# Patient Record
Sex: Female | Born: 1955 | Race: White | Hispanic: No | Marital: Married | Smoking: Never smoker
Health system: Southern US, Community
[De-identification: ages and names within clinical notes are randomized; demographics above are authoritative.]

## PROBLEM LIST (undated history)

## (undated) DIAGNOSIS — M3500 Sicca syndrome, unspecified: Secondary | ICD-10-CM

## (undated) DIAGNOSIS — E039 Hypothyroidism, unspecified: Secondary | ICD-10-CM

## (undated) DIAGNOSIS — K2970 Gastritis, unspecified, without bleeding: Secondary | ICD-10-CM

## (undated) DIAGNOSIS — E785 Hyperlipidemia, unspecified: Secondary | ICD-10-CM

## (undated) DIAGNOSIS — K859 Acute pancreatitis without necrosis or infection, unspecified: Secondary | ICD-10-CM

## (undated) DIAGNOSIS — F419 Anxiety disorder, unspecified: Secondary | ICD-10-CM

## (undated) DIAGNOSIS — K900 Celiac disease: Secondary | ICD-10-CM

## (undated) DIAGNOSIS — T4145XA Adverse effect of unspecified anesthetic, initial encounter: Secondary | ICD-10-CM

## (undated) DIAGNOSIS — F329 Major depressive disorder, single episode, unspecified: Secondary | ICD-10-CM

## (undated) DIAGNOSIS — G43909 Migraine, unspecified, not intractable, without status migrainosus: Secondary | ICD-10-CM

## (undated) DIAGNOSIS — T8859XA Other complications of anesthesia, initial encounter: Secondary | ICD-10-CM

## (undated) DIAGNOSIS — M797 Fibromyalgia: Secondary | ICD-10-CM

## (undated) DIAGNOSIS — K224 Dyskinesia of esophagus: Secondary | ICD-10-CM

## (undated) DIAGNOSIS — J45909 Unspecified asthma, uncomplicated: Secondary | ICD-10-CM

## (undated) DIAGNOSIS — C73 Malignant neoplasm of thyroid gland: Secondary | ICD-10-CM

## (undated) DIAGNOSIS — J449 Chronic obstructive pulmonary disease, unspecified: Secondary | ICD-10-CM

## (undated) DIAGNOSIS — K589 Irritable bowel syndrome without diarrhea: Secondary | ICD-10-CM

## (undated) DIAGNOSIS — M1990 Unspecified osteoarthritis, unspecified site: Secondary | ICD-10-CM

## (undated) DIAGNOSIS — Z9189 Other specified personal risk factors, not elsewhere classified: Secondary | ICD-10-CM

## (undated) DIAGNOSIS — K219 Gastro-esophageal reflux disease without esophagitis: Secondary | ICD-10-CM

## (undated) DIAGNOSIS — Z1371 Encounter for nonprocreative screening for genetic disease carrier status: Secondary | ICD-10-CM

## (undated) DIAGNOSIS — Z9109 Other allergy status, other than to drugs and biological substances: Secondary | ICD-10-CM

## (undated) DIAGNOSIS — Z803 Family history of malignant neoplasm of breast: Secondary | ICD-10-CM

## (undated) DIAGNOSIS — F32A Depression, unspecified: Secondary | ICD-10-CM

## (undated) HISTORY — PX: BREAST CYST ASPIRATION: SHX578

## (undated) HISTORY — DX: Migraine, unspecified, not intractable, without status migrainosus: G43.909

## (undated) HISTORY — PX: COLONOSCOPY WITH ESOPHAGOGASTRODUODENOSCOPY (EGD): SHX5779

## (undated) HISTORY — PX: NASAL SINUS SURGERY: SHX719

## (undated) HISTORY — DX: Encounter for nonprocreative screening for genetic disease carrier status: Z13.71

## (undated) HISTORY — DX: Other allergy status, other than to drugs and biological substances: Z91.09

## (undated) HISTORY — DX: Unspecified asthma, uncomplicated: J45.909

## (undated) HISTORY — PX: ESOPHAGOGASTRODUODENOSCOPY: SHX1529

## (undated) HISTORY — PX: BLADDER REPAIR: SHX76

## (undated) HISTORY — PX: EYE SURGERY: SHX253

## (undated) HISTORY — DX: Family history of malignant neoplasm of breast: Z80.3

## (undated) HISTORY — PX: THYROID LOBECTOMY: SHX420

## (undated) HISTORY — DX: Chronic obstructive pulmonary disease, unspecified: J44.9

## (undated) HISTORY — PX: TOTAL THYROIDECTOMY: SHX2547

## (undated) HISTORY — DX: Other specified personal risk factors, not elsewhere classified: Z91.89

## (undated) SURGERY — BRONCHOSCOPY, FLEXIBLE
Anesthesia: Moderate Sedation | Laterality: Bilateral

---

## 1987-10-28 HISTORY — PX: CHOLECYSTECTOMY: SHX55

## 2002-10-27 HISTORY — PX: INCONTINENCE SURGERY: SHX676

## 2004-10-27 HISTORY — PX: ABDOMINAL HYSTERECTOMY: SHX81

## 2004-11-25 ENCOUNTER — Ambulatory Visit: Payer: Self-pay | Admitting: Pain Medicine

## 2004-12-03 ENCOUNTER — Ambulatory Visit: Payer: Self-pay | Admitting: Pain Medicine

## 2004-12-17 ENCOUNTER — Ambulatory Visit: Payer: Self-pay | Admitting: Physician Assistant

## 2004-12-30 ENCOUNTER — Emergency Department: Payer: Self-pay | Admitting: Emergency Medicine

## 2005-02-12 ENCOUNTER — Emergency Department (HOSPITAL_COMMUNITY): Admission: EM | Admit: 2005-02-12 | Discharge: 2005-02-12 | Payer: Self-pay | Admitting: Emergency Medicine

## 2005-02-18 ENCOUNTER — Inpatient Hospital Stay: Payer: Self-pay | Admitting: Endocrinology

## 2005-05-13 ENCOUNTER — Emergency Department: Payer: Self-pay | Admitting: Emergency Medicine

## 2005-07-24 ENCOUNTER — Ambulatory Visit: Payer: Self-pay | Admitting: Obstetrics & Gynecology

## 2005-08-04 ENCOUNTER — Ambulatory Visit: Payer: Self-pay | Admitting: Obstetrics & Gynecology

## 2005-09-26 ENCOUNTER — Ambulatory Visit: Payer: Self-pay | Admitting: Pain Medicine

## 2005-10-07 ENCOUNTER — Ambulatory Visit: Payer: Self-pay | Admitting: Pain Medicine

## 2005-10-30 ENCOUNTER — Ambulatory Visit: Payer: Self-pay | Admitting: Physician Assistant

## 2005-12-12 ENCOUNTER — Emergency Department: Payer: Self-pay | Admitting: Emergency Medicine

## 2006-03-04 ENCOUNTER — Ambulatory Visit: Payer: Self-pay | Admitting: General Surgery

## 2006-06-22 ENCOUNTER — Ambulatory Visit: Payer: Self-pay | Admitting: Pain Medicine

## 2006-06-25 ENCOUNTER — Ambulatory Visit: Payer: Self-pay | Admitting: Pain Medicine

## 2006-07-08 ENCOUNTER — Ambulatory Visit: Payer: Self-pay | Admitting: Pain Medicine

## 2006-07-11 ENCOUNTER — Emergency Department (HOSPITAL_COMMUNITY): Admission: EM | Admit: 2006-07-11 | Discharge: 2006-07-11 | Payer: Self-pay | Admitting: Emergency Medicine

## 2006-08-05 ENCOUNTER — Ambulatory Visit: Payer: Self-pay | Admitting: Pain Medicine

## 2006-08-28 ENCOUNTER — Ambulatory Visit: Payer: Self-pay | Admitting: General Surgery

## 2006-11-27 ENCOUNTER — Ambulatory Visit: Payer: Self-pay | Admitting: Gastroenterology

## 2007-02-24 ENCOUNTER — Ambulatory Visit: Payer: Self-pay | Admitting: Pain Medicine

## 2007-02-25 ENCOUNTER — Ambulatory Visit: Payer: Self-pay | Admitting: Pain Medicine

## 2007-03-02 ENCOUNTER — Ambulatory Visit: Payer: Self-pay | Admitting: Pain Medicine

## 2007-07-02 ENCOUNTER — Emergency Department (HOSPITAL_COMMUNITY): Admission: EM | Admit: 2007-07-02 | Discharge: 2007-07-02 | Payer: Self-pay | Admitting: Emergency Medicine

## 2007-08-20 ENCOUNTER — Ambulatory Visit: Payer: Self-pay | Admitting: Physician Assistant

## 2007-09-07 ENCOUNTER — Ambulatory Visit: Payer: Self-pay | Admitting: Pain Medicine

## 2007-09-21 ENCOUNTER — Ambulatory Visit: Payer: Self-pay | Admitting: Physician Assistant

## 2007-10-14 ENCOUNTER — Ambulatory Visit: Payer: Self-pay | Admitting: Obstetrics & Gynecology

## 2007-10-18 ENCOUNTER — Ambulatory Visit: Payer: Self-pay | Admitting: Physician Assistant

## 2007-12-01 ENCOUNTER — Ambulatory Visit: Payer: Self-pay | Admitting: Gastroenterology

## 2007-12-07 ENCOUNTER — Ambulatory Visit: Payer: Self-pay | Admitting: Gastroenterology

## 2008-01-22 ENCOUNTER — Emergency Department (HOSPITAL_COMMUNITY): Admission: EM | Admit: 2008-01-22 | Discharge: 2008-01-23 | Payer: Self-pay | Admitting: Emergency Medicine

## 2008-05-26 ENCOUNTER — Ambulatory Visit: Payer: Self-pay | Admitting: Endocrinology

## 2008-07-26 ENCOUNTER — Ambulatory Visit: Payer: Self-pay | Admitting: Pain Medicine

## 2008-08-01 ENCOUNTER — Ambulatory Visit: Payer: Self-pay | Admitting: Pain Medicine

## 2008-09-05 ENCOUNTER — Ambulatory Visit: Payer: Self-pay | Admitting: Physician Assistant

## 2008-09-14 ENCOUNTER — Ambulatory Visit: Payer: Self-pay | Admitting: Pain Medicine

## 2008-12-12 ENCOUNTER — Emergency Department (HOSPITAL_COMMUNITY): Admission: EM | Admit: 2008-12-12 | Discharge: 2008-12-12 | Payer: Self-pay | Admitting: Emergency Medicine

## 2009-03-20 ENCOUNTER — Ambulatory Visit: Payer: Self-pay | Admitting: Obstetrics & Gynecology

## 2009-04-24 ENCOUNTER — Encounter (HOSPITAL_COMMUNITY): Admission: RE | Admit: 2009-04-24 | Discharge: 2009-05-24 | Payer: Self-pay

## 2009-07-02 ENCOUNTER — Emergency Department (HOSPITAL_COMMUNITY): Admission: EM | Admit: 2009-07-02 | Discharge: 2009-07-03 | Payer: Self-pay | Admitting: Emergency Medicine

## 2009-07-25 ENCOUNTER — Ambulatory Visit: Payer: Self-pay | Admitting: Physician Assistant

## 2009-08-14 ENCOUNTER — Ambulatory Visit: Payer: Self-pay | Admitting: Pain Medicine

## 2009-09-04 ENCOUNTER — Ambulatory Visit: Payer: Self-pay | Admitting: Physician Assistant

## 2010-04-02 ENCOUNTER — Ambulatory Visit: Payer: Self-pay | Admitting: Obstetrics & Gynecology

## 2011-01-24 ENCOUNTER — Emergency Department (HOSPITAL_COMMUNITY)
Admission: EM | Admit: 2011-01-24 | Discharge: 2011-01-24 | Disposition: A | Discharge status: Home / Self Care | Payer: Medicare Other | Attending: Emergency Medicine | Admitting: Emergency Medicine

## 2011-01-24 DIAGNOSIS — M129 Arthropathy, unspecified: Secondary | ICD-10-CM | POA: Insufficient documentation

## 2011-01-24 DIAGNOSIS — IMO0001 Reserved for inherently not codable concepts without codable children: Secondary | ICD-10-CM | POA: Insufficient documentation

## 2011-01-24 DIAGNOSIS — M25559 Pain in unspecified hip: Secondary | ICD-10-CM | POA: Insufficient documentation

## 2011-02-11 LAB — BASIC METABOLIC PANEL
BUN: 16 mg/dL (ref 6–23)
CO2: 23 mEq/L (ref 19–32)
Chloride: 108 mEq/L (ref 96–112)
GFR calc Af Amer: >60 mL/min (ref >60)
GFR calc non Af Amer: >60 mL/min (ref >60)
Potassium: 4 mEq/L (ref 3.5–5.1)

## 2011-02-11 LAB — DIFFERENTIAL
Monocytes Relative: 6 % (ref 3–12)
Neutro Abs: 6.2 10*3/uL (ref 1.7–7.7)
Neutrophils Relative %: 63 % (ref 43–77)

## 2011-02-11 LAB — POCT CARDIAC MARKERS
CKMB, poc: <1 ng/mL — ABNORMAL LOW (ref 1.0–8.0)
Myoglobin, poc: 68.4 ng/mL (ref 12–200)
Troponin i, poc: <0.05 ng/mL (ref 0.00–0.09)

## 2011-02-11 LAB — CBC
MCHC: 34.2 g/dL (ref 30.0–36.0)
Platelets: 301 10*3/uL (ref 150–400)
RDW: 13.4 % (ref 11.5–15.5)

## 2011-02-19 ENCOUNTER — Ambulatory Visit: Payer: Self-pay | Admitting: Pain Medicine

## 2011-04-04 ENCOUNTER — Ambulatory Visit: Payer: Self-pay | Admitting: Obstetrics & Gynecology

## 2011-07-14 ENCOUNTER — Ambulatory Visit: Payer: Self-pay | Admitting: Pain Medicine

## 2011-07-14 ENCOUNTER — Other Ambulatory Visit: Payer: Self-pay | Admitting: Pain Medicine

## 2011-07-17 ENCOUNTER — Ambulatory Visit: Payer: Self-pay | Admitting: Pain Medicine

## 2011-07-21 LAB — CBC
HCT: 37.6
Hemoglobin: 13.1
MCHC: 34.7
MCV: 88.1
Platelets: 293
RBC: 4.27
RDW: 13.5
WBC: 6.7

## 2011-07-21 LAB — DIFFERENTIAL
Basophils Absolute: 0
Basophils Relative: 0
Eosinophils Absolute: 0.3
Eosinophils Relative: 4
Lymphocytes Relative: 37
Lymphs Abs: 2.5
Monocytes Absolute: 0.5
Monocytes Relative: 8
Neutro Abs: 3.4
Neutrophils Relative %: 51

## 2011-07-21 LAB — BASIC METABOLIC PANEL
BUN: 12
Creatinine, Ser: 0.99
GFR calc Af Amer: >60
GFR calc non Af Amer: 59 — ABNORMAL LOW
Potassium: 3.9

## 2011-07-21 LAB — AMYLASE: Amylase: 17 — ABNORMAL LOW

## 2011-07-21 LAB — LIPASE, BLOOD: Lipase: 23

## 2011-08-04 ENCOUNTER — Ambulatory Visit: Payer: Self-pay | Admitting: Pain Medicine

## 2011-08-21 ENCOUNTER — Ambulatory Visit: Payer: Self-pay | Admitting: Gastroenterology

## 2011-09-09 ENCOUNTER — Ambulatory Visit: Payer: Self-pay | Admitting: Gastroenterology

## 2011-09-11 LAB — PATHOLOGY REPORT

## 2012-02-10 ENCOUNTER — Emergency Department (HOSPITAL_COMMUNITY)
Admission: EM | Admit: 2012-02-10 | Discharge: 2012-02-11 | Disposition: A | Discharge status: Home / Self Care | Payer: Medicare Other | Attending: Emergency Medicine | Admitting: Emergency Medicine

## 2012-02-10 ENCOUNTER — Emergency Department (HOSPITAL_COMMUNITY): Payer: Medicare Other

## 2012-02-10 ENCOUNTER — Encounter (HOSPITAL_COMMUNITY): Payer: Self-pay | Admitting: Emergency Medicine

## 2012-02-10 DIAGNOSIS — R109 Unspecified abdominal pain: Secondary | ICD-10-CM | POA: Insufficient documentation

## 2012-02-10 DIAGNOSIS — R51 Headache: Secondary | ICD-10-CM | POA: Insufficient documentation

## 2012-02-10 DIAGNOSIS — K5900 Constipation, unspecified: Secondary | ICD-10-CM | POA: Insufficient documentation

## 2012-02-10 DIAGNOSIS — Z9089 Acquired absence of other organs: Secondary | ICD-10-CM | POA: Insufficient documentation

## 2012-02-10 DIAGNOSIS — R142 Eructation: Secondary | ICD-10-CM | POA: Insufficient documentation

## 2012-02-10 DIAGNOSIS — R141 Gas pain: Secondary | ICD-10-CM | POA: Insufficient documentation

## 2012-02-10 DIAGNOSIS — R10819 Abdominal tenderness, unspecified site: Secondary | ICD-10-CM | POA: Insufficient documentation

## 2012-02-10 HISTORY — DX: Unspecified osteoarthritis, unspecified site: M19.90

## 2012-02-10 HISTORY — DX: Fibromyalgia: M79.7

## 2012-02-10 HISTORY — DX: Celiac disease: K90.0

## 2012-02-10 HISTORY — DX: Hyperlipidemia, unspecified: E78.5

## 2012-02-10 MED ORDER — SODIUM CHLORIDE 0.9 % IV BOLUS (SEPSIS)
1000.0000 mL | Freq: Once | INTRAVENOUS | Status: AC
  Administered 2012-02-10: 1000 mL via INTRAVENOUS

## 2012-02-10 MED ORDER — HYDROMORPHONE HCL PF 1 MG/ML IJ SOLN
1.0000 mg | INTRAMUSCULAR | Status: DC | PRN
  Administered 2012-02-11 (×2): 1 mg via INTRAVENOUS
  Filled 2012-02-10 (×2): qty 1

## 2012-02-10 MED ORDER — ONDANSETRON HCL 4 MG/2ML IJ SOLN
4.0000 mg | Freq: Once | INTRAMUSCULAR | Status: AC
  Administered 2012-02-11: 4 mg via INTRAVENOUS
  Filled 2012-02-10: qty 2

## 2012-02-10 NOTE — ED Notes (Signed)
Pt with constipation, nausea and vomiting.  Pt with complaint of headache as well as abdominal pain.  Pt does have distention

## 2012-02-10 NOTE — ED Provider Notes (Signed)
History     CSN: [id number]  Arrival date & time 02/10/12  2145   First MD Initiated Contact with Patient 02/10/12 2309      Chief Complaint  Patient presents with  . Constipation  . Nausea  . Headache  . Abdominal Pain    (Consider location/radiation/quality/duration/timing/severity/associated sxs/prior treatment) HPI Comments: 56 year old female with a history of celiac sprue as well as Sjogren's syndrome, fibromyalgia, migraine and hyperlipidemia. She presents with a complaint of abdominal pain which started in the last 2 weeks, was associated with constipation however after taking laxatives and enemas she had relief and had voluminous watery diarrhea 2 days ago. Since that time her symptoms have recurred in her abdomen has become very swollen and tender it is associated with nausea but no vomiting, no bowel movements and no flatus. The symptoms are persistent, gradually getting worse and not associated with fevers chills coughing shortness of breath rashes or swelling. She denies any rectal bleeding.  Patient is a 56 y.o. female presenting with constipation, headaches, and abdominal pain. The history is provided by the patient and the spouse.  Constipation  Associated symptoms include abdominal pain and headaches.  Headache   Abdominal Pain The primary symptoms of the illness include abdominal pain.  Additional symptoms associated with the illness include constipation.    Past Medical History  Diagnosis Date  . Celiac sprue   . Thyroid disease   . Cancer     thyroid  . Fibromyalgia   . Arthritis   . Hyperlipemia   . Migraine     Past Surgical History  Procedure Date  . Cholecystectomy   . Abdominal hysterectomy   . Thyroid lobectomy   . Bladder repair     No family history on file.  History  Substance Use Topics  . Smoking status: Never Smoker   . Smokeless tobacco: Not on file  . Alcohol Use: No    OB History    Grav Para Term Preterm Abortions TAB SAB  Ect Mult Living                  Review of Systems  Gastrointestinal: Positive for abdominal pain and constipation.  Neurological: Positive for headaches.  All other systems reviewed and are negative.    Allergies  Aspirin; Vioxx; Celebrex; and Reglan  Home Medications   Current Outpatient Rx  Name Route Sig Dispense Refill  . ALBUTEROL SULFATE HFA 108 (90 BASE) MCG/ACT IN AERS Inhalation Inhale 2 puffs into the lungs every 6 (six) hours as needed.    Rulon Sera 18-103 MCG/ACT IN AERO Inhalation Inhale 2 puffs into the lungs every 6 (six) hours as needed.    . ALPRAZOLAM 0.5 MG PO TABS Oral Take 0.5 mg by mouth 4 (four) times daily as needed. FOR ANXIETY    . AMITRIPTYLINE HCL 25 MG PO TABS Oral Take 25 mg by mouth at bedtime.    Marland Kitchen LACRI-LUBE OP Ophthalmic Apply 1 drop to eye at bedtime.    . ATORVASTATIN CALCIUM 20 MG PO TABS Oral Take 20 mg by mouth every evening.    Marland Kitchen BENZOCAINE 20 % MT GEL Mouth/Throat Use as directed 1 application in the mouth or throat 2 (two) times daily as needed.    Marland Kitchen CALCIUM CARBONATE ANTACID 500 MG PO CHEW Oral Chew 1 tablet by mouth daily as needed.    Marland Kitchen VITAMIN D 1000 UNITS PO TABS Oral Take 2,000 Units by mouth every morning.    Marland Kitchen ESTROGENS,  CONJUGATED 0.625 MG/GM VA CREA Vaginal Place 2.75 g vaginally 3 (three) times a week.    Marland Kitchen ELETRIPTAN HYDROBROMIDE 40 MG PO TABS Oral One tablet by mouth at onset of headache. May repeat in 2 hours if headache persists or recurs. may repeat in 2 hours if necessary    . OMEGA-3 FATTY ACIDS 1000 MG PO CAPS Oral Take 3 g by mouth every morning.    Marland Kitchen GABAPENTIN 600 MG PO TABS Oral Take 600 mg by mouth daily as needed.    Marland Kitchen HYOSCYAMINE SULFATE 0.125 MG PO TBDP Sublingual Place 0.125 mg under the tongue every 4 (four) hours as needed.    Marland Kitchen LEVOTHYROXINE SODIUM 112 MCG PO TABS Oral Take 112 mcg by mouth every morning.    Marland Kitchen MAGNESIUM HYDROXIDE 400 MG/5ML PO SUSP Oral Take by mouth daily as needed.    Marland Kitchen  METAXALONE 800 MG PO TABS Oral Take 800 mg by mouth at bedtime.    . ADULT MULTIVITAMIN W/MINERALS CH Oral Take 1 tablet by mouth every morning.    Marland Kitchen PANTOPRAZOLE SODIUM 40 MG PO TBEC Oral Take 40 mg by mouth every morning.    Marland Kitchen POLYETHYL GLYCOL-PROPYL GLYCOL 0.4-0.3 % OP SOLN Ophthalmic Apply 1 drop to eye daily.    Marland Kitchen POLYETHYLENE GLYCOL 3350 PO POWD Oral Take 17 g by mouth daily.    Marland Kitchen PHILLIPS COLON HEALTH PO Oral Take 1 tablet by mouth every morning.    Marland Kitchen SIMETHICONE 80 MG PO CHEW Oral Chew 80 mg by mouth every 6 (six) hours as needed. FOR RELIEF    . TOPIRAMATE 100 MG PO TABS Oral Take 100 mg by mouth every morning.    . VENLAFAXINE HCL ER 150 MG PO CP24 Oral Take 300 mg by mouth at bedtime.    Marland Kitchen BISACODYL 5 MG PO TBEC Oral Take 1 tablet (5 mg total) by mouth 2 (two) times daily. 14 tablet 0  . MAGNESIUM CITRATE PO SOLN Oral Take 296 mLs by mouth once. OTC 300 mL 0  . PREDNISONE 10 MG PO TABS Oral Take 10 mg by mouth as directed. 6,4,2 AS DIRECTED PER TITRATION DOSE      BP 120/80  Pulse 65  Temp(Src) 97.8 F (36.6 C) (Oral)  Resp 20  Ht 5' 5"  (1.651 m)  Wt 178 lb (80.74 kg)  BMI 29.62 kg/m2  SpO2 97%  Physical Exam  Nursing note and vitals reviewed. Constitutional: She appears well-developed and well-nourished.       Uncomfortable appearing  HENT:  Head: Normocephalic and atraumatic.  Mouth/Throat: No oropharyngeal exudate.       Mucous membranes dehydrated  Eyes: Conjunctivae and EOM are normal. Pupils are equal, round, and reactive to light. Right eye exhibits no discharge. Left eye exhibits no discharge. No scleral icterus.  Neck: Normal range of motion. Neck supple. No JVD present. No thyromegaly present.  Cardiovascular: Normal rate, regular rhythm, normal heart sounds and intact distal pulses.  Exam reveals no gallop and no friction rub.   No murmur heard. Pulmonary/Chest: Effort normal and breath sounds normal. No respiratory distress. She has no wheezes. She has no  rales.  Abdominal: Soft. She exhibits distension. She exhibits no mass. There is tenderness ( diffuse mild tenderness without guarding or peritoneal signs. No focal tenderness to the right lower quadrant, no rebound). There is no rebound and no guarding.       Decreased bowel sounds  Musculoskeletal: Normal range of motion. She exhibits no edema and  no tenderness.  Lymphadenopathy:    She has no cervical adenopathy.  Neurological: She is alert. Coordination normal.  Skin: Skin is warm and dry. No rash noted. No erythema.  Psychiatric: She has a normal mood and affect. Her behavior is normal.    ED Course  Procedures (including critical care time)  Labs Reviewed  COMPREHENSIVE METABOLIC PANEL - Abnormal; Notable for the following:    Total Bilirubin 0.2 (*)    GFR calc non Af Amer 74 (*)    GFR calc Af Amer 86 (*)    All other components within normal limits  URINALYSIS, ROUTINE W REFLEX MICROSCOPIC - Abnormal; Notable for the following:    Color, Urine STRAW (*)    Specific Gravity, Urine <1.005 (*)    All other components within normal limits  CBC  DIFFERENTIAL  LIPASE, BLOOD   Ct Abdomen Pelvis W Contrast  02/11/2012  *RADIOLOGY REPORT*  Clinical Data: Abdominal pain and distention.  Rule out obstruction.  Constipation, nausea and headache.  CT ABDOMEN AND PELVIS WITH CONTRAST  Technique:  Multidetector CT imaging of the abdomen and pelvis was performed following the standard protocol during bolus administration of intravenous contrast.  Contrast: 127m OMNIPAQUE IOHEXOL 300 MG/ML  SOLN  Comparison: CT abdomen pelvis 02/12/2005  Findings: The lung bases are clear.  Patient is status post cholecystectomy.  There is chronic intra and extrahepatic biliary ductal dilatation, unchanged compared to a CT of 2006.  The liver enhances normally.  No focal liver lesion.  The spleen, adrenal glands, and kidneys are within normal limits. There is some fatty replacement of the pancreas.  No  pancreatic mass or inflammatory changes seen.  Slight haziness in the central small bowel mesentery with a few mesenteric lymph nodes is less prominent compared to the study of 2006.  There is a prominent amount of stool in the colon.  Colon and small bowel loops are normal in caliber.  No bowel wall thickening is seen.  The abdominal aorta is normal in caliber contains scattered atherosclerotic calcification.  No pathologic lymphadenopathy.  No ascites.  Urinary bladder is decompressed and unremarkable. Hysterectomy.  No adnexal mass.  Two surgical clips in the anterior pelvis noted.  No acute bony abnormality.  IMPRESSION:  1.  Prominent amount of stool in the colon.  Question if the patient could have a history of constipation. 2.  Negative for bowel obstruction. 3.  Slight haziness in the central small bowel mesentery with a few small mesenteric lymph nodes noted.  This finding is less prominent compared to 2006, and likely chronic.  This can be seen in the setting of mesenteric lymphadenitis.  Other causes such as lymphoma are felt to be much less likely given the chronicity since 2006, and interval decrease over time. 4.  Stable intra and extrahepatic biliary ductal dilatation in this patient status cholecystectomy.  Original Report Authenticated By: SCurlene Dolphin M.D.     1. Abdominal pain   2. Constipation       MDM  The patient does have a history of cholecystectomy, hysterectomy and is scheduled for a CT scan in the morning at the request of her doctor to rule out other signs of abdominal problems. Due to her increased pain and nausea this evening we'll pursue the workup at this time instead, intravenous medications, fluids, CT scan.  CT scan reviewed by myself and in agreement with radiology I agree that there is constipation and had a stool burden but no other significant acute  findings.  I have personally reviewed the lab work and it shows that there is no dehydration with a specific  gravity of 1.005, normal blood counts, normal compressive metabolic panel.  These findings were discussed with the patient has expressed her understanding and will go home to use medications for constipation and followup with her doctor.  Discharge Prescriptions include:  #1 magnesium citrate #2 Dulcolax      Johnna Acosta, MD 02/11/12 [phone]

## 2012-02-10 NOTE — ED Notes (Signed)
Into room to see patient. States she has had abdominal swelling x 2 weeks but got worse today. Abdomen very distended. Hypoactive bowel sounds. Has been nauseated but no vomiting. Diffuse tenderness. To bathroom to obtain urine specimen clean catch. Family with patient.

## 2012-02-11 LAB — COMPREHENSIVE METABOLIC PANEL
ALT: 30 U/L (ref 0–35)
AST: 19 U/L (ref 0–37)
Albumin: 3.8 g/dL (ref 3.5–5.2)
Alkaline Phosphatase: 102 U/L (ref 39–117)
GFR calc Af Amer: 86 mL/min — ABNORMAL LOW (ref >90)
Glucose, Bld: 89 mg/dL (ref 70–99)
Potassium: 3.7 mEq/L (ref 3.5–5.1)
Sodium: 137 mEq/L (ref 135–145)
Total Protein: 7.6 g/dL (ref 6.0–8.3)

## 2012-02-11 LAB — URINALYSIS, ROUTINE W REFLEX MICROSCOPIC
Bilirubin Urine: NEGATIVE
Glucose, UA: NEGATIVE mg/dL
Hgb urine dipstick: NEGATIVE
Ketones, ur: NEGATIVE mg/dL
Specific Gravity, Urine: <1.005 — ABNORMAL LOW (ref 1.005–1.030)
pH: 6 (ref 5.0–8.0)

## 2012-02-11 LAB — CBC
MCH: 29.5 pg (ref 26.0–34.0)
Platelets: 309 10*3/uL (ref 150–400)
RBC: 4.64 MIL/uL (ref 3.87–5.11)
RDW: 14.2 % (ref 11.5–15.5)
WBC: 10.1 10*3/uL (ref 4.0–10.5)

## 2012-02-11 LAB — DIFFERENTIAL
Basophils Absolute: 0 10*3/uL (ref 0.0–0.1)
Eosinophils Absolute: 0.3 10*3/uL (ref 0.0–0.7)
Lymphs Abs: 3.7 10*3/uL (ref 0.7–4.0)
Neutrophils Relative %: 54 % (ref 43–77)

## 2012-02-11 MED ORDER — BISACODYL 5 MG PO TBEC: 5.0000 mg | DELAYED_RELEASE_TABLET | Freq: Two times a day (BID) | ORAL | Status: AC

## 2012-02-11 MED ORDER — ONDANSETRON HCL 4 MG/2ML IJ SOLN
4.0000 mg | Freq: Once | INTRAMUSCULAR | Status: AC
  Administered 2012-02-11: 4 mg via INTRAVENOUS
  Filled 2012-02-11: qty 2

## 2012-02-11 MED ORDER — IOHEXOL 300 MG/ML  SOLN
100.0000 mL | Freq: Once | INTRAMUSCULAR | Status: AC | PRN
  Administered 2012-02-11: 100 mL via INTRAVENOUS

## 2012-02-11 MED ORDER — MAGNESIUM CITRATE PO SOLN: 296.0000 mL | Freq: Once | ORAL | Status: AC

## 2012-02-11 NOTE — ED Notes (Signed)
Resting sitting up in bed. No distress. Equal chest rise and fall, regular, equal. Pain 3\10. No nausea. Will continue to monitor.

## 2012-02-11 NOTE — ED Notes (Signed)
Patient ambulatory with steady gait to bathroom. Consulted with MD regarding pain medication. States can given another dose dilaudid 70m. Nausea coming back as well.

## 2012-02-11 NOTE — ED Notes (Signed)
IV fluids not running properly via gravity or pump. Very positional. Brenda Normal, RN at bedside to attempt second line.

## 2012-02-11 NOTE — ED Notes (Signed)
Patient back to room from radiology/ pain 2/10. Denies nausea. Denies any needs at this time. Notified awaiting test results. Verbalized understanding. Family with patient. Call bell at bedside. Bed in low position and locked with side rails up.

## 2012-02-11 NOTE — ED Notes (Signed)
Remains resting sitting up in bed. Pain 3\10 at this time. States nausea is ok. Denies any needs. Call bell within reach. Family with patient. Will continue to monitor.

## 2012-02-11 NOTE — ED Notes (Signed)
Pain 3\10. Nausea is better. Resting comfortably in bed. No distress. Equal chest rise and fall, regular, unlabored. Normal saline infusing in left ac at 500 cc\hr. Infusing well with no signs of infiltration. Call bell within reach. Bed in low position and locked with side rails up. Family with patient.

## 2012-02-11 NOTE — ED Notes (Signed)
Resting in bed sitting up. Pain 2\10. Denies nausea. Denies any needs at this time. Bed in low position and locked with side rails up. No distress. Family with patient. Will continue to monitor.

## 2012-02-11 NOTE — ED Notes (Signed)
Patient states pain is 4\10 and would like something else for pain. Denies any other needs. Call bell and family at bedside.

## 2012-02-11 NOTE — ED Notes (Signed)
Patient to radiology via stretcher

## 2012-02-11 NOTE — Discharge Instructions (Signed)
You have been diagnosed with undifferentiated abdominal pain.  Abdominal pain can be caused by many things. Your caregiver evaluates the seriousness of your pain by an examination and possibly blood or urine tests and imaging (CT scan, x-rays, ultrasound). Many cases can be observed and treated at home after initial evaluation in the emergency department. Even though you are being discharged home, abdominal pain can be unpredictable. Therefore, you need a repeat exam if your pain does not resolve, returns, or worsens. Most patient's with abdominal pain do not need to be admitted to the hospital or have surgery, but serious problems like appendicitis and gallbladder attacks can start out as nonspecific pain. Many abdominal conditions cannot be diagnosed in 1 visit, so followup evaluations are very important.  Seek immediate medical attention if:  *The pain does not go away or becomes severe. *Temperature above 101 develops *Repeated vomiting occurs(multiple episodes) *The pain becomes localized to portions of the abdomen. The right side could possibly be appendicitis. In an adult, the left lower portion of the abdomen could be colitis or diverticulitis. *Blood is being passed in stools or vomit *Return also if you develop chest pain, difficulty breathing, dizziness or fainting, or become confused poorly responsive or inconsolable (young children).     If you do not have a physician, you should reference the below phone numbers and call in the morning to establish follow up care.  RESOURCE GUIDE  Dental Problems  Patients with Medicaid: [employer] [facility] [employer] Phone:  [phone]                                                  Phone:  [phone]  If unable to pay or uninsured, contact:  [facility] or [facility] to become qualified for the adult dental  clinic.  Chronic Pain Problems Contact [facility]  [phone] Patients need to be referred by their primary care doctor.  Insufficient Money for Medicine Contact [employer]:  call "211" or [place] [phone].  No Primary Care Doctor Call [facility]  [phone] Other agencies that provide inexpensive medical care    [place]  [phone]    [facility]  [place]  [phone]    [facility]  [phone]    [facility]  [place]  [place]  [phone] [place]   [phone] (emergency services [phone])  Substance Abuse [facility]  [phone] [facility] [phone] [facility] [place] [phone] [facility] [phone] Residential & Outpatient Substance Abuse Program  [phone]  Abuse/Neglect [place] [phone] [place] [phone] (After Hours)  Emergency [place] [phone]  [facility] [place] [phone] [facility]  Services [phone]  MRSA Hotline #:   912-547-3050    [facility] Dept. [address]      [place] Phone:  [phone]                                   Phone:  [phone]                 Phone:  [place] Phone:  [place] [phone] (After Hours)

## 2012-02-11 NOTE — ED Notes (Signed)
MD at bedside to speak with patient about plan of care and test results.

## 2013-01-21 ENCOUNTER — Emergency Department (HOSPITAL_COMMUNITY): Payer: Medicare Other

## 2013-01-21 ENCOUNTER — Encounter (HOSPITAL_COMMUNITY): Payer: Self-pay | Admitting: *Deleted

## 2013-01-21 ENCOUNTER — Emergency Department (HOSPITAL_COMMUNITY)
Admission: EM | Admit: 2013-01-21 | Discharge: 2013-01-22 | Disposition: A | Discharge status: Home / Self Care | Payer: Medicare Other | Attending: Emergency Medicine | Admitting: Emergency Medicine

## 2013-01-21 DIAGNOSIS — G43909 Migraine, unspecified, not intractable, without status migrainosus: Secondary | ICD-10-CM | POA: Insufficient documentation

## 2013-01-21 DIAGNOSIS — IMO0002 Reserved for concepts with insufficient information to code with codable children: Secondary | ICD-10-CM | POA: Insufficient documentation

## 2013-01-21 DIAGNOSIS — E079 Disorder of thyroid, unspecified: Secondary | ICD-10-CM | POA: Insufficient documentation

## 2013-01-21 DIAGNOSIS — Z8739 Personal history of other diseases of the musculoskeletal system and connective tissue: Secondary | ICD-10-CM | POA: Insufficient documentation

## 2013-01-21 DIAGNOSIS — Z79899 Other long term (current) drug therapy: Secondary | ICD-10-CM | POA: Insufficient documentation

## 2013-01-21 DIAGNOSIS — R1013 Epigastric pain: Secondary | ICD-10-CM | POA: Insufficient documentation

## 2013-01-21 DIAGNOSIS — R42 Dizziness and giddiness: Secondary | ICD-10-CM | POA: Insufficient documentation

## 2013-01-21 DIAGNOSIS — E785 Hyperlipidemia, unspecified: Secondary | ICD-10-CM | POA: Insufficient documentation

## 2013-01-21 DIAGNOSIS — Z90710 Acquired absence of both cervix and uterus: Secondary | ICD-10-CM | POA: Insufficient documentation

## 2013-01-21 DIAGNOSIS — Z87442 Personal history of urinary calculi: Secondary | ICD-10-CM | POA: Insufficient documentation

## 2013-01-21 DIAGNOSIS — Z85850 Personal history of malignant neoplasm of thyroid: Secondary | ICD-10-CM | POA: Insufficient documentation

## 2013-01-21 DIAGNOSIS — Z8719 Personal history of other diseases of the digestive system: Secondary | ICD-10-CM | POA: Insufficient documentation

## 2013-01-21 DIAGNOSIS — R109 Unspecified abdominal pain: Secondary | ICD-10-CM

## 2013-01-21 HISTORY — DX: Acute pancreatitis without necrosis or infection, unspecified: K85.90

## 2013-01-21 LAB — COMPREHENSIVE METABOLIC PANEL
AST: 21 U/L (ref 0–37)
Albumin: 4 g/dL (ref 3.5–5.2)
Alkaline Phosphatase: 125 U/L — ABNORMAL HIGH (ref 39–117)
BUN: 18 mg/dL (ref 6–23)
CO2: 24 mEq/L (ref 19–32)
Chloride: 103 mEq/L (ref 96–112)
Creatinine, Ser: 0.98 mg/dL (ref 0.50–1.10)
GFR calc non Af Amer: 63 mL/min — ABNORMAL LOW (ref >90)
Potassium: 4.2 mEq/L (ref 3.5–5.1)
Total Bilirubin: 0.3 mg/dL (ref 0.3–1.2)

## 2013-01-21 LAB — URINALYSIS, ROUTINE W REFLEX MICROSCOPIC
Glucose, UA: NEGATIVE mg/dL
Hgb urine dipstick: NEGATIVE
Ketones, ur: NEGATIVE mg/dL
Protein, ur: NEGATIVE mg/dL
Urobilinogen, UA: 0.2 mg/dL (ref 0.0–1.0)

## 2013-01-21 LAB — CBC WITH DIFFERENTIAL/PLATELET
Basophils Absolute: 0 10*3/uL (ref 0.0–0.1)
Basophils Relative: 0 % (ref 0–1)
HCT: 40.7 % (ref 36.0–46.0)
Hemoglobin: 13.5 g/dL (ref 12.0–15.0)
Lymphocytes Relative: 31 % (ref 12–46)
Monocytes Absolute: 0.5 10*3/uL (ref 0.1–1.0)
Monocytes Relative: 6 % (ref 3–12)
Neutro Abs: 4.9 10*3/uL (ref 1.7–7.7)
Neutrophils Relative %: 58 % (ref 43–77)
RBC: 4.52 MIL/uL (ref 3.87–5.11)
WBC: 8.5 10*3/uL (ref 4.0–10.5)

## 2013-01-21 LAB — LIPASE, BLOOD: Lipase: 15 U/L (ref 11–59)

## 2013-01-21 LAB — AMYLASE: Amylase: 14 U/L (ref 0–105)

## 2013-01-21 MED ORDER — SODIUM CHLORIDE 0.9 % IV SOLN
1000.0000 mL | Freq: Once | INTRAVENOUS | Status: AC
  Administered 2013-01-21: 1000 mL via INTRAVENOUS

## 2013-01-21 MED ORDER — SODIUM CHLORIDE 0.9 % IV SOLN: 1000.0000 mL | INTRAVENOUS | Status: DC

## 2013-01-21 MED ORDER — ONDANSETRON HCL 4 MG/2ML IJ SOLN
4.0000 mg | Freq: Once | INTRAMUSCULAR | Status: AC
  Administered 2013-01-21: 4 mg via INTRAVENOUS
  Filled 2013-01-21: qty 2

## 2013-01-21 MED ORDER — HYDROMORPHONE HCL PF 1 MG/ML IJ SOLN
1.0000 mg | Freq: Once | INTRAMUSCULAR | Status: AC
  Administered 2013-01-21: 1 mg via INTRAVENOUS
  Filled 2013-01-21: qty 1

## 2013-01-21 MED ORDER — HYDROCODONE-ACETAMINOPHEN 5-325 MG PO TABS: 1.0000 | ORAL_TABLET | ORAL | Status: DC | PRN

## 2013-01-21 MED ORDER — HYDROMORPHONE HCL PF 1 MG/ML IJ SOLN
1.0000 mg | Freq: Once | INTRAMUSCULAR | Status: DC
  Filled 2013-01-21 (×2): qty 1

## 2013-01-21 NOTE — ED Notes (Signed)
Pt states abdominal pain x3 days, states she has has pancreatitis in the past. States she has "stomach attacks" from time to time when she eats certain foods as she has celiacs disease. States 2 days ago she drank a bottle of mag citrate to clean herself out.

## 2013-01-21 NOTE — ED Notes (Signed)
abd pain, took citrate of magnesia on Wednesday,  Took some of husbands  Pain med today .  Hx of pancreatitis  Nausea, no vomiting,

## 2013-01-21 NOTE — ED Provider Notes (Signed)
History    This chart was scribed for Susan Apo, MD by Shona Needles, ED Scribe. The patient was seen in room APA09/APA09. Patient's care was started at 2013.  CSN: [id number]  Arrival date & time 01/21/13  2013   None     Chief Complaint  Patient presents with  . Abdominal Pain   Patient is a 57 y.o. female presenting with abdominal pain. The history is provided by the patient. No language interpreter was used.  Abdominal Pain   Susan [name] is a 57 y.o. female with h/o Migraine, pancreatitis, and cancer, who presents to the Emergency Department complaining of 1 week ofgradual onset, intermittent severe diffuse abdominal pain with after eating. Pain is described as "solid excruciating ache," similar to that of pancreatitis, and mildly radiating posteriorly. Pt reports multiple episodes throughout the week that occurred 2 hours after eating. The first episode subsided after taking Magnesium sulfate. The last episode began 6 hours ago, and included associated light headedness. No fever, earache, sore throat, cough, chest pain, difficulty urinating, rash, or syncope. Surgical hx includes hysterectomy, thyroid lobectomy, and cholecystectomy. Current medications include Minocycline which is her "4th cycle of antibiotics."  PCP listed as Dr. Ronnald Collum   Past Medical History  Diagnosis Date  . Celiac sprue   . Thyroid disease   . Fibromyalgia   . Arthritis   . Hyperlipemia   . Migraine   . Cancer     thyroid  . Pancreatitis     Past Surgical History  Procedure Laterality Date  . Cholecystectomy    . Abdominal hysterectomy    . Thyroid lobectomy    . Bladder repair      History reviewed. No pertinent family history.  History  Substance Use Topics  . Smoking status: Never Smoker   . Smokeless tobacco: Not on file  . Alcohol Use: No    OB History   Grav Para Term Preterm Abortions TAB SAB Ect Mult Living                  Review of Systems  Gastrointestinal:  Positive for abdominal pain.  Neurological: Positive for light-headedness.  All other systems reviewed and are negative.    Allergies  Aspirin; Vioxx; Celebrex; and Metoclopramide hcl  Home Medications   Current Outpatient Rx  Name  Route  Sig  Dispense  Refill  . ALPRAZolam (XANAX) 0.5 MG tablet   Oral   Take 0.5 mg by mouth 4 (four) times daily as needed. FOR ANXIETY         . amitriptyline (ELAVIL) 25 MG tablet   Oral   Take 25 mg by mouth at bedtime.         Marland Kitchen atorvastatin (LIPITOR) 20 MG tablet   Oral   Take 20 mg by mouth every evening.         . benzocaine (ORAJEL MOUTH-AID) 20 % GEL   Mouth/Throat   Use as directed 1 application in the mouth or throat at bedtime.          . cholecalciferol (VITAMIN D) 1000 UNITS tablet   Oral   Take 2,000 Units by mouth every morning.         . conjugated estrogens (PREMARIN) vaginal cream   Vaginal   Place 2.75 g vaginally 3 (three) times a week.         . eletriptan (RELPAX) 40 MG tablet   Oral   One tablet by mouth at onset of headache.  May repeat in 2 hours if headache persists or recurs. may repeat in 2 hours if necessary         . fish oil-omega-3 fatty acids 1000 MG capsule   Oral   Take 1 g by mouth at bedtime.          . fluticasone (FLONASE) 50 MCG/ACT nasal spray   Nasal   Place 2 sprays into the nose daily.         Marland Kitchen gabapentin (NEURONTIN) 600 MG tablet   Oral   Take 600 mg by mouth at bedtime.          . hyoscyamine (NULEV) 0.125 MG TBDP   Sublingual   Place 0.125 mg under the tongue every 4 (four) hours as needed.         Marland Kitchen levothyroxine (SYNTHROID, LEVOTHROID) 112 MCG tablet   Oral   Take 112 mcg by mouth every morning.          . Multiple Vitamin (MULITIVITAMIN WITH MINERALS) TABS   Oral   Take 1 tablet by mouth every morning.          . pantoprazole (PROTONIX) 40 MG tablet   Oral   Take 40 mg by mouth every morning.          Vladimir Faster Glycol-Propyl Glycol  (SYSTANE) 0.4-0.3 % SOLN   Ophthalmic   Apply 1 drop to eye 2 (two) times daily.          . Probiotic Product (PHILLIPS COLON HEALTH PO)   Oral   Take 1 tablet by mouth every morning.          . simethicone (GAS-X) 80 MG chewable tablet   Oral   Chew 80 mg by mouth every 6 (six) hours as needed. FOR RELIEF         . topiramate (TOPAMAX) 100 MG tablet   Oral   Take 100 mg by mouth every morning.          . venlafaxine XR (EFFEXOR-XR) 150 MG 24 hr capsule   Oral   Take 300 mg by mouth at bedtime.           BP 101/76  Pulse 85  Temp(Src) 98.2 F (36.8 C) (Oral)  Resp 20  Ht 5' 5"  (1.651 m)  Wt 180 lb (81.647 kg)  BMI 29.95 kg/m2  SpO2 99%  Physical Exam  Nursing note and vitals reviewed. Constitutional: She is oriented to person, place, and time. She appears well-developed and well-nourished. No distress.  HENT:  Head: Normocephalic and atraumatic.  Eyes: Conjunctivae and EOM are normal.  Neck: Neck supple. No tracheal deviation present.  Cardiovascular: Normal rate.   Pulmonary/Chest: Effort normal and breath sounds normal. No respiratory distress.  Abdominal: Soft. Bowel sounds are normal. She exhibits distension. She exhibits no mass. There is no guarding.  Epigastric with radiation to RLQ.  Musculoskeletal: Normal range of motion.  Lymphadenopathy:    She has no cervical adenopathy.  Neurological: She is alert and oriented to person, place, and time. No sensory deficit.  Skin: Skin is dry.  Psychiatric: She has a normal mood and affect. Her behavior is normal.    ED Course  Procedures DIAGNOSTIC STUDIES: Oxygen Saturation is 99% on room air, normal by my interpretation.    COORDINATION OF CARE: 21:43- Evaluated Pt. Pt is awake, alert, and without distress. 21:49- Patient understand and agree with initial ED impression and plan with expectations set for ED visit. 21:57- Ordered DG Abd  Acute W/Chest 1 time imaging.  11:10 PM Results for orders  placed during the hospital encounter of 01/21/13  AMYLASE      Result Value Range   Amylase 14  0 - 105 U/L  LIPASE, BLOOD      Result Value Range   Lipase 15  11 - 59 U/L  CBC WITH DIFFERENTIAL      Result Value Range   WBC 8.5  4.0 - 10.5 K/uL   RBC 4.52  3.87 - 5.11 MIL/uL   Hemoglobin 13.5  12.0 - 15.0 g/dL   HCT 40.7  36.0 - 46.0 %   MCV 90.0  78.0 - 100.0 fL   MCH 29.9  26.0 - 34.0 pg   MCHC 33.2  30.0 - 36.0 g/dL   RDW 14.6  11.5 - 15.5 %   Platelets 310  150 - 400 K/uL   Neutrophils Relative 58  43 - 77 %   Neutro Abs 4.9  1.7 - 7.7 K/uL   Lymphocytes Relative 31  12 - 46 %   Lymphs Abs 2.6  0.7 - 4.0 K/uL   Monocytes Relative 6  3 - 12 %   Monocytes Absolute 0.5  0.1 - 1.0 K/uL   Eosinophils Relative 5  0 - 5 %   Eosinophils Absolute 0.4  0.0 - 0.7 K/uL   Basophils Relative 0  0 - 1 %   Basophils Absolute 0.0  0.0 - 0.1 K/uL  COMPREHENSIVE METABOLIC PANEL      Result Value Range   Sodium 138  135 - 145 mEq/L   Potassium 4.2  3.5 - 5.1 mEq/L   Chloride 103  96 - 112 mEq/L   CO2 24  19 - 32 mEq/L   Glucose, Bld 90  70 - 99 mg/dL   BUN 18  6 - 23 mg/dL   Creatinine, Ser 0.98  0.50 - 1.10 mg/dL   Calcium 9.7  8.4 - 10.5 mg/dL   Total Protein 8.0  6.0 - 8.3 g/dL   Albumin 4.0  3.5 - 5.2 g/dL   AST 21  0 - 37 U/L   ALT 22  0 - 35 U/L   Alkaline Phosphatase 125 (*) 39 - 117 U/L   Total Bilirubin 0.3  0.3 - 1.2 mg/dL   GFR calc non Af Amer 63 (*) >90 mL/min   GFR calc Af Amer 73 (*) >90 mL/min  URINALYSIS, ROUTINE W REFLEX MICROSCOPIC      Result Value Range   Color, Urine YELLOW  YELLOW   APPearance CLEAR  CLEAR   Specific Gravity, Urine 1.010  1.005 - 1.030   pH 6.0  5.0 - 8.0   Glucose, UA NEGATIVE  NEGATIVE mg/dL   Hgb urine dipstick NEGATIVE  NEGATIVE   Bilirubin Urine NEGATIVE  NEGATIVE   Ketones, ur NEGATIVE  NEGATIVE mg/dL   Protein, ur NEGATIVE  NEGATIVE mg/dL   Urobilinogen, UA 0.2  0.0 - 1.0 mg/dL   Nitrite NEGATIVE  NEGATIVE   Leukocytes, UA  NEGATIVE  NEGATIVE   Dg Abd Acute W/chest  01/21/2013  *RADIOLOGY REPORT*  Clinical Data: Abdominal pain and distention, nausea and vomiting for 2 days  ACUTE ABDOMEN SERIES (ABDOMEN 2 VIEW & CHEST 1 VIEW)  Comparison: 01/22/2008 abdominal radiographs, 12/12/2008 chest radiograph  Findings: Normal heart size, mediastinal contours, and pulmonary vascularity. Lungs clear. No pleural effusion or pneumothorax. Surgical clips right upper quadrant consistent with history of cholecystectomy. Nonobstructive bowel gas pattern. No bowel dilatation,  bowel wall thickening, or free intraperitoneal air. Bones unremarkable. No urinary tract calcification.  IMPRESSION: No acute abnormalities.   Original Report Authenticated By: Lavonia Dana, M.D.     Lab tests and x-rays were all normal.  Will give Rx for hydrocodone-acetaminophen q4h prn pain, and encourage followup with her gastroenterologist in [place], [place].        1. Abdominal pain        Mylinda Latina III, MD 01/21/13 2325

## 2013-01-21 NOTE — ED Notes (Signed)
Dr Monia Pouch in room assessing pt at this time

## 2013-01-22 MED ORDER — HYDROMORPHONE HCL PF 1 MG/ML IJ SOLN
1.0000 mg | Freq: Once | INTRAMUSCULAR | Status: AC
  Administered 2013-01-22: 1 mg via INTRAMUSCULAR

## 2013-05-27 ENCOUNTER — Ambulatory Visit: Payer: Self-pay | Admitting: Gastroenterology

## 2013-06-09 ENCOUNTER — Other Ambulatory Visit: Payer: Self-pay | Admitting: Neurology

## 2013-06-09 DIAGNOSIS — R270 Ataxia, unspecified: Secondary | ICD-10-CM

## 2013-06-20 ENCOUNTER — Ambulatory Visit: Payer: Self-pay | Admitting: Gastroenterology

## 2013-06-23 ENCOUNTER — Ambulatory Visit (HOSPITAL_COMMUNITY): Payer: BC Managed Care – PPO

## 2013-06-28 ENCOUNTER — Ambulatory Visit (HOSPITAL_COMMUNITY)
Admission: RE | Admit: 2013-06-28 | Discharge: 2013-06-28 | Disposition: A | Discharge status: Home / Self Care | Payer: Medicare Other | Source: Ambulatory Visit | Attending: Neurology | Admitting: Neurology

## 2013-06-28 ENCOUNTER — Encounter (HOSPITAL_COMMUNITY): Payer: Self-pay

## 2013-06-28 DIAGNOSIS — R110 Nausea: Secondary | ICD-10-CM | POA: Insufficient documentation

## 2013-06-28 DIAGNOSIS — R42 Dizziness and giddiness: Secondary | ICD-10-CM | POA: Insufficient documentation

## 2013-06-28 DIAGNOSIS — R270 Ataxia, unspecified: Secondary | ICD-10-CM

## 2013-06-28 DIAGNOSIS — M852 Hyperostosis of skull: Secondary | ICD-10-CM | POA: Insufficient documentation

## 2013-06-28 DIAGNOSIS — G43909 Migraine, unspecified, not intractable, without status migrainosus: Secondary | ICD-10-CM | POA: Insufficient documentation

## 2013-10-06 ENCOUNTER — Emergency Department (HOSPITAL_COMMUNITY): Payer: Medicare Other

## 2013-10-06 ENCOUNTER — Observation Stay (HOSPITAL_COMMUNITY)
Admission: EM | Admit: 2013-10-06 | Discharge: 2013-10-08 | Disposition: A | Discharge status: Home / Self Care | Payer: Medicare Other | Attending: Internal Medicine | Admitting: Internal Medicine

## 2013-10-06 ENCOUNTER — Encounter (HOSPITAL_COMMUNITY): Payer: Self-pay | Admitting: Emergency Medicine

## 2013-10-06 DIAGNOSIS — F419 Anxiety disorder, unspecified: Secondary | ICD-10-CM | POA: Insufficient documentation

## 2013-10-06 DIAGNOSIS — R1310 Dysphagia, unspecified: Secondary | ICD-10-CM

## 2013-10-06 DIAGNOSIS — R072 Precordial pain: Secondary | ICD-10-CM

## 2013-10-06 DIAGNOSIS — R079 Chest pain, unspecified: Secondary | ICD-10-CM

## 2013-10-06 DIAGNOSIS — E039 Hypothyroidism, unspecified: Secondary | ICD-10-CM

## 2013-10-06 DIAGNOSIS — F418 Other specified anxiety disorders: Secondary | ICD-10-CM

## 2013-10-06 DIAGNOSIS — K224 Dyskinesia of esophagus: Secondary | ICD-10-CM

## 2013-10-06 DIAGNOSIS — G43709 Chronic migraine without aura, not intractable, without status migrainosus: Secondary | ICD-10-CM | POA: Diagnosis present

## 2013-10-06 DIAGNOSIS — M797 Fibromyalgia: Secondary | ICD-10-CM

## 2013-10-06 DIAGNOSIS — IMO0002 Reserved for concepts with insufficient information to code with codable children: Secondary | ICD-10-CM

## 2013-10-06 DIAGNOSIS — R001 Bradycardia, unspecified: Secondary | ICD-10-CM

## 2013-10-06 DIAGNOSIS — J45909 Unspecified asthma, uncomplicated: Secondary | ICD-10-CM | POA: Diagnosis present

## 2013-10-06 DIAGNOSIS — K219 Gastro-esophageal reflux disease without esophagitis: Secondary | ICD-10-CM | POA: Diagnosis present

## 2013-10-06 DIAGNOSIS — K2970 Gastritis, unspecified, without bleeding: Secondary | ICD-10-CM

## 2013-10-06 DIAGNOSIS — F411 Generalized anxiety disorder: Secondary | ICD-10-CM

## 2013-10-06 HISTORY — DX: Dyskinesia of esophagus: K22.4

## 2013-10-06 HISTORY — DX: Gastro-esophageal reflux disease without esophagitis: K21.9

## 2013-10-06 HISTORY — DX: Hypothyroidism, unspecified: E03.9

## 2013-10-06 HISTORY — DX: Malignant neoplasm of thyroid gland: C73

## 2013-10-06 HISTORY — DX: Irritable bowel syndrome, unspecified: K58.9

## 2013-10-06 HISTORY — DX: Sjogren syndrome, unspecified: M35.00

## 2013-10-06 HISTORY — DX: Hyperlipidemia, unspecified: E78.5

## 2013-10-06 HISTORY — DX: Unspecified asthma, uncomplicated: J45.909

## 2013-10-06 HISTORY — DX: Gastritis, unspecified, without bleeding: K29.70

## 2013-10-06 HISTORY — DX: Anxiety disorder, unspecified: F41.9

## 2013-10-06 LAB — BASIC METABOLIC PANEL
BUN: 19 mg/dL (ref 6–23)
Calcium: 9.6 mg/dL (ref 8.4–10.5)
Chloride: 99 mEq/L (ref 96–112)
Creatinine, Ser: 0.85 mg/dL (ref 0.50–1.10)
GFR calc Af Amer: 86 mL/min — ABNORMAL LOW (ref >90)

## 2013-10-06 LAB — TROPONIN I: Troponin I: <0.3 ng/mL (ref <0.30)

## 2013-10-06 LAB — POCT I-STAT, CHEM 8
Calcium, Ion: 1.22 mmol/L (ref 1.12–1.23)
Chloride: 104 mEq/L (ref 96–112)
Glucose, Bld: 102 mg/dL — ABNORMAL HIGH (ref 70–99)
HCT: 49 % — ABNORMAL HIGH (ref 36.0–46.0)
TCO2: 23 mmol/L (ref 0–100)

## 2013-10-06 LAB — CBC
HCT: 44.3 % (ref 36.0–46.0)
MCH: 30.3 pg (ref 26.0–34.0)
MCHC: 32.7 g/dL (ref 30.0–36.0)
MCV: 92.5 fL (ref 78.0–100.0)
Platelets: 354 10*3/uL (ref 150–400)
RDW: 14.3 % (ref 11.5–15.5)
WBC: 14.7 10*3/uL — ABNORMAL HIGH (ref 4.0–10.5)

## 2013-10-06 MED ORDER — GABAPENTIN 600 MG PO TABS
600.0000 mg | ORAL_TABLET | Freq: Every day | ORAL | Status: DC
  Administered 2013-10-06: 600 mg via ORAL
  Filled 2013-10-06: qty 1

## 2013-10-06 MED ORDER — MORPHINE SULFATE 2 MG/ML IJ SOLN
2.0000 mg | INTRAMUSCULAR | Status: DC | PRN
  Administered 2013-10-06 – 2013-10-08 (×5): 2 mg via INTRAVENOUS
  Filled 2013-10-06 (×5): qty 1

## 2013-10-06 MED ORDER — NITROGLYCERIN 0.4 MG SL SUBL
0.4000 mg | SUBLINGUAL_TABLET | Freq: Once | SUBLINGUAL | Status: AC
  Administered 2013-10-06: 0.4 mg via SUBLINGUAL

## 2013-10-06 MED ORDER — NIFEDIPINE 10 MG PO CAPS
10.0000 mg | ORAL_CAPSULE | Freq: Three times a day (TID) | ORAL | Status: DC
  Administered 2013-10-06: 10 mg via SUBLINGUAL
  Filled 2013-10-06: qty 1

## 2013-10-06 MED ORDER — NITROGLYCERIN 0.4 MG SL SUBL
0.4000 mg | SUBLINGUAL_TABLET | Freq: Once | SUBLINGUAL | Status: AC
  Administered 2013-10-06: 0.4 mg via SUBLINGUAL
  Filled 2013-10-06: qty 25

## 2013-10-06 MED ORDER — VENLAFAXINE HCL ER 75 MG PO CP24
300.0000 mg | ORAL_CAPSULE | Freq: Every day | ORAL | Status: DC
  Administered 2013-10-06 – 2013-10-07 (×2): 300 mg via ORAL
  Filled 2013-10-06 (×2): qty 4

## 2013-10-06 MED ORDER — LEVOTHYROXINE SODIUM 112 MCG PO TABS
112.0000 ug | ORAL_TABLET | Freq: Every day | ORAL | Status: DC
Start: 2013-10-07 — End: 2013-10-08
  Administered 2013-10-07 – 2013-10-08 (×2): 112 ug via ORAL
  Filled 2013-10-06 (×4): qty 1

## 2013-10-06 MED ORDER — ASPIRIN 325 MG PO TABS
325.0000 mg | ORAL_TABLET | Freq: Once | ORAL | Status: AC
  Administered 2013-10-06: 325 mg via ORAL
  Filled 2013-10-06: qty 1

## 2013-10-06 MED ORDER — ALUM & MAG HYDROXIDE-SIMETH 200-200-20 MG/5ML PO SUSP
30.0000 mL | Freq: Four times a day (QID) | ORAL | Status: DC | PRN
  Administered 2013-10-07: 30 mL via ORAL
  Filled 2013-10-06: qty 30

## 2013-10-06 MED ORDER — OXYCODONE HCL 5 MG PO TABS
5.0000 mg | ORAL_TABLET | ORAL | Status: DC | PRN
  Administered 2013-10-06 – 2013-10-07 (×3): 5 mg via ORAL
  Filled 2013-10-06 (×3): qty 1

## 2013-10-06 MED ORDER — GLUCAGON HCL (RDNA) 1 MG IJ SOLR
1.0000 mg | Freq: Once | INTRAMUSCULAR | Status: AC
  Administered 2013-10-06: 1 mg via INTRAVENOUS
  Filled 2013-10-06: qty 1

## 2013-10-06 MED ORDER — GABAPENTIN 300 MG PO CAPS
ORAL_CAPSULE | ORAL | Status: AC
  Filled 2013-10-06: qty 2

## 2013-10-06 MED ORDER — LORAZEPAM 2 MG/ML IJ SOLN
1.0000 mg | Freq: Once | INTRAMUSCULAR | Status: AC
  Administered 2013-10-06: 1 mg via INTRAVENOUS
  Filled 2013-10-06: qty 1

## 2013-10-06 MED ORDER — ACETAMINOPHEN 650 MG RE SUPP: 650.0000 mg | Freq: Four times a day (QID) | RECTAL | Status: DC | PRN

## 2013-10-06 MED ORDER — PANTOPRAZOLE SODIUM 40 MG IV SOLR
40.0000 mg | Freq: Two times a day (BID) | INTRAVENOUS | Status: DC
  Administered 2013-10-06 – 2013-10-07 (×2): 40 mg via INTRAVENOUS
  Filled 2013-10-06 (×2): qty 40

## 2013-10-06 MED ORDER — ONDANSETRON HCL 4 MG PO TABS: 4.0000 mg | ORAL_TABLET | Freq: Four times a day (QID) | ORAL | Status: DC | PRN

## 2013-10-06 MED ORDER — FLUTICASONE PROPIONATE 50 MCG/ACT NA SUSP
2.0000 | Freq: Every day | NASAL | Status: DC
  Administered 2013-10-07 – 2013-10-08 (×2): 2 via NASAL
  Filled 2013-10-06: qty 16

## 2013-10-06 MED ORDER — HEPARIN SODIUM (PORCINE) 5000 UNIT/ML IJ SOLN
5000.0000 [IU] | Freq: Three times a day (TID) | INTRAMUSCULAR | Status: DC
  Administered 2013-10-06 – 2013-10-07 (×2): 5000 [IU] via SUBCUTANEOUS
  Filled 2013-10-06 (×2): qty 1

## 2013-10-06 MED ORDER — GI COCKTAIL ~~LOC~~
30.0000 mL | Freq: Once | ORAL | Status: AC
  Administered 2013-10-06: 30 mL via ORAL
  Filled 2013-10-06: qty 30

## 2013-10-06 MED ORDER — POTASSIUM CHLORIDE IN NACL 20-0.9 MEQ/L-% IV SOLN
INTRAVENOUS | Status: DC
  Administered 2013-10-07 (×3): via INTRAVENOUS

## 2013-10-06 MED ORDER — MORPHINE SULFATE 4 MG/ML IJ SOLN
4.0000 mg | Freq: Once | INTRAMUSCULAR | Status: AC
  Administered 2013-10-06: 4 mg via INTRAVENOUS
  Filled 2013-10-06: qty 1

## 2013-10-06 MED ORDER — POLYVINYL ALCOHOL 1.4 % OP SOLN: 1.0000 [drp] | OPHTHALMIC | Status: DC | PRN

## 2013-10-06 MED ORDER — ALPRAZOLAM 0.5 MG PO TABS
0.5000 mg | ORAL_TABLET | Freq: Four times a day (QID) | ORAL | Status: DC | PRN
  Administered 2013-10-06 – 2013-10-07 (×2): 0.5 mg via ORAL
  Filled 2013-10-06 (×2): qty 1

## 2013-10-06 MED ORDER — ALBUTEROL SULFATE HFA 108 (90 BASE) MCG/ACT IN AERS
2.0000 | INHALATION_SPRAY | Freq: Two times a day (BID) | RESPIRATORY_TRACT | Status: DC
  Administered 2013-10-06 – 2013-10-08 (×4): 2 via RESPIRATORY_TRACT
  Filled 2013-10-06: qty 6.7

## 2013-10-06 MED ORDER — POLYETHYL GLYCOL-PROPYL GLYCOL 0.4-0.3 % OP SOLN: 1.0000 [drp] | Freq: Two times a day (BID) | OPHTHALMIC | Status: DC

## 2013-10-06 MED ORDER — ONDANSETRON HCL 4 MG/2ML IJ SOLN: 4.0000 mg | Freq: Four times a day (QID) | INTRAMUSCULAR | Status: DC | PRN

## 2013-10-06 MED ORDER — ZOLPIDEM TARTRATE 5 MG PO TABS
5.0000 mg | ORAL_TABLET | Freq: Once | ORAL | Status: AC
  Administered 2013-10-06: 5 mg via ORAL
  Filled 2013-10-06: qty 1

## 2013-10-06 MED ORDER — GUAIFENESIN-DM 100-10 MG/5ML PO SYRP: 5.0000 mL | ORAL_SOLUTION | ORAL | Status: DC | PRN

## 2013-10-06 MED ORDER — BENZOCAINE 20 % MT GEL
1.0000 "application " | Freq: Two times a day (BID) | OROMUCOSAL | Status: DC | PRN
  Filled 2013-10-06: qty 1

## 2013-10-06 MED ORDER — IOHEXOL 350 MG/ML SOLN
100.0000 mL | Freq: Once | INTRAVENOUS | Status: AC | PRN
  Administered 2013-10-06: 100 mL via INTRAVENOUS

## 2013-10-06 MED ORDER — AMITRIPTYLINE HCL 25 MG PO TABS
25.0000 mg | ORAL_TABLET | Freq: Every day | ORAL | Status: DC
  Administered 2013-10-06 – 2013-10-07 (×2): 25 mg via ORAL
  Filled 2013-10-06 (×2): qty 1

## 2013-10-06 MED ORDER — ACETAMINOPHEN 325 MG PO TABS: 650.0000 mg | ORAL_TABLET | Freq: Four times a day (QID) | ORAL | Status: DC | PRN

## 2013-10-06 NOTE — ED Notes (Signed)
Pt c/o generalized cp radiating to back x 30 min.

## 2013-10-06 NOTE — H&P (Signed)
Susan [name] MRN: [id number] DOB/AGE: 58/05/1956 58 y.o. Primary Care Physician:MORAYATI,SHAMIL J, MD Admit date: 10/06/2013 Chief Complaint: Chest pain CODE STATUS: Full code Family communication: Discussed with her husband. Disposition: Plan to discharge to home in the next 24-48 hours.  HPI: The patient is a 57 year old woman with a history significant for nutcracker esophagus, gastroesophageal reflux disease, spastic colon, migraine headaches, and fibromyalgia, who presents to the emergency department today with an episode of acute substernal/precordial chest pain that radiates to her mid upper back. Accordingly, the patient's primary care physician had referred her to Dr. Merlene Laughter today for further evaluation and management of chronic migraine headaches. While she was sitting in Dr. Freddie Apley office today, she had sudden onset of bilateral shoulder pain, which radiated to her mid back between her shoulder blades, which then radiated to her upper chest and substernal area. She became a little clammy. She had shortness of breath briefly. She became slightly nauseated. She denies vomiting or abdominal pain. She describes the pain as pressure and a burning sensation. Changing her position did not help. She also felt a sensation as if she had something stuck in her throat. She has had no recent history of painful swallowing or difficulty swallowing. She denies abdominal pain. She denies any history of melena or bright red blood per rectum. She has chronic constipation for which she uses home enemas on her regular basis. Her last bowel movement was 2 days ago. She also has a history of GERD and was recently prescribed Dexilant for refractory heartburn. She also has a history of chronic anxiety, but she denies panic symptomatology. She takes ibuprofen on occasion for pain. She also takes as needed Treximet which is a combination of sumatriptan and and naproxen. Of note, has almost completed a recent  prednisone taper for asthmatic bronchitis.  In the emergency department, she is hemodynamically stable and afebrile. Her lab data are significant for a WBC of 14.7, hemoglobin of 16.7, normal lipase and normal troponin I. CT angiogram of her chest and abdomen revealed no acute findings, specifically no evidence of aneurysm or dissection. Her EKG reveals normal sinus rhythm with a heart rate of 80 beats per minute and nonspecific T-wave abnormalities. She is being admitted for further evaluation and management.   Past Medical History  Diagnosis Date  . Celiac sprue   . Hypothyroidism (acquired)   . Fibromyalgia   . Arthritis   . Hyperlipemia   . Migraine   . Thyroid cancer   . Pancreatitis   . GERD (gastroesophageal reflux disease)   . Sjogren's disease   . Chronic anxiety   . Asthmatic bronchitis   . 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 Spastic colon   . Hyperlipidemia     Intolerant to statins    Past Surgical History  Procedure Laterality Date  . Cholecystectomy    . Abdominal hysterectomy    . Thyroid lobectomy    . Bladder repair    . Nasal sinus surgery      Prior to Admission medications   Medication Sig Start Date End Date Taking? Authorizing Provider  acetaminophen (TYLENOL) 500 MG tablet Take 500 mg by mouth every 6 (six) hours as needed for fever.   Yes Historical Provider, MD  ALPRAZolam Duanne Moron) 0.5 MG tablet Take 0.5 mg by mouth 4 (four) times daily as needed. FOR ANXIETY   Yes Historical Provider, MD  amitriptyline (ELAVIL) 25 MG tablet Take 25 mg by mouth at bedtime.   Yes Historical Provider, MD  benzocaine (ORAJEL MOUTH-AID) 20 % GEL Use as directed 1 application in the mouth or throat at bedtime.    Yes Historical Provider, MD  bisacodyl (BISACODYL) 5 MG EC tablet Take 10 mg by mouth daily as needed for moderate constipation.   Yes Historical Provider, MD  carisoprodol (SOMA) 350 MG tablet Take 1 tablet by mouth daily. 07/11/13  Yes Historical Provider, MD   cephALEXin (KEFLEX) 500 MG capsule Take 1 capsule by mouth 2 (two) times daily. 09/26/13  Yes Historical Provider, MD  DEXILANT 60 MG capsule Take 1 capsule by mouth daily. 09/27/13  Yes Historical Provider, MD  eletriptan (RELPAX) 40 MG tablet One tablet by mouth at onset of headache. May repeat in 2 hours if headache persists or recurs. may repeat in 2 hours if necessary   Yes Historical Provider, MD  gabapentin (NEURONTIN) 600 MG tablet Take 600 mg by mouth at bedtime.    Yes Historical Provider, MD  hyoscyamine (NULEV) 0.125 MG TBDP Place 0.125 mg under the tongue every 4 (four) hours as needed.   Yes Historical Provider, MD  ibuprofen (ADVIL,MOTRIN) 200 MG tablet Take 400 mg by mouth every 6 (six) hours as needed for headache or moderate pain.   Yes Historical Provider, MD  levothyroxine (SYNTHROID, LEVOTHROID) 112 MCG tablet Take 112 mcg by mouth every morning.    Yes Historical Provider, MD  magnesium citrate SOLN Take 1 Bottle by mouth once.   Yes Historical Provider, MD  Multiple Vitamin (MULITIVITAMIN WITH MINERALS) TABS Take 1 tablet by mouth every morning.    Yes Historical Provider, MD  OVER THE COUNTER MEDICATION Place 1 application into both eyes at bedtime.   Yes Historical Provider, MD  Pediatric Multiple Vitamins (CHILDRENS MULTI-VITAMINS PO) Take 2 tablets by mouth daily.   Yes Historical Provider, MD  Probiotic Product ([place]) Take 1 tablet by mouth every morning.    Yes Historical Provider, MD  simethicone (GAS-X) 80 MG chewable tablet Chew 80 mg by mouth every 6 (six) hours as needed. FOR RELIEF   Yes Historical Provider, MD  SUMAtriptan-naproxen (TREXIMET) 85-500 MG per tablet Take 1 tablet by mouth daily as needed for migraine.   Yes Historical Provider, MD  venlafaxine XR (EFFEXOR-XR) 150 MG 24 hr capsule Take 300 mg by mouth at bedtime.   Yes Historical Provider, MD  fluticasone (FLONASE) 50 MCG/ACT nasal spray Place 2 sprays into the nose daily.     Historical Provider, MD  MINIVELLE 0.1 MG/24HR patch 1 patch 2 (two) times a week.  09/27/13   Historical Provider, MD  Polyethyl Glycol-Propyl Glycol (SYSTANE) 0.4-0.3 % SOLN Apply 1 drop to eye 2 (two) times daily.     Historical Provider, MD  predniSONE (STERAPRED UNI-PAK) 10 MG tablet  09/27/13   Historical Provider, MD    Allergies:  Allergies  Allergen Reactions  . Aspirin   . Vioxx [Rofecoxib] Diarrhea  . Celebrex [Celecoxib] Hives  . Metoclopramide Hcl Anxiety    BODY TREMORS    Family History  Problem Relation Age of Onset  . Pancreatitis Neg Hx   . Arthritis/Rheumatoid Mother     died in age 64s  . Colon cancer Neg Hx   . Breast cancer Maternal Grandmother   . Breast cancer Mother   . Breast cancer Maternal Aunt     X 2  . Lung cancer Maternal Aunt    Family history continued. Her father had COPD. Her mother died of breast cancer. She also had  rheumatoid arthritis.  Social History: She is married. She has no children. She lives in [place]. She receives disability benefits. She denies tobacco, alcohol, and illicit drug use.          ROS: She has chronic anxiety, chronic fibromyalgia pain at the trigger points, chronic migraine headaches, occasional palpitations,; as above in history present illness, otherwise review of systems is negative.  PHYSICAL EXAM: Blood pressure 112/83, pulse 85, temperature 97.8 F (36.6 C), resp. rate 18, height 5' 4"  (1.626 m), weight 83.462 kg (184 lb), SpO2 94.00%.  General: 57 year old Caucasian woman who is sitting up in bed, in no acute distress. She is obviously less anxious than what was reported earlier. HEENT: Head is normocephalic, nontraumatic. Pupils are equal, round, and reactive to light. Extraocular movements are intact. Conjunctivae are clear. Sclerae are white. Nasal mucosa is dry. No active rhinorrhea. Oropharynx reveals mildly dry mucous membranes. No posterior exudates or edema. Neck: Well-healed  horizontal scar. No thyromegaly or adenopathy or JVD or bruit. Lungs/respiratory: Clear to auscultation bilaterally. Breathing is nonlabored. Heart: S1, S2, with no murmurs rubs or gallops. Abdomen: Positive bowel sounds, soft, nontender, nondistended. GU/rectal: Deferred. Musculoskeletal/extremities: Mild tenderness of the thoracic muscles without spasm, warmth, or erythema. No acute hot red joints. Pedal pulses palpable bilaterally. No pedal edema and no pretibial edema. Psychiatric: She was apparently anxious earlier, but she does not appear to be anxious now. She is alert and oriented x3. Her affect is flat but pleasant. Her speech is clear. No tremulousness. Neurological: Cranial nerves II through XII are intact. Strength is 5 over 5 throughout. Sensation is intact.  Basic Metabolic Panel:  Recent Labs  10/06/13 1317 10/06/13 1328  NA 137 141  K 4.0 3.7  CL 99 104  CO2 24  --   GLUCOSE 107* 102*  BUN 19 19  CREATININE 0.85 0.90  CALCIUM 9.6  --    Liver Function Tests: No results found for this basename: AST, ALT, ALKPHOS, BILITOT, PROT, ALBUMIN,  in the last 72 hours  Recent Labs  10/06/13 1317  LIPASE 29   No results found for this basename: AMMONIA,  in the last 72 hours CBC:  Recent Labs  10/06/13 1317 10/06/13 1328  WBC 14.7*  --   HGB 14.5 16.7*  HCT 44.3 49.0*  MCV 92.5  --   PLT 354  --    Cardiac Enzymes:  Recent Labs  10/06/13 1701  TROPONINI <0.30   BNP:  Recent Labs  10/06/13 1317  PROBNP 40.9   D-Dimer: No results found for this basename: DDIMER,  in the last 72 hours CBG: No results found for this basename: GLUCAP,  in the last 72 hours Hemoglobin A1C: No results found for this basename: HGBA1C,  in the last 72 hours Fasting Lipid Panel: No results found for this basename: CHOL, HDL, LDLCALC, TRIG, CHOLHDL, LDLDIRECT,  in the last 72 hours Thyroid Function Tests: No results found for this basename: TSH, T4TOTAL, FREET4, T3FREE,  THYROIDAB,  in the last 72 hours Anemia Panel: No results found for this basename: VITAMINB12, FOLATE, FERRITIN, TIBC, IRON, RETICCTPCT,  in the last 72 hours Coagulation: No results found for this basename: LABPROT, INR,  in the last 72 hours Urine Drug Screen: Drugs of Abuse  No results found for this basename: labopia,  cocainscrnur,  labbenz,  amphetmu,  thcu,  labbarb    Alcohol Level: No results found for this basename: ETH,  in the last 72 hours Urinalysis: No  results found for this basename: COLORURINE, APPERANCEUR, LABSPEC, [place], GLUCOSEU, HGBUR, BILIRUBINUR, KETONESUR, PROTEINUR, UROBILINOGEN, NITRITE, LEUKOCYTESUR,  in the last 72 hours Misc. Labs:   EKG: Normal sinus rhythm with a heart rate of 80 beats per minute and nonspecific T-wave changes.  No results found for this or any previous visit (from the past 240 hour(s)).   Results for orders placed during the hospital encounter of 10/06/13 (from the past 48 hour(s))  CBC     Status: Abnormal   Collection Time    10/06/13  1:17 PM      Result Value Range   WBC 14.7 (*) 4.0 - 10.5 K/uL   RBC 4.79  3.87 - 5.11 MIL/uL   Hemoglobin 14.5  12.0 - 15.0 g/dL   HCT 44.3  36.0 - 46.0 %   MCV 92.5  78.0 - 100.0 fL   MCH 30.3  26.0 - 34.0 pg   MCHC 32.7  30.0 - 36.0 g/dL   RDW 14.3  11.5 - 15.5 %   Platelets 354  150 - 400 K/uL  BASIC METABOLIC PANEL     Status: Abnormal   Collection Time    10/06/13  1:17 PM      Result Value Range   Sodium 137  135 - 145 mEq/L   Potassium 4.0  3.5 - 5.1 mEq/L   Chloride 99  96 - 112 mEq/L   CO2 24  19 - 32 mEq/L   Glucose, Bld 107 (*) 70 - 99 mg/dL   BUN 19  6 - 23 mg/dL   Creatinine, Ser 0.85  0.50 - 1.10 mg/dL   Calcium 9.6  8.4 - 10.5 mg/dL   GFR calc non Af Amer 74 (*) >90 mL/min   GFR calc Af Amer 86 (*) >90 mL/min   Comment: (NOTE)     The eGFR has been calculated using the CKD EPI equation.     This calculation has not been validated in all clinical situations.      eGFR's persistently <90 mL/min signify possible Chronic Kidney     Disease.  PRO B NATRIURETIC PEPTIDE     Status: None   Collection Time    10/06/13  1:17 PM      Result Value Range   Pro B Natriuretic peptide (BNP) 40.9  0 - 125 pg/mL  LIPASE, BLOOD     Status: None   Collection Time    10/06/13  1:17 PM      Result Value Range   Lipase 29  11 - 59 U/L  POCT I-STAT TROPONIN I     Status: None   Collection Time    10/06/13  1:26 PM      Result Value Range   Troponin i, poc 0.00  0.00 - 0.08 ng/mL   Comment 3            Comment: Due to the release kinetics of cTnI,     a negative result within the first hours     of the onset of symptoms does not rule out     myocardial infarction with certainty.     If myocardial infarction is still suspected,     repeat the test at appropriate intervals.  POCT I-STAT, CHEM 8     Status: Abnormal   Collection Time    10/06/13  1:28 PM      Result Value Range   Sodium 141  135 - 145 mEq/L   Potassium 3.7  3.5 -  5.1 mEq/L   Chloride 104  96 - 112 mEq/L   BUN 19  6 - 23 mg/dL   Creatinine, Ser 0.90  0.50 - 1.10 mg/dL   Glucose, Bld 102 (*) 70 - 99 mg/dL   Calcium, Ion 1.22  1.12 - 1.23 mmol/L   TCO2 23  0 - 100 mmol/L   Hemoglobin 16.7 (*) 12.0 - 15.0 g/dL   HCT 49.0 (*) 36.0 - 46.0 %  TROPONIN I     Status: None   Collection Time    10/06/13  5:01 PM      Result Value Range   Troponin I <0.30  <0.30 ng/mL   Comment:            Due to the release kinetics of cTnI,     a negative result within the first hours     of the onset of symptoms does not rule out     myocardial infarction with certainty.     If myocardial infarction is still suspected,     repeat the test at appropriate intervals.    Ct Angio Chest Aorta W/cm &/or Wo/cm  10/06/2013   CLINICAL DATA:  Chest and back pain.  EXAM: CT ANGIOGRAPHY CHEST, ABDOMEN AND PELVIS  TECHNIQUE: Multidetector CT imaging through the chest, abdomen and pelvis was performed using the standard  protocol during bolus administration of intravenous contrast. Multiplanar reconstructed images including MIPs were obtained and reviewed to evaluate the vascular anatomy.  CONTRAST:  155m OMNIPAQUE IOHEXOL 350 MG/ML SOLN  COMPARISON:  CT scan of February 11, 2012.  FINDINGS: CTA CHEST FINDINGS  No pleural effusion or pneumothorax is noted. No abnormality is seen in the pulmonary parenchyma. No mediastinal mass or adenopathy is noted. The thoracic aorta appears normal without evidence of aneurysm or dissection. The proximal great vessels appear normal. The pulmonary artery and its branches appear normal. No definite osseous abnormality is noted in the chest.  Review of the MIP images confirms the above findings.  CTA ABDOMEN AND PELVIS FINDINGS  The abdominal aorta and its mesenteric and renal branches appear widely patent. Mild atherosclerotic calcifications of abdominal aorta are noted without aneurysm or dissection. The visualized portions of the iliac arteries appear normal. Status post cholecystectomy. The liver, spleen and pancreas appear normal. Adrenal glands and kidneys appear normal. No evidence of bowel obstruction is noted. No abnormal fluid collection is noted. The appendix appears normal. Urinary bladder appears normal. No osseous abnormality is noted.  Review of the MIP images confirms the above findings.  IMPRESSION: No evidence of aneurysm or dissection seen involving the thoracic or abdominal aorta.   Electronically Signed   By: JSabino DickM.D.   On: 10/06/2013 15:16   Ct Angio Abd/pel W/ And/or W/o  10/06/2013   CLINICAL DATA:  Chest and back pain.  EXAM: CT ANGIOGRAPHY CHEST, ABDOMEN AND PELVIS  TECHNIQUE: Multidetector CT imaging through the chest, abdomen and pelvis was performed using the standard protocol during bolus administration of intravenous contrast. Multiplanar reconstructed images including MIPs were obtained and reviewed to evaluate the vascular anatomy.  CONTRAST:  1037m OMNIPAQUE IOHEXOL 350 MG/ML SOLN  COMPARISON:  CT scan of February 11, 2012.  FINDINGS: CTA CHEST FINDINGS  No pleural effusion or pneumothorax is noted. No abnormality is seen in the pulmonary parenchyma. No mediastinal mass or adenopathy is noted. The thoracic aorta appears normal without evidence of aneurysm or dissection. The proximal great vessels appear normal. The pulmonary artery and its branches  appear normal. No definite osseous abnormality is noted in the chest.  Review of the MIP images confirms the above findings.  CTA ABDOMEN AND PELVIS FINDINGS  The abdominal aorta and its mesenteric and renal branches appear widely patent. Mild atherosclerotic calcifications of abdominal aorta are noted without aneurysm or dissection. The visualized portions of the iliac arteries appear normal. Status post cholecystectomy. The liver, spleen and pancreas appear normal. Adrenal glands and kidneys appear normal. No evidence of bowel obstruction is noted. No abnormal fluid collection is noted. The appendix appears normal. Urinary bladder appears normal. No osseous abnormality is noted.  Review of the MIP images confirms the above findings.  IMPRESSION: No evidence of aneurysm or dissection seen involving the thoracic or abdominal aorta.   Electronically Signed   By: Sabino Dick M.D.   On: 10/06/2013 15:16    Impression:  Principal Problem:   Substernal precordial chest pain Active Problems:   GERD (gastroesophageal reflux disease)   Nutcracker esophagus   Asthmatic bronchitis   Depression with anxiety   Fibromyalgia   Hypothyroidism (acquired)   Chronic migraine   1. Substernal precordial chest pain. Her pain appears to be atypical for angina. Her troponin I is negative x2. Her EKG is not impressive. CT angiogram of the chest and abdomen were unremarkable. Given her extensive GI history, it may be that her symptoms are associated with gastroesophageal reflux disease, esophagitis, or esophageal spasm. Of  note, she takes ibuprofen occasionally and and antimigraine medication , reximet that has naproxen in it. She was also completing a prednisone taper. Certainly these medications could aggravate GI symptomatology.  2. History of GERD and nutcracker esophagus. She was recently prescribed Dexilant. As above, she takes a couple of incidents occasionally and was completing a course of prednisone.  3. Depression with anxiety. The patient takes Effexor, amitriptyline, and Xanax on a regular basis. She denies panic symptomatology, but it certainly remains a possibility.  4. Asthmatic bronchitis. There are no wheezes on exam. Her chest CT is relatively clear. She had been on Keflex and a prednisone taper, but I believe they are no longer needed.  5. Chronic migraines and fibromyalgia. Will hold Treximet and treat her pain with as needed opiate analgesics. Continue gabapentin and amitriptyline. We'll consult Dr. Merlene Laughter for further evaluation and management.  6. Leukocytosis. This is likely secondary to recent prednisone therapy. She is afebrile.   Plan: (The patient received multiple medications in the ED, with some relief, but it is unclear which medication actually helped.)  1. GI has been consulted and Dr. Gala Romney recommends an upper endoscopy tomorrow. 2. We'll start a clear liquid diet and gentle IV fluid hydration. 3. We'll start twice a day dosing of IV Protonix. 4. We'll hold all steroidal and nonsteroidal medications. 5. Consult Dr. Merlene Laughter for management recommendations for her chronic migraine headaches. 6. For further evaluation, we'll order cardiac enzymes, hepatic function panel, and 2-D echocardiogram.   Total time: One hour and 10 minutes.   Catheleen Langhorne 10/06/2013, 6:27 PM

## 2013-10-06 NOTE — ED Provider Notes (Signed)
CSN: [id number]     Arrival date & time 10/06/13  1234 History  This chart was scribed for Susan Shipper, MD,  by Stacy Gardner, ED Scribe. The patient was seen in room APA09/APA09 and the patient's care was started at 1:10 PM.   First MD Initiated Contact with Patient 10/06/13 1258     Chief Complaint  Patient presents with  . Chest Pain   (Consider location/radiation/quality/duration/timing/severity/associated sxs/prior Treatment) Patient is a 57 y.o. female presenting with chest pain. The history is provided by the patient.  Chest Pain Pain location:  Substernal area Pain quality: burning and pressure   Pain radiates to:  Mid back and R arm Pain radiates to the back: no   Pain severity:  Moderate Onset quality:  Gradual Duration:  30 minutes Timing:  Constant Progression:  Worsening Relieved by:  Nothing Worsened by:  Nothing tried Associated symptoms: back pain, nausea and shortness of breath   Associated symptoms: no abdominal pain, no numbness and not vomiting   Risk factors: no smoking   HPI HPI Comments: Susan [name] is a 57 y.o. female who presents to the Emergency Department complaining of progressively worsening moderate, constant chest pain that occurred while sitting at her neurologist's office 30 mins PTA. She explains it feels as through she has a "knot" in her throat. Pt explains the chest pain extends to her back as though she has "swallowed a piece of hard candy". She tried walking to alleviate the pain without relief. Pt is experiencing pain that radiates between her shoulder blades and back. It is described as "someone standing on my back and burning it" and as pressure. Pt denies numbness and tingling in her extremities. She is experiencing pain in her right arm. Pt has SOB and nausea. She denies trouble swallowing.   Past Medical History  Diagnosis Date  . Celiac sprue   . Thyroid disease   . Fibromyalgia   . Arthritis   . Hyperlipemia   .  Migraine   . Cancer     thyroid  . Pancreatitis   . GERD (gastroesophageal reflux disease)    Past Surgical History  Procedure Laterality Date  . Cholecystectomy    . Abdominal hysterectomy    . Thyroid lobectomy    . Bladder repair     No family history on file. History  Substance Use Topics  . Smoking status: Never Smoker   . Smokeless tobacco: Not on file  . Alcohol Use: No   OB History   Grav Para Term Preterm Abortions TAB SAB Ect Mult Living                 Review of Systems  Respiratory: Positive for shortness of breath.   Cardiovascular: Positive for chest pain.  Gastrointestinal: Positive for nausea. Negative for vomiting and abdominal pain.  Musculoskeletal: Positive for arthralgias, back pain and myalgias.  Neurological: Negative for numbness.  All other systems reviewed and are negative.    Allergies  Aspirin; Vioxx; Celebrex; and Metoclopramide hcl  Home Medications   Current Outpatient Rx  Name  Route  Sig  Dispense  Refill  . ALPRAZolam (XANAX) 0.5 MG tablet   Oral   Take 0.5 mg by mouth 4 (four) times daily as needed. FOR ANXIETY         . amitriptyline (ELAVIL) 25 MG tablet   Oral   Take 25 mg by mouth at bedtime.         Marland Kitchen atorvastatin (LIPITOR)  20 MG tablet   Oral   Take 20 mg by mouth every evening.         . benzocaine (ORAJEL MOUTH-AID) 20 % GEL   Mouth/Throat   Use as directed 1 application in the mouth or throat at bedtime.          . cholecalciferol (VITAMIN D) 1000 UNITS tablet   Oral   Take 2,000 Units by mouth every morning.         . conjugated estrogens (PREMARIN) vaginal cream   Vaginal   Place 2.75 g vaginally 3 (three) times a week.         . eletriptan (RELPAX) 40 MG tablet   Oral   One tablet by mouth at onset of headache. May repeat in 2 hours if headache persists or recurs. may repeat in 2 hours if necessary         . fish oil-omega-3 fatty acids 1000 MG capsule   Oral   Take 1 g by mouth at  bedtime.          . fluticasone (FLONASE) 50 MCG/ACT nasal spray   Nasal   Place 2 sprays into the nose daily.         Marland Kitchen gabapentin (NEURONTIN) 600 MG tablet   Oral   Take 600 mg by mouth at bedtime.          Marland Kitchen HYDROcodone-acetaminophen (NORCO/VICODIN) 5-325 MG per tablet   Oral   Take 1 tablet by mouth every 4 (four) hours as needed for pain.   20 tablet   0   . hyoscyamine (NULEV) 0.125 MG TBDP   Sublingual   Place 0.125 mg under the tongue every 4 (four) hours as needed.         Marland Kitchen levothyroxine (SYNTHROID, LEVOTHROID) 112 MCG tablet   Oral   Take 112 mcg by mouth every morning.          . Multiple Vitamin (MULITIVITAMIN WITH MINERALS) TABS   Oral   Take 1 tablet by mouth every morning.          . pantoprazole (PROTONIX) 40 MG tablet   Oral   Take 40 mg by mouth every morning.          Vladimir Faster Glycol-Propyl Glycol (SYSTANE) 0.4-0.3 % SOLN   Ophthalmic   Apply 1 drop to eye 2 (two) times daily.          . Probiotic Product (PHILLIPS COLON HEALTH PO)   Oral   Take 1 tablet by mouth every morning.          . simethicone (GAS-X) 80 MG chewable tablet   Oral   Chew 80 mg by mouth every 6 (six) hours as needed. FOR RELIEF         . topiramate (TOPAMAX) 100 MG tablet   Oral   Take 100 mg by mouth every morning.          . venlafaxine XR (EFFEXOR-XR) 150 MG 24 hr capsule   Oral   Take 300 mg by mouth at bedtime.          BP 126/88  Pulse 81  Temp(Src) 97.8 F (36.6 C)  Resp 20  Ht 5' 4"  (1.626 m)  Wt 184 lb (83.462 kg)  BMI 31.57 kg/m2  SpO2 95% Physical Exam  Nursing note and vitals reviewed. Constitutional: She is oriented to person, place, and time. She appears well-developed and well-nourished. No distress.  HENT:  Head: Normocephalic and atraumatic.  Eyes: EOM are normal.  Neck: Neck supple. No tracheal deviation present.  Cardiovascular: Normal rate.   Pulmonary/Chest: Effort normal. No respiratory distress.   Abdominal: Soft. She exhibits no distension. There is no tenderness. There is no rebound and no guarding.  Musculoskeletal: Normal range of motion.  Neurological: She is alert and oriented to person, place, and time.  Skin: Skin is warm and dry.  Psychiatric: She has a normal mood and affect. Her behavior is normal.    ED Course  Procedures (including critical care time) DIAGNOSTIC STUDIES: Oxygen Saturation is 98% on room air, normal by my interpretation.    COORDINATION OF CARE: 1:17 PM Discussed course of care with pt . Pt understands and agrees.  Labs Review Labs Reviewed - No data to display Imaging Review No results found.  EKG Interpretation    Date/Time:  Thursday October 06 2013 12:35:09 EST Ventricular Rate:  80 PR Interval:  156 QRS Duration: 88 QT Interval:  370 QTC Calculation: 426 R Axis:   97 Text Interpretation:  Normal sinus rhythm Rightward axis Nonspecific T wave abnormality Abnormal ECG When compared with ECG of 12-Dec-2008 17:22, Nonspecific T wave abnormality no longer evident in Inferior leads Confirmed by Mingo Amber  MD, [place] ([id number]) on 10/06/2013 1:03:36 PM            MDM   1. Esophageal spasm   2. Chest pain   3. Chronic anxiety   4. Chronic migraine   5. GERD (gastroesophageal reflux disease)   6. Substernal precordial chest pain    57 year old female presents with chest pain. Began about 30 minutes ago. Acute onset was pressure-like pain in her chest radiating straight to her back. Also radiates to her right arm. She also states she feels like has a lump in her lower throat. She denies any prior history of coronary artery disease, hypertension. She was waiting in a doctor's office when this happened, no exertional onset. Here vitals are stable. She has blood pressures of 125/82 in right & 108/68 in left arm.  Her initial EKG is normal. I am concerned about possible aortic dissection. I will order a CT of her chest and abdomen and pelvis  dissection protocol to look for possible dissection. CT normal. Patient given nitroglycerin with mild relief. Patient given GI cocktail, however upon swallowing it, pain worsened and she stated squeezing in her throat worsened.  She was breathing well without stridor. I believe her problem is related to esophageal spasm. Given ativan, NTG. Also given procardia and glucagon after speaking with Dr. Gala Romney of GI.  After meds pain is resolving. Medicine admitting for this atypical chest pain.  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    Susan Shipper, MD 10/06/13 2101

## 2013-10-06 NOTE — ED Notes (Signed)
Pt to ct scan.

## 2013-10-06 NOTE — ED Notes (Signed)
Pt states she was at neurologist for follow up appointment for migraines and began having sharp generalized cp radiating into right arm. Denies nausea, states she is having sob but related to pain. Pt maintained of monitors.

## 2013-10-06 NOTE — Consult Note (Signed)
Referring Provider: Rexene Alberts, MD Primary Care Physician:  Lenard Simmer, MD Primary Gastroenterologist:  Dr. Bary Leriche, [place], [place]   Reason for Consultation:  Atypical chest pain  HPI: Susan [name] is a 56 y.o. female presented to the emergency department with acute onset substernal chest pain which radiates into her back. She was sitting in Dr. Freddie Apley office for f/u of migraines when the symptoms started. Describes as if she swallowed a large rock. States she got very hot but no diaphoresis. No vomiting or shortness of breath. Denies abdominal pain. Ate breakfast without difficulty. She has a history of nutcracker's esophagus. Generally swallows without difficulties. Occasionally difficulty swallowing liquids. Denies episode of food or by mouth becoming lodged. She has been swallowing her saliva without difficulty. Chronically constipated. Followed by Dr. Marcelyn Ditty at [facility]. Patient recalls having abnormal sits marker study. Has underwent biofeedback therapy for other issues as well. Reports recent colonoscopy by Dr. Gustavo Lah unremarkable except for diverticulosis and a polyp per patient. Last upper endoscopy over 3 years ago. Recently switched from protonix to [place] for refractory heartburn. She has had an improvement in her heartburn and uses less TUMS. She has had a lot of issues with constipation recently requiring a couple tap water enemas. Does not recall when her last bowel movement was. Denies blood in the stool or melena.   Recently with upper respiratory issues, given K. flex and prednisone 9 days ago. States she has gained 10 pounds with prednisone.    CT angio of chest/abd/pelvis as noted below.   Denies significant ibuprofen use. Rarely takes hyoscyamine.  Prior to Admission medications   Medication Sig Start Date End Date Taking? Authorizing Provider  acetaminophen (TYLENOL) 500 MG tablet Take 500 mg by mouth every 6 (six) hours as needed for  fever.   Yes Historical Provider, MD  ALPRAZolam Duanne Moron) 0.5 MG tablet Take 0.5 mg by mouth 4 (four) times daily as needed. FOR ANXIETY   Yes Historical Provider, MD  amitriptyline (ELAVIL) 25 MG tablet Take 25 mg by mouth at bedtime.   Yes Historical Provider, MD  benzocaine (ORAJEL MOUTH-AID) 20 % GEL Use as directed 1 application in the mouth or throat at bedtime.    Yes Historical Provider, MD  bisacodyl (BISACODYL) 5 MG EC tablet Take 10 mg by mouth daily as needed for moderate constipation.   Yes Historical Provider, MD  carisoprodol (SOMA) 350 MG tablet Take 1 tablet by mouth daily. 07/11/13  Yes Historical Provider, MD  cephALEXin (KEFLEX) 500 MG capsule Take 1 capsule by mouth 2 (two) times daily. 09/26/13  Yes Historical Provider, MD  DEXILANT 60 MG capsule Take 1 capsule by mouth daily. 09/27/13  Yes Historical Provider, MD  eletriptan (RELPAX) 40 MG tablet One tablet by mouth at onset of headache. May repeat in 2 hours if headache persists or recurs. may repeat in 2 hours if necessary   Yes Historical Provider, MD  gabapentin (NEURONTIN) 600 MG tablet Take 600 mg by mouth at bedtime.    Yes Historical Provider, MD  hyoscyamine (NULEV) 0.125 MG TBDP Place 0.125 mg under the tongue every 4 (four) hours as needed.   Yes Historical Provider, MD  ibuprofen (ADVIL,MOTRIN) 200 MG tablet Take 400 mg by mouth every 6 (six) hours as needed for headache or moderate pain.   Yes Historical Provider, MD  levothyroxine (SYNTHROID, LEVOTHROID) 112 MCG tablet Take 112 mcg by mouth every morning.    Yes Historical Provider, MD  magnesium citrate SOLN  Take 1 Bottle by mouth once.   Yes Historical Provider, MD  Multiple Vitamin (MULITIVITAMIN WITH MINERALS) TABS Take 1 tablet by mouth every morning.    Yes Historical Provider, MD  OVER THE COUNTER MEDICATION Place 1 application into both eyes at bedtime.   Yes Historical Provider, MD  Pediatric Multiple Vitamins (CHILDRENS MULTI-VITAMINS PO) Take 2 tablets by  mouth daily.   Yes Historical Provider, MD  Probiotic Product ([place]) Take 1 tablet by mouth every morning.    Yes Historical Provider, MD  simethicone (GAS-X) 80 MG chewable tablet Chew 80 mg by mouth every 6 (six) hours as needed. FOR RELIEF   Yes Historical Provider, MD  SUMAtriptan-naproxen (TREXIMET) 85-500 MG per tablet Take 1 tablet by mouth daily as needed for migraine.   Yes Historical Provider, MD  venlafaxine XR (EFFEXOR-XR) 150 MG 24 hr capsule Take 300 mg by mouth at bedtime.   Yes Historical Provider, MD  fluticasone (FLONASE) 50 MCG/ACT nasal spray Place 2 sprays into the nose daily.    Historical Provider, MD  MINIVELLE 0.1 MG/24HR patch 1 patch 2 (two) times a week.  09/27/13   Historical Provider, MD  Polyethyl Glycol-Propyl Glycol (SYSTANE) 0.4-0.3 % SOLN Apply 1 drop to eye 2 (two) times daily.     Historical Provider, MD  predniSONE (STERAPRED UNI-PAK) 10 MG tablet  09/27/13   Historical Provider, MD    Current Facility-Administered Medications  Medication Dose Route Frequency Provider Last Rate Last Dose  . NIFEdipine (PROCARDIA) capsule 10 mg  10 mg Sublingual Q8H Osvaldo Shipper, MD   10 mg at 10/06/13 1628   Current Outpatient Prescriptions  Medication Sig Dispense Refill  . acetaminophen (TYLENOL) 500 MG tablet Take 500 mg by mouth every 6 (six) hours as needed for fever.      . ALPRAZolam (XANAX) 0.5 MG tablet Take 0.5 mg by mouth 4 (four) times daily as needed. FOR ANXIETY      . amitriptyline (ELAVIL) 25 MG tablet Take 25 mg by mouth at bedtime.      . benzocaine (ORAJEL MOUTH-AID) 20 % GEL Use as directed 1 application in the mouth or throat at bedtime.       . bisacodyl (BISACODYL) 5 MG EC tablet Take 10 mg by mouth daily as needed for moderate constipation.      . carisoprodol (SOMA) 350 MG tablet Take 1 tablet by mouth daily.      . cephALEXin (KEFLEX) 500 MG capsule Take 1 capsule by mouth 2 (two) times daily.      Marland Kitchen DEXILANT 60 MG  capsule Take 1 capsule by mouth daily.      Marland Kitchen eletriptan (RELPAX) 40 MG tablet One tablet by mouth at onset of headache. May repeat in 2 hours if headache persists or recurs. may repeat in 2 hours if necessary      . gabapentin (NEURONTIN) 600 MG tablet Take 600 mg by mouth at bedtime.       . hyoscyamine (NULEV) 0.125 MG TBDP Place 0.125 mg under the tongue every 4 (four) hours as needed.      Marland Kitchen ibuprofen (ADVIL,MOTRIN) 200 MG tablet Take 400 mg by mouth every 6 (six) hours as needed for headache or moderate pain.      Marland Kitchen levothyroxine (SYNTHROID, LEVOTHROID) 112 MCG tablet Take 112 mcg by mouth every morning.       . magnesium citrate SOLN Take 1 Bottle by mouth once.      Marland Kitchen  Multiple Vitamin (MULITIVITAMIN WITH MINERALS) TABS Take 1 tablet by mouth every morning.       Marland Kitchen OVER THE COUNTER MEDICATION Place 1 application into both eyes at bedtime.      . Pediatric Multiple Vitamins (CHILDRENS MULTI-VITAMINS PO) Take 2 tablets by mouth daily.      . Probiotic Product (PHILLIPS COLON HEALTH PO) Take 1 tablet by mouth every morning.       . simethicone (GAS-X) 80 MG chewable tablet Chew 80 mg by mouth every 6 (six) hours as needed. FOR RELIEF      . SUMAtriptan-naproxen (TREXIMET) 85-500 MG per tablet Take 1 tablet by mouth daily as needed for migraine.      . venlafaxine XR (EFFEXOR-XR) 150 MG 24 hr capsule Take 300 mg by mouth at bedtime.      . fluticasone (FLONASE) 50 MCG/ACT nasal spray Place 2 sprays into the nose daily.      Marland Kitchen MINIVELLE 0.1 MG/24HR patch 1 patch 2 (two) times a week.       Vladimir Faster Glycol-Propyl Glycol (SYSTANE) 0.4-0.3 % SOLN Apply 1 drop to eye 2 (two) times daily.       . predniSONE (STERAPRED UNI-PAK) 10 MG tablet         Allergies as of 10/06/2013 - Review Complete 10/06/2013  Allergen Reaction Noted  . Aspirin  02/10/2012  . Vioxx [rofecoxib] Diarrhea 02/10/2012  . Celebrex [celecoxib] Hives 02/10/2012  . Metoclopramide hcl Anxiety 02/10/2012    Past Medical  History  Diagnosis Date  . Celiac sprue   . Hypothyroidism (acquired)   . Fibromyalgia   . Arthritis   . Hyperlipemia   . Migraine   . Thyroid cancer   . Pancreatitis   . GERD (gastroesophageal reflux disease)   . Sjogren's disease   . Chronic anxiety     Past Surgical History  Procedure Laterality Date  . Cholecystectomy    . Abdominal hysterectomy    . Thyroid lobectomy    . Bladder repair    . Nasal sinus surgery      Family History  Problem Relation Age of Onset  . Pancreatitis Neg Hx   . Arthritis/Rheumatoid Mother     died in age 67s  . Colon cancer Neg Hx   . Breast cancer Maternal Grandmother   . Breast cancer Mother   . Breast cancer Maternal Aunt     X 2  . Lung cancer Maternal Aunt     History   Social History  . Marital Status: Married    Spouse Name: N/A    Number of Children: 0  . Years of Education: N/A   Occupational History  . Not on file.   Social History Main Topics  . Smoking status: Never Smoker   . Smokeless tobacco: Not on file  . Alcohol Use: No  . Drug Use: No  . Sexual Activity: Yes    Birth Control/ Protection: Surgical   Other Topics Concern  . Not on file   Social History Narrative  . No narrative on file     ROS:  General: Negative for anorexia, weight loss, fever, chills, fatigue, weakness. Eyes: Negative for vision changes.  ENT: Negative for hoarseness, difficulty swallowing , nasal congestion. CV: Negative for angina, palpitations, dyspnea on exertion, peripheral edema. See history of present illness Respiratory: Negative for dyspnea at rest, dyspnea on exertion, cough, sputum, wheezing. See history of present illness GI: See history of present illness. GU:  Negative for  dysuria, hematuria, urinary incontinence, urinary frequency, nocturnal urination.  MS: Negative for joint pain, low back pain.  Derm: Negative for rash or itching.  Neuro: Negative for weakness, abnormal sensation, seizure, headaches, memory  loss, confusion. Positive migraines Psych: Negative for depression, suicidal ideation, hallucinations. Positive anxiety Endo: Negative for unusual weight change.  Heme: Negative for bruising or bleeding. Allergy: Negative for rash or hives.       Physical Examination: Vital signs in last 24 hours: Temp:  [97.8 F (36.6 C)] 97.8 F (36.6 C) (12/11 1244) Pulse Rate:  [81-85] 85 (12/11 1545) Resp:  [18-20] 18 (12/11 1545) BP: (104-126)/(68-88) 112/83 mmHg (12/11 1628) SpO2:  [94 %-98 %] 94 % (12/11 1545) Weight:  [184 lb (83.462 kg)] 184 lb (83.462 kg) (12/11 1242)    General: Well-nourished, well-developed in no acute distress. Accompanied by husband. Head: Normocephalic, atraumatic.   Eyes: Conjunctiva pink, no icterus. Mouth: Oropharyngeal mucosa moist and pink , no lesions erythema or exudate. Neck: Supple without thyromegaly, masses, or lymphadenopathy.  Lungs: Clear to auscultation bilaterally.  Heart: Regular rate and rhythm, no murmurs rubs or gallops.  Abdomen: Bowel sounds are normal, nontender, nondistended, no hepatosplenomegaly or masses, no abdominal bruits or    hernia , no rebound or guarding.   Rectal: Not performed  Extremities: No lower extremity edema, clubbing, deformity.  Neuro: Alert and oriented x 4 , grossly normal neurologically.  Skin: Warm and dry, no rash or jaundice.   Psych: Alert and cooperative, normal mood and affect.        Intake/Output from previous day:   Intake/Output this shift:    Lab Results: CBC  Recent Labs  10/06/13 1317 10/06/13 1328  WBC 14.7*  --   HGB 14.5 16.7*  HCT 44.3 49.0*  MCV 92.5  --   PLT 354  --    BMET  Recent Labs  10/06/13 1317 10/06/13 1328  NA 137 141  K 4.0 3.7  CL 99 104  CO2 24  --   GLUCOSE 107* 102*  BUN 19 19  CREATININE 0.85 0.90  CALCIUM 9.6  --     Imaging Studies: Ct Angio Chest Aorta W/cm &/or Wo/cm  10/06/2013   CLINICAL DATA:  Chest and back pain.  EXAM: CT ANGIOGRAPHY  CHEST, ABDOMEN AND PELVIS  TECHNIQUE: Multidetector CT imaging through the chest, abdomen and pelvis was performed using the standard protocol during bolus administration of intravenous contrast. Multiplanar reconstructed images including MIPs were obtained and reviewed to evaluate the vascular anatomy.  CONTRAST:  138m OMNIPAQUE IOHEXOL 350 MG/ML SOLN  COMPARISON:  CT scan of February 11, 2012.  FINDINGS: CTA CHEST FINDINGS  No pleural effusion or pneumothorax is noted. No abnormality is seen in the pulmonary parenchyma. No mediastinal mass or adenopathy is noted. The thoracic aorta appears normal without evidence of aneurysm or dissection. The proximal great vessels appear normal. The pulmonary artery and its branches appear normal. No definite osseous abnormality is noted in the chest.  Review of the MIP images confirms the above findings.  CTA ABDOMEN AND PELVIS FINDINGS  The abdominal aorta and its mesenteric and renal branches appear widely patent. Mild atherosclerotic calcifications of abdominal aorta are noted without aneurysm or dissection. The visualized portions of the iliac arteries appear normal. Status post cholecystectomy. The liver, spleen and pancreas appear normal. Adrenal glands and kidneys appear normal. No evidence of bowel obstruction is noted. No abnormal fluid collection is noted. The appendix appears normal. Urinary bladder appears normal. No  osseous abnormality is noted.  Review of the MIP images confirms the above findings.  IMPRESSION: No evidence of aneurysm or dissection seen involving the thoracic or abdominal aorta.   Electronically Signed   By: Sabino Dick M.D.   On: 10/06/2013 15:16   Ct Angio Abd/pel W/ And/or W/o  10/06/2013   CLINICAL DATA:  Chest and back pain.  EXAM: CT ANGIOGRAPHY CHEST, ABDOMEN AND PELVIS  TECHNIQUE: Multidetector CT imaging through the chest, abdomen and pelvis was performed using the standard protocol during bolus administration of intravenous contrast.  Multiplanar reconstructed images including MIPs were obtained and reviewed to evaluate the vascular anatomy.  CONTRAST:  119m OMNIPAQUE IOHEXOL 350 MG/ML SOLN  COMPARISON:  CT scan of February 11, 2012.  FINDINGS: CTA CHEST FINDINGS  No pleural effusion or pneumothorax is noted. No abnormality is seen in the pulmonary parenchyma. No mediastinal mass or adenopathy is noted. The thoracic aorta appears normal without evidence of aneurysm or dissection. The proximal great vessels appear normal. The pulmonary artery and its branches appear normal. No definite osseous abnormality is noted in the chest.  Review of the MIP images confirms the above findings.  CTA ABDOMEN AND PELVIS FINDINGS  The abdominal aorta and its mesenteric and renal branches appear widely patent. Mild atherosclerotic calcifications of abdominal aorta are noted without aneurysm or dissection. The visualized portions of the iliac arteries appear normal. Status post cholecystectomy. The liver, spleen and pancreas appear normal. Adrenal glands and kidneys appear normal. No evidence of bowel obstruction is noted. No abnormal fluid collection is noted. The appendix appears normal. Urinary bladder appears normal. No osseous abnormality is noted.  Review of the MIP images confirms the above findings.  IMPRESSION: No evidence of aneurysm or dissection seen involving the thoracic or abdominal aorta.   Electronically Signed   By: JSabino DickM.D.   On: 10/06/2013 15:16  [4 week]   Impression: 57year old lady with acute onset substernal chest pain with radiation into the back. No preceding dysphagia. Typically swallows. She has had some refractory GERD recently, PPIs switched as outlined above. CTA has ruled out aneurysm or dissection at this point. No clinical evidence of esophageal obstruction. Remains symptomatic after GI cocktail, glucagon, morphine, Procardia, nitroglycerin x3. ?anxiety overlay? Given extensive GI history and recent prednisone use,  refractory GERD, would offer her evaluation of her upper GI tract.   Plan: 1. Agree with overnight admission. Symptomatic therapy. 2. Clear liquid diet, n.p.o. after clear liquid breakfast. . 3. IV PPI therapy. 4. Tentatively plan for upper endoscopy tomorrow.  I would like to thank Dr. WGeorgena Spurlingfor allowing [place] take part in the care of this very nice patient.   LOS: 0 days   LNeil Crouch 10/06/2013, 5:10 PM  Attending note: Pt seen and examined in the ED; discussed with Dr. FCaryn Section agree with impression and plan

## 2013-10-06 NOTE — ED Notes (Signed)
GI at bedside

## 2013-10-06 NOTE — ED Notes (Addendum)
Pt c/o of sensation of inability to swallow and breath s/p gi cocktail. Pt's respirations non labored. MD called to bedside. No stridor noted.

## 2013-10-07 ENCOUNTER — Encounter (HOSPITAL_COMMUNITY): Payer: Self-pay | Admitting: *Deleted

## 2013-10-07 ENCOUNTER — Encounter (HOSPITAL_COMMUNITY)
Admission: EM | Disposition: A | Discharge status: Home / Self Care | Payer: Self-pay | Source: Home / Self Care | Attending: Internal Medicine

## 2013-10-07 DIAGNOSIS — R001 Bradycardia, unspecified: Secondary | ICD-10-CM | POA: Diagnosis not present

## 2013-10-07 DIAGNOSIS — I498 Other specified cardiac arrhythmias: Secondary | ICD-10-CM

## 2013-10-07 DIAGNOSIS — J45909 Unspecified asthma, uncomplicated: Secondary | ICD-10-CM

## 2013-10-07 DIAGNOSIS — K2960 Other gastritis without bleeding: Secondary | ICD-10-CM

## 2013-10-07 DIAGNOSIS — R079 Chest pain, unspecified: Secondary | ICD-10-CM

## 2013-10-07 DIAGNOSIS — I519 Heart disease, unspecified: Secondary | ICD-10-CM

## 2013-10-07 HISTORY — PX: ESOPHAGOGASTRODUODENOSCOPY: SHX5428

## 2013-10-07 LAB — CBC
HCT: 39.3 % (ref 36.0–46.0)
Hemoglobin: 12.6 g/dL (ref 12.0–15.0)
MCH: 30.1 pg (ref 26.0–34.0)
MCHC: 32.1 g/dL (ref 30.0–36.0)
Platelets: 304 10*3/uL (ref 150–400)
RDW: 14.3 % (ref 11.5–15.5)
WBC: 12.4 10*3/uL — ABNORMAL HIGH (ref 4.0–10.5)

## 2013-10-07 LAB — BASIC METABOLIC PANEL
BUN: 15 mg/dL (ref 6–23)
Calcium: 8.7 mg/dL (ref 8.4–10.5)
Creatinine, Ser: 0.91 mg/dL (ref 0.50–1.10)
GFR calc Af Amer: 79 mL/min — ABNORMAL LOW (ref >90)
GFR calc non Af Amer: 68 mL/min — ABNORMAL LOW (ref >90)
Glucose, Bld: 85 mg/dL (ref 70–99)

## 2013-10-07 LAB — HEPATIC FUNCTION PANEL
ALT: 25 U/L (ref 0–35)
Bilirubin, Direct: <0.1 mg/dL (ref 0.0–0.3)
Total Protein: 6.9 g/dL (ref 6.0–8.3)

## 2013-10-07 LAB — TSH: TSH: 0.962 u[IU]/mL (ref 0.350–4.500)

## 2013-10-07 LAB — TROPONIN I: Troponin I: <0.3 ng/mL (ref <0.30)

## 2013-10-07 SURGERY — EGD (ESOPHAGOGASTRODUODENOSCOPY)
Anesthesia: Moderate Sedation

## 2013-10-07 MED ORDER — SODIUM CHLORIDE 0.9 % IJ SOLN
INTRAMUSCULAR | Status: AC
  Filled 2013-10-07: qty 10

## 2013-10-07 MED ORDER — SODIUM CHLORIDE 0.9 % IV SOLN
INTRAVENOUS | Status: DC
  Administered 2013-10-07: 14:00:00 via INTRAVENOUS

## 2013-10-07 MED ORDER — MEPERIDINE HCL 100 MG/ML IJ SOLN
INTRAMUSCULAR | Status: DC | PRN
  Administered 2013-10-07: 25 mg via INTRAVENOUS
  Administered 2013-10-07 (×2): 50 mg via INTRAVENOUS

## 2013-10-07 MED ORDER — MEPERIDINE HCL 100 MG/ML IJ SOLN
INTRAMUSCULAR | Status: AC
  Filled 2013-10-07: qty 2

## 2013-10-07 MED ORDER — MIDAZOLAM HCL 5 MG/5ML IJ SOLN
INTRAMUSCULAR | Status: DC | PRN
  Administered 2013-10-07: 1 mg via INTRAVENOUS
  Administered 2013-10-07 (×3): 2 mg via INTRAVENOUS

## 2013-10-07 MED ORDER — PROMETHAZINE HCL 25 MG/ML IJ SOLN
25.0000 mg | Freq: Once | INTRAMUSCULAR | Status: AC
  Administered 2013-10-07: 25 mg via INTRAVENOUS

## 2013-10-07 MED ORDER — BUTAMBEN-TETRACAINE-BENZOCAINE 2-2-14 % EX AERO
INHALATION_SPRAY | CUTANEOUS | Status: DC | PRN
  Administered 2013-10-07: 2 via TOPICAL

## 2013-10-07 MED ORDER — ARTIFICIAL TEARS OP OINT
TOPICAL_OINTMENT | Freq: Every day | OPHTHALMIC | Status: DC
  Administered 2013-10-07: 22:00:00 via OPHTHALMIC
  Filled 2013-10-07: qty 3.5

## 2013-10-07 MED ORDER — STERILE WATER FOR IRRIGATION IR SOLN
Status: DC | PRN
  Administered 2013-10-07: 15:00:00

## 2013-10-07 MED ORDER — MIDAZOLAM HCL 5 MG/5ML IJ SOLN
INTRAMUSCULAR | Status: AC
  Filled 2013-10-07: qty 10

## 2013-10-07 MED ORDER — POLYVINYL ALCOHOL 1.4 % OP SOLN
2.0000 [drp] | Freq: Two times a day (BID) | OPHTHALMIC | Status: DC
  Administered 2013-10-07 – 2013-10-08 (×3): 2 [drp] via OPHTHALMIC
  Filled 2013-10-07: qty 15

## 2013-10-07 MED ORDER — GABAPENTIN 300 MG PO CAPS
600.0000 mg | ORAL_CAPSULE | Freq: Every day | ORAL | Status: DC
  Administered 2013-10-07: 600 mg via ORAL
  Filled 2013-10-07: qty 2

## 2013-10-07 MED ORDER — PANTOPRAZOLE SODIUM 40 MG PO TBEC
40.0000 mg | DELAYED_RELEASE_TABLET | Freq: Two times a day (BID) | ORAL | Status: DC
  Administered 2013-10-07 – 2013-10-08 (×2): 40 mg via ORAL
  Filled 2013-10-07 (×2): qty 1

## 2013-10-07 MED ORDER — PROMETHAZINE HCL 25 MG/ML IJ SOLN
INTRAMUSCULAR | Status: AC
  Filled 2013-10-07: qty 1

## 2013-10-07 MED ORDER — CARISOPRODOL 350 MG PO TABS
350.0000 mg | ORAL_TABLET | Freq: Every day | ORAL | Status: DC
  Administered 2013-10-07: 350 mg via ORAL
  Filled 2013-10-07: qty 1

## 2013-10-07 NOTE — Op Note (Signed)
IV discontinued left arm patient complained of soreness and there was marked streaks going up arm.  New IV started in left arm per Meagan White,RN.  During procedure, after an administration of Demerol, streaks noticed from new site. Dr. Oneida Alar aware and cold compress applied

## 2013-10-07 NOTE — Op Note (Signed)
[facility] [address] [place], [id number]   ENDOSCOPY PROCEDURE REPORT  PATIENT: Susan [name], Susan [name]  MR#: [id number] BIRTHDATE: 1956-06-23 , 57  yrs. old GENDER: Female  ENDOSCOPIST: Barney Drain, MD REFERRED BY:   Lenard Simmer, MD, Dr. Bary Leriche, [place], [place] PROCEDURE DATE: 10/07/2013 PROCEDURE:   EGD w/ biopsy  INDICATIONS:Chest pain.   Dysphagia. PMHX: SJOGREN;S, NUTCRACKER ESOPHAGUS, GERD. NEVER HAD EGD/DIL MEDICATIONS: Demerol 125 mg IV, Versed 7 mg IV, and PREOP: Promethazine (Phenergan) 71m IV TOPICAL ANESTHETIC:   Cetacaine Spray: MADE PT FEEL SOB  DESCRIPTION OF PROCEDURE:     Physical exam was performed.  Informed consent was obtained from the patient after explaining the benefits, risks, and alternatives to the procedure.  The patient was connected to the monitor and placed in the left lateral position.  Continuous oxygen was provided by nasal cannula and IV medicine administered through an indwelling cannula.  After administration of sedation, the patients esophagus was intubated and the [id number] (([id number]  endoscope was advanced under direct visualization to the second portion of the duodenum.  The scope was removed slowly by carefully examining the color, texture, anatomy, and integrity of the mucosa on the way out.  The patient was recovered in endoscopy and discharged home in satisfactory condition.  ESOPHAGUS: The mucosa of the esophagus appeared normal.  Multiple biopsies were performed.  18 CMA ND 34 CM FROM THE TEETH. GE JXN 39 CM FROM THE TEETH. STOMACH: Mild non-erosive gastritis (inflammation) was found.  Multiple biopsies were performed using cold forceps.   DUODENUM: The duodenal mucosa showed no abnormalities in the bulb and second portion of the duodenum. COMPLICATIONS:   None  ENDOSCOPIC IMPRESSION: 1.   NORMAL ESOPHAGUS 2.   MILD Non-erosive gastritis 3.   CHEST PAIN MOST LIKELY DUE TO ESOPHAGEAL  SPASM/GERD  RECOMMENDATIONS: ADVANCE DIET BID PPI AWAIT BIOPSY OPV [place].     REPEAT EXAM:   _______________________________ eLorrin MaisBarney Drain MD 10/07/2013 5:14 PM       PATIENT NAME:  GRuchama, KubicekMR#: [id number]

## 2013-10-07 NOTE — Progress Notes (Signed)
[facility] PROGRESS NOTE  Susan [name] HWY:[id number] DOB: 16-Apr-1956 DOA: 10/06/2013 PCP: Lenard Simmer, MD    Code Status: Full code Family Communication: No family available this morning, I did speak with the patient's husband yesterday. Disposition Plan: Plan to discharge to home later on today or tomorrow morning.   Consultants:  Gastroenterology  Neurology pending.  Procedures:  None yet.  Antibiotics: None   HPI/Subjective: The patient had very little sleep, but overall she still feels better. She has no chest pain or a burning sensation in her chest. She has no shortness of breath. She did have a mild headache overnight which was treated with analgesics.  Objective: Filed Vitals:   10/07/13 0531  BP: 116/82  Pulse: 50  Temp: 98 F (36.7 C)  Resp: 18   No intake or output data in the 24 hours ending 10/07/13 0816 Filed Weights   10/06/13 1242 10/06/13 1933  Weight: 83.462 kg (184 lb) 83.462 kg (184 lb)    Exam:   General:  57 year-old woman, in no acute distress.  Cardiovascular: S1, S2, with borderline bradycardia.  Respiratory: Clear to auscultation bilaterally.  Abdomen: Positive bowel sounds, soft, nontender, nondistended.  Musculoskeletal/extremities: Pedal pulses palpable. No pedal edema.  Psych/neuro. She is alert and oriented x3. No anxiousness. Her speech is clear. Pleasant affect.  Data Reviewed: Basic Metabolic Panel:  Recent Labs Lab 10/06/13 1317 10/06/13 1328 10/07/13 0602  NA 137 141 138  K 4.0 3.7 4.3  CL 99 104 103  CO2 24  --  26  GLUCOSE 107* 102* 85  BUN 19 19 15   CREATININE 0.85 0.90 0.91  CALCIUM 9.6  --  8.7   Liver Function Tests:  Recent Labs Lab 10/07/13 0602  AST 15  ALT 25  ALKPHOS 94  BILITOT 0.3  PROT 6.9  ALBUMIN 3.3*    Recent Labs Lab 10/06/13 1317  LIPASE 29   No results found for this basename: AMMONIA,  in the last 168 hours CBC:  Recent Labs Lab 10/06/13 1317  10/06/13 1328 10/07/13 0602  WBC 14.7*  --  12.4*  HGB 14.5 16.7* 12.6  HCT 44.3 49.0* 39.3  MCV 92.5  --  93.8  PLT 354  --  304   Cardiac Enzymes:  Recent Labs Lab 10/06/13 1701 10/07/13 0602  TROPONINI <0.30 <0.30   BNP (last 3 results)  Recent Labs  10/06/13 1317  PROBNP 40.9   CBG: No results found for this basename: GLUCAP,  in the last 168 hours  No results found for this or any previous visit (from the past 240 hour(s)).   Studies: Ct Angio Chest Aorta W/cm &/or Wo/cm  10/06/2013   CLINICAL DATA:  Chest and back pain.  EXAM: CT ANGIOGRAPHY CHEST, ABDOMEN AND PELVIS  TECHNIQUE: Multidetector CT imaging through the chest, abdomen and pelvis was performed using the standard protocol during bolus administration of intravenous contrast. Multiplanar reconstructed images including MIPs were obtained and reviewed to evaluate the vascular anatomy.  CONTRAST:  181m OMNIPAQUE IOHEXOL 350 MG/ML SOLN  COMPARISON:  CT scan of February 11, 2012.  FINDINGS: CTA CHEST FINDINGS  No pleural effusion or pneumothorax is noted. No abnormality is seen in the pulmonary parenchyma. No mediastinal mass or adenopathy is noted. The thoracic aorta appears normal without evidence of aneurysm or dissection. The proximal great vessels appear normal. The pulmonary artery and its branches appear normal. No definite osseous abnormality is noted in the chest.  Review of the MIP images  confirms the above findings.  CTA ABDOMEN AND PELVIS FINDINGS  The abdominal aorta and its mesenteric and renal branches appear widely patent. Mild atherosclerotic calcifications of abdominal aorta are noted without aneurysm or dissection. The visualized portions of the iliac arteries appear normal. Status post cholecystectomy. The liver, spleen and pancreas appear normal. Adrenal glands and kidneys appear normal. No evidence of bowel obstruction is noted. No abnormal fluid collection is noted. The appendix appears normal. Urinary  bladder appears normal. No osseous abnormality is noted.  Review of the MIP images confirms the above findings.  IMPRESSION: No evidence of aneurysm or dissection seen involving the thoracic or abdominal aorta.   Electronically Signed   By: Sabino Dick M.D.   On: 10/06/2013 15:16   Ct Angio Abd/pel W/ And/or W/o  10/06/2013   CLINICAL DATA:  Chest and back pain.  EXAM: CT ANGIOGRAPHY CHEST, ABDOMEN AND PELVIS  TECHNIQUE: Multidetector CT imaging through the chest, abdomen and pelvis was performed using the standard protocol during bolus administration of intravenous contrast. Multiplanar reconstructed images including MIPs were obtained and reviewed to evaluate the vascular anatomy.  CONTRAST:  161m OMNIPAQUE IOHEXOL 350 MG/ML SOLN  COMPARISON:  CT scan of February 11, 2012.  FINDINGS: CTA CHEST FINDINGS  No pleural effusion or pneumothorax is noted. No abnormality is seen in the pulmonary parenchyma. No mediastinal mass or adenopathy is noted. The thoracic aorta appears normal without evidence of aneurysm or dissection. The proximal great vessels appear normal. The pulmonary artery and its branches appear normal. No definite osseous abnormality is noted in the chest.  Review of the MIP images confirms the above findings.  CTA ABDOMEN AND PELVIS FINDINGS  The abdominal aorta and its mesenteric and renal branches appear widely patent. Mild atherosclerotic calcifications of abdominal aorta are noted without aneurysm or dissection. The visualized portions of the iliac arteries appear normal. Status post cholecystectomy. The liver, spleen and pancreas appear normal. Adrenal glands and kidneys appear normal. No evidence of bowel obstruction is noted. No abnormal fluid collection is noted. The appendix appears normal. Urinary bladder appears normal. No osseous abnormality is noted.  Review of the MIP images confirms the above findings.  IMPRESSION: No evidence of aneurysm or dissection seen involving the thoracic or  abdominal aorta.   Electronically Signed   By: JSabino DickM.D.   On: 10/06/2013 15:16    Scheduled Meds: . albuterol  2 puff Inhalation BID  . amitriptyline  25 mg Oral QHS  . fluticasone  2 spray Each Nare Daily  . gabapentin  600 mg Oral QHS  . levothyroxine  112 mcg Oral QAC breakfast  . pantoprazole (PROTONIX) IV  40 mg Intravenous Q12H  . promethazine  25 mg Intravenous Once  . venlafaxine XR  300 mg Oral QHS   Continuous Infusions: . 0.9 % NaCl with KCl 20 mEq / L 65 mL/hr at 10/07/13 0350    Assessment:  Principal Problem:   Substernal precordial chest pain Active Problems:   GERD (gastroesophageal reflux disease)   Nutcracker esophagus   Asthmatic bronchitis   Depression with anxiety   Fibromyalgia   Hypothyroidism (acquired)   Chronic migraine   Bradycardia    1. Substernal precordial chest pain. She has ruled out for myocardial infarction with negative troponin I x3. Her chest pain is atypical and likely GI in origin. She is chest pain-free now.  Mild bradycardia. This was noted during her sleep. Her heart rate is borderline bradycardic on exam. Will assess  her TSH. Otherwise will continue to follow.  GERD/history of Nutcracker esophagus. We'll continue IV Protonix every 12 hours. We'll hold NSAIDs. Possible EGD today per gastroenterology.  Chronic migraine headaches/chronic fibromyalgia. We'll hold Treximet because it has naproxen in it. (The patient does not want to stop this medication permanently because it does help). Continue gabapentin and amitriptyline. We'll continue as needed opiate analgesics. We'll await Dr. Merlene Laughter assessment and recommendations.  Depression with anxiety. Will continue as needed Xanax and Effexor.  Hypothyroidism. We'll continue Synthroid. We'll order TSH.  Asthmatic bronchitis. Currently stable. We'll continue albuterol inhaler.  Leukocytosis. This is likely residual from recent steroid therapy.     Plan: 1. Continue  gentle IV fluids and supportive treatment. 2. Clear liquid diet for breakfast and then n.p.o. after breakfast. Will await GI followup evaluation. Query EGD earlier today. 3. 2-D echocardiogram ordered and is pending. 4. Order TSH for evaluation of hypothyroidism and bradycardia. 5. Neurology consultation ordered and pending. 6. Disposition possibly later on today versus in the morning, depending on workup and/or results..  Time spent: 35 minutes    [facility] Pager [phone] If 7PM-7AM, please contact night-coverage at [email or web address], password [facility] 10/07/2013, 8:16 AM  LOS: 1 day

## 2013-10-07 NOTE — Progress Notes (Signed)
Utilization Review Complete  

## 2013-10-07 NOTE — Progress Notes (Signed)
*  PRELIMINARY RESULTS* Echocardiogram 2D Echocardiogram has been performed.  [name], Susan 10/07/2013, 10:18 AM

## 2013-10-07 NOTE — H&P (Addendum)
Primary Care Physician:  Lenard Simmer, MD Primary Gastroenterologist:  Dr. Oneida Alar  Pre-Procedure History & Physical: HPI:  Susan [name] is a 57 y.o. female here for DYSPEPSIA/CHEST PAIN.  Past Medical History  Diagnosis Date  . Celiac sprue   . Hypothyroidism (acquired)   . Fibromyalgia   . Arthritis   . Hyperlipemia   . Migraine   . Thyroid cancer   . Pancreatitis   . GERD (gastroesophageal reflux disease)   . Sjogren's disease   . Chronic anxiety   . Asthmatic bronchitis   . 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 Spastic colon   . Hyperlipidemia     Intolerant to statins    Past Surgical History  Procedure Laterality Date  . Cholecystectomy    . Abdominal hysterectomy    . Thyroid lobectomy    . Bladder repair    . Nasal sinus surgery      Prior to Admission medications   Medication Sig Start Date End Date Taking? Authorizing Provider  acetaminophen (TYLENOL) 500 MG tablet Take 500 mg by mouth every 6 (six) hours as needed for fever.   Yes Historical Provider, MD  ALPRAZolam Duanne Moron) 0.5 MG tablet Take 0.5 mg by mouth 4 (four) times daily as needed. FOR ANXIETY   Yes Historical Provider, MD  amitriptyline (ELAVIL) 25 MG tablet Take 25 mg by mouth at bedtime.   Yes Historical Provider, MD  benzocaine (ORAJEL MOUTH-AID) 20 % GEL Use as directed 1 application in the mouth or throat at bedtime.    Yes Historical Provider, MD  bisacodyl (BISACODYL) 5 MG EC tablet Take 10 mg by mouth daily as needed for moderate constipation.   Yes Historical Provider, MD  carisoprodol (SOMA) 350 MG tablet Take 1 tablet by mouth daily. 07/11/13  Yes Historical Provider, MD  cephALEXin (KEFLEX) 500 MG capsule Take 1 capsule by mouth 2 (two) times daily. 09/26/13  Yes Historical Provider, MD  DEXILANT 60 MG capsule Take 1 capsule by mouth daily. 09/27/13  Yes Historical Provider, MD  eletriptan (RELPAX) 40 MG tablet One tablet by mouth at onset of headache. May repeat in 2 hours if headache  persists or recurs. may repeat in 2 hours if necessary   Yes Historical Provider, MD  gabapentin (NEURONTIN) 600 MG tablet Take 600 mg by mouth at bedtime.    Yes Historical Provider, MD  hyoscyamine (NULEV) 0.125 MG TBDP Place 0.125 mg under the tongue every 4 (four) hours as needed.   Yes Historical Provider, MD  ibuprofen (ADVIL,MOTRIN) 200 MG tablet Take 400 mg by mouth every 6 (six) hours as needed for headache or moderate pain.   Yes Historical Provider, MD  levothyroxine (SYNTHROID, LEVOTHROID) 112 MCG tablet Take 112 mcg by mouth every morning.    Yes Historical Provider, MD  magnesium citrate SOLN Take 1 Bottle by mouth once.   Yes Historical Provider, MD  Multiple Vitamin (MULITIVITAMIN WITH MINERALS) TABS Take 1 tablet by mouth every morning.    Yes Historical Provider, MD  OVER THE COUNTER MEDICATION Place 1 application into both eyes at bedtime.   Yes Historical Provider, MD  Pediatric Multiple Vitamins (CHILDRENS MULTI-VITAMINS PO) Take 2 tablets by mouth daily.   Yes Historical Provider, MD  Probiotic Product ([place]) Take 1 tablet by mouth every morning.    Yes Historical Provider, MD  simethicone (GAS-X) 80 MG chewable tablet Chew 80 mg by mouth every 6 (six) hours as needed. FOR RELIEF   Yes Historical Provider,  MD  SUMAtriptan-naproxen (TREXIMET) 85-500 MG per tablet Take 1 tablet by mouth daily as needed for migraine.   Yes Historical Provider, MD  venlafaxine XR (EFFEXOR-XR) 150 MG 24 hr capsule Take 300 mg by mouth at bedtime.   Yes Historical Provider, MD  fluticasone (FLONASE) 50 MCG/ACT nasal spray Place 2 sprays into the nose daily.    Historical Provider, MD  MINIVELLE 0.1 MG/24HR patch 1 patch 2 (two) times a week.  09/27/13   Historical Provider, MD  Polyethyl Glycol-Propyl Glycol (SYSTANE) 0.4-0.3 % SOLN Apply 1 drop to eye 2 (two) times daily.     Historical Provider, MD  predniSONE (STERAPRED UNI-PAK) 10 MG tablet  09/27/13   Historical Provider, MD     Allergies as of 10/06/2013 - Review Complete 10/06/2013  Allergen Reaction Noted  . Aspirin  02/10/2012  . Vioxx [rofecoxib] Diarrhea 02/10/2012  . Celebrex [celecoxib] Hives 02/10/2012  . Metoclopramide hcl Anxiety 02/10/2012    Family History  Problem Relation Age of Onset  . Pancreatitis Neg Hx   . Arthritis/Rheumatoid Mother     died in age 65s  . Colon cancer Neg Hx   . Breast cancer Maternal Grandmother   . Breast cancer Mother   . Breast cancer Maternal Aunt     X 2  . Lung cancer Maternal Aunt     History   Social History  . Marital Status: Married    Spouse Name: N/A    Number of Children: 0  . Years of Education: N/A   Occupational History  . Not on file.   Social History Main Topics  . Smoking status: Never Smoker   . Smokeless tobacco: Not on file  . Alcohol Use: No  . Drug Use: No  . Sexual Activity: Yes    Birth Control/ Protection: Surgical   Other Topics Concern  . Not on file   Social History Narrative  . No narrative on file    Review of Systems: See HPI, otherwise negative ROS   Physical Exam: BP 129/84  Pulse 77  Temp(Src) 98.5 F (36.9 C) (Oral)  Resp 18  Ht 5' 4"  (1.626 m)  Wt 184 lb (83.462 kg)  BMI 31.57 kg/m2  SpO2 97% General:   Alert,  pleasant and cooperative in NAD Head:  Normocephalic and atraumatic. Neck:  Supple; Lungs:  Clear throughout to auscultation.    Heart:  Regular rate and rhythm. Abdomen:  Soft, nontender and nondistended. Normal bowel sounds, without guarding, and without rebound.   Neurologic:  Alert and  oriented x4;  grossly normal neurologically.  Impression/Plan:     DYSPEPSIA/CHEST PAIN  PLAN:  EGD TODAY

## 2013-10-07 NOTE — Consult Note (Signed)
[place] A. Merlene Laughter, MD     [email or web address]          Susan [name] is an 57 y.o. female.   ASSESSMENT/PLAN:  1. Episodic headaches likely migrainous in nature. The patient headaches are likely aggravated by insomnia. She has responded well previously to [employer] at bedtime. This will be restarted. She can take as needed Tylenol and low-dose narcotics if needed. Ultram may be used if needed.  2. Significant insomnia.  3. Chest pain and thoracic pain thought due to GI symptoms/GERD/esophageal spasm.  This is a 57 year old white female who is well known to my service in outpatient setting. She was in the office being evaluated for her baseline episodic headaches, insomnia and chronic pain problems. The patient developed rather acute onset of posterior thoracic pain/shoulder pain radiating to the anterior chest region. She was to go to the hospital for evaluation. She has been treated for acute bronchitis by primary care provider the previous day. She has had some productive cough and wheezing although this was not present during the evaluation. The patient does not complain of dyspnea although chest discomfort. No fevers are reported. The patient reports that her headaches have actually gotten better since she has been taking Soma to help with sleep consolidation. The patient ran out of the [place] but 3 days ago and has not slept since then. Other medications apparently did not help with sleep consolidation.  The review of systems otherwise negative.  GENERAL: This is a pleasant female who is currently in no acute distress. She appears to be in some discomfort however.  HEENT: This is normocephalic and atraumatic. Neck is supple.  ABDOMEN: soft  EXTREMITIES: No edema   BACK: Unremarkable.  SKIN: Normal by inspection.    MENTAL STATUS: Alert and oriented. Speech, language and cognition are generally intact. Judgment and insight normal.   CRANIAL NERVES: Pupils are  equal, round and reactive to light and accommodation; extra ocular movements are full, there is no significant nystagmus; visual fields are full; upper and lower facial muscles are normal in strength and symmetric, there is no flattening of the nasolabial folds; tongue is midline; uvula is midline; shoulder elevation is normal.  MOTOR: Normal tone, bulk and strength; no pronator drift.  COORDINATION: Left finger to nose is normal, right finger to nose is normal, No rest tremor; no intention tremor; no postural tremor; no bradykinesia.  REFLEXES: Deep tendon reflexes are symmetrical and normal. Plantar responses are flexor bilaterally.   SENSATION: Normal to light touch.  GAIT: Normal.     Past Medical History  Diagnosis Date  . Celiac sprue   . Hypothyroidism (acquired)   . Fibromyalgia   . Arthritis   . Hyperlipemia   . Migraine   . Thyroid cancer   . Pancreatitis   . GERD (gastroesophageal reflux disease)   . Sjogren's disease   . Chronic anxiety   . Asthmatic bronchitis   . 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 Spastic colon   . Hyperlipidemia     Intolerant to statins    Past Surgical History  Procedure Laterality Date  . Cholecystectomy    . Abdominal hysterectomy    . Thyroid lobectomy    . Bladder repair    . Nasal sinus surgery      Family History  Problem Relation Age of Onset  . Pancreatitis Neg Hx   . Arthritis/Rheumatoid Mother     died in age 73s  . Colon cancer Neg Hx   .  Breast cancer Maternal Grandmother   . Breast cancer Mother   . Breast cancer Maternal Aunt     X 2  . Lung cancer Maternal Aunt     Social History:  reports that she has never smoked. She does not have any smokeless tobacco history on file. She reports that she does not drink alcohol or use illicit drugs.  Allergies:  Allergies  Allergen Reactions  . Aspirin   . Vioxx [Rofecoxib] Diarrhea  . Celebrex [Celecoxib] Hives  . Metoclopramide Hcl Anxiety    BODY TREMORS     Medications: Prior to Admission medications   Medication Sig Start Date End Date Taking? Authorizing Provider  acetaminophen (TYLENOL) 500 MG tablet Take 500 mg by mouth every 6 (six) hours as needed for fever.   Yes Historical Provider, MD  ALPRAZolam Duanne Moron) 0.5 MG tablet Take 0.5 mg by mouth 4 (four) times daily as needed. FOR ANXIETY   Yes Historical Provider, MD  amitriptyline (ELAVIL) 25 MG tablet Take 25 mg by mouth at bedtime.   Yes Historical Provider, MD  benzocaine (ORAJEL MOUTH-AID) 20 % GEL Use as directed 1 application in the mouth or throat at bedtime.    Yes Historical Provider, MD  bisacodyl (BISACODYL) 5 MG EC tablet Take 10 mg by mouth daily as needed for moderate constipation.   Yes Historical Provider, MD  carisoprodol (SOMA) 350 MG tablet Take 1 tablet by mouth daily. 07/11/13  Yes Historical Provider, MD  cephALEXin (KEFLEX) 500 MG capsule Take 1 capsule by mouth 2 (two) times daily. 09/26/13  Yes Historical Provider, MD  DEXILANT 60 MG capsule Take 1 capsule by mouth daily. 09/27/13  Yes Historical Provider, MD  eletriptan (RELPAX) 40 MG tablet One tablet by mouth at onset of headache. May repeat in 2 hours if headache persists or recurs. may repeat in 2 hours if necessary   Yes Historical Provider, MD  gabapentin (NEURONTIN) 600 MG tablet Take 600 mg by mouth at bedtime.    Yes Historical Provider, MD  hyoscyamine (NULEV) 0.125 MG TBDP Place 0.125 mg under the tongue every 4 (four) hours as needed.   Yes Historical Provider, MD  ibuprofen (ADVIL,MOTRIN) 200 MG tablet Take 400 mg by mouth every 6 (six) hours as needed for headache or moderate pain.   Yes Historical Provider, MD  levothyroxine (SYNTHROID, LEVOTHROID) 112 MCG tablet Take 112 mcg by mouth every morning.    Yes Historical Provider, MD  magnesium citrate SOLN Take 1 Bottle by mouth once.   Yes Historical Provider, MD  Multiple Vitamin (MULITIVITAMIN WITH MINERALS) TABS Take 1 tablet by mouth every morning.     Yes Historical Provider, MD  OVER THE COUNTER MEDICATION Place 1 application into both eyes at bedtime.   Yes Historical Provider, MD  Pediatric Multiple Vitamins (CHILDRENS MULTI-VITAMINS PO) Take 2 tablets by mouth daily.   Yes Historical Provider, MD  Probiotic Product (Mulberry) Take 1 tablet by mouth every morning.    Yes Historical Provider, MD  simethicone (GAS-X) 80 MG chewable tablet Chew 80 mg by mouth every 6 (six) hours as needed. FOR RELIEF   Yes Historical Provider, MD  SUMAtriptan-naproxen (TREXIMET) 85-500 MG per tablet Take 1 tablet by mouth daily as needed for migraine.   Yes Historical Provider, MD  venlafaxine XR (EFFEXOR-XR) 150 MG 24 hr capsule Take 300 mg by mouth at bedtime.   Yes Historical Provider, MD  fluticasone (FLONASE) 50 MCG/ACT nasal spray Place 2 sprays into the  nose daily.    Historical Provider, MD  MINIVELLE 0.1 MG/24HR patch 1 patch 2 (two) times a week.  09/27/13   Historical Provider, MD  Polyethyl Glycol-Propyl Glycol (SYSTANE) 0.4-0.3 % SOLN Apply 1 drop to eye 2 (two) times daily.     Historical Provider, MD  predniSONE (STERAPRED UNI-PAK) 10 MG tablet  09/27/13   Historical Provider, MD    Scheduled Meds: . albuterol  2 puff Inhalation BID  . amitriptyline  25 mg Oral QHS  . fluticasone  2 spray Each Nare Daily  . gabapentin  600 mg Oral QHS  . levothyroxine  112 mcg Oral QAC breakfast  . pantoprazole (PROTONIX) IV  40 mg Intravenous Q12H  . promethazine  25 mg Intravenous Once  . venlafaxine XR  300 mg Oral QHS   Continuous Infusions: . 0.9 % NaCl with KCl 20 mEq / L 65 mL/hr at 10/07/13 0350   PRN Meds:.acetaminophen, acetaminophen, ALPRAZolam, alum & mag hydroxide-simeth, benzocaine, guaiFENesin-dextromethorphan, morphine injection, ondansetron (ZOFRAN) IV, ondansetron, oxyCODONE, polyvinyl alcohol    Blood pressure 116/82, pulse 50, temperature 98 F (36.7 C), temperature source Oral, resp. rate 18, height 5' 4"  (1.626 m),  weight 83.462 kg (184 lb), SpO2 98.00%.   Results for orders placed during the hospital encounter of 10/06/13 (from the past 48 hour(s))  CBC     Status: Abnormal   Collection Time    10/06/13  1:17 PM      Result Value Range   WBC 14.7 (*) 4.0 - 10.5 K/uL   RBC 4.79  3.87 - 5.11 MIL/uL   Hemoglobin 14.5  12.0 - 15.0 g/dL   HCT 44.3  36.0 - 46.0 %   MCV 92.5  78.0 - 100.0 fL   MCH 30.3  26.0 - 34.0 pg   MCHC 32.7  30.0 - 36.0 g/dL   RDW 14.3  11.5 - 15.5 %   Platelets 354  150 - 400 K/uL  BASIC METABOLIC PANEL     Status: Abnormal   Collection Time    10/06/13  1:17 PM      Result Value Range   Sodium 137  135 - 145 mEq/L   Potassium 4.0  3.5 - 5.1 mEq/L   Chloride 99  96 - 112 mEq/L   CO2 24  19 - 32 mEq/L   Glucose, Bld 107 (*) 70 - 99 mg/dL   BUN 19  6 - 23 mg/dL   Creatinine, Ser 0.85  0.50 - 1.10 mg/dL   Calcium 9.6  8.4 - 10.5 mg/dL   GFR calc non Af Amer 74 (*) >90 mL/min   GFR calc Af Amer 86 (*) >90 mL/min   Comment: (NOTE)     The eGFR has been calculated using the CKD EPI equation.     This calculation has not been validated in all clinical situations.     eGFR's persistently <90 mL/min signify possible Chronic Kidney     Disease.  PRO B NATRIURETIC PEPTIDE     Status: None   Collection Time    10/06/13  1:17 PM      Result Value Range   Pro B Natriuretic peptide (BNP) 40.9  0 - 125 pg/mL  LIPASE, BLOOD     Status: None   Collection Time    10/06/13  1:17 PM      Result Value Range   Lipase 29  11 - 59 U/L  POCT I-STAT TROPONIN I     Status: None  Collection Time    10/06/13  1:26 PM      Result Value Range   Troponin i, poc 0.00  0.00 - 0.08 ng/mL   Comment 3            Comment: Due to the release kinetics of cTnI,     a negative result within the first hours     of the onset of symptoms does not rule out     myocardial infarction with certainty.     If myocardial infarction is still suspected,     repeat the test at appropriate intervals.  POCT  I-STAT, CHEM 8     Status: Abnormal   Collection Time    10/06/13  1:28 PM      Result Value Range   Sodium 141  135 - 145 mEq/L   Potassium 3.7  3.5 - 5.1 mEq/L   Chloride 104  96 - 112 mEq/L   BUN 19  6 - 23 mg/dL   Creatinine, Ser 0.90  0.50 - 1.10 mg/dL   Glucose, Bld 102 (*) 70 - 99 mg/dL   Calcium, Ion 1.22  1.12 - 1.23 mmol/L   TCO2 23  0 - 100 mmol/L   Hemoglobin 16.7 (*) 12.0 - 15.0 g/dL   HCT 49.0 (*) 36.0 - 46.0 %  TROPONIN I     Status: None   Collection Time    10/06/13  5:01 PM      Result Value Range   Troponin I <0.30  <0.30 ng/mL   Comment:            Due to the release kinetics of cTnI,     a negative result within the first hours     of the onset of symptoms does not rule out     myocardial infarction with certainty.     If myocardial infarction is still suspected,     repeat the test at appropriate intervals.  BASIC METABOLIC PANEL     Status: Abnormal   Collection Time    10/07/13  6:02 AM      Result Value Range   Sodium 138  135 - 145 mEq/L   Potassium 4.3  3.5 - 5.1 mEq/L   Chloride 103  96 - 112 mEq/L   CO2 26  19 - 32 mEq/L   Glucose, Bld 85  70 - 99 mg/dL   BUN 15  6 - 23 mg/dL   Creatinine, Ser 0.91  0.50 - 1.10 mg/dL   Calcium 8.7  8.4 - 10.5 mg/dL   GFR calc non Af Amer 68 (*) >90 mL/min   GFR calc Af Amer 79 (*) >90 mL/min   Comment: (NOTE)     The eGFR has been calculated using the CKD EPI equation.     This calculation has not been validated in all clinical situations.     eGFR's persistently <90 mL/min signify possible Chronic Kidney     Disease.  CBC     Status: Abnormal   Collection Time    10/07/13  6:02 AM      Result Value Range   WBC 12.4 (*) 4.0 - 10.5 K/uL   RBC 4.19  3.87 - 5.11 MIL/uL   Hemoglobin 12.6  12.0 - 15.0 g/dL   Comment: SPECIMEN CHECKED FOR CLOTS     REPEATED TO VERIFY     DELTA CHECK NOTED   HCT 39.3  36.0 - 46.0 %   MCV 93.8  78.0 -  100.0 fL   MCH 30.1  26.0 - 34.0 pg   MCHC 32.1  30.0 - 36.0 g/dL    RDW 14.3  11.5 - 15.5 %   Platelets 304  150 - 400 K/uL  TROPONIN I     Status: None   Collection Time    10/07/13  6:02 AM      Result Value Range   Troponin I <0.30  <0.30 ng/mL   Comment:            Due to the release kinetics of cTnI,     a negative result within the first hours     of the onset of symptoms does not rule out     myocardial infarction with certainty.     If myocardial infarction is still suspected,     repeat the test at appropriate intervals.  HEPATIC FUNCTION PANEL     Status: Abnormal   Collection Time    10/07/13  6:02 AM      Result Value Range   Total Protein 6.9  6.0 - 8.3 g/dL   Albumin 3.3 (*) 3.5 - 5.2 g/dL   AST 15  0 - 37 U/L   ALT 25  0 - 35 U/L   Alkaline Phosphatase 94  39 - 117 U/L   Total Bilirubin 0.3  0.3 - 1.2 mg/dL   Bilirubin, Direct <0.1  0.0 - 0.3 mg/dL   Indirect Bilirubin NOT CALCULATED  0.3 - 0.9 mg/dL    Ct Angio Chest Aorta W/cm &/or Wo/cm  10/06/2013   CLINICAL DATA:  Chest and back pain.  EXAM: CT ANGIOGRAPHY CHEST, ABDOMEN AND PELVIS  TECHNIQUE: Multidetector CT imaging through the chest, abdomen and pelvis was performed using the standard protocol during bolus administration of intravenous contrast. Multiplanar reconstructed images including MIPs were obtained and reviewed to evaluate the vascular anatomy.  CONTRAST:  134m OMNIPAQUE IOHEXOL 350 MG/ML SOLN  COMPARISON:  CT scan of February 11, 2012.  FINDINGS: CTA CHEST FINDINGS  No pleural effusion or pneumothorax is noted. No abnormality is seen in the pulmonary parenchyma. No mediastinal mass or adenopathy is noted. The thoracic aorta appears normal without evidence of aneurysm or dissection. The proximal great vessels appear normal. The pulmonary artery and its branches appear normal. No definite osseous abnormality is noted in the chest.  Review of the MIP images confirms the above findings.  CTA ABDOMEN AND PELVIS FINDINGS  The abdominal aorta and its mesenteric and renal branches  appear widely patent. Mild atherosclerotic calcifications of abdominal aorta are noted without aneurysm or dissection. The visualized portions of the iliac arteries appear normal. Status post cholecystectomy. The liver, spleen and pancreas appear normal. Adrenal glands and kidneys appear normal. No evidence of bowel obstruction is noted. No abnormal fluid collection is noted. The appendix appears normal. Urinary bladder appears normal. No osseous abnormality is noted.  Review of the MIP images confirms the above findings.  IMPRESSION: No evidence of aneurysm or dissection seen involving the thoracic or abdominal aorta.   Electronically Signed   By: JSabino DickM.D.   On: 10/06/2013 15:16   Ct Angio Abd/pel W/ And/or W/o  10/06/2013   CLINICAL DATA:  Chest and back pain.  EXAM: CT ANGIOGRAPHY CHEST, ABDOMEN AND PELVIS  TECHNIQUE: Multidetector CT imaging through the chest, abdomen and pelvis was performed using the standard protocol during bolus administration of intravenous contrast. Multiplanar reconstructed images including MIPs were obtained and reviewed to evaluate the vascular anatomy.  CONTRAST:  132m OMNIPAQUE IOHEXOL 350 MG/ML SOLN  COMPARISON:  CT scan of February 11, 2012.  FINDINGS: CTA CHEST FINDINGS  No pleural effusion or pneumothorax is noted. No abnormality is seen in the pulmonary parenchyma. No mediastinal mass or adenopathy is noted. The thoracic aorta appears normal without evidence of aneurysm or dissection. The proximal great vessels appear normal. The pulmonary artery and its branches appear normal. No definite osseous abnormality is noted in the chest.  Review of the MIP images confirms the above findings.  CTA ABDOMEN AND PELVIS FINDINGS  The abdominal aorta and its mesenteric and renal branches appear widely patent. Mild atherosclerotic calcifications of abdominal aorta are noted without aneurysm or dissection. The visualized portions of the iliac arteries appear normal. Status post  cholecystectomy. The liver, spleen and pancreas appear normal. Adrenal glands and kidneys appear normal. No evidence of bowel obstruction is noted. No abnormal fluid collection is noted. The appendix appears normal. Urinary bladder appears normal. No osseous abnormality is noted.  Review of the MIP images confirms the above findings.  IMPRESSION: No evidence of aneurysm or dissection seen involving the thoracic or abdominal aorta.   Electronically Signed   By: JSabino DickM.D.   On: 10/06/2013 15:16        [place] A. DMerlene Laughter M.D.  Diplomate, ATax adviserof Psychiatry and Neurology ( Neurology). 10/07/2013, 8:52 AM

## 2013-10-08 ENCOUNTER — Encounter (HOSPITAL_COMMUNITY): Payer: Self-pay | Admitting: Internal Medicine

## 2013-10-08 DIAGNOSIS — K2970 Gastritis, unspecified, without bleeding: Secondary | ICD-10-CM | POA: Diagnosis present

## 2013-10-08 DIAGNOSIS — K224 Dyskinesia of esophagus: Secondary | ICD-10-CM

## 2013-10-08 HISTORY — DX: Gastritis, unspecified, without bleeding: K29.70

## 2013-10-08 MED ORDER — OXYCODONE-ACETAMINOPHEN 2.5-325 MG PO TABS: 1.0000 | ORAL_TABLET | ORAL | Status: DC | PRN

## 2013-10-08 NOTE — Discharge Summary (Signed)
Physician Discharge Summary  MILDERD MANOCCHIO YNW:[id number] DOB: [birth year] DOA: 10/06/2013  PCP: Lenard Simmer, MD  Admit date: 10/06/2013 Discharge date: 10/08/2013  Time spent: Greater than 30 minutes  Recommendations for Outpatient Follow-up:  1. Deferred medical management to the patient's primary gastroenterologist for possible esophageal spasms. 2. Biopsy results of the esophagus by Dr. Oneida Alar pending at the time of discharge. The patient will be informed of the results by Dr. Oneida Alar.  Discharge Diagnoses:   1. Substernal/precordial chest pain, likely secondary to GERD or possibly esophageal spasm. 2. Mild nonerosive gastritis, per EGD. 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 4. Asthmatic bronchitis. No exacerbation or active symptomatology during the hospitalization. 5. Chronic migraine headaches. 6. Transient bradycardia. 7. Fibromyalgia. 8. Depression with anxiety. 9. Mild leukocytosis, secondary to steroid therapy.  Discharge Condition: Improved.  Diet recommendation: Heart healthy.  Filed Weights   10/06/13 1242 10/06/13 1933 10/07/13 1425  Weight: 83.462 kg (184 lb) 83.462 kg (184 lb) 83.462 kg (184 lb)    History of present illness:   The patient is a 57 year old woman with a history significant for nutcracker esophagus, gastroesophageal reflux disease, spastic colon, migraine headaches, and fibromyalgia, who presented to the emergency department today with an episode of acute substernal/precordial chest pain that radiated to her mid upper back. Accordingly, the patient's primary care physician had referred her to Dr. Merlene Laughter for further evaluation and management of chronic migraine headaches. While she was sitting in Dr. Freddie Apley office today, she had a sudden onset of bilateral shoulder pain, which radiated to her mid back between her shoulder blades, which then radiated to her upper chest and substernal area. She became a little clammy. She had  shortness of breath briefly. She became slightly nauseated. She denied vomiting or abdominal pain. She described the pain as pressure and a burning sensation. Changing her position did not help. She also felt a sensation as if she had something stuck in her throat. She had no recent history of painful swallowing or difficulty swallowing. She denied abdominal pain. She denied any history of melena or bright red blood per rectum. She has chronic constipation for which she uses home enemas on her regular basis. Her last bowel movement was 2 days ago. She also has a history of GERD and was recently prescribed Dexilant for refractory heartburn. She also has a history of chronic anxiety, but she denied panic symptomatology. She takes ibuprofen on occasion for pain. She also takes as needed Treximet which is a combination of sumatriptan and and naproxen. Of note, she has almost completed a recent prednisone taper for asthmatic bronchitis.  In the emergency department, she is hemodynamically stable and afebrile. Her lab data were significant for a WBC of 14.7, hemoglobin of 16.7, normal lipase and normal troponin I. CT angiogram of her chest and abdomen revealed no acute findings, specifically no evidence of aneurysm or dissection. Her EKG revealed normal sinus rhythm with a heart rate of 80 beats per minute and nonspecific T-wave abnormalities. She was being admitted for further evaluation and management.   Hospital Course:  While she was evaluated in the ED, she received multiple medications including sublingual Procardia, sublingual nitroglycerin, GI cocktail, and IV morphine. These measures gave her little relief. Following admission, she was started on gentle IV fluid hydration, clear liquid diet, and every 12 hour IV Protonix. Antiemetics and opiate analgesics were also ordered. For further evaluation, a number of studies were ordered. The 2-D echocardiogram revealed grade 2 diastolic dysfunction which was  not  likely the source of her pain. Her troponin I was negative x3. Her TSH was within normal limits.  Gastroenterologist, Dr. Gala Romney and then Dr. Oneida Alar were consulted. Dr. Oneida Alar proceeded with an upper endoscopy. The results were dictated above. Biopsies were taken. Dr. Oneida Alar recommended continued treatment of her GERD. She believed that the patient's pain was either secondary to esophageal spasm or GERD. She recommended that the patient followup with her primary gastroenterologist, Dr Bary Leriche in [place] for further management.  The patient was also instructed to discontinue NSAIDs. She had taken ibuprofen periodically and had been recently on prednisone. She also takes Treximet (sumatriptan and and naproxen) for migraine headaches. These NSAIDs plus prednisone could have exacerbated her symptoms. She was instructed to discontinue all NSAIDs for a few weeks, but she was reluctant to discontinue Treximet. Dr. Merlene Laughter was consulted to assist with chronic migraine management. He recommended Tylenol or low-dose opiates. He also recommended for her to continue soma at bedtime as it helped with relieving her headaches. Therefore, she was given a prescription for Percocet for few more days. She will followup with Dr. Merlene Laughter as needed.  The patient chest pain subsided. She remained afebrile and hemodynamically stable.   Procedures: 1. EGD, by Dr. Oneida Alar, on 10/07/2013. Results revealed mild esophagitis, mild nonerosive gastritis, status post multiple biopsies of the esophagus.  2. 2-D echocardiogram:Study Conclusions  - Left ventricle: The cavity size was normal. There was mild focal basal hypertrophy of the septum. Systolic function was normal. The estimated ejection fraction was in the range of 60% to 65%. Wall motion was normal; there were no regional wall motion abnormalities. Features are consistent with a pseudonormal left ventricular filling pattern, with concomitant abnormal relaxation  and increased filling pressure (grade 2 diastolic dysfunction). - Aortic root: The aortic root was mildly dilated. - Mitral valve: Calcified annulus. Trivial regurgitation. - Tricuspid valve: Trivial regurgitation. - Pulmonary arteries: Systolic pressure could not be accurately estimated. - Inferior vena cava: Not visualized. Unable to estimate CVP. - Pericardium, extracardiac: There was no pericardial effusion. Impressions:  - No prior study for comparison. Mild basal septal hypertrophy with LVEF 76-72%, grade 2 diastolic dysfunction. Mildly dilated aortic root. Mild MAC with trivial mtiral regurgitation. Unable to assess PASP. No pericardial effusion. Transthoracic echocardiography. M-mode, complete 2D, spectral Doppler, and color Doppler. Height: Height: 162.6cm. Height: 64in. Weight: Weight: 83.5kg. Weight: 183.7lb. Body mass index: BMI: 31.6kg/m^2. Body surface area: BSA: 1.27m2. Blood pressure: 116/82. Patient status: Inpatient. Location: Bedside.    Consultations:  Gastroenterology (Dr. FOneida Alarand Dr. RGala Romney  Neurology (Dr. DMerlene Laughter.  Discharge Exam: Filed Vitals:   10/08/13 0500  BP: 113/82  Pulse: 74  Temp: 97.4 F (36.3 C)  Resp: 16    General: No acute distress. Cardiovascular: S1, S2, with no murmurs rubs or gallops. Respiratory: Clear to auscultation bilaterally.  Discharge Instructions  Discharge Orders   Future Orders Complete By Expires   Diet general  As directed    Discharge instructions  As directed    Comments:     Avoid taking NSAIDs such as aspirin, ibuprofen, naproxen, Motrin, Advil, Excedrin, Goody's powder, and BC powders which can worsen acid reflux. Call Dr. FOneida Alaroffice for followup of the biopsy results next week.   Increase activity slowly  As directed        Medication List    STOP taking these medications       cephALEXin 500 MG capsule  Commonly known as:  KEFLEX  ibuprofen 200 MG tablet  Commonly known as:   ADVIL,MOTRIN     predniSONE 10 MG tablet  Commonly known as:  STERAPRED UNI-PAK      TAKE these medications       acetaminophen 500 MG tablet  Commonly known as:  TYLENOL  Take 500 mg by mouth every 6 (six) hours as needed for fever.     ALPRAZolam 0.5 MG tablet  Commonly known as:  XANAX  Take 0.5 mg by mouth 4 (four) times daily as needed. FOR ANXIETY     amitriptyline 25 MG tablet  Commonly known as:  ELAVIL  Take 25 mg by mouth at bedtime.     bisacodyl 5 MG EC tablet  Generic drug:  bisacodyl  Take 10 mg by mouth daily as needed for moderate constipation.     carisoprodol 350 MG tablet  Commonly known as:  SOMA  Take 1 tablet by mouth daily.     CHILDRENS MULTI-VITAMINS PO  Take 2 tablets by mouth daily.     DEXILANT 60 MG capsule  Generic drug:  dexlansoprazole  Take 1 capsule by mouth daily.     eletriptan 40 MG tablet  Commonly known as:  RELPAX  One tablet by mouth at onset of headache. May repeat in 2 hours if headache persists or recurs. may repeat in 2 hours if necessary     fluticasone 50 MCG/ACT nasal spray  Commonly known as:  FLONASE  Place 2 sprays into the nose daily.     gabapentin 600 MG tablet  Commonly known as:  NEURONTIN  Take 600 mg by mouth at bedtime.     GAS-X 80 MG chewable tablet  Generic drug:  simethicone  Chew 80 mg by mouth every 6 (six) hours as needed. FOR RELIEF     levothyroxine 112 MCG tablet  Commonly known as:  SYNTHROID, LEVOTHROID  Take 112 mcg by mouth every morning.     magnesium citrate Soln  Take 1 Bottle by mouth once.     MINIVELLE 0.1 MG/24HR patch  Generic drug:  estradiol  1 patch 2 (two) times a week.     multivitamin with minerals Tabs tablet  Take 1 tablet by mouth every morning.     NULEV 0.125 MG Tbdp disintergrating tablet  Generic drug:  hyoscyamine  Place 0.125 mg under the tongue every 4 (four) hours as needed.     ORAJEL MOUTH-AID 20 % Gel  Generic drug:  benzocaine  Use as directed 1  application in the mouth or throat at bedtime.     OVER THE COUNTER MEDICATION  Place 1 application into both eyes at bedtime.     oxycodone-acetaminophen 2.5-325 MG per tablet  Commonly known as:  PERCOCET  Take 1 tablet by mouth every 4 (four) hours as needed for pain.     PHILLIPS COLON HEALTH PO  Take 1 tablet by mouth every morning.     SYSTANE 0.4-0.3 % Soln  Generic drug:  Polyethyl Glycol-Propyl Glycol  Apply 1 drop to eye 2 (two) times daily.     TREXIMET 85-500 MG per tablet  Generic drug:  SUMAtriptan-naproxen  Take 1 tablet by mouth daily as needed for migraine.     venlafaxine XR 150 MG 24 hr capsule  Commonly known as:  EFFEXOR-XR  Take 300 mg by mouth at bedtime.       Allergies  Allergen Reactions  . Aspirin   . Vioxx [Rofecoxib] Diarrhea  . Celebrex [Celecoxib] Hives  .  Metoclopramide Hcl Anxiety    BODY TREMORS       Follow-up Information   Call Barney Drain, MD. (Call for biopsy results: [phone])    Specialty:  Gastroenterology   Contact information:   [place] [address] [place] [address] [phone]        The results of significant diagnostics from this hospitalization (including imaging, microbiology, ancillary and laboratory) are listed below for reference.    Significant Diagnostic Studies: Ct Angio Chest Aorta W/cm &/or Wo/cm  10/06/2013   CLINICAL DATA:  Chest and back pain.  EXAM: CT ANGIOGRAPHY CHEST, ABDOMEN AND PELVIS  TECHNIQUE: Multidetector CT imaging through the chest, abdomen and pelvis was performed using the standard protocol during bolus administration of intravenous contrast. Multiplanar reconstructed images including MIPs were obtained and reviewed to evaluate the vascular anatomy.  CONTRAST:  162m OMNIPAQUE IOHEXOL 350 MG/ML SOLN  COMPARISON:  CT scan of February 11, 2012.  FINDINGS: CTA CHEST FINDINGS  No pleural effusion or pneumothorax is noted. No abnormality is seen in the pulmonary  parenchyma. No mediastinal mass or adenopathy is noted. The thoracic aorta appears normal without evidence of aneurysm or dissection. The proximal great vessels appear normal. The pulmonary artery and its branches appear normal. No definite osseous abnormality is noted in the chest.  Review of the MIP images confirms the above findings.  CTA ABDOMEN AND PELVIS FINDINGS  The abdominal aorta and its mesenteric and renal branches appear widely patent. Mild atherosclerotic calcifications of abdominal aorta are noted without aneurysm or dissection. The visualized portions of the iliac arteries appear normal. Status post cholecystectomy. The liver, spleen and pancreas appear normal. Adrenal glands and kidneys appear normal. No evidence of bowel obstruction is noted. No abnormal fluid collection is noted. The appendix appears normal. Urinary bladder appears normal. No osseous abnormality is noted.  Review of the MIP images confirms the above findings.  IMPRESSION: No evidence of aneurysm or dissection seen involving the thoracic or abdominal aorta.   Electronically Signed   By: JSabino DickM.D.   On: 10/06/2013 15:16   Ct Angio Abd/pel W/ And/or W/o  10/06/2013   CLINICAL DATA:  Chest and back pain.  EXAM: CT ANGIOGRAPHY CHEST, ABDOMEN AND PELVIS  TECHNIQUE: Multidetector CT imaging through the chest, abdomen and pelvis was performed using the standard protocol during bolus administration of intravenous contrast. Multiplanar reconstructed images including MIPs were obtained and reviewed to evaluate the vascular anatomy.  CONTRAST:  1066mOMNIPAQUE IOHEXOL 350 MG/ML SOLN  COMPARISON:  CT scan of February 11, 2012.  FINDINGS: CTA CHEST FINDINGS  No pleural effusion or pneumothorax is noted. No abnormality is seen in the pulmonary parenchyma. No mediastinal mass or adenopathy is noted. The thoracic aorta appears normal without evidence of aneurysm or dissection. The proximal great vessels appear normal. The pulmonary artery  and its branches appear normal. No definite osseous abnormality is noted in the chest.  Review of the MIP images confirms the above findings.  CTA ABDOMEN AND PELVIS FINDINGS  The abdominal aorta and its mesenteric and renal branches appear widely patent. Mild atherosclerotic calcifications of abdominal aorta are noted without aneurysm or dissection. The visualized portions of the iliac arteries appear normal. Status post cholecystectomy. The liver, spleen and pancreas appear normal. Adrenal glands and kidneys appear normal. No evidence of bowel obstruction is noted. No abnormal fluid collection is noted. The appendix appears normal. Urinary bladder appears normal. No osseous abnormality is noted.  Review  of the MIP images confirms the above findings.  IMPRESSION: No evidence of aneurysm or dissection seen involving the thoracic or abdominal aorta.   Electronically Signed   By: Sabino Dick M.D.   On: 10/06/2013 15:16    Microbiology: No results found for this or any previous visit (from the past 240 hour(s)).   Labs: Basic Metabolic Panel:  Recent Labs Lab 10/06/13 1317 10/06/13 1328 10/07/13 0602  NA 137 141 138  K 4.0 3.7 4.3  CL 99 104 103  CO2 24  --  26  GLUCOSE 107* 102* 85  BUN 19 19 15   CREATININE 0.85 0.90 0.91  CALCIUM 9.6  --  8.7   Liver Function Tests:  Recent Labs Lab 10/07/13 0602  AST 15  ALT 25  ALKPHOS 94  BILITOT 0.3  PROT 6.9  ALBUMIN 3.3*    Recent Labs Lab 10/06/13 1317  LIPASE 29   No results found for this basename: AMMONIA,  in the last 168 hours CBC:  Recent Labs Lab 10/06/13 1317 10/06/13 1328 10/07/13 0602  WBC 14.7*  --  12.4*  HGB 14.5 16.7* 12.6  HCT 44.3 49.0* 39.3  MCV 92.5  --  93.8  PLT 354  --  304   Cardiac Enzymes:  Recent Labs Lab 10/06/13 1701 10/07/13 0602  TROPONINI <0.30 <0.30   BNP: BNP (last 3 results)  Recent Labs  10/06/13 1317  PROBNP 40.9   CBG: No results found for this basename: GLUCAP,  in the  last 168 hours     Signed:  Annsleigh Dragoo  [facility] 10/08/2013, 7:30 PM

## 2013-10-08 NOTE — Progress Notes (Signed)
D/c instructions reviewed with patient.  Verbalized understanding.  Pt dc'd to home with husband. Schonewitz, Eulis Canner 10/08/2013

## 2013-10-11 ENCOUNTER — Encounter (HOSPITAL_COMMUNITY): Payer: Self-pay | Admitting: Gastroenterology

## 2013-10-14 ENCOUNTER — Other Ambulatory Visit: Payer: Self-pay

## 2013-10-14 ENCOUNTER — Inpatient Hospital Stay (HOSPITAL_COMMUNITY)
Admission: EM | Admit: 2013-10-14 | Discharge: 2013-10-16 | DRG: 202 | Disposition: A | Discharge status: Home / Self Care | Payer: Medicare Other | Attending: Internal Medicine | Admitting: Internal Medicine

## 2013-10-14 ENCOUNTER — Emergency Department (HOSPITAL_COMMUNITY): Payer: Medicare Other

## 2013-10-14 ENCOUNTER — Encounter (HOSPITAL_COMMUNITY): Payer: Self-pay | Admitting: Emergency Medicine

## 2013-10-14 DIAGNOSIS — F418 Other specified anxiety disorders: Secondary | ICD-10-CM

## 2013-10-14 DIAGNOSIS — R072 Precordial pain: Secondary | ICD-10-CM

## 2013-10-14 DIAGNOSIS — J9600 Acute respiratory failure, unspecified whether with hypoxia or hypercapnia: Secondary | ICD-10-CM

## 2013-10-14 DIAGNOSIS — Z85850 Personal history of malignant neoplasm of thyroid: Secondary | ICD-10-CM

## 2013-10-14 DIAGNOSIS — E0789 Other specified disorders of thyroid: Secondary | ICD-10-CM | POA: Diagnosis present

## 2013-10-14 DIAGNOSIS — K900 Celiac disease: Secondary | ICD-10-CM | POA: Diagnosis present

## 2013-10-14 DIAGNOSIS — F411 Generalized anxiety disorder: Secondary | ICD-10-CM | POA: Diagnosis present

## 2013-10-14 DIAGNOSIS — M129 Arthropathy, unspecified: Secondary | ICD-10-CM | POA: Diagnosis present

## 2013-10-14 DIAGNOSIS — E785 Hyperlipidemia, unspecified: Secondary | ICD-10-CM | POA: Diagnosis present

## 2013-10-14 DIAGNOSIS — F341 Dysthymic disorder: Secondary | ICD-10-CM

## 2013-10-14 DIAGNOSIS — F3289 Other specified depressive episodes: Secondary | ICD-10-CM | POA: Diagnosis present

## 2013-10-14 DIAGNOSIS — K589 Irritable bowel syndrome without diarrhea: Secondary | ICD-10-CM | POA: Diagnosis present

## 2013-10-14 DIAGNOSIS — R001 Bradycardia, unspecified: Secondary | ICD-10-CM

## 2013-10-14 DIAGNOSIS — K2970 Gastritis, unspecified, without bleeding: Secondary | ICD-10-CM

## 2013-10-14 DIAGNOSIS — F329 Major depressive disorder, single episode, unspecified: Secondary | ICD-10-CM | POA: Diagnosis present

## 2013-10-14 DIAGNOSIS — M797 Fibromyalgia: Secondary | ICD-10-CM

## 2013-10-14 DIAGNOSIS — Z801 Family history of malignant neoplasm of trachea, bronchus and lung: Secondary | ICD-10-CM

## 2013-10-14 DIAGNOSIS — IMO0002 Reserved for concepts with insufficient information to code with codable children: Secondary | ICD-10-CM

## 2013-10-14 DIAGNOSIS — G43709 Chronic migraine without aura, not intractable, without status migrainosus: Secondary | ICD-10-CM | POA: Diagnosis present

## 2013-10-14 DIAGNOSIS — K5900 Constipation, unspecified: Secondary | ICD-10-CM | POA: Diagnosis present

## 2013-10-14 DIAGNOSIS — J45909 Unspecified asthma, uncomplicated: Secondary | ICD-10-CM

## 2013-10-14 DIAGNOSIS — K219 Gastro-esophageal reflux disease without esophagitis: Secondary | ICD-10-CM | POA: Diagnosis present

## 2013-10-14 DIAGNOSIS — F419 Anxiety disorder, unspecified: Secondary | ICD-10-CM | POA: Diagnosis present

## 2013-10-14 DIAGNOSIS — K224 Dyskinesia of esophagus: Secondary | ICD-10-CM

## 2013-10-14 DIAGNOSIS — J45901 Unspecified asthma with (acute) exacerbation: Principal | ICD-10-CM

## 2013-10-14 DIAGNOSIS — IMO0001 Reserved for inherently not codable concepts without codable children: Secondary | ICD-10-CM | POA: Diagnosis present

## 2013-10-14 DIAGNOSIS — M3500 Sicca syndrome, unspecified: Secondary | ICD-10-CM | POA: Diagnosis present

## 2013-10-14 DIAGNOSIS — E039 Hypothyroidism, unspecified: Secondary | ICD-10-CM

## 2013-10-14 DIAGNOSIS — Z9089 Acquired absence of other organs: Secondary | ICD-10-CM

## 2013-10-14 DIAGNOSIS — Z803 Family history of malignant neoplasm of breast: Secondary | ICD-10-CM

## 2013-10-14 HISTORY — DX: Major depressive disorder, single episode, unspecified: F32.9

## 2013-10-14 HISTORY — DX: Depression, unspecified: F32.A

## 2013-10-14 LAB — CBC WITH DIFFERENTIAL/PLATELET
Basophils Absolute: 0.1 10*3/uL (ref 0.0–0.1)
Basophils Relative: 1 % (ref 0–1)
HCT: 40.3 % (ref 36.0–46.0)
Hemoglobin: 13.2 g/dL (ref 12.0–15.0)
Lymphocytes Relative: 21 % (ref 12–46)
Lymphs Abs: 2.4 10*3/uL (ref 0.7–4.0)
MCV: 92.4 fL (ref 78.0–100.0)
Monocytes Absolute: 0.9 10*3/uL (ref 0.1–1.0)
Neutro Abs: 7.2 10*3/uL (ref 1.7–7.7)
RBC: 4.36 MIL/uL (ref 3.87–5.11)
RDW: 14.3 % (ref 11.5–15.5)
WBC: 11.1 10*3/uL — ABNORMAL HIGH (ref 4.0–10.5)

## 2013-10-14 LAB — POCT I-STAT, CHEM 8
BUN: 19 mg/dL (ref 6–23)
Glucose, Bld: 112 mg/dL — ABNORMAL HIGH (ref 70–99)
Hemoglobin: 13.6 g/dL (ref 12.0–15.0)
Sodium: 142 mEq/L (ref 135–145)
TCO2: 23 mmol/L (ref 0–100)

## 2013-10-14 LAB — PRO B NATRIURETIC PEPTIDE: Pro B Natriuretic peptide (BNP): 30.4 pg/mL (ref 0–125)

## 2013-10-14 LAB — TROPONIN I: Troponin I: <0.3 ng/mL (ref <0.30)

## 2013-10-14 MED ORDER — SODIUM CHLORIDE 0.9 % IJ SOLN
3.0000 mL | Freq: Two times a day (BID) | INTRAMUSCULAR | Status: DC
  Administered 2013-10-14 – 2013-10-15 (×2): 3 mL via INTRAVENOUS

## 2013-10-14 MED ORDER — METHYLPREDNISOLONE SODIUM SUCC 125 MG IJ SOLR
60.0000 mg | Freq: Four times a day (QID) | INTRAMUSCULAR | Status: DC
  Administered 2013-10-14 – 2013-10-15 (×3): 60 mg via INTRAVENOUS
  Filled 2013-10-14 (×3): qty 2

## 2013-10-14 MED ORDER — ALUM & MAG HYDROXIDE-SIMETH 200-200-20 MG/5ML PO SUSP: 30.0000 mL | Freq: Four times a day (QID) | ORAL | Status: DC | PRN

## 2013-10-14 MED ORDER — ENOXAPARIN SODIUM 40 MG/0.4ML ~~LOC~~ SOLN
40.0000 mg | SUBCUTANEOUS | Status: DC
  Administered 2013-10-14 – 2013-10-15 (×2): 40 mg via SUBCUTANEOUS
  Filled 2013-10-14 (×2): qty 0.4

## 2013-10-14 MED ORDER — IPRATROPIUM BROMIDE 0.02 % IN SOLN
0.5000 mg | Freq: Once | RESPIRATORY_TRACT | Status: AC
  Administered 2013-10-14: 0.5 mg via RESPIRATORY_TRACT
  Filled 2013-10-14: qty 2.5

## 2013-10-14 MED ORDER — PROMETHAZINE HCL 25 MG/ML IJ SOLN
25.0000 mg | Freq: Once | INTRAMUSCULAR | Status: AC
  Administered 2013-10-14: 25 mg via INTRAVENOUS
  Filled 2013-10-14: qty 1

## 2013-10-14 MED ORDER — LEVOFLOXACIN IN D5W 500 MG/100ML IV SOLN
500.0000 mg | INTRAVENOUS | Status: DC
  Administered 2013-10-14: 500 mg via INTRAVENOUS
  Filled 2013-10-14 (×3): qty 100

## 2013-10-14 MED ORDER — ALBUTEROL (5 MG/ML) CONTINUOUS INHALATION SOLN
10.0000 mg/h | INHALATION_SOLUTION | Freq: Once | RESPIRATORY_TRACT | Status: AC
  Administered 2013-10-14: 10 mg/h via RESPIRATORY_TRACT
  Filled 2013-10-14: qty 20

## 2013-10-14 MED ORDER — HYOSCYAMINE SULFATE 0.125 MG SL SUBL: 0.1250 mg | SUBLINGUAL_TABLET | SUBLINGUAL | Status: DC | PRN

## 2013-10-14 MED ORDER — ONDANSETRON HCL 4 MG/2ML IJ SOLN: 4.0000 mg | Freq: Four times a day (QID) | INTRAMUSCULAR | Status: DC | PRN

## 2013-10-14 MED ORDER — ACETAMINOPHEN 325 MG PO TABS: 650.0000 mg | ORAL_TABLET | Freq: Four times a day (QID) | ORAL | Status: DC | PRN

## 2013-10-14 MED ORDER — ONDANSETRON HCL 4 MG PO TABS: 4.0000 mg | ORAL_TABLET | Freq: Four times a day (QID) | ORAL | Status: DC | PRN

## 2013-10-14 MED ORDER — SUMATRIPTAN SUCCINATE 50 MG PO TABS
75.0000 mg | ORAL_TABLET | ORAL | Status: DC | PRN
  Administered 2013-10-14: 75 mg via ORAL
  Filled 2013-10-14: qty 1

## 2013-10-14 MED ORDER — HYDROCOD POLST-CHLORPHEN POLST 10-8 MG/5ML PO LQCR
5.0000 mL | Freq: Two times a day (BID) | ORAL | Status: DC
  Administered 2013-10-14 – 2013-10-15 (×3): 5 mL via ORAL
  Filled 2013-10-14 (×3): qty 5

## 2013-10-14 MED ORDER — SUMATRIPTAN-NAPROXEN SODIUM 85-500 MG PO TABS: 1.0000 | ORAL_TABLET | Freq: Every day | ORAL | Status: DC | PRN

## 2013-10-14 MED ORDER — NAPROXEN 250 MG PO TABS
500.0000 mg | ORAL_TABLET | ORAL | Status: DC | PRN
  Administered 2013-10-14: 500 mg via ORAL
  Filled 2013-10-14: qty 2

## 2013-10-14 MED ORDER — ALBUTEROL SULFATE (5 MG/ML) 0.5% IN NEBU
2.5000 mg | INHALATION_SOLUTION | RESPIRATORY_TRACT | Status: DC
  Administered 2013-10-14 – 2013-10-15 (×7): 2.5 mg via RESPIRATORY_TRACT
  Filled 2013-10-14 (×8): qty 0.5

## 2013-10-14 MED ORDER — GABAPENTIN 300 MG PO CAPS
600.0000 mg | ORAL_CAPSULE | Freq: Every day | ORAL | Status: DC
  Administered 2013-10-14 – 2013-10-15 (×2): 600 mg via ORAL
  Filled 2013-10-14 (×2): qty 2

## 2013-10-14 MED ORDER — OXYCODONE HCL 5 MG PO TABS: 5.0000 mg | ORAL_TABLET | ORAL | Status: DC | PRN

## 2013-10-14 MED ORDER — AMITRIPTYLINE HCL 25 MG PO TABS
25.0000 mg | ORAL_TABLET | Freq: Every day | ORAL | Status: DC
  Administered 2013-10-14 – 2013-10-15 (×2): 25 mg via ORAL
  Filled 2013-10-14 (×2): qty 1

## 2013-10-14 MED ORDER — SODIUM CHLORIDE 0.9 % IV SOLN
1000.0000 mL | Freq: Once | INTRAVENOUS | Status: AC
  Administered 2013-10-14: 1000 mL via INTRAVENOUS

## 2013-10-14 MED ORDER — IPRATROPIUM BROMIDE 0.02 % IN SOLN
0.5000 mg | RESPIRATORY_TRACT | Status: DC
  Administered 2013-10-14 – 2013-10-15 (×7): 0.5 mg via RESPIRATORY_TRACT
  Filled 2013-10-14 (×8): qty 2.5

## 2013-10-14 MED ORDER — ALPRAZOLAM 0.5 MG PO TABS
0.5000 mg | ORAL_TABLET | Freq: Four times a day (QID) | ORAL | Status: DC | PRN
  Administered 2013-10-14 – 2013-10-15 (×2): 0.5 mg via ORAL
  Filled 2013-10-14 (×2): qty 1

## 2013-10-14 MED ORDER — MORPHINE SULFATE 4 MG/ML IJ SOLN
4.0000 mg | Freq: Once | INTRAMUSCULAR | Status: AC
  Administered 2013-10-14: 4 mg via INTRAVENOUS
  Filled 2013-10-14: qty 1

## 2013-10-14 MED ORDER — SODIUM CHLORIDE 0.9 % IV SOLN
INTRAVENOUS | Status: AC
  Administered 2013-10-14: 15:00:00 via INTRAVENOUS

## 2013-10-14 MED ORDER — CARISOPRODOL 350 MG PO TABS
350.0000 mg | ORAL_TABLET | Freq: Every day | ORAL | Status: DC
  Administered 2013-10-14 – 2013-10-15 (×3): 350 mg via ORAL
  Filled 2013-10-14 (×3): qty 1

## 2013-10-14 MED ORDER — BISACODYL 5 MG PO TBEC
10.0000 mg | DELAYED_RELEASE_TABLET | Freq: Every day | ORAL | Status: DC | PRN
  Administered 2013-10-14: 10 mg via ORAL
  Filled 2013-10-14: qty 2

## 2013-10-14 MED ORDER — DIPHENHYDRAMINE HCL 50 MG/ML IJ SOLN
25.0000 mg | Freq: Once | INTRAMUSCULAR | Status: AC
  Administered 2013-10-14: 25 mg via INTRAVENOUS
  Filled 2013-10-14: qty 1

## 2013-10-14 MED ORDER — PREDNISONE 50 MG PO TABS
60.0000 mg | ORAL_TABLET | Freq: Once | ORAL | Status: AC
  Administered 2013-10-14: 60 mg via ORAL
  Filled 2013-10-14 (×2): qty 1

## 2013-10-14 MED ORDER — ALBUTEROL SULFATE (5 MG/ML) 0.5% IN NEBU: 2.5000 mg | INHALATION_SOLUTION | RESPIRATORY_TRACT | Status: DC | PRN

## 2013-10-14 MED ORDER — ALBUTEROL SULFATE (5 MG/ML) 0.5% IN NEBU
2.5000 mg | INHALATION_SOLUTION | Freq: Once | RESPIRATORY_TRACT | Status: AC
  Administered 2013-10-14: 2.5 mg via RESPIRATORY_TRACT
  Filled 2013-10-14: qty 0.5

## 2013-10-14 MED ORDER — PANTOPRAZOLE SODIUM 40 MG PO TBEC
40.0000 mg | DELAYED_RELEASE_TABLET | Freq: Every day | ORAL | Status: DC
  Administered 2013-10-14 – 2013-10-15 (×2): 40 mg via ORAL
  Filled 2013-10-14 (×2): qty 1

## 2013-10-14 MED ORDER — GUAIFENESIN-DM 100-10 MG/5ML PO SYRP
5.0000 mL | ORAL_SOLUTION | ORAL | Status: DC | PRN
  Administered 2013-10-14: 5 mL via ORAL
  Filled 2013-10-14: qty 5

## 2013-10-14 MED ORDER — ACETAMINOPHEN 650 MG RE SUPP: 650.0000 mg | Freq: Four times a day (QID) | RECTAL | Status: DC | PRN

## 2013-10-14 MED ORDER — LEVOTHYROXINE SODIUM 112 MCG PO TABS
112.0000 ug | ORAL_TABLET | Freq: Every morning | ORAL | Status: DC
  Administered 2013-10-14 – 2013-10-15 (×2): 112 ug via ORAL
  Filled 2013-10-14 (×4): qty 1

## 2013-10-14 MED ORDER — METHYLPREDNISOLONE SODIUM SUCC 125 MG IJ SOLR
125.0000 mg | Freq: Once | INTRAMUSCULAR | Status: AC
  Administered 2013-10-14: 125 mg via INTRAVENOUS
  Filled 2013-10-14: qty 2

## 2013-10-14 MED ORDER — POLYETHYLENE GLYCOL 3350 17 G PO PACK: 17.0000 g | PACK | Freq: Every day | ORAL | Status: DC

## 2013-10-14 MED ORDER — VENLAFAXINE HCL ER 75 MG PO CP24
300.0000 mg | ORAL_CAPSULE | Freq: Every day | ORAL | Status: DC
  Administered 2013-10-14 – 2013-10-15 (×2): 300 mg via ORAL
  Filled 2013-10-14 (×2): qty 4

## 2013-10-14 MED ORDER — ONDANSETRON HCL 4 MG/2ML IJ SOLN
4.0000 mg | Freq: Once | INTRAMUSCULAR | Status: AC
  Administered 2013-10-14: 4 mg via INTRAMUSCULAR
  Filled 2013-10-14: qty 2

## 2013-10-14 NOTE — ED Provider Notes (Signed)
CSN: [id number]     Arrival date & time 10/14/13  7628 History   First MD Initiated Contact with Patient 10/14/13 0845     Chief Complaint  Patient presents with  . Asthma   (Consider location/radiation/quality/duration/timing/severity/associated sxs/prior Treatment) Patient is a 57 y.o. female presenting with asthma. The history is provided by the patient. No language interpreter was used.  Asthma This is a recurrent problem. The current episode started 1 to 4 weeks ago. Associated symptoms include coughing, fatigue and nausea. Pertinent negatives include no chills, fever, vomiting or weakness.  Level V caveat. Continuous nebs and lengthy bedside time.  Patient is a 57 year old female who has been having difficulty with her asthma on and off for the last 3 weeks. She reports that she often gets bronchitis this time of the year. She denies fever or chills, vomiting or diarrhea. She reports that she has had a nonproductive cough for a few days. She reports mild nausea this morning and the beginning of a headache which she feels may be a migraine. She reports that she came in by EMS and had a breathing treatment on the way to the ER. She reports that she feels some better but is still wheezing. She denies chest pain or abdominal pain. No known sick exposure or recent travel. Past Medical History  Diagnosis Date  . Celiac sprue   . Hypothyroidism (acquired)   . Fibromyalgia   . Arthritis   . Hyperlipemia   . Migraine   . Thyroid cancer   . Pancreatitis   . GERD (gastroesophageal reflux disease)   . Sjogren's disease   . Chronic anxiety   . Asthmatic bronchitis   . 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 Spastic colon   . Hyperlipidemia     Intolerant to statins  . Gastritis 10/08/2013   Past Surgical History  Procedure Laterality Date  . Cholecystectomy    . Abdominal hysterectomy    . Thyroid lobectomy    . Bladder repair    . Nasal sinus surgery    . Esophagogastroduodenoscopy N/A  10/07/2013    Procedure: ESOPHAGOGASTRODUODENOSCOPY (EGD);  Surgeon: Danie Binder, MD;  Location: [facility];  Service: Endoscopy;  Laterality: N/A;  patient received heparin at 530am given phenergan 30m IV 30 minutes before   Family History  Problem Relation Age of Onset  . Pancreatitis Neg Hx   . Arthritis/Rheumatoid Mother     died in age 3945s . Colon cancer Neg Hx   . Breast cancer Maternal Grandmother   . Breast cancer Mother   . Breast cancer Maternal Aunt     X 2  . Lung cancer Maternal Aunt    History  Substance Use Topics  . Smoking status: Never Smoker   . Smokeless tobacco: Not on file  . Alcohol Use: No   OB History   Grav Para Term Preterm Abortions TAB SAB Ect Mult Living                 Review of Systems  Constitutional: Positive for fatigue. Negative for fever and chills.  Respiratory: Positive for cough and wheezing.   Gastrointestinal: Positive for nausea. Negative for vomiting.  Neurological: Negative for weakness.  All other systems reviewed and are negative.    Allergies  Aspirin; Vioxx; Celebrex; and Metoclopramide hcl  Home Medications   Current Outpatient Rx  Name  Route  Sig  Dispense  Refill  . acetaminophen (TYLENOL) 500 MG tablet   Oral  Take 500 mg by mouth every 6 (six) hours as needed for fever.         . ALPRAZolam (XANAX) 0.5 MG tablet   Oral   Take 0.5 mg by mouth 4 (four) times daily as needed. FOR ANXIETY         . amitriptyline (ELAVIL) 25 MG tablet   Oral   Take 25 mg by mouth at bedtime.         . benzocaine (ORAJEL MOUTH-AID) 20 % GEL   Mouth/Throat   Use as directed 1 application in the mouth or throat at bedtime.          . bisacodyl (BISACODYL) 5 MG EC tablet   Oral   Take 10 mg by mouth daily as needed for moderate constipation.         . carisoprodol (SOMA) 350 MG tablet   Oral   Take 1 tablet by mouth daily.         Marland Kitchen DEXILANT 60 MG capsule   Oral   Take 1 capsule by mouth daily.          Marland Kitchen eletriptan (RELPAX) 40 MG tablet   Oral   One tablet by mouth at onset of headache. May repeat in 2 hours if headache persists or recurs. may repeat in 2 hours if necessary         . fluticasone (FLONASE) 50 MCG/ACT nasal spray   Nasal   Place 2 sprays into the nose daily.         Marland Kitchen gabapentin (NEURONTIN) 600 MG tablet   Oral   Take 600 mg by mouth at bedtime.          . hyoscyamine (NULEV) 0.125 MG TBDP   Sublingual   Place 0.125 mg under the tongue every 4 (four) hours as needed.         Marland Kitchen levothyroxine (SYNTHROID, LEVOTHROID) 112 MCG tablet   Oral   Take 112 mcg by mouth every morning.          . magnesium citrate SOLN   Oral   Take 1 Bottle by mouth once.         Marland Kitchen MINIVELLE 0.1 MG/24HR patch      1 patch 2 (two) times a week.          . Multiple Vitamin (MULITIVITAMIN WITH MINERALS) TABS   Oral   Take 1 tablet by mouth every morning.          Marland Kitchen OVER THE COUNTER MEDICATION   Both Eyes   Place 1 application into both eyes at bedtime.         Marland Kitchen oxycodone-acetaminophen (PERCOCET) 2.5-325 MG per tablet   Oral   Take 1 tablet by mouth every 4 (four) hours as needed for pain.   15 tablet   0   . Pediatric Multiple Vitamins (CHILDRENS MULTI-VITAMINS PO)   Oral   Take 2 tablets by mouth daily.         Vladimir Faster Glycol-Propyl Glycol (SYSTANE) 0.4-0.3 % SOLN   Ophthalmic   Apply 1 drop to eye 2 (two) times daily.          . Probiotic Product (PHILLIPS COLON HEALTH PO)   Oral   Take 1 tablet by mouth every morning.          . simethicone (GAS-X) 80 MG chewable tablet   Oral   Chew 80 mg by mouth every 6 (six) hours as needed. FOR RELIEF         .  SUMAtriptan-naproxen (TREXIMET) 85-500 MG per tablet   Oral   Take 1 tablet by mouth daily as needed for migraine.         . venlafaxine XR (EFFEXOR-XR) 150 MG 24 hr capsule   Oral   Take 300 mg by mouth at bedtime.          BP 141/100  Pulse 85  Temp(Src) 98 F (36.7  C) (Oral)  Ht 5' 4"  (1.626 m)  Wt 172 lb (78.019 kg)  BMI 29.51 kg/m2  SpO2 98% Physical Exam  Nursing note and vitals reviewed. Constitutional: She is oriented to person, place, and time. She appears well-developed and well-nourished. No distress.  HENT:  Head: Normocephalic and atraumatic.  Right Ear: External ear normal.  Left Ear: External ear normal.  Mouth/Throat: Uvula is midline. Posterior oropharyngeal edema and posterior oropharyngeal erythema present.  Tonsils, grade II. No visible exudates.  Eyes: Conjunctivae and EOM are normal. Left eye exhibits no discharge.  Neck: Normal range of motion. Neck supple. No JVD present. No tracheal deviation present. No thyromegaly present.  Cardiovascular: Normal rate, regular rhythm, normal heart sounds and intact distal pulses.   Pulmonary/Chest: Effort normal. No accessory muscle usage. Not tachypneic and not bradypneic. She has decreased breath sounds in the right lower field and the left lower field. She has wheezes in the right upper field, the right middle field, the left upper field and the left middle field. She has no rhonchi. She has no rales.  Abdominal: Soft. Bowel sounds are normal. She exhibits no distension. There is no tenderness.  Musculoskeletal: Normal range of motion.  Lymphadenopathy:    She has no cervical adenopathy.  Neurological: She is alert and oriented to person, place, and time. She has normal strength. No cranial nerve deficit or sensory deficit. GCS eye subscore is 4. GCS verbal subscore is 5. GCS motor subscore is 6.  Skin: Skin is warm and dry.  Psychiatric: She has a normal mood and affect. Her behavior is normal. Judgment and thought content normal.    ED Course  Procedures (including critical care time) Labs Review Labs Reviewed - No data to display Imaging Review No results found.  EKG Interpretation   None       MDM   1. Asthma exacerbation   2. Acute asthma exacerbation   3. Acute  respiratory failure   4. Chronic migraine   5. Depression with anxiety   6. Chronic anxiety   7. Fibromyalgia   8. GERD (gastroesophageal reflux disease)   9. Hypothyroidism (acquired)   10. Esophageal spasm   11. Gastritis   12. Bradycardia   13. Substernal precordial chest pain   14. Asthmatic bronchitis   15. Nutcracker esophagus     Acute asthma exacerbation requiring continuous nebs with little improvement. Afebrile. Speaking in full sentences but continued wheezing. [facility]. Pt understands plan and agrees.        Elisha Headland, NP 10/22/13 2204

## 2013-10-14 NOTE — Progress Notes (Signed)
1020 Patient refused Albuterol CAT at this time due to severe headache. RN currently with another patient,but she was notified .

## 2013-10-14 NOTE — ED Notes (Signed)
Admitting MD is at the bedside as well as family.

## 2013-10-14 NOTE — ED Provider Notes (Signed)
Date: 10/14/2013  Rate: 87  Rhythm: normal sinus rhythm  QRS Axis: normal  Intervals: normal  ST/T Wave abnormalities: t wave abnormality in anterior leads  Conduction Disutrbances:none  Narrative Interpretation:   Old EKG Reviewed: unchanged from 10/06/13    Sharyon Cable, MD 10/14/13 [phone]

## 2013-10-14 NOTE — ED Provider Notes (Signed)
Patient seen/examined in the Emergency Department in conjunction with Midlevel Provider Ballinger Patient reports cough/wheezing Exam : wheezing noted bilaterally Plan: continue treatments, workup is in process   Sharyon Cable, MD 10/14/13 1009

## 2013-10-14 NOTE — H&P (Signed)
@LOGO @  [facility] History and Physical  Susan [name] YDX:[id number] DOB: January 21, 1956 DOA: 10/14/2013   PCP: Lenard Simmer, MD  Specialists: She is followed by a psychiatrist, Dr. Martie Round, followed by a neurologist, Dr. Merlene Laughter for chronic migraine headaches, followed by gastroenterology, both in [facility] and [facility]. She's also followed by a dermatologist a gynecologist and a podiatrist.  Chief Complaint: Shortness of breath and wheezing for the last 2 days  HPI: Susan [name] is a 57 y.o. female with a past medical history of Sjogren's disease, celiac disease, GERD, Nutcracker esophagus, who was recently hospitalized for chest pain, which was thought to be secondary to GI etiology. She underwent an EGD, which did not show any esophageal lesions. However, did show gastritis. It was felt that her symptoms were due to esophageal spasms. Patient was also recovering from asthmatic bronchitis at that time. In the last few days patient has had wheezing and cough. This has gotten progressively worse. She's had difficulty sleeping as a result of her cough. She denies any expectoration. No blood in the sputum. Has been feeling lightheaded and dizzy as a result of her coughing spells. Denies any new chest pains apart from the one that she has been having since 2 weeks ago which is mainly with eating. No fever. No chills. She had a grand niece, who had bronchitis symptoms recently. She's been having migraine headaches, which are chronic for her. She denies any falls. Has been having nausea with dry heaves. Since her symptoms were not improving she decided to come in to the hospital. She tried taking her inhalers 3 times a day without any relief. For her asthmatic bronchitis she was placed on a prednisone Dosepak along with antibiotics and antitussives agents and Flonase. She could not complete the course of her prednisone because she was hospitalized.  Home Medications: Prior to  Admission medications   Medication Sig Start Date End Date Taking? Authorizing Provider  acetaminophen (TYLENOL) 500 MG tablet Take 500 mg by mouth every 6 (six) hours as needed for fever.   Yes Historical Provider, MD  ALPRAZolam Duanne Moron) 0.5 MG tablet Take 0.5 mg by mouth 4 (four) times daily as needed. FOR ANXIETY   Yes Historical Provider, MD  amitriptyline (ELAVIL) 25 MG tablet Take 25 mg by mouth at bedtime.   Yes Historical Provider, MD  antiseptic oral rinse (BIOTENE) LIQD 15 mLs by Mouth Rinse route as needed for dry mouth.   Yes Historical Provider, MD  benzocaine (ORAJEL MOUTH-AID) 20 % GEL Use as directed 1 application in the mouth or throat at bedtime.    Yes Historical Provider, MD  bisacodyl (BISACODYL) 5 MG EC tablet Take 10 mg by mouth daily as needed for moderate constipation.   Yes Historical Provider, MD  carisoprodol (SOMA) 350 MG tablet Take 1 tablet by mouth daily. 07/11/13  Yes Historical Provider, MD  DEXILANT 60 MG capsule Take 1 capsule by mouth daily. 09/27/13  Yes Historical Provider, MD  eletriptan (RELPAX) 40 MG tablet One tablet by mouth at onset of headache. May repeat in 2 hours if headache persists or recurs. may repeat in 2 hours if necessary   Yes Historical Provider, MD  fluticasone (FLONASE) 50 MCG/ACT nasal spray Place 2 sprays into the nose daily.   Yes Historical Provider, MD  gabapentin (NEURONTIN) 600 MG tablet Take 600 mg by mouth at bedtime.    Yes Historical Provider, MD  hyoscyamine (NULEV) 0.125 MG TBDP Place 0.125 mg under the tongue  every 4 (four) hours as needed.   Yes Historical Provider, MD  levothyroxine (SYNTHROID, LEVOTHROID) 112 MCG tablet Take 112 mcg by mouth every morning.    Yes Historical Provider, MD  magnesium citrate SOLN Take 1 Bottle by mouth once.   Yes Historical Provider, MD  MINIVELLE 0.1 MG/24HR patch 1 patch 2 (two) times a week.  09/27/13  Yes Historical Provider, MD  Multiple Vitamin (MULITIVITAMIN WITH MINERALS) TABS Take 1  tablet by mouth every morning.    Yes Historical Provider, MD  Pediatric Multiple Vitamins (CHILDRENS MULTI-VITAMINS PO) Take 2 tablets by mouth daily.   Yes Historical Provider, MD  Polyethyl Glycol-Propyl Glycol (SYSTANE) 0.4-0.3 % SOLN Apply 1 drop to eye 2 (two) times daily.    Yes Historical Provider, MD  Probiotic Product (Hackberry) Take 1 tablet by mouth every morning.    Yes Historical Provider, MD  simethicone (GAS-X) 80 MG chewable tablet Chew 80 mg by mouth every 6 (six) hours as needed. FOR RELIEF   Yes Historical Provider, MD  SUMAtriptan-naproxen (TREXIMET) 85-500 MG per tablet Take 1 tablet by mouth daily as needed for migraine.   Yes Historical Provider, MD  venlafaxine XR (EFFEXOR-XR) 150 MG 24 hr capsule Take 300 mg by mouth at bedtime.   Yes Historical Provider, MD    Allergies:  Allergies  Allergen Reactions  . Aspirin   . Oat Other (See Comments)  . Vioxx [Rofecoxib] Diarrhea  . Celebrex [Celecoxib] Hives  . Metoclopramide Hcl Anxiety    BODY TREMORS    Past Medical History: Past Medical History  Diagnosis Date  . Celiac sprue   . Hypothyroidism (acquired)   . Fibromyalgia   . Arthritis   . Hyperlipemia   . Migraine   . Thyroid cancer   . Pancreatitis   . GERD (gastroesophageal reflux disease)   . Sjogren's disease   . Chronic anxiety   . Asthmatic bronchitis   . 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 Spastic colon   . Hyperlipidemia     Intolerant to statins  . Gastritis 10/08/2013  . Depression     Past Surgical History  Procedure Laterality Date  . Cholecystectomy    . Abdominal hysterectomy    . Thyroid lobectomy    . Bladder repair    . Nasal sinus surgery    . Esophagogastroduodenoscopy N/A 10/07/2013    Procedure: ESOPHAGOGASTRODUODENOSCOPY (EGD);  Surgeon: Danie Binder, MD;  Location: [facility];  Service: Endoscopy;  Laterality: N/A;  patient received heparin at 530am given phenergan 60m IV 30 minutes before     Social History: [place] She denies smoking, alcohol or illicit drug use. She is independent with daily activities.  Family History:  Family History  Problem Relation Age of Onset  . Pancreatitis Neg Hx   . Arthritis/Rheumatoid Mother     died in age 6146s . Colon cancer Neg Hx   . Breast cancer Maternal Grandmother   . Breast cancer Mother   . Breast cancer Maternal Aunt     X 2  . Lung cancer Maternal Aunt      Review of Systems - History obtained from the patient General ROS: positive for  - fatigue Psychological ROS: positive for - anxiety Ophthalmic ROS: negative ENT ROS: negative Allergy and Immunology ROS: negative Hematological and Lymphatic ROS: negative Endocrine ROS: negative Respiratory ROS: as in hpi Cardiovascular ROS: as in hpi Gastrointestinal ROS: as in hpi Genito-Urinary ROS:  no dysuria, trouble voiding, or hematuria Musculoskeletal ROS: negative Neurological ROS: migraine headaches Dermatological ROS: negative  Physical Examination  Filed Vitals:   10/14/13 0838 10/14/13 1112 10/14/13 1200  BP: 141/100 94/77 100/62  Pulse: 85 100 92  Temp: 98 F (36.7 C) 98 F (36.7 C)   TempSrc: Oral Oral   Resp:  18   Height: 5' 4"  (1.626 m)    Weight: 78.019 kg (172 lb)    SpO2: 98% 99% 100%    General appearance: alert, cooperative, appears stated age, no distress and moderately obese Head: Normocephalic, without obvious abnormality, atraumatic Eyes: conjunctivae/corneas clear. PERRL, EOM's intact. Throat: lips, mucosa, and tongue normal; teeth and gums normal Neck: no adenopathy, no carotid bruit, no JVD, supple, symmetrical, trachea midline and thyroid not enlarged, symmetric, no tenderness/mass/nodules Back: symmetric, no curvature. ROM normal. No CVA tenderness. Resp: Diffuse end expiratory wheezing bilaterally. No crackles. Cardio: regular rate and rhythm, S1, S2 normal, no murmur, click, rub or gallop GI: soft,  non-tender; bowel sounds normal; no masses,  no organomegaly Extremities: extremities normal, atraumatic, no cyanosis or edema. No calf tenderness Pulses: 2+ and symmetric Skin: Skin color, texture, turgor normal. No rashes or lesions Neurologic: She is alert and oriented x3. No focal neurological deficits are present.  Laboratory Data: Results for orders placed during the hospital encounter of 10/14/13 (from the past 48 hour(s))  PRO B NATRIURETIC PEPTIDE     Status: None   Collection Time    10/14/13  8:57 AM      Result Value Range   Pro B Natriuretic peptide (BNP) 30.4  0 - 125 pg/mL  CBC WITH DIFFERENTIAL     Status: Abnormal   Collection Time    10/14/13  8:59 AM      Result Value Range   WBC 11.1 (*) 4.0 - 10.5 K/uL   RBC 4.36  3.87 - 5.11 MIL/uL   Hemoglobin 13.2  12.0 - 15.0 g/dL   HCT 40.3  36.0 - 46.0 %   MCV 92.4  78.0 - 100.0 fL   MCH 30.3  26.0 - 34.0 pg   MCHC 32.8  30.0 - 36.0 g/dL   RDW 14.3  11.5 - 15.5 %   Platelets 298  150 - 400 K/uL   Neutrophils Relative % 65  43 - 77 %   Neutro Abs 7.2  1.7 - 7.7 K/uL   Lymphocytes Relative 21  12 - 46 %   Lymphs Abs 2.4  0.7 - 4.0 K/uL   Monocytes Relative 8  3 - 12 %   Monocytes Absolute 0.9  0.1 - 1.0 K/uL   Eosinophils Relative 5  0 - 5 %   Eosinophils Absolute 0.5  0.0 - 0.7 K/uL   Basophils Relative 1  0 - 1 %   Basophils Absolute 0.1  0.0 - 0.1 K/uL  POCT I-STAT, CHEM 8     Status: Abnormal   Collection Time    10/14/13  9:41 AM      Result Value Range   Sodium 142  135 - 145 mEq/L   Potassium 4.0  3.5 - 5.1 mEq/L   Chloride 106  96 - 112 mEq/L   BUN 19  6 - 23 mg/dL   Creatinine, Ser 1.00  0.50 - 1.10 mg/dL   Glucose, Bld 112 (*) 70 - 99 mg/dL   Calcium, Ion 1.23  1.12 - 1.23 mmol/L   TCO2 23  0 - 100 mmol/L   Hemoglobin 13.6  12.0 - 15.0 g/dL   HCT 40.0  36.0 - 46.0 %    Radiology Reports: Dg Chest 2 View  10/14/2013   CLINICAL DATA:  Shortness of breath  EXAM: CHEST  2 VIEW  COMPARISON:   10/06/2013  FINDINGS: The heart size and mediastinal contours are within normal limits. Both lungs are clear. The visualized skeletal structures are unremarkable.  IMPRESSION: No active cardiopulmonary disease.   Electronically Signed   By: Inez Catalina M.D.   On: 10/14/2013 09:57    Electrocardiogram: Sinus rhythm at 87 beats per minute. Normal axis. Intervals are normal. T inversions noted in V1, V2, and V3, which is old. No other ST changes noted. Similar to previous EKGs.  Problem List  Principal Problem:   Acute asthma exacerbation Active Problems:   Esophageal spasm   Chronic anxiety   GERD (gastroesophageal reflux disease)   Hypothyroidism (acquired)   Chronic migraine   Acute respiratory failure   Assessment: This is a 57 year old, Caucasian female, with a past medical history as stated earlier, presents with two-day history of cough, wheezing. This appears to be acute asthma exacerbation. She has significant wheezes in bilateral lung fields. Other differentials considered included congestive heart failure, although her x-ray and examination is not suggestive of the same. Her pro BNP is normal as well. She was incidentally noted to have grade 2 diastolic dysfunction on recent echocardiogram. Venous thromboembolism was also considered. However, she's not tachycardic nor hypoxic and she has diffuse wheezing, which does not suggest VTE.  Plan: #1 acute asthma exacerbation: She'll be admitted to the hospital and given Nebulizer treatments, steroids. We will give her another course of antibiotics as well. Oxygen will be provided if needed. She may benefit from an outpatient Pulmonary function test.  #2 history of GERD and esophageal spasm with chest pains: Continue with PPI. Maalox as needed. She will need to contact the gastroenterologist regarding biopsy results from recent EGD. Pathology report suggests mainly gastritis without any other concerning findings  #3 history of depression  and anxiety: Continue with her psychotropic agents.  #4 history of migraine headaches: Continue with her home medications. Avoid NSAIDs as much as possible.  #5 history of hypothyroidism: Continue with l-thyroxine. She status post thyroidectomy for thyroid cancer in the past.   DVT Prophylaxis: Lovenox Code Status: Full code Family Communication: Discussed with the patient and her husband  Disposition Plan: Will return home when improved   Further management decisions will depend on results of further testing and patient's response to treatment.  [facility]  [facility] Pager [phone]  If 7PM-7AM, please contact night-coverage [email or web address] Password [email or web address]  10/14/2013, 1:15 PM

## 2013-10-14 NOTE — ED Notes (Signed)
Pt arrived from home by ems, stated she has been sick w/ asthma for 3 weeks. Was in the hosp. Last week for 2 days for acid reflux.  Last night, started having more trouble breathing. Denies any cp, sob at times. Mild nausea.

## 2013-10-14 NOTE — Progress Notes (Signed)
RT came to see if patient was ready to take CAT. Per patient's family RN  started  The treatment at approximately 1120 .

## 2013-10-15 DIAGNOSIS — F411 Generalized anxiety disorder: Secondary | ICD-10-CM

## 2013-10-15 LAB — COMPREHENSIVE METABOLIC PANEL
ALT: 22 U/L (ref 0–35)
AST: 15 U/L (ref 0–37)
Albumin: 3.4 g/dL — ABNORMAL LOW (ref 3.5–5.2)
Alkaline Phosphatase: 86 U/L (ref 39–117)
CO2: 24 mEq/L (ref 19–32)
Calcium: 9.1 mg/dL (ref 8.4–10.5)
GFR calc non Af Amer: >90 mL/min (ref >90)
Glucose, Bld: 144 mg/dL — ABNORMAL HIGH (ref 70–99)
Potassium: 3.6 mEq/L (ref 3.5–5.1)
Sodium: 138 mEq/L (ref 135–145)

## 2013-10-15 LAB — CBC
HCT: 37 % (ref 36.0–46.0)
Hemoglobin: 12 g/dL (ref 12.0–15.0)
MCH: 29.9 pg (ref 26.0–34.0)
MCHC: 32.4 g/dL (ref 30.0–36.0)
MCV: 92 fL (ref 78.0–100.0)
RBC: 4.02 MIL/uL (ref 3.87–5.11)
RDW: 14.4 % (ref 11.5–15.5)

## 2013-10-15 MED ORDER — METHYLPREDNISOLONE SODIUM SUCC 125 MG IJ SOLR
60.0000 mg | Freq: Two times a day (BID) | INTRAMUSCULAR | Status: DC
  Administered 2013-10-15: 60 mg via INTRAVENOUS
  Filled 2013-10-15: qty 2

## 2013-10-15 MED ORDER — ALBUTEROL SULFATE (5 MG/ML) 0.5% IN NEBU
2.5000 mg | INHALATION_SOLUTION | RESPIRATORY_TRACT | Status: DC
  Administered 2013-10-15 – 2013-10-16 (×3): 2.5 mg via RESPIRATORY_TRACT
  Filled 2013-10-15 (×3): qty 0.5

## 2013-10-15 MED ORDER — DEXTROSE 5 % IV SOLN
500.0000 mg | INTRAVENOUS | Status: DC
  Administered 2013-10-15: 500 mg via INTRAVENOUS
  Filled 2013-10-15 (×3): qty 500

## 2013-10-15 MED ORDER — ZOLPIDEM TARTRATE 5 MG PO TABS
5.0000 mg | ORAL_TABLET | Freq: Once | ORAL | Status: AC
  Administered 2013-10-15: 5 mg via ORAL
  Filled 2013-10-15: qty 1

## 2013-10-15 MED ORDER — MAGNESIUM CITRATE PO SOLN
1.0000 | Freq: Once | ORAL | Status: AC
  Administered 2013-10-15: 1 via ORAL
  Filled 2013-10-15: qty 296

## 2013-10-15 MED ORDER — MAGNESIUM CITRATE PO SOLN
1.0000 | Freq: Every day | ORAL | Status: DC | PRN
  Administered 2013-10-15: 1 via ORAL
  Filled 2013-10-15: qty 296

## 2013-10-15 MED ORDER — MONTELUKAST SODIUM 10 MG PO TABS
10.0000 mg | ORAL_TABLET | Freq: Every day | ORAL | Status: DC
  Administered 2013-10-15: 10 mg via ORAL
  Filled 2013-10-15: qty 1

## 2013-10-15 MED ORDER — IPRATROPIUM BROMIDE 0.02 % IN SOLN
0.5000 mg | RESPIRATORY_TRACT | Status: DC
  Administered 2013-10-15 – 2013-10-16 (×3): 0.5 mg via RESPIRATORY_TRACT
  Filled 2013-10-15 (×3): qty 2.5

## 2013-10-15 NOTE — Progress Notes (Signed)
Patient received one dose of Magnesium Citrate earlier.  Patient has no relief from it yet.  States that she is passing gas.  Dr. Candiss Norse on unit and notified.  Gave order for patient to receive a one time dose of one bottle of Magnesium Citrate.

## 2013-10-15 NOTE — Progress Notes (Signed)
Patient c/o constipation. States the Miralax does not work for her and the Dulcolax from last night did not work.  Patient states she takes Mag Citrate at home.  Patient states she usually drink the entire bottle for constipation relief.  Dr. Candiss Norse notified via text page.

## 2013-10-15 NOTE — Progress Notes (Signed)
[facility]                                                                                Patient Demographics  Susan [name], is a 57 y.o. female, DOB - Mar 02, 1956, GEX:[id number]  Admit date - 10/14/2013   Admitting Physician Murlean Iba, MD  Outpatient Primary MD for the patient is Lenard Simmer, MD  LOS - 1   Chief Complaint  Patient presents with  . Asthma        Assessment & Plan    1. Acute asthma exacerbation - is somewhat better, wheezing has improved but still has significant cough. Continue IV sulfa Medrol, nebulizer treatments as needed, she's not requiring oxygen. We'll switch her to azithromycin due to her nature of cough for the possibility of atypical infection. Will add Zyrtec or Singulair to counter any allergic component.     2. History of GERD and esophageal spasm with chest pains: Continue with PPI. Maalox as needed. She will need to contact the gastroenterologist regarding biopsy results from recent EGD. Pathology report suggests mainly gastritis without any other concerning findings     3 History of depression and anxiety: Continue with her psychotropic agents.     4 History of migraine headaches: Continue with her home medications. Avoid NSAIDs as much as possible.     5 History of hypothyroidism: Continue with l-thyroxine. She status post thyroidectomy for thyroid cancer in the past.      Code Status: Full  Family Communication:    Disposition Plan: home   Procedures     Consults      Medications  Scheduled Meds: . albuterol  2.5 mg Nebulization Q4H  . amitriptyline  25 mg Oral QHS  . carisoprodol  350 mg Oral Daily  . chlorpheniramine-HYDROcodone  5 mL Oral Q12H  . enoxaparin (LOVENOX) injection  40 mg Subcutaneous Q24H  . gabapentin  600 mg Oral QHS  . ipratropium  0.5 mg Nebulization Q4H  . levofloxacin (LEVAQUIN) IV  500 mg Intravenous Q24H  . levothyroxine  112 mcg Oral q morning - 10a  .  methylPREDNISolone (SOLU-MEDROL) injection  60 mg Intravenous Q12H  . pantoprazole  40 mg Oral Daily  . polyethylene glycol  17 g Oral Daily  . sodium chloride  3 mL Intravenous Q12H  . venlafaxine XR  300 mg Oral QHS   Continuous Infusions:  PRN Meds:.acetaminophen, acetaminophen, albuterol, ALPRAZolam, alum & mag hydroxide-simeth, bisacodyl, guaiFENesin-dextromethorphan, hyoscyamine, naproxen, ondansetron (ZOFRAN) IV, ondansetron, oxyCODONE, SUMAtriptan  DVT Prophylaxis  Lovenox   Lab Results  Component Value Date   PLT 258 10/15/2013    Antibiotics    Anti-infectives   Start     Dose/Rate Route Frequency Ordered Stop   10/14/13 1330  levofloxacin (LEVAQUIN) IVPB 500 mg     500 mg 100 mL/hr over 60 Minutes Intravenous Every 24 hours 10/14/13 1326            Subjective:   Shawn Route today has, No headache, No chest pain, No abdominal pain - No Nausea, No new weakness tingling or numbness, ++ Cough -  Minimal SOB.    Objective:   Filed Vitals:   10/14/13 2310  10/15/13 0014 10/15/13 0419 10/15/13 0732  BP: 124/67   97/55  Pulse: 95   88  Temp: 98.5 F (36.9 C)   97.3 F (36.3 C)  TempSrc: Oral   Oral  Resp: 20   20  Height:      Weight:      SpO2: 100% 98% 93% 99%    Wt Readings from Last 3 Encounters:  10/14/13 78.019 kg (172 lb)  10/07/13 83.462 kg (184 lb)  10/07/13 83.462 kg (184 lb)     Intake/Output Summary (Last 24 hours) at 10/15/13 0959 Last data filed at 10/14/13 2300  Gross per 24 hour  Intake    240 ml  Output    200 ml  Net     40 ml    Exam Awake Alert, Oriented X 3, No new F.N deficits, Normal affect [place].AT,PERRAL Supple Neck,No JVD, No cervical lymphadenopathy appriciated.  Symmetrical Chest wall movement, Good air movement bilaterally, minimal wheezing RRR,No Gallops,Rubs or new Murmurs, No Parasternal Heave +ve B.Sounds, Abd Soft, Non tender, No organomegaly appriciated, No rebound - guarding or rigidity. No Cyanosis, Clubbing  or edema, No new Rash or bruise      Data Review   Micro Results No results found for this or any previous visit (from the past 240 hour(s)).  Radiology Reports Dg Chest 2 View  10/14/2013   CLINICAL DATA:  Shortness of breath  EXAM: CHEST  2 VIEW  COMPARISON:  10/06/2013  FINDINGS: The heart size and mediastinal contours are within normal limits. Both lungs are clear. The visualized skeletal structures are unremarkable.  IMPRESSION: No active cardiopulmonary disease.   Electronically Signed   By: Inez Catalina M.D.   On: 10/14/2013 09:57   Ct Angio Chest Aorta W/cm &/or Wo/cm  10/06/2013   CLINICAL DATA:  Chest and back pain.  EXAM: CT ANGIOGRAPHY CHEST, ABDOMEN AND PELVIS  TECHNIQUE: Multidetector CT imaging through the chest, abdomen and pelvis was performed using the standard protocol during bolus administration of intravenous contrast. Multiplanar reconstructed images including MIPs were obtained and reviewed to evaluate the vascular anatomy.  CONTRAST:  151m OMNIPAQUE IOHEXOL 350 MG/ML SOLN  COMPARISON:  CT scan of February 11, 2012.  FINDINGS: CTA CHEST FINDINGS  No pleural effusion or pneumothorax is noted. No abnormality is seen in the pulmonary parenchyma. No mediastinal mass or adenopathy is noted. The thoracic aorta appears normal without evidence of aneurysm or dissection. The proximal great vessels appear normal. The pulmonary artery and its branches appear normal. No definite osseous abnormality is noted in the chest.  Review of the MIP images confirms the above findings.  CTA ABDOMEN AND PELVIS FINDINGS  The abdominal aorta and its mesenteric and renal branches appear widely patent. Mild atherosclerotic calcifications of abdominal aorta are noted without aneurysm or dissection. The visualized portions of the iliac arteries appear normal. Status post cholecystectomy. The liver, spleen and pancreas appear normal. Adrenal glands and kidneys appear normal. No evidence of bowel obstruction is  noted. No abnormal fluid collection is noted. The appendix appears normal. Urinary bladder appears normal. No osseous abnormality is noted.  Review of the MIP images confirms the above findings.  IMPRESSION: No evidence of aneurysm or dissection seen involving the thoracic or abdominal aorta.   Electronically Signed   By: JSabino DickM.D.   On: 10/06/2013 15:16   Ct Angio Abd/pel W/ And/or W/o  10/06/2013   CLINICAL DATA:  Chest and back pain.  EXAM: CT ANGIOGRAPHY CHEST, ABDOMEN  AND PELVIS  TECHNIQUE: Multidetector CT imaging through the chest, abdomen and pelvis was performed using the standard protocol during bolus administration of intravenous contrast. Multiplanar reconstructed images including MIPs were obtained and reviewed to evaluate the vascular anatomy.  CONTRAST:  155m OMNIPAQUE IOHEXOL 350 MG/ML SOLN  COMPARISON:  CT scan of February 11, 2012.  FINDINGS: CTA CHEST FINDINGS  No pleural effusion or pneumothorax is noted. No abnormality is seen in the pulmonary parenchyma. No mediastinal mass or adenopathy is noted. The thoracic aorta appears normal without evidence of aneurysm or dissection. The proximal great vessels appear normal. The pulmonary artery and its branches appear normal. No definite osseous abnormality is noted in the chest.  Review of the MIP images confirms the above findings.  CTA ABDOMEN AND PELVIS FINDINGS  The abdominal aorta and its mesenteric and renal branches appear widely patent. Mild atherosclerotic calcifications of abdominal aorta are noted without aneurysm or dissection. The visualized portions of the iliac arteries appear normal. Status post cholecystectomy. The liver, spleen and pancreas appear normal. Adrenal glands and kidneys appear normal. No evidence of bowel obstruction is noted. No abnormal fluid collection is noted. The appendix appears normal. Urinary bladder appears normal. No osseous abnormality is noted.  Review of the MIP images confirms the above findings.   IMPRESSION: No evidence of aneurysm or dissection seen involving the thoracic or abdominal aorta.   Electronically Signed   By: JSabino DickM.D.   On: 10/06/2013 15:16    CBC  Recent Labs Lab 10/14/13 0859 10/14/13 0941 10/15/13 0554  WBC 11.1*  --  9.8  HGB 13.2 13.6 12.0  HCT 40.3 40.0 37.0  PLT 298  --  258  MCV 92.4  --  92.0  MCH 30.3  --  29.9  MCHC 32.8  --  32.4  RDW 14.3  --  14.4  LYMPHSABS 2.4  --   --   MONOABS 0.9  --   --   EOSABS 0.5  --   --   BASOSABS 0.1  --   --     Chemistries   Recent Labs Lab 10/14/13 0941 10/15/13 0554  NA 142 138  K 4.0 3.6  CL 106 104  CO2  --  24  GLUCOSE 112* 144*  BUN 19 16  CREATININE 1.00 0.78  CALCIUM  --  9.1  AST  --  15  ALT  --  22  ALKPHOS  --  86  BILITOT  --  0.2*   ------------------------------------------------------------------------------------------------------------------ estimated creatinine clearance is 77.4 ml/min (by C-G formula based on Cr of 0.78). ------------------------------------------------------------------------------------------------------------------ No results found for this basename: HGBA1C,  in the last 72 hours ------------------------------------------------------------------------------------------------------------------ No results found for this basename: CHOL, HDL, LDLCALC, TRIG, CHOLHDL, LDLDIRECT,  in the last 72 hours ------------------------------------------------------------------------------------------------------------------ No results found for this basename: TSH, T4TOTAL, FREET3, T3FREE, THYROIDAB,  in the last 72 hours ------------------------------------------------------------------------------------------------------------------ No results found for this basename: VITAMINB12, FOLATE, FERRITIN, TIBC, IRON, RETICCTPCT,  in the last 72 hours  Coagulation profile No results found for this basename: INR, PROTIME,  in the last 168 hours  No results found for this  basename: DDIMER,  in the last 72 hours  Cardiac Enzymes  Recent Labs Lab 10/14/13 1334 10/14/13 1913  TROPONINI <0.30 <0.30   ------------------------------------------------------------------------------------------------------------------ No components found with this basename: POCBNP,      Time Spent in minutes 35   SLala LundK M.D on 10/15/2013 at 9:59 AM  Between 7am to 7pm - Pager -  [phone]  After 7pm go to [email or web address] - password [email or web address]  And look for the night coverage person covering for me after hours  [facility] Office  [phone]

## 2013-10-16 DIAGNOSIS — IMO0001 Reserved for inherently not codable concepts without codable children: Secondary | ICD-10-CM

## 2013-10-16 DIAGNOSIS — E039 Hypothyroidism, unspecified: Secondary | ICD-10-CM

## 2013-10-16 DIAGNOSIS — K219 Gastro-esophageal reflux disease without esophagitis: Secondary | ICD-10-CM

## 2013-10-16 MED ORDER — ALBUTEROL SULFATE (5 MG/ML) 0.5% IN NEBU: 2.5000 mg | INHALATION_SOLUTION | Freq: Four times a day (QID) | RESPIRATORY_TRACT | Status: DC | PRN

## 2013-10-16 MED ORDER — AZITHROMYCIN 500 MG PO TABS: 500.0000 mg | ORAL_TABLET | Freq: Every day | ORAL | Status: DC

## 2013-10-16 MED ORDER — METHYLPREDNISOLONE 4 MG PO KIT: PACK | ORAL | Status: DC

## 2013-10-16 MED ORDER — MONTELUKAST SODIUM 10 MG PO TABS: 10.0000 mg | ORAL_TABLET | Freq: Every day | ORAL | Status: DC

## 2013-10-16 MED ORDER — CARISOPRODOL 350 MG PO TABS: 350.0000 mg | ORAL_TABLET | Freq: Every day | ORAL | Status: DC

## 2013-10-16 MED ORDER — ALBUTEROL SULFATE HFA 108 (90 BASE) MCG/ACT IN AERS: 2.0000 | INHALATION_SPRAY | Freq: Four times a day (QID) | RESPIRATORY_TRACT | Status: DC | PRN

## 2013-10-16 NOTE — Progress Notes (Signed)
Arranged for nebulizer machine to be delivered to pt's home.

## 2013-10-16 NOTE — Progress Notes (Signed)
IV removed, site WNL.  Pt given d/c instructions and new prescriptions.  Discussed home care with patient and discussed home medications, patient verbalizes understanding, teachback completed. F/U appointment will be made by patient when office opens on Monday, pt states she will make and keep appointment. Pt is stable at this time and desires to go home- waiting on husband for transportation home.

## 2013-10-16 NOTE — Discharge Summary (Signed)
[facility]                                                                                   OMOLARA CAROL, is a 57 y.o. female  DOB [birth year]  MRN [id number].  Admission date:  10/14/2013  Admitting Physician  Murlean Iba, MD  Discharge Date:  10/16/2013   Primary MD  Lenard Simmer, MD  Recommendations for primary care physician for things to follow:   Follow clinically for asthma   Admission Diagnosis  Acute respiratory failure [518.81] Depression with anxiety [300.4] Asthma exacerbation [493.92] Acute asthma exacerbation [493.92] Chronic migraine [346.70]  Discharge Diagnosis   asthma exacerbation  Principal Problem:   Acute asthma exacerbation Active Problems:   Esophageal spasm   Chronic anxiety   GERD (gastroesophageal reflux disease)   Hypothyroidism (acquired)   Chronic migraine   Acute respiratory failure      Past Medical History  Diagnosis Date  . Celiac sprue   . Hypothyroidism (acquired)   . Fibromyalgia   . Arthritis   . Hyperlipemia   . Migraine   . Thyroid cancer   . Pancreatitis   . GERD (gastroesophageal reflux disease)   . Sjogren's disease   . Chronic anxiety   . Asthmatic bronchitis   . 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 Spastic colon   . Hyperlipidemia     Intolerant to statins  . Gastritis 10/08/2013  . Depression     Past Surgical History  Procedure Laterality Date  . Cholecystectomy    . Abdominal hysterectomy    . Thyroid lobectomy    . Bladder repair    . Nasal sinus surgery    . Esophagogastroduodenoscopy N/A 10/07/2013    Procedure: ESOPHAGOGASTRODUODENOSCOPY (EGD);  Surgeon: Danie Binder, MD;  Location: [facility];  Service: Endoscopy;  Laterality: N/A;  patient received heparin at 530am given phenergan 75m IV 30 minutes before     Discharge Condition: stable   Follow-up Information   Follow up with MLenard Simmer MD. Schedule an appointment as soon as possible for a visit in 1 week.    Specialty:  Radiology   Contact information:   [place] [phone]        Consults obtained -     Discharge Medications      Medication List         acetaminophen 500 MG tablet  Commonly known as:  TYLENOL  Take 500 mg by mouth every 6 (six) hours as needed for fever.     albuterol 108 (90 BASE) MCG/ACT inhaler  Commonly known as:  PROVENTIL HFA;VENTOLIN HFA  Inhale 2 puffs into the lungs every 6 (six) hours as needed for wheezing or shortness of breath.     albuterol (5 MG/ML) 0.5% nebulizer solution  Commonly known as:  PROVENTIL  Take 0.5 mLs (2.5 mg total) by nebulization every 6 (six) hours as needed for wheezing or shortness of breath.     ALPRAZolam 0.5 MG tablet  Commonly known as:  XANAX  Take 0.5 mg by mouth 4 (four) times daily as needed. FOR ANXIETY     amitriptyline 25 MG tablet  Commonly known as:  ELAVIL  Take 25 mg by mouth at bedtime.     antiseptic oral rinse Liqd  15 mLs by Mouth Rinse route as needed for dry mouth.     azithromycin 500 MG tablet  Commonly known as:  ZITHROMAX  Take 1 tablet (500 mg total) by mouth daily.     bisacodyl 5 MG EC tablet  Generic drug:  bisacodyl  Take 10 mg by mouth daily as needed for moderate constipation.     carisoprodol 350 MG tablet  Commonly known as:  SOMA  Take 1 tablet by mouth daily.     CHILDRENS MULTI-VITAMINS PO  Take 2 tablets by mouth daily.     DEXILANT 60 MG capsule  Generic drug:  dexlansoprazole  Take 1 capsule by mouth daily.     eletriptan 40 MG tablet  Commonly known as:  RELPAX  One tablet by mouth at onset of headache. May repeat in 2 hours if headache persists or recurs. may repeat in 2 hours if necessary     fluticasone 50 MCG/ACT nasal spray  Commonly known as:  FLONASE  Place 2 sprays into the nose daily.     gabapentin 600 MG tablet  Commonly known as:  NEURONTIN  Take 600 mg by mouth at bedtime.     GAS-X 80 MG chewable tablet   Generic drug:  simethicone  Chew 80 mg by mouth every 6 (six) hours as needed. FOR RELIEF     levothyroxine 112 MCG tablet  Commonly known as:  SYNTHROID, LEVOTHROID  Take 112 mcg by mouth every morning.     magnesium citrate Soln  Take 1 Bottle by mouth once.     methylPREDNISolone 4 MG tablet  Commonly known as:  MEDROL DOSEPAK  follow package directions     MINIVELLE 0.1 MG/24HR patch  Generic drug:  estradiol  1 patch 2 (two) times a week.     montelukast 10 MG tablet  Commonly known as:  SINGULAIR  Take 1 tablet (10 mg total) by mouth daily.     multivitamin with minerals Tabs tablet  Take 1 tablet by mouth every morning.     NULEV 0.125 MG Tbdp disintergrating tablet  Generic drug:  hyoscyamine  Place 0.125 mg under the tongue every 4 (four) hours as needed.     ORAJEL MOUTH-AID 20 % Gel  Generic drug:  benzocaine  Use as directed 1 application in the mouth or throat at bedtime.     PHILLIPS COLON HEALTH PO  Take 1 tablet by mouth every morning.     SYSTANE 0.4-0.3 % Soln  Generic drug:  Polyethyl Glycol-Propyl Glycol  Apply 1 drop to eye 2 (two) times daily.     TREXIMET 85-500 MG per tablet  Generic drug:  SUMAtriptan-naproxen  Take 1 tablet by mouth daily as needed for migraine.     venlafaxine XR 150 MG 24 hr capsule  Commonly known as:  EFFEXOR-XR  Take 300 mg by mouth at bedtime.         Diet and Activity recommendation: See Discharge Instructions below   Discharge Instructions     Follow with Primary MD Lenard Simmer, MD in 7 days   Get CBC, CMP, checked 7 days by Primary MD and again as instructed by your Primary MD. Get a 2 view Chest X ray done next visit if.  Get Medicines reviewed and adjusted.  Please request your Prim.MD to go over all Hospital Tests and Procedure/Radiological results  at the follow up, please get all Hospital records sent to your Prim MD by signing hospital release before you go home.  Activity: As tolerated  with Full fall precautions use walker/cane & assistance as needed   Diet:  Heart healthy.  For Heart failure patients - Check your Weight same time everyday, if you gain over 2 pounds, or you develop in leg swelling, experience more shortness of breath or chest pain, call your Primary MD immediately. Follow Cardiac Low Salt Diet and 1.8 lit/day fluid restriction.  Disposition Home   If you experience worsening of your admission symptoms, develop shortness of breath, life threatening emergency, suicidal or homicidal thoughts you must seek medical attention immediately by calling 911 or calling your MD immediately  if symptoms less severe.  You Must read complete instructions/literature along with all the possible adverse reactions/side effects for all the Medicines you take and that have been prescribed to you. Take any new Medicines after you have completely understood and accpet all the possible adverse reactions/side effects.   Do not drive and provide baby sitting services if your were admitted for syncope or siezures until you have seen by Primary MD or a Neurologist and advised to do so again.  Do not drive when taking Pain medications.    Do not take more than prescribed Pain, Sleep and Anxiety Medications  Special Instructions: If you have smoked or chewed Tobacco  in the last 2 yrs please stop smoking, stop any regular Alcohol  and or any Recreational drug use.  Wear Seat belts while driving.   Please note  You were cared for by a hospitalist during your hospital stay. If you have any questions about your discharge medications or the care you received while you were in the hospital after you are discharged, you can call the unit and asked to speak with the hospitalist on call if the hospitalist that took care of you is not available. Once you are discharged, your primary care physician will handle any further medical issues. Please note that NO REFILLS for any discharge medications  will be authorized once you are discharged, as it is imperative that you return to your primary care physician (or establish a relationship with a primary care physician if you do not have one) for your aftercare needs so that they can reassess your need for medications and monitor your lab values.   Major procedures and Radiology Reports - PLEASE review detailed and final reports for all details, in brief -       Dg Chest 2 View  10/14/2013   CLINICAL DATA:  Shortness of breath  EXAM: CHEST  2 VIEW  COMPARISON:  10/06/2013  FINDINGS: The heart size and mediastinal contours are within normal limits. Both lungs are clear. The visualized skeletal structures are unremarkable.  IMPRESSION: No active cardiopulmonary disease.   Electronically Signed   By: Inez Catalina M.D.   On: 10/14/2013 09:57   Ct Angio Chest Aorta W/cm &/or Wo/cm  10/06/2013   CLINICAL DATA:  Chest and back pain.  EXAM: CT ANGIOGRAPHY CHEST, ABDOMEN AND PELVIS  TECHNIQUE: Multidetector CT imaging through the chest, abdomen and pelvis was performed using the standard protocol during bolus administration of intravenous contrast. Multiplanar reconstructed images including MIPs were obtained and reviewed to evaluate the vascular anatomy.  CONTRAST:  172m OMNIPAQUE IOHEXOL 350 MG/ML SOLN  COMPARISON:  CT scan of February 11, 2012.  FINDINGS: CTA CHEST FINDINGS  No pleural effusion or pneumothorax is noted.  No abnormality is seen in the pulmonary parenchyma. No mediastinal mass or adenopathy is noted. The thoracic aorta appears normal without evidence of aneurysm or dissection. The proximal great vessels appear normal. The pulmonary artery and its branches appear normal. No definite osseous abnormality is noted in the chest.  Review of the MIP images confirms the above findings.  CTA ABDOMEN AND PELVIS FINDINGS  The abdominal aorta and its mesenteric and renal branches appear widely patent. Mild atherosclerotic calcifications of abdominal aorta  are noted without aneurysm or dissection. The visualized portions of the iliac arteries appear normal. Status post cholecystectomy. The liver, spleen and pancreas appear normal. Adrenal glands and kidneys appear normal. No evidence of bowel obstruction is noted. No abnormal fluid collection is noted. The appendix appears normal. Urinary bladder appears normal. No osseous abnormality is noted.  Review of the MIP images confirms the above findings.  IMPRESSION: No evidence of aneurysm or dissection seen involving the thoracic or abdominal aorta.   Electronically Signed   By: Sabino Dick M.D.   On: 10/06/2013 15:16   Ct Angio Abd/pel W/ And/or W/o  10/06/2013   CLINICAL DATA:  Chest and back pain.  EXAM: CT ANGIOGRAPHY CHEST, ABDOMEN AND PELVIS  TECHNIQUE: Multidetector CT imaging through the chest, abdomen and pelvis was performed using the standard protocol during bolus administration of intravenous contrast. Multiplanar reconstructed images including MIPs were obtained and reviewed to evaluate the vascular anatomy.  CONTRAST:  169m OMNIPAQUE IOHEXOL 350 MG/ML SOLN  COMPARISON:  CT scan of February 11, 2012.  FINDINGS: CTA CHEST FINDINGS  No pleural effusion or pneumothorax is noted. No abnormality is seen in the pulmonary parenchyma. No mediastinal mass or adenopathy is noted. The thoracic aorta appears normal without evidence of aneurysm or dissection. The proximal great vessels appear normal. The pulmonary artery and its branches appear normal. No definite osseous abnormality is noted in the chest.  Review of the MIP images confirms the above findings.  CTA ABDOMEN AND PELVIS FINDINGS  The abdominal aorta and its mesenteric and renal branches appear widely patent. Mild atherosclerotic calcifications of abdominal aorta are noted without aneurysm or dissection. The visualized portions of the iliac arteries appear normal. Status post cholecystectomy. The liver, spleen and pancreas appear normal. Adrenal glands and  kidneys appear normal. No evidence of bowel obstruction is noted. No abnormal fluid collection is noted. The appendix appears normal. Urinary bladder appears normal. No osseous abnormality is noted.  Review of the MIP images confirms the above findings.  IMPRESSION: No evidence of aneurysm or dissection seen involving the thoracic or abdominal aorta.   Electronically Signed   By: JSabino DickM.D.   On: 10/06/2013 15:16    Micro Results      No results found for this or any previous visit (from the past 240 hour(s)).   History of present illness and  Hospital Course:     Kindly see H&P for history of present illness and admission details, please review complete Labs, Consult reports and Test reports for all details in brief Susan [name] is a 57y.o. female, patient with history of  Sjogren's disease, celiac disease, GERD, Nutcracker esophagus, who was recently hospitalized for chest pain, which was thought to be secondary to GI etiology. She underwent an EGD, which did not show any esophageal lesions. However, did show gastritis. It was felt that her symptoms were due to esophageal spasms, she presented this time with chest tightness and shortness of breath along with wheezing  secondary to asthma exacerbation.     For chief complaints of shortness of breath caused by acute asthma exacerbation she was placed initially on IV steroids along with scheduled and when necessary nebulizer treatments, she also had atypical cough suggestive of mild URI for which she was placed on azithromycin. With the above care she showed remarkable improvement, currently besides mild cough she has no wheezing or shortness of breath no requirement of oxygen. She will be now discharged home on the Medrol Dosepak along with azithromycin, hand-held rescue inhaler, albuterol nebulizer treatments with a nebulizer machine and Singulair to alleviate any allergic component.   She has underlying history of Sjogren's disease,  migraine headaches, hypothyroidism, depression, anxiety, fibromyalgia, chronic constipation, GERD for which he will commence her home medications unchanged.       Today   Subjective:   Clyda Smyth today has no headache,no chest abdominal pain,no new weakness tingling or numbness, feels much better wants to go home today.    Objective:   Blood pressure 116/73, pulse 80, temperature 98.1 F (36.7 C), temperature source Oral, resp. rate 20, height 5' 4"  (1.626 m), weight 78.019 kg (172 lb), SpO2 96.00%.   Intake/Output Summary (Last 24 hours) at 10/16/13 0848 Last data filed at 10/16/13 0643  Gross per 24 hour  Intake    240 ml  Output      0 ml  Net    240 ml    Exam Awake Alert, Oriented *3, No new F.N deficits, Normal affect [place].AT,PERRAL Supple Neck,No JVD, No cervical lymphadenopathy appriciated.  Symmetrical Chest wall movement, Good air movement bilaterally, CTAB RRR,No Gallops,Rubs or new Murmurs, No Parasternal Heave +ve B.Sounds, Abd Soft, Non tender, No organomegaly appriciated, No rebound -guarding or rigidity. No Cyanosis, Clubbing or edema, No new Rash or bruise  Data Review   CBC w Diff: Lab Results  Component Value Date   WBC 9.8 10/15/2013   HGB 12.0 10/15/2013   HCT 37.0 10/15/2013   PLT 258 10/15/2013   LYMPHOPCT 21 10/14/2013   MONOPCT 8 10/14/2013   EOSPCT 5 10/14/2013   BASOPCT 1 10/14/2013    CMP: Lab Results  Component Value Date   NA 138 10/15/2013   K 3.6 10/15/2013   CL 104 10/15/2013   CO2 24 10/15/2013   BUN 16 10/15/2013   CREATININE 0.78 10/15/2013   PROT 6.8 10/15/2013   ALBUMIN 3.4* 10/15/2013   BILITOT 0.2* 10/15/2013   ALKPHOS 86 10/15/2013   AST 15 10/15/2013   ALT 22 10/15/2013  .   Total Time in preparing paper work, data evaluation and todays exam - 35 minutes  Thurnell Lose M.D on 10/16/2013 at 8:48 AM  [facility] Office  [phone]

## 2013-10-18 ENCOUNTER — Telehealth: Payer: Self-pay | Admitting: Gastroenterology

## 2013-10-18 NOTE — Telephone Encounter (Signed)
Called and informed pt.  

## 2013-10-18 NOTE — Telephone Encounter (Addendum)
Please call pt. HER stomach Bx shows MODERATE gastritis.   CONTINUE DEXILANT FOLLOW A LOW FAT DIET.  OPV WITH DR. Gustavo Lah IN 2-3 WEEKS IN [place], [place].   CC: DR. SHAMIL MORAYATI

## 2013-10-18 NOTE — Telephone Encounter (Signed)
LMOM to call.

## 2013-10-23 NOTE — ED Provider Notes (Signed)
Medical screening examination/treatment/procedure(s) were conducted as a shared visit with non-physician practitioner(s) and myself.  I personally evaluated the patient during the encounter.  EKG Interpretation    Date/Time:  Friday October 14 2013 08:35:53 EST Ventricular Rate:  87 PR Interval:  162 QRS Duration: 88 QT Interval:  394 QTC Calculation: 474 R Axis:   72 Text Interpretation:  Normal sinus rhythm Cannot rule out Anterior infarct , age undetermined Abnormal ECG ED PHYSICIAN INTERPRETATION AVAILABLE IN [facility] [place] Confirmed by TEST, RECORD ([id number]) on 10/17/2013 6:23:24 AM             Pt stabilized in the ED but required admission for further treatment and monitoring Pt agreeable with plan  Sharyon Cable, MD 10/23/13 [phone]

## 2014-05-16 LAB — HM PAP SMEAR: HM Pap smear: NEGATIVE

## 2014-05-27 DIAGNOSIS — Z9189 Other specified personal risk factors, not elsewhere classified: Secondary | ICD-10-CM

## 2014-05-27 DIAGNOSIS — Z803 Family history of malignant neoplasm of breast: Secondary | ICD-10-CM

## 2014-05-27 DIAGNOSIS — Z1371 Encounter for nonprocreative screening for genetic disease carrier status: Secondary | ICD-10-CM

## 2014-05-27 HISTORY — DX: Other specified personal risk factors, not elsewhere classified: Z91.89

## 2014-05-27 HISTORY — DX: Encounter for nonprocreative screening for genetic disease carrier status: Z13.71

## 2014-05-27 HISTORY — DX: Family history of malignant neoplasm of breast: Z80.3

## 2014-06-29 ENCOUNTER — Other Ambulatory Visit: Payer: Self-pay | Admitting: Obstetrics & Gynecology

## 2014-06-29 DIAGNOSIS — Z9189 Other specified personal risk factors, not elsewhere classified: Secondary | ICD-10-CM

## 2015-04-17 DIAGNOSIS — K2100 Gastro-esophageal reflux disease with esophagitis, without bleeding: Secondary | ICD-10-CM | POA: Insufficient documentation

## 2015-04-17 DIAGNOSIS — K900 Celiac disease: Secondary | ICD-10-CM | POA: Insufficient documentation

## 2015-04-17 DIAGNOSIS — M3500 Sicca syndrome, unspecified: Secondary | ICD-10-CM | POA: Insufficient documentation

## 2015-06-26 ENCOUNTER — Other Ambulatory Visit: Payer: Self-pay | Admitting: Obstetrics & Gynecology

## 2015-06-26 DIAGNOSIS — Z803 Family history of malignant neoplasm of breast: Secondary | ICD-10-CM

## 2015-06-26 DIAGNOSIS — R922 Inconclusive mammogram: Secondary | ICD-10-CM

## 2015-06-26 DIAGNOSIS — R9230 Dense breasts, unspecified: Secondary | ICD-10-CM

## 2015-07-06 ENCOUNTER — Ambulatory Visit
Admission: RE | Admit: 2015-07-06 | Discharge: 2015-07-06 | Disposition: A | Discharge status: Home / Self Care | Payer: Medicare Other | Source: Ambulatory Visit | Attending: Obstetrics & Gynecology | Admitting: Obstetrics & Gynecology

## 2015-07-06 DIAGNOSIS — R922 Inconclusive mammogram: Secondary | ICD-10-CM

## 2015-07-06 DIAGNOSIS — Z803 Family history of malignant neoplasm of breast: Secondary | ICD-10-CM

## 2015-07-06 LAB — HM MAMMOGRAPHY: HM MAMMO: NORMAL (ref 0–4)

## 2015-07-06 MED ORDER — GADOBENATE DIMEGLUMINE 529 MG/ML IV SOLN
14.0000 mL | Freq: Once | INTRAVENOUS | Status: AC | PRN
  Administered 2015-07-06: 14 mL via INTRAVENOUS

## 2015-07-09 ENCOUNTER — Other Ambulatory Visit: Payer: Self-pay | Admitting: Physician Assistant

## 2015-07-09 ENCOUNTER — Ambulatory Visit
Admission: RE | Admit: 2015-07-09 | Discharge: 2015-07-09 | Disposition: A | Discharge status: Home / Self Care | Payer: Medicare Other | Source: Ambulatory Visit | Attending: Physician Assistant | Admitting: Physician Assistant

## 2015-07-09 DIAGNOSIS — R062 Wheezing: Secondary | ICD-10-CM | POA: Diagnosis not present

## 2015-07-09 DIAGNOSIS — R059 Cough, unspecified: Secondary | ICD-10-CM

## 2015-07-09 DIAGNOSIS — R05 Cough: Secondary | ICD-10-CM

## 2016-05-22 ENCOUNTER — Ambulatory Visit
Admission: RE | Admit: 2016-05-22 | Discharge: 2016-05-22 | Disposition: A | Discharge status: Home / Self Care | Payer: Medicare Other | Source: Ambulatory Visit | Attending: Internal Medicine | Admitting: Internal Medicine

## 2016-05-22 ENCOUNTER — Other Ambulatory Visit: Payer: Self-pay | Admitting: Internal Medicine

## 2016-05-22 DIAGNOSIS — J189 Pneumonia, unspecified organism: Secondary | ICD-10-CM

## 2016-05-22 DIAGNOSIS — J45909 Unspecified asthma, uncomplicated: Secondary | ICD-10-CM | POA: Insufficient documentation

## 2016-05-22 DIAGNOSIS — J158 Pneumonia due to other specified bacteria: Secondary | ICD-10-CM | POA: Insufficient documentation

## 2016-05-26 ENCOUNTER — Other Ambulatory Visit: Payer: Self-pay | Admitting: Physician Assistant

## 2016-05-26 DIAGNOSIS — R059 Cough, unspecified: Secondary | ICD-10-CM

## 2016-05-26 DIAGNOSIS — J158 Pneumonia due to other specified bacteria: Secondary | ICD-10-CM

## 2016-05-26 DIAGNOSIS — R05 Cough: Secondary | ICD-10-CM

## 2016-05-28 ENCOUNTER — Ambulatory Visit
Admission: RE | Admit: 2016-05-28 | Discharge: 2016-05-28 | Disposition: A | Discharge status: Home / Self Care | Payer: Medicare Other | Source: Ambulatory Visit | Attending: Physician Assistant | Admitting: Physician Assistant

## 2016-05-28 DIAGNOSIS — R918 Other nonspecific abnormal finding of lung field: Secondary | ICD-10-CM | POA: Insufficient documentation

## 2016-05-28 DIAGNOSIS — R059 Cough, unspecified: Secondary | ICD-10-CM

## 2016-05-28 DIAGNOSIS — R05 Cough: Secondary | ICD-10-CM | POA: Insufficient documentation

## 2016-05-28 LAB — POCT I-STAT CREATININE: Creatinine, Ser: 0.9 mg/dL (ref 0.44–1.00)

## 2016-05-28 MED ORDER — IOPAMIDOL (ISOVUE-300) INJECTION 61%
75.0000 mL | Freq: Once | INTRAVENOUS | Status: AC | PRN
  Administered 2016-05-28: 75 mL via INTRAVENOUS

## 2016-05-29 ENCOUNTER — Other Ambulatory Visit: Payer: Self-pay | Admitting: Internal Medicine

## 2016-05-29 DIAGNOSIS — R059 Cough, unspecified: Secondary | ICD-10-CM

## 2016-05-29 DIAGNOSIS — R05 Cough: Secondary | ICD-10-CM

## 2016-05-30 ENCOUNTER — Other Ambulatory Visit: Payer: Self-pay | Admitting: Internal Medicine

## 2016-06-04 ENCOUNTER — Ambulatory Visit
Admission: RE | Admit: 2016-06-04 | Discharge: 2016-06-04 | Disposition: A | Discharge status: Home / Self Care | Payer: Medicare Other | Source: Ambulatory Visit | Attending: Internal Medicine | Admitting: Internal Medicine

## 2016-06-04 ENCOUNTER — Encounter
Admission: RE | Disposition: A | Discharge status: Home / Self Care | Payer: Self-pay | Source: Ambulatory Visit | Attending: Internal Medicine

## 2016-06-04 DIAGNOSIS — K900 Celiac disease: Secondary | ICD-10-CM | POA: Insufficient documentation

## 2016-06-04 DIAGNOSIS — E785 Hyperlipidemia, unspecified: Secondary | ICD-10-CM | POA: Insufficient documentation

## 2016-06-04 DIAGNOSIS — M3500 Sicca syndrome, unspecified: Secondary | ICD-10-CM | POA: Diagnosis not present

## 2016-06-04 DIAGNOSIS — R918 Other nonspecific abnormal finding of lung field: Secondary | ICD-10-CM | POA: Diagnosis not present

## 2016-06-04 DIAGNOSIS — K219 Gastro-esophageal reflux disease without esophagitis: Secondary | ICD-10-CM | POA: Insufficient documentation

## 2016-06-04 DIAGNOSIS — Z90710 Acquired absence of both cervix and uterus: Secondary | ICD-10-CM | POA: Diagnosis not present

## 2016-06-04 DIAGNOSIS — Z9089 Acquired absence of other organs: Secondary | ICD-10-CM | POA: Insufficient documentation

## 2016-06-04 DIAGNOSIS — Z79899 Other long term (current) drug therapy: Secondary | ICD-10-CM | POA: Diagnosis not present

## 2016-06-04 DIAGNOSIS — Z791 Long term (current) use of non-steroidal anti-inflammatories (NSAID): Secondary | ICD-10-CM | POA: Insufficient documentation

## 2016-06-04 DIAGNOSIS — Z803 Family history of malignant neoplasm of breast: Secondary | ICD-10-CM | POA: Diagnosis not present

## 2016-06-04 DIAGNOSIS — Z9102 Food additives allergy status: Secondary | ICD-10-CM | POA: Insufficient documentation

## 2016-06-04 DIAGNOSIS — F419 Anxiety disorder, unspecified: Secondary | ICD-10-CM | POA: Diagnosis not present

## 2016-06-04 DIAGNOSIS — Z886 Allergy status to analgesic agent status: Secondary | ICD-10-CM | POA: Insufficient documentation

## 2016-06-04 DIAGNOSIS — M1990 Unspecified osteoarthritis, unspecified site: Secondary | ICD-10-CM | POA: Insufficient documentation

## 2016-06-04 DIAGNOSIS — Z8261 Family history of arthritis: Secondary | ICD-10-CM | POA: Insufficient documentation

## 2016-06-04 DIAGNOSIS — F329 Major depressive disorder, single episode, unspecified: Secondary | ICD-10-CM | POA: Diagnosis not present

## 2016-06-04 DIAGNOSIS — Z801 Family history of malignant neoplasm of trachea, bronchus and lung: Secondary | ICD-10-CM | POA: Insufficient documentation

## 2016-06-04 DIAGNOSIS — Z9049 Acquired absence of other specified parts of digestive tract: Secondary | ICD-10-CM | POA: Insufficient documentation

## 2016-06-04 DIAGNOSIS — E890 Postprocedural hypothyroidism: Secondary | ICD-10-CM | POA: Diagnosis not present

## 2016-06-04 DIAGNOSIS — Z888 Allergy status to other drugs, medicaments and biological substances status: Secondary | ICD-10-CM | POA: Diagnosis not present

## 2016-06-04 DIAGNOSIS — M797 Fibromyalgia: Secondary | ICD-10-CM | POA: Diagnosis not present

## 2016-06-04 DIAGNOSIS — Z9889 Other specified postprocedural states: Secondary | ICD-10-CM | POA: Insufficient documentation

## 2016-06-04 DIAGNOSIS — Z85850 Personal history of malignant neoplasm of thyroid: Secondary | ICD-10-CM | POA: Insufficient documentation

## 2016-06-04 DIAGNOSIS — Z7951 Long term (current) use of inhaled steroids: Secondary | ICD-10-CM | POA: Insufficient documentation

## 2016-06-04 DIAGNOSIS — K589 Irritable bowel syndrome without diarrhea: Secondary | ICD-10-CM | POA: Insufficient documentation

## 2016-06-04 DIAGNOSIS — Z881 Allergy status to other antibiotic agents status: Secondary | ICD-10-CM | POA: Diagnosis not present

## 2016-06-04 HISTORY — DX: Other complications of anesthesia, initial encounter: T88.59XA

## 2016-06-04 HISTORY — DX: Adverse effect of unspecified anesthetic, initial encounter: T41.45XA

## 2016-06-04 HISTORY — PX: FLEXIBLE BRONCHOSCOPY: SHX5094

## 2016-06-04 LAB — GRAM STAIN

## 2016-06-04 SURGERY — BRONCHOSCOPY, FLEXIBLE
Anesthesia: Moderate Sedation

## 2016-06-04 MED ORDER — BUTAMBEN-TETRACAINE-BENZOCAINE 2-2-14 % EX AERO
INHALATION_SPRAY | CUTANEOUS | Status: AC
  Filled 2016-06-04: qty 20

## 2016-06-04 MED ORDER — SODIUM CHLORIDE 0.9 % IV SOLN
INTRAVENOUS | Status: DC
  Administered 2016-06-04: 14:00:00 via INTRAVENOUS

## 2016-06-04 MED ORDER — PHENYLEPHRINE HCL 0.25 % NA SOLN
1.0000 | Freq: Four times a day (QID) | NASAL | Status: DC | PRN
  Filled 2016-06-04: qty 15

## 2016-06-04 MED ORDER — FENTANYL CITRATE (PF) 100 MCG/2ML IJ SOLN
INTRAMUSCULAR | Status: AC | PRN
  Administered 2016-06-04 (×3): 50 ug via INTRAVENOUS

## 2016-06-04 MED ORDER — FENTANYL CITRATE (PF) 100 MCG/2ML IJ SOLN
INTRAMUSCULAR | Status: AC
  Filled 2016-06-04: qty 4

## 2016-06-04 MED ORDER — MIDAZOLAM HCL 2 MG/2ML IJ SOLN
INTRAMUSCULAR | Status: AC | PRN
  Administered 2016-06-04: 2 mg via INTRAVENOUS

## 2016-06-04 MED ORDER — MIDAZOLAM HCL 5 MG/5ML IJ SOLN
INTRAMUSCULAR | Status: AC | PRN
  Administered 2016-06-04 (×3): 1 mg via INTRAVENOUS

## 2016-06-04 MED ORDER — MIDAZOLAM HCL 5 MG/5ML IJ SOLN
INTRAMUSCULAR | Status: AC
  Filled 2016-06-04: qty 5

## 2016-06-04 MED ORDER — LIDOCAINE HCL 2 % EX GEL: 1.0000 "application " | Freq: Once | CUTANEOUS | Status: DC

## 2016-06-04 MED ORDER — BUTAMBEN-TETRACAINE-BENZOCAINE 2-2-14 % EX AERO
1.0000 | INHALATION_SPRAY | Freq: Once | CUTANEOUS | Status: DC
  Filled 2016-06-04: qty 20

## 2016-06-04 NOTE — Op Note (Signed)
[facility] Patient Name: Susan [name] Procedure Date: 06/04/2016 2:10 PM MRN: [id number] Account #: [id number] Date of Birth: [birth year] Admit Type: Outpatient Age: 60 Room: [facility] PROCEDURE RM 01 Gender: Female Note Status: Finalized Attending MD: Allyne Gee , MD Procedure:         Bronchoscopy Indications:       Unresolving right lower lobe infiltrate Providers:         Allyne Gee, MD Referring MD:       Medicines:         Midazolam 5 mg IV, Fentanyl 952 mcg IV Complications:     No immediate complications Procedure:         Pre-Anesthesia Assessment:                    - A History and Physical has been performed. The patient's                     medications, allergies and sensitivities have been                     reviewed.                    - The risks and benefits of the procedure and the sedation                     options and risks were discussed with the patient. All                     questions were answered and informed consent was obtained.                    - Pre-procedure physical examination revealed no                     contraindications to sedation.                    - ASA Grade Assessment: III - A patient with severe                     systemic disease.                    - After reviewing the risks and benefits, the patient was                     deemed in satisfactory condition to undergo the procedure.                    - The anesthesia plan was to use moderate                     sedation/analgesia.                    - Immediately prior to administration of medications, the                     patient was re-assessed for adequacy to receive sedatives.                    - The heart rate, respiratory rate, oxygen saturations,                     blood pressure, adequacy of pulmonary ventilation, and  response to care were monitored throughout the procedure.                    - The physical status of the  patient was re-assessed after                     the procedure.                    After obtaining informed consent, the bronchoscope was                     passed under direct vision. Throughout the procedure, the                     patient's blood pressure, pulse, and oxygen saturations                     were monitored continuously. the Bronchoscope Olympus                     BF-Q180 S# [id number] was introduced through the right                     nostril and advanced to the tracheobronchial tree of both                     lungs. The procedure was accomplished without difficulty.                     The patient tolerated the procedure well. The total                     duration of the procedure was 22 minutes. Findings:      The nasopharynx/oropharynx appears normal. The larynx appears normal.       The vocal cords appear normal. The subglottic space is normal. The       trachea is of normal caliber. The carina is sharp. The tracheobronchial       tree was examined to at least the first subsegmental level. Bronchial       mucosa and anatomy are normal; there are no endobronchial lesions, and       no secretions. Notable, grey, thick secretions were found throughout the       tracheobronchial tree. They were not obstructing the airway. Washings       were obtained in the anterior basal segment of the right lower lobe, in       the lateral basal segment of the right lower lobe and in the posterior       basal segment of the right lower lobe and sent for cell count, bacterial       culture, viral smears & culture, and fungal & AFB analysis. The return       was cloudy. Impression:        - Unresolving right lower lobe infiltrate                    - The examination was normal.                    - No specimens collected.                    - The examination was normal.                    -  The examination was normal. Recommendation:    - Await BAL results.                    -  Await culture and washing results. Devona Konig, MD Allyne Gee, MD 06/04/2016 2:53:27 PM This report has been signed electronically. Number of Addenda: 0 Note Initiated On: 06/04/2016 2:10 PM      [facility]

## 2016-06-04 NOTE — H&P (Signed)
Pulmonary Critical Care  Initial Consult Note   Susan [name] DXA:[id number] DOB: [birth year] DOA: 06/04/2016  Referring physician: Emmie Niemann, MD PCP: Lenard Simmer, MD   Chief Complaint: Abnormal CT  HPI: Susan [name] is a 60 y.o. female with history of fibromyalgia asthma presented to the office with SOB. CT scan was done and revealed abnormality concerning for infectious process such as MAI. We discussed options and she was agreeable to bronchoscopy. Risks and benefits have been discussed at length with the patient   Review of Systems:  ROS performed and is unremarkable other than noted in HPI  Past Medical History:  Diagnosis Date  . Arthritis   . Asthmatic bronchitis   . Celiac sprue   . Chronic anxiety   . Complication of anesthesia    vomiting  . Depression   . Fibromyalgia   . Gastritis 10/08/2013  . GERD (gastroesophageal reflux disease)   . Hyperlipemia   . Hyperlipidemia    Intolerant to statins  . Hypothyroidism (acquired)   . Migraine   . 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 Pancreatitis   . Sjogren's disease ([place])   . Spastic colon   . Thyroid cancer ([place]) 1990 and 1994   Total thyroidectomy with radioactive iodine.    Past Surgical History:  Procedure Laterality Date  . ABDOMINAL HYSTERECTOMY    . BLADDER REPAIR    . CHOLECYSTECTOMY    . ESOPHAGOGASTRODUODENOSCOPY N/A 10/07/2013   Procedure: ESOPHAGOGASTRODUODENOSCOPY (EGD);  Surgeon: Danie Binder, MD;  Location: [facility];  Service: Endoscopy;  Laterality: N/A;  patient received heparin at 530am given phenergan 64m IV 30 minutes before  . NASAL SINUS SURGERY    . THYROID LOBECTOMY     Social History:  reports that she has never smoked. She has never used smokeless tobacco. She reports that she does not drink alcohol or use drugs.  Allergies  Allergen Reactions  . Aspirin   . Keflex [Cephalexin] Shortness Of Breath  . Azithromycin Hives and Nausea Only  . Barley Grass   .  Clarithromycin Other (See Comments)  . Metoclopramide Other (See Comments)    TREMORS  . Oat Other (See Comments)  . Rye Grass Flower Pollen Extract [Gramineae Pollens]   . Vioxx [Rofecoxib] Diarrhea  . Wheat Extract   . Celebrex [Celecoxib] Hives  . Metoclopramide Hcl Anxiety    BODY TREMORS    Family History  Problem Relation Age of Onset  . Arthritis/Rheumatoid Mother     died in age 9035s . Breast cancer Mother   . Breast cancer Maternal Grandmother   . Breast cancer Maternal Aunt     X 2  . Lung cancer Maternal Aunt   . Pancreatitis Neg Hx   . Colon cancer Neg Hx     Prior to Admission medications   Medication Sig Start Date End Date Taking? Authorizing Provider  albuterol (PROVENTIL HFA;VENTOLIN HFA) 108 (90 BASE) MCG/ACT inhaler Inhale 2 puffs into the lungs every 6 (six) hours as needed for wheezing or shortness of breath. 10/16/13  Yes PThurnell Lose MD  albuterol (PROVENTIL) (2.5 MG/3ML) 0.083% nebulizer solution Take 2.5 mg by nebulization every 6 (six) hours as needed for wheezing or shortness of breath.   Yes Historical Provider, MD  ALPRAZolam (Duanne Moron 0.5 MG tablet Take 0.5 mg by mouth 4 (four) times daily as needed. FOR ANXIETY   Yes Historical Provider, MD  amitriptyline (ELAVIL) 25 MG tablet Take 25 mg by mouth at bedtime.  Yes Historical Provider, MD  carisoprodol (SOMA) 350 MG tablet Take 350 mg by mouth at bedtime. Patient states that she takes 1/2 tablet nightly   Yes Historical Provider, MD  DYMISTA 137-50 MCG/ACT SUSP Place 1 spray into both nostrils daily. 05/05/16  Yes Historical Provider, MD  eletriptan (RELPAX) 40 MG tablet Take 40 mg by mouth as needed for migraine. may repeat in 2 hours if necessary    Yes Historical Provider, MD  EPINEPHrine 0.3 mg/0.3 mL IJ SOAJ injection Inject 0.3 mg into the muscle once.   Yes Historical Provider, MD  estradiol (CLIMARA - DOSED IN MG/24 HR) 0.1 mg/24hr patch Place 1 patch onto the skin once a week. Monday   Yes  Historical Provider, MD  fluticasone (FLONASE) 50 MCG/ACT nasal spray Place 2 sprays into both nostrils daily.    Yes Historical Provider, MD  Fluticasone Furoate (ARNUITY ELLIPTA) 100 MCG/ACT AEPB Inhale 2 puffs into the lungs every morning.   Yes Historical Provider, MD  hyoscyamine (NULEV) 0.125 MG TBDP Place 0.125 mg under the tongue every 4 (four) hours as needed for bladder spasms.    Yes Historical Provider, MD  Lactobacillus (DIGESTIVE HEALTH PROBIOTIC PO) Take 1 tablet by mouth daily.   Yes Historical Provider, MD  levothyroxine (SYNTHROID, LEVOTHROID) 112 MCG tablet Take 112 mcg by mouth every morning.    Yes Historical Provider, MD  magnesium citrate SOLN Take 1 Bottle by mouth as needed for mild constipation.    Yes Historical Provider, MD  mometasone-formoterol (DULERA) 100-5 MCG/ACT AERO Inhale 2 puffs into the lungs 2 (two) times daily.   Yes Historical Provider, MD  montelukast (SINGULAIR) 10 MG tablet Take 1 tablet (10 mg total) by mouth daily. 10/16/13  Yes Thurnell Lose, MD  Multiple Vitamins-Minerals (ADULT GUMMY PO) Take 2 tablets by mouth daily.   Yes Historical Provider, MD  pantoprazole (PROTONIX) 40 MG tablet Take 40 mg by mouth daily.   Yes Historical Provider, MD  PRESCRIPTION MEDICATION 1 Syringe by Subdermal route once a week. Allergy shot   Yes Historical Provider, MD  sodium chloride (OCEAN) 0.65 % SOLN nasal spray Place 1 spray into both nostrils as needed for congestion.   Yes Historical Provider, MD  sodium chloride 0.9 % nebulizer solution Take 3 mLs by nebulization 3 (three) times daily.   Yes Historical Provider, MD  SUMAtriptan-naproxen (TREXIMET) 85-500 MG per tablet Take 1 tablet by mouth daily as needed for migraine.   Yes Historical Provider, MD  venlafaxine XR (EFFEXOR-XR) 150 MG 24 hr capsule Take 300 mg by mouth at bedtime.   Yes Historical Provider, MD  Vitamin D, Ergocalciferol, (DRISDOL) 50000 units CAPS capsule Take 50,000 Units by mouth every 7  (seven) days. Monday   Yes Historical Provider, MD   Physical Exam: Vitals:   06/04/16 1320  BP: 109/76  Pulse: 81  Resp: 16  Temp: 97.9 F (36.6 C)  TempSrc: Oral  SpO2: 97%  Weight: 79.8 kg (176 lb)  Height: 5' 5"  (1.651 m)    Wt Readings from Last 3 Encounters:  06/04/16 79.8 kg (176 lb)  10/14/13 78 kg (172 lb)  10/07/13 83.5 kg (184 lb)    General:  Appears calm and comfortable Eyes: PERRL, normal lids, irises & conjunctiva ENT: grossly normal hearing, lips & tongue Neck: no LAD, masses or thyromegaly Cardiovascular: RRR, no m/r/g. No LE edema. Respiratory: CTA bilaterally, no w/r/r. Normal respiratory effort. Abdomen: soft, nontender Skin: no rash or induration seen on limited exam  Musculoskeletal: grossly normal tone BUE/BLE Psychiatric: grossly normal mood and affect Neurologic: grossly non-focal.          Labs on Admission:  Basic Metabolic Panel:  Recent Labs Lab 05/28/16 1419  CREATININE 0.90   Liver Function Tests: No results for input(s): AST, ALT, ALKPHOS, BILITOT, PROT, ALBUMIN in the last 168 hours. No results for input(s): LIPASE, AMYLASE in the last 168 hours. No results for input(s): AMMONIA in the last 168 hours. CBC: No results for input(s): WBC, NEUTROABS, HGB, HCT, MCV, PLT in the last 168 hours. Cardiac Enzymes: No results for input(s): CKTOTAL, CKMB, CKMBINDEX, TROPONINI in the last 168 hours.  BNP (last 3 results) No results for input(s): BNP in the last 8760 hours.  ProBNP (last 3 results) No results for input(s): PROBNP in the last 8760 hours.  CBG: No results for input(s): GLUCAP in the last 168 hours.  Radiological Exams on Admission: No results found.  EKG: Independently reviewed.  Assessment/Plan Active Problems:   * No active hospital problems. *   1. Abnormal CT with inflitrate -will proceed with bronchoscopy -specimen collected will be sent for cultures including MAI        I have personally obtained  a history, examined the patient, evaluated laboratory and imaging results, formulated the assessment and plan and placed orders.  The Patient requires high complexity decision making for assessment and support.    Allyne Gee, MD [facility] Pulmonary Critical Care Medicine Sleep Medicine

## 2016-06-05 LAB — ACID FAST SMEAR (AFB): ACID FAST SMEAR - AFSCU2: NEGATIVE

## 2016-06-05 LAB — ACID FAST SMEAR (AFB, MYCOBACTERIA)

## 2016-06-06 ENCOUNTER — Ambulatory Visit
Admission: RE | Admit: 2016-06-06 | Discharge: 2016-06-06 | Disposition: A | Discharge status: Home / Self Care | Payer: Medicare Other | Source: Ambulatory Visit | Attending: Internal Medicine | Admitting: Internal Medicine

## 2016-06-06 DIAGNOSIS — R05 Cough: Secondary | ICD-10-CM | POA: Diagnosis present

## 2016-06-06 DIAGNOSIS — R059 Cough, unspecified: Secondary | ICD-10-CM

## 2016-06-17 ENCOUNTER — Other Ambulatory Visit: Payer: Self-pay | Admitting: Internal Medicine

## 2016-06-17 DIAGNOSIS — R05 Cough: Secondary | ICD-10-CM

## 2016-06-17 DIAGNOSIS — R059 Cough, unspecified: Secondary | ICD-10-CM

## 2016-06-20 ENCOUNTER — Ambulatory Visit
Admission: RE | Admit: 2016-06-20 | Discharge: 2016-06-20 | Disposition: A | Discharge status: Home / Self Care | Payer: Medicare Other | Source: Ambulatory Visit | Attending: Internal Medicine | Admitting: Internal Medicine

## 2016-06-20 DIAGNOSIS — J9811 Atelectasis: Secondary | ICD-10-CM | POA: Insufficient documentation

## 2016-06-20 DIAGNOSIS — R05 Cough: Secondary | ICD-10-CM

## 2016-06-20 DIAGNOSIS — R059 Cough, unspecified: Secondary | ICD-10-CM

## 2016-06-25 LAB — CULTURE, FUNGUS WITHOUT SMEAR

## 2016-07-18 LAB — ACID FAST CULTURE WITH REFLEXED SENSITIVITIES: ACID FAST CULTURE - AFSCU3: NEGATIVE

## 2016-08-08 ENCOUNTER — Other Ambulatory Visit: Payer: Self-pay | Admitting: Obstetrics & Gynecology

## 2016-08-28 ENCOUNTER — Other Ambulatory Visit: Payer: Self-pay | Admitting: Internal Medicine

## 2016-08-28 DIAGNOSIS — R0602 Shortness of breath: Secondary | ICD-10-CM

## 2016-08-28 DIAGNOSIS — J158 Pneumonia due to other specified bacteria: Secondary | ICD-10-CM

## 2016-09-03 ENCOUNTER — Ambulatory Visit
Admission: RE | Admit: 2016-09-03 | Discharge: 2016-09-03 | Disposition: A | Discharge status: Home / Self Care | Payer: Medicare Other | Source: Ambulatory Visit | Attending: Internal Medicine | Admitting: Internal Medicine

## 2016-09-03 DIAGNOSIS — R0602 Shortness of breath: Secondary | ICD-10-CM | POA: Insufficient documentation

## 2016-09-03 DIAGNOSIS — J158 Pneumonia due to other specified bacteria: Secondary | ICD-10-CM

## 2016-09-03 LAB — POCT I-STAT CREATININE: Creatinine, Ser: 1 mg/dL (ref 0.44–1.00)

## 2016-09-03 MED ORDER — IOPAMIDOL (ISOVUE-300) INJECTION 61%
75.0000 mL | Freq: Once | INTRAVENOUS | Status: AC | PRN
  Administered 2016-09-03: 75 mL via INTRAVENOUS

## 2016-11-06 DIAGNOSIS — K900 Celiac disease: Secondary | ICD-10-CM | POA: Diagnosis not present

## 2016-11-06 DIAGNOSIS — E785 Hyperlipidemia, unspecified: Secondary | ICD-10-CM | POA: Diagnosis not present

## 2016-11-06 DIAGNOSIS — M2669 Other specified disorders of temporomandibular joint: Secondary | ICD-10-CM | POA: Diagnosis not present

## 2016-11-06 DIAGNOSIS — E890 Postprocedural hypothyroidism: Secondary | ICD-10-CM | POA: Diagnosis not present

## 2016-11-06 DIAGNOSIS — K589 Irritable bowel syndrome without diarrhea: Secondary | ICD-10-CM | POA: Diagnosis not present

## 2016-11-06 DIAGNOSIS — M3500 Sicca syndrome, unspecified: Secondary | ICD-10-CM | POA: Diagnosis not present

## 2016-11-06 DIAGNOSIS — Z0000 Encounter for general adult medical examination without abnormal findings: Secondary | ICD-10-CM | POA: Diagnosis not present

## 2016-11-06 DIAGNOSIS — Z1211 Encounter for screening for malignant neoplasm of colon: Secondary | ICD-10-CM | POA: Diagnosis not present

## 2016-11-06 DIAGNOSIS — N8111 Cystocele, midline: Secondary | ICD-10-CM | POA: Diagnosis not present

## 2016-11-06 DIAGNOSIS — E039 Hypothyroidism, unspecified: Secondary | ICD-10-CM | POA: Diagnosis not present

## 2016-11-06 DIAGNOSIS — M6080 Other myositis, unspecified site: Secondary | ICD-10-CM | POA: Diagnosis not present

## 2016-11-10 DIAGNOSIS — K219 Gastro-esophageal reflux disease without esophagitis: Secondary | ICD-10-CM | POA: Diagnosis not present

## 2016-11-10 DIAGNOSIS — R0602 Shortness of breath: Secondary | ICD-10-CM | POA: Diagnosis not present

## 2016-11-10 DIAGNOSIS — J301 Allergic rhinitis due to pollen: Secondary | ICD-10-CM | POA: Diagnosis not present

## 2016-11-10 DIAGNOSIS — J45991 Cough variant asthma: Secondary | ICD-10-CM | POA: Diagnosis not present

## 2016-11-18 DIAGNOSIS — E785 Hyperlipidemia, unspecified: Secondary | ICD-10-CM | POA: Diagnosis not present

## 2016-11-18 DIAGNOSIS — C73 Malignant neoplasm of thyroid gland: Secondary | ICD-10-CM | POA: Diagnosis not present

## 2016-11-18 DIAGNOSIS — E039 Hypothyroidism, unspecified: Secondary | ICD-10-CM | POA: Diagnosis not present

## 2016-11-20 DIAGNOSIS — K5730 Diverticulosis of large intestine without perforation or abscess without bleeding: Secondary | ICD-10-CM | POA: Diagnosis not present

## 2016-11-20 DIAGNOSIS — C73 Malignant neoplasm of thyroid gland: Secondary | ICD-10-CM | POA: Diagnosis not present

## 2016-11-20 DIAGNOSIS — K900 Celiac disease: Secondary | ICD-10-CM | POA: Diagnosis not present

## 2016-11-20 DIAGNOSIS — M6080 Other myositis, unspecified site: Secondary | ICD-10-CM | POA: Diagnosis not present

## 2016-11-20 DIAGNOSIS — K229 Disease of esophagus, unspecified: Secondary | ICD-10-CM | POA: Diagnosis not present

## 2016-11-20 DIAGNOSIS — E039 Hypothyroidism, unspecified: Secondary | ICD-10-CM | POA: Diagnosis not present

## 2016-11-20 DIAGNOSIS — M3500 Sicca syndrome, unspecified: Secondary | ICD-10-CM | POA: Diagnosis not present

## 2016-11-20 DIAGNOSIS — F411 Generalized anxiety disorder: Secondary | ICD-10-CM | POA: Diagnosis not present

## 2016-11-20 DIAGNOSIS — E785 Hyperlipidemia, unspecified: Secondary | ICD-10-CM | POA: Diagnosis not present

## 2016-11-20 DIAGNOSIS — F445 Conversion disorder with seizures or convulsions: Secondary | ICD-10-CM | POA: Diagnosis not present

## 2016-11-20 DIAGNOSIS — E559 Vitamin D deficiency, unspecified: Secondary | ICD-10-CM | POA: Diagnosis not present

## 2016-11-20 DIAGNOSIS — K589 Irritable bowel syndrome without diarrhea: Secondary | ICD-10-CM | POA: Diagnosis not present

## 2017-01-05 DIAGNOSIS — M79642 Pain in left hand: Secondary | ICD-10-CM | POA: Diagnosis not present

## 2017-01-05 DIAGNOSIS — M545 Low back pain, unspecified: Secondary | ICD-10-CM | POA: Insufficient documentation

## 2017-01-05 DIAGNOSIS — M3500 Sicca syndrome, unspecified: Secondary | ICD-10-CM | POA: Insufficient documentation

## 2017-01-05 DIAGNOSIS — G8929 Other chronic pain: Secondary | ICD-10-CM | POA: Diagnosis not present

## 2017-01-05 DIAGNOSIS — M2550 Pain in unspecified joint: Secondary | ICD-10-CM | POA: Diagnosis not present

## 2017-01-05 DIAGNOSIS — M79641 Pain in right hand: Secondary | ICD-10-CM | POA: Diagnosis not present

## 2017-01-05 DIAGNOSIS — K900 Celiac disease: Secondary | ICD-10-CM | POA: Diagnosis not present

## 2017-01-12 DIAGNOSIS — J301 Allergic rhinitis due to pollen: Secondary | ICD-10-CM | POA: Diagnosis not present

## 2017-01-19 DIAGNOSIS — J301 Allergic rhinitis due to pollen: Secondary | ICD-10-CM | POA: Diagnosis not present

## 2017-01-21 DIAGNOSIS — Z79899 Other long term (current) drug therapy: Secondary | ICD-10-CM | POA: Diagnosis not present

## 2017-01-21 DIAGNOSIS — K900 Celiac disease: Secondary | ICD-10-CM | POA: Diagnosis not present

## 2017-01-21 DIAGNOSIS — G43719 Chronic migraine without aura, intractable, without status migrainosus: Secondary | ICD-10-CM | POA: Diagnosis not present

## 2017-01-21 DIAGNOSIS — M545 Low back pain: Secondary | ICD-10-CM | POA: Diagnosis not present

## 2017-01-21 DIAGNOSIS — M797 Fibromyalgia: Secondary | ICD-10-CM | POA: Diagnosis not present

## 2017-01-21 DIAGNOSIS — M069 Rheumatoid arthritis, unspecified: Secondary | ICD-10-CM | POA: Diagnosis not present

## 2017-01-21 DIAGNOSIS — G250 Essential tremor: Secondary | ICD-10-CM | POA: Diagnosis not present

## 2017-01-21 DIAGNOSIS — E785 Hyperlipidemia, unspecified: Secondary | ICD-10-CM | POA: Diagnosis not present

## 2017-01-21 DIAGNOSIS — F329 Major depressive disorder, single episode, unspecified: Secondary | ICD-10-CM | POA: Diagnosis not present

## 2017-03-20 DIAGNOSIS — E785 Hyperlipidemia, unspecified: Secondary | ICD-10-CM | POA: Diagnosis not present

## 2017-03-20 DIAGNOSIS — C73 Malignant neoplasm of thyroid gland: Secondary | ICD-10-CM | POA: Diagnosis not present

## 2017-03-20 DIAGNOSIS — M6080 Other myositis, unspecified site: Secondary | ICD-10-CM | POA: Diagnosis not present

## 2017-03-20 DIAGNOSIS — E039 Hypothyroidism, unspecified: Secondary | ICD-10-CM | POA: Diagnosis not present

## 2017-03-27 DIAGNOSIS — E039 Hypothyroidism, unspecified: Secondary | ICD-10-CM | POA: Diagnosis not present

## 2017-03-27 DIAGNOSIS — E785 Hyperlipidemia, unspecified: Secondary | ICD-10-CM | POA: Diagnosis not present

## 2017-03-27 DIAGNOSIS — K900 Celiac disease: Secondary | ICD-10-CM | POA: Diagnosis not present

## 2017-03-27 DIAGNOSIS — J45998 Other asthma: Secondary | ICD-10-CM | POA: Diagnosis not present

## 2017-03-27 DIAGNOSIS — M7120 Synovial cyst of popliteal space [Baker], unspecified knee: Secondary | ICD-10-CM | POA: Diagnosis not present

## 2017-03-27 DIAGNOSIS — M5136 Other intervertebral disc degeneration, lumbar region: Secondary | ICD-10-CM | POA: Diagnosis not present

## 2017-03-27 DIAGNOSIS — F445 Conversion disorder with seizures or convulsions: Secondary | ICD-10-CM | POA: Diagnosis not present

## 2017-03-27 DIAGNOSIS — K589 Irritable bowel syndrome without diarrhea: Secondary | ICD-10-CM | POA: Diagnosis not present

## 2017-03-27 DIAGNOSIS — K5730 Diverticulosis of large intestine without perforation or abscess without bleeding: Secondary | ICD-10-CM | POA: Diagnosis not present

## 2017-03-27 DIAGNOSIS — F411 Generalized anxiety disorder: Secondary | ICD-10-CM | POA: Diagnosis not present

## 2017-03-27 DIAGNOSIS — C73 Malignant neoplasm of thyroid gland: Secondary | ICD-10-CM | POA: Diagnosis not present

## 2017-03-27 DIAGNOSIS — M3500 Sicca syndrome, unspecified: Secondary | ICD-10-CM | POA: Diagnosis not present

## 2017-05-26 DIAGNOSIS — R0602 Shortness of breath: Secondary | ICD-10-CM | POA: Diagnosis not present

## 2017-05-26 DIAGNOSIS — J449 Chronic obstructive pulmonary disease, unspecified: Secondary | ICD-10-CM | POA: Diagnosis not present

## 2017-05-26 DIAGNOSIS — J309 Allergic rhinitis, unspecified: Secondary | ICD-10-CM | POA: Diagnosis not present

## 2017-06-03 ENCOUNTER — Encounter (HOSPITAL_COMMUNITY): Payer: Self-pay | Admitting: Emergency Medicine

## 2017-06-03 ENCOUNTER — Emergency Department (HOSPITAL_COMMUNITY)
Admission: EM | Admit: 2017-06-03 | Discharge: 2017-06-03 | Disposition: A | Discharge status: Home / Self Care | Payer: Medicare Other | Attending: Emergency Medicine | Admitting: Emergency Medicine

## 2017-06-03 ENCOUNTER — Emergency Department (HOSPITAL_COMMUNITY): Payer: Medicare Other

## 2017-06-03 DIAGNOSIS — R300 Dysuria: Secondary | ICD-10-CM | POA: Insufficient documentation

## 2017-06-03 DIAGNOSIS — M549 Dorsalgia, unspecified: Secondary | ICD-10-CM | POA: Insufficient documentation

## 2017-06-03 DIAGNOSIS — Z79899 Other long term (current) drug therapy: Secondary | ICD-10-CM | POA: Insufficient documentation

## 2017-06-03 DIAGNOSIS — R002 Palpitations: Secondary | ICD-10-CM

## 2017-06-03 DIAGNOSIS — R140 Abdominal distension (gaseous): Secondary | ICD-10-CM | POA: Insufficient documentation

## 2017-06-03 DIAGNOSIS — K5909 Other constipation: Secondary | ICD-10-CM | POA: Diagnosis not present

## 2017-06-03 DIAGNOSIS — R110 Nausea: Secondary | ICD-10-CM

## 2017-06-03 DIAGNOSIS — K5900 Constipation, unspecified: Secondary | ICD-10-CM | POA: Diagnosis not present

## 2017-06-03 LAB — BASIC METABOLIC PANEL
ANION GAP: 10 (ref 5–15)
BUN: 14 mg/dL (ref 6–20)
CALCIUM: 9.1 mg/dL (ref 8.9–10.3)
CO2: 22 mmol/L (ref 22–32)
Chloride: 105 mmol/L (ref 101–111)
Creatinine, Ser: 0.86 mg/dL (ref 0.44–1.00)
GFR calc Af Amer: >60 mL/min (ref >60)
GLUCOSE: 106 mg/dL — AB (ref 65–99)
Potassium: 3.4 mmol/L — ABNORMAL LOW (ref 3.5–5.1)
SODIUM: 137 mmol/L (ref 135–145)

## 2017-06-03 LAB — CBC WITH DIFFERENTIAL/PLATELET
BASOS ABS: 0 10*3/uL (ref 0.0–0.1)
Basophils Relative: 0 %
EOS ABS: 0.2 10*3/uL (ref 0.0–0.7)
EOS PCT: 3 %
HCT: 41 % (ref 36.0–46.0)
Hemoglobin: 13.6 g/dL (ref 12.0–15.0)
Lymphocytes Relative: 36 %
Lymphs Abs: 2.8 10*3/uL (ref 0.7–4.0)
MCH: 29.7 pg (ref 26.0–34.0)
MCHC: 33.2 g/dL (ref 30.0–36.0)
MCV: 89.5 fL (ref 78.0–100.0)
MONO ABS: 0.6 10*3/uL (ref 0.1–1.0)
Monocytes Relative: 8 %
Neutro Abs: 4.1 10*3/uL (ref 1.7–7.7)
Neutrophils Relative %: 53 %
PLATELETS: 316 10*3/uL (ref 150–400)
RBC: 4.58 MIL/uL (ref 3.87–5.11)
RDW: 13.9 % (ref 11.5–15.5)
WBC: 7.7 10*3/uL (ref 4.0–10.5)

## 2017-06-03 LAB — URINALYSIS, ROUTINE W REFLEX MICROSCOPIC
BILIRUBIN URINE: NEGATIVE
Glucose, UA: NEGATIVE mg/dL
HGB URINE DIPSTICK: NEGATIVE
Ketones, ur: NEGATIVE mg/dL
Leukocytes, UA: NEGATIVE
NITRITE: NEGATIVE
PROTEIN: NEGATIVE mg/dL
SPECIFIC GRAVITY, URINE: 1.004 — AB (ref 1.005–1.030)
pH: 6 (ref 5.0–8.0)

## 2017-06-03 LAB — I-STAT CHEM 8, ED
BUN: 13 mg/dL (ref 6–20)
CALCIUM ION: 1.13 mmol/L — AB (ref 1.15–1.40)
CREATININE: 0.8 mg/dL (ref 0.44–1.00)
Chloride: 105 mmol/L (ref 101–111)
GLUCOSE: 109 mg/dL — AB (ref 65–99)
HCT: 41 % (ref 36.0–46.0)
HEMOGLOBIN: 13.9 g/dL (ref 12.0–15.0)
POTASSIUM: 3.6 mmol/L (ref 3.5–5.1)
Sodium: 141 mmol/L (ref 135–145)
TCO2: 23 mmol/L (ref 0–100)

## 2017-06-03 LAB — TROPONIN I: Troponin I: <0.03 ng/mL (ref <0.03)

## 2017-06-03 MED ORDER — MILK AND MOLASSES ENEMA
1.0000 | Freq: Once | RECTAL | Status: DC
  Filled 2017-06-03: qty 250

## 2017-06-03 MED ORDER — SODIUM CHLORIDE 0.9 % IV BOLUS (SEPSIS)
1000.0000 mL | Freq: Once | INTRAVENOUS | Status: AC
  Administered 2017-06-03: 1000 mL via INTRAVENOUS

## 2017-06-03 MED ORDER — ONDANSETRON 4 MG PO TBDP: 4.0000 mg | ORAL_TABLET | Freq: Three times a day (TID) | ORAL | 0 refills | Status: DC | PRN

## 2017-06-03 NOTE — ED Provider Notes (Signed)
[facility] DEPT Provider Note   CSN: [id number] Arrival date & time: 06/03/17  0135     History   Chief Complaint Chief Complaint  Patient presents with  . Constipation  . Nausea    HPI Susan [name] is a 61 y.o. female.  HPI  This is a 61 year old female with a history of celiac disease, Sjogren's, hyperlipidemia who presents with nausea and abdominal bloating. Patient reports history of chronic constipation. She reports that she utilizes enemas approximately twice weekly. She reports that on Sunday she used to warm water enemas with some success. Today she used 2 Fleet's enemas and was able to have some small bowel movements. However, this evening after eating dinner she became more bloated. She denies abdominal pain. She reports some nausea which she has had in the past but no vomiting. She states that she started to feel jittery. She denies fevers, chest pain, shortness of breath. She does report some dysuria. She reports chronic back pain. No new back pain.  Past Medical History:  Diagnosis Date  . Arthritis   . Asthmatic bronchitis   . Celiac sprue   . Chronic anxiety   . Complication of anesthesia    vomiting  . Depression   . Fibromyalgia   . Gastritis 10/08/2013  . GERD (gastroesophageal reflux disease)   . Hyperlipemia   . Hyperlipidemia    Intolerant to statins  . Hypothyroidism (acquired)   . Migraine   . 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 Pancreatitis   . Sjogren's disease ([place])   . Spastic colon   . Thyroid cancer ([place]) 1990 and 1994   Total thyroidectomy with radioactive iodine.     Patient Active Problem List   Diagnosis Date Noted  . Acute asthma exacerbation 10/14/2013  . Acute respiratory failure ([place]) 10/14/2013  . Gastritis 10/08/2013  . Bradycardia 10/07/2013  . Esophageal spasm 10/06/2013  . Substernal precordial chest pain 10/06/2013  . Chronic anxiety 10/06/2013  . GERD (gastroesophageal reflux disease) 10/06/2013  . Asthmatic  bronchitis 10/06/2013  . 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 Depression with anxiety 10/06/2013  . Fibromyalgia 10/06/2013  . Hypothyroidism (acquired) 10/06/2013  . Chronic migraine 10/06/2013    Past Surgical History:  Procedure Laterality Date  . ABDOMINAL HYSTERECTOMY    . BLADDER REPAIR    . CHOLECYSTECTOMY    . ESOPHAGOGASTRODUODENOSCOPY N/A 10/07/2013   Procedure: ESOPHAGOGASTRODUODENOSCOPY (EGD);  Surgeon: Danie Binder, MD;  Location: [facility];  Service: Endoscopy;  Laterality: N/A;  patient received heparin at 530am given phenergan 58m IV 30 minutes before  . FLEXIBLE BRONCHOSCOPY N/A 06/04/2016   Procedure: FLEXIBLE BRONCHOSCOPY;  Surgeon: SAllyne Gee MD;  Location: [facility] ORS;  Service: Pulmonary;  Laterality: N/A;  . NASAL SINUS SURGERY    . THYROID LOBECTOMY      OB History    No data available       Home Medications    Prior to Admission medications   Medication Sig Start Date End Date Taking? Authorizing Provider  albuterol (PROVENTIL HFA;VENTOLIN HFA) 108 (90 BASE) MCG/ACT inhaler Inhale 2 puffs into the lungs every 6 (six) hours as needed for wheezing or shortness of breath. 10/16/13  Yes SThurnell Lose MD  albuterol (PROVENTIL) (2.5 MG/3ML) 0.083% nebulizer solution Take 2.5 mg by nebulization every 6 (six) hours as needed for wheezing or shortness of breath.   Yes [provider]  ALPRAZolam (Duanne Moron 0.5 MG tablet Take 0.5 mg by mouth 4 (four) times daily as needed.  FOR ANXIETY   Yes [provider]  amitriptyline (ELAVIL) 25 MG tablet Take 25 mg by mouth at bedtime.   Yes [provider]  DYMISTA 137-50 MCG/ACT SUSP Place 1 spray into both nostrils daily. 05/05/16  Yes [provider]  eletriptan (RELPAX) 40 MG tablet Take 40 mg by mouth as needed for migraine. may repeat in 2 hours if necessary    Yes [provider]  EPINEPHrine 0.3 mg/0.3 mL IJ SOAJ injection Inject 0.3 mg into the muscle once.   Yes  [provider]  hydroxychloroquine (PLAQUENIL) 200 MG tablet Take by mouth 2 (two) times daily.   Yes [provider]  hyoscyamine (NULEV) 0.125 MG TBDP Place 0.125 mg under the tongue every 4 (four) hours as needed for bladder spasms.    Yes [provider]  Lactobacillus (DIGESTIVE HEALTH PROBIOTIC PO) Take 1 tablet by mouth daily.   Yes [provider]  levothyroxine (SYNTHROID, LEVOTHROID) 112 MCG tablet Take 112 mcg by mouth every morning.    Yes [provider]  magnesium citrate SOLN Take 1 Bottle by mouth as needed for mild constipation.    Yes [provider]  mometasone-formoterol (DULERA) 100-5 MCG/ACT AERO Inhale 2 puffs into the lungs 2 (two) times daily.   Yes [provider]  montelukast (SINGULAIR) 10 MG tablet Take 1 tablet (10 mg total) by mouth daily. 10/16/13  Yes Thurnell Lose, MD  Multiple Vitamins-Minerals (ADULT GUMMY PO) Take 2 tablets by mouth daily.   Yes [provider]  pantoprazole (PROTONIX) 40 MG tablet Take 40 mg by mouth daily.   Yes [provider]  PRESCRIPTION MEDICATION 1 Syringe by Subdermal route once a week. Allergy shot   Yes [provider]  sodium chloride (OCEAN) 0.65 % SOLN nasal spray Place 1 spray into both nostrils as needed for congestion.   Yes [provider]  sodium chloride 0.9 % nebulizer solution Take 3 mLs by nebulization 3 (three) times daily.   Yes [provider]  SUMAtriptan-naproxen (TREXIMET) 85-500 MG per tablet Take 1 tablet by mouth daily as needed for migraine.   Yes [provider]  venlafaxine XR (EFFEXOR-XR) 150 MG 24 hr capsule Take 300 mg by mouth at bedtime.   Yes [provider]  Vitamin D, Ergocalciferol, (DRISDOL) 50000 units CAPS capsule Take 50,000 Units by mouth every 7 (seven) days. Monday   Yes [provider]  carisoprodol (SOMA) 350 MG tablet Take 350 mg by mouth at bedtime.  Patient states that she takes 1/2 tablet nightly    [provider]  estradiol (CLIMARA - DOSED IN MG/24 HR) 0.1 mg/24hr patch Place 1 patch onto the skin once a week. Monday    [provider]  fluticasone (FLONASE) 50 MCG/ACT nasal spray Place 2 sprays into both nostrils daily.     [provider]  Fluticasone Furoate (ARNUITY ELLIPTA) 100 MCG/ACT AEPB Inhale 2 puffs into the lungs every morning.    [provider]    Family History Family History  Problem Relation Age of Onset  . Arthritis/Rheumatoid Mother        died in age 45s  . Breast cancer Mother   . Breast cancer Maternal Grandmother   . Breast cancer Maternal Aunt        X 2  . Lung cancer Maternal Aunt   . Pancreatitis Neg Hx   . Colon cancer Neg Hx     Social History Social History  Substance Use Topics  . Smoking status: Never Smoker  . Smokeless tobacco: Never Used  . Alcohol use No     Allergies   Aspirin; Keflex [cephalexin]; Azithromycin; Barley grass; Clarithromycin; Metoclopramide; Oat; Rye grass flower pollen extract [gramineae pollens]; Vioxx [rofecoxib]; Wheat extract; Celebrex [celecoxib]; and Metoclopramide hcl   Review of Systems Review of Systems  Constitutional: Negative for fever.  Respiratory: Negative for shortness of breath.   Cardiovascular: Negative for chest pain.  Gastrointestinal: Positive for abdominal distention, constipation and nausea. Negative for abdominal pain, blood in stool, diarrhea and vomiting.  Genitourinary: Positive for dysuria.  Musculoskeletal: Positive for back pain.  All other systems reviewed and are negative.    Physical Exam Updated Vital Signs BP (!) 130/91 (BP Location: Left Arm)   Pulse 75   Temp 97.9 F (36.6 C) (Oral)   Resp 18   Ht 5' 5"  (1.651 m)   Wt 83.5 kg (184 lb)   SpO2 96%   BMI 30.62 kg/m   Physical Exam  Constitutional: She is oriented to person, place, and time. She appears well-developed and  well-nourished.  Anxious appearing, no acute distress  HENT:  Head: Normocephalic and atraumatic.  Mucous membranes moist  Cardiovascular: Normal rate, regular rhythm and normal heart sounds.   Pulmonary/Chest: Effort normal and breath sounds normal. No respiratory distress. She has no wheezes.  Abdominal: Soft. Bowel sounds are normal. There is no tenderness. There is no rebound and no guarding.  Neurological: She is alert and oriented to person, place, and time.  Skin: Skin is warm and dry.  Psychiatric: She has a normal mood and affect.  Nursing note and vitals reviewed.    ED Treatments / Results  Labs (all labs ordered are listed, but only abnormal results are displayed) Labs Reviewed  URINALYSIS, ROUTINE W REFLEX MICROSCOPIC - Abnormal; Notable for the following:       Result Value   Color, Urine STRAW (*)    Specific Gravity, Urine 1.004 (*)    All other components within normal limits  BASIC METABOLIC PANEL - Abnormal; Notable for the following:    Potassium 3.4 (*)    Glucose, Bld 106 (*)    All other components within normal limits  I-STAT CHEM 8, ED - Abnormal; Notable for the following:    Glucose, Bld 109 (*)    Calcium, Ion 1.13 (*)    All other components within normal limits  CBC WITH DIFFERENTIAL/PLATELET  TROPONIN I    EKG  EKG Interpretation  Date/Time:  Wednesday June 03 2017 04:49:59 EDT Ventricular Rate:  69 PR Interval:    QRS Duration: 115 QT Interval:  417 QTC Calculation: 447 R Axis:   76 Text Interpretation:  Sinus rhythm Incomplete right bundle branch block Low voltage, precordial leads No significant change since last tracing Confirmed by Thayer Jew [phone]) on 06/03/2017 5:00:29 AM       Radiology Dg Abdomen 1 View  Result Date: 06/03/2017 CLINICAL DATA:  Acute onset of constipation.  Initial encounter. EXAM: ABDOMEN - 1 VIEW COMPARISON:  CTA of the abdomen and pelvis performed 10/06/2013 FINDINGS: The visualized bowel gas  pattern is unremarkable. Scattered air and stool filled loops of colon are seen; no abnormal dilatation of small bowel loops is seen to suggest small bowel obstruction. No free intra-abdominal air is identified, though evaluation for free air is limited on a single supine view. Clips are noted within the right upper quadrant, reflecting prior cholecystectomy. The visualized osseous structures are  within normal limits; the sacroiliac joints are unremarkable in appearance. The visualized lung bases are essentially clear. IMPRESSION: Unremarkable bowel gas pattern; no free intra-abdominal air seen. Small to moderate amount of stool noted in the colon. No radiographic evidence of constipation. Electronically Signed   By: Garald Balding M.D.   On: 06/03/2017 02:46    Procedures Procedures (including critical care time)  Medications Ordered in ED Medications  milk and molasses enema (not administered)  sodium chloride 0.9 % bolus 1,000 mL (not administered)     Initial Impression / Assessment and Plan / ED Course  I have reviewed the triage vital signs and the nursing notes.  Pertinent labs & imaging results that were available during my care of the patient were reviewed by me and considered in my medical decision making (see chart for details).     4:15 AM Informed patient of KUB results and urinalysis. Milk of molasses enema was ordered. Patient reports that she feels shaky and like her muscles are spasming. Chem-8 was ordered to evaluate for metabolites.  4:47 AM Informed by nursing that patient is requesting to speak with me. Patient and her husband seemed very frustrated. Patient states "I do not think I can take an enema that is why came." I tried to discuss with the patient what she feels would be helpful for her. She then states that she came because she felt palpitations. She is now reporting that she had palpitations at home and like her heart was racing. She states that she feels  dehydrated. She had none of these complaints on my initial history. Husband got very upset as I tried to explain to the patient that I wanted to address her issues but could not address that which I did not know was a problem. I suggested a reset. On additional history taking, patient states that she had no chest pain or shortness of breath. However, she felt palpitations. Heart rate upon arrival was in the 70s. She reports that she reported this to nursing and thought I knew.  Again I encouraged patient to advocate for herself and make sure that she provides a full history. I have added additional tests to include EKG, troponin, CBC, BMP. Patient was given fluids given her concerns for dehydration. However, her vital signs remained stable and her abdominal exam remains benign.  6:35 AM Patient given fluids. Lab work reviewed and largely unremarkable. On recheck, she states that she feels "somewhat better." EKG and troponin are reassuring. Doubt ACS. Heart rate has been in the 60s without evidence of arrhythmia. She does not appear obstructed. She was offered an enema but she declined. Abdomen remains soft.  Follow-up with primary physician and GI.  After history, exam, and medical workup I feel the patient has been appropriately medically screened and is safe for discharge home. Pertinent diagnoses were discussed with the patient. Patient was given return precautions.   Final Clinical Impressions(s) / ED Diagnoses   Final diagnoses:  Other constipation  Nausea  Palpitations    New Prescriptions New Prescriptions   No medications on file     Merryl Hacker, MD 06/03/17 [phone]

## 2017-06-03 NOTE — Discharge Instructions (Signed)
You were seen today for ongoing constipation, palpitations, and nausea. Your workup is largely reassuring. You have a moderate stool burden without obstruction on your x-ray. Your lab work is all normal. No signs or symptoms of significant dehydration. Your EKG is reassuring at this time.  You elected to take an enema at home. If you have any new or worsening symptoms you should be reevaluated. Follow-up with your primary physician and GI doctor.  Thank you for choosing [facility] for your treatment today.

## 2017-06-03 NOTE — ED Triage Notes (Signed)
Patient has chronic constipation and does warm water enemas every 3 days, last one on Sunday, performed to Fleet enema today had results, but feels nauseas and bloated.

## 2017-06-08 DIAGNOSIS — J301 Allergic rhinitis due to pollen: Secondary | ICD-10-CM | POA: Diagnosis not present

## 2017-06-10 DIAGNOSIS — J301 Allergic rhinitis due to pollen: Secondary | ICD-10-CM | POA: Diagnosis not present

## 2017-06-25 ENCOUNTER — Other Ambulatory Visit: Payer: Self-pay | Admitting: Endocrinology

## 2017-06-25 DIAGNOSIS — Z1231 Encounter for screening mammogram for malignant neoplasm of breast: Secondary | ICD-10-CM

## 2017-07-07 ENCOUNTER — Ambulatory Visit
Admission: RE | Admit: 2017-07-07 | Discharge: 2017-07-07 | Disposition: A | Discharge status: Home / Self Care | Payer: Medicare Other | Source: Ambulatory Visit | Attending: Endocrinology | Admitting: Endocrinology

## 2017-07-07 DIAGNOSIS — Z1231 Encounter for screening mammogram for malignant neoplasm of breast: Secondary | ICD-10-CM | POA: Diagnosis not present

## 2017-07-09 ENCOUNTER — Inpatient Hospital Stay
Admission: RE | Admit: 2017-07-09 | Discharge: 2017-07-09 | Disposition: A | Discharge status: Home / Self Care | Payer: Self-pay | Source: Ambulatory Visit | Attending: *Deleted | Admitting: *Deleted

## 2017-07-09 ENCOUNTER — Other Ambulatory Visit: Payer: Self-pay | Admitting: *Deleted

## 2017-07-09 DIAGNOSIS — Z9289 Personal history of other medical treatment: Secondary | ICD-10-CM

## 2017-07-13 ENCOUNTER — Encounter: Payer: Self-pay | Admitting: Obstetrics & Gynecology

## 2017-07-17 DIAGNOSIS — Z79899 Other long term (current) drug therapy: Secondary | ICD-10-CM | POA: Diagnosis not present

## 2017-07-17 DIAGNOSIS — M3501 Sicca syndrome with keratoconjunctivitis: Secondary | ICD-10-CM | POA: Diagnosis not present

## 2017-07-17 DIAGNOSIS — M0560 Rheumatoid arthritis of unspecified site with involvement of other organs and systems: Secondary | ICD-10-CM | POA: Diagnosis not present

## 2017-07-27 DIAGNOSIS — G250 Essential tremor: Secondary | ICD-10-CM | POA: Diagnosis not present

## 2017-07-27 DIAGNOSIS — Z79899 Other long term (current) drug therapy: Secondary | ICD-10-CM | POA: Diagnosis not present

## 2017-07-27 DIAGNOSIS — G43719 Chronic migraine without aura, intractable, without status migrainosus: Secondary | ICD-10-CM | POA: Diagnosis not present

## 2017-07-27 DIAGNOSIS — K900 Celiac disease: Secondary | ICD-10-CM | POA: Diagnosis not present

## 2017-07-28 DIAGNOSIS — K210 Gastro-esophageal reflux disease with esophagitis: Secondary | ICD-10-CM | POA: Diagnosis not present

## 2017-07-28 DIAGNOSIS — K581 Irritable bowel syndrome with constipation: Secondary | ICD-10-CM | POA: Diagnosis not present

## 2017-07-28 DIAGNOSIS — Z8371 Family history of colonic polyps: Secondary | ICD-10-CM | POA: Diagnosis not present

## 2017-07-28 DIAGNOSIS — K900 Celiac disease: Secondary | ICD-10-CM | POA: Diagnosis not present

## 2017-07-31 ENCOUNTER — Encounter: Payer: Self-pay | Admitting: *Deleted

## 2017-08-03 ENCOUNTER — Encounter: Payer: Self-pay | Admitting: Anesthesiology

## 2017-08-03 ENCOUNTER — Ambulatory Visit
Admission: RE | Admit: 2017-08-03 | Discharge: 2017-08-03 | Disposition: A | Discharge status: Home Health | Payer: Medicare Other | Source: Ambulatory Visit | Attending: Gastroenterology | Admitting: Gastroenterology

## 2017-08-03 ENCOUNTER — Encounter
Admission: RE | Disposition: A | Discharge status: Home Health | Payer: Self-pay | Source: Ambulatory Visit | Attending: Gastroenterology

## 2017-08-03 ENCOUNTER — Ambulatory Visit: Payer: Self-pay | Admitting: Obstetrics & Gynecology

## 2017-08-03 ENCOUNTER — Ambulatory Visit: Payer: Medicare Other | Admitting: Anesthesiology

## 2017-08-03 DIAGNOSIS — K219 Gastro-esophageal reflux disease without esophagitis: Secondary | ICD-10-CM | POA: Insufficient documentation

## 2017-08-03 DIAGNOSIS — Z8371 Family history of colonic polyps: Secondary | ICD-10-CM | POA: Insufficient documentation

## 2017-08-03 DIAGNOSIS — F329 Major depressive disorder, single episode, unspecified: Secondary | ICD-10-CM | POA: Diagnosis not present

## 2017-08-03 DIAGNOSIS — K621 Rectal polyp: Secondary | ICD-10-CM | POA: Diagnosis not present

## 2017-08-03 DIAGNOSIS — Z85850 Personal history of malignant neoplasm of thyroid: Secondary | ICD-10-CM | POA: Diagnosis not present

## 2017-08-03 DIAGNOSIS — Z886 Allergy status to analgesic agent status: Secondary | ICD-10-CM | POA: Insufficient documentation

## 2017-08-03 DIAGNOSIS — Z1211 Encounter for screening for malignant neoplasm of colon: Secondary | ICD-10-CM | POA: Insufficient documentation

## 2017-08-03 DIAGNOSIS — M797 Fibromyalgia: Secondary | ICD-10-CM | POA: Insufficient documentation

## 2017-08-03 DIAGNOSIS — F419 Anxiety disorder, unspecified: Secondary | ICD-10-CM | POA: Diagnosis not present

## 2017-08-03 DIAGNOSIS — D128 Benign neoplasm of rectum: Secondary | ICD-10-CM | POA: Diagnosis not present

## 2017-08-03 DIAGNOSIS — D123 Benign neoplasm of transverse colon: Secondary | ICD-10-CM | POA: Diagnosis not present

## 2017-08-03 DIAGNOSIS — D122 Benign neoplasm of ascending colon: Secondary | ICD-10-CM | POA: Insufficient documentation

## 2017-08-03 DIAGNOSIS — E785 Hyperlipidemia, unspecified: Secondary | ICD-10-CM | POA: Insufficient documentation

## 2017-08-03 DIAGNOSIS — E039 Hypothyroidism, unspecified: Secondary | ICD-10-CM | POA: Diagnosis not present

## 2017-08-03 DIAGNOSIS — Z881 Allergy status to other antibiotic agents status: Secondary | ICD-10-CM | POA: Diagnosis not present

## 2017-08-03 DIAGNOSIS — E890 Postprocedural hypothyroidism: Secondary | ICD-10-CM | POA: Insufficient documentation

## 2017-08-03 DIAGNOSIS — M1990 Unspecified osteoarthritis, unspecified site: Secondary | ICD-10-CM | POA: Diagnosis not present

## 2017-08-03 DIAGNOSIS — K635 Polyp of colon: Secondary | ICD-10-CM | POA: Diagnosis not present

## 2017-08-03 HISTORY — DX: Celiac disease: K90.0

## 2017-08-03 HISTORY — PX: COLONOSCOPY WITH PROPOFOL: SHX5780

## 2017-08-03 SURGERY — COLONOSCOPY WITH PROPOFOL
Anesthesia: General

## 2017-08-03 MED ORDER — FENTANYL CITRATE (PF) 100 MCG/2ML IJ SOLN
INTRAMUSCULAR | Status: AC
  Filled 2017-08-03: qty 2

## 2017-08-03 MED ORDER — DEXAMETHASONE SODIUM PHOSPHATE 4 MG/ML IJ SOLN
INTRAMUSCULAR | Status: DC | PRN
  Administered 2017-08-03: 10 mg via INTRAVENOUS

## 2017-08-03 MED ORDER — ONDANSETRON HCL 4 MG/2ML IJ SOLN
INTRAMUSCULAR | Status: DC | PRN
  Administered 2017-08-03: 4 mg via INTRAVENOUS

## 2017-08-03 MED ORDER — ONDANSETRON HCL 4 MG/2ML IJ SOLN
INTRAMUSCULAR | Status: AC
  Filled 2017-08-03: qty 2

## 2017-08-03 MED ORDER — SODIUM CHLORIDE 0.9 % IV SOLN
INTRAVENOUS | Status: DC
  Administered 2017-08-03 (×2): via INTRAVENOUS

## 2017-08-03 MED ORDER — PHENYLEPHRINE HCL 10 MG/ML IJ SOLN
INTRAMUSCULAR | Status: DC | PRN
  Administered 2017-08-03: 80 ug via INTRAVENOUS

## 2017-08-03 MED ORDER — PROPOFOL 10 MG/ML IV BOLUS
INTRAVENOUS | Status: DC | PRN
  Administered 2017-08-03: 60 mg via INTRAVENOUS

## 2017-08-03 MED ORDER — SODIUM CHLORIDE 0.9 % IV SOLN: INTRAVENOUS | Status: DC

## 2017-08-03 MED ORDER — PROPOFOL 500 MG/50ML IV EMUL
INTRAVENOUS | Status: AC
  Filled 2017-08-03: qty 100

## 2017-08-03 MED ORDER — PROMETHAZINE HCL 25 MG/ML IJ SOLN
INTRAMUSCULAR | Status: AC
  Administered 2017-08-03: 6.25 mg
  Filled 2017-08-03: qty 1

## 2017-08-03 MED ORDER — LIDOCAINE HCL (CARDIAC) 20 MG/ML IV SOLN
INTRAVENOUS | Status: DC | PRN
  Administered 2017-08-03: 40 mg via INTRAVENOUS

## 2017-08-03 MED ORDER — DEXAMETHASONE SODIUM PHOSPHATE 10 MG/ML IJ SOLN
INTRAMUSCULAR | Status: AC
  Filled 2017-08-03: qty 1

## 2017-08-03 MED ORDER — PROPOFOL 500 MG/50ML IV EMUL
INTRAVENOUS | Status: DC | PRN
  Administered 2017-08-03: 160 ug/kg/min via INTRAVENOUS

## 2017-08-03 MED ORDER — FENTANYL CITRATE (PF) 100 MCG/2ML IJ SOLN
INTRAMUSCULAR | Status: DC | PRN
  Administered 2017-08-03: 25 ug via INTRAVENOUS

## 2017-08-03 NOTE — H&P (Signed)
Outpatient short stay form Pre-procedure 08/03/2017 3:58 PM Lollie Sails MD  Primary Physician: Dr Ronnald Collum  Reason for visit:  Colonoscopy  History of present illness:  Patient is a 61 year old female presenting today as above. She has personal history of IBS/C. There is a family history of colon polyps and a primary relative, father.  She tolerated her prep well. She takes no aspirin or blood thinning agents.  Current Facility-Administered Medications:  .  0.9 %  sodium chloride infusion, , Intravenous, Continuous, Lollie Sails, MD, Last Rate: 20 mL/hr at 08/03/17 1358 .  0.9 %  sodium chloride infusion, , Intravenous, Continuous, Lollie Sails, MD  Prescriptions Prior to Admission  Medication Sig Dispense Refill Last Dose  . albuterol (PROVENTIL HFA;VENTOLIN HFA) 108 (90 BASE) MCG/ACT inhaler Inhale 2 puffs into the lungs every 6 (six) hours as needed for wheezing or shortness of breath. 1 Inhaler 2 Past Month at Unknown time  . albuterol (PROVENTIL) (2.5 MG/3ML) 0.083% nebulizer solution Take 2.5 mg by nebulization every 6 (six) hours as needed for wheezing or shortness of breath.   Past Month at Unknown time  . ALPRAZolam (XANAX) 0.5 MG tablet Take 0.5 mg by mouth 4 (four) times daily as needed. FOR ANXIETY   08/02/2017 at Unknown time  . amitriptyline (ELAVIL) 25 MG tablet Take 25 mg by mouth at bedtime.   08/02/2017 at Unknown time  . carisoprodol (SOMA) 350 MG tablet Take 350 mg by mouth at bedtime. Patient states that she takes 1/2 tablet nightly   Past Month at Unknown time  . DYMISTA 137-50 MCG/ACT SUSP Place 1 spray into both nostrils daily.  0 08/03/2017 at 0830  . eletriptan (RELPAX) 40 MG tablet Take 40 mg by mouth as needed for migraine. may repeat in 2 hours if necessary    08/02/2017 at Unknown time  . estradiol (CLIMARA - DOSED IN MG/24 HR) 0.1 mg/24hr patch Place 1 patch onto the skin once a week. Monday   08/03/2017 at 0830  . hydroxychloroquine (PLAQUENIL)  200 MG tablet Take by mouth 2 (two) times daily.   08/02/2017 at Unknown time  . hyoscyamine (NULEV) 0.125 MG TBDP Place 0.125 mg under the tongue every 4 (four) hours as needed for bladder spasms.    Past Month at Unknown time  . Lactobacillus (DIGESTIVE HEALTH PROBIOTIC PO) Take 1 tablet by mouth daily.   Past Week at Unknown time  . levothyroxine (SYNTHROID, LEVOTHROID) 112 MCG tablet Take 112 mcg by mouth every morning.    08/03/2017 at 0830  . mometasone-formoterol (DULERA) 100-5 MCG/ACT AERO Inhale 2 puffs into the lungs 2 (two) times daily.   08/03/2017 at 0830  . montelukast (SINGULAIR) 10 MG tablet Take 1 tablet (10 mg total) by mouth daily. 30 tablet 0 08/02/2017 at Unknown time  . Multiple Vitamins-Minerals (ADULT GUMMY PO) Take 2 tablets by mouth daily.   Past Week at Unknown time  . ondansetron (ZOFRAN ODT) 4 MG disintegrating tablet Take 1 tablet (4 mg total) by mouth every 8 (eight) hours as needed for nausea or vomiting. 20 tablet 0 Past Month at Unknown time  . pantoprazole (PROTONIX) 40 MG tablet Take 40 mg by mouth daily.   08/03/2017 at 0830  . PRESCRIPTION MEDICATION 1 Syringe by Subdermal route once a week. Allergy shot   Past Week at Unknown time  . sodium chloride (OCEAN) 0.65 % SOLN nasal spray Place 1 spray into both nostrils as needed for congestion.   Past Month  at Unknown time  . sodium chloride 0.9 % nebulizer solution Take 3 mLs by nebulization 3 (three) times daily.   Past Month at Unknown time  . SUMAtriptan-naproxen (TREXIMET) 85-500 MG per tablet Take 1 tablet by mouth daily as needed for migraine.   Past Week at Unknown time  . venlafaxine XR (EFFEXOR-XR) 150 MG 24 hr capsule Take 300 mg by mouth at bedtime.   08/02/2017 at Unknown time  . Vitamin D, Ergocalciferol, (DRISDOL) 50000 units CAPS capsule Take 50,000 Units by mouth every 7 (seven) days. Monday   Past Week at Unknown time  . EPINEPHrine 0.3 mg/0.3 mL IJ SOAJ injection Inject 0.3 mg into the muscle once.     .  fluticasone (FLONASE) 50 MCG/ACT nasal spray Place 2 sprays into both nostrils daily.    Not Taking at Unknown time  . Fluticasone Furoate (ARNUITY ELLIPTA) 100 MCG/ACT AEPB Inhale 2 puffs into the lungs every morning.   Not Taking at Unknown time  . magnesium citrate SOLN Take 1 Bottle by mouth as needed for mild constipation.    Not Taking at Unknown time     Allergies  Allergen Reactions  . Aspirin   . Keflex [Cephalexin] Shortness Of Breath  . Azithromycin Hives and Nausea Only  . Barley Grass   . Clarithromycin Other (See Comments)  . Metoclopramide Other (See Comments)    TREMORS  . Oat Other (See Comments)  . Rye Grass Flower Pollen Extract [Gramineae Pollens]   . Vioxx [Rofecoxib] Diarrhea  . Wheat Extract   . Celebrex [Celecoxib] Hives  . Metoclopramide Hcl Anxiety    BODY TREMORS     Past Medical History:  Diagnosis Date  . Arthritis   . Asthmatic bronchitis   . Celiac disease   . Celiac sprue   . Chronic anxiety   . Complication of anesthesia    vomiting  . Depression   . Fibromyalgia   . Gastritis 10/08/2013  . GERD (gastroesophageal reflux disease)   . Hyperlipemia   . Hyperlipidemia    Intolerant to statins  . Hypothyroidism (acquired)   . Migraine   . 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 Pancreatitis   . Sjogren's disease ([place])   . Spastic colon   . Thyroid cancer ([place]) 1990 and 1994   Total thyroidectomy with radioactive iodine.     Review of systems:      Physical Exam    Heart and lungs: Regular rate and rhythm without rub or gallop, lungs are bilaterally clear    HEENT: Normocephalic atraumatic eyes are anicteric    Other:     Pertinant exam for procedure: Soft nontender nondistended bowel sounds positive normoactive    Planned proceedures: Colonoscopy and indicated procedures I have discussed the risks benefits and complications of procedures to include not limited to bleeding, infection, perforation and the risk of sedation and the  patient wishes to proceed.    Lollie Sails, MD Gastroenterology 08/03/2017  3:58 PM

## 2017-08-03 NOTE — Anesthesia Post-op Follow-up Note (Signed)
Anesthesia QCDR form completed.        

## 2017-08-03 NOTE — Anesthesia Postprocedure Evaluation (Signed)
Anesthesia Post Note  Patient: Susan [name]  Procedure(s) Performed: COLONOSCOPY WITH PROPOFOL (N/A )  Patient location during evaluation: Endoscopy Anesthesia Type: General Level of consciousness: awake and alert Pain management: pain level controlled Vital Signs Assessment: post-procedure vital signs reviewed and stable Respiratory status: spontaneous breathing, nonlabored ventilation, respiratory function stable and patient connected to nasal cannula oxygen Cardiovascular status: blood pressure returned to baseline and stable Postop Assessment: no apparent nausea or vomiting Anesthetic complications: no     Last Vitals:  Vitals:   08/03/17 1720 08/03/17 1730  BP: 118/79 114/84  Pulse: 62 64  Resp: 10 16  Temp:    SpO2: 100% 100%    Last Pain:  Vitals:   08/03/17 1650  TempSrc: Tympanic                 Precious Haws Anahlia Iseminger

## 2017-08-03 NOTE — Transfer of Care (Signed)
Immediate Anesthesia Transfer of Care Note  Patient: Susan [name]  Procedure(s) Performed: COLONOSCOPY WITH PROPOFOL (N/A )  Patient Location: Endoscopy Unit  Anesthesia Type:General  Level of Consciousness: drowsy and patient cooperative  Airway & Oxygen Therapy: Patient Spontanous Breathing and Patient connected to nasal cannula oxygen  Post-op Assessment: Report given to RN, Post -op Vital signs reviewed and stable and Patient moving all extremities X 4  Post vital signs: Reviewed and stable  Last Vitals:  Vitals:   08/03/17 1338  BP: (!) 135/96  Pulse: 78  Resp: 18  Temp: (!) 36 C  SpO2: 100%    Last Pain:  Vitals:   08/03/17 1338  TempSrc: Tympanic         Complications: No apparent anesthesia complications

## 2017-08-03 NOTE — Op Note (Signed)
[facility] Gastroenterology Patient Name: Susan [name] Procedure Date: 08/03/2017 3:55 PM MRN: [id number] Account #: [id number] Date of Birth: 07-26-1956 Admit Type: Outpatient Age: 61 Room: [facility] ENDO ROOM 1 Gender: Female Note Status: Finalized Procedure:            Colonoscopy Indications:          Family history of colonic polyps in a first-degree                        relative Providers:            Lollie Sails, MD Referring MD:         Lenard Simmer, MD (Referring MD) Medicines:            Monitored Anesthesia Care Complications:        No immediate complications. Procedure:            Pre-Anesthesia Assessment:                       - ASA Grade Assessment: III - A patient with severe                        systemic disease.                       After obtaining informed consent, the colonoscope was                        passed under direct vision. Throughout the procedure,                        the patient's blood pressure, pulse, and oxygen                        saturations were monitored continuously. The Olympus                        PCF-H180AL colonoscope ( S#: [id number] ) was introduced                        through the anus and advanced to the the cecum,                        identified by appendiceal orifice and ileocecal valve.                        The colonoscopy was performed with moderate difficulty                        due to a tortuous colon. Successful completion of the                        procedure was aided by using manual pressure. Findings:      A 2 mm polyp was found in the transverse colon. The polyp was sessile.       The polyp was removed with a cold biopsy forceps. Resection and       retrieval were complete.      A 2 mm polyp was found in the ascending colon. The polyp was sessile.       The polyp was removed  with a cold biopsy forceps. Resection and       retrieval were complete.      A 3 mm polyp was  found in the ascending colon. The polyp was sessile.       The polyp was removed with a cold snare. Resection and retrieval were       complete.      A 4 mm polyp was found in the distal ascending colon. The polyp was       sessile. The polyp was removed with a cold snare. Resection and       retrieval were complete.      A 2 mm polyp was found in the transverse colon. The polyp was sessile.       The polyp was removed with a cold biopsy forceps. Resection and       retrieval were complete.      A less than 1 mm polyp was found in the rectum. The polyp was sessile.       The polyp was removed with a cold biopsy forceps. Resection and       retrieval were complete.      The digital rectal exam was normal. Impression:           - One 2 mm polyp in the transverse colon, removed with                        a cold biopsy forceps. Resected and retrieved.                       - One 2 mm polyp in the ascending colon, removed with a                        cold biopsy forceps. Resected and retrieved.                       - One 3 mm polyp in the ascending colon, removed with a                        cold snare. Resected and retrieved.                       - One 4 mm polyp in the distal ascending colon, removed                        with a cold snare. Resected and retrieved.                       - One 2 mm polyp in the transverse colon, removed with                        a cold biopsy forceps. Resected and retrieved.                       - One less than 1 mm polyp in the rectum, removed with                        a cold biopsy forceps. Resected and retrieved. Recommendation:       - Discharge patient to home.                       -  Use Linzess (linaclotide) 290 mcg PO daily.                       - Return to GI clinic in 1 month. Procedure Code(s):    --- Professional ---                       [id number], Colonoscopy, flexible; with removal of tumor(s),                        polyp(s), or other  lesion(s) by snare technique                       45380, 35, Colonoscopy, flexible; with biopsy, single                        or multiple Diagnosis Code(s):    --- Professional ---                       D12.2, Benign neoplasm of ascending colon                       D12.3, Benign neoplasm of transverse colon (hepatic                        flexure or splenic flexure)                       K62.1, Rectal polyp                       Z83.71, Family history of colonic polyps CPT copyright 2016 American Medical Association. All rights reserved. The codes documented in this report are preliminary and upon coder review may  be revised to meet current compliance requirements. Lollie Sails, MD 08/03/2017 4:51:40 PM This report has been signed electronically. Number of Addenda: 0 Note Initiated On: 08/03/2017 3:55 PM Scope Withdrawal Time: 0 hours 15 minutes 27 seconds  Total Procedure Duration: 0 hours 37 minutes 0 seconds       [facility]

## 2017-08-03 NOTE — Anesthesia Preprocedure Evaluation (Signed)
Anesthesia Evaluation  Patient identified by MRN, date of birth, ID band Patient awake    Reviewed: Allergy & Precautions, NPO status , Patient's Chart, lab work & pertinent test results, reviewed documented beta blocker date and time   History of Anesthesia Complications (+) history of anesthetic complications  Airway Mallampati: II  TM Distance: >3 FB     Dental  (+) Chipped   Pulmonary asthma ,           Cardiovascular      Neuro/Psych  Headaches, PSYCHIATRIC DISORDERS Anxiety Depression  Neuromuscular disease    GI/Hepatic GERD  ,  Endo/Other  Hypothyroidism   Renal/GU      Musculoskeletal  (+) Arthritis , Fibromyalgia -  Abdominal   Peds  Hematology   Anesthesia Other Findings   Reproductive/Obstetrics                             Anesthesia Physical Anesthesia Plan  ASA: III  Anesthesia Plan: General   Post-op Pain Management:    Induction: Intravenous  PONV Risk Score and Plan:   Airway Management Planned:   Additional Equipment:   Intra-op Plan:   Post-operative Plan:   Informed Consent: I have reviewed the patients History and Physical, chart, labs and discussed the procedure including the risks, benefits and alternatives for the proposed anesthesia with the patient or authorized representative who has indicated his/her understanding and acceptance.     Plan Discussed with: CRNA  Anesthesia Plan Comments:         Anesthesia Quick Evaluation

## 2017-08-04 ENCOUNTER — Encounter: Payer: Self-pay | Admitting: Gastroenterology

## 2017-08-10 DIAGNOSIS — F445 Conversion disorder with seizures or convulsions: Secondary | ICD-10-CM | POA: Diagnosis not present

## 2017-08-10 DIAGNOSIS — R0602 Shortness of breath: Secondary | ICD-10-CM | POA: Diagnosis not present

## 2017-08-10 DIAGNOSIS — E559 Vitamin D deficiency, unspecified: Secondary | ICD-10-CM | POA: Diagnosis not present

## 2017-08-10 DIAGNOSIS — K229 Disease of esophagus, unspecified: Secondary | ICD-10-CM | POA: Diagnosis not present

## 2017-08-10 DIAGNOSIS — C73 Malignant neoplasm of thyroid gland: Secondary | ICD-10-CM | POA: Diagnosis not present

## 2017-08-10 DIAGNOSIS — E7849 Other hyperlipidemia: Secondary | ICD-10-CM | POA: Diagnosis not present

## 2017-08-14 DIAGNOSIS — C73 Malignant neoplasm of thyroid gland: Secondary | ICD-10-CM | POA: Diagnosis not present

## 2017-08-14 DIAGNOSIS — F411 Generalized anxiety disorder: Secondary | ICD-10-CM | POA: Diagnosis not present

## 2017-08-14 DIAGNOSIS — K589 Irritable bowel syndrome without diarrhea: Secondary | ICD-10-CM | POA: Diagnosis not present

## 2017-08-14 DIAGNOSIS — Z23 Encounter for immunization: Secondary | ICD-10-CM | POA: Diagnosis not present

## 2017-08-14 DIAGNOSIS — E785 Hyperlipidemia, unspecified: Secondary | ICD-10-CM | POA: Diagnosis not present

## 2017-08-14 DIAGNOSIS — E7800 Pure hypercholesterolemia, unspecified: Secondary | ICD-10-CM | POA: Diagnosis not present

## 2017-08-14 DIAGNOSIS — M3500 Sicca syndrome, unspecified: Secondary | ICD-10-CM | POA: Diagnosis not present

## 2017-08-14 DIAGNOSIS — F445 Conversion disorder with seizures or convulsions: Secondary | ICD-10-CM | POA: Diagnosis not present

## 2017-08-14 DIAGNOSIS — K900 Celiac disease: Secondary | ICD-10-CM | POA: Diagnosis not present

## 2017-08-14 DIAGNOSIS — E039 Hypothyroidism, unspecified: Secondary | ICD-10-CM | POA: Diagnosis not present

## 2017-08-14 DIAGNOSIS — K5730 Diverticulosis of large intestine without perforation or abscess without bleeding: Secondary | ICD-10-CM | POA: Diagnosis not present

## 2017-08-28 DIAGNOSIS — M3500 Sicca syndrome, unspecified: Secondary | ICD-10-CM | POA: Diagnosis not present

## 2017-08-28 DIAGNOSIS — M1990 Unspecified osteoarthritis, unspecified site: Secondary | ICD-10-CM | POA: Diagnosis not present

## 2017-09-01 ENCOUNTER — Encounter: Payer: Self-pay | Admitting: Obstetrics & Gynecology

## 2017-09-01 ENCOUNTER — Ambulatory Visit (INDEPENDENT_AMBULATORY_CARE_PROVIDER_SITE_OTHER): Payer: Medicare Other | Admitting: Obstetrics & Gynecology

## 2017-09-01 VITALS — BP 118/80 | HR 79 | Ht 65.0 in | Wt 185.0 lb

## 2017-09-01 DIAGNOSIS — Z01419 Encounter for gynecological examination (general) (routine) without abnormal findings: Secondary | ICD-10-CM

## 2017-09-01 DIAGNOSIS — Z1272 Encounter for screening for malignant neoplasm of vagina: Secondary | ICD-10-CM

## 2017-09-01 DIAGNOSIS — R87612 Low grade squamous intraepithelial lesion on cytologic smear of cervix (LGSIL): Secondary | ICD-10-CM | POA: Diagnosis not present

## 2017-09-01 DIAGNOSIS — Z0000 Encounter for general adult medical examination without abnormal findings: Secondary | ICD-10-CM

## 2017-09-01 DIAGNOSIS — N952 Postmenopausal atrophic vaginitis: Secondary | ICD-10-CM | POA: Insufficient documentation

## 2017-09-01 MED ORDER — ESTRADIOL 0.1 MG/24HR TD PTWK: 0.1000 mg | MEDICATED_PATCH | TRANSDERMAL | 11 refills | Status: DC

## 2017-09-01 NOTE — Patient Instructions (Signed)
PAP every three years Mammogram every year Colonoscopy every 10 years Labs yearly (with PCP)

## 2017-09-01 NOTE — Progress Notes (Addendum)
HPI:      [name] is a 61 y.o. G0P0000 who LMP was in the past, she presents today for her annual examination.  The patient has no complaints today. The patient is sexually active. Herlast pap: was normal and last mammogram: was normal.  The patient does perform self breast exams.  There is no notable family history of breast or ovarian cancer in her family. The patient is taking hormone replacement therapy. Patient denies post-menopausal vaginal bleeding.   The patient has regular exercise: yes. The patient denies current symptoms of depression.    GYN Hx: Last Colonoscopy:1 month ago. Normal.  Last DEXA: never ago.    PMHx: Past Medical History:  Diagnosis Date  . Arthritis   . Asthmatic bronchitis   . Celiac disease   . Celiac sprue   . Chronic anxiety   . Complication of anesthesia    vomiting  . Depression   . Fibromyalgia   . Gastritis 10/08/2013  . GERD (gastroesophageal reflux disease)   . Hyperlipemia   . Hyperlipidemia    Intolerant to statins  . Hypothyroidism (acquired)   . Migraine   . 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 Pancreatitis   . Sjogren's disease ([place])   . Spastic colon   . Thyroid cancer ([place]) 1990 and 1994   Total thyroidectomy with radioactive iodine.    Past Surgical History:  Procedure Laterality Date  . ABDOMINAL HYSTERECTOMY    . BLADDER REPAIR    . BREAST CYST ASPIRATION Left yrs ago  . CHOLECYSTECTOMY    . COLONOSCOPY WITH ESOPHAGOGASTRODUODENOSCOPY (EGD)    . NASAL SINUS SURGERY    . THYROID LOBECTOMY     Family History  Problem Relation Age of Onset  . Arthritis/Rheumatoid Mother        died in age 90s  . Breast cancer Mother 27  . Breast cancer Maternal Grandmother        40's  . Breast cancer Maternal Aunt        X 2. 50's  . Lung cancer Maternal Aunt   . Pancreatitis Neg Hx   . Colon cancer Neg Hx    Social History   Tobacco Use  . Smoking status: Never Smoker  . Smokeless tobacco: Never Used  Substance Use  Topics  . Alcohol use: No  . Drug use: No    Current Outpatient Medications:  .  albuterol (PROVENTIL HFA;VENTOLIN HFA) 108 (90 BASE) MCG/ACT inhaler, Inhale 2 puffs into the lungs every 6 (six) hours as needed for wheezing or shortness of breath., Disp: 1 Inhaler, Rfl: 2 .  albuterol (PROVENTIL) (2.5 MG/3ML) 0.083% nebulizer solution, Take 2.5 mg by nebulization every 6 (six) hours as needed for wheezing or shortness of breath., Disp: , Rfl:  .  ALPRAZolam (XANAX) 0.5 MG tablet, Take 0.5 mg by mouth 4 (four) times daily as needed. FOR ANXIETY, Disp: , Rfl:  .  amitriptyline (ELAVIL) 25 MG tablet, Take 25 mg by mouth at bedtime., Disp: , Rfl:  .  carisoprodol (SOMA) 350 MG tablet, Take 350 mg by mouth at bedtime. Patient states that she takes 1/2 tablet nightly, Disp: , Rfl:  .  DYMISTA 137-50 MCG/ACT SUSP, Place 1 spray into both nostrils daily., Disp: , Rfl: 0 .  eletriptan (RELPAX) 40 MG tablet, Take 40 mg by mouth as needed for migraine. may repeat in 2 hours if necessary , Disp: , Rfl:  .  EPINEPHrine 0.3 mg/0.3 mL IJ SOAJ injection, Inject 0.3  mg into the muscle once., Disp: , Rfl:  .  estradiol (CLIMARA - DOSED IN MG/24 HR) 0.1 mg/24hr patch, Place 1 patch (0.1 mg total) once a week onto the skin. Monday, Disp: 4 patch, Rfl: 11 .  fluticasone (FLONASE) 50 MCG/ACT nasal spray, Place 2 sprays into both nostrils daily. , Disp: , Rfl:  .  Fluticasone Furoate (ARNUITY ELLIPTA) 100 MCG/ACT AEPB, Inhale 2 puffs into the lungs every morning., Disp: , Rfl:  .  hydroxychloroquine (PLAQUENIL) 200 MG tablet, Take by mouth 2 (two) times daily., Disp: , Rfl:  .  hyoscyamine (NULEV) 0.125 MG TBDP, Place 0.125 mg under the tongue every 4 (four) hours as needed for bladder spasms. , Disp: , Rfl:  .  Lactobacillus (DIGESTIVE HEALTH PROBIOTIC PO), Take 1 tablet by mouth daily., Disp: , Rfl:  .  levothyroxine (SYNTHROID, LEVOTHROID) 112 MCG tablet, Take 112 mcg by mouth every morning. , Disp: , Rfl:  .   LINZESS 290 MCG CAPS capsule, , Disp: , Rfl: 1 .  magnesium citrate SOLN, Take 1 Bottle by mouth as needed for mild constipation. , Disp: , Rfl:  .  mometasone-formoterol (DULERA) 100-5 MCG/ACT AERO, Inhale 2 puffs into the lungs 2 (two) times daily., Disp: , Rfl:  .  montelukast (SINGULAIR) 10 MG tablet, Take 1 tablet (10 mg total) by mouth daily., Disp: 30 tablet, Rfl: 0 .  Multiple Vitamins-Minerals (ADULT GUMMY PO), Take 2 tablets by mouth daily., Disp: , Rfl:  .  ondansetron (ZOFRAN ODT) 4 MG disintegrating tablet, Take 1 tablet (4 mg total) by mouth every 8 (eight) hours as needed for nausea or vomiting., Disp: 20 tablet, Rfl: 0 .  pantoprazole (PROTONIX) 40 MG tablet, Take 40 mg by mouth daily., Disp: , Rfl:  .  PRESCRIPTION MEDICATION, 1 Syringe by Subdermal route once a week. Allergy shot, Disp: , Rfl:  .  sodium chloride (OCEAN) 0.65 % SOLN nasal spray, Place 1 spray into both nostrils as needed for congestion., Disp: , Rfl:  .  sodium chloride 0.9 % nebulizer solution, Take 3 mLs by nebulization 3 (three) times daily., Disp: , Rfl:  .  SUMAtriptan-naproxen (TREXIMET) 85-500 MG per tablet, Take 1 tablet by mouth daily as needed for migraine., Disp: , Rfl:  .  venlafaxine XR (EFFEXOR-XR) 150 MG 24 hr capsule, Take 300 mg by mouth at bedtime., Disp: , Rfl:  .  Vitamin D, Ergocalciferol, (DRISDOL) 50000 units CAPS capsule, Take 50,000 Units by mouth every 7 (seven) days. Monday, Disp: , Rfl:  Allergies: Aspirin; Keflex [cephalexin]; Azithromycin; Barley grass; Clarithromycin; Metoclopramide; Oat; Rye grass flower pollen extract [gramineae pollens]; Vioxx [rofecoxib]; Wheat extract; Celebrex [celecoxib]; and Metoclopramide hcl  Review of Systems  Constitutional: Negative for chills, fever and malaise/fatigue.  HENT: Negative for congestion, sinus pain and sore throat.   Eyes: Negative for blurred vision and pain.  Respiratory: Negative for cough and wheezing.   Cardiovascular: Negative for  chest pain and leg swelling.  Gastrointestinal: Negative for abdominal pain, constipation, diarrhea, heartburn, nausea and vomiting.  Genitourinary: Negative for dysuria, frequency, hematuria and urgency.  Musculoskeletal: Negative for back pain, joint pain, myalgias and neck pain.  Skin: Negative for itching and rash.  Neurological: Negative for dizziness, tremors and weakness.  Endo/Heme/Allergies: Does not bruise/bleed easily.  Psychiatric/Behavioral: Negative for depression. The patient is not nervous/anxious and does not have insomnia.     Objective: BP 118/80   Pulse 79   Ht 5' 5"  (1.651 m)   Wt 185 lb (  83.9 kg)   BMI 30.79 kg/m   Filed Weights   09/01/17 1415  Weight: 185 lb (83.9 kg)   Body mass index is 30.79 kg/m. Physical Exam  Constitutional: She is oriented to person, place, and time. She appears well-developed and well-nourished. No distress.  Genitourinary: Rectum normal and vagina normal. Pelvic exam was performed with patient supine. There is no rash or lesion on the right labia. There is no rash or lesion on the left labia. Vagina exhibits no lesion. No bleeding in the vagina. Right adnexum does not display mass and does not display tenderness. Left adnexum does not display mass and does not display tenderness.  Genitourinary Comments: Absent Uterus Absent cervix Vaginal shortened; atrophy present  HENT:  Head: Normocephalic and atraumatic. Head is without laceration.  Right Ear: Hearing normal.  Left Ear: Hearing normal.  Nose: No epistaxis.  No foreign bodies.  Mouth/Throat: Uvula is midline, oropharynx is clear and moist and mucous membranes are normal.  Eyes: Pupils are equal, round, and reactive to light.  Neck: Normal range of motion. Neck supple. No thyromegaly present.  Cardiovascular: Normal rate and regular rhythm. Exam reveals no gallop and no friction rub.  No murmur heard. Pulmonary/Chest: Effort normal and breath sounds normal. No respiratory  distress. She has no wheezes. Right breast exhibits no mass, no skin change and no tenderness. Left breast exhibits no mass, no skin change and no tenderness.  Abdominal: Soft. Bowel sounds are normal. She exhibits no distension. There is no tenderness. There is no rebound.  Musculoskeletal: Normal range of motion.  Neurological: She is alert and oriented to person, place, and time. No cranial nerve deficit.  Skin: Skin is warm and dry.  Psychiatric: She has a normal mood and affect. Judgment normal.  Vitals reviewed.   Assessment: Annual Exam 1. Screening for vaginal cancer   2. Annual physical exam   3. Vaginal atrophy     Plan:            1.  Cervical Screening-  Pap smear done today  2. Breast screening- Exam annually and mammogram scheduled (DONE 06/2017)  3. Colonoscopy every 10 years (DONE 07/2017), Hemoccult testing after age 7  4. Labs managed by PCP  5. Counseling for hormonal therapy: no change in therapy today.  Climara weekly patch.  Replens also to help w vag atrophy.     F/U  Return in about 1 year (around 09/01/2018) for Annual.  Barnett Applebaum, MD, [facility], [facility] 09/01/2017  2:39 PM

## 2017-09-02 LAB — PAP IG (IMAGE GUIDED): PAP SMEAR COMMENT: 0

## 2017-09-04 ENCOUNTER — Telehealth: Payer: Self-pay | Admitting: Obstetrics & Gynecology

## 2017-09-04 NOTE — Progress Notes (Signed)
[facility].  OK to overbook 11/14 as this is good day for patient (she's from out of town), or a later day that is convenient to the both of our schedules

## 2017-09-04 NOTE — Telephone Encounter (Signed)
Called and lvm for pt to call back to be schedule

## 2017-09-04 NOTE — Telephone Encounter (Signed)
-----   Message from Gae Dry, MD sent at 09/04/2017  7:53 AM EST ----- [facility].  OK to overbook 11/14 as this is good day for patient (she's from out of town), or a later day that is convenient to the both of our schedules

## 2017-09-09 ENCOUNTER — Encounter: Payer: Self-pay | Admitting: Obstetrics & Gynecology

## 2017-09-09 ENCOUNTER — Ambulatory Visit (INDEPENDENT_AMBULATORY_CARE_PROVIDER_SITE_OTHER): Payer: Medicare Other | Admitting: Obstetrics & Gynecology

## 2017-09-09 VITALS — BP 110/80 | HR 83 | Ht 65.0 in | Wt 186.0 lb

## 2017-09-09 DIAGNOSIS — L905 Scar conditions and fibrosis of skin: Secondary | ICD-10-CM | POA: Diagnosis not present

## 2017-09-09 DIAGNOSIS — Z86010 Personal history of colonic polyps: Secondary | ICD-10-CM | POA: Diagnosis not present

## 2017-09-09 DIAGNOSIS — R87622 Low grade squamous intraepithelial lesion on cytologic smear of vagina (LGSIL): Secondary | ICD-10-CM | POA: Diagnosis not present

## 2017-09-09 DIAGNOSIS — L738 Other specified follicular disorders: Secondary | ICD-10-CM | POA: Diagnosis not present

## 2017-09-09 DIAGNOSIS — L280 Lichen simplex chronicus: Secondary | ICD-10-CM | POA: Diagnosis not present

## 2017-09-09 DIAGNOSIS — L011 Impetiginization of other dermatoses: Secondary | ICD-10-CM | POA: Diagnosis not present

## 2017-09-09 NOTE — Progress Notes (Signed)
HPI:  Susan [name] is a 61 y.o.  G0P0000  who presents today for evaluation and management of abnormal cervical cytology.    Dysplasia History:  LGSIL of recent vag PAP Prior TAH  ROS:  Pertinent items noted in HPI and remainder of comprehensive ROS otherwise negative.  OB History  Gravida Para Term Preterm AB Living  0 0 0 0 0 0  SAB TAB Ectopic Multiple Live Births  0 0 0 0 0        Past Medical History:  Diagnosis Date  . Arthritis   . Asthmatic bronchitis   . Celiac disease   . Celiac sprue   . Chronic anxiety   . Complication of anesthesia    vomiting  . Depression   . Fibromyalgia   . Gastritis 10/08/2013  . GERD (gastroesophageal reflux disease)   . Hyperlipemia   . Hyperlipidemia    Intolerant to statins  . Hypothyroidism (acquired)   . Migraine   . 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 Pancreatitis   . Sjogren's disease ([place])   . Spastic colon   . Thyroid cancer ([place]) 1990 and 1994   Total thyroidectomy with radioactive iodine.     Past Surgical History:  Procedure Laterality Date  . ABDOMINAL HYSTERECTOMY    . BLADDER REPAIR    . BREAST CYST ASPIRATION Left yrs ago  . CHOLECYSTECTOMY    . COLONOSCOPY WITH ESOPHAGOGASTRODUODENOSCOPY (EGD)    . NASAL SINUS SURGERY    . THYROID LOBECTOMY      SOCIAL HISTORY:  Social History   Substance and Sexual Activity  Alcohol Use No    Social History   Substance and Sexual Activity  Drug Use No     Family History  Problem Relation Age of Onset  . Arthritis/Rheumatoid Mother        died in age 76s  . Breast cancer Mother 30  . Breast cancer Maternal Grandmother        40's  . Breast cancer Maternal Aunt        X 2. 50's  . Lung cancer Maternal Aunt   . Pancreatitis Neg Hx   . Colon cancer Neg Hx     ALLERGIES:  Aspirin; Keflex [cephalexin]; Azithromycin; Barley grass; Clarithromycin; Metoclopramide; Oat; Rye grass flower pollen extract [gramineae pollens]; Vioxx [rofecoxib]; Wheat  extract; Celebrex [celecoxib]; and Metoclopramide hcl  Current Outpatient Medications on File Prior to Visit  Medication Sig Dispense Refill  . albuterol (PROVENTIL HFA;VENTOLIN HFA) 108 (90 BASE) MCG/ACT inhaler Inhale 2 puffs into the lungs every 6 (six) hours as needed for wheezing or shortness of breath. 1 Inhaler 2  . albuterol (PROVENTIL) (2.5 MG/3ML) 0.083% nebulizer solution Take 2.5 mg by nebulization every 6 (six) hours as needed for wheezing or shortness of breath.    . ALPRAZolam (XANAX) 0.5 MG tablet Take 0.5 mg by mouth 4 (four) times daily as needed. FOR ANXIETY    . amitriptyline (ELAVIL) 25 MG tablet Take 25 mg by mouth at bedtime.    . carisoprodol (SOMA) 350 MG tablet Take 350 mg by mouth at bedtime. Patient states that she takes 1/2 tablet nightly    . DYMISTA 137-50 MCG/ACT SUSP Place 1 spray into both nostrils daily.  0  . eletriptan (RELPAX) 40 MG tablet Take 40 mg by mouth as needed for migraine. may repeat in 2 hours if necessary     . EPINEPHrine 0.3 mg/0.3 mL IJ SOAJ injection Inject 0.3 mg into the muscle  once.    . estradiol (CLIMARA - DOSED IN MG/24 HR) 0.1 mg/24hr patch Place 1 patch (0.1 mg total) once a week onto the skin. Monday 4 patch 11  . fluticasone (FLONASE) 50 MCG/ACT nasal spray Place 2 sprays into both nostrils daily.     . Fluticasone Furoate (ARNUITY ELLIPTA) 100 MCG/ACT AEPB Inhale 2 puffs into the lungs every morning.    . hydroxychloroquine (PLAQUENIL) 200 MG tablet Take by mouth 2 (two) times daily.    . hyoscyamine (NULEV) 0.125 MG TBDP Place 0.125 mg under the tongue every 4 (four) hours as needed for bladder spasms.     . Lactobacillus (DIGESTIVE HEALTH PROBIOTIC PO) Take 1 tablet by mouth daily.    Marland Kitchen levothyroxine (SYNTHROID, LEVOTHROID) 112 MCG tablet Take 112 mcg by mouth every morning.     Marland Kitchen LINZESS 290 MCG CAPS capsule   1  . magnesium citrate SOLN Take 1 Bottle by mouth as needed for mild constipation.     . mometasone-formoterol (DULERA)  100-5 MCG/ACT AERO Inhale 2 puffs into the lungs 2 (two) times daily.    . montelukast (SINGULAIR) 10 MG tablet Take 1 tablet (10 mg total) by mouth daily. 30 tablet 0  . Multiple Vitamins-Minerals (ADULT GUMMY PO) Take 2 tablets by mouth daily.    . ondansetron (ZOFRAN ODT) 4 MG disintegrating tablet Take 1 tablet (4 mg total) by mouth every 8 (eight) hours as needed for nausea or vomiting. 20 tablet 0  . pantoprazole (PROTONIX) 40 MG tablet Take 40 mg by mouth daily.    Marland Kitchen PRESCRIPTION MEDICATION 1 Syringe by Subdermal route once a week. Allergy shot    . sodium chloride (OCEAN) 0.65 % SOLN nasal spray Place 1 spray into both nostrils as needed for congestion.    . sodium chloride 0.9 % nebulizer solution Take 3 mLs by nebulization 3 (three) times daily.    . SUMAtriptan-naproxen (TREXIMET) 85-500 MG per tablet Take 1 tablet by mouth daily as needed for migraine.    . venlafaxine XR (EFFEXOR-XR) 150 MG 24 hr capsule Take 300 mg by mouth at bedtime.    . Vitamin D, Ergocalciferol, (DRISDOL) 50000 units CAPS capsule Take 50,000 Units by mouth every 7 (seven) days. Monday     No current facility-administered medications on file prior to visit.    Physical Exam: -Vitals:  BP 110/80   Pulse 83   Ht 5' 5"  (1.651 m)   Wt 186 lb (84.4 kg)   BMI 30.95 kg/m  GEN: WD, WN, NAD.  A+ O x 3, good mood and affect. ABD:  NT, ND.  Soft, no masses.  No hernias noted.   Pelvic:   Vulva: Normal appearance.  No lesions.  Vagina: No lesions or abnormalities noted.  Support: Normal pelvic support.  Urethra No masses tenderness or scarring.  Meatus Normal size without lesions or prolapse.  Cervix: See below.  Anus: Normal exam.  No lesions.  Perineum: Normal exam.  No lesions.        Bimanual   Uterus: Normal size.  Non-tender.  Mobile.  AV.  Adnexae: No masses.  Non-tender to palpation.  Cul-de-sac: Negative for abnormality.   PROCEDURE: 1.  Urine Pregnancy Test:  not done 2.  Colposcopy performed  with 4% acetic acid after verbal consent obtained                            -Aceto-white Lesions Location(s): none. Small  vasc lesion 1 oclock              -Biopsy performed at 1 o'clock               -ECC indicated and performed: No.     -Biopsy sites made hemostatic with pressure, AgNO3, and/or Monsel's solution   -Satisfactory colposcopy: Yes.      -Evidence of Invasive CA :  NO  ASSESSMENT:  Susan [name] is a 61 y.o. WF 1. 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  PLAN: 1.  I discussed the grading system of pap smears and HPV high risk viral types.  We will discuss and base management after colpo results return. 2. Follow up PAP 12 months, vs intervention if high grade dysplasia identified     Barnett Applebaum, MD, [facility] 09/09/2017  9:57 AM

## 2017-09-11 ENCOUNTER — Telehealth: Payer: Self-pay | Admitting: Obstetrics & Gynecology

## 2017-09-11 LAB — PATHOLOGY

## 2017-09-11 NOTE — Telephone Encounter (Signed)
Pt is calling due to missed call. Please advise

## 2017-09-14 NOTE — Telephone Encounter (Signed)
Yes and have discussed with her results

## 2017-09-14 NOTE — Progress Notes (Signed)
Normal vag bx d/w pt, reassured

## 2017-09-14 NOTE — Telephone Encounter (Signed)
Did you call pt?

## 2017-09-15 ENCOUNTER — Encounter: Payer: Self-pay | Admitting: Obstetrics and Gynecology

## 2017-09-29 DIAGNOSIS — R9431 Abnormal electrocardiogram [ECG] [EKG]: Secondary | ICD-10-CM | POA: Diagnosis not present

## 2017-09-29 DIAGNOSIS — R0989 Other specified symptoms and signs involving the circulatory and respiratory systems: Secondary | ICD-10-CM | POA: Diagnosis not present

## 2017-10-21 LAB — SURGICAL PATHOLOGY

## 2017-11-16 DIAGNOSIS — E039 Hypothyroidism, unspecified: Secondary | ICD-10-CM | POA: Diagnosis not present

## 2017-11-16 DIAGNOSIS — E785 Hyperlipidemia, unspecified: Secondary | ICD-10-CM | POA: Diagnosis not present

## 2017-11-16 DIAGNOSIS — C73 Malignant neoplasm of thyroid gland: Secondary | ICD-10-CM | POA: Diagnosis not present

## 2017-11-23 DIAGNOSIS — M3500 Sicca syndrome, unspecified: Secondary | ICD-10-CM | POA: Diagnosis not present

## 2017-11-23 DIAGNOSIS — E785 Hyperlipidemia, unspecified: Secondary | ICD-10-CM | POA: Diagnosis not present

## 2017-11-23 DIAGNOSIS — N8111 Cystocele, midline: Secondary | ICD-10-CM | POA: Diagnosis not present

## 2017-11-23 DIAGNOSIS — E039 Hypothyroidism, unspecified: Secondary | ICD-10-CM | POA: Diagnosis not present

## 2017-11-23 DIAGNOSIS — E7800 Pure hypercholesterolemia, unspecified: Secondary | ICD-10-CM | POA: Diagnosis not present

## 2017-11-23 DIAGNOSIS — F411 Generalized anxiety disorder: Secondary | ICD-10-CM | POA: Diagnosis not present

## 2017-11-23 DIAGNOSIS — C73 Malignant neoplasm of thyroid gland: Secondary | ICD-10-CM | POA: Diagnosis not present

## 2017-11-23 DIAGNOSIS — K900 Celiac disease: Secondary | ICD-10-CM | POA: Diagnosis not present

## 2017-11-23 DIAGNOSIS — F445 Conversion disorder with seizures or convulsions: Secondary | ICD-10-CM | POA: Diagnosis not present

## 2017-11-24 ENCOUNTER — Ambulatory Visit: Payer: Self-pay | Admitting: Internal Medicine

## 2017-11-27 DIAGNOSIS — Z79899 Other long term (current) drug therapy: Secondary | ICD-10-CM | POA: Diagnosis not present

## 2017-11-27 DIAGNOSIS — F330 Major depressive disorder, recurrent, mild: Secondary | ICD-10-CM | POA: Diagnosis not present

## 2017-12-04 ENCOUNTER — Encounter: Payer: Self-pay | Admitting: Internal Medicine

## 2017-12-04 DIAGNOSIS — J309 Allergic rhinitis, unspecified: Secondary | ICD-10-CM | POA: Insufficient documentation

## 2017-12-04 DIAGNOSIS — R0602 Shortness of breath: Secondary | ICD-10-CM | POA: Insufficient documentation

## 2017-12-04 DIAGNOSIS — J449 Chronic obstructive pulmonary disease, unspecified: Secondary | ICD-10-CM | POA: Insufficient documentation

## 2017-12-08 ENCOUNTER — Ambulatory Visit (INDEPENDENT_AMBULATORY_CARE_PROVIDER_SITE_OTHER): Payer: Medicare Other | Admitting: Internal Medicine

## 2017-12-08 ENCOUNTER — Encounter: Payer: Self-pay | Admitting: Internal Medicine

## 2017-12-08 VITALS — BP 112/78 | HR 77 | Resp 16 | Ht 65.0 in | Wt 180.8 lb

## 2017-12-08 DIAGNOSIS — IMO0001 Reserved for inherently not codable concepts without codable children: Secondary | ICD-10-CM

## 2017-12-08 DIAGNOSIS — J4521 Mild intermittent asthma with (acute) exacerbation: Secondary | ICD-10-CM

## 2017-12-08 DIAGNOSIS — J3089 Other allergic rhinitis: Secondary | ICD-10-CM

## 2017-12-08 MED ORDER — MONTELUKAST SODIUM 10 MG PO TABS: 10.0000 mg | ORAL_TABLET | Freq: Every day | ORAL | 3 refills | Status: DC

## 2017-12-08 MED ORDER — ALBUTEROL SULFATE HFA 108 (90 BASE) MCG/ACT IN AERS: 2.0000 | INHALATION_SPRAY | Freq: Four times a day (QID) | RESPIRATORY_TRACT | 2 refills | Status: DC | PRN

## 2017-12-08 MED ORDER — DYMISTA 137-50 MCG/ACT NA SUSP: 1.0000 | Freq: Every day | NASAL | 6 refills | Status: DC

## 2017-12-08 MED ORDER — ALBUTEROL SULFATE (2.5 MG/3ML) 0.083% IN NEBU: 2.5000 mg | INHALATION_SOLUTION | Freq: Four times a day (QID) | RESPIRATORY_TRACT | 6 refills | Status: DC | PRN

## 2017-12-08 NOTE — Progress Notes (Signed)
[facility] [place], [place] [address]  Internal MEDICINE  Office Visit Note  Patient Name: Susan [name]  [id number]  [id number]  Date of Service: 12/24/2017  Chief Complaint  Patient presents with  . Asthma  . Allergic Rhinitis    Asthma  She complains of chest tightness, cough, shortness of breath and wheezing. The current episode started in the past 7 days. The cough is non-productive and hoarse. Associated symptoms include rhinorrhea. Pertinent negatives include no chest pain, sneezing or sore throat. Her symptoms are alleviated by nothing. She reports minimal improvement on treatment. Her past medical history is significant for asthma.     Current Medication: Outpatient Encounter Medications as of 12/08/2017  Medication Sig Note  . albuterol (PROVENTIL HFA;VENTOLIN HFA) 108 (90 Base) MCG/ACT inhaler Inhale 2 puffs into the lungs every 6 (six) hours as needed for wheezing or shortness of breath.   Marland Kitchen albuterol (PROVENTIL) (2.5 MG/3ML) 0.083% nebulizer solution Take 3 mLs (2.5 mg total) by nebulization every 6 (six) hours as needed for wheezing or shortness of breath.   . ALPRAZolam (XANAX) 0.5 MG tablet Take 0.5 mg by mouth 4 (four) times daily as needed. FOR ANXIETY   . amitriptyline (ELAVIL) 25 MG tablet Take 25 mg by mouth at bedtime.   . carisoprodol (SOMA) 350 MG tablet Take 350 mg by mouth at bedtime. Patient states that she takes 1/2 tablet nightly   . diclofenac sodium (VOLTAREN) 1 % GEL Apply topically.   . DYMISTA 137-50 MCG/ACT SUSP Place 1 spray into both nostrils daily.   Marland Kitchen eletriptan (RELPAX) 40 MG tablet Take 40 mg by mouth as needed for migraine. may repeat in 2 hours if necessary    . EPINEPHrine 0.3 mg/0.3 mL IJ SOAJ injection Inject 0.3 mg into the muscle once. 08/03/2017: PRN  . estradiol (CLIMARA - DOSED IN MG/24 HR) 0.1 mg/24hr patch Place 1 patch (0.1 mg total) once a week onto the skin. Monday   . ezetimibe (ZETIA) 10 MG tablet  Take 10 mg by mouth daily.   . hydroxychloroquine (PLAQUENIL) 200 MG tablet Take by mouth 2 (two) times daily.   . hyoscyamine (NULEV) 0.125 MG TBDP Place 0.125 mg under the tongue every 4 (four) hours as needed for bladder spasms.    . Lactobacillus (DIGESTIVE HEALTH PROBIOTIC PO) Take 1 tablet by mouth daily.   Marland Kitchen levothyroxine (SYNTHROID, LEVOTHROID) 112 MCG tablet Take 112 mcg by mouth every morning.    Marland Kitchen LINZESS 290 MCG CAPS capsule    . mometasone-formoterol (DULERA) 100-5 MCG/ACT AERO Inhale 2 puffs into the lungs 2 (two) times daily.   . montelukast (SINGULAIR) 10 MG tablet Take 1 tablet (10 mg total) by mouth daily.   . ondansetron (ZOFRAN ODT) 4 MG disintegrating tablet Take 1 tablet (4 mg total) by mouth every 8 (eight) hours as needed for nausea or vomiting.   . pantoprazole (PROTONIX) 40 MG tablet Take 40 mg by mouth daily.   . Polyethylene Glycol 3350 (PEG 3350) POWD AS DIRECTED FOR COLONIC PREP.   Marland Kitchen PRESCRIPTION MEDICATION 1 Syringe by Subdermal route once a week. Allergy shot   . sodium chloride (OCEAN) 0.65 % SOLN nasal spray Place 1 spray into both nostrils as needed for congestion.   . sodium chloride 0.9 % nebulizer solution Take 3 mLs by nebulization 3 (three) times daily.   . SUMAtriptan-naproxen (TREXIMET) 85-500 MG per tablet Take 1 tablet by mouth daily as needed for migraine.   Marland Kitchen  venlafaxine XR (EFFEXOR-XR) 150 MG 24 hr capsule Take 300 mg by mouth at bedtime.   . Vitamin D, Ergocalciferol, (DRISDOL) 50000 units CAPS capsule Take 50,000 Units by mouth every 7 (seven) days. Monday   . [DISCONTINUED] albuterol (PROVENTIL HFA;VENTOLIN HFA) 108 (90 BASE) MCG/ACT inhaler Inhale 2 puffs into the lungs every 6 (six) hours as needed for wheezing or shortness of breath.   . [DISCONTINUED] albuterol (PROVENTIL) (2.5 MG/3ML) 0.083% nebulizer solution Take 2.5 mg by nebulization every 6 (six) hours as needed for wheezing or shortness of breath.   . [DISCONTINUED] DYMISTA 137-50  MCG/ACT SUSP Place 1 spray into both nostrils daily.   . [DISCONTINUED] montelukast (SINGULAIR) 10 MG tablet Take 1 tablet (10 mg total) by mouth daily.   Marland Kitchen acetaminophen (TYLENOL) 500 MG tablet Take by mouth.   . Bisacodyl (DULCOLAX PO) Take by mouth.   . Calcium Carbonate Antacid 600 MG chewable tablet Chew by mouth.   . Dentifrices (BIOTENE DRY MOUTH DT) by Transmucosal route.   . doxycycline (VIBRA-TABS) 100 MG tablet    . famotidine (PEPCID) 10 MG tablet Take 20 mg by mouth.   . fenofibrate (TRICOR) 48 MG tablet    . Fluticasone Furoate (ARNUITY ELLIPTA) 100 MCG/ACT AEPB Inhale 2 puffs into the lungs every morning.   . gabapentin (NEURONTIN) 600 MG tablet Take by mouth.   . mupirocin ointment (BACTROBAN) 2 %    . Polyethyl Glycol-Propyl Glycol 0.4-0.3 % SOLN Apply to eye.   . Probiotic Product ([place]) Take by mouth.   . SIMETHICONE PO Take by mouth.   . SYNTHROID 137 MCG tablet    . Vaginal Lubricant (REPLENS) GEL Place vaginally.   . vitamin E 1000 UNIT capsule Take by mouth.   . [DISCONTINUED] fluticasone (FLONASE) 50 MCG/ACT nasal spray Place 2 sprays into both nostrils daily.    . [DISCONTINUED] magnesium citrate SOLN Take 1 Bottle by mouth as needed for mild constipation.    . [DISCONTINUED] Multiple Vitamins-Minerals (ADULT GUMMY PO) Take 2 tablets by mouth daily.    No facility-administered encounter medications on file as of 12/08/2017.     Surgical History: Past Surgical History:  Procedure Laterality Date  . ABDOMINAL HYSTERECTOMY  2006  . BLADDER REPAIR    . BREAST CYST ASPIRATION Left yrs ago  . CHOLECYSTECTOMY  1989  . COLONOSCOPY WITH ESOPHAGOGASTRODUODENOSCOPY (EGD)    . COLONOSCOPY WITH PROPOFOL N/A 08/03/2017   Procedure: COLONOSCOPY WITH PROPOFOL;  Surgeon: Lollie Sails, MD;  Location[facility] ENDOSCOPY;  Service: Endoscopy;  Laterality: N/A;  . ESOPHAGOGASTRODUODENOSCOPY N/A 10/07/2013   Procedure: ESOPHAGOGASTRODUODENOSCOPY (EGD);   Surgeon: Danie Binder, MD;  Location: [facility] ENDO SUITE;  Service: Endoscopy;  Laterality: N/A;  patient received heparin at 530am given phenergan 53m IV 30 minutes before  . FLEXIBLE BRONCHOSCOPY N/A 06/04/2016   Procedure: FLEXIBLE BRONCHOSCOPY;  Surgeon: SAllyne Gee MD;  Location: [facility] ORS;  Service: Pulmonary;  Laterality: N/A;  . INCONTINENCE SURGERY  2004  . NASAL SINUS SURGERY    . THYROID LOBECTOMY    . TOTAL THYROIDECTOMY      Medical History: Past Medical History:  Diagnosis Date  . Arthritis   . Asthma   . Asthmatic bronchitis   . Celiac disease   . Celiac sprue   . Chronic anxiety   . Complication of anesthesia    vomiting  . [place]   . Depression   . Environmental allergies   .  Family history of breast cancer 05/2014   MyRisk neg  . Fibromyalgia   . Gastritis 10/08/2013  . GERD (gastroesophageal reflux disease)   . Hyperlipemia   . Hyperlipidemia    Intolerant to statins  . Hypothyroidism (acquired)   . Increased risk of breast cancer 05/2014   IBIS=29.5%  . Migraine   . Migraines   . 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 Pancreatitis   . Sjogren's disease ([place])   . Spastic colon   . Thyroid cancer ([place]) 1990 and 1994   Total thyroidectomy with radioactive iodine.     Family History: Family History  Problem Relation Age of Onset  . Arthritis/Rheumatoid Mother        died in age 96s  . Breast cancer Mother 50  . Breast cancer Maternal Grandmother        40's  . Breast cancer Maternal Aunt        X 2. 50's  . Lung cancer Maternal Aunt   . Pancreatitis Neg Hx   . Colon cancer Neg Hx     Social History   Socioeconomic History  . Marital status: Married    Spouse name: Not on file  . Number of children: 0  . Years of education: Not on file  . Highest education level: Not on file  Social Needs  . Financial resource strain: Not on file  . Food insecurity - worry: Not on file  . Food insecurity -  inability: Not on file  . Transportation needs - medical: Not on file  . Transportation needs - non-medical: Not on file  Occupational History  . Not on file  Tobacco Use  . Smoking status: Never Smoker  . Smokeless tobacco: Never Used  Substance and Sexual Activity  . Alcohol use: No  . Drug use: No  . Sexual activity: Yes    Birth control/protection: Surgical  Other Topics Concern  . Not on file  Social History Narrative  . Not on file    Review of Systems  Constitutional: Negative for chills, fatigue and unexpected weight change.  HENT: Positive for rhinorrhea, sinus pressure and sinus pain. Negative for congestion, sneezing and sore throat.   Eyes: Negative for redness.  Respiratory: Positive for cough, chest tightness, shortness of breath and wheezing.   Cardiovascular: Negative for chest pain and palpitations.  Gastrointestinal: Negative for abdominal pain, constipation, diarrhea, nausea and vomiting.  Genitourinary: Negative for dysuria and frequency.  Musculoskeletal: Negative for arthralgias, back pain, joint swelling and neck pain.  Skin: Negative for rash.  Neurological: Negative.  Negative for tremors and numbness.  Hematological: Negative for adenopathy. Does not bruise/bleed easily.  Psychiatric/Behavioral: Negative for behavioral problems (Depression), sleep disturbance and suicidal ideas. The patient is not nervous/anxious.     Vital Signs: BP 112/78 (BP Location: Left Arm, Patient Position: Sitting)   Pulse 77   Resp 16   Ht 5' 5"  (1.651 m)   Wt 180 lb 12.8 oz (82 kg)   SpO2 97%   BMI 30.09 kg/m    Physical Exam  Constitutional: She appears well-developed and well-nourished. No distress.  HENT:  Head: Normocephalic and atraumatic.  Nose: Mucosal edema present. Right sinus exhibits frontal sinus tenderness. Left sinus exhibits frontal sinus tenderness.    Mouth/Throat: Oropharynx is clear and moist. No oropharyngeal exudate.  Eyes: Conjunctivae and  EOM are normal. Pupils are equal, round, and reactive to light.  Neck: Normal range of motion. Neck supple. No JVD present. No tracheal deviation present.  Cardiovascular: Normal rate, regular rhythm and normal heart sounds.  Pulmonary/Chest: Effort normal. No respiratory distress. She has wheezes. She has rales. She exhibits no tenderness.  Lymphadenopathy:    She has no cervical adenopathy.  Neurological: She is alert.  Skin: Skin is warm and dry. She is not diaphoretic.  Psychiatric: She has a normal mood and affect.   Assessment/Plan: 1. Moderate intermittent asthma with acute exacerbation - albuterol (PROVENTIL HFA;VENTOLIN HFA) 108 (90 Base) MCG/ACT inhaler; Inhale 2 puffs into the lungs every 6 (six) hours as needed for wheezing or shortness of breath.  Dispense: 1 Inhaler; Refill: 2 - albuterol (PROVENTIL) (2.5 MG/3ML) 0.083% nebulizer solution; Take 3 mLs (2.5 mg total) by nebulization every 6 (six) hours as needed for wheezing or shortness of breath.  Dispense: 75 mL; Refill: 6 - Spirometry with Graph  2. Non-seasonal allergic rhinitis, unspecified trigger - montelukast (SINGULAIR) 10 MG tablet; Take 1 tablet (10 mg total) by mouth daily.  Dispense: 90 tablet; Refill: 3 - DYMISTA 137-50 MCG/ACT SUSP; Place 1 spray into both nostrils daily.  Dispense: 1 Bottle; Refill: 6  General Counseling: anely spiewak understanding of the findings of todays visit and agrees with plan of treatment. I have discussed any further diagnostic evaluation that may be needed or ordered today. We also reviewed her medications today. she has been encouraged to call the office with any questions or concerns that should arise related to todays visit.   Orders Placed This Encounter  Procedures  . Spirometry with Graph    Meds ordered this encounter  Medications  . albuterol (PROVENTIL HFA;VENTOLIN HFA) 108 (90 Base) MCG/ACT inhaler    Sig: Inhale 2 puffs into the lungs every 6 (six) hours as needed  for wheezing or shortness of breath.    Dispense:  1 Inhaler    Refill:  2  . albuterol (PROVENTIL) (2.5 MG/3ML) 0.083% nebulizer solution    Sig: Take 3 mLs (2.5 mg total) by nebulization every 6 (six) hours as needed for wheezing or shortness of breath.    Dispense:  75 mL    Refill:  6  . montelukast (SINGULAIR) 10 MG tablet    Sig: Take 1 tablet (10 mg total) by mouth daily.    Dispense:  90 tablet    Refill:  3  . DYMISTA 137-50 MCG/ACT SUSP    Sig: Place 1 spray into both nostrils daily.    Dispense:  1 Bottle    Refill:  6    Time spent 25Minutes  Dr Lavera Guise Internal medicine

## 2017-12-17 DIAGNOSIS — Z86010 Personal history of colonic polyps: Secondary | ICD-10-CM | POA: Diagnosis not present

## 2017-12-17 DIAGNOSIS — K581 Irritable bowel syndrome with constipation: Secondary | ICD-10-CM | POA: Diagnosis not present

## 2018-01-01 DIAGNOSIS — J301 Allergic rhinitis due to pollen: Secondary | ICD-10-CM | POA: Diagnosis not present

## 2018-01-12 ENCOUNTER — Ambulatory Visit (INDEPENDENT_AMBULATORY_CARE_PROVIDER_SITE_OTHER): Payer: Medicare Other

## 2018-01-12 DIAGNOSIS — J301 Allergic rhinitis due to pollen: Secondary | ICD-10-CM

## 2018-01-25 DIAGNOSIS — G250 Essential tremor: Secondary | ICD-10-CM | POA: Diagnosis not present

## 2018-01-25 DIAGNOSIS — G43719 Chronic migraine without aura, intractable, without status migrainosus: Secondary | ICD-10-CM | POA: Diagnosis not present

## 2018-03-01 DIAGNOSIS — M180 Bilateral primary osteoarthritis of first carpometacarpal joints: Secondary | ICD-10-CM | POA: Insufficient documentation

## 2018-03-01 DIAGNOSIS — M25562 Pain in left knee: Secondary | ICD-10-CM

## 2018-03-01 DIAGNOSIS — M25561 Pain in right knee: Secondary | ICD-10-CM | POA: Diagnosis not present

## 2018-03-01 DIAGNOSIS — K900 Celiac disease: Secondary | ICD-10-CM | POA: Diagnosis not present

## 2018-03-01 DIAGNOSIS — G8929 Other chronic pain: Secondary | ICD-10-CM | POA: Insufficient documentation

## 2018-03-01 DIAGNOSIS — M3500 Sicca syndrome, unspecified: Secondary | ICD-10-CM | POA: Diagnosis not present

## 2018-03-01 DIAGNOSIS — Z0001 Encounter for general adult medical examination with abnormal findings: Secondary | ICD-10-CM | POA: Insufficient documentation

## 2018-03-16 DIAGNOSIS — E039 Hypothyroidism, unspecified: Secondary | ICD-10-CM | POA: Diagnosis not present

## 2018-03-16 DIAGNOSIS — M3500 Sicca syndrome, unspecified: Secondary | ICD-10-CM | POA: Diagnosis not present

## 2018-03-16 DIAGNOSIS — E7800 Pure hypercholesterolemia, unspecified: Secondary | ICD-10-CM | POA: Diagnosis not present

## 2018-03-16 DIAGNOSIS — M6080 Other myositis, unspecified site: Secondary | ICD-10-CM | POA: Diagnosis not present

## 2018-03-16 DIAGNOSIS — E785 Hyperlipidemia, unspecified: Secondary | ICD-10-CM | POA: Diagnosis not present

## 2018-03-16 DIAGNOSIS — C73 Malignant neoplasm of thyroid gland: Secondary | ICD-10-CM | POA: Diagnosis not present

## 2018-03-16 DIAGNOSIS — F411 Generalized anxiety disorder: Secondary | ICD-10-CM | POA: Diagnosis not present

## 2018-03-16 DIAGNOSIS — F445 Conversion disorder with seizures or convulsions: Secondary | ICD-10-CM | POA: Diagnosis not present

## 2018-03-16 DIAGNOSIS — K900 Celiac disease: Secondary | ICD-10-CM | POA: Diagnosis not present

## 2018-03-16 DIAGNOSIS — E559 Vitamin D deficiency, unspecified: Secondary | ICD-10-CM | POA: Diagnosis not present

## 2018-04-12 ENCOUNTER — Telehealth: Payer: Self-pay

## 2018-04-12 NOTE — Telephone Encounter (Signed)
Pt is having trouble w/Estradiol patch staying on. They keep falling off when getting in the bath tub or when she sweats. Pt requesting a different patch or another for of med. VF#[phone]

## 2018-04-13 MED ORDER — MINIVELLE 0.1 MG/24HR TD PTTW: 1.0000 | MEDICATED_PATCH | TRANSDERMAL | 12 refills | Status: DC

## 2018-04-13 NOTE — Telephone Encounter (Signed)
LMVM to notify pt.

## 2018-04-13 NOTE — Telephone Encounter (Signed)
Rx changed to [place] patch twice weekly, usually is thin and sticks better, to [facility] in [place] (on record pharmacy)

## 2018-04-21 DIAGNOSIS — M180 Bilateral primary osteoarthritis of first carpometacarpal joints: Secondary | ICD-10-CM | POA: Diagnosis not present

## 2018-05-25 DIAGNOSIS — F410 Panic disorder [episodic paroxysmal anxiety] without agoraphobia: Secondary | ICD-10-CM | POA: Diagnosis not present

## 2018-05-25 DIAGNOSIS — Z79899 Other long term (current) drug therapy: Secondary | ICD-10-CM | POA: Diagnosis not present

## 2018-06-11 DIAGNOSIS — F410 Panic disorder [episodic paroxysmal anxiety] without agoraphobia: Secondary | ICD-10-CM | POA: Diagnosis not present

## 2018-06-11 DIAGNOSIS — Z79899 Other long term (current) drug therapy: Secondary | ICD-10-CM | POA: Diagnosis not present

## 2018-06-14 ENCOUNTER — Other Ambulatory Visit: Payer: Self-pay | Admitting: Obstetrics & Gynecology

## 2018-06-25 DIAGNOSIS — K229 Disease of esophagus, unspecified: Secondary | ICD-10-CM | POA: Diagnosis not present

## 2018-06-25 DIAGNOSIS — E559 Vitamin D deficiency, unspecified: Secondary | ICD-10-CM | POA: Diagnosis not present

## 2018-06-25 DIAGNOSIS — K589 Irritable bowel syndrome without diarrhea: Secondary | ICD-10-CM | POA: Diagnosis not present

## 2018-06-25 DIAGNOSIS — M3500 Sicca syndrome, unspecified: Secondary | ICD-10-CM | POA: Diagnosis not present

## 2018-06-25 DIAGNOSIS — E039 Hypothyroidism, unspecified: Secondary | ICD-10-CM | POA: Diagnosis not present

## 2018-06-25 DIAGNOSIS — C73 Malignant neoplasm of thyroid gland: Secondary | ICD-10-CM | POA: Diagnosis not present

## 2018-06-25 DIAGNOSIS — E785 Hyperlipidemia, unspecified: Secondary | ICD-10-CM | POA: Diagnosis not present

## 2018-06-25 DIAGNOSIS — M797 Fibromyalgia: Secondary | ICD-10-CM | POA: Diagnosis not present

## 2018-06-25 DIAGNOSIS — F411 Generalized anxiety disorder: Secondary | ICD-10-CM | POA: Diagnosis not present

## 2018-06-25 DIAGNOSIS — F329 Major depressive disorder, single episode, unspecified: Secondary | ICD-10-CM | POA: Diagnosis not present

## 2018-06-25 DIAGNOSIS — K5730 Diverticulosis of large intestine without perforation or abscess without bleeding: Secondary | ICD-10-CM | POA: Diagnosis not present

## 2018-07-06 DIAGNOSIS — J45998 Other asthma: Secondary | ICD-10-CM | POA: Diagnosis not present

## 2018-07-06 DIAGNOSIS — K900 Celiac disease: Secondary | ICD-10-CM | POA: Diagnosis not present

## 2018-07-06 DIAGNOSIS — E559 Vitamin D deficiency, unspecified: Secondary | ICD-10-CM | POA: Diagnosis not present

## 2018-07-06 DIAGNOSIS — E7800 Pure hypercholesterolemia, unspecified: Secondary | ICD-10-CM | POA: Diagnosis not present

## 2018-07-06 DIAGNOSIS — E039 Hypothyroidism, unspecified: Secondary | ICD-10-CM | POA: Diagnosis not present

## 2018-07-06 DIAGNOSIS — M6080 Other myositis, unspecified site: Secondary | ICD-10-CM | POA: Diagnosis not present

## 2018-07-06 DIAGNOSIS — F411 Generalized anxiety disorder: Secondary | ICD-10-CM | POA: Diagnosis not present

## 2018-07-06 DIAGNOSIS — E785 Hyperlipidemia, unspecified: Secondary | ICD-10-CM | POA: Diagnosis not present

## 2018-07-06 DIAGNOSIS — C73 Malignant neoplasm of thyroid gland: Secondary | ICD-10-CM | POA: Diagnosis not present

## 2018-07-06 DIAGNOSIS — F445 Conversion disorder with seizures or convulsions: Secondary | ICD-10-CM | POA: Diagnosis not present

## 2018-07-06 DIAGNOSIS — Z6830 Body mass index (BMI) 30.0-30.9, adult: Secondary | ICD-10-CM | POA: Diagnosis not present

## 2018-07-06 DIAGNOSIS — M3500 Sicca syndrome, unspecified: Secondary | ICD-10-CM | POA: Diagnosis not present

## 2018-07-13 ENCOUNTER — Other Ambulatory Visit: Payer: Self-pay | Admitting: Internal Medicine

## 2018-07-13 DIAGNOSIS — J3089 Other allergic rhinitis: Secondary | ICD-10-CM

## 2018-07-21 DIAGNOSIS — G43719 Chronic migraine without aura, intractable, without status migrainosus: Secondary | ICD-10-CM | POA: Diagnosis not present

## 2018-07-21 DIAGNOSIS — G250 Essential tremor: Secondary | ICD-10-CM | POA: Diagnosis not present

## 2018-07-22 ENCOUNTER — Other Ambulatory Visit: Payer: Self-pay | Admitting: Obstetrics & Gynecology

## 2018-07-22 DIAGNOSIS — Z1231 Encounter for screening mammogram for malignant neoplasm of breast: Secondary | ICD-10-CM

## 2018-08-03 ENCOUNTER — Encounter: Admission: RE | Payer: Self-pay | Source: Ambulatory Visit

## 2018-08-03 ENCOUNTER — Ambulatory Visit: Admission: RE | Admit: 2018-08-03 | Payer: Medicare Other | Source: Ambulatory Visit | Admitting: Gastroenterology

## 2018-08-03 SURGERY — COLONOSCOPY WITH PROPOFOL
Anesthesia: General

## 2018-08-23 ENCOUNTER — Other Ambulatory Visit: Payer: Self-pay | Admitting: Internal Medicine

## 2018-08-30 DIAGNOSIS — G8929 Other chronic pain: Secondary | ICD-10-CM | POA: Diagnosis not present

## 2018-08-30 DIAGNOSIS — M3500 Sicca syndrome, unspecified: Secondary | ICD-10-CM | POA: Diagnosis not present

## 2018-08-30 DIAGNOSIS — M180 Bilateral primary osteoarthritis of first carpometacarpal joints: Secondary | ICD-10-CM | POA: Diagnosis not present

## 2018-08-31 ENCOUNTER — Ambulatory Visit: Payer: Medicare Other | Admitting: Obstetrics & Gynecology

## 2018-09-06 ENCOUNTER — Other Ambulatory Visit (HOSPITAL_COMMUNITY)
Admission: RE | Admit: 2018-09-06 | Discharge: 2018-09-06 | Disposition: A | Discharge status: Home / Self Care | Payer: Medicare Other | Source: Ambulatory Visit | Attending: Obstetrics & Gynecology | Admitting: Obstetrics & Gynecology

## 2018-09-06 ENCOUNTER — Ambulatory Visit (INDEPENDENT_AMBULATORY_CARE_PROVIDER_SITE_OTHER): Payer: Medicare Other | Admitting: Obstetrics & Gynecology

## 2018-09-06 ENCOUNTER — Encounter: Payer: Self-pay | Admitting: Obstetrics & Gynecology

## 2018-09-06 ENCOUNTER — Ambulatory Visit
Admission: RE | Admit: 2018-09-06 | Discharge: 2018-09-06 | Disposition: A | Discharge status: Home / Self Care | Payer: Medicare Other | Source: Ambulatory Visit | Attending: Obstetrics & Gynecology | Admitting: Obstetrics & Gynecology

## 2018-09-06 VITALS — BP 122/80 | Ht 65.0 in | Wt 182.0 lb

## 2018-09-06 DIAGNOSIS — Z0000 Encounter for general adult medical examination without abnormal findings: Secondary | ICD-10-CM

## 2018-09-06 DIAGNOSIS — R87622 Low grade squamous intraepithelial lesion on cytologic smear of vagina (LGSIL): Secondary | ICD-10-CM

## 2018-09-06 DIAGNOSIS — Z1231 Encounter for screening mammogram for malignant neoplasm of breast: Secondary | ICD-10-CM | POA: Insufficient documentation

## 2018-09-06 DIAGNOSIS — Z01419 Encounter for gynecological examination (general) (routine) without abnormal findings: Secondary | ICD-10-CM | POA: Diagnosis not present

## 2018-09-06 DIAGNOSIS — Z124 Encounter for screening for malignant neoplasm of cervix: Secondary | ICD-10-CM | POA: Diagnosis not present

## 2018-09-06 MED ORDER — MINIVELLE 0.1 MG/24HR TD PTTW: 1.0000 | MEDICATED_PATCH | TRANSDERMAL | 4 refills | Status: DC

## 2018-09-06 NOTE — Patient Instructions (Signed)
PAP every three years Mammogram every year Colonoscopy as planned Labs yearly (with PCP)

## 2018-09-06 NOTE — Progress Notes (Signed)
HPI:      [name] is a 62 y.o. G0P0000 who LMP was in the past, she presents today for her annual examination.  The patient has no complaints today. The patient is sexually active. Herlast pap: approximate date 2018 and was abnormal: LGSIL; followed by normal biopsy of vagina..  The patient does perform self breast exams.  There is notable family history of breast or ovarian cancer in her family. BRCA NEG. The patient is taking hormone replacement therapy. Patient denies post-menopausal vaginal bleeding.   The patient has regular exercise: yes. The patient denies current symptoms of depression.    GYN Hx: Last Colonoscopy:1 year ago. Normal.  Last DEXA: never ago.    PMHx: Past Medical History:  Diagnosis Date  . Arthritis   . Asthma   . Asthmatic bronchitis   . Celiac disease   . Celiac sprue   . Chronic anxiety   . Complication of anesthesia    vomiting  . COPD (chronic obstructive pulmonary disease) ([place])   . Depression   . Environmental allergies   . Family history of breast cancer 05/2014   MyRisk neg  . Fibromyalgia   . Gastritis 10/08/2013  . GERD (gastroesophageal reflux disease)   . Hyperlipemia   . Hyperlipidemia    Intolerant to statins  . Hypothyroidism (acquired)   . Increased risk of breast cancer 05/2014   IBIS=29.5%  . Migraine   . Migraines   . 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 Pancreatitis   . Sjogren's disease ([place])   . Spastic colon   . Thyroid cancer ([place]) 1990 and 1994   Total thyroidectomy with radioactive iodine.    Past Surgical History:  Procedure Laterality Date  . ABDOMINAL HYSTERECTOMY  2006  . BLADDER REPAIR    . BREAST CYST ASPIRATION Left yrs ago  . CHOLECYSTECTOMY  1989  . COLONOSCOPY WITH ESOPHAGOGASTRODUODENOSCOPY (EGD)    . COLONOSCOPY WITH PROPOFOL N/A 08/03/2017   Procedure: COLONOSCOPY WITH PROPOFOL;  Surgeon: Lollie Sails, MD;  Location[facility] ENDOSCOPY;  Service: Endoscopy;  Laterality: N/A;  .  ESOPHAGOGASTRODUODENOSCOPY N/A 10/07/2013   Procedure: ESOPHAGOGASTRODUODENOSCOPY (EGD);  Surgeon: Danie Binder, MD;  Location: [facility] ENDO SUITE;  Service: Endoscopy;  Laterality: N/A;  patient received heparin at 530am given phenergan 52m IV 30 minutes before  . FLEXIBLE BRONCHOSCOPY N/A 06/04/2016   Procedure: FLEXIBLE BRONCHOSCOPY;  Surgeon: SAllyne Gee MD;  Location: [facility] ORS;  Service: Pulmonary;  Laterality: N/A;  . INCONTINENCE SURGERY  2004  . NASAL SINUS SURGERY    . THYROID LOBECTOMY    . TOTAL THYROIDECTOMY     Family History  Problem Relation Age of Onset  . Arthritis/Rheumatoid Mother        died in age 9366s . Breast cancer Mother 446 . Breast cancer Maternal Grandmother        40's  . Breast cancer Maternal Aunt        X 2. 50's  . Lung cancer Maternal Aunt   . Pancreatitis Neg Hx   . Colon cancer Neg Hx    Social History   Tobacco Use  . Smoking status: Never Smoker  . Smokeless tobacco: Never Used  Substance Use Topics  . Alcohol use: No  . Drug use: No    Current Outpatient Medications:  .  acetaminophen (TYLENOL) 500 MG tablet, Take by mouth., Disp: , Rfl:  .  albuterol (PROVENTIL HFA;VENTOLIN HFA) 108 (90 Base) MCG/ACT inhaler, Inhale 2 puffs  into the lungs every 6 (six) hours as needed for wheezing or shortness of breath., Disp: 1 Inhaler, Rfl: 2 .  albuterol (PROVENTIL) (2.5 MG/3ML) 0.083% nebulizer solution, Take 3 mLs (2.5 mg total) by nebulization every 6 (six) hours as needed for wheezing or shortness of breath., Disp: 75 mL, Rfl: 6 .  ALPRAZolam (XANAX) 0.5 MG tablet, Take 0.5 mg by mouth 4 (four) times daily as needed. FOR ANXIETY, Disp: , Rfl:  .  amitriptyline (ELAVIL) 25 MG tablet, Take 25 mg by mouth at bedtime., Disp: , Rfl:  .  Bisacodyl (DULCOLAX PO), Take by mouth., Disp: , Rfl:  .  Calcium Carbonate Antacid 600 MG chewable tablet, Chew by mouth., Disp: , Rfl:  .  carisoprodol (SOMA) 350 MG tablet, Take 350 mg by mouth at bedtime. Patient  states that she takes 1/2 tablet nightly, Disp: , Rfl:  .  Dentifrices (BIOTENE DRY MOUTH DT), by Transmucosal route., Disp: , Rfl:  .  doxycycline (VIBRA-TABS) 100 MG tablet, , Disp: , Rfl: 1 .  DYMISTA 137-50 MCG/ACT SUSP, Place 1 spray into both nostrils daily., Disp: 1 Bottle, Rfl: 6 .  eletriptan (RELPAX) 40 MG tablet, Take 40 mg by mouth as needed for migraine. may repeat in 2 hours if necessary , Disp: , Rfl:  .  EPINEPHRINE 0.3 mg/0.3 mL IJ SOAJ injection, USE AS DIRECTED, Disp: 1 Device, Rfl: 2 .  ezetimibe (ZETIA) 10 MG tablet, Take 10 mg by mouth daily., Disp: , Rfl:  .  famotidine (PEPCID) 10 MG tablet, Take 20 mg by mouth., Disp: , Rfl:  .  fenofibrate (TRICOR) 48 MG tablet, , Disp: , Rfl: 0 .  Fluticasone Furoate (ARNUITY ELLIPTA) 100 MCG/ACT AEPB, Inhale 2 puffs into the lungs every morning., Disp: , Rfl:  .  gabapentin (NEURONTIN) 600 MG tablet, Take by mouth., Disp: , Rfl:  .  hydroxychloroquine (PLAQUENIL) 200 MG tablet, Take by mouth 2 (two) times daily., Disp: , Rfl:  .  hyoscyamine (NULEV) 0.125 MG TBDP, Place 0.125 mg under the tongue every 4 (four) hours as needed for bladder spasms. , Disp: , Rfl:  .  Lactobacillus (DIGESTIVE HEALTH PROBIOTIC PO), Take 1 tablet by mouth daily., Disp: , Rfl:  .  levothyroxine (SYNTHROID, LEVOTHROID) 112 MCG tablet, Take 112 mcg by mouth every morning. , Disp: , Rfl:  .  LINZESS 290 MCG CAPS capsule, , Disp: , Rfl: 1 .  MINIVELLE 0.1 MG/24HR patch, Place 1 patch (0.1 mg total) onto the skin 2 (two) times a week., Disp: 24 patch, Rfl: 4 .  mometasone-formoterol (DULERA) 100-5 MCG/ACT AERO, Inhale 2 puffs into the lungs 2 (two) times daily., Disp: , Rfl:  .  montelukast (SINGULAIR) 10 MG tablet, TAKE ONE TABLET BY MOUTH ONCE DAILY FOR ASTHMA, Disp: 30 tablet, Rfl: 5 .  mupirocin ointment (BACTROBAN) 2 %, , Disp: , Rfl: 0 .  ondansetron (ZOFRAN ODT) 4 MG disintegrating tablet, Take 1 tablet (4 mg total) by mouth every 8 (eight) hours as needed  for nausea or vomiting., Disp: 20 tablet, Rfl: 0 .  pantoprazole (PROTONIX) 40 MG tablet, Take 40 mg by mouth daily., Disp: , Rfl:  .  pilocarpine (SALAGEN) 5 MG tablet, Take 5 mg by mouth 3 (three) times daily., Disp: , Rfl: 11 .  Polyethyl Glycol-Propyl Glycol 0.4-0.3 % SOLN, Apply to eye., Disp: , Rfl:  .  Polyethylene Glycol 3350 (PEG 3350) POWD, AS DIRECTED FOR COLONIC PREP., Disp: , Rfl:  .  PRESCRIPTION MEDICATION, 1  Syringe by Subdermal route once a week. Allergy shot, Disp: , Rfl:  .  Probiotic Product ([place]), Take by mouth., Disp: , Rfl:  .  SIMETHICONE PO, Take by mouth., Disp: , Rfl:  .  sodium chloride (OCEAN) 0.65 % SOLN nasal spray, Place 1 spray into both nostrils as needed for congestion., Disp: , Rfl:  .  sodium chloride 0.9 % nebulizer solution, Take 3 mLs by nebulization 3 (three) times daily., Disp: , Rfl:  .  SUMAtriptan-naproxen (TREXIMET) 85-500 MG per tablet, Take 1 tablet by mouth daily as needed for migraine., Disp: , Rfl:  .  SYNTHROID 137 MCG tablet, , Disp: , Rfl: 0 .  Vaginal Lubricant (REPLENS) GEL, Place vaginally., Disp: , Rfl:  .  venlafaxine XR (EFFEXOR-XR) 150 MG 24 hr capsule, Take 300 mg by mouth at bedtime., Disp: , Rfl:  .  Vitamin D, Ergocalciferol, (DRISDOL) 50000 units CAPS capsule, Take 50,000 Units by mouth every 7 (seven) days. Monday, Disp: , Rfl:  .  vitamin E 1000 UNIT capsule, Take by mouth., Disp: , Rfl:  Allergies: Aspirin; Keflex [cephalexin]; Azithromycin; Barley grass; Clarithromycin; Metoclopramide; Oat; Oatmeal; Other; Rye grass flower pollen extract [gramineae pollens]; Vioxx [rofecoxib]; Wheat bran; Wheat extract; Celebrex [celecoxib]; and Metoclopramide hcl  Review of Systems  Constitutional: Negative for chills, fever and malaise/fatigue.  HENT: Negative for congestion, sinus pain and sore throat.   Eyes: Negative for blurred vision and pain.  Respiratory: Negative for cough and wheezing.   Cardiovascular:  Negative for chest pain and leg swelling.  Gastrointestinal: Negative for abdominal pain, constipation, diarrhea, heartburn, nausea and vomiting.  Genitourinary: Negative for dysuria, frequency, hematuria and urgency.  Musculoskeletal: Negative for back pain, joint pain, myalgias and neck pain.  Skin: Negative for itching and rash.  Neurological: Negative for dizziness, tremors and weakness.  Endo/Heme/Allergies: Does not bruise/bleed easily.  Psychiatric/Behavioral: Negative for depression. The patient is not nervous/anxious and does not have insomnia.     Objective: BP 122/80   Ht 5' 5"  (1.651 m)   Wt 182 lb (82.6 kg)   BMI 30.29 kg/m   Filed Weights   09/06/18 0832  Weight: 182 lb (82.6 kg)   Body mass index is 30.29 kg/m. Physical Exam  Constitutional: She is oriented to person, place, and time. She appears well-developed and well-nourished. No distress.  Genitourinary: Rectum normal and vagina normal. Pelvic exam was performed with patient supine. There is no rash or lesion on the right labia. There is no rash or lesion on the left labia. Vagina exhibits no lesion. No bleeding in the vagina. Right adnexum does not display mass and does not display tenderness. Left adnexum does not display mass and does not display tenderness.  Genitourinary Comments: Absent Uterus Absent cervix Vaginal cuff well healed Atrophy present.  Shortened vagina.  No cystocele.  HENT:  Head: Normocephalic and atraumatic. Head is without laceration.  Right Ear: Hearing normal.  Left Ear: Hearing normal.  Nose: No epistaxis.  No foreign bodies.  Mouth/Throat: Uvula is midline, oropharynx is clear and moist and mucous membranes are normal.  Eyes: Pupils are equal, round, and reactive to light.  Neck: Normal range of motion. Neck supple. No thyromegaly present.  Cardiovascular: Normal rate and regular rhythm. Exam reveals no gallop and no friction rub.  No murmur heard. Pulmonary/Chest: Effort normal  and breath sounds normal. No respiratory distress. She has no wheezes. Right breast exhibits no mass, no skin change and no tenderness. Left breast exhibits  no mass, no skin change and no tenderness.  Abdominal: Soft. Bowel sounds are normal. She exhibits no distension. There is no tenderness. There is no rebound.  Musculoskeletal: Normal range of motion.  Neurological: She is alert and oriented to person, place, and time. No cranial nerve deficit.  Skin: Skin is warm and dry.  Psychiatric: She has a normal mood and affect. Judgment normal.  Vitals reviewed.   Assessment: Annual Exam 1. 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 Annual physical exam    Plan:            1.  Vaginal Screening-  Pap smear done today as was LGSIL last year (normal biopsy)  2. Breast screening- Exam annually and mammogram scheduled  3. Colonoscopy every few years as arranged w GI (Dr Donnella Sham), Hemoccult testing after age 45  4. Labs managed by PCP  5. Counseling for hormonal therapy: no change in therapy today, desires to cont w HRT patch. HRT I have discussed HRT with the patient in detail.  The risk/benefits of it were reviewed.  She understands that during menopause Estrogen decreases dramatically and that this results in an increased risk of cardiovascular disease as well as osteoporosis.  We have also discussed the fact that hot flashes often result from a decrease in Estrogen, and that by replacing Estrogen, they can often be alleviated.  We have discussed skin, vaginal and urinary tract changes that may also take place from this drop in Estrogen.  Emotional changes have also been linked to Estrogen and we have briefly discussed this.  The benefits of HRT including decrease in hot flashes, vaginal dryness, and osteoporosis were discussed.  The emotional benefit and a possible change in her cardiovascular risk profile was also reviewed.  The risks associated with Hormone  Replacement Therapy were also reviewed.  The use of unopposed Estrogen and its relationship to endometrial cancer was discussed.  The addition of Progesterone and its beneficial effect on endometrial cancer was also noted.  The fact that there has been no consistent definitive studies showing an increase in breast cancer in women who use HRT was discussed with the patient.  The possible side effects including breast tenderness, fluid retention, mood changes and vaginal bleeding were discussed.  The patient was informed that this is an elective medication and that she may choose not to take Hormone Replacement Therapy.  Literature on HRT was given, and I believe that after answering all of the patient's questions, she has an adequate and informed understanding of HRT.  Special emphasis on the WHI study, as well as several studies since that pertaining to the risks and benefits of estrogen replacement therapy were compared.  The possible limitations of these studies were discussed including the age stratification of the WHI study.  The possible role of Progesterone in these studies was discussed in detail.  I believe that the patient has an informed knowledge of the risks and benefits of HRT.  I have specifically discussed WHI findings and current updates.  Different type of hormone formulation and methods of taking hormone replacement therapy discussed.     F/U  Return in about 1 year (around 09/07/2019) for Annual.  Barnett Applebaum, MD, [facility], [facility] 09/06/2018  8:55 AM

## 2018-09-06 NOTE — Addendum Note (Signed)
Addended by: Gae Dry on: 09/06/2018 03:09 PM   Modules accepted: Orders

## 2018-09-07 ENCOUNTER — Ambulatory Visit (INDEPENDENT_AMBULATORY_CARE_PROVIDER_SITE_OTHER): Payer: Medicare Other | Admitting: Internal Medicine

## 2018-09-07 ENCOUNTER — Encounter: Payer: Self-pay | Admitting: Internal Medicine

## 2018-09-07 VITALS — BP 130/80 | HR 83 | Resp 16 | Ht 65.0 in | Wt 183.0 lb

## 2018-09-07 DIAGNOSIS — J4520 Mild intermittent asthma, uncomplicated: Secondary | ICD-10-CM | POA: Diagnosis not present

## 2018-09-07 DIAGNOSIS — K219 Gastro-esophageal reflux disease without esophagitis: Secondary | ICD-10-CM | POA: Diagnosis not present

## 2018-09-07 DIAGNOSIS — M3500 Sicca syndrome, unspecified: Secondary | ICD-10-CM | POA: Diagnosis not present

## 2018-09-07 DIAGNOSIS — K581 Irritable bowel syndrome with constipation: Secondary | ICD-10-CM | POA: Diagnosis not present

## 2018-09-07 DIAGNOSIS — D369 Benign neoplasm, unspecified site: Secondary | ICD-10-CM | POA: Diagnosis not present

## 2018-09-07 DIAGNOSIS — J309 Allergic rhinitis, unspecified: Secondary | ICD-10-CM

## 2018-09-07 DIAGNOSIS — R0602 Shortness of breath: Secondary | ICD-10-CM | POA: Diagnosis not present

## 2018-09-07 NOTE — Progress Notes (Signed)
[facility] [place], [place] [address]  Pulmonary Sleep Medicine   Office Visit Note  Patient Name: Susan [name] DOB: [birth year] MRN [id number]  Date of Service: 09/07/2018  Complaints/HPI: Patient is here for follow-up generally she is been doing well.  She states that she is getting her allergy shots as prescribed.  She has been on maintenance therapy and this is been helping her.  No hospital admissions.  As far as her Sjogren's is concerned she has some issues with continuing to lose teeth because of her oral dryness.  She is been following with a dentist and replacing her teeth as they follow.  She has some issues with arthritis also she wears wrist splints for that purpose.  No cough no congestion today but she does have some occasionally mostly related to the reflux.  ROS  General: (-) fever, (-) chills, (-) night sweats, (-) weakness Skin: (-) rashes, (-) itching,. Eyes: (-) visual changes, (-) redness, (-) itching. Nose and Sinuses: (-) nasal stuffiness or itchiness, (-) postnasal drip, (-) nosebleeds, (-) sinus trouble. Mouth and Throat: (-) sore throat, (-) hoarseness. Neck: (-) swollen glands, (-) enlarged thyroid, (-) neck pain. Respiratory: + cough, (-) bloody sputum, + shortness of breath, - wheezing. Cardiovascular: - ankle swelling, (-) chest pain. Lymphatic: (-) lymph node enlargement. Neurologic: (-) numbness, (-) tingling. Psychiatric: (-) anxiety, (-) depression   Current Medication: Outpatient Encounter Medications as of 09/07/2018  Medication Sig  . acetaminophen (TYLENOL) 500 MG tablet Take by mouth.  Marland Kitchen albuterol (PROVENTIL HFA;VENTOLIN HFA) 108 (90 Base) MCG/ACT inhaler Inhale 2 puffs into the lungs every 6 (six) hours as needed for wheezing or shortness of breath.  Marland Kitchen albuterol (PROVENTIL) (2.5 MG/3ML) 0.083% nebulizer solution Take 3 mLs (2.5 mg total) by nebulization every 6 (six) hours as needed for wheezing or shortness of  breath.  . ALPRAZolam (XANAX) 0.5 MG tablet Take 0.5 mg by mouth 4 (four) times daily as needed. FOR ANXIETY  . amitriptyline (ELAVIL) 25 MG tablet Take 25 mg by mouth at bedtime.  . Bisacodyl (DULCOLAX PO) Take by mouth.  . Calcium Carbonate Antacid 600 MG chewable tablet Chew by mouth.  . carisoprodol (SOMA) 350 MG tablet Take 350 mg by mouth at bedtime. Patient states that she takes 1/2 tablet nightly  . Dentifrices (BIOTENE DRY MOUTH DT) by Transmucosal route.  . doxycycline (VIBRA-TABS) 100 MG tablet   . DYMISTA 137-50 MCG/ACT SUSP Place 1 spray into both nostrils daily.  Marland Kitchen eletriptan (RELPAX) 40 MG tablet Take 40 mg by mouth as needed for migraine. may repeat in 2 hours if necessary   . EPINEPHRINE 0.3 mg/0.3 mL IJ SOAJ injection USE AS DIRECTED  . ezetimibe (ZETIA) 10 MG tablet Take 10 mg by mouth daily.  . famotidine (PEPCID) 10 MG tablet Take 20 mg by mouth.  . fenofibrate (TRICOR) 48 MG tablet   . Fluticasone Furoate (ARNUITY ELLIPTA) 100 MCG/ACT AEPB Inhale 2 puffs into the lungs every morning.  . gabapentin (NEURONTIN) 600 MG tablet Take by mouth.  . hydroxychloroquine (PLAQUENIL) 200 MG tablet Take by mouth 2 (two) times daily.  . hyoscyamine (NULEV) 0.125 MG TBDP Place 0.125 mg under the tongue every 4 (four) hours as needed for bladder spasms.   . Lactobacillus (DIGESTIVE HEALTH PROBIOTIC PO) Take 1 tablet by mouth daily.  Marland Kitchen levothyroxine (SYNTHROID, LEVOTHROID) 112 MCG tablet Take 112 mcg by mouth every morning.   Marland Kitchen LINZESS 290 MCG CAPS capsule   .  MINIVELLE 0.1 MG/24HR patch Place 1 patch (0.1 mg total) onto the skin 2 (two) times a week.  . mometasone-formoterol (DULERA) 100-5 MCG/ACT AERO Inhale 2 puffs into the lungs 2 (two) times daily.  . montelukast (SINGULAIR) 10 MG tablet TAKE ONE TABLET BY MOUTH ONCE DAILY FOR ASTHMA  . mupirocin ointment (BACTROBAN) 2 %   . ondansetron (ZOFRAN ODT) 4 MG disintegrating tablet Take 1 tablet (4 mg total) by mouth every 8 (eight) hours  as needed for nausea or vomiting.  . pantoprazole (PROTONIX) 40 MG tablet Take 40 mg by mouth daily.  . pilocarpine (SALAGEN) 5 MG tablet Take 5 mg by mouth 3 (three) times daily.  Vladimir Faster Glycol-Propyl Glycol 0.4-0.3 % SOLN Apply to eye.  . Polyethylene Glycol 3350 (PEG 3350) POWD AS DIRECTED FOR COLONIC PREP.  Marland Kitchen PRESCRIPTION MEDICATION 1 Syringe by Subdermal route once a week. Allergy shot  . Probiotic Product (Delta) Take by mouth.  . SIMETHICONE PO Take by mouth.  . sodium chloride (OCEAN) 0.65 % SOLN nasal spray Place 1 spray into both nostrils as needed for congestion.  . sodium chloride 0.9 % nebulizer solution Take 3 mLs by nebulization 3 (three) times daily.  . SUMAtriptan-naproxen (TREXIMET) 85-500 MG per tablet Take 1 tablet by mouth daily as needed for migraine.  Marland Kitchen SYNTHROID 137 MCG tablet   . Vaginal Lubricant (REPLENS) GEL Place vaginally.  . venlafaxine XR (EFFEXOR-XR) 150 MG 24 hr capsule Take 300 mg by mouth at bedtime.  . Vitamin D, Ergocalciferol, (DRISDOL) 50000 units CAPS capsule Take 50,000 Units by mouth every 7 (seven) days. Monday  . vitamin E 1000 UNIT capsule Take by mouth.   No facility-administered encounter medications on file as of 09/07/2018.     Surgical History: Past Surgical History:  Procedure Laterality Date  . ABDOMINAL HYSTERECTOMY  2006  . BLADDER REPAIR    . BREAST CYST ASPIRATION Left yrs ago  . CHOLECYSTECTOMY  1989  . COLONOSCOPY WITH ESOPHAGOGASTRODUODENOSCOPY (EGD)    . COLONOSCOPY WITH PROPOFOL N/A 08/03/2017   Procedure: COLONOSCOPY WITH PROPOFOL;  Surgeon: Lollie Sails, MD;  Location: [facility] ENDOSCOPY;  Service: Endoscopy;  Laterality: N/A;  . ESOPHAGOGASTRODUODENOSCOPY N/A 10/07/2013   Procedure: ESOPHAGOGASTRODUODENOSCOPY (EGD);  Surgeon: Danie Binder, MD;  Location: [facility] ENDO SUITE;  Service: Endoscopy;  Laterality: N/A;  patient received heparin at 530am given phenergan 50m IV 30 minutes before  .  FLEXIBLE BRONCHOSCOPY N/A 06/04/2016   Procedure: FLEXIBLE BRONCHOSCOPY;  Surgeon: SAllyne Gee MD;  Location: [facility] ORS;  Service: Pulmonary;  Laterality: N/A;  . INCONTINENCE SURGERY  2004  . NASAL SINUS SURGERY    . THYROID LOBECTOMY    . TOTAL THYROIDECTOMY      Medical History: Past Medical History:  Diagnosis Date  . Arthritis   . Asthma   . Asthmatic bronchitis   . Celiac disease   . Celiac sprue   . Chronic anxiety   . Complication of anesthesia    vomiting  . [place]   . Depression   . Environmental allergies   . Family history of breast cancer 05/2014   MyRisk neg  . Fibromyalgia   . Gastritis 10/08/2013  . GERD (gastroesophageal reflux disease)   . Hyperlipemia   . Hyperlipidemia    Intolerant to statins  . Hypothyroidism (acquired)   . Increased risk of breast cancer 05/2014   IBIS=29.5%  . Migraine   . Migraines   .  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 Pancreatitis   . Sjogren's disease ([place])   . Spastic colon   . Thyroid cancer ([place]) 1990 and 1994   Total thyroidectomy with radioactive iodine.     Family History: Family History  Problem Relation Age of Onset  . Arthritis/Rheumatoid Mother        died in age 38s  . Breast cancer Mother 53  . Breast cancer Maternal Grandmother        40's  . Breast cancer Maternal Aunt        X 2. 50's  . Lung cancer Maternal Aunt   . Pancreatitis Neg Hx   . Colon cancer Neg Hx     Social History: Social History   Socioeconomic History  . Marital status: Married    Spouse name: Not on file  . Number of children: 0  . Years of education: Not on file  . Highest education level: Not on file  Occupational History  . Not on file  Social Needs  . Financial resource strain: Not on file  . Food insecurity:    Worry: Not on file    Inability: Not on file  . Transportation needs:    Medical: Not on file    Non-medical: Not on file  Tobacco Use  . Smoking status:  Never Smoker  . Smokeless tobacco: Never Used  Substance and Sexual Activity  . Alcohol use: No  . Drug use: No  . Sexual activity: Yes    Birth control/protection: Surgical  Lifestyle  . Physical activity:    Days per week: Not on file    Minutes per session: Not on file  . Stress: Not on file  Relationships  . Social connections:    Talks on phone: Not on file    Gets together: Not on file    Attends religious service: Not on file    Active member of club or organization: Not on file    Attends meetings of clubs or organizations: Not on file    Relationship status: Not on file  . Intimate partner violence:    Fear of current or ex partner: Not on file    Emotionally abused: Not on file    Physically abused: Not on file    Forced sexual activity: Not on file  Other Topics Concern  . Not on file  Social History Narrative  . Not on file    Vital Signs: Blood pressure 130/80, pulse 83, resp. rate 16, height 5' 5"  (1.651 m), weight 183 lb (83 kg), SpO2 98 %.  Examination: General Appearance: The patient is well-developed, well-nourished, and in no distress. Skin: Gross inspection of skin unremarkable. Head: normocephalic, no gross deformities. Eyes: no gross deformities noted. ENT: ears appear grossly normal no exudates. Neck: Supple. No thyromegaly. No LAD. Respiratory: no rhonchi noted. Cardiovascular: Normal S1 and S2 without murmur or rub. Extremities: No cyanosis. pulses are equal. Neurologic: Alert and oriented. No involuntary movements.  LABS: No results found for this or any previous visit (from the past 2160 hour(s)).  Radiology: Mm 3d Screen Breast Bilateral  Result Date: 09/06/2018 CLINICAL DATA:  Screening. EXAM: DIGITAL SCREENING BILATERAL MAMMOGRAM WITH TOMO AND CAD COMPARISON:  Previous exam(s). ACR Breast Density Category b: There are scattered areas of fibroglandular density. FINDINGS: There are no findings suspicious for malignancy. Images were  processed with CAD. IMPRESSION: No mammographic evidence of malignancy. A result letter of this screening mammogram will be mailed directly to the patient. RECOMMENDATION:  Screening mammogram in one year. (Code:SM-B-01Y) BI-RADS CATEGORY  1: Negative. Electronically Signed   By: Lajean Manes M.D.   On: 09/06/2018 15:14    Mm 3d Screen Breast Bilateral  Result Date: 09/06/2018 CLINICAL DATA:  Screening. EXAM: DIGITAL SCREENING BILATERAL MAMMOGRAM WITH TOMO AND CAD COMPARISON:  Previous exam(s). ACR Breast Density Category b: There are scattered areas of fibroglandular density. FINDINGS: There are no findings suspicious for malignancy. Images were processed with CAD. IMPRESSION: No mammographic evidence of malignancy. A result letter of this screening mammogram will be mailed directly to the patient. RECOMMENDATION: Screening mammogram in one year. (Code:SM-B-01Y) BI-RADS CATEGORY  1: Negative. Electronically Signed   By: Lajean Manes M.D.   On: 09/06/2018 15:14    Mm 3d Screen Breast Bilateral  Result Date: 09/06/2018 CLINICAL DATA:  Screening. EXAM: DIGITAL SCREENING BILATERAL MAMMOGRAM WITH TOMO AND CAD COMPARISON:  Previous exam(s). ACR Breast Density Category b: There are scattered areas of fibroglandular density. FINDINGS: There are no findings suspicious for malignancy. Images were processed with CAD. IMPRESSION: No mammographic evidence of malignancy. A result letter of this screening mammogram will be mailed directly to the patient. RECOMMENDATION: Screening mammogram in one year. (Code:SM-B-01Y) BI-RADS CATEGORY  1: Negative. Electronically Signed   By: Lajean Manes M.D.   On: 09/06/2018 15:14      Assessment and Plan: Patient Active Problem List   Diagnosis Date Noted  . Chronic obstructive pulmonary disease, unspecified ([place]) 12/04/2017  . Shortness of breath 12/04/2017  . Allergic rhinitis, unspecified 12/04/2017  . Low grade squamous intraepith lesion on cytologic smear vagina  (lgsil) 09/09/2017  . Vaginal atrophy 09/01/2017  . Chronic right-sided low back pain without sciatica 01/05/2017  . Primary Sjogren's syndrome ([place]) 01/05/2017  . CD (celiac disease) 04/17/2015  . Gastroesophageal reflux disease with esophagitis 04/17/2015  . Hx of Sjogren's disease ([place]) 04/17/2015  . Acute asthma exacerbation 10/14/2013  . Acute respiratory failure ([place]) 10/14/2013  . Gastritis 10/08/2013  . Bradycardia 10/07/2013  . Esophageal spasm 10/06/2013  . Substernal precordial chest pain 10/06/2013  . Chronic anxiety 10/06/2013  . GERD (gastroesophageal reflux disease) 10/06/2013  . Asthmatic bronchitis 10/06/2013  . 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 Depression with anxiety 10/06/2013  . Fibromyalgia 10/06/2013  . Hypothyroidism (acquired) 10/06/2013  . Chronic migraine 10/06/2013    1. Chronic Asthma right now is controlled we will continue with present management supportive care.  Patient has been doing well with her sister is feeling better since she is been getting her allergy shots. 2. Allergic Rhinitis on allergy shots this will be continued.  We will continue with supportive care. 3. GERD controlled at this time 4. Sjogrens syndrome progressive disease follow-up with her primary rheumatologist.  Continue with supportive care  General Counseling: I have discussed the findings of the evaluation and examination with Santiago Glad.  I have also discussed any further diagnostic evaluation thatmay be needed or ordered today. Abeeha verbalizes understanding of the findings of todays visit. We also reviewed her medications today and discussed drug interactions and side effects including but not limited excessive drowsiness and altered mental states. We also discussed that there is always a risk not just to her but also people around her. she has been encouraged to call the office with any questions or concerns that should arise related to todays visit.    Time spent: 74mn  I  have personally obtained a history, examined the patient, evaluated laboratory and imaging results, formulated the assessment and plan and  placed orders.    Allyne Gee, MD [facility] Pulmonary and Critical Care Sleep medicine

## 2018-09-07 NOTE — Patient Instructions (Signed)

## 2018-09-09 DIAGNOSIS — J301 Allergic rhinitis due to pollen: Secondary | ICD-10-CM | POA: Diagnosis not present

## 2018-09-09 LAB — CYTOLOGY - PAP
Diagnosis: NEGATIVE
HPV (WINDOPATH): NOT DETECTED

## 2018-09-13 ENCOUNTER — Encounter: Payer: Self-pay | Admitting: Obstetrics and Gynecology

## 2018-09-27 ENCOUNTER — Encounter: Payer: Self-pay | Admitting: Adult Health

## 2018-09-27 ENCOUNTER — Ambulatory Visit (INDEPENDENT_AMBULATORY_CARE_PROVIDER_SITE_OTHER): Payer: Medicare Other | Admitting: Adult Health

## 2018-09-27 VITALS — BP 122/82 | HR 85 | Temp 98.2°F | Ht 65.0 in | Wt 187.0 lb

## 2018-09-27 DIAGNOSIS — J0110 Acute frontal sinusitis, unspecified: Secondary | ICD-10-CM | POA: Diagnosis not present

## 2018-09-27 DIAGNOSIS — R059 Cough, unspecified: Secondary | ICD-10-CM

## 2018-09-27 DIAGNOSIS — R05 Cough: Secondary | ICD-10-CM | POA: Diagnosis not present

## 2018-09-27 DIAGNOSIS — J4520 Mild intermittent asthma, uncomplicated: Secondary | ICD-10-CM

## 2018-09-27 DIAGNOSIS — J309 Allergic rhinitis, unspecified: Secondary | ICD-10-CM | POA: Diagnosis not present

## 2018-09-27 MED ORDER — HYDROCOD POLST-CPM POLST ER 10-8 MG/5ML PO SUER: 5.0000 mL | Freq: Two times a day (BID) | ORAL | 0 refills | Status: DC | PRN

## 2018-09-27 MED ORDER — AMOXICILLIN-POT CLAVULANATE 875-125 MG PO TABS: 1.0000 | ORAL_TABLET | Freq: Two times a day (BID) | ORAL | 0 refills | Status: DC

## 2018-09-27 NOTE — Progress Notes (Signed)
[facility] [place],  [address]  Internal MEDICINE  Office Visit Note  Patient Name: Susan [name]  [id number]  [id number]  Date of Service: 09/28/2018  Chief Complaint  Patient presents with  . Sinusitis  . Cough  . Laryngitis     HPI Pt is here for a sick visit.  Patient describes sinus pain and pressure that has been ongoing for over a week.  She reports that she has had postnasal drainage and a sore throat however these throat has resolved.  She has now lost her voice and has some component of laryngitis.  She reports that she was febrile at some point although she did not take her temperature she did feel chills and had sweating episodes.     Current Medication:  Outpatient Encounter Medications as of 09/27/2018  Medication Sig  . acetaminophen (TYLENOL) 500 MG tablet Take by mouth.  Marland Kitchen albuterol (PROVENTIL HFA;VENTOLIN HFA) 108 (90 Base) MCG/ACT inhaler Inhale 2 puffs into the lungs every 6 (six) hours as needed for wheezing or shortness of breath.  Marland Kitchen albuterol (PROVENTIL) (2.5 MG/3ML) 0.083% nebulizer solution Take 3 mLs (2.5 mg total) by nebulization every 6 (six) hours as needed for wheezing or shortness of breath.  . ALPRAZolam (XANAX) 0.5 MG tablet Take 0.5 mg by mouth 4 (four) times daily as needed. FOR ANXIETY  . amitriptyline (ELAVIL) 25 MG tablet Take 25 mg by mouth at bedtime.  . Bisacodyl (DULCOLAX PO) Take by mouth.  . Calcium Carbonate Antacid 600 MG chewable tablet Chew by mouth.  . carisoprodol (SOMA) 350 MG tablet Take 350 mg by mouth at bedtime. Patient states that she takes 1/2 tablet nightly  . Dentifrices (BIOTENE DRY MOUTH DT) by Transmucosal route.  . DYMISTA 137-50 MCG/ACT SUSP Place 1 spray into both nostrils daily.  Marland Kitchen eletriptan (RELPAX) 40 MG tablet Take 40 mg by mouth as needed for migraine. may repeat in 2 hours if necessary   . EPINEPHRINE 0.3 mg/0.3 mL IJ SOAJ injection USE AS DIRECTED  . ezetimibe (ZETIA)  10 MG tablet Take 10 mg by mouth daily.  . famotidine (PEPCID) 10 MG tablet Take 20 mg by mouth.  . fenofibrate (TRICOR) 48 MG tablet   . Fluticasone Furoate (ARNUITY ELLIPTA) 100 MCG/ACT AEPB Inhale 2 puffs into the lungs every morning.  . gabapentin (NEURONTIN) 600 MG tablet Take by mouth.  . hydroxychloroquine (PLAQUENIL) 200 MG tablet Take by mouth 2 (two) times daily.  . hyoscyamine (NULEV) 0.125 MG TBDP Place 0.125 mg under the tongue every 4 (four) hours as needed for bladder spasms.   . Lactobacillus (DIGESTIVE HEALTH PROBIOTIC PO) Take 1 tablet by mouth daily.  Marland Kitchen levothyroxine (SYNTHROID, LEVOTHROID) 112 MCG tablet Take 112 mcg by mouth every morning.   Marland Kitchen LINZESS 290 MCG CAPS capsule   . MINIVELLE 0.1 MG/24HR patch Place 1 patch (0.1 mg total) onto the skin 2 (two) times a week.  . mometasone-formoterol (DULERA) 100-5 MCG/ACT AERO Inhale 2 puffs into the lungs 2 (two) times daily.  . montelukast (SINGULAIR) 10 MG tablet TAKE ONE TABLET BY MOUTH ONCE DAILY FOR ASTHMA  . mupirocin ointment (BACTROBAN) 2 %   . ondansetron (ZOFRAN ODT) 4 MG disintegrating tablet Take 1 tablet (4 mg total) by mouth every 8 (eight) hours as needed for nausea or vomiting.  . pantoprazole (PROTONIX) 40 MG tablet Take 40 mg by mouth daily.  . pilocarpine (SALAGEN) 5 MG tablet Take 5 mg by mouth 3 (three)  times daily.  Vladimir Faster Glycol-Propyl Glycol 0.4-0.3 % SOLN Apply to eye.  . Polyethylene Glycol 3350 (PEG 3350) POWD AS DIRECTED FOR COLONIC PREP.  Marland Kitchen PRESCRIPTION MEDICATION 1 Syringe by Subdermal route once a week. Allergy shot  . Probiotic Product (Gargatha) Take by mouth.  . SIMETHICONE PO Take by mouth.  . sodium chloride (OCEAN) 0.65 % SOLN nasal spray Place 1 spray into both nostrils as needed for congestion.  . sodium chloride 0.9 % nebulizer solution Take 3 mLs by nebulization 3 (three) times daily.  . SUMAtriptan-naproxen (TREXIMET) 85-500 MG per tablet Take 1 tablet by mouth daily  as needed for migraine.  Marland Kitchen SYNTHROID 137 MCG tablet   . Vaginal Lubricant (REPLENS) GEL Place vaginally.  . venlafaxine XR (EFFEXOR-XR) 150 MG 24 hr capsule Take 300 mg by mouth at bedtime.  . Vitamin D, Ergocalciferol, (DRISDOL) 50000 units CAPS capsule Take 50,000 Units by mouth every 7 (seven) days. Monday  . vitamin E 1000 UNIT capsule Take by mouth.  Marland Kitchen amoxicillin-clavulanate (AUGMENTIN) 875-125 MG tablet Take 1 tablet by mouth 2 (two) times daily.  . chlorpheniramine-HYDROcodone (TUSSIONEX PENNKINETIC ER) 10-8 MG/5ML SUER Take 5 mLs by mouth every 12 (twelve) hours as needed for cough.  . doxycycline (VIBRA-TABS) 100 MG tablet    No facility-administered encounter medications on file as of 09/27/2018.       Medical History: Past Medical History:  Diagnosis Date  . Arthritis   . Asthma   . Asthmatic bronchitis   . BRCA negative 05/2014   MyRisk neg  . Celiac disease   . Celiac sprue   . Chronic anxiety   . Complication of anesthesia    vomiting  . COPD (chronic obstructive pulmonary disease) ([place])   . Depression   . Environmental allergies   . Family history of breast cancer 05/2014   MyRisk neg; IBIS=29.5%  . Fibromyalgia   . Gastritis 10/08/2013  . GERD (gastroesophageal reflux disease)   . Hyperlipemia   . Hyperlipidemia    Intolerant to statins  . Hypothyroidism (acquired)   . Increased risk of breast cancer 05/2014   IBIS=29.5%  . Increased risk of breast cancer 05/2014   IBIS=29.5%  . Migraine   . Migraines   . 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 Pancreatitis   . Sjogren's disease ([place])   . Spastic colon   . Thyroid cancer ([place]) 1990 and 1994   Total thyroidectomy with radioactive iodine.      Vital Signs: BP 122/82 (BP Location: Left Arm, Patient Position: Sitting, Cuff Size: Normal)   Pulse 85   Temp 98.2 F (36.8 C) (Oral)   Ht 5' 5"  (1.651 m)   Wt 187 lb (84.8 kg)   SpO2 98%   BMI 31.12 kg/m    Review of Systems  Constitutional:  Negative for chills, fatigue and unexpected weight change.  HENT: Negative for congestion, rhinorrhea, sneezing and sore throat.   Eyes: Negative for photophobia, pain and redness.  Respiratory: Negative for cough, chest tightness and shortness of breath.   Cardiovascular: Negative for chest pain and palpitations.  Gastrointestinal: Negative for abdominal pain, constipation, diarrhea, nausea and vomiting.  Endocrine: Negative.   Genitourinary: Negative for dysuria and frequency.  Musculoskeletal: Negative for arthralgias, back pain, joint swelling and neck pain.  Skin: Negative for rash.  Allergic/Immunologic: Negative.   Neurological: Negative for tremors and numbness.  Hematological: Negative for adenopathy. Does not bruise/bleed easily.  Psychiatric/Behavioral: Negative for behavioral problems and sleep disturbance.  The patient is not nervous/anxious.     Physical Exam  Constitutional: She is oriented to person, place, and time. She appears well-developed and well-nourished. No distress.  HENT:  Head: Normocephalic and atraumatic.  Mouth/Throat: Oropharynx is clear and moist. No oropharyngeal exudate.  Eyes: Pupils are equal, round, and reactive to light. EOM are normal.  Neck: Normal range of motion. Neck supple. No JVD present. No tracheal deviation present. No thyromegaly present.  Cardiovascular: Normal rate, regular rhythm and normal heart sounds. Exam reveals no gallop and no friction rub.  No murmur heard. Pulmonary/Chest: Effort normal and breath sounds normal. No respiratory distress. She has no wheezes. She has no rales. She exhibits no tenderness.  Abdominal: Soft. There is no tenderness. There is no guarding.  Musculoskeletal: Normal range of motion.  Lymphadenopathy:    She has no cervical adenopathy.  Neurological: She is alert and oriented to person, place, and time. No cranial nerve deficit.  Skin: Skin is warm and dry. She is not diaphoretic.  Psychiatric: She has  a normal mood and affect. Her behavior is normal. Judgment and thought content normal.  Nursing note and vitals reviewed.  Assessment/Plan: 1. Acute non-recurrent frontal sinusitis Patient treated for sinusitis we will give a course of Augmentin.  Instructed patient to take medicine with food. - amoxicillin-clavulanate (AUGMENTIN) 875-125 MG tablet; Take 1 tablet by mouth 2 (two) times daily.  Dispense: 20 tablet; Refill: 0  2. Cough Patient reports cough is worse at night and she is having difficulty sleeping.  She feels like if she can rest she may could get over this.  Patient will have prescription for Tussionex at the pharmacy.  Discussed patient should not drive or operate machinery while taking this medication.  Also encouraged her to only take this medication at night before bed. - chlorpheniramine-HYDROcodone (TUSSIONEX PENNKINETIC ER) 10-8 MG/5ML SUER; Take 5 mLs by mouth every 12 (twelve) hours as needed for cough.  Dispense: 115 mL; Refill: 0 Reviewed risks and possible side effects associated with taking opiates, benzodiazepines and other CNS depressants. Combination of these could cause dizziness and drowsiness. Advised patient not to drive or operate machinery when taking these medications, as patient's and other's life can be at risk and will have consequences. Patient verbalized understanding in this matter. Dependence and abuse for these drugs will be monitored closely. A Controlled substance policy and procedure is on file which allows [facility] to order a urine drug screen test at any visit. Patient understands and agrees with the plan  3. Allergic rhinitis, unspecified seasonality, unspecified trigger Patient remains with minor symptoms of allergic rhinitis.  I encouraged her to continue taking her over-the-counter medications for symptom management.  4. Mild intermittent chronic asthma without complication Stable, continue current medication management.  General  Counseling: Susan [name] understanding of the findings of todays visit and agrees with plan of treatment. I have discussed any further diagnostic evaluation that may be needed or ordered today. We also reviewed her medications today. she has been encouraged to call the office with any questions or concerns that should arise related to todays visit.   No orders of the defined types were placed in this encounter.   Meds ordered this encounter  Medications  . amoxicillin-clavulanate (AUGMENTIN) 875-125 MG tablet    Sig: Take 1 tablet by mouth 2 (two) times daily.    Dispense:  20 tablet    Refill:  0  . chlorpheniramine-HYDROcodone (TUSSIONEX PENNKINETIC ER) 10-8 MG/5ML SUER  Sig: Take 5 mLs by mouth every 12 (twelve) hours as needed for cough.    Dispense:  115 mL    Refill:  0    Time spent: 25 Minutes  This patient was seen by Orson Gear AGNP-C in Collaboration with Dr Lavera Guise as a part of collaborative care agreement.  Kendell Bane AGNP-C Internal Medicine

## 2018-09-27 NOTE — Patient Instructions (Signed)

## 2018-10-14 ENCOUNTER — Telehealth: Payer: Self-pay

## 2018-10-14 NOTE — Telephone Encounter (Signed)
Pt calling for pap results.  [phone]  Pt aware of neg pap results.

## 2018-10-18 ENCOUNTER — Ambulatory Visit: Payer: Medicare Other | Admitting: Certified Registered Nurse Anesthetist

## 2018-10-18 ENCOUNTER — Ambulatory Visit
Admission: RE | Admit: 2018-10-18 | Discharge: 2018-10-18 | Disposition: A | Discharge status: Home / Self Care | Payer: Medicare Other | Source: Ambulatory Visit | Attending: Gastroenterology | Admitting: Gastroenterology

## 2018-10-18 ENCOUNTER — Encounter
Admission: RE | Disposition: A | Discharge status: Home / Self Care | Payer: Self-pay | Source: Ambulatory Visit | Attending: Gastroenterology

## 2018-10-18 ENCOUNTER — Encounter: Payer: Self-pay | Admitting: *Deleted

## 2018-10-18 DIAGNOSIS — Z7951 Long term (current) use of inhaled steroids: Secondary | ICD-10-CM | POA: Insufficient documentation

## 2018-10-18 DIAGNOSIS — Z09 Encounter for follow-up examination after completed treatment for conditions other than malignant neoplasm: Secondary | ICD-10-CM | POA: Diagnosis not present

## 2018-10-18 DIAGNOSIS — Z86010 Personal history of colonic polyps: Secondary | ICD-10-CM | POA: Insufficient documentation

## 2018-10-18 DIAGNOSIS — K219 Gastro-esophageal reflux disease without esophagitis: Secondary | ICD-10-CM | POA: Diagnosis not present

## 2018-10-18 DIAGNOSIS — Z881 Allergy status to other antibiotic agents status: Secondary | ICD-10-CM | POA: Insufficient documentation

## 2018-10-18 DIAGNOSIS — K900 Celiac disease: Secondary | ICD-10-CM | POA: Insufficient documentation

## 2018-10-18 DIAGNOSIS — Q438 Other specified congenital malformations of intestine: Secondary | ICD-10-CM | POA: Insufficient documentation

## 2018-10-18 DIAGNOSIS — Z79899 Other long term (current) drug therapy: Secondary | ICD-10-CM | POA: Diagnosis not present

## 2018-10-18 DIAGNOSIS — E785 Hyperlipidemia, unspecified: Secondary | ICD-10-CM | POA: Diagnosis not present

## 2018-10-18 DIAGNOSIS — Z791 Long term (current) use of non-steroidal anti-inflammatories (NSAID): Secondary | ICD-10-CM | POA: Diagnosis not present

## 2018-10-18 DIAGNOSIS — F329 Major depressive disorder, single episode, unspecified: Secondary | ICD-10-CM | POA: Insufficient documentation

## 2018-10-18 DIAGNOSIS — G629 Polyneuropathy, unspecified: Secondary | ICD-10-CM | POA: Diagnosis not present

## 2018-10-18 DIAGNOSIS — K621 Rectal polyp: Secondary | ICD-10-CM | POA: Insufficient documentation

## 2018-10-18 DIAGNOSIS — K8681 Exocrine pancreatic insufficiency: Secondary | ICD-10-CM | POA: Diagnosis not present

## 2018-10-18 DIAGNOSIS — K589 Irritable bowel syndrome without diarrhea: Secondary | ICD-10-CM | POA: Insufficient documentation

## 2018-10-18 DIAGNOSIS — G43909 Migraine, unspecified, not intractable, without status migrainosus: Secondary | ICD-10-CM | POA: Diagnosis not present

## 2018-10-18 DIAGNOSIS — D128 Benign neoplasm of rectum: Secondary | ICD-10-CM | POA: Diagnosis not present

## 2018-10-18 DIAGNOSIS — E890 Postprocedural hypothyroidism: Secondary | ICD-10-CM | POA: Diagnosis not present

## 2018-10-18 DIAGNOSIS — K635 Polyp of colon: Secondary | ICD-10-CM | POA: Diagnosis not present

## 2018-10-18 DIAGNOSIS — Z1211 Encounter for screening for malignant neoplasm of colon: Secondary | ICD-10-CM | POA: Diagnosis not present

## 2018-10-18 DIAGNOSIS — M1990 Unspecified osteoarthritis, unspecified site: Secondary | ICD-10-CM | POA: Insufficient documentation

## 2018-10-18 DIAGNOSIS — D122 Benign neoplasm of ascending colon: Secondary | ICD-10-CM | POA: Diagnosis not present

## 2018-10-18 DIAGNOSIS — M3500 Sicca syndrome, unspecified: Secondary | ICD-10-CM | POA: Diagnosis not present

## 2018-10-18 DIAGNOSIS — E039 Hypothyroidism, unspecified: Secondary | ICD-10-CM | POA: Diagnosis not present

## 2018-10-18 DIAGNOSIS — Z85850 Personal history of malignant neoplasm of thyroid: Secondary | ICD-10-CM | POA: Diagnosis not present

## 2018-10-18 DIAGNOSIS — Z79890 Hormone replacement therapy (postmenopausal): Secondary | ICD-10-CM | POA: Diagnosis not present

## 2018-10-18 DIAGNOSIS — M797 Fibromyalgia: Secondary | ICD-10-CM | POA: Diagnosis not present

## 2018-10-18 DIAGNOSIS — J449 Chronic obstructive pulmonary disease, unspecified: Secondary | ICD-10-CM | POA: Diagnosis not present

## 2018-10-18 DIAGNOSIS — Z886 Allergy status to analgesic agent status: Secondary | ICD-10-CM | POA: Insufficient documentation

## 2018-10-18 HISTORY — PX: COLONOSCOPY WITH PROPOFOL: SHX5780

## 2018-10-18 SURGERY — COLONOSCOPY WITH PROPOFOL
Anesthesia: General

## 2018-10-18 MED ORDER — PROPOFOL 500 MG/50ML IV EMUL
INTRAVENOUS | Status: AC
  Filled 2018-10-18: qty 50

## 2018-10-18 MED ORDER — LIDOCAINE HCL (CARDIAC) PF 100 MG/5ML IV SOSY
PREFILLED_SYRINGE | INTRAVENOUS | Status: DC | PRN
  Administered 2018-10-18: 50 mg via INTRAVENOUS

## 2018-10-18 MED ORDER — PROPOFOL 10 MG/ML IV BOLUS
INTRAVENOUS | Status: DC | PRN
  Administered 2018-10-18: 21 mg via INTRAVENOUS
  Administered 2018-10-18: 80 mg via INTRAVENOUS

## 2018-10-18 MED ORDER — SODIUM CHLORIDE 0.9 % IV SOLN
INTRAVENOUS | Status: DC
  Administered 2018-10-18: 09:00:00 via INTRAVENOUS

## 2018-10-18 MED ORDER — LIDOCAINE HCL (PF) 2 % IJ SOLN
INTRAMUSCULAR | Status: AC
  Filled 2018-10-18: qty 10

## 2018-10-18 MED ORDER — PROPOFOL 500 MG/50ML IV EMUL
INTRAVENOUS | Status: DC | PRN
  Administered 2018-10-18: 135 ug/kg/min via INTRAVENOUS

## 2018-10-18 NOTE — Op Note (Signed)
[facility] Gastroenterology Patient Name: Susan [name] Procedure Date: 10/18/2018 8:57 AM MRN: [id number] Account #: [id number] Date of Birth: Mar 29, 1956 Admit Type: Outpatient Age: 62 Room: [facility] ENDO ROOM 1 Gender: Female Note Status: Finalized Procedure:            Colonoscopy Indications:          Personal history of colonic polyps Providers:            Lollie Sails, MD Medicines:            Monitored Anesthesia Care Complications:        No immediate complications. Procedure:            Pre-Anesthesia Assessment:                       - ASA Grade Assessment: III - A patient with severe                        systemic disease.                       After obtaining informed consent, the colonoscope was                        passed under direct vision. Throughout the procedure,                        the patient's blood pressure, pulse, and oxygen                        saturations were monitored continuously. The                        Colonoscope was introduced through the anus and                        advanced to the the cecum, identified by appendiceal                        orifice and ileocecal valve. The quality of the bowel                        preparation was good. Findings:      The sigmoid colon, descending colon and transverse colon were       significantly redundant.      A 3 mm polyp was found in the proximal ascending colon. The polyp was       sessile. The polyp was removed with a cold biopsy forceps. Resection and       retrieval were complete.      A 1 mm polyp was found in the rectum. The polyp was sessile. The polyp       was removed with a cold biopsy forceps. Resection and retrieval were       complete.      The digital rectal exam was normal, note posterior midline scaring from       a previous fissure, well healed. I did not retroflex in the rectal vault       due to its narrowness, however multiple passes showed this to  be       otherwise normal. Impression:           -  Redundant colon.                       - One 3 mm polyp in the proximal ascending colon,                        removed with a cold biopsy forceps. Resected and                        retrieved.                       - One 1 mm polyp in the rectum, removed with a cold                        biopsy forceps. Resected and retrieved. Recommendation:       - Discharge patient to home.                       - Telephone GI clinic for pathology results in 1 week. Lollie Sails, MD 10/18/2018 9:54:15 AM This report has been signed electronically. Number of Addenda: 0 Note Initiated On: 10/18/2018 8:57 AM Scope Withdrawal Time: 0 hours 14 minutes 18 seconds  Total Procedure Duration: 0 hours 31 minutes 20 seconds       [facility]

## 2018-10-18 NOTE — Anesthesia Preprocedure Evaluation (Signed)
Anesthesia Evaluation  Patient identified by MRN, date of birth, ID band Patient awake    Reviewed: Allergy & Precautions, H&P , NPO status , Patient's Chart, lab work & pertinent test results, reviewed documented beta blocker date and time   History of Anesthesia Complications (+) history of anesthetic complications  Airway Mallampati: II   Neck ROM: full    Dental  (+) Poor Dentition   Pulmonary shortness of breath and with exertion, asthma , COPD,    Pulmonary exam normal        Cardiovascular Exercise Tolerance: Good negative cardio ROS Normal cardiovascular exam Rhythm:regular Rate:Normal     Neuro/Psych  Headaches, PSYCHIATRIC DISORDERS Anxiety Depression  Neuromuscular disease    GI/Hepatic Neg liver ROS, GERD  ,  Endo/Other  Hypothyroidism   Renal/GU negative Renal ROS  negative genitourinary   Musculoskeletal   Abdominal   Peds  Hematology negative hematology ROS (+)   Anesthesia Other Findings Past Medical History: No date: Arthritis No date: Asthma No date: Asthmatic bronchitis 05/2014: BRCA negative     Comment:  MyRisk neg No date: Celiac disease No date: Celiac sprue No date: Chronic anxiety No date: Complication of anesthesia     Comment:  vomiting No date: COPD (chronic obstructive pulmonary disease) ([facility]) No date: Depression No date: Environmental allergies 05/2014: Family history of breast cancer     Comment:  MyRisk neg; IBIS=29.5% No date: Fibromyalgia     Comment:  neuropathy all over 10/08/2013: Gastritis No date: GERD (gastroesophageal reflux disease) No date: Hyperlipemia No date: Hyperlipidemia     Comment:  Intolerant to statins No date: Hypothyroidism (acquired) 05/2014: Increased risk of breast cancer     Comment:  IBIS=29.5% 05/2014: Increased risk of breast cancer     Comment:  IBIS=29.5% No date: Migraine No date: Migraines 10/06/2013: Nutcracker esophagus No  date: Pancreatitis No date: Sjogren's disease ([place]) No date: Spastic colon 1990 and 1994: Thyroid cancer ([place])     Comment:  Total thyroidectomy with radioactive iodine.  Past Surgical History: 2006: ABDOMINAL HYSTERECTOMY No date: BLADDER REPAIR yrs ago: BREAST CYST ASPIRATION; Left 1989: CHOLECYSTECTOMY No date: COLONOSCOPY WITH ESOPHAGOGASTRODUODENOSCOPY (EGD) 08/03/2017: COLONOSCOPY WITH PROPOFOL; N/A     Comment:  Procedure: COLONOSCOPY WITH PROPOFOL;  Surgeon:               Lollie Sails, MD;  Location: [facility] ENDOSCOPY;                Service: Endoscopy;  Laterality: N/A; 10/07/2013: ESOPHAGOGASTRODUODENOSCOPY; N/A     Comment:  Procedure: ESOPHAGOGASTRODUODENOSCOPY (EGD);  Surgeon:               Danie Binder, MD;  Location: [facility] ENDO SUITE;  Service:               Endoscopy;  Laterality: N/A;  patient received heparin at              530am given phenergan 59m IV 30 minutes before No date: EYE SURGERY     Comment:  lacrimal gland  06/04/2016: FLEXIBLE BRONCHOSCOPY; N/A     Comment:  Procedure: [place]  Surgeon: SAllyne Gee MD;  Location: [facility] ORS;  Service: Pulmonary;                Laterality: N/A; 2004: INCONTINENCE SURGERY No date: NASAL SINUS SURGERY No date: THYROID LOBECTOMY No date: TOTAL THYROIDECTOMY BMI  Body Mass Index:  30.79 kg/m     Reproductive/Obstetrics negative OB ROS                             Anesthesia Physical Anesthesia Plan  ASA: III  Anesthesia Plan: General   Post-op Pain Management:    Induction:   PONV Risk Score and Plan:   Airway Management Planned:   Additional Equipment:   Intra-op Plan:   Post-operative Plan:   Informed Consent: I have reviewed the patients History and Physical, chart, labs and discussed the procedure including the risks, benefits and alternatives for the proposed anesthesia with the patient or authorized representative who has indicated  his/her understanding and acceptance.   Dental Advisory Given  Plan Discussed with: CRNA  Anesthesia Plan Comments:         Anesthesia Quick Evaluation

## 2018-10-18 NOTE — Transfer of Care (Signed)
Immediate Anesthesia Transfer of Care Note  Patient: Susan [name]  Procedure(s) Performed: COLONOSCOPY WITH PROPOFOL (N/A )  Patient Location: PACU and Endoscopy Unit  Anesthesia Type:General  Level of Consciousness: awake, alert , oriented and patient cooperative  Airway & Oxygen Therapy: Patient Spontanous Breathing  Post-op Assessment: Report given to RN and Post -op Vital signs reviewed and stable  Post vital signs: Reviewed and stable  Last Vitals:  Vitals Value Taken Time  BP    Temp    Pulse 74 10/18/2018  9:52 AM  Resp 11 10/18/2018  9:52 AM  SpO2 100 % 10/18/2018  9:52 AM  Vitals shown include unvalidated device data.  Last Pain:  Vitals:   10/18/18 0839  TempSrc: Tympanic         Complications: No apparent anesthesia complications

## 2018-10-18 NOTE — Anesthesia Post-op Follow-up Note (Signed)
Anesthesia QCDR form completed.        

## 2018-10-18 NOTE — H&P (Signed)
Outpatient short stay form Pre-procedure 10/18/2018 8:59 AM Lollie Sails MD  Primary Physician: Dr. Hipolito Bayley  Reason for visit: Colonoscopy  History of present illness: Patient is a 62 year old female presenting today for colonoscopy.  She has a personal history of adenomatous colon polyps.  Patient has a history of her last colonoscopy being 08/03/2017.  At that time there was a multiple tubular adenomas 1 of these was called is having high-grade dysplasia.  However on departmental review this was amended high-grade dysplasia.  Patient is presenting today for follow-up.  She tolerated her prep well.  She has been taking Linzess that has been very beneficial for her bowel habits.  She takes no aspirin or blood thinning agent.    Current Facility-Administered Medications:  .  0.9 %  sodium chloride infusion, , Intravenous, Continuous, Lollie Sails, MD, Last Rate: 20 mL/hr at 10/18/18 0849  Medications Prior to Admission  Medication Sig Dispense Refill Last Dose  . albuterol (PROVENTIL HFA;VENTOLIN HFA) 108 (90 Base) MCG/ACT inhaler Inhale 2 puffs into the lungs every 6 (six) hours as needed for wheezing or shortness of breath. 1 Inhaler 2 Past Month at Unknown time  . ALPRAZolam (XANAX) 0.5 MG tablet Take 0.5 mg by mouth 4 (four) times daily as needed. FOR ANXIETY   10/17/2018 at Unknown time  . amitriptyline (ELAVIL) 25 MG tablet Take 25 mg by mouth at bedtime.   10/17/2018 at Unknown time  . carisoprodol (SOMA) 350 MG tablet Take 350 mg by mouth at bedtime. Patient states that she takes 1/2 tablet nightly   10/17/2018 at Unknown time  . eletriptan (RELPAX) 40 MG tablet Take 40 mg by mouth as needed for migraine. may repeat in 2 hours if necessary    Past Month at Unknown time  . mometasone-formoterol (DULERA) 100-5 MCG/ACT AERO Inhale 2 puffs into the lungs 2 (two) times daily.   10/18/2018 at Unknown time  . pantoprazole (PROTONIX) 40 MG tablet Take 40 mg by mouth daily.    10/17/2018 at Unknown time  . SYNTHROID 137 MCG tablet   0 10/17/2018 at Unknown time  . Vitamin D, Ergocalciferol, (DRISDOL) 50000 units CAPS capsule Take 50,000 Units by mouth every 7 (seven) days. Monday   Past Week at Unknown time  . vitamin E 1000 UNIT capsule Take by mouth.   Past Week at Unknown time  . acetaminophen (TYLENOL) 500 MG tablet Take by mouth.   Taking  . albuterol (PROVENTIL) (2.5 MG/3ML) 0.083% nebulizer solution Take 3 mLs (2.5 mg total) by nebulization every 6 (six) hours as needed for wheezing or shortness of breath. 75 mL 6 Taking  . amoxicillin-clavulanate (AUGMENTIN) 875-125 MG tablet Take 1 tablet by mouth 2 (two) times daily. (Patient not taking: Reported on 10/18/2018) 20 tablet 0 Completed Course at Unknown time  . Bisacodyl (DULCOLAX PO) Take by mouth.   Taking  . Calcium Carbonate Antacid 600 MG chewable tablet Chew by mouth.   Taking  . chlorpheniramine-HYDROcodone (TUSSIONEX PENNKINETIC ER) 10-8 MG/5ML SUER Take 5 mLs by mouth every 12 (twelve) hours as needed for cough. 115 mL 0   . Dentifrices (BIOTENE DRY MOUTH DT) by Transmucosal route.   Taking  . doxycycline (VIBRA-TABS) 100 MG tablet   1 Not Taking  . DYMISTA 137-50 MCG/ACT SUSP Place 1 spray into both nostrils daily. 1 Bottle 6 Taking  . EPINEPHRINE 0.3 mg/0.3 mL IJ SOAJ injection USE AS DIRECTED 1 Device 2 Taking  . ezetimibe (ZETIA) 10 MG tablet  Take 10 mg by mouth daily.   Taking  . famotidine (PEPCID) 10 MG tablet Take 20 mg by mouth.   Taking  . fenofibrate (TRICOR) 145 MG tablet   0 Taking  . Fluticasone Furoate (ARNUITY ELLIPTA) 100 MCG/ACT AEPB Inhale 2 puffs into the lungs every morning.   Taking  . gabapentin (NEURONTIN) 600 MG tablet Take by mouth.   Taking  . hydroxychloroquine (PLAQUENIL) 200 MG tablet Take by mouth 2 (two) times daily.   Taking  . hyoscyamine (NULEV) 0.125 MG TBDP Place 0.125 mg under the tongue every 4 (four) hours as needed for bladder spasms.    Taking  . Lactobacillus  (DIGESTIVE HEALTH PROBIOTIC PO) Take 1 tablet by mouth daily.   Taking  . levothyroxine (SYNTHROID, LEVOTHROID) 112 MCG tablet Take 112 mcg by mouth every morning.    Taking  . LINZESS 290 MCG CAPS capsule   1 Taking  . MINIVELLE 0.1 MG/24HR patch Place 1 patch (0.1 mg total) onto the skin 2 (two) times a week. 24 patch 4 Taking  . montelukast (SINGULAIR) 10 MG tablet TAKE ONE TABLET BY MOUTH ONCE DAILY FOR ASTHMA 30 tablet 5 Taking  . mupirocin ointment (BACTROBAN) 2 %   0 Taking  . ondansetron (ZOFRAN ODT) 4 MG disintegrating tablet Take 1 tablet (4 mg total) by mouth every 8 (eight) hours as needed for nausea or vomiting. 20 tablet 0 Taking  . pilocarpine (SALAGEN) 5 MG tablet Take 5 mg by mouth 3 (three) times daily.  11 Not Taking at Unknown time  . Polyethyl Glycol-Propyl Glycol 0.4-0.3 % SOLN Apply to eye.   Taking  . Polyethylene Glycol 3350 (PEG 3350) POWD AS DIRECTED FOR COLONIC PREP.   Taking  . PRESCRIPTION MEDICATION 1 Syringe by Subdermal route once a week. Allergy shot   Taking  . Probiotic Product (PHILLIPS COLON HEALTH PO) Take by mouth.   Taking  . SIMETHICONE PO Take by mouth.   Taking  . sodium chloride (OCEAN) 0.65 % SOLN nasal spray Place 1 spray into both nostrils as needed for congestion.   Taking  . sodium chloride 0.9 % nebulizer solution Take 3 mLs by nebulization 3 (three) times daily.   Taking  . SUMAtriptan-naproxen (TREXIMET) 85-500 MG per tablet Take 1 tablet by mouth daily as needed for migraine.   Taking  . Vaginal Lubricant (REPLENS) GEL Place vaginally.   Taking  . venlafaxine XR (EFFEXOR-XR) 150 MG 24 hr capsule Take 300 mg by mouth at bedtime.   Taking     Allergies  Allergen Reactions  . Aspirin   . Keflex [Cephalexin] Shortness Of Breath  . Azithromycin Hives and Nausea Only  . Barley Grass   . Clarithromycin Other (See Comments)  . Metoclopramide Other (See Comments)    TREMORS  . Oat Other (See Comments)  . Oatmeal Other (See Comments)  .  Other     Other reaction(s): Other (See Comments) KEOPLEX.  . Rye Grass Flower Pollen Extract [Gramineae Pollens]   . Vioxx [Rofecoxib] Diarrhea  . Wheat Bran Other (See Comments)    Celiac disease  . Wheat Extract   . Celebrex [Celecoxib] Hives  . Metoclopramide Hcl Anxiety    BODY TREMORS     Past Medical History:  Diagnosis Date  . Arthritis   . Asthma   . Asthmatic bronchitis   . BRCA negative 05/2014   MyRisk neg  . Celiac disease   . Celiac sprue   . Chronic anxiety   .   Complication of anesthesia    vomiting  . COPD (chronic obstructive pulmonary disease) ([place])   . Depression   . Environmental allergies   . Family history of breast cancer 05/2014   MyRisk neg; IBIS=29.5%  . Fibromyalgia    neuropathy all over  . Gastritis 10/08/2013  . GERD (gastroesophageal reflux disease)   . Hyperlipemia   . Hyperlipidemia    Intolerant to statins  . Hypothyroidism (acquired)   . Increased risk of breast cancer 05/2014   IBIS=29.5%  . Increased risk of breast cancer 05/2014   IBIS=29.5%  . Migraine   . Migraines   . 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 Pancreatitis   . Sjogren's disease ([place])   . Spastic colon   . Thyroid cancer ([place]) 1990 and 1994   Total thyroidectomy with radioactive iodine.     Review of systems:      Physical Exam    Heart and lungs: Him without rub or gallop, lungs are bilaterally clear.    HEENT: Normocephalic atraumatic eyes are anicteric    Other:    Pertinant exam for procedure: Soft nontender nondistended bowel sounds positive normoactive.    Planned proceedures: Colonoscopy and indicated procedures. I have discussed the risks benefits and complications of procedures to include not limited to bleeding, infection, perforation and the risk of sedation and the patient wishes to proceed.    Lollie Sails, MD Gastroenterology 10/18/2018  8:59 AM

## 2018-10-19 LAB — SURGICAL PATHOLOGY

## 2018-10-22 ENCOUNTER — Encounter: Payer: Self-pay | Admitting: Gastroenterology

## 2018-10-29 NOTE — Anesthesia Postprocedure Evaluation (Signed)
Anesthesia Post Note  Patient: Susan [name]  Procedure(s) Performed: COLONOSCOPY WITH PROPOFOL (N/A )  Patient location during evaluation: PACU Anesthesia Type: General Level of consciousness: awake and alert Pain management: pain level controlled Vital Signs Assessment: post-procedure vital signs reviewed and stable Respiratory status: spontaneous breathing, nonlabored ventilation, respiratory function stable and patient connected to nasal cannula oxygen Cardiovascular status: blood pressure returned to baseline and stable Postop Assessment: no apparent nausea or vomiting Anesthetic complications: no     Last Vitals:  Vitals:   10/18/18 1010 10/18/18 1020  BP: 107/78 105/71  Pulse: 70 67  Resp: 15 14  Temp:    SpO2: 100% 100%    Last Pain:  Vitals:   10/19/18 0843  TempSrc:   PainSc: 0-No pain                 Molli Barrows

## 2018-11-01 DIAGNOSIS — Z79899 Other long term (current) drug therapy: Secondary | ICD-10-CM | POA: Diagnosis not present

## 2018-11-01 DIAGNOSIS — F410 Panic disorder [episodic paroxysmal anxiety] without agoraphobia: Secondary | ICD-10-CM | POA: Diagnosis not present

## 2019-02-07 ENCOUNTER — Other Ambulatory Visit: Payer: Self-pay

## 2019-02-07 ENCOUNTER — Ambulatory Visit (INDEPENDENT_AMBULATORY_CARE_PROVIDER_SITE_OTHER): Payer: Medicare Other | Admitting: Adult Health

## 2019-02-07 ENCOUNTER — Encounter: Payer: Self-pay | Admitting: Adult Health

## 2019-02-07 VITALS — BP 117/67 | HR 79 | Temp 97.1°F | Resp 16 | Ht 65.0 in | Wt 186.0 lb

## 2019-02-07 DIAGNOSIS — E039 Hypothyroidism, unspecified: Secondary | ICD-10-CM | POA: Diagnosis not present

## 2019-02-07 DIAGNOSIS — F419 Anxiety disorder, unspecified: Secondary | ICD-10-CM

## 2019-02-07 DIAGNOSIS — M797 Fibromyalgia: Secondary | ICD-10-CM

## 2019-02-07 DIAGNOSIS — F339 Major depressive disorder, recurrent, unspecified: Secondary | ICD-10-CM

## 2019-02-07 DIAGNOSIS — E785 Hyperlipidemia, unspecified: Secondary | ICD-10-CM

## 2019-02-07 DIAGNOSIS — K219 Gastro-esophageal reflux disease without esophagitis: Secondary | ICD-10-CM

## 2019-02-07 DIAGNOSIS — J4520 Mild intermittent asthma, uncomplicated: Secondary | ICD-10-CM

## 2019-02-07 DIAGNOSIS — R251 Tremor, unspecified: Secondary | ICD-10-CM | POA: Diagnosis not present

## 2019-02-07 DIAGNOSIS — E559 Vitamin D deficiency, unspecified: Secondary | ICD-10-CM

## 2019-02-07 DIAGNOSIS — M3500 Sicca syndrome, unspecified: Secondary | ICD-10-CM

## 2019-02-07 DIAGNOSIS — K900 Celiac disease: Secondary | ICD-10-CM

## 2019-02-07 MED ORDER — FENOFIBRATE 145 MG PO TABS: 145.0000 mg | ORAL_TABLET | Freq: Every day | ORAL | 1 refills | Status: DC

## 2019-02-07 MED ORDER — EZETIMIBE 10 MG PO TABS: 10.0000 mg | ORAL_TABLET | Freq: Every day | ORAL | 1 refills | Status: DC

## 2019-02-07 MED ORDER — LEVOTHYROXINE SODIUM 137 MCG PO CAPS: 137.0000 ug | ORAL_CAPSULE | Freq: Every day | ORAL | 1 refills | Status: DC

## 2019-02-07 MED ORDER — VITAMIN D (ERGOCALCIFEROL) 1.25 MG (50000 UNIT) PO CAPS: 50000.0000 [IU] | ORAL_CAPSULE | ORAL | 1 refills | Status: DC

## 2019-02-07 MED ORDER — GABAPENTIN 600 MG PO TABS: 300.0000 mg | ORAL_TABLET | Freq: Two times a day (BID) | ORAL | 1 refills | Status: DC

## 2019-02-07 NOTE — Progress Notes (Signed)
[facility] [place], [place] [address]  Internal MEDICINE  Office Visit Note  Patient Name: Susan [name]  [id number]  [id number]  Date of Service: 02/07/2019   Complaints/HPI Pt is here for establishment of PCP. Chief Complaint  Patient presents with  . New Patient (Initial Visit)  . Hyperlipidemia  . Sinusitis   HPI Pt is here to establish care with our practice.  She was seeing Dr. Ronnald Collum in [place]. She has a history remarkable for hypothyroid, hyperlipidemia, fibromyalgia, anxiety, depression, GERD, celiac disease, Sjogrens disease, vitamin D deficiency and asthma.Today she reports cough, PND. She reports she was walking her dog around her property when the symptoms started. Denies fever, sob or wheezing. She is currently using Singulair, albuterol, and taking allergy shots.       Current Medication: Outpatient Encounter Medications as of 02/07/2019  Medication Sig  . acetaminophen (TYLENOL) 500 MG tablet Take by mouth.  Marland Kitchen albuterol (PROVENTIL HFA;VENTOLIN HFA) 108 (90 Base) MCG/ACT inhaler Inhale 2 puffs into the lungs every 6 (six) hours as needed for wheezing or shortness of breath.  Marland Kitchen albuterol (PROVENTIL) (2.5 MG/3ML) 0.083% nebulizer solution Take 3 mLs (2.5 mg total) by nebulization every 6 (six) hours as needed for wheezing or shortness of breath.  . ALPRAZolam (XANAX) 0.5 MG tablet Take 0.5 mg by mouth 4 (four) times daily as needed. FOR ANXIETY  . amitriptyline (ELAVIL) 25 MG tablet Take 25 mg by mouth at bedtime.  . carisoprodol (SOMA) 350 MG tablet Take 350 mg by mouth at bedtime. Patient states that she takes 1/2 tablet nightly  . Dentifrices (BIOTENE DRY MOUTH DT) by Transmucosal route.  . DYMISTA 137-50 MCG/ACT SUSP Place 1 spray into both nostrils daily.  Marland Kitchen eletriptan (RELPAX) 40 MG tablet Take 40 mg by mouth as needed for migraine. may repeat in 2 hours if necessary   . EPINEPHRINE 0.3 mg/0.3 mL IJ SOAJ injection USE AS DIRECTED   . ezetimibe (ZETIA) 10 MG tablet Take 1 tablet (10 mg total) by mouth daily.  . famotidine (PEPCID) 10 MG tablet Take 20 mg by mouth.  . fenofibrate (TRICOR) 145 MG tablet Take 1 tablet (145 mg total) by mouth daily.  . Fluticasone Furoate (ARNUITY ELLIPTA) 100 MCG/ACT AEPB Inhale 2 puffs into the lungs every morning.  . gabapentin (NEURONTIN) 600 MG tablet Take 0.5 tablets (300 mg total) by mouth 2 (two) times daily.  . hydroxychloroquine (PLAQUENIL) 200 MG tablet Take by mouth 2 (two) times daily.  . hyoscyamine (NULEV) 0.125 MG TBDP Place 0.125 mg under the tongue every 4 (four) hours as needed for bladder spasms.   . Lactobacillus (DIGESTIVE HEALTH PROBIOTIC PO) Take 1 tablet by mouth daily.  Marland Kitchen MINIVELLE 0.1 MG/24HR patch Place 1 patch (0.1 mg total) onto the skin 2 (two) times a week.  . mometasone-formoterol (DULERA) 100-5 MCG/ACT AERO Inhale 2 puffs into the lungs 2 (two) times daily.  . montelukast (SINGULAIR) 10 MG tablet TAKE ONE TABLET BY MOUTH ONCE DAILY FOR ASTHMA  . pantoprazole (PROTONIX) 40 MG tablet Take 40 mg by mouth daily.  Vladimir Faster Glycol-Propyl Glycol 0.4-0.3 % SOLN Apply to eye.  . Polyethylene Glycol 3350 (PEG 3350) POWD AS DIRECTED FOR COLONIC PREP.  Marland Kitchen PRESCRIPTION MEDICATION 1 Syringe by Subdermal route once a week. Allergy shot  . sodium chloride (OCEAN) 0.65 % SOLN nasal spray Place 1 spray into both nostrils as needed for congestion.  . sodium chloride 0.9 % nebulizer solution Take 3 mLs  by nebulization 3 (three) times daily.  . SUMAtriptan-naproxen (TREXIMET) 85-500 MG per tablet Take 1 tablet by mouth daily as needed for migraine.  . Vaginal Lubricant (REPLENS) GEL Place vaginally.  . venlafaxine XR (EFFEXOR-XR) 150 MG 24 hr capsule Take 300 mg by mouth at bedtime.  . Vitamin D, Ergocalciferol, (DRISDOL) 1.25 MG (50000 UT) CAPS capsule Take 1 capsule (50,000 Units total) by mouth every 7 (seven) days. Monday  . vitamin E 1000 UNIT capsule Take by mouth.  .  [DISCONTINUED] ezetimibe (ZETIA) 10 MG tablet Take 10 mg by mouth daily.  . [DISCONTINUED] fenofibrate (TRICOR) 145 MG tablet   . [DISCONTINUED] gabapentin (NEURONTIN) 600 MG tablet Take by mouth.  . [DISCONTINUED] levothyroxine (SYNTHROID, LEVOTHROID) 112 MCG tablet Take 112 mcg by mouth every morning.   . [DISCONTINUED] LINZESS 290 MCG CAPS capsule   . [DISCONTINUED] SYNTHROID 137 MCG tablet   . [DISCONTINUED] Vitamin D, Ergocalciferol, (DRISDOL) 50000 units CAPS capsule Take 50,000 Units by mouth every 7 (seven) days. Monday  . Bisacodyl (DULCOLAX PO) Take by mouth.  . chlorpheniramine-HYDROcodone (TUSSIONEX PENNKINETIC ER) 10-8 MG/5ML SUER Take 5 mLs by mouth every 12 (twelve) hours as needed for cough. (Patient not taking: Reported on 02/07/2019)  . Levothyroxine Sodium 137 MCG CAPS Take 1 capsule (137 mcg total) by mouth daily before breakfast.  . mupirocin ointment (BACTROBAN) 2 %   . ondansetron (ZOFRAN ODT) 4 MG disintegrating tablet Take 1 tablet (4 mg total) by mouth every 8 (eight) hours as needed for nausea or vomiting. (Patient not taking: Reported on 02/07/2019)  . [DISCONTINUED] amoxicillin-clavulanate (AUGMENTIN) 875-125 MG tablet Take 1 tablet by mouth 2 (two) times daily. (Patient not taking: Reported on 10/18/2018)  . [DISCONTINUED] Calcium Carbonate Antacid 600 MG chewable tablet Chew by mouth.  . [DISCONTINUED] doxycycline (VIBRA-TABS) 100 MG tablet   . [DISCONTINUED] pilocarpine (SALAGEN) 5 MG tablet Take 5 mg by mouth 3 (three) times daily.  . [DISCONTINUED] Probiotic Product ([place]) Take by mouth.  . [DISCONTINUED] SIMETHICONE PO Take by mouth.   No facility-administered encounter medications on file as of 02/07/2019.     Surgical History: Past Surgical History:  Procedure Laterality Date  . ABDOMINAL HYSTERECTOMY  2006  . BLADDER REPAIR    . BREAST CYST ASPIRATION Left yrs ago  . CHOLECYSTECTOMY  1989  . COLONOSCOPY WITH  ESOPHAGOGASTRODUODENOSCOPY (EGD)    . COLONOSCOPY WITH PROPOFOL N/A 08/03/2017   Procedure: COLONOSCOPY WITH PROPOFOL;  Surgeon: Lollie Sails, MD;  Location: [facility] ENDOSCOPY;  Service: Endoscopy;  Laterality: N/A;  . COLONOSCOPY WITH PROPOFOL N/A 10/18/2018   Procedure: COLONOSCOPY WITH PROPOFOL;  Surgeon: Lollie Sails, MD;  Location: [facility] ENDOSCOPY;  Service: Endoscopy;  Laterality: N/A;  . ESOPHAGOGASTRODUODENOSCOPY N/A 10/07/2013   Procedure: ESOPHAGOGASTRODUODENOSCOPY (EGD);  Surgeon: Danie Binder, MD;  Location: [facility] ENDO SUITE;  Service: Endoscopy;  Laterality: N/A;  patient received heparin at 530am given phenergan 62m IV 30 minutes before  . EYE SURGERY     lacrimal gland   . FLEXIBLE BRONCHOSCOPY N/A 06/04/2016   Procedure: FLEXIBLE BRONCHOSCOPY;  Surgeon: SAllyne Gee MD;  Location: [facility] ORS;  Service: Pulmonary;  Laterality: N/A;  . INCONTINENCE SURGERY  2004  . NASAL SINUS SURGERY    . THYROID LOBECTOMY    . TOTAL THYROIDECTOMY      Medical History: Past Medical History:  Diagnosis Date  . Arthritis   . Asthma   . Asthmatic bronchitis   . BRCA negative 05/2014  MyRisk neg  . Celiac disease   . Celiac sprue   . Chronic anxiety   . Complication of anesthesia    vomiting  . COPD (chronic obstructive pulmonary disease) ([place])   . Depression   . Environmental allergies   . Family history of breast cancer 05/2014   MyRisk neg; IBIS=29.5%  . Fibromyalgia    neuropathy all over  . Gastritis 10/08/2013  . GERD (gastroesophageal reflux disease)   . Hyperlipemia   . Hyperlipidemia    Intolerant to statins  . Hypothyroidism (acquired)   . Increased risk of breast cancer 05/2014   IBIS=29.5%  . Increased risk of breast cancer 05/2014   IBIS=29.5%  . Migraine   . Migraines   . 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 Pancreatitis   . Sjogren's disease ([place])   . Spastic colon   . Thyroid cancer ([place]) 1990 and 1994   Total thyroidectomy with radioactive iodine.      Family History: Family History  Problem Relation Age of Onset  . Arthritis/Rheumatoid Mother        died in age 30s  . Breast cancer Mother 10  . Breast cancer Maternal Grandmother        40's  . Breast cancer Maternal Aunt        X 2. 50's  . Lung cancer Maternal Aunt   . Pancreatitis Neg Hx   . Colon cancer Neg Hx     Social History   Socioeconomic History  . Marital status: Married    Spouse name: Not on file  . Number of children: 0  . Years of education: Not on file  . Highest education level: Not on file  Occupational History  . Not on file  Social Needs  . Financial resource strain: Not on file  . Food insecurity:    Worry: Not on file    Inability: Not on file  . Transportation needs:    Medical: Not on file    Non-medical: Not on file  Tobacco Use  . Smoking status: Never Smoker  . Smokeless tobacco: Never Used  Substance and Sexual Activity  . Alcohol use: No  . Drug use: No  . Sexual activity: Yes    Birth control/protection: Surgical  Lifestyle  . Physical activity:    Days per week: Not on file    Minutes per session: Not on file  . Stress: Not on file  Relationships  . Social connections:    Talks on phone: Not on file    Gets together: Not on file    Attends religious service: Not on file    Active member of club or organization: Not on file    Attends meetings of clubs or organizations: Not on file    Relationship status: Not on file  . Intimate partner violence:    Fear of current or ex partner: Not on file    Emotionally abused: Not on file    Physically abused: Not on file    Forced sexual activity: Not on file  Other Topics Concern  . Not on file  Social History Narrative  . Not on file     Review of Systems  Constitutional: Negative for chills, fatigue and unexpected weight change.  HENT: Negative for congestion, rhinorrhea, sneezing and sore throat.   Eyes: Negative for photophobia, pain and redness.  Respiratory:  Negative for cough, chest tightness and shortness of breath.   Cardiovascular: Negative for chest pain and palpitations.  Gastrointestinal: Negative  for abdominal pain, constipation, diarrhea, nausea and vomiting.  Endocrine: Negative.   Genitourinary: Negative for dysuria and frequency.  Musculoskeletal: Negative for arthralgias, back pain, joint swelling and neck pain.  Skin: Negative for rash.  Allergic/Immunologic: Negative.   Neurological: Negative for tremors and numbness.  Hematological: Negative for adenopathy. Does not bruise/bleed easily.  Psychiatric/Behavioral: Negative for behavioral problems and sleep disturbance. The patient is not nervous/anxious.     Vital Signs: BP 117/67   Pulse 79   Temp (!) 97.1 F (36.2 C)   Resp 16   Ht 5' 5"  (1.651 m)   Wt 186 lb (84.4 kg)   SpO2 97%   BMI 30.95 kg/m    Physical Exam Vitals signs and nursing note reviewed.  Constitutional:      General: She is not in acute distress.    Appearance: She is well-developed. She is not diaphoretic.  HENT:     Head: Normocephalic and atraumatic.     Mouth/Throat:     Pharynx: No oropharyngeal exudate.  Eyes:     Pupils: Pupils are equal, round, and reactive to light.  Neck:     Musculoskeletal: Normal range of motion and neck supple.     Thyroid: No thyromegaly.     Vascular: No JVD.     Trachea: No tracheal deviation.  Cardiovascular:     Rate and Rhythm: Normal rate and regular rhythm.     Heart sounds: Normal heart sounds. No murmur. No friction rub. No gallop.   Pulmonary:     Effort: Pulmonary effort is normal. No respiratory distress.     Breath sounds: Normal breath sounds. No wheezing or rales.  Chest:     Chest wall: No tenderness.  Abdominal:     Palpations: Abdomen is soft.     Tenderness: There is no abdominal tenderness. There is no guarding.  Musculoskeletal: Normal range of motion.  Lymphadenopathy:     Cervical: No cervical adenopathy.  Skin:    General: Skin  is warm and dry.  Neurological:     Mental Status: She is alert and oriented to person, place, and time.     Cranial Nerves: No cranial nerve deficit.  Psychiatric:        Behavior: Behavior normal.        Thought Content: Thought content normal.        Judgment: Judgment normal.    Assessment/Plan: 1. Hypothyroidism, unspecified type Refilled patients Levothyroxine. Continue to monitor TSH and FT4 levels. - Levothyroxine Sodium 137 MCG CAPS; Take 1 capsule (137 mcg total) by mouth daily before breakfast.  Dispense: 90 capsule; Refill: 1  2. Hyperlipidemia, unspecified hyperlipidemia type Refilled patient zetia and tricor at this time. Weill check lipid panel at next physical lab draw. - ezetimibe (ZETIA) 10 MG tablet; Take 1 tablet (10 mg total) by mouth daily.  Dispense: 90 tablet; Refill: 1 - fenofibrate (TRICOR) 145 MG tablet; Take 1 tablet (145 mg total) by mouth daily.  Dispense: 90 tablet; Refill: 1  3. Tremors of nervous system Evaluated by Neuro, per patient.  Continue to monitor.  4. Fibromyalgia Refilled Neurontin. - gabapentin (NEURONTIN) 600 MG tablet; Take 0.5 tablets (300 mg total) by mouth 2 (two) times daily.  Dispense: 45 tablet; Refill: 1  5. Anxiety Stable, continue present management.  6. Depression, recurrent ([place]) Stable, continue present management.  7. Gastroesophageal reflux disease without esophagitis Continue to use pepcid as discussed for symptoms.   8. CD (celiac disease) Stable, continue present management.  9. Hx of Sjogren's disease ([place]) Stable, continue to follow up with specialist.   10. Mild intermittent chronic asthma without complication Stable, continue to follow  11. Vitamin D deficiency Refilled drisdol, will recheck level at next blood draw. - Vitamin D, Ergocalciferol, (DRISDOL) 1.25 MG (50000 UT) CAPS capsule; Take 1 capsule (50,000 Units total) by mouth every 7 (seven) days. Monday  Dispense: 12 capsule; Refill:  1  General Counseling: anaka beazer understanding of the findings of todays visit and agrees with plan of treatment. I have discussed any further diagnostic evaluation that may be needed or ordered today. We also reviewed her medications today. she has been encouraged to call the office with any questions or concerns that should arise related to todays visit.  No orders of the defined types were placed in this encounter.   Meds ordered this encounter  Medications  . gabapentin (NEURONTIN) 600 MG tablet    Sig: Take 0.5 tablets (300 mg total) by mouth 2 (two) times daily.    Dispense:  45 tablet    Refill:  1  . Levothyroxine Sodium 137 MCG CAPS    Sig: Take 1 capsule (137 mcg total) by mouth daily before breakfast.    Dispense:  90 capsule    Refill:  1  . ezetimibe (ZETIA) 10 MG tablet    Sig: Take 1 tablet (10 mg total) by mouth daily.    Dispense:  90 tablet    Refill:  1  . fenofibrate (TRICOR) 145 MG tablet    Sig: Take 1 tablet (145 mg total) by mouth daily.    Dispense:  90 tablet    Refill:  1  . Vitamin D, Ergocalciferol, (DRISDOL) 1.25 MG (50000 UT) CAPS capsule    Sig: Take 1 capsule (50,000 Units total) by mouth every 7 (seven) days. Monday    Dispense:  12 capsule    Refill:  1    Time spent: 35 Minutes   This patient was seen by Orson Gear AGNP-C in Collaboration with Dr Lavera Guise as a part of collaborative care agreement  Kendell Bane AGNP-C Internal Medicine

## 2019-02-17 ENCOUNTER — Ambulatory Visit: Payer: Self-pay | Admitting: Internal Medicine

## 2019-02-28 ENCOUNTER — Ambulatory Visit (INDEPENDENT_AMBULATORY_CARE_PROVIDER_SITE_OTHER): Payer: Medicare Other | Admitting: Nurse Practitioner

## 2019-02-28 ENCOUNTER — Encounter: Payer: Self-pay | Admitting: Nurse Practitioner

## 2019-02-28 ENCOUNTER — Other Ambulatory Visit: Payer: Self-pay

## 2019-02-28 VITALS — Ht 64.0 in | Wt 184.0 lb

## 2019-02-28 DIAGNOSIS — J4520 Mild intermittent asthma, uncomplicated: Secondary | ICD-10-CM | POA: Diagnosis not present

## 2019-02-28 DIAGNOSIS — F411 Generalized anxiety disorder: Secondary | ICD-10-CM

## 2019-02-28 DIAGNOSIS — J309 Allergic rhinitis, unspecified: Secondary | ICD-10-CM

## 2019-02-28 MED ORDER — ALPRAZOLAM 0.5 MG PO TABS: 0.5000 mg | ORAL_TABLET | Freq: Two times a day (BID) | ORAL | 0 refills | Status: DC | PRN

## 2019-02-28 NOTE — Progress Notes (Signed)
[facility] [place], [place] [address]  Internal MEDICINE  Telephone Visit  Patient Name: Susan [name]  [id number]  [id number]  Date of Service: 02/28/2019  I connected with the patient at 11:16am by webcaam and verified the patients identity using two identifiers.   I discussed the limitations, risks, security and privacy concerns of performing an evaluation and management service by webcam and the availability of in person appointments. I also discussed with the patient that there may be a patient responsible charge related to the service.  The patient expressed understanding and agrees to proceed.    Chief Complaint  Patient presents with  . Telephone Screen    VIDEO VISIT   . Telephone Assessment  . Medical Management of Chronic Issues    Follow up allergy, need new vial    The patient has been contacted via webcam for follow up visit due to concerns for spread of novel coronavirus.  She continues to have SOB with exertion. Thi is not unusual. Uses dulera twice daily. States that she uses rescue inhaler once per day, sometimes less. She takes allergy injections. She is due to have a new vial ordered.  She states she has been experiencing some tingling sensations in her legs and feet and burning pain under her arm. She feels like this may be due to stopping her xanax abruptly. She states that her psychiatrist recently lost his license to prescribe controlled medications. She is scheduled to see a new psychiatrist on 03/30/2019.        Current Medication: Outpatient Encounter Medications as of 02/28/2019  Medication Sig  . acetaminophen (TYLENOL) 500 MG tablet Take by mouth.  Marland Kitchen albuterol (PROVENTIL HFA;VENTOLIN HFA) 108 (90 Base) MCG/ACT inhaler Inhale 2 puffs into the lungs every 6 (six) hours as needed for wheezing or shortness of breath.  Marland Kitchen albuterol (PROVENTIL) (2.5 MG/3ML) 0.083% nebulizer solution Take 3 mLs (2.5 mg total) by nebulization every 6 (six)  hours as needed for wheezing or shortness of breath.  . ALPRAZolam (XANAX) 0.5 MG tablet Take 1 tablet (0.5 mg total) by mouth 2 (two) times daily as needed. FOR ANXIETY  . amitriptyline (ELAVIL) 25 MG tablet Take 25 mg by mouth at bedtime.  . Bisacodyl (DULCOLAX PO) Take by mouth.  . carisoprodol (SOMA) 350 MG tablet Take 350 mg by mouth at bedtime. Patient states that she takes 1/2 tablet nightly  . Dentifrices (BIOTENE DRY MOUTH DT) by Transmucosal route.  . DYMISTA 137-50 MCG/ACT SUSP Place 1 spray into both nostrils daily.  Marland Kitchen eletriptan (RELPAX) 40 MG tablet Take 40 mg by mouth as needed for migraine. may repeat in 2 hours if necessary   . EPINEPHRINE 0.3 mg/0.3 mL IJ SOAJ injection USE AS DIRECTED  . ezetimibe (ZETIA) 10 MG tablet Take 1 tablet (10 mg total) by mouth daily.  . famotidine (PEPCID) 10 MG tablet Take 20 mg by mouth.  . fenofibrate (TRICOR) 145 MG tablet Take 1 tablet (145 mg total) by mouth daily.  . Fluticasone Furoate (ARNUITY ELLIPTA) 100 MCG/ACT AEPB Inhale 2 puffs into the lungs every morning.  . gabapentin (NEURONTIN) 600 MG tablet Take 0.5 tablets (300 mg total) by mouth 2 (two) times daily.  . hydroxychloroquine (PLAQUENIL) 200 MG tablet Take by mouth 2 (two) times daily.  . hyoscyamine (NULEV) 0.125 MG TBDP Place 0.125 mg under the tongue every 4 (four) hours as needed for bladder spasms.   . Lactobacillus (DIGESTIVE HEALTH PROBIOTIC PO) Take 1 tablet  by mouth daily.  . Levothyroxine Sodium 137 MCG CAPS Take 1 capsule (137 mcg total) by mouth daily before breakfast.  . MINIVELLE 0.1 MG/24HR patch Place 1 patch (0.1 mg total) onto the skin 2 (two) times a week.  . mometasone-formoterol (DULERA) 100-5 MCG/ACT AERO Inhale 2 puffs into the lungs 2 (two) times daily.  . montelukast (SINGULAIR) 10 MG tablet TAKE ONE TABLET BY MOUTH ONCE DAILY FOR ASTHMA  . mupirocin ointment (BACTROBAN) 2 %   . pantoprazole (PROTONIX) 40 MG tablet Take 40 mg by mouth daily.  Vladimir Faster  Glycol-Propyl Glycol 0.4-0.3 % SOLN Apply to eye.  . Polyethylene Glycol 3350 (PEG 3350) POWD AS DIRECTED FOR COLONIC PREP.  Marland Kitchen PRESCRIPTION MEDICATION 1 Syringe by Subdermal route once a week. Allergy shot  . sodium chloride (OCEAN) 0.65 % SOLN nasal spray Place 1 spray into both nostrils as needed for congestion.  . sodium chloride 0.9 % nebulizer solution Take 3 mLs by nebulization 3 (three) times daily.  . SUMAtriptan-naproxen (TREXIMET) 85-500 MG per tablet Take 1 tablet by mouth daily as needed for migraine.  . Vaginal Lubricant (REPLENS) GEL Place vaginally.  . venlafaxine XR (EFFEXOR-XR) 150 MG 24 hr capsule Take 300 mg by mouth at bedtime.  . Vitamin D, Ergocalciferol, (DRISDOL) 1.25 MG (50000 UT) CAPS capsule Take 1 capsule (50,000 Units total) by mouth every 7 (seven) days. Monday  . vitamin E 1000 UNIT capsule Take by mouth.  . [DISCONTINUED] ALPRAZolam (XANAX) 0.5 MG tablet Take 0.5 mg by mouth 4 (four) times daily as needed. FOR ANXIETY  . [DISCONTINUED] chlorpheniramine-HYDROcodone (TUSSIONEX PENNKINETIC ER) 10-8 MG/5ML SUER Take 5 mLs by mouth every 12 (twelve) hours as needed for cough. (Patient not taking: Reported on 02/07/2019)  . [DISCONTINUED] ondansetron (ZOFRAN ODT) 4 MG disintegrating tablet Take 1 tablet (4 mg total) by mouth every 8 (eight) hours as needed for nausea or vomiting. (Patient not taking: Reported on 02/07/2019)   No facility-administered encounter medications on file as of 02/28/2019.     Surgical History: Past Surgical History:  Procedure Laterality Date  . ABDOMINAL HYSTERECTOMY  2006  . BLADDER REPAIR    . BREAST CYST ASPIRATION Left yrs ago  . CHOLECYSTECTOMY  1989  . COLONOSCOPY WITH ESOPHAGOGASTRODUODENOSCOPY (EGD)    . COLONOSCOPY WITH PROPOFOL N/A 08/03/2017   Procedure: COLONOSCOPY WITH PROPOFOL;  Surgeon: Lollie Sails, MD;  Location: [facility] ENDOSCOPY;  Service: Endoscopy;  Laterality: N/A;  . COLONOSCOPY WITH PROPOFOL N/A 10/18/2018    Procedure: COLONOSCOPY WITH PROPOFOL;  Surgeon: Lollie Sails, MD;  Location: [facility] ENDOSCOPY;  Service: Endoscopy;  Laterality: N/A;  . ESOPHAGOGASTRODUODENOSCOPY N/A 10/07/2013   Procedure: ESOPHAGOGASTRODUODENOSCOPY (EGD);  Surgeon: Danie Binder, MD;  Location: [facility] ENDO SUITE;  Service: Endoscopy;  Laterality: N/A;  patient received heparin at 530am given phenergan 1m IV 30 minutes before  . EYE SURGERY     lacrimal gland   . FLEXIBLE BRONCHOSCOPY N/A 06/04/2016   Procedure: FLEXIBLE BRONCHOSCOPY;  Surgeon: SAllyne Gee MD;  Location: [facility] ORS;  Service: Pulmonary;  Laterality: N/A;  . INCONTINENCE SURGERY  2004  . NASAL SINUS SURGERY    . THYROID LOBECTOMY    . TOTAL THYROIDECTOMY      Medical History: Past Medical History:  Diagnosis Date  . Arthritis   . Asthma   . Asthmatic bronchitis   . BRCA negative 05/2014   MyRisk neg  . Celiac disease   . Celiac sprue   . Chronic anxiety   .  Complication of anesthesia    vomiting  . COPD (chronic obstructive pulmonary disease) ([place])   . Depression   . Environmental allergies   . Family history of breast cancer 05/2014   MyRisk neg; IBIS=29.5%  . Fibromyalgia    neuropathy all over  . Gastritis 10/08/2013  . GERD (gastroesophageal reflux disease)   . Hyperlipemia   . Hyperlipidemia    Intolerant to statins  . Hypothyroidism (acquired)   . Increased risk of breast cancer 05/2014   IBIS=29.5%  . Increased risk of breast cancer 05/2014   IBIS=29.5%  . Migraine   . Migraines   . 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 Pancreatitis   . Sjogren's disease ([place])   . Spastic colon   . Thyroid cancer ([place]) 1990 and 1994   Total thyroidectomy with radioactive iodine.     Family History: Family History  Problem Relation Age of Onset  . Arthritis/Rheumatoid Mother        died in age 13s  . Breast cancer Mother 23  . Breast cancer Maternal Grandmother        40's  . Breast cancer Maternal Aunt        X 2. 50's  . Lung  cancer Maternal Aunt   . Pancreatitis Neg Hx   . Colon cancer Neg Hx     Social History   Socioeconomic History  . Marital status: Married    Spouse name: Not on file  . Number of children: 0  . Years of education: Not on file  . Highest education level: Not on file  Occupational History  . Not on file  Social Needs  . Financial resource strain: Not on file  . Food insecurity:    Worry: Not on file    Inability: Not on file  . Transportation needs:    Medical: Not on file    Non-medical: Not on file  Tobacco Use  . Smoking status: Never Smoker  . Smokeless tobacco: Never Used  Substance and Sexual Activity  . Alcohol use: No  . Drug use: No  . Sexual activity: Yes    Birth control/protection: Surgical  Lifestyle  . Physical activity:    Days per week: Not on file    Minutes per session: Not on file  . Stress: Not on file  Relationships  . Social connections:    Talks on phone: Not on file    Gets together: Not on file    Attends religious service: Not on file    Active member of club or organization: Not on file    Attends meetings of clubs or organizations: Not on file    Relationship status: Not on file  . Intimate partner violence:    Fear of current or ex partner: Not on file    Emotionally abused: Not on file    Physically abused: Not on file    Forced sexual activity: Not on file  Other Topics Concern  . Not on file  Social History Narrative  . Not on file      Review of Systems  Constitutional: Negative for activity change, chills, fatigue and unexpected weight change.  HENT: Negative for congestion, rhinorrhea, sneezing and sore throat.   Respiratory: Positive for shortness of breath and wheezing. Negative for cough and chest tightness.        Well managed with current inhalers .  Cardiovascular: Negative for chest pain and palpitations.  Gastrointestinal: Negative for abdominal pain, constipation, diarrhea, nausea and vomiting.  Musculoskeletal: Negative for arthralgias, back pain, joint swelling and neck pain.  Skin: Negative for rash.  Allergic/Immunologic: Positive for environmental allergies.  Neurological: Negative for tremors and numbness.  Hematological: Negative for adenopathy. Does not bruise/bleed easily.  Psychiatric/Behavioral: Negative for behavioral problems and sleep disturbance. The patient is nervous/anxious.     Today's Vitals   02/28/19 1103  Weight: 184 lb (83.5 kg)  Height: 5' 4"  (1.626 m)   Body mass index is 31.58 kg/m.  Observation/Objective:  The patient is alert and oriented. She is pleasant and appears healthy. Mild shortness of breath is appreciated without cough. She is in no acute distress at this time.     Assessment/Plan:  1. Mild intermittent chronic asthma without complication Well managed. Continue dulera twice daily. Use rescue inhaler as needed and as prescribed.   2. Allergic rhinitis, unspecified seasonality, unspecified trigger Continue allergy injections as prescribed. Order given for new vial to be available for pick up at her next in-office visit.   3. Generalized anxiety disorder Filled alprazolam 0.11m twice daily as needed for acute anxiety. Single 30 day prescription sent to her pharmacy without refills. She will see new psychiatrist 03/30/2019.  - ALPRAZolam (XANAX) 0.5 MG tablet; Take 1 tablet (0.5 mg total) by mouth 2 (two) times daily as needed. FOR ANXIETY  Dispense: 60 tablet; Refill: 0   General Counseling: Kberkeley veldmanunderstanding of the findings of today's phone visit and agrees with plan of treatment. I have discussed any further diagnostic evaluation that may be needed or ordered today. We also reviewed her medications today. she has been encouraged to call the office with any questions or concerns that should arise related to todays visit.  This patient was seen by HCelestewith Dr FLavera Guiseas a part of  collaborative care agreement  Meds ordered this encounter  Medications  . ALPRAZolam (XANAX) 0.5 MG tablet    Sig: Take 1 tablet (0.5 mg total) by mouth 2 (two) times daily as needed. FOR ANXIETY    Dispense:  60 tablet    Refill:  0    Order Specific Question:   Supervising Provider    Answer:   KLavera Guise[[[id number]]   Time spent: 283Minutes    Dr FLavera GuiseInternal medicine

## 2019-03-01 ENCOUNTER — Other Ambulatory Visit: Payer: Self-pay | Admitting: Internal Medicine

## 2019-03-01 DIAGNOSIS — J3089 Other allergic rhinitis: Secondary | ICD-10-CM

## 2019-03-02 DIAGNOSIS — G250 Essential tremor: Secondary | ICD-10-CM | POA: Diagnosis not present

## 2019-03-02 DIAGNOSIS — G43719 Chronic migraine without aura, intractable, without status migrainosus: Secondary | ICD-10-CM | POA: Diagnosis not present

## 2019-03-03 ENCOUNTER — Other Ambulatory Visit: Payer: Self-pay | Admitting: Adult Health

## 2019-03-03 DIAGNOSIS — E538 Deficiency of other specified B group vitamins: Secondary | ICD-10-CM | POA: Diagnosis not present

## 2019-03-03 DIAGNOSIS — K219 Gastro-esophageal reflux disease without esophagitis: Secondary | ICD-10-CM | POA: Diagnosis not present

## 2019-03-03 DIAGNOSIS — E039 Hypothyroidism, unspecified: Secondary | ICD-10-CM | POA: Diagnosis not present

## 2019-03-03 DIAGNOSIS — E785 Hyperlipidemia, unspecified: Secondary | ICD-10-CM | POA: Diagnosis not present

## 2019-03-03 DIAGNOSIS — R790 Abnormal level of blood mineral: Secondary | ICD-10-CM | POA: Diagnosis not present

## 2019-03-04 LAB — LIPID PANEL WITH LDL/HDL RATIO
Cholesterol, Total: 189 mg/dL (ref 100–199)
HDL: 53 mg/dL (ref >39)
LDL Calculated: 112 mg/dL — ABNORMAL HIGH (ref 0–99)
LDl/HDL Ratio: 2.1 ratio (ref 0.0–3.2)
Triglycerides: 121 mg/dL (ref 0–149)
VLDL Cholesterol Cal: 24 mg/dL (ref 5–40)

## 2019-03-04 LAB — COMPREHENSIVE METABOLIC PANEL
ALT: 21 IU/L (ref 0–32)
AST: 18 IU/L (ref 0–40)
Albumin/Globulin Ratio: 1.7 (ref 1.2–2.2)
Albumin: 4.7 g/dL (ref 3.8–4.8)
Alkaline Phosphatase: 78 IU/L (ref 39–117)
BUN/Creatinine Ratio: 17 (ref 12–28)
BUN: 17 mg/dL (ref 8–27)
Bilirubin Total: 0.3 mg/dL (ref 0.0–1.2)
CO2: 22 mmol/L (ref 20–29)
Calcium: 9.6 mg/dL (ref 8.7–10.3)
Chloride: 105 mmol/L (ref 96–106)
Creatinine, Ser: 1.01 mg/dL — ABNORMAL HIGH (ref 0.57–1.00)
GFR calc Af Amer: 68 mL/min/{1.73_m2} (ref >59)
GFR calc non Af Amer: 59 mL/min/{1.73_m2} — ABNORMAL LOW (ref >59)
Globulin, Total: 2.8 g/dL (ref 1.5–4.5)
Glucose: 92 mg/dL (ref 65–99)
Potassium: 4.6 mmol/L (ref 3.5–5.2)
Sodium: 142 mmol/L (ref 134–144)
Total Protein: 7.5 g/dL (ref 6.0–8.5)

## 2019-03-04 LAB — TSH: TSH: 0.621 u[IU]/mL (ref 0.450–4.500)

## 2019-03-04 LAB — IRON AND TIBC
Iron Saturation: 23 % (ref 15–55)
Iron: 80 ug/dL (ref 27–139)
Total Iron Binding Capacity: 352 ug/dL (ref 250–450)
UIBC: 272 ug/dL (ref 118–369)

## 2019-03-04 LAB — CBC WITH DIFFERENTIAL/PLATELET
Basophils Absolute: 0.1 10*3/uL (ref 0.0–0.2)
Basos: 1 %
EOS (ABSOLUTE): 0.2 10*3/uL (ref 0.0–0.4)
Eos: 5 %
Hematocrit: 41.2 % (ref 34.0–46.6)
Hemoglobin: 13.6 g/dL (ref 11.1–15.9)
Immature Grans (Abs): 0 10*3/uL (ref 0.0–0.1)
Immature Granulocytes: 0 %
Lymphocytes Absolute: 2 10*3/uL (ref 0.7–3.1)
Lymphs: 37 %
MCH: 28.9 pg (ref 26.6–33.0)
MCHC: 33 g/dL (ref 31.5–35.7)
MCV: 88 fL (ref 79–97)
Monocytes Absolute: 0.5 10*3/uL (ref 0.1–0.9)
Monocytes: 9 %
Neutrophils Absolute: 2.6 10*3/uL (ref 1.4–7.0)
Neutrophils: 48 %
Platelets: 366 10*3/uL (ref 150–450)
RBC: 4.71 x10E6/uL (ref 3.77–5.28)
RDW: 12.6 % (ref 11.7–15.4)
WBC: 5.4 10*3/uL (ref 3.4–10.8)

## 2019-03-04 LAB — B12 AND FOLATE PANEL
Folate: 7.3 ng/mL (ref >3.0)
Vitamin B-12: 282 pg/mL (ref 232–1245)

## 2019-03-04 LAB — T4, FREE: Free T4: 1.07 ng/dL (ref 0.82–1.77)

## 2019-03-04 LAB — FERRITIN: Ferritin: 78 ng/mL (ref 15–150)

## 2019-03-04 LAB — VITAMIN D 25 HYDROXY (VIT D DEFICIENCY, FRACTURES): Vit D, 25-Hydroxy: 39.2 ng/mL (ref 30.0–100.0)

## 2019-03-09 DIAGNOSIS — J301 Allergic rhinitis due to pollen: Secondary | ICD-10-CM | POA: Diagnosis not present

## 2019-03-22 ENCOUNTER — Encounter: Payer: Self-pay | Admitting: Nurse Practitioner

## 2019-03-22 ENCOUNTER — Ambulatory Visit (INDEPENDENT_AMBULATORY_CARE_PROVIDER_SITE_OTHER): Payer: Medicare Other

## 2019-03-22 ENCOUNTER — Ambulatory Visit (INDEPENDENT_AMBULATORY_CARE_PROVIDER_SITE_OTHER): Payer: Medicare Other | Admitting: Nurse Practitioner

## 2019-03-22 ENCOUNTER — Other Ambulatory Visit: Payer: Self-pay

## 2019-03-22 VITALS — BP 114/77 | HR 79 | Resp 16 | Ht 65.0 in | Wt 186.2 lb

## 2019-03-22 DIAGNOSIS — Z0001 Encounter for general adult medical examination with abnormal findings: Secondary | ICD-10-CM | POA: Diagnosis not present

## 2019-03-22 DIAGNOSIS — J4520 Mild intermittent asthma, uncomplicated: Secondary | ICD-10-CM

## 2019-03-22 DIAGNOSIS — J301 Allergic rhinitis due to pollen: Secondary | ICD-10-CM | POA: Diagnosis not present

## 2019-03-22 DIAGNOSIS — R300 Dysuria: Secondary | ICD-10-CM

## 2019-03-22 DIAGNOSIS — F411 Generalized anxiety disorder: Secondary | ICD-10-CM

## 2019-03-22 DIAGNOSIS — J309 Allergic rhinitis, unspecified: Secondary | ICD-10-CM

## 2019-03-22 MED ORDER — ALPRAZOLAM 0.5 MG PO TABS: 0.5000 mg | ORAL_TABLET | Freq: Two times a day (BID) | ORAL | 0 refills | Status: DC | PRN

## 2019-03-22 MED ORDER — "TUBERCULIN-ALLERGY SYRINGES 28G X 1/2"" 1 ML MISC": 1.0000 | 3 refills | Status: DC

## 2019-03-22 NOTE — Progress Notes (Signed)
[facility] [place], [place] [address]  Internal MEDICINE  Office Visit Note  Patient Name: FLEURETTE WOOLBRIGHT  [id number]  [id number]  Date of Service: 04/03/2019   Pt is here for routine health maintenance examination  Chief Complaint  Patient presents with  . Medicare Wellness  . Hypothyroidism  . Hyperlipidemia  . Labs Only    review labs     The patient is here for routine health maintenance exam. She continues to have SOB with exertion. Thi is not unusual. Uses dulera twice daily. States that she uses rescue inhaler once per day, sometimes less. She takes allergy injections. She is due to have a new vial ordered.  She states she has been experiencing some tingling sensations in her legs and feet and burning pain under her arm. She feels like this may be due to stopping her xanax abruptly. I did do short-term prescription for her alprazolam to take twice daily if needed, as her psychiatrist has lost his ability to prescribe scheduled medications. She is scheduled to see new psychiatrist in early June, 2020.      Current Medication: Outpatient Encounter Medications as of 03/22/2019  Medication Sig  . acetaminophen (TYLENOL) 500 MG tablet Take by mouth.  Marland Kitchen albuterol (PROVENTIL HFA;VENTOLIN HFA) 108 (90 Base) MCG/ACT inhaler Inhale 2 puffs into the lungs every 6 (six) hours as needed for wheezing or shortness of breath.  Marland Kitchen albuterol (PROVENTIL) (2.5 MG/3ML) 0.083% nebulizer solution Take 3 mLs (2.5 mg total) by nebulization every 6 (six) hours as needed for wheezing or shortness of breath.  . ALPRAZolam (XANAX) 0.5 MG tablet Take 1 tablet (0.5 mg total) by mouth 2 (two) times daily as needed. FOR ANXIETY  . amitriptyline (ELAVIL) 25 MG tablet Take 25 mg by mouth at bedtime.  . Bisacodyl (DULCOLAX PO) Take by mouth.  . carisoprodol (SOMA) 350 MG tablet Take 350 mg by mouth at bedtime. Patient states that she takes 1/2 tablet nightly  . Dentifrices (BIOTENE  DRY MOUTH DT) by Transmucosal route.  . DYMISTA 137-50 MCG/ACT SUSP Use 1 spray(s) in each nostril once daily  . eletriptan (RELPAX) 40 MG tablet Take 40 mg by mouth as needed for migraine. may repeat in 2 hours if necessary   . EPINEPHRINE 0.3 mg/0.3 mL IJ SOAJ injection USE AS DIRECTED  . ezetimibe (ZETIA) 10 MG tablet Take 1 tablet (10 mg total) by mouth daily.  . famotidine (PEPCID) 10 MG tablet Take 20 mg by mouth.  . fenofibrate (TRICOR) 145 MG tablet Take 1 tablet (145 mg total) by mouth daily.  . Fluticasone Furoate (ARNUITY ELLIPTA) 100 MCG/ACT AEPB Inhale 2 puffs into the lungs every morning.  . gabapentin (NEURONTIN) 600 MG tablet Take 0.5 tablets (300 mg total) by mouth 2 (two) times daily.  . hydroxychloroquine (PLAQUENIL) 200 MG tablet Take by mouth 2 (two) times daily.  . hyoscyamine (NULEV) 0.125 MG TBDP Place 0.125 mg under the tongue every 4 (four) hours as needed for bladder spasms.   . Lactobacillus (DIGESTIVE HEALTH PROBIOTIC PO) Take 1 tablet by mouth daily.  . Levothyroxine Sodium 137 MCG CAPS Take 1 capsule (137 mcg total) by mouth daily before breakfast.  . MINIVELLE 0.1 MG/24HR patch Place 1 patch (0.1 mg total) onto the skin 2 (two) times a week.  . mometasone-formoterol (DULERA) 100-5 MCG/ACT AERO Inhale 2 puffs into the lungs 2 (two) times daily.  . montelukast (SINGULAIR) 10 MG tablet TAKE ONE TABLET BY MOUTH ONCE DAILY  FOR ASTHMA  . mupirocin ointment (BACTROBAN) 2 %   . pantoprazole (PROTONIX) 40 MG tablet Take 40 mg by mouth daily.  Vladimir Faster Glycol-Propyl Glycol 0.4-0.3 % SOLN Apply to eye.  . Polyethylene Glycol 3350 (PEG 3350) POWD AS DIRECTED FOR COLONIC PREP.  Marland Kitchen PRESCRIPTION MEDICATION 1 Syringe by Subdermal route once a week. Allergy shot  . sodium chloride (OCEAN) 0.65 % SOLN nasal spray Place 1 spray into both nostrils as needed for congestion.  . sodium chloride 0.9 % nebulizer solution Take 3 mLs by nebulization 3 (three) times daily.  . Vaginal  Lubricant (REPLENS) GEL Place vaginally.  . venlafaxine XR (EFFEXOR-XR) 150 MG 24 hr capsule Take 300 mg by mouth at bedtime.  . Vitamin D, Ergocalciferol, (DRISDOL) 1.25 MG (50000 UT) CAPS capsule Take 1 capsule (50,000 Units total) by mouth every 7 (seven) days. Monday  . vitamin E 1000 UNIT capsule Take by mouth.  . [DISCONTINUED] ALPRAZolam (XANAX) 0.5 MG tablet Take 1 tablet (0.5 mg total) by mouth 2 (two) times daily as needed. FOR ANXIETY  . [DISCONTINUED] SUMAtriptan-naproxen (TREXIMET) 85-500 MG per tablet Take 1 tablet by mouth daily as needed for migraine.  . Tuberculin-Allergy Syringes 28G X 1/2" 1 ML MISC 1 each by Does not apply route every 14 (fourteen) days. For allergy injections   No facility-administered encounter medications on file as of 03/22/2019.     Surgical History: Past Surgical History:  Procedure Laterality Date  . ABDOMINAL HYSTERECTOMY  2006  . BLADDER REPAIR    . BREAST CYST ASPIRATION Left yrs ago  . CHOLECYSTECTOMY  1989  . COLONOSCOPY WITH ESOPHAGOGASTRODUODENOSCOPY (EGD)    . COLONOSCOPY WITH PROPOFOL N/A 08/03/2017   Procedure: COLONOSCOPY WITH PROPOFOL;  Surgeon: Lollie Sails, MD;  Location: [facility] ENDOSCOPY;  Service: Endoscopy;  Laterality: N/A;  . COLONOSCOPY WITH PROPOFOL N/A 10/18/2018   Procedure: COLONOSCOPY WITH PROPOFOL;  Surgeon: Lollie Sails, MD;  Location: [facility] ENDOSCOPY;  Service: Endoscopy;  Laterality: N/A;  . ESOPHAGOGASTRODUODENOSCOPY N/A 10/07/2013   Procedure: ESOPHAGOGASTRODUODENOSCOPY (EGD);  Surgeon: Danie Binder, MD;  Location: [facility] ENDO SUITE;  Service: Endoscopy;  Laterality: N/A;  patient received heparin at 530am given phenergan 73m IV 30 minutes before  . EYE SURGERY     lacrimal gland   . FLEXIBLE BRONCHOSCOPY N/A 06/04/2016   Procedure: FLEXIBLE BRONCHOSCOPY;  Surgeon: SAllyne Gee MD;  Location: [facility] ORS;  Service: Pulmonary;  Laterality: N/A;  . INCONTINENCE SURGERY  2004  . NASAL SINUS SURGERY    . THYROID  LOBECTOMY    . TOTAL THYROIDECTOMY      Medical History: Past Medical History:  Diagnosis Date  . Arthritis   . Asthma   . Asthmatic bronchitis   . BRCA negative 05/2014   MyRisk neg  . Celiac disease   . Celiac sprue   . Chronic anxiety   . Complication of anesthesia    vomiting  . [place]   . Depression   . Environmental allergies   . Family history of breast cancer 05/2014   MyRisk neg; IBIS=29.5%  . Fibromyalgia    neuropathy all over  . Gastritis 10/08/2013  . GERD (gastroesophageal reflux disease)   . Hyperlipemia   . Hyperlipidemia    Intolerant to statins  . Hypothyroidism (acquired)   . Increased risk of breast cancer 05/2014   IBIS=29.5%  . Increased risk of breast cancer 05/2014   IBIS=29.5%  . Migraine   . Migraines   .  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 Pancreatitis   . Sjogren's disease ([place])   . Spastic colon   . Thyroid cancer ([place]) 1990 and 1994   Total thyroidectomy with radioactive iodine.     Family History: Family History  Problem Relation Age of Onset  . Arthritis/Rheumatoid Mother        died in age 35s  . Breast cancer Mother 42  . Breast cancer Maternal Grandmother        40's  . Breast cancer Maternal Aunt        X 2. 50's  . Lung cancer Maternal Aunt   . Pancreatitis Neg Hx   . Colon cancer Neg Hx       Review of Systems  Constitutional: Negative for activity change, chills, fatigue and unexpected weight change.  HENT: Negative for congestion, rhinorrhea, sneezing and sore throat.   Respiratory: Positive for shortness of breath and wheezing. Negative for cough and chest tightness.        Well managed with current inhalers .  Cardiovascular: Negative for chest pain and palpitations.  Gastrointestinal: Negative for abdominal pain, constipation, diarrhea, nausea and vomiting.  Genitourinary: Negative for dysuria, frequency, hematuria and urgency.  Musculoskeletal: Positive for  arthralgias and myalgias. Negative for back pain, joint swelling and neck pain.  Skin: Negative for rash.  Allergic/Immunologic: Positive for environmental allergies.  Neurological: Negative for tremors and numbness.  Hematological: Negative for adenopathy. Does not bruise/bleed easily.  Psychiatric/Behavioral: Negative for behavioral problems and sleep disturbance. The patient is nervous/anxious.      Today's Vitals   03/22/19 1003  BP: 114/77  Pulse: 79  Resp: 16  SpO2: 99%  Weight: 186 lb 3.2 oz (84.5 kg)  Height: 5' 5"  (1.651 m)   Body mass index is 30.99 kg/m.  Physical Exam Vitals signs and nursing note reviewed.  Constitutional:      General: She is not in acute distress.    Appearance: Normal appearance. She is well-developed. She is not diaphoretic.  HENT:     Head: Normocephalic and atraumatic.     Mouth/Throat:     Pharynx: No oropharyngeal exudate.  Eyes:     Extraocular Movements: Extraocular movements intact.     Conjunctiva/sclera: Conjunctivae normal.     Pupils: Pupils are equal, round, and reactive to light.  Neck:     Musculoskeletal: Normal range of motion and neck supple.     Thyroid: No thyromegaly.     Vascular: No carotid bruit or JVD.     Trachea: No tracheal deviation.  Cardiovascular:     Rate and Rhythm: Normal rate and regular rhythm.     Pulses: Normal pulses.     Heart sounds: Normal heart sounds. No murmur. No friction rub. No gallop.   Pulmonary:     Effort: Pulmonary effort is normal. No respiratory distress.     Breath sounds: Normal breath sounds. No wheezing or rales.  Chest:     Chest wall: No tenderness.     Breasts:        Right: Normal. No swelling, bleeding, inverted nipple, mass, nipple discharge, skin change or tenderness.        Left: Normal. No swelling, bleeding, inverted nipple, mass, nipple discharge, skin change or tenderness.  Abdominal:     General: Bowel sounds are normal.     Palpations: Abdomen is soft.      Tenderness: There is no abdominal tenderness.  Musculoskeletal: Normal range of motion.  Lymphadenopathy:  Cervical: No cervical adenopathy.  Skin:    General: Skin is warm and dry.  Neurological:     Mental Status: She is alert and oriented to person, place, and time.     Cranial Nerves: No cranial nerve deficit.  Psychiatric:        Behavior: Behavior normal.        Thought Content: Thought content normal.        Judgment: Judgment normal.    Depression screen [facility] 2/9 03/22/2019 02/07/2019 09/07/2018 12/08/2017  Decreased Interest 0 0 0 0  Down, Depressed, Hopeless 0 0 0 0  PHQ - 2 Score 0 0 0 0    Functional Status Survey: Is the patient deaf or have difficulty hearing?: No Does the patient have difficulty seeing, even when wearing glasses/contacts?: No Does the patient have difficulty concentrating, remembering, or making decisions?: Yes Does the patient have difficulty walking or climbing stairs?: Yes(shortness of breath) Does the patient have difficulty dressing or bathing?: No Does the patient have difficulty doing errands alone such as visiting a doctor's office or shopping?: No  MMSE - Mini Mental State Exam 03/22/2019  Orientation to time 5  Orientation to Place 5  Registration 3  Attention/ Calculation 5  Recall 3  Language- name 2 objects 2  Language- repeat 1  Language- follow 3 step command 3  Language- read & follow direction 1  Write a sentence 1  Copy design 1  Total score 30    Fall Risk  03/22/2019 02/28/2019 02/07/2019 09/07/2018 12/08/2017  Falls in the past year? 1 1 1 1  No  Number falls in past yr: 1 0 1 0 -  Injury with Fall? 0 0 0 1 -      LABS: Recent Results (from the past 2160 hour(s))  CBC with Differential/Platelet     Status: None   Collection Time: 03/03/19 10:40 AM  Result Value Ref Range   WBC 5.4 3.4 - 10.8 x10E3/uL   RBC 4.71 3.77 - 5.28 x10E6/uL   Hemoglobin 13.6 11.1 - 15.9 g/dL   Hematocrit 41.2 34.0 - 46.6 %   MCV 88 79 -  97 fL   MCH 28.9 26.6 - 33.0 pg   MCHC 33.0 31.5 - 35.7 g/dL   RDW 12.6 11.7 - 15.4 %   Platelets 366 150 - 450 x10E3/uL   Neutrophils 48 Not Estab. %   Lymphs 37 Not Estab. %   Monocytes 9 Not Estab. %   Eos 5 Not Estab. %   Basos 1 Not Estab. %   Neutrophils Absolute 2.6 1.4 - 7.0 x10E3/uL   Lymphocytes Absolute 2.0 0.7 - 3.1 x10E3/uL   Monocytes Absolute 0.5 0.1 - 0.9 x10E3/uL   EOS (ABSOLUTE) 0.2 0.0 - 0.4 x10E3/uL   Basophils Absolute 0.1 0.0 - 0.2 x10E3/uL   Immature Granulocytes 0 Not Estab. %   Immature Grans (Abs) 0.0 0.0 - 0.1 x10E3/uL  Comprehensive metabolic panel     Status: Abnormal   Collection Time: 03/03/19 10:40 AM  Result Value Ref Range   Glucose 92 65 - 99 mg/dL   BUN 17 8 - 27 mg/dL   Creatinine, Ser 1.01 (H) 0.57 - 1.00 mg/dL   GFR calc non Af Amer 59 (L) >59 mL/min/1.73   GFR calc Af Amer 68 >59 mL/min/1.73   BUN/Creatinine Ratio 17 12 - 28   Sodium 142 134 - 144 mmol/L   Potassium 4.6 3.5 - 5.2 mmol/L   Chloride 105 96 - 106 mmol/L  CO2 22 20 - 29 mmol/L   Calcium 9.6 8.7 - 10.3 mg/dL   Total Protein 7.5 6.0 - 8.5 g/dL   Albumin 4.7 3.8 - 4.8 g/dL   Globulin, Total 2.8 1.5 - 4.5 g/dL   Albumin/Globulin Ratio 1.7 1.2 - 2.2   Bilirubin Total 0.3 0.0 - 1.2 mg/dL   Alkaline Phosphatase 78 39 - 117 IU/L   AST 18 0 - 40 IU/L   ALT 21 0 - 32 IU/L  Lipid Panel With LDL/HDL Ratio     Status: Abnormal   Collection Time: 03/03/19 10:40 AM  Result Value Ref Range   Cholesterol, Total 189 100 - 199 mg/dL   Triglycerides 121 0 - 149 mg/dL   HDL 53 >39 mg/dL   VLDL Cholesterol Cal 24 5 - 40 mg/dL   LDL Calculated 112 (H) 0 - 99 mg/dL   LDl/HDL Ratio 2.1 0.0 - 3.2 ratio    Comment:                                     LDL/HDL Ratio                                             Men  Women                               1/2 Avg.Risk  1.0    1.5                                   Avg.Risk  3.6    3.2                                2X Avg.Risk  6.2    5.0                                 3X Avg.Risk  8.0    6.1   Iron and TIBC     Status: None   Collection Time: 03/03/19 10:40 AM  Result Value Ref Range   Total Iron Binding Capacity 352 250 - 450 ug/dL   UIBC 272 118 - 369 ug/dL   Iron 80 27 - 139 ug/dL   Iron Saturation 23 15 - 55 %  B12 and Folate Panel     Status: None   Collection Time: 03/03/19 10:40 AM  Result Value Ref Range   Vitamin B-12 282 232 - 1,245 pg/mL   Folate 7.3 >3.0 ng/mL    Comment: A serum folate concentration of less than 3.1 ng/mL is considered to represent clinical deficiency.   T4, free     Status: None   Collection Time: 03/03/19 10:40 AM  Result Value Ref Range   Free T4 1.07 0.82 - 1.77 ng/dL  TSH     Status: None   Collection Time: 03/03/19 10:40 AM  Result Value Ref Range   TSH 0.621 0.450 - 4.500 uIU/mL  VITAMIN D 25 Hydroxy (Vit-D Deficiency, Fractures)     Status: None   Collection Time: 03/03/19 10:40  AM  Result Value Ref Range   Vit D, 25-Hydroxy 39.2 30.0 - 100.0 ng/mL    Comment: Vitamin D deficiency has been defined by the [place] practice guideline as a level of serum 25-OH vitamin D less than 20 ng/mL (1,2). The Endocrine Society went on to further define vitamin D insufficiency as a level between 21 and 29 ng/mL (2). 1. IOM (Institute of Medicine). 2010. Dietary reference    intakes for calcium and D. [place]: The    [employer]. 2. Holick MF, Binkley , Bischoff-Ferrari HA, et al.    Evaluation, treatment, and prevention of vitamin D    deficiency: an Endocrine Society clinical practice    guideline. JCEM. 2011 Jul; 96(7):1911-30.   Ferritin     Status: None   Collection Time: 03/03/19 10:40 AM  Result Value Ref Range   Ferritin 78 15 - 150 ng/mL  UA/M w/rflx Culture, Routine     Status: None   Collection Time: 03/22/19 10:30 AM  Result Value Ref Range   Specific Gravity, UA 1.009 1.005 - 1.030   pH, UA 5.5 5.0 - 7.5   Color,  UA Yellow Yellow   Appearance Ur Clear Clear   Leukocytes,UA Negative Negative   Protein,UA Negative Negative/Trace   Glucose, UA Negative Negative   Ketones, UA Negative Negative   RBC, UA Negative Negative   Bilirubin, UA Negative Negative   Urobilinogen, Ur 0.2 0.2 - 1.0 mg/dL   Nitrite, UA Negative Negative   Microscopic Examination Comment     Comment: Microscopic follows if indicated.   Microscopic Examination See below:     Comment: Microscopic was indicated and was performed.   Urinalysis Reflex Comment     Comment: This specimen will not reflex to a Urine Culture.  Microscopic Examination     Status: None   Collection Time: 03/22/19 10:30 AM  Result Value Ref Range   WBC, UA 0-5 0 - 5 /hpf   RBC 0-2 0 - 2 /hpf   Epithelial Cells (non renal) 0-10 0 - 10 /hpf   Casts None seen None seen /lpf   Bacteria, UA Few None seen/Few   Assessment/Plan: 1. Encounter for general adult medical examination with abnormal findings Annual health maintenance exam today.  2. Mild intermittent chronic asthma without complication Stable. Continue inhalers and respiratory medication as prescribed   3. Allergic rhinitis, unspecified seasonality, unspecified trigger Patient given new vial of allergy injection and rx for injections syringes given today. Continue with current injection schedule.  - Tuberculin-Allergy Syringes 28G X 1/2" 1 ML MISC; 1 each by Does not apply route every 14 (fourteen) days. For allergy injections  Dispense: 12 each; Refill: 3  4. Generalized anxiety disorder May continue to take alprazolam 0.51m twice daily as needed for acute anxiety. She should keep new patient appointment with psychiatry as scheduled.  - ALPRAZolam (XANAX) 0.5 MG tablet; Take 1 tablet (0.5 mg total) by mouth 2 (two) times daily as needed. FOR ANXIETY  Dispense: 60 tablet; Refill: 0  5. Dysuria - UA/M w/rflx Culture, Routine  General Counseling: Khatsue simeunderstanding of the findings of  todays visit and agrees with plan of treatment. I have discussed any further diagnostic evaluation that may be needed or ordered today. We also reviewed her medications today. she has been encouraged to call the office with any questions or concerns that should arise related to todays visit.    Counseling:  This patient was  seen by Leretha Pol FNP Collaboration with Dr Lavera Guise as a part of collaborative care agreement  Orders Placed This Encounter  Procedures  . Microscopic Examination  . UA/M w/rflx Culture, Routine    Meds ordered this encounter  Medications  . Tuberculin-Allergy Syringes 28G X 1/2" 1 ML MISC    Sig: 1 each by Does not apply route every 14 (fourteen) days. For allergy injections    Dispense:  12 each    Refill:  3    Order Specific Question:   Supervising Provider    Answer:   Lavera Guise [[id number]]  . ALPRAZolam (XANAX) 0.5 MG tablet    Sig: Take 1 tablet (0.5 mg total) by mouth 2 (two) times daily as needed. FOR ANXIETY    Dispense:  60 tablet    Refill:  0    Order Specific Question:   Supervising Provider    Answer:   Lavera Guise [[id number]]    Time spent: [place]ey[place], MD  Internal Medicine

## 2019-03-23 LAB — UA/M W/RFLX CULTURE, ROUTINE
Bilirubin, UA: NEGATIVE
Glucose, UA: NEGATIVE
Ketones, UA: NEGATIVE
Leukocytes,UA: NEGATIVE
Nitrite, UA: NEGATIVE
Protein,UA: NEGATIVE
RBC, UA: NEGATIVE
Specific Gravity, UA: 1.009 (ref 1.005–1.030)
Urobilinogen, Ur: 0.2 mg/dL (ref 0.2–1.0)
pH, UA: 5.5 (ref 5.0–7.5)

## 2019-03-23 LAB — MICROSCOPIC EXAMINATION: Casts: NONE SEEN /lpf

## 2019-03-23 NOTE — Progress Notes (Signed)
Virtual Visit via Video Note  I connected with Susan [name] on 03/30/19 at 11:00 AM EDT by a video enabled telemedicine application and verified that I am speaking with the correct person using two identifiers.   I discussed the limitations of evaluation and management by telemedicine and the availability of in person appointments. The patient expressed understanding and agreed to proceed.   I discussed the assessment and treatment plan with the patient. The patient was provided an opportunity to ask questions and all were answered. The patient agreed with the plan and demonstrated an understanding of the instructions.   The patient was advised to call back or seek an in-person evaluation if the symptoms worsen or if the condition fails to improve as anticipated.  I provided 50 minutes of non-face-to-face time during this encounter.   [name], MD     Psychiatric Initial Adult Assessment   Patient Identification: Susan [name] MRN:  [id number] Date of Evaluation:  03/30/2019 Referral Source: Self (Dr. Bernita Raisin) Chief Complaint:   Chief Complaint    Psychiatric Evaluation; Anxiety     Visit Diagnosis:    ICD-10-CM   1. GAD (generalized anxiety disorder) F41.1     History of Present Illness:   Susan [name] is a 63 y.o. year old female with a history of anxiety, bilateral osteoarthritis, COPD, s/p thyroidectomy, hypothyroidism, history of Celiac Disease, GERD, Sjogren's syndrome, who is referred for anxiety.   She states that she could not continue to see Dr. Rosine Door as he lost his license. She states that she has been doing very well on her current medication, and does not want to change them. She states that the thought of changing her medication make her feel worse.  She states that she has been struggling with anxiety for many years. She believes that her anxiety originally started when she had dental procedure without pain medication as a child.  She tends to feel  anxious when she is around with people.  She also talks about an example of her becoming anxious when she did not know how to use the computer while she wanted to attend video chart for birthday for her twins nephew.  She also thinks that her anxiety got worsened since she has been taking Xanax twice a day; she states that she used to be taking 4 times a day when she used to see Dr. Rosine Door. She also finds xanax to be helpful for some hand tremors and fibromyalgia. She has muscle tension, difficulty in concentration and initial and middle insomnia. She reports good relationship with her husband of 48 years.  They both place in [place], and has a plan to stay there for a few weeks in July.  She also reports good relationship with her niece and nephew.   She feels fatigue at times. She feels slightly depressed, which she mainly attributes to anxiety. She denies SI. Although she reports intense anxiety, she does not have an episode of panic attacks.  She denies alcohol use or drug use.   Medication- venlafaxine 300 mg daily for may years, xanax 0.5 mg BID, on Elavil for muscle tension   Per PMP Xanax 0.5 mg BID filled on 6/2 for 30 days On carisoprodol  Associated Signs/Symptoms: Depression Symptoms:  depressed mood, anhedonia, insomnia, difficulty concentrating, anxiety, (Hypo) Manic Symptoms:  denies decreased need for sleep, euphoria Anxiety Symptoms:  Excessive Worry, Psychotic Symptoms:  denies AH, VH PTSD Symptoms: NA  Past Psychiatric History:  Outpatient: Dr. Rosine Door  [facility] Psychiatry admission: in 1999, "lost my memory" Previous suicide attempt: denies  Past trials of medication: sertraline, Paxil, venlafaxine, Xanax, Ritalin (worked very well),  History of violence: denies   Previous Psychotropic Medications: Yes   Substance Abuse History in the last 12 months:  No.  Consequences of Substance Abuse: NA  Past Medical History:  Past Medical History:  Diagnosis Date  .  Arthritis   . Asthma   . Asthmatic bronchitis   . BRCA negative 05/2014   MyRisk neg  . Celiac disease   . Celiac sprue   . Chronic anxiety   . Complication of anesthesia    vomiting  . COPD (chronic obstructive pulmonary disease) ([place])   . Depression   . Environmental allergies   . Family history of breast cancer 05/2014   MyRisk neg; IBIS=29.5%  . Fibromyalgia    neuropathy all over  . Gastritis 10/08/2013  . GERD (gastroesophageal reflux disease)   . Hyperlipemia   . Hyperlipidemia    Intolerant to statins  . Hypothyroidism (acquired)   . Increased risk of breast cancer 05/2014   IBIS=29.5%  . Increased risk of breast cancer 05/2014   IBIS=29.5%  . Migraine   . Migraines   . 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 Pancreatitis   . Sjogren's disease ([place])   . Spastic colon   . Thyroid cancer ([place]) 1990 and 1994   Total thyroidectomy with radioactive iodine.     Past Surgical History:  Procedure Laterality Date  . ABDOMINAL HYSTERECTOMY  2006  . BLADDER REPAIR    . BREAST CYST ASPIRATION Left yrs ago  . CHOLECYSTECTOMY  1989  . COLONOSCOPY WITH ESOPHAGOGASTRODUODENOSCOPY (EGD)    . COLONOSCOPY WITH PROPOFOL N/A 08/03/2017   Procedure: COLONOSCOPY WITH PROPOFOL;  Surgeon: Lollie Sails, MD;  Location: [facility] ENDOSCOPY;  Service: Endoscopy;  Laterality: N/A;  . COLONOSCOPY WITH PROPOFOL N/A 10/18/2018   Procedure: COLONOSCOPY WITH PROPOFOL;  Surgeon: Lollie Sails, MD;  Location: [facility] ENDOSCOPY;  Service: Endoscopy;  Laterality: N/A;  . ESOPHAGOGASTRODUODENOSCOPY N/A 10/07/2013   Procedure: ESOPHAGOGASTRODUODENOSCOPY (EGD);  Surgeon: Danie Binder, MD;  Location: [facility] ENDO SUITE;  Service: Endoscopy;  Laterality: N/A;  patient received heparin at 530am given phenergan 76m IV 30 minutes before  . EYE SURGERY     lacrimal gland   . FLEXIBLE BRONCHOSCOPY N/A 06/04/2016   Procedure: FLEXIBLE BRONCHOSCOPY;  Surgeon: SAllyne Gee MD;  Location: [facility] ORS;  Service:  Pulmonary;  Laterality: N/A;  . INCONTINENCE SURGERY  2004  . NASAL SINUS SURGERY    . THYROID LOBECTOMY    . TOTAL THYROIDECTOMY      Family Psychiatric History:  denies  Family History:  Family History  Problem Relation Age of Onset  . Arthritis/Rheumatoid Mother        died in age 8446s . Breast cancer Mother 410 . Breast cancer Maternal Grandmother        40's  . Breast cancer Maternal Aunt        X 2. 50's  . Lung cancer Maternal Aunt   . Pancreatitis Neg Hx   . Colon cancer Neg Hx     Social History:   Social History   Socioeconomic History  . Marital status: Married    Spouse name: Not on file  . Number of children: 0  . Years of education: Not on file  . Highest education level: Not on file  Occupational History  . Not on file  Social Needs  . Financial resource strain: Not on file  . Food insecurity:    Worry: Not on file    Inability: Not on file  . Transportation needs:    Medical: Not on file    Non-medical: Not on file  Tobacco Use  . Smoking status: Never Smoker  . Smokeless tobacco: Never Used  Substance and Sexual Activity  . Alcohol use: No  . Drug use: No  . Sexual activity: Yes    Birth control/protection: Surgical  Lifestyle  . Physical activity:    Days per week: Not on file    Minutes per session: Not on file  . Stress: Not on file  Relationships  . Social connections:    Talks on phone: Not on file    Gets together: Not on file    Attends religious service: Not on file    Active member of club or organization: Not on file    Attends meetings of clubs or organizations: Not on file    Relationship status: Not on file  Other Topics Concern  . Not on file  Social History Narrative  . Not on file    Additional Social History:  She lives with her husband of 73 years, no children,  On disability Work: unemployed.  used to work as Electrical engineer for 29 years Education: graduated form high school, no IEP She grew up  in [place], [place]. She had "two great christian parents."   Allergies:   Allergies  Allergen Reactions  . Aspirin   . Keflex [Cephalexin] Shortness Of Breath  . Azithromycin Hives and Nausea Only  . Barley Grass   . Clarithromycin Other (See Comments)  . Metoclopramide Other (See Comments)    TREMORS Tremors Tremors   . Oat Other (See Comments)  . Oatmeal Other (See Comments)  . Other     Other reaction(s): Other (See Comments) KEOPLEX.  Marland Kitchen Rye Grass Flower Pollen Extract [Gramineae Pollens]   . Vioxx [Rofecoxib] Diarrhea  . Wheat Bran Other (See Comments)    Celiac disease  . Wheat Extract   . Celebrex [Celecoxib] Hives  . Metoclopramide Hcl Anxiety    BODY TREMORS    Metabolic Disorder Labs: No results found for: HGBA1C, MPG No results found for: PROLACTIN Lab Results  Component Value Date   CHOL 189 03/03/2019   TRIG 121 03/03/2019   HDL 53 03/03/2019   LDLCALC 112 (H) 03/03/2019   Lab Results  Component Value Date   TSH 0.621 03/03/2019    Therapeutic Level Labs: No results found for: LITHIUM No results found for: CBMZ No results found for: VALPROATE  Current Medications: Current Outpatient Medications  Medication Sig Dispense Refill  . acetaminophen (TYLENOL) 500 MG tablet Take by mouth.    Marland Kitchen albuterol (PROVENTIL HFA;VENTOLIN HFA) 108 (90 Base) MCG/ACT inhaler Inhale 2 puffs into the lungs every 6 (six) hours as needed for wheezing or shortness of breath. 1 Inhaler 2  . albuterol (PROVENTIL) (2.5 MG/3ML) 0.083% nebulizer solution Take 3 mLs (2.5 mg total) by nebulization every 6 (six) hours as needed for wheezing or shortness of breath. 75 mL 6  . ALPRAZolam (XANAX) 0.5 MG tablet Take 1 tablet (0.5 mg total) by mouth 2 (two) times daily as needed. FOR ANXIETY 60 tablet 0  . amitriptyline (ELAVIL) 25 MG tablet Take 25 mg by mouth at bedtime.    . Bisacodyl (DULCOLAX PO) Take by mouth.    . carisoprodol (SOMA) 350 MG tablet  Take 350 mg by mouth at bedtime.  Patient states that she takes 1/2 tablet nightly    . Dentifrices (BIOTENE DRY MOUTH DT) by Transmucosal route.    . DYMISTA 137-50 MCG/ACT SUSP Use 1 spray(s) in each nostril once daily 23 g 0  . eletriptan (RELPAX) 40 MG tablet Take 40 mg by mouth as needed for migraine. may repeat in 2 hours if necessary     . EPINEPHRINE 0.3 mg/0.3 mL IJ SOAJ injection USE AS DIRECTED 1 Device 2  . ezetimibe (ZETIA) 10 MG tablet Take 1 tablet (10 mg total) by mouth daily. 90 tablet 1  . famotidine (PEPCID) 10 MG tablet Take 20 mg by mouth.    . fenofibrate (TRICOR) 145 MG tablet Take 1 tablet (145 mg total) by mouth daily. 90 tablet 1  . Fluticasone Furoate (ARNUITY ELLIPTA) 100 MCG/ACT AEPB Inhale 2 puffs into the lungs every morning.    . gabapentin (NEURONTIN) 600 MG tablet Take 0.5 tablets (300 mg total) by mouth 2 (two) times daily. 45 tablet 1  . hydroxychloroquine (PLAQUENIL) 200 MG tablet Take by mouth 2 (two) times daily.    . hyoscyamine (NULEV) 0.125 MG TBDP Place 0.125 mg under the tongue every 4 (four) hours as needed for bladder spasms.     . Lactobacillus (DIGESTIVE HEALTH PROBIOTIC PO) Take 1 tablet by mouth daily.    . Levothyroxine Sodium 137 MCG CAPS Take 1 capsule (137 mcg total) by mouth daily before breakfast. 90 capsule 1  . MINIVELLE 0.1 MG/24HR patch Place 1 patch (0.1 mg total) onto the skin 2 (two) times a week. 24 patch 4  . mometasone-formoterol (DULERA) 100-5 MCG/ACT AERO Inhale 2 puffs into the lungs 2 (two) times daily.    . montelukast (SINGULAIR) 10 MG tablet TAKE ONE TABLET BY MOUTH ONCE DAILY FOR ASTHMA 30 tablet 5  . mupirocin ointment (BACTROBAN) 2 %   0  . pantoprazole (PROTONIX) 40 MG tablet Take 40 mg by mouth daily.    Vladimir Faster Glycol-Propyl Glycol 0.4-0.3 % SOLN Apply to eye.    . Polyethylene Glycol 3350 (PEG 3350) POWD AS DIRECTED FOR COLONIC PREP.    Marland Kitchen PRESCRIPTION MEDICATION 1 Syringe by Subdermal route once a week. Allergy shot    . sodium chloride (OCEAN)  0.65 % SOLN nasal spray Place 1 spray into both nostrils as needed for congestion.    . sodium chloride 0.9 % nebulizer solution Take 3 mLs by nebulization 3 (three) times daily.    . Tuberculin-Allergy Syringes 28G X 1/2" 1 ML MISC 1 each by Does not apply route every 14 (fourteen) days. For allergy injections 12 each 3  . Vaginal Lubricant (REPLENS) GEL Place vaginally.    . venlafaxine XR (EFFEXOR-XR) 150 MG 24 hr capsule Take 300 mg by mouth at bedtime.    . Vitamin D, Ergocalciferol, (DRISDOL) 1.25 MG (50000 UT) CAPS capsule Take 1 capsule (50,000 Units total) by mouth every 7 (seven) days. Monday 12 capsule 1  . vitamin E 1000 UNIT capsule Take by mouth.     No current facility-administered medications for this visit.     Musculoskeletal: Strength & Muscle Tone: N/A Gait & Station: N/A Patient leans: N/A  Psychiatric Specialty Exam: Review of Systems  Psychiatric/Behavioral: Positive for depression. Negative for hallucinations, memory loss, substance abuse and suicidal ideas. The patient is nervous/anxious and has insomnia.   All other systems reviewed and are negative.   There were no vitals taken for this visit.There  is no height or weight on file to calculate BMI.  General Appearance: Fairly Groomed  Eye Contact:  Good  Speech:  Clear and Coherent  Volume:  Normal  Mood:  Anxious  Affect:  Appropriate, Congruent and slightly tense  Thought Process:  Coherent  Orientation:  Full (Time, Place, and Person)  Thought Content:  Logical  Suicidal Thoughts:  No  Homicidal Thoughts:  No  Memory:  Immediate;   Good  Judgement:  Good  Insight:  Fair  Psychomotor Activity:  Normal  Concentration:  Concentration: Good and Attention Span: Good  Recall:  Good  Fund of Knowledge:Good  Language: Good  Akathisia:  No  Handed:  Right  AIMS (if indicated):  not done  Assets:  Communication Skills Desire for Improvement  ADL's:  Intact  Cognition: WNL  Sleep:  Poor   Lab Results   Component Value Date   TSH 0.621 03/03/2019    Screenings: Mini-Mental     Clinical Support from 03/22/2019 in [facility]  Total Score (max 30 points )  30    PHQ2-9     Clinical Support from 03/22/2019 in [facility] Office Visit from 02/07/2019 in [facility] Office Visit from 09[facility]07/2018 in [facility], [facility] Office Visit from 12/08/2017 in [facility], [facility]  PHQ-2 Total Score  0  0  0  0      Assessment and Plan:  ADDYSYN FERN is a 63 y.o. year old female with a history of anxiety, bilateral osteoarthritis, COPD, s/p thyroidectomy, hypothyroidism, history of Celiac Disease, GERD, Sjogren's syndrome, who is referred for anxiety. She is transferred from Dr. Rosine Door.  # GAD # Social anxiety # r/o MDD Patient reports anxiety since child/which she attributes to the event of dental procedure without pain medication as a child. Although she did not elaborate any psychosocial stressors on today's visit, she also reports social anxiety and does have many medical condition.  Although she may benefit from adjunctive treatment for anxiety, she has strong preference to stay on current medication (except that she would like uptitration of xanax).  Will continue venlafaxine to target anxiety.  Will continue Xanax as needed for anxiety; discussed risk of dependence and oversedation.  Will benefit from CBT; will make referral.   Plan 1. Continue venlafaxine 300 mg daily (declined refill) 2. Continue Xanax 0.5 mg twice a day as needed for anxiety  3. Next appointment: 7/14 at 9 AM for 30 mins 4. Referral to therapy  - on amitriptyline 25 mg at night for muscle tension (anticholinergic side effect from higher dose) - on carisoprodol  The patient demonstrates the following risk factors for suicide: Chronic risk factors for suicide include: psychiatric disorder of anxiety. Acute risk factors for suicide include: N/A.  Protective factors for this patient include: positive social support, responsibility to others (children, family), coping skills and hope for the future. Considering these factors, the overall suicide risk at this point appears to be low. Patient is appropriate for outpatient follow up.   [name], MD 6/3/202011:43 AM

## 2019-03-30 ENCOUNTER — Ambulatory Visit (INDEPENDENT_AMBULATORY_CARE_PROVIDER_SITE_OTHER): Payer: Medicare Other | Admitting: Psychiatry

## 2019-03-30 ENCOUNTER — Other Ambulatory Visit: Payer: Self-pay

## 2019-03-30 ENCOUNTER — Encounter (HOSPITAL_COMMUNITY): Payer: Self-pay | Admitting: Psychiatry

## 2019-03-30 DIAGNOSIS — F411 Generalized anxiety disorder: Secondary | ICD-10-CM | POA: Diagnosis not present

## 2019-03-30 NOTE — Patient Instructions (Addendum)
1. Continue venlafaxine 300 mg daily  2. Continue Xanax 0.5 mg twice a day as needed for anxiety  3. Next appointment: 7/14 at 9 AM  4. Referral to therapy

## 2019-03-31 ENCOUNTER — Telehealth (HOSPITAL_COMMUNITY): Payer: Self-pay | Admitting: Psychiatry

## 2019-03-31 NOTE — Telephone Encounter (Signed)
Reviewed note from Dr. Rosine Door. Most recent note includes 11/01/2018.   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 No UDS in the chart.  Medication: Effexor 300 mg daily, Xanax 0.5 mg QID, amitriptyline 25 mg, gabapentin 800 mg TID

## 2019-04-03 DIAGNOSIS — R300 Dysuria: Secondary | ICD-10-CM | POA: Insufficient documentation

## 2019-04-04 DIAGNOSIS — M3500 Sicca syndrome, unspecified: Secondary | ICD-10-CM | POA: Diagnosis not present

## 2019-05-02 NOTE — Progress Notes (Signed)
Virtual Visit via Video Note  I connected with Susan [name] on 05/10/19 at  9:00 AM EDT by a video enabled telemedicine application and verified that I am speaking with the correct person using two identifiers.   I discussed the limitations of evaluation and management by telemedicine and the availability of in person appointments. The patient expressed understanding and agreed to proceed.    I discussed the assessment and treatment plan with the patient. The patient was provided an opportunity to ask questions and all were answered. The patient agreed with the plan and demonstrated an understanding of the instructions.   The patient was advised to call back or seek an in-person evaluation if the symptoms worsen or if the condition fails to improve as anticipated.  I provided 25 minutes of non-face-to-face time during this encounter.   [name], MD    [facility] MD/PA/NP OP Progress Note  05/10/2019 9:37 AM Susan [name]  MRN:  [id number]  Chief Complaint:  Chief Complaint    Anxiety; Follow-up     HPI:  This is a follow-up appointment for anxiety and depression.  She states that she has been doing well.  She will return from [place] 2 weeks ago. They owns a property for the past 1.5 year since her parents deceased. Although she still misses her parents (including her step mother of 28 years), she has been able to enjoy things.  She talks about an excellent when she used to take care of her parents in [place].  She believes that she is doing okay now.  She continues to take Xanax twice a day.  She states that she takes it to relax her muscle.  She was more irritable when she ran out of her medication.  She has middle insomnia, although she usually feels refreshed in the morning.  She has good motivation and energy.  She feels depressed at times, she thinks that her deceased family.  She has difficulty in concentration.  She denies SI.  She feels anxious at times.  She denies  panic attacks.   Memory-she believes she has memory loss since 1999 when she was admitted for depression.  She could not continue her job as she could not tolerate it given she used to have good memory. She tends to forget appointment and the contents of movies. She could not remember names of her co-workers.   Medical condition- she has been suffering from Celiac disease, Sjogren, a couple of surgeries, migraine. She makes sure to eat appropriate diet. Although she struggles with her medical condition, she thinks she has been handling it well.    Routine day- cleaning, crafts, swimming, ride bicycles, used to attend panting classes in [place] before pandemic,    Functional Status Instrumental Activities of Daily Living (IADLs):  Susan [name] is independent in the following: managing finances, medications, driving  Activities of Daily Living (ADLs):  Susan [name] is independent in the following: bathing and hygiene, feeding, continence, grooming and toileting, walking  Visit Diagnosis:    ICD-10-CM   1. GAD (generalized anxiety disorder)  F41.1   2. Mild episode of recurrent major depressive disorder (HCC)  F33.0   3. Generalized anxiety disorder  F41.1 Susan [name] (XANAX) 0.5 MG tablet    Past Psychiatric History: Please see initial evaluation for full details. I have reviewed the history. No updates at this time.     Past Medical History:  Past Medical History:  Diagnosis Date  . Arthritis   .  Asthma   . Asthmatic bronchitis   . BRCA negative 05/2014   MyRisk neg  . Celiac disease   . Celiac sprue   . Chronic anxiety   . Complication of anesthesia    vomiting  . COPD (chronic obstructive pulmonary disease) ([place])   . Depression   . Environmental allergies   . Family history of breast cancer 05/2014   MyRisk neg; IBIS=29.5%  . Fibromyalgia    neuropathy all over  . Gastritis 10/08/2013  . GERD (gastroesophageal reflux disease)   . Hyperlipemia   .  Hyperlipidemia    Intolerant to statins  . Hypothyroidism (acquired)   . Increased risk of breast cancer 05/2014   IBIS=29.5%  . Increased risk of breast cancer 05/2014   IBIS=29.5%  . Migraine   . Migraines   . 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 Pancreatitis   . Sjogren's disease ([place])   . Spastic colon   . Thyroid cancer ([place]) 1990 and 1994   Total thyroidectomy with radioactive iodine.     Past Surgical History:  Procedure Laterality Date  . ABDOMINAL HYSTERECTOMY  2006  . BLADDER REPAIR    . BREAST CYST ASPIRATION Left yrs ago  . CHOLECYSTECTOMY  1989  . COLONOSCOPY WITH ESOPHAGOGASTRODUODENOSCOPY (EGD)    . COLONOSCOPY WITH PROPOFOL N/A 08/03/2017   Procedure: COLONOSCOPY WITH PROPOFOL;  Surgeon: Lollie Sails, MD;  Location: [facility] ENDOSCOPY;  Service: Endoscopy;  Laterality: N/A;  . COLONOSCOPY WITH PROPOFOL N/A 10/18/2018   Procedure: COLONOSCOPY WITH PROPOFOL;  Surgeon: Lollie Sails, MD;  Location[facility] ENDOSCOPY;  Service: Endoscopy;  Laterality: N/A;  . ESOPHAGOGASTRODUODENOSCOPY N/A 10/07/2013   Procedure: ESOPHAGOGASTRODUODENOSCOPY (EGD);  Surgeon: Danie Binder, MD;  Location: [facility] ENDO SUITE;  Service: Endoscopy;  Laterality: N/A;  patient received heparin at 530am given phenergan 48m IV 30 minutes before  . EYE SURGERY     lacrimal gland   . FLEXIBLE BRONCHOSCOPY N/A 06/04/2016   Procedure: FLEXIBLE BRONCHOSCOPY;  Surgeon: SAllyne Gee MD;  Location: [facility] ORS;  Service: Pulmonary;  Laterality: N/A;  . INCONTINENCE SURGERY  2004  . NASAL SINUS SURGERY    . THYROID LOBECTOMY    . TOTAL THYROIDECTOMY      Family Psychiatric History: Please see initial evaluation for full details. I have reviewed the history. No updates at this time.     Family History:  Family History  Problem Relation Age of Onset  . Arthritis/Rheumatoid Mother        died in age 4487s . Breast cancer Mother 445 . Breast cancer Maternal Grandmother        40's  . Breast cancer  Maternal Aunt        X 2. 50's  . Lung cancer Maternal Aunt   . Pancreatitis Neg Hx   . Colon cancer Neg Hx     Social History:  Social History   Socioeconomic History  . Marital status: Married    Spouse name: Not on file  . Number of children: 0  . Years of education: Not on file  . Highest education level: Not on file  Occupational History  . Not on file  Social Needs  . Financial resource strain: Not on file  . Food insecurity    Worry: Not on file    Inability: Not on file  . Transportation needs    Medical: Not on file    Non-medical: Not on file  Tobacco Use  . Smoking status: Never  Smoker  . Smokeless tobacco: Never Used  Substance and Sexual Activity  . Alcohol use: No  . Drug use: No  . Sexual activity: Yes    Birth control/protection: Surgical  Lifestyle  . Physical activity    Days per week: Not on file    Minutes per session: Not on file  . Stress: Not on file  Relationships  . Social Herbalist on phone: Not on file    Gets together: Not on file    Attends religious service: Not on file    Active member of club or organization: Not on file    Attends meetings of clubs or organizations: Not on file    Relationship status: Not on file  Other Topics Concern  . Not on file  Social History Narrative  . Not on file    Allergies:  Allergies  Allergen Reactions  . Aspirin   . Keflex [Cephalexin] Shortness Of Breath  . Azithromycin Hives and Nausea Only  . Barley Grass   . Clarithromycin Other (See Comments)  . Metoclopramide Other (See Comments)    TREMORS Tremors Tremors   . Oat Other (See Comments)  . Oatmeal Other (See Comments)  . Other     Other reaction(s): Other (See Comments) KEOPLEX.  Marland Kitchen Rye Grass Flower Pollen Extract [Gramineae Pollens]   . Vioxx [Rofecoxib] Diarrhea  . Wheat Bran Other (See Comments)    Celiac disease  . Wheat Extract   . Celebrex [Celecoxib] Hives  . Metoclopramide Hcl Anxiety    BODY TREMORS     Metabolic Disorder Labs: No results found for: HGBA1C, MPG No results found for: PROLACTIN Lab Results  Component Value Date   CHOL 189 03/03/2019   TRIG 121 03/03/2019   HDL 53 03/03/2019   LDLCALC 112 (H) 03/03/2019   Lab Results  Component Value Date   TSH 0.621 03/03/2019   TSH 0.962 10/07/2013    Therapeutic Level Labs: No results found for: LITHIUM No results found for: VALPROATE No components found for:  CBMZ  Current Medications: Current Outpatient Medications  Medication Sig Dispense Refill  . acetaminophen (TYLENOL) 500 MG tablet Take by mouth.    Marland Kitchen albuterol (PROVENTIL HFA;VENTOLIN HFA) 108 (90 Base) MCG/ACT inhaler Inhale 2 puffs into the lungs every 6 (six) hours as needed for wheezing or shortness of breath. 1 Inhaler 2  . albuterol (PROVENTIL) (2.5 MG/3ML) 0.083% nebulizer solution Take 3 mLs (2.5 mg total) by nebulization every 6 (six) hours as needed for wheezing or shortness of breath. 75 mL 6  . [START ON 06/03/2019] Susan [name] (XANAX) 0.5 MG tablet Take 1 tablet (0.5 mg total) by mouth 2 (two) times daily as needed. FOR ANXIETY 60 tablet 0  . amitriptyline (ELAVIL) 25 MG tablet Take 1 tablet (25 mg total) by mouth at bedtime. 90 tablet 0  . Bisacodyl (DULCOLAX PO) Take by mouth.    . carisoprodol (SOMA) 350 MG tablet Take 350 mg by mouth at bedtime. Patient states that she takes 1/2 tablet nightly    . Dentifrices (BIOTENE DRY MOUTH DT) by Transmucosal route.    . DYMISTA 137-50 MCG/ACT SUSP Use 1 spray(s) in each nostril once daily 23 g 0  . eletriptan (RELPAX) 40 MG tablet Take 40 mg by mouth as needed for migraine. may repeat in 2 hours if necessary     . EPINEPHRINE 0.3 mg/0.3 mL IJ SOAJ injection USE AS DIRECTED 1 Device 2  . ezetimibe (ZETIA) 10 MG tablet  Take 1 tablet (10 mg total) by mouth daily. 90 tablet 1  . famotidine (PEPCID) 10 MG tablet Take 20 mg by mouth.    . fenofibrate (TRICOR) 145 MG tablet Take 1 tablet (145 mg total) by mouth daily.  90 tablet 1  . Fluticasone Furoate (ARNUITY ELLIPTA) 100 MCG/ACT AEPB Inhale 2 puffs into the lungs every morning.    . gabapentin (NEURONTIN) 600 MG tablet Take 0.5 tablets (300 mg total) by mouth 2 (two) times daily. 45 tablet 0  . hydroxychloroquine (PLAQUENIL) 200 MG tablet Take by mouth 2 (two) times daily.    . hyoscyamine (NULEV) 0.125 MG TBDP Place 0.125 mg under the tongue every 4 (four) hours as needed for bladder spasms.     . Lactobacillus (DIGESTIVE HEALTH PROBIOTIC PO) Take 1 tablet by mouth daily.    . Levothyroxine Sodium 137 MCG CAPS Take 1 capsule (137 mcg total) by mouth daily before breakfast. 90 capsule 1  . MINIVELLE 0.1 MG/24HR patch PLACE 1 PATCH ONTO THE SKIN 2 TIMES A WEEK 8 patch 0  . mometasone-formoterol (DULERA) 100-5 MCG/ACT AERO Inhale 2 puffs into the lungs 2 (two) times daily.    . montelukast (SINGULAIR) 10 MG tablet TAKE ONE TABLET BY MOUTH ONCE DAILY FOR ASTHMA 30 tablet 5  . mupirocin ointment (BACTROBAN) 2 %   0  . pantoprazole (PROTONIX) 40 MG tablet Take 40 mg by mouth daily.    Vladimir Faster Glycol-Propyl Glycol 0.4-0.3 % SOLN Apply to eye.    . Polyethylene Glycol 3350 (PEG 3350) POWD AS DIRECTED FOR COLONIC PREP.    Marland Kitchen PRESCRIPTION MEDICATION 1 Syringe by Subdermal route once a week. Allergy shot    . sodium chloride (OCEAN) 0.65 % SOLN nasal spray Place 1 spray into both nostrils as needed for congestion.    . sodium chloride 0.9 % nebulizer solution Take 3 mLs by nebulization 3 (three) times daily.    . Tuberculin-Allergy Syringes 28G X 1/2" 1 ML MISC 1 each by Does not apply route every 14 (fourteen) days. For allergy injections 12 each 3  . Vaginal Lubricant (REPLENS) GEL Place vaginally.    . venlafaxine XR (EFFEXOR-XR) 150 MG 24 hr capsule Take 2 capsules (300 mg total) by mouth at bedtime. 180 capsule 0  . Vitamin D, Ergocalciferol, (DRISDOL) 1.25 MG (50000 UT) CAPS capsule Take 1 capsule (50,000 Units total) by mouth every 7 (seven) days. Monday 12  capsule 1  . vitamin E 1000 UNIT capsule Take by mouth.     No current facility-administered medications for this visit.      Musculoskeletal: Strength & Muscle Tone: N/A Gait & Station: N/A Patient leans: N/A  Psychiatric Specialty Exam: Review of Systems  Psychiatric/Behavioral: Positive for depression and memory loss. Negative for hallucinations, substance abuse and suicidal ideas. The patient is nervous/anxious and has insomnia.   All other systems reviewed and are negative.   There were no vitals taken for this visit.There is no height or weight on file to calculate BMI.  General Appearance: Fairly Groomed  Eye Contact:  Good  Speech:  Clear and Coherent  Volume:  Normal  Mood:  "good"  Affect:  Appropriate, Congruent and Full Range  Thought Process:  Coherent  Orientation:  Full (Time, Place, and Person)  Thought Content: Logical   Suicidal Thoughts:  No  Homicidal Thoughts:  No  Memory:  Immediate;   Good  Judgement:  Good  Insight:  Good  Psychomotor Activity:  Normal  Concentration:  Concentration: Good and Attention Span: Good  Recall:  Good  Fund of Knowledge: Good  Language: Good  Akathisia:  No  Handed:  Right  AIMS (if indicated): not done  Assets:  Communication Skills Desire for Improvement  ADL's:  Intact  Cognition: WNL  Sleep:  Poor   Screenings: Mini-Mental     Clinical Support from 03/22/2019 in [facility], [facility]  Total Score (max 30 points )  30    PHQ2-9     Clinical Support from 03/22/2019 in [facility], [facility] Office Visit from 02/07/2019 in [facility], [facility] Office Visit from 09/07/2018 in [facility], [facility] Office Visit from 12/08/2017 in [facility], [facility]  PHQ-2 Total Score  0  0  0  0      Assessment and Plan:  Susan [name] is a 63 y.o. year old female with a history of anxiety, bilateral osteoarthritis, COPD, s/p thyroidectomy, hypothyroidism, history of Celiac Disease,  GERD, Sjogren's syndrome, who presents for follow up appointment for anxiety.   # GAD # Social anxiety  # MDD, recurrent, mild  Although the patient reports occasional depressive symptoms and anxiety, it has been relatively well managed since the last visit.  Psychosocial stressors including loss of her parents a few years ago, and medical condition includes her pain.  We continue current dose of venlafaxine to target anxiety and depression. Will continue amitriptyline for depression, anxiety and pain.  Continue Xanax as needed for anxiety; discussed risk of dependence and oversedation.  She will benefit from CBT; referral is made.   # Memory loss She complains of short-term memory loss since she was admitted to psychiatry unit for depression in 1999. IADL independent. Will consider MOCA as indicated in the future.    Plan I have reviewed and updated plans as below 1. Continue venlafaxine 300 mg daily (declined refill) 2. Continue amitriptyline 25 mg at night (anticholinergic side effect from higher dose) 3. Continue Xanax 0.5 mg twice a day as needed for anxiety  4. Next appointment: 9/9 at 9 AM for 20 mins, video 5. Referred to therapy - on carisoprodol  Past trials of medication: sertraline, Paxil, venlafaxine, Xanax, Ritalin (worked very well)   The patient demonstrates the following risk factors for suicide: Chronic risk factors for suicide include: psychiatric disorder of anxiety. Acute risk factors for suicide include: N/A. Protective factors for this patient include: positive social support, responsibility to others (children, family), coping skills and hope for the future. Considering these factors, the overall suicide risk at this point appears to be low. She owns a gun. Patient is appropriate for outpatient follow up.  The duration of this appointment visit was 25 minutes of non face-to-face time with the patient.  Greater than 50% of this time was spent in counseling,  explanation of  diagnosis, planning of further management, and coordination of care.  [name], MD 05/10/2019, 9:37 AM

## 2019-05-03 ENCOUNTER — Other Ambulatory Visit: Payer: Self-pay | Admitting: Obstetrics & Gynecology

## 2019-05-03 ENCOUNTER — Other Ambulatory Visit: Payer: Self-pay | Admitting: Adult Health

## 2019-05-03 DIAGNOSIS — M797 Fibromyalgia: Secondary | ICD-10-CM

## 2019-05-04 ENCOUNTER — Other Ambulatory Visit: Payer: Self-pay

## 2019-05-04 ENCOUNTER — Other Ambulatory Visit: Payer: Self-pay | Admitting: Adult Health

## 2019-05-04 ENCOUNTER — Other Ambulatory Visit (HOSPITAL_COMMUNITY): Payer: Self-pay | Admitting: Psychiatry

## 2019-05-04 DIAGNOSIS — M797 Fibromyalgia: Secondary | ICD-10-CM

## 2019-05-04 DIAGNOSIS — F411 Generalized anxiety disorder: Secondary | ICD-10-CM

## 2019-05-04 MED ORDER — GABAPENTIN 600 MG PO TABS: 300.0000 mg | ORAL_TABLET | Freq: Two times a day (BID) | ORAL | 0 refills | Status: DC

## 2019-05-04 MED ORDER — ALPRAZOLAM 0.5 MG PO TABS: 0.5000 mg | ORAL_TABLET | Freq: Two times a day (BID) | ORAL | 0 refills | Status: DC | PRN

## 2019-05-05 ENCOUNTER — Other Ambulatory Visit: Payer: Self-pay

## 2019-05-05 DIAGNOSIS — M797 Fibromyalgia: Secondary | ICD-10-CM

## 2019-05-05 MED ORDER — GABAPENTIN 600 MG PO TABS: 300.0000 mg | ORAL_TABLET | Freq: Two times a day (BID) | ORAL | 0 refills | Status: DC

## 2019-05-05 NOTE — Telephone Encounter (Signed)
Called in phar for gabapentin refills jody try esend is not able to esend

## 2019-05-10 ENCOUNTER — Ambulatory Visit (INDEPENDENT_AMBULATORY_CARE_PROVIDER_SITE_OTHER): Payer: Medicare Other | Admitting: Psychiatry

## 2019-05-10 ENCOUNTER — Encounter (HOSPITAL_COMMUNITY): Payer: Self-pay | Admitting: Psychiatry

## 2019-05-10 ENCOUNTER — Other Ambulatory Visit: Payer: Self-pay

## 2019-05-10 DIAGNOSIS — F330 Major depressive disorder, recurrent, mild: Secondary | ICD-10-CM

## 2019-05-10 DIAGNOSIS — F411 Generalized anxiety disorder: Secondary | ICD-10-CM

## 2019-05-10 MED ORDER — VENLAFAXINE HCL ER 150 MG PO CP24: 300.0000 mg | ORAL_CAPSULE | Freq: Every day | ORAL | 0 refills | Status: DC

## 2019-05-10 MED ORDER — ALPRAZOLAM 0.5 MG PO TABS: 0.5000 mg | ORAL_TABLET | Freq: Two times a day (BID) | ORAL | 0 refills | Status: DC | PRN

## 2019-05-10 MED ORDER — AMITRIPTYLINE HCL 25 MG PO TABS: 25.0000 mg | ORAL_TABLET | Freq: Every day | ORAL | 0 refills | Status: DC

## 2019-05-10 NOTE — Patient Instructions (Signed)
1. Continue venlafaxine 300 mg daily  2. Continue amitriptyline 25 mg at night  3. Continue Xanax 0.5 mg twice a day as needed for anxiety  4. Next appointment: 9/9 at 9 AM

## 2019-05-13 ENCOUNTER — Encounter (HOSPITAL_COMMUNITY): Payer: Self-pay | Admitting: Licensed Clinical Social Worker

## 2019-05-13 ENCOUNTER — Ambulatory Visit (INDEPENDENT_AMBULATORY_CARE_PROVIDER_SITE_OTHER): Payer: Medicare Other | Admitting: Licensed Clinical Social Worker

## 2019-05-13 ENCOUNTER — Telehealth: Payer: Self-pay

## 2019-05-13 ENCOUNTER — Other Ambulatory Visit: Payer: Self-pay

## 2019-05-13 DIAGNOSIS — F330 Major depressive disorder, recurrent, mild: Secondary | ICD-10-CM | POA: Diagnosis not present

## 2019-05-13 DIAGNOSIS — F411 Generalized anxiety disorder: Secondary | ICD-10-CM

## 2019-05-13 DIAGNOSIS — M797 Fibromyalgia: Secondary | ICD-10-CM

## 2019-05-13 MED ORDER — GABAPENTIN 600 MG PO TABS: 300.0000 mg | ORAL_TABLET | Freq: Two times a day (BID) | ORAL | 0 refills | Status: DC

## 2019-05-13 NOTE — Telephone Encounter (Signed)
Pt calling; trying to schedule mammogram in Dec, in [facility] a lot; has appt c [facility] on the 14th at 1:30; has an am appt on the 14th; other than that anytime is fine.  [phone]

## 2019-05-13 NOTE — Progress Notes (Signed)
Comprehensive Clinical Assessment (CCA) Note  05/13/2019 Susan [name] [id number]  Visit Diagnosis:      ICD-10-CM   1. GAD (generalized anxiety disorder)  F41.1   2. Mild episode of recurrent major depressive disorder (HCC)  F33.0       CCA Part One  Part One has been completed on paper by the patient.  (See scanned document in Chart Review)  CCA Part Two A  Intake/Chief Complaint:  CCA Intake With Chief Complaint CCA Part Two Date: 05/13/19 CCA Part Two Time: 0907 Chief Complaint/Presenting Problem: Depression and Anxiety Patients Currently Reported Symptoms/Problems: Mood: memory issues,  if she doesn't take medication she feels down,   Anxiety: anxiety around people doesn't know, muscle tension, lack of confidence, nervous, scratch head/picks skin, difficulty with sleep, Collateral Involvement: None Individual's Strengths: Susan [name], tries to be kind Individual's Preferences: Doesn't prefer drama, prefers to be outside [employer]: Susan [name], Engineer, site. crafts Type of Services Patient Feels Are Needed: Therapy, medication Initial Clinical Notes/Concerns: Symptoms started in elementary school, symptoms occur a few days a week, symptoms are mild  Mental Health Symptoms Depression:  Depression: Difficulty Concentrating  Mania:  Mania: N/A  Anxiety:   Anxiety: Worrying, Restlessness, Sleep, Difficulty concentrating  Psychosis:  Psychosis: N/A  Trauma:  Trauma: N/A  Obsessions:  Obsessions: N/A  Compulsions:  Compulsions: N/A  Inattention:  Inattention: N/A  Hyperactivity/Impulsivity:  Hyperactivity/Impulsivity: N/A  Oppositional/Defiant Behaviors:  Oppositional/Defiant Behaviors: N/A  Borderline Personality:  Emotional Irregularity: N/A  Other Mood/Personality Symptoms:  Other Mood/Personality Symtpoms: N/A   Mental Status Exam Appearance and self-care  Stature:  Stature: Average  Weight:  Weight: Average weight  Clothing:  Clothing: Casual  Grooming:  Grooming: Normal   Cosmetic use:  Cosmetic Use: Age appropriate  Posture/gait:  Posture/Gait: Normal  Motor activity:   Normal   Sensorium  Attention:  Attention: Normal  Concentration:  Concentration: Normal  Orientation:   Normal   Recall/memory:  Recall/Memory: Normal  Affect and Mood  Affect:  Affect: Anxious  Mood:  Mood: Anxious  Relating  Eye contact:  Eye Contact: Normal  Facial expression:  Facial Expression: Responsive  Attitude toward examiner:  Attitude Toward Examiner: Cooperative  Thought and Language  Speech flow: Speech Flow: Normal  Thought content:  Thought Content: Appropriate to mood and circumstances  Preoccupation:  Preoccupations: (N/A)  Hallucinations:  Hallucinations: (N/A)  Organization:   Logical   Transport planner of Knowledge:  Fund of Knowledge: Average  Intelligence:  Intelligence: Average  Abstraction:  Abstraction: Normal  Judgement:  Judgement: Normal  Reality Testing:  Reality Testing: Adequate  Insight:  Insight: Fair  Decision Making:  Decision Making: Normal  Social Functioning  Social Maturity:  Social Maturity: Responsible  Social Judgement:  Social Judgement: Normal  Stress  Stressors:  Stressors: Illness, Transitions  Coping Ability:  Coping Ability: English as a second language teacher Deficits:   Being around people   Supports:   Spouse   Family and Psychosocial History: Family history Marital status: Married Number of Years Married: 61 What types of issues is patient dealing with in the relationship?: None Additional relationship information: None Are you sexually active?: Yes What is your sexual orientation?: Heterosexual Has your sexual activity been affected by drugs, alcohol, medication, or emotional stress?: None Does patient have children?: No  Childhood History:  Childhood History By whom was/is the patient raised?: Both parents Additional childhood history information: Patient describes childhood as peaceful and loving Description of  patient's relationship with caregiver when they  were a child: Mother: Good relationship,  Father: Good relationship Patient's description of current relationship with people who raised him/her: Mother: Deceased,   Susan [name]: deceased,  Father: Deceased How were you disciplined when you got in trouble as a child/adolescent?: Spanking Does patient have siblings?: Yes Number of Siblings: 1 Description of patient's current relationship with siblings: Brother, close relationship Did patient suffer any verbal/emotional/physical/sexual abuse as a child?: No Did patient suffer from severe childhood neglect?: No Has patient ever been sexually abused/assaulted/raped as an adolescent or adult?: No Was the patient ever a victim of a crime or a disaster?: No Witnessed domestic violence?: No Has patient been effected by domestic violence as an adult?: No  CCA Part Two B  Employment/Work Situation: Employment / Work Copywriter, advertising Employment situation: On disability Why is patient on disability: Depression How long has patient been on disability: Since 2001 Patient's job has been impacted by current illness: No What is the longest time patient has a held a job?: 15 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 Did You Receive Any Psychiatric Treatment/Services While in the [employer]?: No Are There Guns or Other Weapons in [place]?: Yes Types of Guns/Weapons: Handguns, rifles, shotgun Are These Psychologist, educational?: Yes  Education: Education School Currently Attending: N/A Last Grade Completed: 12 Name of [place]: [place] Did Teacher, adult education From [employer]?: Yes Did [place]?: No Did [place]?: No Did You Have Any Special Interests In School?: N/A Did You Have An Individualized Education Program (IIEP): No Did You Have Any Difficulty At School?: Yes Were Any Medications Ever Prescribed For These Difficulties?:  No  Religion: Religion/Spirituality Are You A Religious Person?: Yes What is Your Religious Affiliation?: Baptist How Might This Affect Treatment?: Support in treatment  Leisure/Recreation: Leisure / Recreation Leisure and Hobbies: Art, crafts  Exercise/Diet: Exercise/Diet Do You Exercise?: Yes What Type of Exercise Do You Do?: Run/Walk How Many Times a Week Do You Exercise?: Daily Have You Gained or Lost A Significant Amount of Weight in the Past Six Months?: Yes-Gained Number of Pounds Gained: 10 Do You Follow a Special Diet?: No Do You Have Any Trouble Sleeping?: No  CCA Part Two C  Alcohol/Drug Use: Alcohol / Drug Use Pain Medications: Denies Prescriptions: Denies Over the Counter: Denies History of alcohol / drug use?: No history of alcohol / drug abuse                      CCA Part Three  ASAM's:  Six Dimensions of Multidimensional Assessment  Dimension 1:  Acute Intoxication and/or Withdrawal Potential:  Dimension 1:  Comments: None  Dimension 2:  Biomedical Conditions and Complications:  Dimension 2:  Comments: None  Dimension 3:  Emotional, Behavioral, or Cognitive Conditions and Complications:  Dimension 3:  Comments: None  Dimension 4:  Readiness to Change:  Dimension 4:  Comments: None  Dimension 5:  Relapse, Continued use, or Continued Problem Potential:  Dimension 5:  Comments: None  Dimension 6:  Recovery/Living Environment:  Dimension 6:  Recovery/Living Environment Comments: None   Substance use Disorder (SUD)    Social Function:  Social Functioning Social Maturity: Responsible Social Judgement: Normal  Stress:  Stress Stressors: Illness, Transitions Coping Ability: Overwhelmed Patient Takes Medications The Way The Doctor Instructed?: Yes Priority Risk: Low Acuity  Risk Assessment- Self-Harm Potential: Risk Assessment For Self-Harm Potential Thoughts of Self-Harm: No current thoughts Method: No plan Availability of Means: No  access/NA  Risk Assessment -Dangerous to Others Potential: Risk Assessment For Dangerous to Others Potential Method: No Plan Availability of Means: No access or NA Intent: Vague intent or NA Notification Required: No need or identified person  DSM5 Diagnoses: Patient Active Problem List   Diagnosis Date Noted  . Dysuria 04/03/2019  . Generalized anxiety disorder 02/28/2019  . Chronic pain of both knees 03/01/2018  . Encounter for general adult medical examination with abnormal findings 03/01/2018  . Primary osteoarthritis of both first carpometacarpal joints 03/01/2018  . Chronic obstructive pulmonary disease, unspecified ([place]) 12/04/2017  . Shortness of breath 12/04/2017  . Allergic rhinitis, unspecified 12/04/2017  . 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 Vaginal atrophy 09/01/2017  . Chronic right-sided low back pain without sciatica 01/05/2017  . Primary Sjogren's syndrome ([place]) 01/05/2017  . CD (celiac disease) 04/17/2015  . Gastroesophageal reflux disease with esophagitis 04/17/2015  . Hx of Sjogren's disease ([place]) 04/17/2015  . Acute asthma exacerbation 10/14/2013  . Acute respiratory failure ([place]) 10/14/2013  . Gastritis 10/08/2013  . Bradycardia 10/07/2013  . Esophageal spasm 10/06/2013  . Substernal precordial chest pain 10/06/2013  . Chronic anxiety 10/06/2013  . GERD (gastroesophageal reflux disease) 10/06/2013  . Chronic asthma 10/06/2013  . 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 Depression with anxiety 10/06/2013  . Fibromyalgia 10/06/2013  . Hypothyroidism (acquired) 10/06/2013  . Chronic migraine 10/06/2013    Patient Centered Plan: Patient is on the following Treatment Plan(s):  Anxiety and Depression  Recommendations for Services/Supports/Treatments: Recommendations for Services/Supports/Treatments Recommendations For Services/Supports/Treatments: Individual Therapy, Medication Management  Treatment Plan  Summary: OP Treatment Plan Summary: Stefhanie will manage anxiety by identifying thoughts and challenging thoughts ffor 5 out of 7 days for 60 days.   Referrals to Alternative Service(s): Referred to Alternative Service(s):   Place:   Date:   Time:    Referred to Alternative Service(s):   Place:   Date:   Time:    Referred to Alternative Service(s):   Place:   Date:   Time:    Referred to Alternative Service(s):   Place:   Date:   Time:     Glori Bickers, LCSW

## 2019-05-16 ENCOUNTER — Other Ambulatory Visit: Payer: Self-pay | Admitting: Obstetrics & Gynecology

## 2019-05-16 DIAGNOSIS — Z1231 Encounter for screening mammogram for malignant neoplasm of breast: Secondary | ICD-10-CM

## 2019-05-16 DIAGNOSIS — Z1239 Encounter for other screening for malignant neoplasm of breast: Secondary | ICD-10-CM

## 2019-05-16 NOTE — Telephone Encounter (Signed)
Order placed.  She can now call and schedule as her schedule permits.

## 2019-05-17 NOTE — Telephone Encounter (Signed)
Pt aware.

## 2019-05-25 ENCOUNTER — Telehealth: Payer: Self-pay

## 2019-05-25 ENCOUNTER — Other Ambulatory Visit: Payer: Self-pay | Admitting: Obstetrics & Gynecology

## 2019-05-25 DIAGNOSIS — Z780 Asymptomatic menopausal state: Secondary | ICD-10-CM

## 2019-05-25 MED ORDER — ESTRADIOL 0.1 MG/24HR TD PTWK: 0.1000 mg | MEDICATED_PATCH | TRANSDERMAL | 11 refills | Status: DC

## 2019-05-25 NOTE — Telephone Encounter (Signed)
Would she like generic patch, or change to the pill?

## 2019-05-25 NOTE — Telephone Encounter (Signed)
Pt states her insurance has changed and no longer covers [place]. She is requesting [facility] send in rx for Estradiol. NL#[phone]

## 2019-05-25 NOTE — Telephone Encounter (Signed)
Pt wants to try the patch

## 2019-06-03 ENCOUNTER — Ambulatory Visit (HOSPITAL_COMMUNITY): Payer: Medicare Other | Admitting: Licensed Clinical Social Worker

## 2019-06-03 ENCOUNTER — Other Ambulatory Visit: Payer: Self-pay

## 2019-06-09 ENCOUNTER — Telehealth: Payer: Self-pay

## 2019-06-10 ENCOUNTER — Other Ambulatory Visit: Payer: Self-pay | Admitting: Adult Health

## 2019-06-10 MED ORDER — BREO ELLIPTA 100-25 MCG/INH IN AEPB: 1.0000 | INHALATION_SPRAY | Freq: Every day | RESPIRATORY_TRACT | 2 refills | Status: DC

## 2019-06-10 MED ORDER — AZELASTINE HCL 0.1 % NA SOLN: 2.0000 | Freq: Two times a day (BID) | NASAL | 4 refills | Status: DC

## 2019-06-10 NOTE — Progress Notes (Signed)
Pts insurance changed.  Sent Breo and azelastine to phamaracy to replace dimista, and dulera.

## 2019-06-10 NOTE — Telephone Encounter (Signed)
Pt was notified.  

## 2019-06-10 NOTE — Telephone Encounter (Signed)
Done.   Sent breo and azelastine.

## 2019-06-16 DIAGNOSIS — Z79899 Other long term (current) drug therapy: Secondary | ICD-10-CM | POA: Diagnosis not present

## 2019-06-16 DIAGNOSIS — M1990 Unspecified osteoarthritis, unspecified site: Secondary | ICD-10-CM | POA: Diagnosis not present

## 2019-06-20 NOTE — Progress Notes (Signed)
Virtual Visit via Video Note  I connected with Susan [name] on 06/24/19 at  8:00 AM EDT by a video enabled telemedicine application and verified that I am speaking with the correct person using two identifiers.   I discussed the limitations of evaluation and management by telemedicine and the availability of in person appointments. The patient expressed understanding and agreed to proceed.     I discussed the assessment and treatment plan with the patient. The patient was provided an opportunity to ask questions and all were answered. The patient agreed with the plan and demonstrated an understanding of the instructions.   The patient was advised to call back or seek an in-person evaluation if the symptoms worsen or if the condition fails to improve as anticipated.  I provided 15 minutes of non-face-to-face time during this encounter.   [name], MD    [facility] MD/PA/NP OP Progress Note  06/24/2019 8:33 AM Susan [name]  MRN:  [id number]  Chief Complaint:  Chief Complaint    Anxiety; Follow-up     HPI: This is a follow-up appointment for anxiety.  She states that she was anxious when she was around with people in a funeral.  She attended the funeral for a friend from church. She denies any anxiety when she does not interact with other people. She enjoys taking a walk with her dog.  She also enjoyed going to camping with her husband and a couple friends.  She has middle insomnia.  She denies feeling depressed.  She has good motivation and energy.  She has fair concentration.  She denies SI.  She mostly is able to feel relaxed when she is by herself as long as she takes Xanax.  She denies panic attacks.  She has been trying to eat healthy daily and has good appetite.  She asks her medication to be filled so that it would last while she is in [place]). Although she continues to have occasional memory loss, no change since the last visit.   Visit Diagnosis:    ICD-10-CM   1.  MDD (major depressive disorder), recurrent, in partial remission ([place])  F33.41   2. Generalized anxiety disorder  F41.1 ALPRAZolam (XANAX) 0.5 MG tablet    Past Psychiatric History: Please see initial evaluation for full details. I have reviewed the history. No updates at this time.     Past Medical History:  Past Medical History:  Diagnosis Date  . Arthritis   . Asthma   . Asthmatic bronchitis   . BRCA negative 05/2014   MyRisk neg  . Celiac disease   . Celiac sprue   . Chronic anxiety   . Complication of anesthesia    vomiting  . COPD (chronic obstructive pulmonary disease) ([place])   . Depression   . Environmental allergies   . Family history of breast cancer 05/2014   MyRisk neg; IBIS=29.5%  . Fibromyalgia    neuropathy all over  . Gastritis 10/08/2013  . GERD (gastroesophageal reflux disease)   . Hyperlipemia   . Hyperlipidemia    Intolerant to statins  . Hypothyroidism (acquired)   . Increased risk of breast cancer 05/2014   IBIS=29.5%  . Increased risk of breast cancer 05/2014   IBIS=29.5%  . Migraine   . Migraines   . 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 Pancreatitis   . Sjogren's disease ([place])   . Spastic colon   . Thyroid cancer ([place]) 1990 and 1994   Total thyroidectomy with radioactive iodine.  Past Surgical History:  Procedure Laterality Date  . ABDOMINAL HYSTERECTOMY  2006  . BLADDER REPAIR    . BREAST CYST ASPIRATION Left yrs ago  . CHOLECYSTECTOMY  1989  . COLONOSCOPY WITH ESOPHAGOGASTRODUODENOSCOPY (EGD)    . COLONOSCOPY WITH PROPOFOL N/A 08/03/2017   Procedure: COLONOSCOPY WITH PROPOFOL;  Surgeon: Lollie Sails, MD;  Location: [facility] ENDOSCOPY;  Service: Endoscopy;  Laterality: N/A;  . COLONOSCOPY WITH PROPOFOL N/A 10/18/2018   Procedure: COLONOSCOPY WITH PROPOFOL;  Surgeon: Lollie Sails, MD;  Location: [facility] ENDOSCOPY;  Service: Endoscopy;  Laterality: N/A;  . ESOPHAGOGASTRODUODENOSCOPY N/A 10/07/2013   Procedure:  ESOPHAGOGASTRODUODENOSCOPY (EGD);  Surgeon: Danie Binder, MD;  Location: [facility] ENDO SUITE;  Service: Endoscopy;  Laterality: N/A;  patient received heparin at 530am given phenergan 80m IV 30 minutes before  . EYE SURGERY     lacrimal gland   . FLEXIBLE BRONCHOSCOPY N/A 06/04/2016   Procedure: FLEXIBLE BRONCHOSCOPY;  Surgeon: SAllyne Gee MD;  Location: [facility] ORS;  Service: Pulmonary;  Laterality: N/A;  . INCONTINENCE SURGERY  2004  . NASAL SINUS SURGERY    . THYROID LOBECTOMY    . TOTAL THYROIDECTOMY      Family Psychiatric History: Please see initial evaluation for full details. I have reviewed the history. No updates at this time.     Family History:  Family History  Problem Relation Age of Onset  . Arthritis/Rheumatoid Mother        died in age 7068s . Breast cancer Mother 436 . Breast cancer Maternal Grandmother        40's  . Breast cancer Maternal Aunt        X 2. 50's  . Lung cancer Maternal Aunt   . Pancreatitis Neg Hx   . Colon cancer Neg Hx     Social History:  Social History   Socioeconomic History  . Marital status: Married    Spouse name: Not on file  . Number of children: 0  . Years of education: Not on file  . Highest education level: Not on file  Occupational History  . Not on file  Social Needs  . Financial resource strain: Not on file  . Food insecurity    Worry: Not on file    Inability: Not on file  . Transportation needs    Medical: Not on file    Non-medical: Not on file  Tobacco Use  . Smoking status: Never Smoker  . Smokeless tobacco: Never Used  Substance and Sexual Activity  . Alcohol use: No  . Drug use: No  . Sexual activity: Yes    Birth control/protection: Surgical  Lifestyle  . Physical activity    Days per week: Not on file    Minutes per session: Not on file  . Stress: Not on file  Relationships  . Social [place] phone: Not on file    Gets together: Not on file    Attends religious service: Not on file     Active member of club or organization: Not on file    Attends meetings of clubs or organizations: Not on file    Relationship status: Not on file  Other Topics Concern  . Not on file  Social History Narrative  . Not on file    Allergies:  Allergies  Allergen Reactions  . Aspirin   . Keflex [Cephalexin] Shortness Of Breath  . Azithromycin Hives and Nausea Only  . Barley Grass   .  Clarithromycin Other (See Comments)  . Metoclopramide Other (See Comments)    TREMORS Tremors Tremors   . Oat Other (See Comments)  . Oatmeal Other (See Comments)  . Other     Other reaction(s): Other (See Comments) KEOPLEX.  Marland Kitchen Rye Grass Flower Pollen Extract [Gramineae Pollens]   . Vioxx [Rofecoxib] Diarrhea  . Wheat Bran Other (See Comments)    Celiac disease  . Wheat Extract   . Celebrex [Celecoxib] Hives  . Metoclopramide Hcl Anxiety    BODY TREMORS    Metabolic Disorder Labs: No results found for: HGBA1C, MPG No results found for: PROLACTIN Lab Results  Component Value Date   CHOL 189 03/03/2019   TRIG 121 03/03/2019   HDL 53 03/03/2019   LDLCALC 112 (H) 03/03/2019   Lab Results  Component Value Date   TSH 0.621 03/03/2019   TSH 0.962 10/07/2013    Therapeutic Level Labs: No results found for: LITHIUM No results found for: VALPROATE No components found for:  CBMZ  Current Medications: Current Outpatient Medications  Medication Sig Dispense Refill  . methocarbamol (ROBAXIN) 500 MG tablet Take 500 mg by mouth 2 (two) times daily.    Marland Kitchen acetaminophen (TYLENOL) 500 MG tablet Take by mouth.    Marland Kitchen albuterol (PROVENTIL HFA;VENTOLIN HFA) 108 (90 Base) MCG/ACT inhaler Inhale 2 puffs into the lungs every 6 (six) hours as needed for wheezing or shortness of breath. 1 Inhaler 2  . albuterol (PROVENTIL) (2.5 MG/3ML) 0.083% nebulizer solution Take 3 mLs (2.5 mg total) by nebulization every 6 (six) hours as needed for wheezing or shortness of breath. 75 mL 6  . [START ON 07/14/2019]  ALPRAZolam (XANAX) 0.5 MG tablet Take 1 tablet (0.5 mg total) by mouth 2 (two) times daily as needed. FOR ANXIETY 60 tablet 2  . [START ON 08/10/2019] amitriptyline (ELAVIL) 25 MG tablet Take 1 tablet (25 mg total) by mouth at bedtime. 90 tablet 0  . azelastine (ASTELIN) 0.1 % nasal spray Place 2 sprays into both nostrils 2 (two) times daily. Use in each nostril as directed 30 mL 4  . Bisacodyl (DULCOLAX PO) Take by mouth.    . carisoprodol (SOMA) 350 MG tablet Take 350 mg by mouth at bedtime. Patient states that she takes 1/2 tablet nightly    . Dentifrices (BIOTENE DRY MOUTH DT) by Transmucosal route.    . DYMISTA 137-50 MCG/ACT SUSP Use 1 spray(s) in each nostril once daily 23 g 0  . eletriptan (RELPAX) 40 MG tablet Take 40 mg by mouth as needed for migraine. may repeat in 2 hours if necessary     . EPINEPHRINE 0.3 mg/0.3 mL IJ SOAJ injection USE AS DIRECTED 1 Device 2  . estradiol (CLIMARA - DOSED IN MG/24 HR) 0.1 mg/24hr patch Place 1 patch (0.1 mg total) onto the skin once a week. 4 patch 11  . ezetimibe (ZETIA) 10 MG tablet Take 1 tablet (10 mg total) by mouth daily. 90 tablet 1  . famotidine (PEPCID) 10 MG tablet Take 20 mg by mouth.    . fenofibrate (TRICOR) 145 MG tablet Take 1 tablet (145 mg total) by mouth daily. 90 tablet 1  . Fluticasone Furoate (ARNUITY ELLIPTA) 100 MCG/ACT AEPB Inhale 2 puffs into the lungs every morning.    . fluticasone furoate-vilanterol (BREO ELLIPTA) 100-25 MCG/INH AEPB Inhale 1 puff into the lungs daily. 60 each 2  . gabapentin (NEURONTIN) 600 MG tablet Take 0.5 tablets (300 mg total) by mouth 2 (two) times daily. 45 tablet  0  . hydroxychloroquine (PLAQUENIL) 200 MG tablet Take by mouth 2 (two) times daily.    . hyoscyamine (NULEV) 0.125 MG TBDP Place 0.125 mg under the tongue every 4 (four) hours as needed for bladder spasms.     . Lactobacillus (DIGESTIVE HEALTH PROBIOTIC PO) Take 1 tablet by mouth daily.    . Levothyroxine Sodium 137 MCG CAPS Take 1  capsule (137 mcg total) by mouth daily before breakfast. 90 capsule 1  . mometasone-formoterol (DULERA) 100-5 MCG/ACT AERO Inhale 2 puffs into the lungs 2 (two) times daily.    . montelukast (SINGULAIR) 10 MG tablet TAKE ONE TABLET BY MOUTH ONCE DAILY FOR ASTHMA 30 tablet 5  . mupirocin ointment (BACTROBAN) 2 %   0  . pantoprazole (PROTONIX) 40 MG tablet Take 40 mg by mouth daily.    Vladimir Faster Glycol-Propyl Glycol 0.4-0.3 % SOLN Apply to eye.    . Polyethylene Glycol 3350 (PEG 3350) POWD AS DIRECTED FOR COLONIC PREP.    Marland Kitchen PRESCRIPTION MEDICATION 1 Syringe by Subdermal route once a week. Allergy shot    . sodium chloride (OCEAN) 0.65 % SOLN nasal spray Place 1 spray into both nostrils as needed for congestion.    . sodium chloride 0.9 % nebulizer solution Take 3 mLs by nebulization 3 (three) times daily.    . Tuberculin-Allergy Syringes 28G X 1/2" 1 ML MISC 1 each by Does not apply route every 14 (fourteen) days. For allergy injections 12 each 3  . Vaginal Lubricant (REPLENS) GEL Place vaginally.    Derrill Memo ON 08/10/2019] venlafaxine XR (EFFEXOR-XR) 150 MG 24 hr capsule Take 2 capsules (300 mg total) by mouth at bedtime. 180 capsule 0  . Vitamin D, Ergocalciferol, (DRISDOL) 1.25 MG (50000 UT) CAPS capsule Take 1 capsule (50,000 Units total) by mouth every 7 (seven) days. Monday 12 capsule 1  . vitamin E 1000 UNIT capsule Take by mouth.     No current facility-administered medications for this visit.      Musculoskeletal: Strength & Muscle Tone: N/A Gait & Station: N/A Patient leans: N/A  Psychiatric Specialty Exam: Review of Systems  Psychiatric/Behavioral: Positive for memory loss. Negative for depression, hallucinations, substance abuse and suicidal ideas. The patient is nervous/anxious and has insomnia.   All other systems reviewed and are negative.   There were no vitals taken for this visit.There is no height or weight on file to calculate BMI.  General Appearance: Fairly  Groomed  Eye Contact:  Good  Speech:  Clear and Coherent  Volume:  Normal  Mood:  Anxious  Affect:  Appropriate, Congruent and euthymic  Thought Process:  Coherent  Orientation:  Full (Time, Place, and Person)  Thought Content: Logical   Suicidal Thoughts:  No  Homicidal Thoughts:  No  Memory:  Immediate;   Good  Judgement:  Good  Insight:  Fair  Psychomotor Activity:  Normal  Concentration:  Concentration: Good and Attention Span: Good  Recall:  Good  Fund of Knowledge: Good  Language: Good  Akathisia:  No  Handed:  Right  AIMS (if indicated): not done  Assets:  Communication Skills Desire for Improvement  ADL's:  Intact  Cognition: WNL  Sleep:  Fair   Screenings: Mini-Mental     Clinical Support from 03/22/2019 in [facility], [facility]  Total Score (max 30 points )  30    PHQ2-9     Clinical Support from 03/22/2019 in [facility], [facility] Office Visit from 02/07/2019 in [facility],  [facility] Office Visit from 09/07/2018 in [facility], [facility] Office Visit from 12/08/2017 in [facility], [facility]  PHQ-2 Total Score  0  0  0  0       Assessment and Plan:  Susan [name] is a 63 y.o. year old female with a history of anxiety, bilateral osteoarthritis, COPD, s/p thyroidectomy, hypothyroidism, history ofCeliac Disease, GERD, Sjogren's syndrome , who presents for follow up appointment for MDD (major depressive disorder), recurrent, in partial remission ([place])  Generalized anxiety disorder - Plan: ALPRAZolam (XANAX) 0.5 MG tablet  # GAD # Social anxiety  # MDD, recurrent in partial remission Exam is notable for euthymic affect and patient is well engaged in the interview.  She reports occasional social anxiety, it has been relatively well managed since her last visit.  Psychosocial stressors includes loss of her parents a few years ago, and medical condition includes pain.  Will continue venlafaxine to target anxiety and depression.   Will continue amitriptyline for depression, anxiety and pain.  Will continue Xanax as needed for anxiety; discussed risk of dependence and oversedation.  Will not plan to uptitrate the dose at this time to avoid any potential side effect. Discussed behavioral activation. She has an upcoming appointment with a therapist.   # Memory loss She complains of short-term memory loss since she was admitted to psychiatry unit for depression in 1999. IADL independent. Will consider MOCA as indicated in the future.    Plan I have reviewed and updated plans as below 1. Continue venlafaxine 300 mg daily  2. Continue amitriptyline25 mg at night (anticholinergic side effect from higher dose) 3. Continue Xanax 0.5 mg twice a day as needed for anxiety (will allow her to have sooner refill given she will go to [place] from [place]).  4. Return to clinic in December for 20 mins 5. Referred to therapy - oncarisoprodol (Soma), gabapentin 300 mg BID  Past trials of medication:sertraline, Paxil, venlafaxine, Xanax, Ritalin (worked very well)   The patient demonstrates the following risk factors for suicide: Chronic risk factors for suicide include:psychiatric disorder ofanxiety. Acute risk factorsfor suicide include: N/A. Protective factorsfor this patient include: positive social support, responsibility to others (children, family), coping skills and hope for the future. Considering these factors, the overall suicide risk at this point appears to below. She owns a gun. Patientisappropriate for outpatient follow up.  [name], MD 06/24/2019, 8:33 AM

## 2019-06-21 ENCOUNTER — Other Ambulatory Visit: Payer: Self-pay

## 2019-06-21 ENCOUNTER — Encounter (HOSPITAL_COMMUNITY): Payer: Self-pay | Admitting: Licensed Clinical Social Worker

## 2019-06-21 ENCOUNTER — Ambulatory Visit (INDEPENDENT_AMBULATORY_CARE_PROVIDER_SITE_OTHER): Payer: Medicare Other | Admitting: Licensed Clinical Social Worker

## 2019-06-21 DIAGNOSIS — F411 Generalized anxiety disorder: Secondary | ICD-10-CM

## 2019-06-21 DIAGNOSIS — R69 Illness, unspecified: Secondary | ICD-10-CM | POA: Diagnosis not present

## 2019-06-21 NOTE — Progress Notes (Signed)
Virtual Visit via Video Note  I connected with Susan [name] on 06/21/19 at  9:00 AM EDT by a video enabled telemedicine application and verified that I am speaking with the correct person using two identifiers.  Location: Patient: Home Provider: Office   I discussed the limitations of evaluation and management by telemedicine and the availability of in person appointments. The patient expressed understanding and agreed to proceed.   THERAPIST PROGRESS NOTE  Session Time: 9:00 am-9:20 am  Participation Level: Active  Behavioral Response: CasualAlertAnxious  Type of Therapy: Individual Therapy  Treatment Goals addressed: Anxiety  Interventions: CBT and Solution Focused  Summary: Susan [name] is a 63 y.o. female who presents oriented x5 (person, place, situation, time, and object), casually dressed, appropriately groomed, average height, overweight, and cooperative to address anxiety. Patient has a history of medical treatment including fibromyalgia and asthma. Patient has a history of mental health treatment including medication management and outpatient therapy. Patient denies suicidal and homicidal ideations. Patient denies psychosis including auditory and visual hallucinations. Patient denies substance abuse. She is at low risk for lethality.  Physically: Patient has experienced an increase in fatigue. She has been sleeping more during the day. Patient changed her sleep medication recently but her sleep disturbance occurred before the change.  Spiritually/Values: Patient continues to watch church services online.  Relationships: Patient is getting along with others.  Emotionally/Mentally/Behavior: Patient denies depression and anxiety. Patient's mood is stable. She has been staying active (walking her dog), cleaning her home, and eating regularly.   Patient engaged in session. She responded well to interventions. Patient continues to meet criteria for Generalized Anxiety  Disorder. Patient will continue in outpatient therapy due to being the least restrictive service to meet her needs at this time. Patient made minimal progress on her goals.   Suicidal/Homicidal: Nowithout intent/plan  Therapist Response: Therapist reviewed patient's recent thoughts and behaviors. Therapist utilized CBT to address anxiety. Therapist processed patient's thoughts to identify triggers for anxiety. Therapist assisted patient in identifying what behaviors she has engaged in to improve anxiety.   Plan: Return again in 4-6 weeks.  Diagnosis: Axis I: Generalized Anxiety Disorder    Axis II: No diagnosis  I discussed the assessment and treatment plan with the patient. The patient was provided an opportunity to ask questions and all were answered. The patient agreed with the plan and demonstrated an understanding of the instructions.   The patient was advised to call back or seek an in-person evaluation if the symptoms worsen or if the condition fails to improve as anticipated.  I provided 20 minutes of non-face-to-face time during this encounter.    Glori Bickers, LCSW 06/21/2019

## 2019-06-22 ENCOUNTER — Encounter: Payer: Self-pay | Admitting: Nurse Practitioner

## 2019-06-24 ENCOUNTER — Other Ambulatory Visit: Payer: Self-pay

## 2019-06-24 ENCOUNTER — Ambulatory Visit (INDEPENDENT_AMBULATORY_CARE_PROVIDER_SITE_OTHER): Payer: Medicare Other | Admitting: Psychiatry

## 2019-06-24 ENCOUNTER — Encounter (HOSPITAL_COMMUNITY): Payer: Self-pay | Admitting: Psychiatry

## 2019-06-24 DIAGNOSIS — F3341 Major depressive disorder, recurrent, in partial remission: Secondary | ICD-10-CM | POA: Diagnosis not present

## 2019-06-24 DIAGNOSIS — F411 Generalized anxiety disorder: Secondary | ICD-10-CM | POA: Diagnosis not present

## 2019-06-24 DIAGNOSIS — R69 Illness, unspecified: Secondary | ICD-10-CM | POA: Diagnosis not present

## 2019-06-24 MED ORDER — FLUTICASONE PROPIONATE 50 MCG/ACT NA SUSP: 2.0000 | Freq: Every day | NASAL | 3 refills | Status: DC

## 2019-06-24 MED ORDER — ALPRAZOLAM 0.5 MG PO TABS: 0.5000 mg | ORAL_TABLET | Freq: Two times a day (BID) | ORAL | 2 refills | Status: DC | PRN

## 2019-06-24 MED ORDER — VENLAFAXINE HCL ER 150 MG PO CP24: 300.0000 mg | ORAL_CAPSULE | Freq: Every day | ORAL | 0 refills | Status: DC

## 2019-06-24 MED ORDER — AMITRIPTYLINE HCL 25 MG PO TABS: 25.0000 mg | ORAL_TABLET | Freq: Every day | ORAL | 0 refills | Status: DC

## 2019-06-24 NOTE — Patient Instructions (Signed)
1. Continue venlafaxine 300 mg daily  2. Continue amitriptyline25 mg at night  3. Continue Xanax 0.5 mg twice a day as needed for anxiety  4. Return to clinic in December

## 2019-07-01 ENCOUNTER — Other Ambulatory Visit: Payer: Self-pay | Admitting: Adult Health

## 2019-07-01 DIAGNOSIS — M797 Fibromyalgia: Secondary | ICD-10-CM

## 2019-07-01 NOTE — Telephone Encounter (Signed)
CAN YOU PLEASE SEND I AM UNABLE TO SEND

## 2019-07-06 ENCOUNTER — Ambulatory Visit (HOSPITAL_COMMUNITY): Payer: Medicare Other | Admitting: Psychiatry

## 2019-07-14 ENCOUNTER — Ambulatory Visit (INDEPENDENT_AMBULATORY_CARE_PROVIDER_SITE_OTHER): Payer: Medicare Other | Admitting: Nurse Practitioner

## 2019-07-14 ENCOUNTER — Other Ambulatory Visit: Payer: Self-pay

## 2019-07-14 ENCOUNTER — Encounter: Payer: Self-pay | Admitting: Nurse Practitioner

## 2019-07-14 VITALS — BP 115/75 | HR 73 | Temp 98.0°F | Resp 16 | Ht 65.0 in | Wt 189.0 lb

## 2019-07-14 DIAGNOSIS — J3089 Other allergic rhinitis: Secondary | ICD-10-CM | POA: Diagnosis not present

## 2019-07-14 DIAGNOSIS — R69 Illness, unspecified: Secondary | ICD-10-CM | POA: Diagnosis not present

## 2019-07-14 DIAGNOSIS — E039 Hypothyroidism, unspecified: Secondary | ICD-10-CM | POA: Diagnosis not present

## 2019-07-14 DIAGNOSIS — F411 Generalized anxiety disorder: Secondary | ICD-10-CM

## 2019-07-14 MED ORDER — LEVOTHYROXINE SODIUM 137 MCG PO CAPS: 137.0000 ug | ORAL_CAPSULE | Freq: Every day | ORAL | 1 refills | Status: DC

## 2019-07-14 MED ORDER — MONTELUKAST SODIUM 10 MG PO TABS: ORAL_TABLET | ORAL | 3 refills | Status: DC

## 2019-07-14 NOTE — Progress Notes (Signed)
[facility] [place], [place] [address]  Internal MEDICINE  Office Visit Note  Patient Name: Susan [name]  [id number]  [id number]  Date of Service: 07/24/2019  Chief Complaint  Patient presents with  . Hypothyroidism  . Hyperlipidemia  . Gastroesophageal Reflux    The patient is here for routine follow up visit. She states that she has had some allergy issues over the past two days. Has nasal congestion and scratchy throat. Feels like lymph nodes on left side of the neck are a little swollen. Was outside for the past few days and has increased nasal congestion. Wheezing and asthma are well managed.   She has established care with new psychiatric provider. They have taken over prescribing all medications for mental health, including for her alprazolam .      Current Medication: Outpatient Encounter Medications as of 07/14/2019  Medication Sig  . acetaminophen (TYLENOL) 500 MG tablet Take by mouth.  Marland Kitchen albuterol (PROVENTIL HFA;VENTOLIN HFA) 108 (90 Base) MCG/ACT inhaler Inhale 2 puffs into the lungs every 6 (six) hours as needed for wheezing or shortness of breath.  Marland Kitchen albuterol (PROVENTIL) (2.5 MG/3ML) 0.083% nebulizer solution Take 3 mLs (2.5 mg total) by nebulization every 6 (six) hours as needed for wheezing or shortness of breath.  . ALPRAZolam (XANAX) 0.5 MG tablet Take 1 tablet (0.5 mg total) by mouth 2 (two) times daily as needed. FOR ANXIETY  . [START ON 08/10/2019] amitriptyline (ELAVIL) 25 MG tablet Take 1 tablet (25 mg total) by mouth at bedtime.  . carisoprodol (SOMA) 350 MG tablet Take 350 mg by mouth at bedtime. Patient states that she takes 1/2 tablet nightly  . Dentifrices (BIOTENE DRY MOUTH DT) by Transmucosal route.  . eletriptan (RELPAX) 40 MG tablet Take 40 mg by mouth as needed for migraine. may repeat in 2 hours if necessary   . EPINEPHRINE 0.3 mg/0.3 mL IJ SOAJ injection USE AS DIRECTED  . estradiol (CLIMARA - DOSED IN MG/24 HR) 0.1  mg/24hr patch Place 1 patch (0.1 mg total) onto the skin once a week.  . ezetimibe (ZETIA) 10 MG tablet Take 1 tablet (10 mg total) by mouth daily.  . famotidine (PEPCID) 10 MG tablet Take 20 mg by mouth.  . fenofibrate (TRICOR) 145 MG tablet Take 1 tablet (145 mg total) by mouth daily.  . fluticasone (FLONASE) 50 MCG/ACT nasal spray Place 2 sprays into both nostrils daily.  . fluticasone furoate-vilanterol (BREO ELLIPTA) 100-25 MCG/INH AEPB Inhale 1 puff into the lungs daily.  Marland Kitchen gabapentin (NEURONTIN) 600 MG tablet Take 1/2 (one-half) tablet by mouth twice daily  . hydroxychloroquine (PLAQUENIL) 200 MG tablet Take by mouth 2 (two) times daily.  . hyoscyamine (NULEV) 0.125 MG TBDP Place 0.125 mg under the tongue every 4 (four) hours as needed for bladder spasms.   . Lactobacillus (DIGESTIVE HEALTH PROBIOTIC PO) Take 1 tablet by mouth daily.  . Levothyroxine Sodium 137 MCG CAPS Take 1 capsule (137 mcg total) by mouth daily before breakfast.  . montelukast (SINGULAIR) 10 MG tablet TAKE ONE TABLET BY MOUTH ONCE DAILY FOR ASTHMA  . mupirocin ointment (BACTROBAN) 2 %   . pantoprazole (PROTONIX) 40 MG tablet Take 40 mg by mouth daily.  Vladimir Faster Glycol-Propyl Glycol 0.4-0.3 % SOLN Apply to eye.  Marland Kitchen PRESCRIPTION MEDICATION 1 Syringe by Subdermal route once a week. Allergy shot  . sodium chloride (OCEAN) 0.65 % SOLN nasal spray Place 1 spray into both nostrils as needed for congestion.  Marland Kitchen  Tuberculin-Allergy Syringes 28G X 1/2" 1 ML MISC 1 each by Does not apply route every 14 (fourteen) days. For allergy injections  . Vaginal Lubricant (REPLENS) GEL Place vaginally.  Derrill Memo ON 08/10/2019] venlafaxine XR (EFFEXOR-XR) 150 MG 24 hr capsule Take 2 capsules (300 mg total) by mouth at bedtime.  . Vitamin D, Ergocalciferol, (DRISDOL) 1.25 MG (50000 UT) CAPS capsule Take 1 capsule (50,000 Units total) by mouth every 7 (seven) days. Monday  . vitamin E 1000 UNIT capsule Take by mouth.  . [DISCONTINUED]  Levothyroxine Sodium 137 MCG CAPS Take 1 capsule (137 mcg total) by mouth daily before breakfast.  . [DISCONTINUED] montelukast (SINGULAIR) 10 MG tablet TAKE ONE TABLET BY MOUTH ONCE DAILY FOR ASTHMA  . [DISCONTINUED] Bisacodyl (DULCOLAX PO) Take by mouth.  . [DISCONTINUED] DYMISTA 137-50 MCG/ACT SUSP Use 1 spray(s) in each nostril once daily (Patient not taking: Reported on 06/24/2019)  . [DISCONTINUED] Fluticasone Furoate (ARNUITY ELLIPTA) 100 MCG/ACT AEPB Inhale 2 puffs into the lungs every morning.  . [DISCONTINUED] methocarbamol (ROBAXIN) 500 MG tablet Take 500 mg by mouth 2 (two) times daily.  . [DISCONTINUED] mometasone-formoterol (DULERA) 100-5 MCG/ACT AERO Inhale 2 puffs into the lungs 2 (two) times daily.  . [DISCONTINUED] Polyethylene Glycol 3350 (PEG 3350) POWD AS DIRECTED FOR COLONIC PREP.  . [DISCONTINUED] sodium chloride 0.9 % nebulizer solution Take 3 mLs by nebulization 3 (three) times daily.   No facility-administered encounter medications on file as of 07/14/2019.     Surgical History: Past Surgical History:  Procedure Laterality Date  . ABDOMINAL HYSTERECTOMY  2006  . BLADDER REPAIR    . BREAST CYST ASPIRATION Left yrs ago  . CHOLECYSTECTOMY  1989  . COLONOSCOPY WITH ESOPHAGOGASTRODUODENOSCOPY (EGD)    . COLONOSCOPY WITH PROPOFOL N/A 08/03/2017   Procedure: COLONOSCOPY WITH PROPOFOL;  Surgeon: Lollie Sails, MD;  [facility] ENDOSCOPY;  Service: Endoscopy;  Laterality: N/A;  . COLONOSCOPY WITH PROPOFOL N/A 10/18/2018   Procedure: COLONOSCOPY WITH PROPOFOL;  Surgeon: Lollie Sails, MD;  Location: [facility] ENDOSCOPY;  Service: Endoscopy;  Laterality: N/A;  . ESOPHAGOGASTRODUODENOSCOPY N/A 10/07/2013   Procedure: ESOPHAGOGASTRODUODENOSCOPY (EGD);  Surgeon: Danie Binder, MD;  Location: [facility] ENDO SUITE;  Service: Endoscopy;  Laterality: N/A;  patient received heparin at 530am given phenergan 31m IV 30 minutes before  . EYE SURGERY     lacrimal gland   . FLEXIBLE  BRONCHOSCOPY N/A 06/04/2016   Procedure: FLEXIBLE BRONCHOSCOPY;  Surgeon: SAllyne Gee MD;  Location: [facility] ORS;  Service: Pulmonary;  Laterality: N/A;  . INCONTINENCE SURGERY  2004  . NASAL SINUS SURGERY    . THYROID LOBECTOMY    . TOTAL THYROIDECTOMY      Medical History: Past Medical History:  Diagnosis Date  . Arthritis   . Asthma   . Asthmatic bronchitis   . BRCA negative 05/2014   MyRisk neg  . Celiac disease   . Celiac sprue   . Chronic anxiety   . Complication of anesthesia    vomiting  . COPD [place] obstructive [place] disease) [place]   . Depression   . Environmental allergies   . Family history of breast cancer 05/2014   MyRisk neg; IBIS=29.5%  . Fibromyalgia    neuropathy all over  . Gastritis 10/08/2013  . GERD (gastroesophageal reflux disease)   . Hyperlipemia   . Hyperlipidemia    Intolerant to statins  . Hypothyroidism (acquired)   . Increased risk of breast cancer 05/2014   IBIS=29.5%  . Increased risk  of breast cancer 05/2014   IBIS=29.5%  . Migraine   . Migraines   . 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 Pancreatitis   . Sjogren's disease ([place])   . Spastic colon   . Thyroid cancer ([place]) 1990 and 1994   Total thyroidectomy with radioactive iodine.     Family History: Family History  Problem Relation Age of Onset  . Arthritis/Rheumatoid Mother        died in age 9s  . Breast cancer Mother 8  . Breast cancer Maternal Grandmother        40's  . Breast cancer Maternal Aunt        X 2. 50's  . Lung cancer Maternal Aunt   . Pancreatitis Neg Hx   . Colon cancer Neg Hx     Social History   Socioeconomic History  . Marital status: Married    Spouse name: Not on file  . Number of children: 0  . Years of education: Not on file  . Highest education level: Not on file  Occupational History  . Not on file  Social Needs  . Financial resource strain: Not on file  . Food insecurity    Worry: Not on file    Inability: Not on file  .  Transportation needs    Medical: Not on file    Non-medical: Not on file  Tobacco Use  . Smoking status: Never Smoker  . Smokeless tobacco: Never Used  Substance and Sexual Activity  . Alcohol use: No  . Drug use: No  . Sexual activity: Yes    Birth control/protection: Surgical  Lifestyle  . Physical activity    Days per week: Not on file    Minutes per session: Not on file  . Stress: Not on file  Relationships  . Social Herbalist on phone: Not on file    Gets together: Not on file    Attends religious service: Not on file    Active member of club or organization: Not on file    Attends meetings of clubs or organizations: Not on file    Relationship status: Not on file  . Intimate partner violence    Fear of current or ex partner: Not on file    Emotionally abused: Not on file    Physically abused: Not on file    Forced sexual activity: Not on file  Other Topics Concern  . Not on file  Social History Narrative  . Not on file      Review of Systems  Constitutional: Negative for chills, fatigue and fever.  HENT: Positive for congestion, ear pain, postnasal drip and rhinorrhea. Negative for sinus pain, sore throat and voice change.        Scratchy throat. Increased allergies.   Respiratory: Negative for cough and wheezing.   Cardiovascular: Negative for chest pain and palpitations.  Gastrointestinal: Negative for constipation, diarrhea, nausea and vomiting.  Endocrine: Negative for cold intolerance, heat intolerance, polydipsia and polyuria.  Musculoskeletal: Positive for arthralgias.  Allergic/Immunologic: Positive for environmental allergies.  Neurological: Positive for headaches.  Hematological: Negative for adenopathy.  Psychiatric/Behavioral: Positive for dysphoric mood. The patient is nervous/anxious.        The patient has established new psychiatric provider.     Today's Vitals   07/14/19 1404  BP: 115/75  Pulse: 73  Resp: 16  Temp: 98 F  (36.7 C)  SpO2: 97%  Weight: 189 lb (85.7 kg)  Height: 5' 5"  (1.651 m)  Body mass index is 31.45 kg/m.  Physical Exam Vitals signs and nursing note reviewed.  Constitutional:      General: She is not in acute distress.    Appearance: She is well-developed. She is not diaphoretic.  HENT:     Head: Normocephalic and atraumatic.     Right Ear: Tympanic membrane and external ear normal.     Left Ear: Tympanic membrane and external ear normal.     Nose: Congestion present.     Mouth/Throat:     Pharynx: No oropharyngeal exudate.  Eyes:     Pupils: Pupils are equal, round, and reactive to light.  Neck:     Musculoskeletal: Normal range of motion and neck supple.     Thyroid: No thyromegaly.     Vascular: No JVD.     Trachea: No tracheal deviation.  Cardiovascular:     Rate and Rhythm: Normal rate and regular rhythm.     Heart sounds: Normal heart sounds. No murmur. No friction rub. No gallop.   Pulmonary:     Effort: Pulmonary effort is normal. No respiratory distress.     Breath sounds: Normal breath sounds. No wheezing or rales.  Chest:     Chest wall: No tenderness.  Abdominal:     Palpations: Abdomen is soft.     Tenderness: There is no abdominal tenderness. There is no guarding.  Musculoskeletal: Normal range of motion.  Lymphadenopathy:     Cervical: No cervical adenopathy.  Skin:    General: Skin is warm and dry.  Neurological:     Mental Status: She is alert and oriented to person, place, and time.     Cranial Nerves: No cranial nerve deficit.  Psychiatric:        Behavior: Behavior normal.        Thought Content: Thought content normal.        Judgment: Judgment normal.    Assessment/Plan: 1. Hypothyroidism, unspecified type Stable. Continue to take levothyroxine as prescribed.  - Levothyroxine Sodium 137 MCG CAPS; Take 1 capsule (137 mcg total) by mouth daily before breakfast.  Dispense: 90 capsule; Refill: 1  2. Non-seasonal allergic rhinitis,  unspecified trigger Advised she take singulair 63m daily as well as OTC allergy medication every day. Contact office if symptoms worsen over next three to five days or if fever develops.  - montelukast (SINGULAIR) 10 MG tablet; TAKE ONE TABLET BY MOUTH ONCE DAILY FOR ASTHMA  Dispense: 90 tablet; Refill: 3  3. Generalized anxiety disorder Continue to follow up with psychiatry as scheduled.   General Counseling: Kkerry-anne mezounderstanding of the findings of todays visit and agrees with plan of treatment. I have discussed any further diagnostic evaluation that may be needed or ordered today. We also reviewed her medications today. she has been encouraged to call the office with any questions or concerns that should arise related to todays visit.  This patient was seen by [facility] Dr FLavera Guiseas a part of collaborative care agreement  Meds ordered this encounter  Medications  . montelukast (SINGULAIR) 10 MG tablet    Sig: TAKE ONE TABLET BY MOUTH ONCE DAILY FOR ASTHMA    Dispense:  90 tablet    Refill:  3    Please consider 90 day supplies to promote better adherence    Order Specific Question:   Supervising Provider    Answer:   KLavera Guise[[[id number] . Levothyroxine Sodium 137 MCG CAPS  Sig: Take 1 capsule (137 mcg total) by mouth daily before breakfast.    Dispense:  90 capsule    Refill:  1    Order Specific Question:   Supervising Provider    Answer:   Lavera Guise [[id number]]    Time spent: 20 Minutes      Dr Lavera Guise Internal medicine

## 2019-07-19 ENCOUNTER — Ambulatory Visit (INDEPENDENT_AMBULATORY_CARE_PROVIDER_SITE_OTHER): Payer: Medicare Other | Admitting: Licensed Clinical Social Worker

## 2019-07-19 ENCOUNTER — Encounter (HOSPITAL_COMMUNITY): Payer: Self-pay | Admitting: Licensed Clinical Social Worker

## 2019-07-19 ENCOUNTER — Other Ambulatory Visit: Payer: Self-pay

## 2019-07-19 DIAGNOSIS — F411 Generalized anxiety disorder: Secondary | ICD-10-CM

## 2019-07-19 DIAGNOSIS — R69 Illness, unspecified: Secondary | ICD-10-CM | POA: Diagnosis not present

## 2019-07-19 NOTE — Progress Notes (Signed)
Virtual Visit via Video Note  I connected with Susan [name] on 07/19/19 at 10:00 AM EDT by a video enabled telemedicine application and verified that I am speaking with the correct person using two identifiers.  Location: Patient: Home Provider: Office   I discussed the limitations of evaluation and management by telemedicine and the availability of in person appointments. The patient expressed understanding and agreed to proceed.   THERAPIST PROGRESS NOTE  Session Time: 10:00 am-10:40 am  Participation Level: Active  Behavioral Response: CasualAlertAnxious  Type of Therapy: Individual Therapy  Treatment Goals addressed: Anxiety  Interventions: CBT and Solution Focused  Summary: Susan [name] is a 63 y.o. female who presents oriented x5 (person, place, situation, time, and object), casually dressed, appropriately groomed, average height, overweight, and cooperative to address anxiety. Patient has a history of medical treatment including fibromyalgia and asthma. Patient has a history of mental health treatment including medication management and outpatient therapy. Patient denies suicidal and homicidal ideations. Patient denies psychosis including auditory and visual hallucinations. Patient denies substance abuse. She is at low risk for lethality.  Physically: Patient continues to have health issues and pain. She notes disrupted sleep. Patient understood that she can make behavioral changes to her improve her sleep such as when she wakes up in the middle of the night avoid the temptation to eat or watch tv due to these keeping her away. She understood that she is reinforcing bad sleep patterns through this as well as going to bed at midnight. She continues to be active with doing work around the home. She has gained 15 lbs over the last several months. She understood that sleep can impact her weight.  Spiritually/Values: Patient continues to be faithful.   Relationships: Patient is  getting along with others.  Emotionally/Mentally/Behavior: Patient denies depression and anxiety. Patient's mood is stable. Patient agreed to make changes to her sleep pattern. She is planning on leaving to [place] for several months in October but is not sure if they will leave at the beginning of the month.   Patient engaged in session. She responded well to interventions. Patient continues to meet criteria for Generalized Anxiety Disorder. Patient will continue in outpatient therapy due to being the least restrictive service to meet her needs at this time. Patient made minimal progress on her goals.   Suicidal/Homicidal: Nowithout intent/plan  Therapist Response: Therapist reviewed patient's recent thoughts and behaviors. Therapist utilized CBT to address anxiety. Therapist processed patient's thoughts to identify triggers for anxiety. Therapist assisted patient in identifying behavioral changes to improve sleep.   Plan: Return again in 4-6 weeks.  Diagnosis: Axis I: Generalized Anxiety Disorder    Axis II: No diagnosis  I discussed the assessment and treatment plan with the patient. The patient was provided an opportunity to ask questions and all were answered. The patient agreed with the plan and demonstrated an understanding of the instructions.   The patient was advised to call back or seek an in-person evaluation if the symptoms worsen or if the condition fails to improve as anticipated.  I provided 40 minutes of non-face-to-face time during this encounter.    Glori Bickers, LCSW 07/19/2019

## 2019-07-25 ENCOUNTER — Other Ambulatory Visit: Payer: Self-pay | Admitting: Adult Health

## 2019-07-25 DIAGNOSIS — E785 Hyperlipidemia, unspecified: Secondary | ICD-10-CM

## 2019-07-26 ENCOUNTER — Ambulatory Visit: Payer: Medicare Other | Admitting: Nurse Practitioner

## 2019-08-12 ENCOUNTER — Telehealth: Payer: Self-pay

## 2019-08-12 DIAGNOSIS — Z780 Asymptomatic menopausal state: Secondary | ICD-10-CM

## 2019-08-12 MED ORDER — ESTRADIOL 0.1 MG/24HR TD PTWK: 0.1000 mg | MEDICATED_PATCH | TRANSDERMAL | 9 refills | Status: DC

## 2019-08-12 NOTE — Telephone Encounter (Signed)
Pt calling to let us know her pharmacy is changing to [facility] [address], [place], [place]/t [place].  [phone]  Pt aware pharm changed in chart.  Sent in current rx of estradiol as pt has 9 refills left so they would have it on file.

## 2019-09-05 ENCOUNTER — Ambulatory Visit: Payer: Medicare Other | Admitting: Internal Medicine

## 2019-09-15 ENCOUNTER — Other Ambulatory Visit: Payer: Self-pay | Admitting: Nurse Practitioner

## 2019-09-15 DIAGNOSIS — E559 Vitamin D deficiency, unspecified: Secondary | ICD-10-CM

## 2019-09-15 MED ORDER — VITAMIN D (ERGOCALCIFEROL) 1.25 MG (50000 UNIT) PO CAPS: 50000.0000 [IU] | ORAL_CAPSULE | ORAL | 0 refills | Status: DC

## 2019-09-18 ENCOUNTER — Other Ambulatory Visit: Payer: Self-pay | Admitting: Adult Health

## 2019-09-18 DIAGNOSIS — E559 Vitamin D deficiency, unspecified: Secondary | ICD-10-CM

## 2019-10-04 ENCOUNTER — Ambulatory Visit
Admission: RE | Admit: 2019-10-04 | Discharge: 2019-10-04 | Disposition: A | Discharge status: Home / Self Care | Payer: Medicare HMO | Source: Ambulatory Visit | Attending: Obstetrics & Gynecology | Admitting: Obstetrics & Gynecology

## 2019-10-04 DIAGNOSIS — D369 Benign neoplasm, unspecified site: Secondary | ICD-10-CM | POA: Diagnosis not present

## 2019-10-04 DIAGNOSIS — Z1231 Encounter for screening mammogram for malignant neoplasm of breast: Secondary | ICD-10-CM | POA: Insufficient documentation

## 2019-10-04 DIAGNOSIS — K2100 Gastro-esophageal reflux disease with esophagitis, without bleeding: Secondary | ICD-10-CM | POA: Diagnosis not present

## 2019-10-04 DIAGNOSIS — K581 Irritable bowel syndrome with constipation: Secondary | ICD-10-CM | POA: Diagnosis not present

## 2019-10-05 ENCOUNTER — Ambulatory Visit (INDEPENDENT_AMBULATORY_CARE_PROVIDER_SITE_OTHER): Payer: Medicare HMO | Admitting: Psychiatry

## 2019-10-05 ENCOUNTER — Encounter: Payer: Self-pay | Admitting: Psychiatry

## 2019-10-05 ENCOUNTER — Other Ambulatory Visit: Payer: Self-pay

## 2019-10-05 DIAGNOSIS — F3341 Major depressive disorder, recurrent, in partial remission: Secondary | ICD-10-CM | POA: Diagnosis not present

## 2019-10-05 DIAGNOSIS — R69 Illness, unspecified: Secondary | ICD-10-CM | POA: Diagnosis not present

## 2019-10-05 DIAGNOSIS — F411 Generalized anxiety disorder: Secondary | ICD-10-CM | POA: Diagnosis not present

## 2019-10-05 MED ORDER — VENLAFAXINE HCL ER 150 MG PO CP24: 300.0000 mg | ORAL_CAPSULE | Freq: Every day | ORAL | 1 refills | Status: DC

## 2019-10-05 MED ORDER — AMITRIPTYLINE HCL 25 MG PO TABS: 25.0000 mg | ORAL_TABLET | Freq: Every day | ORAL | 1 refills | Status: DC

## 2019-10-05 MED ORDER — ALPRAZOLAM 0.5 MG PO TABS: 0.5000 mg | ORAL_TABLET | Freq: Three times a day (TID) | ORAL | 0 refills | Status: DC | PRN

## 2019-10-05 NOTE — Progress Notes (Signed)
[facility] MD OP Progress Note  I connected with  Susan [name] on 10/05/19 by a video enabled telemedicine application and verified that I am speaking with the correct person using two identifiers.   I discussed the limitations of evaluation and management by telemedicine. The patient expressed understanding and agreed to proceed.   10/05/2019 11:26 AM Susan [name]  MRN:  [id number]  Chief Complaint:  " I feel anxious all the time."  HPI: Pt stated that she was in [place] for about 2 months and had a good time there. However, she continues to have anxiety almost all the time.  She feels on the edge and irritable.  She stated she cannot have fun with a group of people.  Patient insisted that she was on Xanax 0.5 mg 3 times a day and that is what helped her a lot.  She informed that now she takes half a Soma and sometimes an extra Xanax along with it because  otherwise she cannot sleep well at night. Patient kept requesting for increasing her dose of Xanax and after reviewing her chart decision to increase Xanax to 3 times a day was made. Patient then insisted that she be given 4 months prescriptions as she is going to [place] again in January and will be back in April.  She also requested that she is given hard copies of Xanax for the remaining 3 months as she will be out of Xanax in [place].   Visit Diagnosis:    ICD-10-CM   1. MDD (major depressive disorder), recurrent, in partial remission ([place])  F33.41   2. Generalized anxiety disorder  F41.1     Past Psychiatric History: Anxiety, depression  Past Medical History:  Past Medical History:  Diagnosis Date  . Arthritis   . Asthma   . Asthmatic bronchitis   . BRCA negative 05/2014   MyRisk neg  . Celiac disease   . Celiac sprue   . Chronic anxiety   . Complication of anesthesia    vomiting  . COPD (chronic obstructive pulmonary disease) ([place])   . Depression   . Environmental allergies   . Family history of breast cancer 05/2014   MyRisk neg; IBIS=29.5%  . Fibromyalgia    neuropathy all over  . Gastritis 10/08/2013  . GERD (gastroesophageal reflux disease)   . Hyperlipemia   . Hyperlipidemia    Intolerant to statins  . Hypothyroidism (acquired)   . Increased risk of breast cancer 05/2014   IBIS=29.5%  . Increased risk of breast cancer 05/2014   IBIS=29.5%  . Migraine   . Migraines   . 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 Pancreatitis   . Sjogren's disease ([place])   . Spastic colon   . Thyroid cancer ([place]) 1990 and 1994   Total thyroidectomy with radioactive iodine.     Past Surgical History:  Procedure Laterality Date  . ABDOMINAL HYSTERECTOMY  2006  . BLADDER REPAIR    . BREAST CYST ASPIRATION Left yrs ago  . CHOLECYSTECTOMY  1989  . COLONOSCOPY WITH ESOPHAGOGASTRODUODENOSCOPY (EGD)    . COLONOSCOPY WITH PROPOFOL N/A 08/03/2017   Procedure: COLONOSCOPY WITH PROPOFOL;  Surgeon: Lollie Sails, MD;  Location: [facility] ENDOSCOPY;  Service: Endoscopy;  Laterality: N/A;  . COLONOSCOPY WITH PROPOFOL N/A 10/18/2018   Procedure: COLONOSCOPY WITH PROPOFOL;  Surgeon: Lollie Sails, MD;  Location: [facility] ENDOSCOPY;  Service: Endoscopy;  Laterality: N/A;  . ESOPHAGOGASTRODUODENOSCOPY N/A 10/07/2013   Procedure: ESOPHAGOGASTRODUODENOSCOPY (EGD);  Surgeon: Danie Binder, MD;  Location: [facility] ENDO SUITE;  Service: Endoscopy;  Laterality: N/A;  patient received heparin at 530am given phenergan 73m IV 30 minutes before  . EYE SURGERY     lacrimal gland   . FLEXIBLE BRONCHOSCOPY N/A 06/04/2016   Procedure: FLEXIBLE BRONCHOSCOPY;  Surgeon: SAllyne Gee MD;  Location: [facility] ORS;  Service: Pulmonary;  Laterality: N/A;  . INCONTINENCE SURGERY  2004  . NASAL SINUS SURGERY    . THYROID LOBECTOMY    . TOTAL THYROIDECTOMY      Family Psychiatric History: Anxiety in family  Family History:  Family History  Problem Relation Age of Onset  . Arthritis/Rheumatoid Mother        died in age 4127s . Breast cancer Mother 422 .  Breast cancer Maternal Grandmother        40's  . Breast cancer Maternal Aunt        X 2. 50's  . Lung cancer Maternal Aunt   . Pancreatitis Neg Hx   . Colon cancer Neg Hx     Social History:  Social History   Socioeconomic History  . Marital status: Married    Spouse name: Not on file  . Number of children: 0  . Years of education: Not on file  . Highest education level: Not on file  Occupational History  . Not on file  Social Needs  . Financial resource strain: Not on file  . Food insecurity    Worry: Not on file    Inability: Not on file  . Transportation needs    Medical: Not on file    Non-medical: Not on file  Tobacco Use  . Smoking status: Never Smoker  . Smokeless tobacco: Never Used  Substance and Sexual Activity  . Alcohol use: No  . Drug use: No  . Sexual activity: Yes    Birth control/protection: Surgical  Lifestyle  . Physical activity    Days per week: Not on file    Minutes per session: Not on file  . Stress: Not on file  Relationships  . Social [place] phone: Not on file    Gets together: Not on file    Attends religious service: Not on file    Active member of club or organization: Not on file    Attends meetings of clubs or organizations: Not on file    Relationship status: Not on file  Other Topics Concern  . Not on file  Social History Narrative  . Not on file    Allergies:  Allergies  Allergen Reactions  . Aspirin   . Keflex [Cephalexin] Shortness Of Breath  . Azithromycin Hives and Nausea Only  . Barley Grass   . Clarithromycin Other (See Comments)  . Metoclopramide Other (See Comments)    TREMORS Tremors Tremors   . Oat Other (See Comments)  . Oatmeal Other (See Comments)  . Other     Other reaction(s): Other (See Comments) KEOPLEX.  .Marland KitchenRye Grass Flower Pollen Extract [Gramineae Pollens]   . Vioxx [Rofecoxib] Diarrhea  . Wheat Bran Other (See Comments)    Celiac disease  . Wheat Extract   . Celebrex  [Celecoxib] Hives  . Metoclopramide Hcl Anxiety    BODY TREMORS    Metabolic Disorder Labs: No results found for: HGBA1C, MPG No results found for: PROLACTIN Lab Results  Component Value Date   CHOL 189 03/03/2019   TRIG 121 03/03/2019   HDL 53 03/03/2019   LDLCALC 112 (  H) 03/03/2019   Lab Results  Component Value Date   TSH 0.621 03/03/2019   TSH 0.962 10/07/2013    Therapeutic Level Labs: No results found for: LITHIUM No results found for: VALPROATE No components found for:  CBMZ  Current Medications: Current Outpatient Medications  Medication Sig Dispense Refill  . acetaminophen (TYLENOL) 500 MG tablet Take by mouth.    Marland Kitchen albuterol (PROVENTIL HFA;VENTOLIN HFA) 108 (90 Base) MCG/ACT inhaler Inhale 2 puffs into the lungs every 6 (six) hours as needed for wheezing or shortness of breath. 1 Inhaler 2  . albuterol (PROVENTIL) (2.5 MG/3ML) 0.083% nebulizer solution Take 3 mLs (2.5 mg total) by nebulization every 6 (six) hours as needed for wheezing or shortness of breath. 75 mL 6  . ALPRAZolam (XANAX) 0.5 MG tablet Take 1 tablet (0.5 mg total) by mouth 2 (two) times daily as needed. FOR ANXIETY 60 tablet 2  . amitriptyline (ELAVIL) 25 MG tablet Take 1 tablet (25 mg total) by mouth at bedtime. 90 tablet 0  . carisoprodol (SOMA) 350 MG tablet Take 350 mg by mouth at bedtime. Patient states that she takes 1/2 tablet nightly    . Dentifrices (BIOTENE DRY MOUTH DT) by Transmucosal route.    . eletriptan (RELPAX) 40 MG tablet Take 40 mg by mouth as needed for migraine. may repeat in 2 hours if necessary     . EPINEPHRINE 0.3 mg/0.3 mL IJ SOAJ injection USE AS DIRECTED 1 Device 2  . estradiol (CLIMARA - DOSED IN MG/24 HR) 0.1 mg/24hr patch Place 1 patch (0.1 mg total) onto the skin once a week. 4 patch 9  . ezetimibe (ZETIA) 10 MG tablet Take 1 tablet by mouth once daily 90 tablet 0  . famotidine (PEPCID) 10 MG tablet Take 20 mg by mouth.    . fenofibrate (TRICOR) 145 MG tablet Take 1  tablet by mouth once daily 90 tablet 0  . fluticasone (FLONASE) 50 MCG/ACT nasal spray Place 2 sprays into both nostrils daily. 16 g 3  . fluticasone furoate-vilanterol (BREO ELLIPTA) 100-25 MCG/INH AEPB Inhale 1 puff into the lungs daily. 60 each 2  . gabapentin (NEURONTIN) 600 MG tablet Take 1/2 (one-half) tablet by mouth twice daily 45 tablet 3  . hydroxychloroquine (PLAQUENIL) 200 MG tablet Take by mouth 2 (two) times daily.    . hyoscyamine (NULEV) 0.125 MG TBDP Place 0.125 mg under the tongue every 4 (four) hours as needed for bladder spasms.     . Lactobacillus (DIGESTIVE HEALTH PROBIOTIC PO) Take 1 tablet by mouth daily.    . Levothyroxine Sodium 137 MCG CAPS Take 1 capsule (137 mcg total) by mouth daily before breakfast. 90 capsule 1  . montelukast (SINGULAIR) 10 MG tablet TAKE ONE TABLET BY MOUTH ONCE DAILY FOR ASTHMA 90 tablet 3  . mupirocin ointment (BACTROBAN) 2 %   0  . pantoprazole (PROTONIX) 40 MG tablet Take 40 mg by mouth daily.    Vladimir Faster Glycol-Propyl Glycol 0.4-0.3 % SOLN Apply to eye.    Marland Kitchen PRESCRIPTION MEDICATION 1 Syringe by Subdermal route once a week. Allergy shot    . sodium chloride (OCEAN) 0.65 % SOLN nasal spray Place 1 spray into both nostrils as needed for congestion.    . Tuberculin-Allergy Syringes 28G X 1/2" 1 ML MISC 1 each by Does not apply route every 14 (fourteen) days. For allergy injections 12 each 3  . Vaginal Lubricant (REPLENS) GEL Place vaginally.    . venlafaxine XR (EFFEXOR-XR) 150 MG 24  hr capsule Take 2 capsules (300 mg total) by mouth at bedtime. 180 capsule 0  . Vitamin D, Ergocalciferol, (DRISDOL) 1.25 MG (50000 UT) CAPS capsule TAKE 1 CAPSULE (50,000 UNITS TOTAL) BY MOUTH EVERY 7 (SEVEN) DAYS. MONDAY 4 capsule 0  . vitamin E 1000 UNIT capsule Take by mouth.     No current facility-administered medications for this visit.      Musculoskeletal: Strength & Muscle Tone: unable to assess due to telemed visit Gait & Station: unable to assess  due to telemed visit Patient leans: unable to assess due to telemed visit  Psychiatric Specialty Exam: ROS  There were no vitals taken for this visit.There is no height or weight on file to calculate BMI.  General Appearance: Fairly Groomed  Eye Contact:  Good  Speech:  Clear and Coherent and Normal Rate  Volume:  Normal  Mood:  Irritable  Affect:  Congruent  Thought Process:  Goal Directed, Linear and Descriptions of Associations: Intact  Orientation:  Full (Time, Place, and Person)  Thought Content: Logical and Fixated on Xanax and other prescriptions   Suicidal Thoughts:  No  Homicidal Thoughts:  No  Memory:  Recent;   Fair Remote;   Good  Judgement:  Fair  Insight:  Fair  Psychomotor Activity:  Normal  Concentration:  Concentration: Good and Attention Span: Good  Recall:  Good  Fund of Knowledge: Good  Language: Good  Akathisia:  Negative  Handed:  Right  AIMS (if indicated): not done  Assets:  Communication Skills Desire for Improvement Financial Resources/Insurance Housing Social Support  ADL's:  Intact  Cognition: WNL  Sleep:  Fair   Screenings: Mini-Mental     Clinical Support from 03/22/2019 in [facility], [facility]  Total Score (max 30 points )  30    PHQ2-9     Office Visit from 07/14/2019 in [facility], [place] from 03/22/2019 in [facility], [facility] Office Visit from 02/07/2019 in [facility], [facility] Office Visit from 09/07/2018 in [facility], [facility] Office Visit from 12/08/2017 in [facility], [facility]  PHQ-2 Total Score  0  0  0  0  0       Assessment and Plan: Patient stated that she continues to feel anxious and therefore requested increasing her dose.  Patient then insisted that she is issued 4 months prescriptions as she is traveling to [place].  She also wanted hard copies of prescriptions of Xanax. Patient was explained that she cannot be issued more than 3 prescriptions at a  time.  She was informed that I will be sending one prescription for Xanax to her current pharmacy and then will issue 2 hard copies of prescriptions for the following 2 months that can be picked up from the office.  After some deliberation patient agreed.  1. MDD (major depressive disorder), recurrent, in partial remission (H[facility])  - amitriptyline (ELAVIL) 25 MG tablet; Take 1 tablet (25 mg total) by mouth at bedtime.  Dispense: 90 tablet; Refill: 1 - venlafaxine XR (EFFEXOR-XR) 150 MG 24 hr capsule; Take 2 capsules (300 mg total) by mouth at bedtime.  Dispense: 180 capsule; Refill: 1  2. Generalized anxiety disorder  - amitriptyline (ELAVIL) 25 MG tablet; Take 1 tablet (25 mg total) by mouth at bedtime.  Dispense: 90 tablet; Refill: 1 - venlafaxine XR (EFFEXOR-XR) 150 MG 24 hr capsule; Take 2 capsules (300 mg total) by mouth at bedtime.  Dispense: 180 capsule; Refill: 1 - Increase ALPRAZolam (  XANAX) 0.5 MG tablet; Take 1 tablet (0.5 mg total) by mouth 3 (three) times daily as needed for sleep or anxiety.  Dispense: 90 tablet; Refill: 0  2 hard copies of xanax prsecription were issued with dates to be filled on November 03, 2019 and December 03 2019. She stated that she will call the office to schedule an appt with Dr. Modesta Messing when she returns back from [place]. F/up with Dr. Modesta Messing.  [place] Crane, MD 10/05/2019, 11:26 AM

## 2019-10-06 ENCOUNTER — Telehealth: Payer: Self-pay

## 2019-10-06 NOTE — Telephone Encounter (Signed)
Confirmed appointment with patient. klh °

## 2019-10-07 ENCOUNTER — Encounter: Payer: Self-pay | Admitting: Obstetrics & Gynecology

## 2019-10-08 ENCOUNTER — Other Ambulatory Visit: Payer: Self-pay | Admitting: Adult Health

## 2019-10-08 DIAGNOSIS — E559 Vitamin D deficiency, unspecified: Secondary | ICD-10-CM

## 2019-10-10 ENCOUNTER — Encounter: Payer: Self-pay | Admitting: Obstetrics & Gynecology

## 2019-10-10 ENCOUNTER — Ambulatory Visit
Admission: RE | Admit: 2019-10-10 | Discharge: 2019-10-10 | Disposition: A | Discharge status: Home / Self Care | Payer: Medicare HMO | Source: Ambulatory Visit | Attending: Internal Medicine | Admitting: Internal Medicine

## 2019-10-10 ENCOUNTER — Other Ambulatory Visit: Payer: Self-pay

## 2019-10-10 ENCOUNTER — Other Ambulatory Visit (HOSPITAL_COMMUNITY)
Admission: RE | Admit: 2019-10-10 | Discharge: 2019-10-10 | Disposition: A | Discharge status: Home / Self Care | Payer: Medicare HMO | Source: Ambulatory Visit | Attending: Obstetrics & Gynecology | Admitting: Obstetrics & Gynecology

## 2019-10-10 ENCOUNTER — Encounter: Payer: Self-pay | Admitting: Internal Medicine

## 2019-10-10 ENCOUNTER — Ambulatory Visit: Payer: Medicare HMO | Admitting: Internal Medicine

## 2019-10-10 ENCOUNTER — Ambulatory Visit (INDEPENDENT_AMBULATORY_CARE_PROVIDER_SITE_OTHER): Payer: Medicare HMO | Admitting: Obstetrics & Gynecology

## 2019-10-10 VITALS — BP 120/80 | Ht 65.0 in | Wt 188.0 lb

## 2019-10-10 VITALS — BP 123/84 | HR 72 | Temp 97.4°F | Resp 16 | Ht 65.0 in | Wt 189.0 lb

## 2019-10-10 DIAGNOSIS — R0602 Shortness of breath: Secondary | ICD-10-CM

## 2019-10-10 DIAGNOSIS — M3500 Sicca syndrome, unspecified: Secondary | ICD-10-CM

## 2019-10-10 DIAGNOSIS — Z01419 Encounter for gynecological examination (general) (routine) without abnormal findings: Secondary | ICD-10-CM | POA: Diagnosis not present

## 2019-10-10 DIAGNOSIS — Z1272 Encounter for screening for malignant neoplasm of vagina: Secondary | ICD-10-CM | POA: Insufficient documentation

## 2019-10-10 DIAGNOSIS — K219 Gastro-esophageal reflux disease without esophagitis: Secondary | ICD-10-CM

## 2019-10-10 DIAGNOSIS — Z90710 Acquired absence of both cervix and uterus: Secondary | ICD-10-CM | POA: Diagnosis not present

## 2019-10-10 DIAGNOSIS — R87622 Low grade squamous intraepithelial lesion on cytologic smear of vagina (LGSIL): Secondary | ICD-10-CM

## 2019-10-10 DIAGNOSIS — R079 Chest pain, unspecified: Secondary | ICD-10-CM | POA: Diagnosis not present

## 2019-10-10 DIAGNOSIS — J4520 Mild intermittent asthma, uncomplicated: Secondary | ICD-10-CM | POA: Diagnosis not present

## 2019-10-10 NOTE — Progress Notes (Signed)
[facility] [place], [place] [address]  Pulmonary Sleep Medicine   Office Visit Note  Patient Name: Susan [name] DOB: [birth year] MRN [id number]  Date of Service: 10/10/2019  Complaints/HPI: Patient is here for follow-up she has a history of Sjogren's syndrome she did have a prior history of pneumonia or aspiration and we had followed up with a CT scan at that time in 2017 to clearance.  She now states that she has been having some more shortness of breath with exertion.  She continues to have issues with dry mouth she is taking hydroxychloroquine for her Sjogren's syndrome.  No recent pulmonary function test done.  This will need to be scheduled.  She also has not had any evaluation of her pulmonary artery pressures.  She denies any chest pain she does state that on occasion she gets pain in her armpits.  Denies having any cough no congestion right now.  She still has significant reflux and she states on occasion she feels as though she does aspirate.  The patient has not had any recent admissions to the hospital.  ROS  General: (-) fever, (-) chills, (-) night sweats, (-) weakness Skin: (-) rashes, (-) itching,. Eyes: (-) visual changes, (-) redness, (-) itching. Nose and Sinuses: (-) nasal stuffiness or itchiness, (-) postnasal drip, (-) nosebleeds, (-) sinus trouble. Mouth and Throat: (-) sore throat, (-) hoarseness. Neck: (-) swollen glands, (-) enlarged thyroid, (-) neck pain. Respiratory: - cough, (-) bloody sputum, + shortness of breath, - wheezing. Cardiovascular: - ankle swelling, (-) chest pain. Lymphatic: (-) lymph node enlargement. Neurologic: (-) numbness, (-) tingling. Psychiatric: (-) anxiety, (-) depression   Current Medication: Outpatient Encounter Medications as of 10/10/2019  Medication Sig  . acetaminophen (TYLENOL) 500 MG tablet Take by mouth.  Marland Kitchen albuterol (PROVENTIL HFA;VENTOLIN HFA) 108 (90 Base) MCG/ACT inhaler Inhale 2 puffs  into the lungs every 6 (six) hours as needed for wheezing or shortness of breath.  Marland Kitchen albuterol (PROVENTIL) (2.5 MG/3ML) 0.083% nebulizer solution Take 3 mLs (2.5 mg total) by nebulization every 6 (six) hours as needed for wheezing or shortness of breath.  . ALPRAZolam (XANAX) 0.5 MG tablet Take 1 tablet (0.5 mg total) by mouth 3 (three) times daily as needed for sleep or anxiety.  Marland Kitchen amitriptyline (ELAVIL) 25 MG tablet Take 1 tablet (25 mg total) by mouth at bedtime.  . carisoprodol (SOMA) 350 MG tablet Take 350 mg by mouth at bedtime. Patient states that she takes 1/2 tablet nightly  . Dentifrices (BIOTENE DRY MOUTH DT) by Transmucosal route.  . eletriptan (RELPAX) 40 MG tablet Take 40 mg by mouth as needed for migraine. may repeat in 2 hours if necessary   . EPINEPHRINE 0.3 mg/0.3 mL IJ SOAJ injection USE AS DIRECTED  . estradiol (CLIMARA - DOSED IN MG/24 HR) 0.1 mg/24hr patch Place 1 patch (0.1 mg total) onto the skin once a week.  . ezetimibe (ZETIA) 10 MG tablet Take 1 tablet by mouth once daily  . famotidine (PEPCID) 10 MG tablet Take 20 mg by mouth.  . fenofibrate (TRICOR) 145 MG tablet Take 1 tablet by mouth once daily  . fluticasone (FLONASE) 50 MCG/ACT nasal spray Place 2 sprays into both nostrils daily.  . fluticasone furoate-vilanterol (BREO ELLIPTA) 100-25 MCG/INH AEPB Inhale 1 puff into the lungs daily.  Marland Kitchen gabapentin (NEURONTIN) 600 MG tablet Take 1/2 (one-half) tablet by mouth twice daily  . hydroxychloroquine (PLAQUENIL) 200 MG tablet Take by mouth 2 (two) times  daily.  . hyoscyamine (NULEV) 0.125 MG TBDP Place 0.125 mg under the tongue every 4 (four) hours as needed for bladder spasms.   . Lactobacillus (DIGESTIVE HEALTH PROBIOTIC PO) Take 1 tablet by mouth daily.  . Levothyroxine Sodium 137 MCG CAPS Take 1 capsule (137 mcg total) by mouth daily before breakfast.  . montelukast (SINGULAIR) 10 MG tablet TAKE ONE TABLET BY MOUTH ONCE DAILY FOR ASTHMA  . mupirocin ointment  (BACTROBAN) 2 %   . pantoprazole (PROTONIX) 40 MG tablet Take 40 mg by mouth daily.  Vladimir Faster Glycol-Propyl Glycol 0.4-0.3 % SOLN Apply to eye.  Marland Kitchen PRESCRIPTION MEDICATION 1 Syringe by Subdermal route once a week. Allergy shot  . sodium chloride (OCEAN) 0.65 % SOLN nasal spray Place 1 spray into both nostrils as needed for congestion.  . Tuberculin-Allergy Syringes 28G X 1/2" 1 ML MISC 1 each by Does not apply route every 14 (fourteen) days. For allergy injections  . Vaginal Lubricant (REPLENS) GEL Place vaginally.  . venlafaxine XR (EFFEXOR-XR) 150 MG 24 hr capsule Take 2 capsules (300 mg total) by mouth at bedtime.  . Vitamin D, Ergocalciferol, (DRISDOL) 1.25 MG (50000 UT) CAPS capsule TAKE 1 CAPSULE (50,000 UNITS TOTAL) BY MOUTH EVERY 7 (SEVEN) DAYS. MONDAY  . vitamin E 1000 UNIT capsule Take by mouth.   No facility-administered encounter medications on file as of 10/10/2019.    Surgical History: Past Surgical History:  Procedure Laterality Date  . ABDOMINAL HYSTERECTOMY  2006  . BLADDER REPAIR    . BREAST CYST ASPIRATION Left yrs ago  . CHOLECYSTECTOMY  1989  . COLONOSCOPY WITH ESOPHAGOGASTRODUODENOSCOPY (EGD)    . COLONOSCOPY WITH PROPOFOL N/A 08/03/2017   Procedure: COLONOSCOPY WITH PROPOFOL;  Surgeon: Lollie Sails, MD;  Location: [facility] ENDOSCOPY;  Service: Endoscopy;  Laterality: N/A;  . COLONOSCOPY WITH PROPOFOL N/A 10/18/2018   Procedure: COLONOSCOPY WITH PROPOFOL;  Surgeon: Lollie Sails, MD;  Location: [facility] ENDOSCOPY;  Service: Endoscopy;  Laterality: N/A;  . ESOPHAGOGASTRODUODENOSCOPY N/A 10/07/2013   Procedure: ESOPHAGOGASTRODUODENOSCOPY (EGD);  Surgeon: Danie Binder, MD;  Location: [facility] ENDO SUITE;  Service: Endoscopy;  Laterality: N/A;  patient received heparin at 530am given phenergan 85m IV 30 minutes before  . EYE SURGERY     lacrimal gland   . FLEXIBLE BRONCHOSCOPY N/A 06/04/2016   Procedure: FLEXIBLE BRONCHOSCOPY;  Surgeon: SAllyne Gee MD;  Location:  [facility] ORS;  Service: Pulmonary;  Laterality: N/A;  . INCONTINENCE SURGERY  2004  . NASAL SINUS SURGERY    . THYROID LOBECTOMY    . TOTAL THYROIDECTOMY      Medical History: Past Medical History:  Diagnosis Date  . Arthritis   . Asthma   . Asthmatic bronchitis   . BRCA negative 05/2014   MyRisk neg  . Celiac disease   . Celiac sprue   . Chronic anxiety   . Complication of anesthesia    vomiting  . COPD [place]ronic obstructive pulmonary disease) ([place]   . Depression   . Environmental allergies   . Family history of breast cancer 05/2014   MyRisk neg; IBIS=29.5%  . Fibromyalgia    neuropathy all over  . Gastritis 10/08/2013  . GERD (gastroesophageal reflux disease)   . Hyperlipemia   . Hyperlipidemia    Intolerant to statins  . Hypothyroidism (acquired)   . Increased risk of breast cancer 05/2014   IBIS=29.5%  . Increased risk of breast cancer 05/2014   IBIS=29.5%  . Migraine   . Migraines   .  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 Pancreatitis   . Sjogren's disease ([place])   . Spastic colon   . Thyroid cancer ([place]) 1990 and 1994   Total thyroidectomy with radioactive iodine.     Family History: Family History  Problem Relation Age of Onset  . Arthritis/Rheumatoid Mother        died in age 60s  . Breast cancer Mother 69  . Breast cancer Maternal Grandmother        40's  . Breast cancer Maternal Aunt        X 2. 50's  . Lung cancer Maternal Aunt   . Pancreatitis Neg Hx   . Colon cancer Neg Hx     Social History: Social History   Socioeconomic History  . Marital status: Married    Spouse name: Not on file  . Number of children: 0  . Years of education: Not on file  . Highest education level: Not on file  Occupational History  . Not on file  Tobacco Use  . Smoking status: Never Smoker  . Smokeless tobacco: Never Used  Substance and Sexual Activity  . Alcohol use: No  . Drug use: No  . Sexual activity: Yes    Birth control/protection: Surgical  Other  Topics Concern  . Not on file  Social History Narrative  . Not on file   Social Determinants of Health   Financial Resource Strain:   . Difficulty of Paying Living Expenses: Not on file  Food Insecurity:   . Worried About Charity fundraiser in the Last Year: Not on file  . Ran Out of Food in the Last Year: Not on file  Transportation Needs:   . Lack of Transportation (Medical): Not on file  . Lack of Transportation (Non-Medical): Not on file  Physical Activity:   . Days of Exercise per Week: Not on file  . Minutes of Exercise per Session: Not on file  Stress:   . Feeling of Stress : Not on file  Social Connections:   . Frequency of Communication with Friends and Family: Not on file  . Frequency of Social Gatherings with Friends and Family: Not on file  . Attends Religious Services: Not on file  . Active Member of Clubs or Organizations: Not on file  . Attends Archivist Meetings: Not on file  . Marital Status: Not on file  Intimate Partner Violence:   . Fear of Current or Ex-Partner: Not on file  . Emotionally Abused: Not on file  . Physically Abused: Not on file  . Sexually Abused: Not on file    Vital Signs: Blood pressure 123/84, pulse 72, temperature (!) 97.4 F (36.3 C), resp. rate 16, height _0  (1.651 m), weight 189 lb (85.7 kg), SpO2 98 %.  Examination: General Appearance: The patient is well-developed, well-nourished, and in no distress. Skin: Gross inspection of skin unremarkable. Head: normocephalic, no gross deformities. Eyes: no gross deformities noted. ENT: ears appear grossly normal no exudates. Neck: Supple. No thyromegaly. No LAD. Respiratory: No rhonchi no rales are noted at this time.. Cardiovascular: Normal S1 and S2 without murmur or rub. Extremities: No cyanosis. pulses are equal. Neurologic: Alert and oriented. No involuntary movements.  LABS: No results found for this or any previous visit (from the past 2160  hour(s)).  Radiology: MM 3D SCREEN BREAST BILATERAL  Result Date: 10/04/2019 CLINICAL DATA:  Screening. EXAM: DIGITAL SCREENING BILATERAL MAMMOGRAM WITH TOMO AND CAD COMPARISON:  Previous exam(s). ACR Breast Density Category  b: There are scattered areas of fibroglandular density. FINDINGS: There are no findings suspicious for malignancy. Images were processed with CAD. IMPRESSION: No mammographic evidence of malignancy. A result letter of this screening mammogram will be mailed directly to the patient. RECOMMENDATION: Screening mammogram in one year. (Code:SM-B-01Y) BI-RADS CATEGORY  1: Negative. Electronically Signed   By: Ammie Ferrier M.D.   On: 10/04/2019 15:47    No results found.  MM 3D SCREEN BREAST BILATERAL  Result Date: 10/04/2019 CLINICAL DATA:  Screening. EXAM: DIGITAL SCREENING BILATERAL MAMMOGRAM WITH TOMO AND CAD COMPARISON:  Previous exam(s). ACR Breast Density Category b: There are scattered areas of fibroglandular density. FINDINGS: There are no findings suspicious for malignancy. Images were processed with CAD. IMPRESSION: No mammographic evidence of malignancy. A result letter of this screening mammogram will be mailed directly to the patient. RECOMMENDATION: Screening mammogram in one year. (Code:SM-B-01Y) BI-RADS CATEGORY  1: Negative. Electronically Signed   By: Ammie Ferrier M.D.   On: 10/04/2019 15:47      Assessment and Plan: Patient Active Problem List   Diagnosis Date Noted  . MDD (major depressive disorder), recurrent, in partial remission ([place]) 10/05/2019  . Dysuria 04/03/2019  . Generalized anxiety disorder 02/28/2019  . Chronic pain of both knees 03/01/2018  . Encounter for general adult medical examination with abnormal findings 03/01/2018  . Primary osteoarthritis of both first carpometacarpal joints 03/01/2018  . Chronic obstructive pulmonary disease, unspecified ([place]) 12/04/2017  . Shortness of breath 12/04/2017  . Allergic rhinitis, unspecified  12/04/2017  . 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 Vaginal atrophy 09/01/2017  . Chronic right-sided low back pain without sciatica 01/05/2017  . Primary Sjogren's syndrome ([place]) 01/05/2017  . CD (celiac disease) 04/17/2015  . Gastroesophageal reflux disease with esophagitis 04/17/2015  . Hx of Sjogren's disease ([place]) 04/17/2015  . Acute asthma exacerbation 10/14/2013  . Acute respiratory failure ([place]) 10/14/2013  . Gastritis 10/08/2013  . Bradycardia 10/07/2013  . Esophageal spasm 10/06/2013  . Substernal precordial chest pain 10/06/2013  . Chronic anxiety 10/06/2013  . GERD (gastroesophageal reflux disease) 10/06/2013  . Chronic asthma 10/06/2013  . 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 Depression with anxiety 10/06/2013  . Fibromyalgia 10/06/2013  . Hypothyroidism 10/06/2013  . Chronic migraine 10/06/2013    1. Mild Asthma she has a history of mild intermittent asthma currently she states that she has been under good control.  Last spirometry which was done a while back" good.  I am going to get a follow-up PFT more further concern for Sjogren's been her asthma at this time. 2. Sjogrens sees [facility] continues to follow her closely I am not sure if she would be a candidate for rituximab will defer to the rheumatologist. 3. SOB pulmonary function ordered as well as a follow-up echocardiogram to assess her pulmonary pressures. 4. GERD continues to have some issues with reflux occasional aspirations she is taking usual precautions.  General Counseling: I have discussed the findings of the evaluation and examination with Donja.  I have also discussed any further diagnostic evaluation thatmay be needed or ordered today. Elisabet verbalizes understanding of the findings of todays visit. We also reviewed her medications today and discussed drug interactions and side effects including but not limited excessive drowsiness and altered mental states.  We also discussed that there is always a risk not just to her but also people around her. she has been encouraged to call the office with any questions or concerns  that should arise related to todays visit.  Orders Placed This Encounter  Procedures  . DG Chest 2 View    Standing Status:   Future    Standing Expiration Date:   12/10/2020    Order Specific Question:   Reason for Exam (SYMPTOM  OR DIAGNOSIS REQUIRED)    Answer:   SOB    Order Specific Question:   Preferred imaging location?    Answer:   [facility]    Order Specific Question:   Radiology Contrast Protocol - do NOT remove file path    Answer:   [email or web address]  . ECHOCARDIOGRAM COMPLETE    Standing Status:   Future    Standing Expiration Date:   01/07/2021    Order Specific Question:   Where should this test be performed    Answer:   External    Order Specific Question:   Perflutren DEFINITY (image enhancing agent) should be administered unless hypersensitivity or allergy exist    Answer:   Administer Perflutren    Order Specific Question:   Is a special reader required? (athlete or structural heart)    Answer:   No    Order Specific Question:   Reason for exam-Echo    Answer:   Dyspnea  786.09 / R06.00  . Pulmonary function test    Standing Status:   Future    Standing Expiration Date:   10/09/2020    Order Specific Question:   Where should this test be performed?    Answer:   [facility]     Time spent: 52mn  I have personally obtained a history, examined the patient, evaluated laboratory and imaging results, formulated the assessment and plan and placed orders.    SAllyne Gee MD [facility] Sleep medicine

## 2019-10-10 NOTE — Patient Instructions (Signed)
Sjgren's Syndrome Sjgren's syndrome is a disease in which the body's disease-fighting system (immune system) attacks the glands that produce tears (lacrimal glands) and the glands that produce saliva (salivary glands). This makes the eyes and mouth very dry. Sjgren's syndrome is a long-term (chronic) disorder that has no cure. In some cases, it is linked to other disorders (rheumatic disorders), such as rheumatoid arthritis and systemic lupus erythematosus (SLE). It may affect other parts of the body, such as the:  Kidneys.  Blood vessels.  Joints.  Lungs.  Liver.  Pancreas.  Brain.  Nerves.  Spinal cord. What are the causes? The cause of this condition is not known. It may be passed along from parent to child (inherited), or it may be a symptom of a rheumatic disorder. What increases the risk? This condition is more likely to develop in:  Women.  People who are 64 years old.  People who have recently had a viral infection or currently have a viral infection. What are the signs or symptoms? The main symptoms of this condition are:  Dry mouth. This may include: ? A chalky feeling. ? Difficulty swallowing, speaking, or tasting. ? Frequent cavities in the teeth. ? Frequent mouth infections.  Dry eyes. This may include: ? Burning, redness, and itching. ? Blurry vision. ? Light sensitivity. Other symptoms may include:  Dryness of the skin and the inside of the nose.  Eyelid infections.  Vaginal dryness, if this applies.  Joint pain and stiffness.  Muscle pain and stiffness. How is this diagnosed? This condition is diagnosed based on:  Your symptoms.  Your medical history.  A physical exam of your eyes and mouth.  Tests, including: ? A Schirmer test. This tests your tear production. ? An eye exam that is done with a magnifying device (slit-lamp exam). ? An eye test that temporarily stains your eye with dye. This shows the extent of eye  damage. ? Tests to check your salivary gland function. ? Biopsy. This is a removal of part of a salivary gland from inside your lower lip to be studied under a microscope. ? Chest X-rays. ? Blood tests. ? Urine tests. How is this treated? There is no cure for this condition, but treatment can help you manage your symptoms. This condition may be treated with:  Moisture replacement therapies to help relieve dryness in your skin, mouth, and eyes.  Medicines to help relieve pain and stiffness.  Medicines to help relieve inflammation in your body (corticosteroids). These are usually for severe cases.  Medicines to help reduce the activity of your immune system (immunosuppressants).  Surgery or insertion of plugs to close the lacrimal glands (punctal occlusion). This helps keep more natural tears in your eyes. Follow these instructions at home: Eye care   Use eye drops as told by your health care provider.  Protect your eyes from the sun and wind with sunglasses or glasses.  Blink at least 5-6 times a minute.  Maintain properly humidified air. You may want to use a humidifier at home.  Avoid smoke. Mouth care  Brush your teeth and floss after every meal.  Chew sugar-free gum or suck on hard candy. This may help to relieve dry mouth.  Use antimicrobial mouthwash daily.  Take frequent sips of water or sugar-free drinks.  Use saliva substitutes or lip balm as told by your health care provider.  Schedule and attend dentist visits every 6 months. General instructions   Take over-the-counter and prescription medicines only as told by  your health care provider.  Drink enough fluid to keep your urine pale yellow.  Keep all follow-up visits as told by your health care provider. This is important. Contact a health care provider if:  You have a fever.  You have night sweats.  You are always tired.  You have unexplained weight loss.  You develop itchy skin.  You have red  patches on your skin.  You have a lump or swelling on your neck. Summary  Sjgren's syndrome is a disease in which the body's disease-fighting system attacks the glands that produce tears and the glands that produce saliva.  This condition makes the eyes and mouth very dry.  Sjgren's syndrome is a long-term (chronic) disorder that has no cure.  The cause of this condition is not known.  There is no cure for this condition, but treatment can help you manage your symptoms. This information is not intended to replace advice given to you by your health care provider. Make sure you discuss any questions you have with your health care provider. Document Released: 10/03/2002 Document Revised: 08/19/2018 Document Reviewed: 08/19/2018 Elsevier Patient Education  2020 [employer].

## 2019-10-10 NOTE — Patient Instructions (Signed)
PAP every three years if normal today Mammogram every year Colonoscopy every few years based on Gastroenterology recommendations Labs yearly (with PCP)

## 2019-10-10 NOTE — Progress Notes (Signed)
HPI:      [name] is a 63 y.o. G0P0000 who LMP was in the past, she presents today for her annual examination.  The patient has no complaints today. The patient is sexually active and has some discomfort w sex. Herlast pap: approximate date 2019 and was normal and 2018 LGSIL of vagina (prior hyst) and last mammogram: approximate date 10/04/2019 and was normal.  The patient does perform self breast exams.  There is notable family history of breast or ovarian cancer in her family. BRCA NEG 3 years ago.  The patient has stopped taking hormone replacement therapy and feels no different at this time.  Patches did not stick well ([place], in past). Patient denies post-menopausal vaginal bleeding.   The patient has regular exercise: yes. The patient denies current symptoms of depression.    GYN Hx: Last Colonoscopy:1 year ago. Normal.    PMHx: Past Medical History:  Diagnosis Date  . Arthritis   . Asthma   . Asthmatic bronchitis   . BRCA negative 05/2014   MyRisk neg  . Celiac disease   . Celiac sprue   . Chronic anxiety   . Complication of anesthesia    vomiting  . COPD (chronic obstructive pulmonary disease) ([place])   . Depression   . Environmental allergies   . Family history of breast cancer 05/2014   MyRisk neg; IBIS=29.5%  . Fibromyalgia    neuropathy all over  . Gastritis 10/08/2013  . GERD (gastroesophageal reflux disease)   . Hyperlipemia   . Hyperlipidemia    Intolerant to statins  . Hypothyroidism (acquired)   . Increased risk of breast cancer 05/2014   IBIS=29.5%  . Increased risk of breast cancer 05/2014   IBIS=29.5%  . Migraine   . Migraines   . 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 Pancreatitis   . Sjogren's disease ([place])   . Spastic colon   . Thyroid cancer ([place]) 1990 and 1994   Total thyroidectomy with radioactive iodine.    Past Surgical History:  Procedure Laterality Date  . ABDOMINAL HYSTERECTOMY  2006  . BLADDER REPAIR    . BREAST CYST  ASPIRATION Left yrs ago  . CHOLECYSTECTOMY  1989  . COLONOSCOPY WITH ESOPHAGOGASTRODUODENOSCOPY (EGD)    . COLONOSCOPY WITH PROPOFOL N/A 08/03/2017   Procedure: COLONOSCOPY WITH PROPOFOL;  Surgeon: Lollie Sails, MD;  Location[facility] END[facility]COPY;  Service: Endoscopy;  Laterality: N/A;  . COLONOSCOPY WITH PROPOFOL N/A 10/18/2018   Procedure: COLONOSCOPY WITH PROPOFOL;  Surgeon: Lollie Sails, MD;  Location: [facility] ENDOSCOPY;  Service: Endoscopy;  Laterality: N/A;  . ESOPHAGOGASTRODUODENOSCOPY N/A 10/07/2013   Procedure: ESOPHAGOGASTRODUODENOSCOPY (EGD);  Surgeon: Danie Binder, MD;  Location: [facility] ENDO SUITE;  Service: Endoscopy;  Laterality: N/A;  patient received heparin at 530am given phenergan 20m IV 30 minutes before  . EYE SURGERY     lacrimal gland   . FLEXIBLE BRONCHOSCOPY N/A 06/04/2016   Procedure: FLEXIBLE BRONCHOSCOPY;  Surgeon: SAllyne Gee MD;  Location: [facility] ORS;  Service: Pulmonary;  Laterality: N/A;  . INCONTINENCE SURGERY  2004  . NASAL SINUS SURGERY    . THYROID LOBECTOMY    . TOTAL THYROIDECTOMY     Family History  Problem Relation Age of Onset  . Arthritis/Rheumatoid Mother        died in age 2251s . Breast cancer Mother 427 . Breast cancer Maternal Grandmother        40's  . Breast cancer Maternal Aunt  X 2. 50's  . Lung cancer Maternal Aunt   . Pancreatitis Neg Hx   . Colon cancer Neg Hx    Social History   Tobacco Use  . Smoking status: Never Smoker  . Smokeless tobacco: Never Used  Substance Use Topics  . Alcohol use: No  . Drug use: No    Current Outpatient Medications:  .  acetaminophen (TYLENOL) 500 MG tablet, Take by mouth., Disp: , Rfl:  .  albuterol (PROVENTIL HFA;VENTOLIN HFA) 108 (90 Base) MCG/ACT inhaler, Inhale 2 puffs into the lungs every 6 (six) hours as needed for wheezing or shortness of breath., Disp: 1 Inhaler, Rfl: 2 .  albuterol (PROVENTIL) (2.5 MG/3ML) 0.083% nebulizer solution, Take 3 mLs (2.5 mg total) by nebulization  every 6 (six) hours as needed for wheezing or shortness of breath., Disp: 75 mL, Rfl: 6 .  ALPRAZolam (XANAX) 0.5 MG tablet, Take 1 tablet (0.5 mg total) by mouth 3 (three) times daily as needed for sleep or anxiety., Disp: 90 tablet, Rfl: 0 .  amitriptyline (ELAVIL) 25 MG tablet, Take 1 tablet (25 mg total) by mouth at bedtime., Disp: 90 tablet, Rfl: 1 .  carisoprodol (SOMA) 350 MG tablet, Take 350 mg by mouth at bedtime. Patient states that she takes 1/2 tablet nightly, Disp: , Rfl:  .  Dentifrices (BIOTENE DRY MOUTH DT), by Transmucosal route., Disp: , Rfl:  .  eletriptan (RELPAX) 40 MG tablet, Take 40 mg by mouth as needed for migraine. may repeat in 2 hours if necessary , Disp: , Rfl:  .  EPINEPHRINE 0.3 mg/0.3 mL IJ SOAJ injection, USE AS DIRECTED, Disp: 1 Device, Rfl: 2 .  estradiol (CLIMARA - DOSED IN MG/24 HR) 0.1 mg/24hr patch, Place 1 patch (0.1 mg total) onto the skin once a week., Disp: 4 patch, Rfl: 9 .  ezetimibe (ZETIA) 10 MG tablet, Take 1 tablet by mouth once daily, Disp: 90 tablet, Rfl: 0 .  famotidine (PEPCID) 10 MG tablet, Take 20 mg by mouth., Disp: , Rfl:  .  fenofibrate (TRICOR) 145 MG tablet, Take 1 tablet by mouth once daily, Disp: 90 tablet, Rfl: 0 .  fluticasone (FLONASE) 50 MCG/ACT nasal spray, Place 2 sprays into both nostrils daily., Disp: 16 g, Rfl: 3 .  fluticasone furoate-vilanterol (BREO ELLIPTA) 100-25 MCG/INH AEPB, Inhale 1 puff into the lungs daily., Disp: 60 each, Rfl: 2 .  gabapentin (NEURONTIN) 600 MG tablet, Take 1/2 (one-half) tablet by mouth twice daily, Disp: 45 tablet, Rfl: 3 .  hydroxychloroquine (PLAQUENIL) 200 MG tablet, Take by mouth 2 (two) times daily., Disp: , Rfl:  .  hyoscyamine (NULEV) 0.125 MG TBDP, Place 0.125 mg under the tongue every 4 (four) hours as needed for bladder spasms. , Disp: , Rfl:  .  Lactobacillus (DIGESTIVE HEALTH PROBIOTIC PO), Take 1 tablet by mouth daily., Disp: , Rfl:  .  Levothyroxine Sodium 137 MCG CAPS, Take 1 capsule  (137 mcg total) by mouth daily before breakfast., Disp: 90 capsule, Rfl: 1 .  montelukast (SINGULAIR) 10 MG tablet, TAKE ONE TABLET BY MOUTH ONCE DAILY FOR ASTHMA, Disp: 90 tablet, Rfl: 3 .  mupirocin ointment (BACTROBAN) 2 %, , Disp: , Rfl: 0 .  pantoprazole (PROTONIX) 40 MG tablet, Take 40 mg by mouth daily., Disp: , Rfl:  .  Polyethyl Glycol-Propyl Glycol 0.4-0.3 % SOLN, Apply to eye., Disp: , Rfl:  .  PRESCRIPTION MEDICATION, 1 Syringe by Subdermal route once a week. Allergy shot, Disp: , Rfl:  .  sodium  chloride (OCEAN) 0.65 % SOLN nasal spray, Place 1 spray into both nostrils as needed for congestion., Disp: , Rfl:  .  Tuberculin-Allergy Syringes 28G X 1/2" 1 ML MISC, 1 each by Does not apply route every 14 (fourteen) days. For allergy injections, Disp: 12 each, Rfl: 3 .  Vaginal Lubricant (REPLENS) GEL, Place vaginally., Disp: , Rfl:  .  venlafaxine XR (EFFEXOR-XR) 150 MG 24 hr capsule, Take 2 capsules (300 mg total) by mouth at bedtime., Disp: 180 capsule, Rfl: 1 .  Vitamin D, Ergocalciferol, (DRISDOL) 1.25 MG (50000 UT) CAPS capsule, TAKE 1 CAPSULE (50,000 UNITS TOTAL) BY MOUTH EVERY 7 (SEVEN) DAYS. MONDAY, Disp: 4 capsule, Rfl: 2 .  vitamin E 1000 UNIT capsule, Take by mouth., Disp: , Rfl:  Allergies: Aspirin, Keflex [cephalexin], Azithromycin, Barley grass, Clarithromycin, Metoclopramide, Oat, Oatmeal, Other, Rye grass flower pollen extract [gramineae pollens], Vioxx [rofecoxib], Wheat bran, Wheat extract, Celebrex [celecoxib], and Metoclopramide hcl  Review of Systems  Constitutional: Positive for malaise/fatigue. Negative for chills and fever.  HENT: Negative for congestion, sinus pain and sore throat.   Eyes: Negative for blurred vision and pain.  Respiratory: Negative for cough and wheezing.   Cardiovascular: Negative for chest pain and leg swelling.  Gastrointestinal: Negative for abdominal pain, constipation, diarrhea, heartburn, nausea and vomiting.  Genitourinary: Negative for  dysuria, frequency, hematuria and urgency.  Musculoskeletal: Positive for joint pain. Negative for back pain, myalgias and neck pain.  Skin: Negative for itching and rash.  Neurological: Positive for weakness and headaches. Negative for dizziness and tremors.  Endo/Heme/Allergies: Does not bruise/bleed easily.  Psychiatric/Behavioral: Positive for depression. The patient is nervous/anxious. The patient does not have insomnia.     Objective: BP 120/80   Ht _0  (1.651 m)   Wt 188 lb (85.3 kg)   BMI 31.28 kg/m   Filed Weights   10/10/19 1345  Weight: 188 lb (85.3 kg)   Body mass index is 31.28 kg/m. Physical Exam Constitutional:      General: She is not in acute distress.    Appearance: She is well-developed.  Genitourinary:     Pelvic exam was performed with patient supine.     Vagina and rectum normal.     No lesions in the vagina.     No vaginal bleeding.     No right or left adnexal mass present.     Right adnexa not tender.     Left adnexa not tender.     Genitourinary Comments: Absent Uterus Absent cervix Vaginal cuff well healed  HENT:     Head: Normocephalic and atraumatic. No laceration.     Right Ear: Hearing normal.     Left Ear: Hearing normal.     Mouth/Throat:     Pharynx: Uvula midline.  Eyes:     Pupils: Pupils are equal, round, and reactive to light.  Neck:     Thyroid: No thyromegaly.  Cardiovascular:     Rate and Rhythm: Normal rate and regular rhythm.     Heart sounds: No murmur. No friction rub. No gallop.   Pulmonary:     Effort: Pulmonary effort is normal. No respiratory distress.     Breath sounds: Normal breath sounds. No wheezing.  Chest:     Breasts:        Right: No mass, skin change or tenderness.        Left: No mass, skin change or tenderness.  Abdominal:     General: Bowel sounds are normal. There is  no distension.     Palpations: Abdomen is soft.     Tenderness: There is no abdominal tenderness. There is no rebound.    Musculoskeletal:        General: Normal range of motion.     Cervical back: Normal range of motion and neck supple.  Neurological:     Mental Status: She is alert and oriented to person, place, and time.     Cranial Nerves: No cranial nerve deficit.  Skin:    General: Skin is warm and dry.  Psychiatric:        Judgment: Judgment normal.  Vitals reviewed.     Assessment: Annual Exam 1. 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 Women's annual routine gynecological examination     Plan:            1.  Vaginal Screening-  Pap smear done today  2. Breast screening- Exam annually and mammogram scheduled  3. Colonoscopy every few years per GI, Hemoccult testing after age 21  4. Labs managed by PCP  5. Counseling for hormonal therapy: wants to stop due to patches not sticking well and does not notce vasomotor symptoms worsening              6. FRAX - FRAX score for assessing the 10 year probability for fracture calculated and discussed today.  Based on age and score today, DEXA is not currently scheduled.  Consider next year.    F/U  Return in about 1 year (around 10/09/2020) for Annual.  Barnett Applebaum, MD, [facility], [facility] 10/10/2019  2:13 PM

## 2019-10-11 ENCOUNTER — Telehealth: Payer: Self-pay

## 2019-10-11 DIAGNOSIS — G43719 Chronic migraine without aura, intractable, without status migrainosus: Secondary | ICD-10-CM | POA: Diagnosis not present

## 2019-10-11 DIAGNOSIS — G250 Essential tremor: Secondary | ICD-10-CM | POA: Diagnosis not present

## 2019-10-11 NOTE — Telephone Encounter (Signed)
Left a message asking pt to call back and give [place] date of when she will need her new allergy vial mixed and picked up by. aslo advising her pft and echo will be scheduled march 2021 ok'd from dr Humphrey Rolls, [place]

## 2019-10-12 LAB — CYTOLOGY - PAP
Diagnosis: NEGATIVE
Diagnosis: REACTIVE

## 2019-10-14 ENCOUNTER — Other Ambulatory Visit: Payer: Self-pay

## 2019-10-14 ENCOUNTER — Encounter: Payer: Self-pay | Admitting: Adult Health

## 2019-10-14 ENCOUNTER — Ambulatory Visit (INDEPENDENT_AMBULATORY_CARE_PROVIDER_SITE_OTHER): Payer: Medicare HMO | Admitting: Adult Health

## 2019-10-14 VITALS — BP 124/80 | HR 74 | Temp 97.6°F | Resp 16 | Ht 65.0 in | Wt 187.6 lb

## 2019-10-14 DIAGNOSIS — M19041 Primary osteoarthritis, right hand: Secondary | ICD-10-CM | POA: Diagnosis not present

## 2019-10-14 DIAGNOSIS — F411 Generalized anxiety disorder: Secondary | ICD-10-CM | POA: Diagnosis not present

## 2019-10-14 DIAGNOSIS — M3500 Sicca syndrome, unspecified: Secondary | ICD-10-CM

## 2019-10-14 DIAGNOSIS — M19042 Primary osteoarthritis, left hand: Secondary | ICD-10-CM

## 2019-10-14 DIAGNOSIS — R69 Illness, unspecified: Secondary | ICD-10-CM | POA: Diagnosis not present

## 2019-10-14 DIAGNOSIS — J4520 Mild intermittent asthma, uncomplicated: Secondary | ICD-10-CM | POA: Diagnosis not present

## 2019-10-14 MED ORDER — PREDNISONE 10 MG PO TABS: ORAL_TABLET | ORAL | 0 refills | Status: DC

## 2019-10-14 MED ORDER — HYDROCODONE-ACETAMINOPHEN 7.5-325 MG PO TABS: 1.0000 | ORAL_TABLET | Freq: Four times a day (QID) | ORAL | 0 refills | Status: DC | PRN

## 2019-10-14 NOTE — Progress Notes (Signed)
[facility] [place], [place] [address]  Internal MEDICINE  Office Visit Note  Patient Name: Susan [name]  [id number]  [id number]  Date of Service: 10/27/2019  Chief Complaint  Patient presents with  . Hand Pain    both hands   . Osteoarthritis     HPI Pt is here for a sick visit. Pt here reporting bilateral hand pain, swelling.  She reports a history of Carpal tunnel, and osteoarthritis of the hands.  She has seen rheumatology in the past. They gave her injections about a year ago, and she reports that helped for about 2 weeks. She has had virtual visit with them 4 months ago. She describes "vein pain" as burning, and tingling.  She reports she takes gabapentin for this.  She appears to be describing nerve pain.        Current Medication:  Outpatient Encounter Medications as of 10/14/2019  Medication Sig  . acetaminophen (TYLENOL) 500 MG tablet Take by mouth.  Marland Kitchen albuterol (PROVENTIL HFA;VENTOLIN HFA) 108 (90 Base) MCG/ACT inhaler Inhale 2 puffs into the lungs every 6 (six) hours as needed for wheezing or shortness of breath.  Marland Kitchen albuterol (PROVENTIL) (2.5 MG/3ML) 0.083% nebulizer solution Take 3 mLs (2.5 mg total) by nebulization every 6 (six) hours as needed for wheezing or shortness of breath.  . ALPRAZolam (XANAX) 0.5 MG tablet Take 1 tablet (0.5 mg total) by mouth 3 (three) times daily as needed for sleep or anxiety.  Marland Kitchen amitriptyline (ELAVIL) 25 MG tablet Take 1 tablet (25 mg total) by mouth at bedtime.  . carisoprodol (SOMA) 350 MG tablet Take 350 mg by mouth at bedtime. Patient states that she takes 1/2 tablet nightly  . Dentifrices (BIOTENE DRY MOUTH DT) by Transmucosal route.  . eletriptan (RELPAX) 40 MG tablet Take 40 mg by mouth as needed for migraine. may repeat in 2 hours if necessary   . EPINEPHRINE 0.3 mg/0.3 mL IJ SOAJ injection USE AS DIRECTED  . estradiol (CLIMARA - DOSED IN MG/24 HR) 0.1 mg/24hr patch Place 1 patch (0.1 mg total)  onto the skin once a week.  . famotidine (PEPCID) 10 MG tablet Take 20 mg by mouth.  . fluticasone (FLONASE) 50 MCG/ACT nasal spray Place 2 sprays into both nostrils daily.  . fluticasone furoate-vilanterol (BREO ELLIPTA) 100-25 MCG/INH AEPB Inhale 1 puff into the lungs daily.  Marland Kitchen gabapentin (NEURONTIN) 600 MG tablet Take 1/2 (one-half) tablet by mouth twice daily  . hydroxychloroquine (PLAQUENIL) 200 MG tablet Take by mouth 2 (two) times daily.  . hyoscyamine (NULEV) 0.125 MG TBDP Place 0.125 mg under the tongue every 4 (four) hours as needed for bladder spasms.   . Lactobacillus (DIGESTIVE HEALTH PROBIOTIC PO) Take 1 tablet by mouth daily.  . Levothyroxine Sodium 137 MCG CAPS Take 1 capsule (137 mcg total) by mouth daily before breakfast.  . montelukast (SINGULAIR) 10 MG tablet TAKE ONE TABLET BY MOUTH ONCE DAILY FOR ASTHMA  . mupirocin ointment (BACTROBAN) 2 %   . pantoprazole (PROTONIX) 40 MG tablet Take 40 mg by mouth daily.  Vladimir Faster Glycol-Propyl Glycol 0.4-0.3 % SOLN Apply to eye.  Marland Kitchen PRESCRIPTION MEDICATION 1 Syringe by Subdermal route once a week. Allergy shot  . sodium chloride (OCEAN) 0.65 % SOLN nasal spray Place 1 spray into both nostrils as needed for congestion.  . Vaginal Lubricant (REPLENS) GEL Place vaginally.  . venlafaxine XR (EFFEXOR-XR) 150 MG 24 hr capsule Take 2 capsules (300 mg total) by mouth at  bedtime.  . Vitamin D, Ergocalciferol, (DRISDOL) 1.25 MG (50000 UT) CAPS capsule TAKE 1 CAPSULE (50,000 UNITS TOTAL) BY MOUTH EVERY 7 (SEVEN) DAYS. MONDAY  . vitamin E 1000 UNIT capsule Take by mouth.  . [DISCONTINUED] ezetimibe (ZETIA) 10 MG tablet Take 1 tablet by mouth once daily  . [DISCONTINUED] fenofibrate (TRICOR) 145 MG tablet Take 1 tablet by mouth once daily  . [DISCONTINUED] Tuberculin-Allergy Syringes 28G X 1/2" 1 ML MISC 1 each by Does not apply route every 14 (fourteen) days. For allergy injections  . HYDROcodone-acetaminophen (NORCO) 7.5-325 MG tablet Take 1-2  tablets by mouth every 6 (six) hours as needed for moderate pain.  . [DISCONTINUED] predniSONE (DELTASONE) 10 MG tablet 12 day taper.  24m on days 1 and 2, 532mon days 3 and 4, 4012mn days 5 and 6, 52m50m days 7 and 8, 20mg14mdays 9 and 10, 10 mg on days 11 and 12.   No facility-administered encounter medications on file as of 10/14/2019.      Medical History: Past Medical History:  Diagnosis Date  . Arthritis   . Asthma   . Asthmatic bronchitis   . BRCA negative 05/2014   MyRisk neg  . Celiac disease   . Celiac sprue   . Chronic anxiety   . Complication of anesthesia    vomiting  . COPD (chronic obstructive pulmonary disease) (HCC) [place] Depression   . Environmental allergies   . Family history of breast cancer 05/2014   MyRisk neg; IBIS=29.5%  . Fibromyalgia    neuropathy all over  . Gastritis 10/08/2013  . GERD (gastroesophageal reflux disease)   . Hyperlipemia   . Hyperlipidemia    Intolerant to statins  . Hypothyroidism (acquired)   . Increased risk of breast cancer 05/2014   IBIS=29.5%  . Increased risk of breast cancer 05/2014   IBIS=29.5%  . Migraine   . Migraines   . 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 Pancreatitis   . Sjogren's disease (HCC) [place] Spastic colon   . Thyroid cancer (HCC) [place]0 and 1994   Total thyroidectomy with radioactive iodine.      Vital Signs: BP 124/80   Pulse 74   Temp 97.6 F (36.4 C)   Resp 16   Ht 5' 5"  (1.651 m)   Wt 187 lb 9.6 oz (85.1 kg)   SpO2 97%   BMI 31.22 kg/m    Review of Systems  Constitutional: Negative for chills, fatigue and unexpected weight change.  HENT: Negative for congestion, rhinorrhea, sneezing and sore throat.   Eyes: Negative for photophobia, pain and redness.  Respiratory: Negative for cough, chest tightness and shortness of breath.   Cardiovascular: Negative for chest pain and palpitations.  Gastrointestinal: Negative for abdominal pain, constipation, diarrhea, nausea and vomiting.   Endocrine: Negative.   Genitourinary: Negative for dysuria and frequency.  Musculoskeletal: Negative for arthralgias, back pain, joint swelling and neck pain.       Bilateral hand pain and swelling.  Skin: Negative for rash.  Allergic/Immunologic: Negative.   Neurological: Negative for tremors and numbness.  Hematological: Negative for adenopathy. Does not bruise/bleed easily.  Psychiatric/Behavioral: Negative for behavioral problems and sleep disturbance. The patient is not nervous/anxious.     Physical Exam Vitals and nursing note reviewed.  Constitutional:      General: She is not in acute distress.    Appearance: She is well-developed. She is not diaphoretic.  HENT:     Head: Normocephalic and  atraumatic.     Mouth/Throat:     Pharynx: No oropharyngeal exudate.  Eyes:     Pupils: Pupils are equal, round, and reactive to light.  Neck:     Thyroid: No thyromegaly.     Vascular: No JVD.     Trachea: No tracheal deviation.  Cardiovascular:     Rate and Rhythm: Normal rate and regular rhythm.     Heart sounds: Normal heart sounds. No murmur. No friction rub. No gallop.   Pulmonary:     Effort: Pulmonary effort is normal. No respiratory distress.     Breath sounds: Normal breath sounds. No wheezing or rales.  Chest:     Chest wall: No tenderness.  Abdominal:     Palpations: Abdomen is soft.     Tenderness: There is no abdominal tenderness. There is no guarding.  Musculoskeletal:        General: Normal range of motion.     Cervical back: Normal range of motion and neck supple.  Lymphadenopathy:     Cervical: No cervical adenopathy.  Skin:    General: Skin is warm and dry.  Neurological:     Mental Status: She is alert and oriented to person, place, and time.     Cranial Nerves: No cranial nerve deficit.  Psychiatric:        Behavior: Behavior normal.        Thought Content: Thought content normal.        Judgment: Judgment normal.       Assessment/Plan: 1.  Primary osteoarthritis of both hands Use prednisone as directed.  Reviewed risks and possible side effects associated with taking opiates, benzodiazepines and other CNS depressants. Combination of these could cause dizziness and drowsiness. Advised patient not to drive or operate machinery when taking these medications, as patient's and other's life can be at risk and will have consequences. Patient verbalized understanding in this matter. Dependence and abuse for these drugs will be monitored closely. A Controlled substance policy and procedure is on file which allows [facility] to order a urine drug screen test at any visit. Patient understands and agrees with the plan - predniSONE (DELTASONE) 10 MG tablet; 12 day taper.  39m on days 1 and 2, 562mon days 3 and 4, 4027mn days 5 and 6, 54m23m days 7 and 8, 20mg23mdays 9 and 10, 10 mg on days 11 and 12.  Dispense: 42 tablet; Refill: 0 - HYDROcodone-acetaminophen (NORCO) 7.5-325 MG tablet; Take 1-2 tablets by mouth every 6 (six) hours as needed for moderate pain.  Dispense: 15 tablet; Refill: 0  2. Mild intermittent chronic asthma without complication Stable, continue current management.  3. Hx of Sjogren's disease (HCC) [place], continue to follow  4. Generalized anxiety disorder Stable   General Counseling: Karenvesta wheelandrstanding of the findings of todays visit and agrees with plan of treatment. I have discussed any further diagnostic evaluation that may be needed or ordered today. We also reviewed her medications today. she has been encouraged to call the office with any questions or concerns that should arise related to todays visit.   No orders of the defined types were placed in this encounter.   Meds ordered this encounter  Medications  . DISCONTD: predniSONE (DELTASONE) 10 MG tablet    Sig: 12 day taper.  60mg 68mays 1 and 2, 50mg o103mys 3 and 4, 40mg on38ms 5 and 6, 54mg on 61m 7 and 8, 20mg on d99m  9 and  10, 10 mg on days 11 and 12.    Dispense:  42 tablet    Refill:  0  . HYDROcodone-acetaminophen (NORCO) 7.5-325 MG tablet    Sig: Take 1-2 tablets by mouth every 6 (six) hours as needed for moderate pain.    Dispense:  15 tablet    Refill:  0    Time spent: 25 Minutes  This patient was seen by Orson Gear AGNP-C in Collaboration with Dr Lavera Guise as a part of collaborative care agreement.  Kendell Bane AGNP-C Internal Medicine

## 2019-10-19 ENCOUNTER — Other Ambulatory Visit: Payer: Self-pay

## 2019-10-19 DIAGNOSIS — E785 Hyperlipidemia, unspecified: Secondary | ICD-10-CM

## 2019-10-19 MED ORDER — FENOFIBRATE 145 MG PO TABS: 145.0000 mg | ORAL_TABLET | Freq: Every day | ORAL | 1 refills | Status: DC

## 2019-10-19 MED ORDER — EZETIMIBE 10 MG PO TABS: 10.0000 mg | ORAL_TABLET | Freq: Every day | ORAL | 1 refills | Status: DC

## 2019-10-20 ENCOUNTER — Telehealth: Payer: Self-pay

## 2019-10-20 NOTE — Telephone Encounter (Signed)
Confirmed appointment with patient. klh °

## 2019-10-25 ENCOUNTER — Ambulatory Visit (INDEPENDENT_AMBULATORY_CARE_PROVIDER_SITE_OTHER): Payer: Medicare HMO | Admitting: Nurse Practitioner

## 2019-10-25 ENCOUNTER — Ambulatory Visit (INDEPENDENT_AMBULATORY_CARE_PROVIDER_SITE_OTHER): Payer: Medicare HMO

## 2019-10-25 ENCOUNTER — Encounter: Payer: Self-pay | Admitting: Nurse Practitioner

## 2019-10-25 ENCOUNTER — Other Ambulatory Visit: Payer: Self-pay

## 2019-10-25 VITALS — BP 124/81 | HR 81 | Resp 16 | Ht 65.0 in | Wt 191.2 lb

## 2019-10-25 DIAGNOSIS — M25541 Pain in joints of right hand: Secondary | ICD-10-CM | POA: Diagnosis not present

## 2019-10-25 DIAGNOSIS — M19041 Primary osteoarthritis, right hand: Secondary | ICD-10-CM | POA: Diagnosis not present

## 2019-10-25 DIAGNOSIS — M25531 Pain in right wrist: Secondary | ICD-10-CM | POA: Diagnosis not present

## 2019-10-25 DIAGNOSIS — J301 Allergic rhinitis due to pollen: Secondary | ICD-10-CM

## 2019-10-25 DIAGNOSIS — M19042 Primary osteoarthritis, left hand: Secondary | ICD-10-CM

## 2019-10-25 DIAGNOSIS — G8929 Other chronic pain: Secondary | ICD-10-CM

## 2019-10-25 DIAGNOSIS — J309 Allergic rhinitis, unspecified: Secondary | ICD-10-CM

## 2019-10-25 DIAGNOSIS — M25532 Pain in left wrist: Secondary | ICD-10-CM

## 2019-10-25 DIAGNOSIS — M25542 Pain in joints of left hand: Secondary | ICD-10-CM | POA: Diagnosis not present

## 2019-10-25 DIAGNOSIS — R69 Illness, unspecified: Secondary | ICD-10-CM | POA: Diagnosis not present

## 2019-10-25 MED ORDER — PREDNISONE 10 MG PO TABS: ORAL_TABLET | ORAL | 0 refills | Status: DC

## 2019-10-25 MED ORDER — "TUBERCULIN-ALLERGY SYRINGES 28G X 1/2"" 1 ML MISC": 1.0000 | 3 refills | Status: DC

## 2019-10-25 NOTE — Progress Notes (Signed)
[facility] [address] [place], [place] [address]  Internal MEDICINE  Office Visit Note  Patient Name: Susan [name]  [id number]  [id number]  Date of Service: 11/02/2019  Chief Complaint  Patient presents with  . Hypothyroidism  . Hyperlipidemia  . Gastroesophageal Reflux  . Hand Problem    still having pain in her hands     Ms. Sandin presents for a follow-up. She has a hx of carpal tunnel and Sjogren's and has been having severe pain and swelling in both hands. She has been taking a prednisone taper for the last 8 days. She reports improvement in the swelling in her hands, but states that the pain remains severe, rating it 8/10. She also reports occasional numbness and tingling that extends to her forearm. She states that the pain keeps her awake or wakes her from sleep. She is sleeping on the couch because it is easier for her to keep her hands immobile. She also uses carpal tunnel braces for immobilization, which is helpful. She has also taken Norco once for the pain. She states that it was helpful, but that she only took it as a last resort. She reports is unable to complete many ADL's independently because of the pain.      Current Medication: Outpatient Encounter Medications as of 10/25/2019  Medication Sig  . acetaminophen (TYLENOL) 500 MG tablet Take by mouth.  Marland Kitchen albuterol (PROVENTIL HFA;VENTOLIN HFA) 108 (90 Base) MCG/ACT inhaler Inhale 2 puffs into the lungs every 6 (six) hours as needed for wheezing or shortness of breath.  Marland Kitchen albuterol (PROVENTIL) (2.5 MG/3ML) 0.083% nebulizer solution Take 3 mLs (2.5 mg total) by nebulization every 6 (six) hours as needed for wheezing or shortness of breath.  . ALPRAZolam (XANAX) 0.5 MG tablet Take 1 tablet (0.5 mg total) by mouth 3 (three) times daily as needed for sleep or anxiety.  Marland Kitchen amitriptyline (ELAVIL) 25 MG tablet Take 1 tablet (25 mg total) by mouth at bedtime.  . carisoprodol (SOMA) 350 MG tablet Take 350 mg by  mouth at bedtime. Patient states that she takes 1/2 tablet nightly  . Dentifrices (BIOTENE DRY MOUTH DT) by Transmucosal route.  . eletriptan (RELPAX) 40 MG tablet Take 40 mg by mouth as needed for migraine. may repeat in 2 hours if necessary   . EPINEPHRINE 0.3 mg/0.3 mL IJ SOAJ injection USE AS DIRECTED  . estradiol (CLIMARA - DOSED IN MG/24 HR) 0.1 mg/24hr patch Place 1 patch (0.1 mg total) onto the skin once a week.  . ezetimibe (ZETIA) 10 MG tablet Take 1 tablet (10 mg total) by mouth daily.  . famotidine (PEPCID) 10 MG tablet Take 20 mg by mouth.  . fenofibrate (TRICOR) 145 MG tablet Take 1 tablet (145 mg total) by mouth daily.  . fluticasone (FLONASE) 50 MCG/ACT nasal spray Place 2 sprays into both nostrils daily.  . fluticasone furoate-vilanterol (BREO ELLIPTA) 100-25 MCG/INH AEPB Inhale 1 puff into the lungs daily.  Marland Kitchen gabapentin (NEURONTIN) 600 MG tablet Take 1/2 (one-half) tablet by mouth twice daily  . HYDROcodone-acetaminophen (NORCO) 7.5-325 MG tablet Take 1-2 tablets by mouth every 6 (six) hours as needed for moderate pain.  . hydroxychloroquine (PLAQUENIL) 200 MG tablet Take by mouth 2 (two) times daily.  . hyoscyamine (NULEV) 0.125 MG TBDP Place 0.125 mg under the tongue every 4 (four) hours as needed for bladder spasms.   . Lactobacillus (DIGESTIVE HEALTH PROBIOTIC PO) Take 1 tablet by mouth daily.  . Levothyroxine Sodium 137 MCG  CAPS Take 1 capsule (137 mcg total) by mouth daily before breakfast.  . montelukast (SINGULAIR) 10 MG tablet TAKE ONE TABLET BY MOUTH ONCE DAILY FOR ASTHMA  . mupirocin ointment (BACTROBAN) 2 %   . pantoprazole (PROTONIX) 40 MG tablet Take 40 mg by mouth daily.  Vladimir Faster Glycol-Propyl Glycol 0.4-0.3 % SOLN Apply to eye.  . predniSONE (DELTASONE) 10 MG tablet 12 day taper.  64m on days 1 and 2, 533mon days 3 and 4, 408mn days 5 and 6, 30m73m days 7 and 8, 20mg38mdays 9 and 10, 10 mg on days 11 and 12.  . PREMarland KitchenCRIPTION MEDICATION 1 Syringe by  Subdermal route once a week. Allergy shot  . sodium chloride (OCEAN) 0.65 % SOLN nasal spray Place 1 spray into both nostrils as needed for congestion.  . Tuberculin-Allergy Syringes 28G X 1/2" 1 ML MISC 1 each by Does not apply route every 14 (fourteen) days. For allergy injections  . Vaginal Lubricant (REPLENS) GEL Place vaginally.  . venlafaxine XR (EFFEXOR-XR) 150 MG 24 hr capsule Take 2 capsules (300 mg total) by mouth at bedtime.  . Vitamin D, Ergocalciferol, (DRISDOL) 1.25 MG (50000 UT) CAPS capsule TAKE 1 CAPSULE (50,000 UNITS TOTAL) BY MOUTH EVERY 7 (SEVEN) DAYS. MONDAY  . vitamin E 1000 UNIT capsule Take by mouth.  . [DISCONTINUED] predniSONE (DELTASONE) 10 MG tablet 12 day taper.  60mg 75mays 1 and 2, 50mg o49mys 3 and 4, 40mg on11ms 5 and 6, 30mg on 71m 7 and 8, 20mg on d40m9 and 10, 10 mg on days 11 and 12.  . [DISCONTINUED] Tuberculin-Allergy Syringes 28G X 1/2" 1 ML MISC 1 each by Does not apply route every 14 (fourteen) days. For allergy injections   No facility-administered encounter medications on file as of 10/25/2019.    Surgical History: Past Surgical History:  Procedure Laterality Date  . ABDOMINAL HYSTERECTOMY  2006  . BLADDER REPAIR    . BREAST CYST ASPIRATION Left yrs ago  . CHOLECYSTECTOMY  1989  . COLONOSCOPY WITH ESOPHAGOGASTRODUODENOSCOPY (EGD)    . COLONOSCOPY WITH PROPOFOL N/A 08/03/2017   Procedure: COLONOSCOPY WITH PROPOFOL;  Surgeon: Skulskie, Lollie Sailsation: [facility];  Service: Endoscopy;  Laterality: N/A;  . COLONOSCOPY WITH PROPOFOL N/A 10/18/2018   Procedure: COLONOSCOPY WITH PROPOFOL;  Surgeon: Skulskie, Lollie Sailsation[facility];  Service: Endoscopy;  Laterality: N/A;  . ESOPHAGOGASTRODUODENOSCOPY N/A 10/07/2013   Procedure: ESOPHAGOGASTRODUODENOSCOPY (EGD);  Surgeon: Sandi L FiDanie Binderation: [facility];  Service: Endoscopy;  Laterality: N/A;  patient received heparin at 530am given phenergan 25mg IV 3063mnutes before  . EYE SURGERY     lacrimal gland   . FLEXIBLE BRONCHOSCOPY N/A 06/04/2016   Procedure: FLEXIBLE BRONCHOSCOPY;  Surgeon: Saadat A KhAllyne Geetion: [facility];  Service: Pulmonary;  Laterality: N/A;  . INCONTINENCE SURGERY  2004  . NASAL SINUS SURGERY    . THYROID LOBECTOMY    . TOTAL THYROIDECTOMY      Medical History: Past Medical History:  Diagnosis Date  . Arthritis   . Asthma   . Asthmatic bronchitis   . BRCA negative 05/2014   MyRisk neg  . Celiac disease   . Celiac sprue   . Chronic anxiety   . Complication of anesthesia    vomiting  . COPD (chronic obstructive pulmonary disease) (HCC)   . DeNorwalkssion   . Environmental allergies   . Family  history of breast cancer 05/2014   MyRisk neg; IBIS=29.5%  . Fibromyalgia    neuropathy all over  . Gastritis 10/08/2013  . GERD (gastroesophageal reflux disease)   . Hyperlipemia   . Hyperlipidemia    Intolerant to statins  . Hypothyroidism (acquired)   . Increased risk of breast cancer 05/2014   IBIS=29.5%  . Increased risk of breast cancer 05/2014   IBIS=29.5%  . Migraine   . Migraines   . 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 Pancreatitis   . Sjogren's disease ([place])   . Spastic colon   . Thyroid cancer ([place]) 1990 and 1994   Total thyroidectomy with radioactive iodine.     Family History: Family History  Problem Relation Age of Onset  . Arthritis/Rheumatoid Mother        died in age 52s  . Breast cancer Mother 68  . Breast cancer Maternal Grandmother        40's  . Breast cancer Maternal Aunt        X 2. 50's  . Lung cancer Maternal Aunt   . Pancreatitis Neg Hx   . Colon cancer Neg Hx     Social History   Socioeconomic History  . Marital status: Married    Spouse name: Not on file  . Number of children: 0  . Years of education: Not on file  . Highest education level: Not on file  Occupational History  . Not on file  Tobacco Use  . Smoking status: Never Smoker  . Smokeless tobacco:  Never Used  Substance and Sexual Activity  . Alcohol use: No  . Drug use: No  . Sexual activity: Yes    Birth control/protection: Surgical  Other Topics Concern  . Not on file  Social History Narrative  . Not on file   Social Determinants of Health   Financial Resource Strain:   . Difficulty of Paying Living Expenses: Not on file  Food Insecurity:   . Worried About Charity fundraiser in the Last Year: Not on file  . Ran Out of Food in the Last Year: Not on file  Transportation Needs:   . Lack of Transportation (Medical): Not on file  . Lack of Transportation (Non-Medical): Not on file  Physical Activity:   . Days of Exercise per Week: Not on file  . Minutes of Exercise per Session: Not on file  Stress:   . Feeling of Stress : Not on file  Social Connections:   . Frequency of Communication with Friends and Family: Not on file  . Frequency of Social Gatherings with Friends and Family: Not on file  . Attends Religious Services: Not on file  . Active Member of Clubs or Organizations: Not on file  . Attends Archivist Meetings: Not on file  . Marital Status: Not on file  Intimate Partner Violence:   . Fear of Current or Ex-Partner: Not on file  . Emotionally Abused: Not on file  . Physically Abused: Not on file  . Sexually Abused: Not on file      Review of Systems  Constitutional: Positive for fatigue. Negative for chills and unexpected weight change.  HENT: Negative for congestion, nosebleeds, rhinorrhea, sneezing and sore throat.   Respiratory: Negative for cough, chest tightness and shortness of breath.   Cardiovascular: Negative for chest pain and palpitations.  Gastrointestinal: Negative for abdominal pain, constipation, diarrhea, nausea and vomiting.  Endocrine: Negative for cold intolerance, heat intolerance, polydipsia and polyuria.  Genitourinary: Negative  for difficulty urinating.  Musculoskeletal: Positive for myalgias. Negative for arthralgias,  back pain, joint swelling and neck pain.       Bilateral hand pain and swelling. This is limiting her activities of daily living as well as ability to sleep well during the night.   Skin: Negative for rash.  Allergic/Immunologic: Negative.   Neurological: Negative for dizziness, tremors, numbness and headaches.  Hematological: Negative for adenopathy. Does not bruise/bleed easily.  Psychiatric/Behavioral: Negative for behavioral problems and sleep disturbance. The patient is nervous/anxious.     Today's Vitals   10/25/19 1544  BP: 124/81  Pulse: 81  Resp: 16  SpO2: 97%  Weight: 191 lb 3.2 oz (86.7 kg)  Height: 5' 5" (1.651 m)   Body mass index is 31.82 kg/m.  Physical Exam Vitals and nursing note reviewed.  Constitutional:      General: She is not in acute distress.    Appearance: Normal appearance. She is well-developed. She is not diaphoretic.  HENT:     Head: Normocephalic and atraumatic.     Mouth/Throat:     Pharynx: No oropharyngeal exudate.  Eyes:     Pupils: Pupils are equal, round, and reactive to light.  Neck:     Thyroid: No thyromegaly.     Vascular: No JVD.     Trachea: No tracheal deviation.  Cardiovascular:     Rate and Rhythm: Normal rate and regular rhythm.     Heart sounds: Normal heart sounds. No murmur. No friction rub. No gallop.   Pulmonary:     Effort: Pulmonary effort is normal. No respiratory distress.     Breath sounds: Normal breath sounds. No wheezing or rales.  Chest:     Chest wall: No tenderness.  Abdominal:     Palpations: Abdomen is soft.  Musculoskeletal:        General: Normal range of motion.     Cervical back: Normal range of motion and neck supple.     Comments: There is joint pain and swelling in the wrists and multiple joints of the fingers. ROM and strength are both limited due to pain and swelling.   Lymphadenopathy:     Cervical: No cervical adenopathy.  Skin:    General: Skin is warm and dry.  Neurological:     Mental  Status: She is alert and oriented to person, place, and time.     Cranial Nerves: No cranial nerve deficit.  Psychiatric:        Behavior: Behavior normal.        Thought Content: Thought content normal.        Judgment: Judgment normal.    Assessment/Plan:  1. Chronic wrist pain, right Complete prednisone taper. Will repeat. X-ray right wrist for further evaluation.  - DG Wrist Complete Right; Future  2. Chronic pain of left wrist X-ray left wrist for further evaluation.  - DG Wrist Complete Left; Future  3. Pain in joints of right hand X-ray right hand for further evaluation.  - DG Hand Complete Right; Future  4. Pain in joint of left hand X-ray left hand for further evaluation.  - DG Hand Complete Left; Future  5. Primary osteoarthritis of both hands Will repeat 12 day prednisone taper after completion of initial dosing. Refer to new rheumatologist for further evaluation and treatment . - predniSONE (DELTASONE) 10 MG tablet; 12 day taper.  53m on days 1 and 2, 587mon days 3 and 4, 4048mn days 5 and 6, 6m4m days  7 and 8, 75m on days 9 and 10, 10 mg on days 11 and 12.  Dispense: 42 tablet; Refill: 0 - Ambulatory referral to Rheumatology  6. Osteoarthritis of both hands, unspecified osteoarthritis type Patient having a difficult time seeing her rheumatologist. Refer to new rheumatologist for further evaluation and treatment . - Ambulatory referral to Rheumatology  7. Allergic rhinitis, unspecified seasonality, unspecified trigger New prescription for TB syringes with needles so patient may continue with home allergy injections.  - Tuberculin-Allergy Syringes 28G X 1/2" 1 ML MISC; 1 each by Does not apply route every 14 (fourteen) days. For allergy injections  Dispense: 12 each; Refill: 3 General Counseling: KAhlayaverbalizes understanding of the findings of todays visit and agrees with plan of treatment. I have discussed any further diagnostic evaluation that may be needed  or ordered today. We also reviewed her medications today. she has been encouraged to call the office with any questions or concerns that should arise related to todays visit.  This patient was seen by HLeretha PolFNP Collaboration with Dr FLavera Guiseas a part of collaborative care agreement  Orders Placed This Encounter  Procedures  . DG Hand Complete Left  . DG Hand Complete Right  . DG Wrist Complete Left  . DG Wrist Complete Right  . Ambulatory referral to Rheumatology    Meds ordered this encounter  Medications  . predniSONE (DELTASONE) 10 MG tablet    Sig: 12 day taper.  693mon days 1 and 2, 5031mn days 3 and 4, 1m34m days 5 and 6, 30mg38mdays 7 and 8, 20mg 66mays 9 and 10, 10 mg on days 11 and 12.    Dispense:  42 tablet    Refill:  0    Patient to start new taper after she finishes current taper.    Order Specific Question:   Supervising Provider    Answer:   KHAN, Lavera Guise][[id number]]berculin-Allergy Syringes 28G X 1/2" 1 ML MISC    Sig: 1 each by Does not apply route every 14 (fourteen) days. For allergy injections    Dispense:  12 each    Refill:  3    Order Specific Question:   Supervising Provider    Answer:   KHAN, Lavera Guise][[id number]]tal time spent: 30 Min24es      Dr Fozia Lavera Guisenal medicine

## 2019-10-26 ENCOUNTER — Ambulatory Visit (HOSPITAL_COMMUNITY)
Admission: RE | Admit: 2019-10-26 | Discharge: 2019-10-26 | Disposition: A | Discharge status: Home / Self Care | Payer: Medicare HMO | Source: Ambulatory Visit | Attending: Nurse Practitioner | Admitting: Nurse Practitioner

## 2019-10-26 ENCOUNTER — Telehealth: Payer: Self-pay

## 2019-10-26 DIAGNOSIS — M25542 Pain in joints of left hand: Secondary | ICD-10-CM

## 2019-10-26 DIAGNOSIS — M19041 Primary osteoarthritis, right hand: Secondary | ICD-10-CM | POA: Diagnosis not present

## 2019-10-26 DIAGNOSIS — M25541 Pain in joints of right hand: Secondary | ICD-10-CM | POA: Insufficient documentation

## 2019-10-26 DIAGNOSIS — G8929 Other chronic pain: Secondary | ICD-10-CM | POA: Insufficient documentation

## 2019-10-26 DIAGNOSIS — M25532 Pain in left wrist: Secondary | ICD-10-CM | POA: Diagnosis present

## 2019-10-26 DIAGNOSIS — M19031 Primary osteoarthritis, right wrist: Secondary | ICD-10-CM | POA: Diagnosis not present

## 2019-10-26 DIAGNOSIS — M25531 Pain in right wrist: Secondary | ICD-10-CM | POA: Diagnosis present

## 2019-10-26 DIAGNOSIS — M19032 Primary osteoarthritis, left wrist: Secondary | ICD-10-CM | POA: Diagnosis not present

## 2019-10-26 DIAGNOSIS — M19042 Primary osteoarthritis, left hand: Secondary | ICD-10-CM | POA: Diagnosis not present

## 2019-10-26 NOTE — Telephone Encounter (Signed)
-----   Message from Ronnell Freshwater, NP sent at 10/26/2019 12:46 PM EST ----- Please let the patient know that x-rays show pretty advanced osteoarthtic changes in the wrists and hands, most severe in the right hand/ we did referral to new rheumatologist yesterday. She can take pain medications as needed and as prescribed. Take  prednisone taper as prescribed. Thanks.

## 2019-10-26 NOTE — Progress Notes (Signed)
Please let the patient know that x-rays show pretty advanced osteoarthtic changes in the wrists and hands, most severe in the right hand/ we did referral to new rheumatologist yesterday. She can take pain medications as needed and as prescribed. Take prednisone taper as prescribed. Thanks.

## 2019-10-26 NOTE — Telephone Encounter (Signed)
Pt was notified.  

## 2019-10-31 ENCOUNTER — Telehealth: Payer: Self-pay

## 2019-10-31 NOTE — Telephone Encounter (Signed)
Confirmed appointment with patient. klh °

## 2019-11-02 ENCOUNTER — Ambulatory Visit: Payer: Medicare HMO | Admitting: Internal Medicine

## 2019-11-02 ENCOUNTER — Telehealth: Payer: Self-pay

## 2019-11-02 ENCOUNTER — Other Ambulatory Visit: Payer: Self-pay

## 2019-11-02 DIAGNOSIS — G8929 Other chronic pain: Secondary | ICD-10-CM | POA: Insufficient documentation

## 2019-11-02 DIAGNOSIS — M25531 Pain in right wrist: Secondary | ICD-10-CM | POA: Insufficient documentation

## 2019-11-02 DIAGNOSIS — M25541 Pain in joints of right hand: Secondary | ICD-10-CM | POA: Insufficient documentation

## 2019-11-02 DIAGNOSIS — R0602 Shortness of breath: Secondary | ICD-10-CM

## 2019-11-02 DIAGNOSIS — M25542 Pain in joints of left hand: Secondary | ICD-10-CM | POA: Insufficient documentation

## 2019-11-02 DIAGNOSIS — M19041 Primary osteoarthritis, right hand: Secondary | ICD-10-CM | POA: Insufficient documentation

## 2019-11-02 DIAGNOSIS — M19042 Primary osteoarthritis, left hand: Secondary | ICD-10-CM | POA: Insufficient documentation

## 2019-11-02 LAB — PULMONARY FUNCTION TEST

## 2019-11-02 NOTE — Telephone Encounter (Signed)
Confirmed appointment with patient. klh °

## 2019-11-04 ENCOUNTER — Other Ambulatory Visit: Payer: Medicare HMO

## 2019-11-09 NOTE — Procedures (Signed)
[facility] [place], [address]  DATE OF SERVICE: November 02, 2019  Complete Pulmonary Function Testing Interpretation:  FINDINGS:  The forced vital capacity is normal.  FEV1 is normal.  FEV1 FVC ratio is normal.  Postbronchodilator no significant change in the FEV1 clinical improvement may occur in the absence of spirometric improvement.  Total lung capacity is normal residual volume is normal residual in total lung capacity ratio is increased FRC is decreased.  DLCO not performed patient was not able to perform  IMPRESSION:  The spirometry is within normal limits the lung volumes are within normal limits.  Patient was not able to perform DLCO.  Allyne Gee, MD [facility] Pulmonary Critical Care Medicine Sleep Medicine

## 2019-11-23 ENCOUNTER — Ambulatory Visit: Payer: Medicare HMO | Admitting: Internal Medicine

## 2019-12-22 NOTE — Progress Notes (Deleted)
BH MD/PA/NP OP Progress Note  12/22/2019 2:13 PM Susan [name]  MRN:  [id number]  Chief Complaint:  HPI:  - xanax was uptitrated by Dr. Toy Care to 0.5 mg TID Hard copy of xanax  Visit Diagnosis: No diagnosis found.  Past Psychiatric History:  Please see initial evaluation for full details. I have reviewed the history. No updates at this time.     Past Medical History:  Past Medical History:  Diagnosis Date  . Arthritis   . Asthma   . Asthmatic bronchitis   . BRCA negative 05/2014   MyRisk neg  . Celiac disease   . Celiac sprue   . Chronic anxiety   . Complication of anesthesia    vomiting  . COPD (chronic obstructive pulmonary disease) ([place])   . Depression   . Environmental allergies   . Family history of breast cancer 05/2014   MyRisk neg; IBIS=29.5%  . Fibromyalgia    neuropathy all over  . Gastritis 10/08/2013  . GERD (gastroesophageal reflux disease)   . Hyperlipemia   . Hyperlipidemia    Intolerant to statins  . Hypothyroidism (acquired)   . Increased risk of breast cancer 05/2014   IBIS=29.5%  . Increased risk of breast cancer 05/2014   IBIS=29.5%  . Migraine   . Migraines   . 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 Pancreatitis   . Sjogren's disease ([place])   . Spastic colon   . Thyroid cancer ([place]) 1990 and 1994   Total thyroidectomy with radioactive iodine.     Past Surgical History:  Procedure Laterality Date  . ABDOMINAL HYSTERECTOMY  2006  . BLADDER REPAIR    . BREAST CYST ASPIRATION Left yrs ago  . CHOLECYSTECTOMY  1989  . COLONOSCOPY WITH ESOPHAGOGASTRODUODENOSCOPY (EGD)    . COLONOSCOPY WITH PROPOFOL N/A 08/03/2017   Procedure: COLONOSCOPY WITH PROPOFOL;  Surgeon: Lollie Sails, MD;  Location: [facility] ENDOSCOPY;  Service: Endoscopy;  Laterality: N/A;  . COLONOSCOPY WITH PROPOFOL N/A 10/18/2018   Procedure: COLONOSCOPY WITH PROPOFOL;  Surgeon: Lollie Sails, MD;  Location: [facility] ENDOSCOPY;  Service: Endoscopy;  Laterality: N/A;  .  ESOPHAGOGASTRODUODENOSCOPY N/A 10/07/2013   Procedure: ESOPHAGOGASTRODUODENOSCOPY (EGD);  Surgeon: Danie Binder, MD;  Location: [facility] ENDO SUITE;  Service: Endoscopy;  Laterality: N/A;  patient received heparin at 530am given phenergan 62m IV 30 minutes before  . EYE SURGERY     lacrimal gland   . FLEXIBLE BRONCHOSCOPY N/A 06/04/2016   Procedure: FLEXIBLE BRONCHOSCOPY;  Surgeon: SAllyne Gee MD;  Location: [facility] ORS;  Service: Pulmonary;  Laterality: N/A;  . INCONTINENCE SURGERY  2004  . NASAL SINUS SURGERY    . THYROID LOBECTOMY    . TOTAL THYROIDECTOMY      Family Psychiatric History: Please see initial evaluation for full details. I have reviewed the history. No updates at this time.     Family History:  Family History  Problem Relation Age of Onset  . Arthritis/Rheumatoid Mother        died in age 8625s . Breast cancer Mother 426 . Breast cancer Maternal Grandmother        40's  . Breast cancer Maternal Aunt        X 2. 50's  . Lung cancer Maternal Aunt   . Pancreatitis Neg Hx   . Colon cancer Neg Hx     Social History:  Social History   Socioeconomic History  . Marital status: Married    Spouse name: Not on file  .  Number of children: 0  . Years of education: Not on file  . Highest education level: Not on file  Occupational History  . Not on file  Tobacco Use  . Smoking status: Never Smoker  . Smokeless tobacco: Never Used  Substance and Sexual Activity  . Alcohol use: No  . Drug use: No  . Sexual activity: Yes    Birth control/protection: Surgical  Other Topics Concern  . Not on file  Social History Narrative  . Not on file   Social Determinants of Health   Financial Resource Strain:   . Difficulty of Paying Living Expenses: Not on file  Food Insecurity:   . Worried About Charity fundraiser in the Last Year: Not on file  . Ran Out of Food in the Last Year: Not on file  Transportation Needs:   . Lack of Transportation (Medical): Not on file  . Lack  of Transportation (Non-Medical): Not on file  Physical Activity:   . Days of Exercise per Week: Not on file  . Minutes of Exercise per Session: Not on file  Stress:   . Feeling of Stress : Not on file  Social Connections:   . Frequency of Communication with Friends and Family: Not on file  . Frequency of Social Gatherings with Friends and Family: Not on file  . Attends Religious Services: Not on file  . Active Member of Clubs or Organizations: Not on file  . Attends Archivist Meetings: Not on file  . Marital Status: Not on file    Allergies:  Allergies  Allergen Reactions  . Aspirin   . Keflex [Cephalexin] Shortness Of Breath  . Azithromycin Hives and Nausea Only  . Barley Grass   . Clarithromycin Other (See Comments)  . Metoclopramide Other (See Comments)    TREMORS Tremors Tremors   . Oat Other (See Comments)  . Oatmeal Other (See Comments)  . Other     Other reaction(s): Other (See Comments) KEOPLEX.  Marland Kitchen Rye Grass Flower Pollen Extract [Gramineae Pollens]   . Vioxx [Rofecoxib] Diarrhea  . Wheat Bran Other (See Comments)    Celiac disease  . Wheat Extract   . Celebrex [Celecoxib] Hives  . Metoclopramide Hcl Anxiety    BODY TREMORS    Metabolic Disorder Labs: No results found for: HGBA1C, MPG No results found for: PROLACTIN Lab Results  Component Value Date   CHOL 189 03/03/2019   TRIG 121 03/03/2019   HDL 53 03/03/2019   LDLCALC 112 (H) 03/03/2019   Lab Results  Component Value Date   TSH 0.621 03/03/2019   TSH 0.962 10/07/2013    Therapeutic Level Labs: No results found for: LITHIUM No results found for: VALPROATE No components found for:  CBMZ  Current Medications: Current Outpatient Medications  Medication Sig Dispense Refill  . acetaminophen (TYLENOL) 500 MG tablet Take by mouth.    Marland Kitchen albuterol (PROVENTIL HFA;VENTOLIN HFA) 108 (90 Base) MCG/ACT inhaler Inhale 2 puffs into the lungs every 6 (six) hours as needed for wheezing or  shortness of breath. 1 Inhaler 2  . albuterol (PROVENTIL) (2.5 MG/3ML) 0.083% nebulizer solution Take 3 mLs (2.5 mg total) by nebulization every 6 (six) hours as needed for wheezing or shortness of breath. 75 mL 6  . ALPRAZolam (XANAX) 0.5 MG tablet Take 1 tablet (0.5 mg total) by mouth 3 (three) times daily as needed for sleep or anxiety. 90 tablet 0  . amitriptyline (ELAVIL) 25 MG tablet Take 1 tablet (25 mg  total) by mouth at bedtime. 90 tablet 1  . carisoprodol (SOMA) 350 MG tablet Take 350 mg by mouth at bedtime. Patient states that she takes 1/2 tablet nightly    . Dentifrices (BIOTENE DRY MOUTH DT) by Transmucosal route.    . eletriptan (RELPAX) 40 MG tablet Take 40 mg by mouth as needed for migraine. may repeat in 2 hours if necessary     . EPINEPHRINE 0.3 mg/0.3 mL IJ SOAJ injection USE AS DIRECTED 1 Device 2  . estradiol (CLIMARA - DOSED IN MG/24 HR) 0.1 mg/24hr patch Place 1 patch (0.1 mg total) onto the skin once a week. 4 patch 9  . ezetimibe (ZETIA) 10 MG tablet Take 1 tablet (10 mg total) by mouth daily. 90 tablet 1  . famotidine (PEPCID) 10 MG tablet Take 20 mg by mouth.    . fenofibrate (TRICOR) 145 MG tablet Take 1 tablet (145 mg total) by mouth daily. 90 tablet 1  . fluticasone (FLONASE) 50 MCG/ACT nasal spray Place 2 sprays into both nostrils daily. 16 g 3  . fluticasone furoate-vilanterol (BREO ELLIPTA) 100-25 MCG/INH AEPB Inhale 1 puff into the lungs daily. 60 each 2  . gabapentin (NEURONTIN) 600 MG tablet Take 1/2 (one-half) tablet by mouth twice daily 45 tablet 3  . HYDROcodone-acetaminophen (NORCO) 7.5-325 MG tablet Take 1-2 tablets by mouth every 6 (six) hours as needed for moderate pain. 15 tablet 0  . hydroxychloroquine (PLAQUENIL) 200 MG tablet Take by mouth 2 (two) times daily.    . hyoscyamine (NULEV) 0.125 MG TBDP Place 0.125 mg under the tongue every 4 (four) hours as needed for bladder spasms.     . Lactobacillus (DIGESTIVE HEALTH PROBIOTIC PO) Take 1 tablet by  mouth daily.    . Levothyroxine Sodium 137 MCG CAPS Take 1 capsule (137 mcg total) by mouth daily before breakfast. 90 capsule 1  . montelukast (SINGULAIR) 10 MG tablet TAKE ONE TABLET BY MOUTH ONCE DAILY FOR ASTHMA 90 tablet 3  . mupirocin ointment (BACTROBAN) 2 %   0  . pantoprazole (PROTONIX) 40 MG tablet Take 40 mg by mouth daily.    Vladimir Faster Glycol-Propyl Glycol 0.4-0.3 % SOLN Apply to eye.    . predniSONE (DELTASONE) 10 MG tablet 12 day taper.  60m on days 1 and 2, 520mon days 3 and 4, 4067mn days 5 and 6, 2m71m days 7 and 8, 20mg20mdays 9 and 10, 10 mg on days 11 and 12. 42 tablet 0  . PRESCRIPTION MEDICATION 1 Syringe by Subdermal route once a week. Allergy shot    . sodium chloride (OCEAN) 0.65 % SOLN nasal spray Place 1 spray into both nostrils as needed for congestion.    . Tuberculin-Allergy Syringes 28G X 1/2" 1 ML MISC 1 each by Does not apply route every 14 (fourteen) days. For allergy injections 12 each 3  . Vaginal Lubricant (REPLENS) GEL Place vaginally.    . venlafaxine XR (EFFEXOR-XR) 150 MG 24 hr capsule Take 2 capsules (300 mg total) by mouth at bedtime. 180 capsule 1  . Vitamin D, Ergocalciferol, (DRISDOL) 1.25 MG (50000 UT) CAPS capsule TAKE 1 CAPSULE (50,000 UNITS TOTAL) BY MOUTH EVERY 7 (SEVEN) DAYS. MONDAY 4 capsule 2  . vitamin E 1000 UNIT capsule Take by mouth.     No current facility-administered medications for this visit.     Musculoskeletal: Strength & Muscle Tone: N/A Gait & Station: N/A Patient leans: N/A  Psychiatric Specialty Exam: Review of Systems  There were no vitals taken for this visit.There is no height or weight on file to calculate BMI.  General Appearance: {Appearance:22683}  Eye Contact:  {BHH EYE CONTACT:22684}  Speech:  Clear and Coherent  Volume:  Normal  Mood:  {BHH MOOD:22306}  Affect:  {Affect (PAA):22687}  Thought Process:  Coherent  Orientation:  Full (Time, Place, and Person)  Thought Content: Logical   Suicidal  Thoughts:  {ST/HT (PAA):22692}  Homicidal Thoughts:  {ST/HT (PAA):22692}  Memory:  Immediate;   Good  Judgement:  {Judgement (PAA):22694}  Insight:  {Insight (PAA):22695}  Psychomotor Activity:  Normal  Concentration:  Concentration: Good and Attention Span: Good  Recall:  Good  Fund of Knowledge: Good  Language: Good  Akathisia:  No  Handed:  Right  AIMS (if indicated): not done  Assets:  Communication Skills Desire for Improvement  ADL's:  Intact  Cognition: WNL  Sleep:  {BHH GOOD/FAIR/POOR:22877}   Screenings: Mini-Mental     Clinical Support from 03/22/2019 in [facility], [facility]  Total Score (max 30 points )  30    PHQ2-9     Office Visit from 10/14/2019 in [facility], [facility] Office Visit from 07/14/2019 in [facility], [place] from 03/22/2019 in [facility], [facility] Office Visit from 02[facility]07/2019 in [facility], [facility] Office Visit from 09/07/2018 in [facility], [facility]  PHQ-2 Total Score  0  0  0  0  0       Assessment and Plan:  Susan [name] is a 64 y.o. year old female with a history of anxiety, bilateral osteoarthritis, COPD, s/p thyroidectomy, hypothyroidism, history ofCeliac Disease, GERD, Sjogren's syndrome, who presents for follow up appointment for No diagnosis found.  # GAD # Social anxiety  # MDD, recurrent in partial remission  Exam is notable for euthymic affect and patient is well engaged in the interview.  She reports occasional social anxiety, it has been relatively well managed since her last visit.  Psychosocial stressors includes loss of her parents a few years ago, and medical condition includes pain.  Will continue venlafaxine to target anxiety and depression.  Will continue amitriptyline for depression, anxiety and pain.  Will continue Xanax as needed for anxiety; discussed risk of dependence and oversedation.  Will not plan to uptitrate the dose at this time to avoid any  potential side effect. Discussed behavioral activation. She has an upcoming appointment with a therapist.   # Memory loss She complains of short-term memory loss since she was admitted to psychiatry unit for depression in 1999. IADL independent. Will consider MOCA as indicated in the future.   Plan  1. Continue venlafaxine 300 mg daily  2. Continueamitriptyline25 mg at night (anticholinergic side effect from higher dose) 3. Continue Xanax 0.5 mg twice a day as needed for anxiety(will allow her to have sooner refill given she will go to [place] from [place]).  4. Return to clinic in December for 20 mins 5. Referred to therapy - oncarisoprodol (Soma), gabapentin 300 mg BID  Past trials of medication:sertraline, Paxil, venlafaxine, Xanax, Ritalin (worked very well)  The patient demonstrates the following risk factors for suicide: Chronic risk factors for suicide include:psychiatric disorder ofanxiety. Acute risk factorsfor suicide include: N/A. Protective factorsfor this patient include: positive social support, responsibility to others (children, family), coping skills and hope for the future. Considering these factors, the overall suicide risk at this point appears to below.She owns a gun.Patientisappropriate for outpatient follow up.  [name], MD  12/22/2019, 2:13 PM

## 2020-01-03 ENCOUNTER — Encounter (HOSPITAL_COMMUNITY): Payer: Medicare HMO | Admitting: Psychiatry

## 2020-01-03 ENCOUNTER — Other Ambulatory Visit: Payer: Self-pay

## 2020-01-03 ENCOUNTER — Other Ambulatory Visit: Payer: Self-pay | Admitting: Internal Medicine

## 2020-01-03 DIAGNOSIS — E559 Vitamin D deficiency, unspecified: Secondary | ICD-10-CM

## 2020-01-03 NOTE — Progress Notes (Signed)
This encounter was created in error - please disregard.  This encounter was created in error - please disregard.

## 2020-01-16 ENCOUNTER — Other Ambulatory Visit: Payer: Self-pay

## 2020-01-16 MED ORDER — FLUTICASONE PROPIONATE 50 MCG/ACT NA SUSP: 2.0000 | Freq: Every day | NASAL | 3 refills | Status: DC

## 2020-01-20 ENCOUNTER — Other Ambulatory Visit: Payer: Medicare HMO

## 2020-01-24 NOTE — Progress Notes (Signed)
Office Visit Note  Patient: Susan [name]             Date of Birth: Jan 01, 1956           MRN: [id number]             PCP: Allyne Gee, MD Referring: Ronnell Freshwater, NP Visit Date: 01/31/2020 Occupation: @GUAROCC @  Subjective:  New Patient (Initial Visit) (OA bil hand pain, bil knee pain, back pain)   History of Present Illness: Susan [name] is a 64 y.o. female seen in consultation per request of her PCP.  According to patient she was diagnosed with Sjogren's about 12 years ago at [facility] rheumatology.  She was placed on Plaquenil and she has been taking Plaquenil since then.  It has helped her sicca symptoms.  She states about 3 years ago she started having pain in her both hands.  She was also experiencing pain in her knee joints and her feet.  She has noticed intermittent swelling in her knee joints.  She went back for a follow-up visit at [facility] rheumatology about 3 years ago at that time she was diagnosed with osteoarthritis.  She states she had positive rheumatoid factor and positive ANA.  She has had bilateral CMC injections by the rheumatologist.  She also has history of fibromyalgia for many years.  She states due to her distance to [facility] she wanted to switch her care to [place].  She denies any history of oral ulcers, nasal ulcers, malar rash, Raynaud's phenomenon, lymphadenopathy, photosensitivity.  There is history of rheumatoid arthritis in her mother.  Activities of Daily Living:  Patient reports morning stiffness for 1 hour.   Patient Reports nocturnal pain.  Difficulty dressing/grooming: Reports Difficulty climbing stairs: Denies Difficulty getting out of chair: Denies Difficulty using hands for taps, buttons, cutlery, and/or writing: Reports  Review of Systems  Constitutional: Positive for fatigue. Negative for night sweats, weight gain and weight loss.  HENT: Positive for mouth dryness. Negative for mouth sores, trouble swallowing, trouble swallowing and nose  dryness.   Eyes: Positive for dryness. Negative for pain, redness and visual disturbance.  Respiratory: Positive for shortness of breath. Negative for cough and difficulty breathing.        History of asthma  Cardiovascular: Negative for chest pain, palpitations, hypertension, irregular heartbeat and swelling in legs/feet.  Gastrointestinal: Negative for blood in stool, constipation and diarrhea.  Endocrine: Negative for heat intolerance, excessive thirst and increased urination.  Genitourinary: Negative for difficulty urinating and vaginal dryness.  Musculoskeletal: Positive for arthralgias, joint pain, joint swelling, morning stiffness and muscle tenderness. Negative for myalgias, muscle weakness and myalgias.  Skin: Negative for color change, rash, hair loss, skin tightness, ulcers and sensitivity to sunlight.  Allergic/Immunologic: Negative for susceptible to infections.  Neurological: Positive for numbness. Negative for dizziness, memory loss, night sweats and weakness.  Hematological: Negative for bruising/bleeding tendency and swollen glands.  Psychiatric/Behavioral: Positive for depressed mood and sleep disturbance. The patient is nervous/anxious.     PMFS History:  Patient Active Problem List   Diagnosis Date Noted  . Chronic wrist pain, right 11/02/2019  . Chronic pain of left wrist 11/02/2019  . Pain in joints of right hand 11/02/2019  . Pain in joint of left hand 11/02/2019  . Primary osteoarthritis of both hands 11/02/2019  . MDD (major depressive disorder), recurrent, in partial remission ([place]) 10/05/2019  . Dysuria 04/03/2019  . Generalized anxiety disorder 02/28/2019  . Chronic pain of both knees 03/01/2018  .  Encounter for general adult medical examination with abnormal findings 03/01/2018  . Primary osteoarthritis of both first carpometacarpal joints 03/01/2018  . Chronic obstructive pulmonary disease, unspecified ([place]) 12/04/2017  . Shortness of breath 12/04/2017  .  Allergic rhinitis, unspecified 12/04/2017  . 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 Vaginal atrophy 09/01/2017  . Chronic right-sided low back pain without sciatica 01/05/2017  . Primary Sjogren's syndrome ([place]) 01/05/2017  . CD (celiac disease) 04/17/2015  . Gastroesophageal reflux disease with esophagitis 04/17/2015  . Hx of Sjogren's disease ([place]) 04/17/2015  . Acute asthma exacerbation 10/14/2013  . Acute respiratory failure ([place]) 10/14/2013  . Gastritis 10/08/2013  . Bradycardia 10/07/2013  . Esophageal spasm 10/06/2013  . Substernal precordial chest pain 10/06/2013  . Chronic anxiety 10/06/2013  . GERD (gastroesophageal reflux disease) 10/06/2013  . Chronic asthma 10/06/2013  . 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 Depression with anxiety 10/06/2013  . Fibromyalgia 10/06/2013  . Hypothyroidism 10/06/2013  . Chronic migraine 10/06/2013    Past Medical History:  Diagnosis Date  . Arthritis   . Asthma   . Asthmatic bronchitis   . BRCA negative 05/2014   MyRisk neg  . Celiac disease   . Celiac sprue   . Chronic anxiety   . Complication of anesthesia    vomiting  . COPD (chronic obstructive pulmonary disease) ([place])   . Depression   . Environmental allergies   . Family history of breast cancer 05/2014   MyRisk neg; IBIS=29.5%  . Fibromyalgia    neuropathy all over  . Gastritis 10/08/2013  . GERD (gastroesophageal reflux disease)   . Hyperlipemia   . Hyperlipidemia    Intolerant to statins  . Hypothyroidism (acquired)   . Increased risk of breast cancer 05/2014   IBIS=29.5%  . Increased risk of breast cancer 05/2014   IBIS=29.5%  . Migraine   . Migraines   . 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 Pancreatitis   . Sjogren's disease ([place])   . Spastic colon   . Thyroid cancer ([place]) 1990 and 1994   Total thyroidectomy with radioactive iodine.     Family History  Problem Relation Age of Onset  . Arthritis/Rheumatoid  Mother        died in age 6s  . Breast cancer Mother 72  . Breast cancer Maternal Grandmother        40's  . Breast cancer Maternal Aunt        X 2. 50's  . Lung cancer Maternal Aunt   . Pancreatitis Neg Hx   . Colon cancer Neg Hx    Past Surgical History:  Procedure Laterality Date  . ABDOMINAL HYSTERECTOMY  2006  . BLADDER REPAIR    . BREAST CYST ASPIRATION Left yrs ago  . CHOLECYSTECTOMY  1989  . COLONOSCOPY WITH ESOPHAGOGASTRODUODENOSCOPY (EGD)    . COLONOSCOPY WITH PROPOFOL N/A 08/03/2017   Procedure: COLONOSCOPY WITH PROPOFOL;  Surgeon: Lollie Sails, MD;  Location: [facility] ENDOSCOPY;  Service: Endoscopy;  Laterality: N/A;  . COLONOSCOPY WITH PROPOFOL N/A 10/18/2018   Procedure: COLONOSCOPY WITH PROPOFOL;  Surgeon: Lollie Sails, MD;  Location: [facility] ENDOSCOPY;  Service: Endoscopy;  Laterality: N/A;  . ESOPHAGOGASTRODUODENOSCOPY N/A 10/07/2013   Procedure: ESOPHAGOGASTRODUODENOSCOPY (EGD);  Surgeon: Danie Binder, MD;  Location: [facility] ENDO SUITE;  Service: Endoscopy;  Laterality: N/A;  patient received heparin at 530am given phenergan 73m IV 30 minutes before  . EYE SURGERY     lacrimal gland   . FLEXIBLE  BRONCHOSCOPY N/A 06/04/2016   Procedure: FLEXIBLE BRONCHOSCOPY;  Surgeon: Allyne Gee, MD;  Location: [facility] ORS;  Service: Pulmonary;  Laterality: N/A;  . INCONTINENCE SURGERY  2004  . NASAL SINUS SURGERY    . THYROID LOBECTOMY    . TOTAL THYROIDECTOMY     Social History   Social History Narrative  . Not on file   Immunization History  Administered Date(s) Administered  . Influenza Inj Mdck Quad Pf 07/22/2018, 07/05/2019  . Influenza-Unspecified 08/17/2017  . Tdap 07/27/2019  . Zoster Recombinat (Shingrix) 08/21/2017, 12/08/2017     Objective: Vital Signs: BP 114/72 (BP Location: Right Arm, Patient Position: Sitting, Cuff Size: Normal)   Pulse 74   Resp 16   Ht 5' 5"  (1.651 m)   Wt 189 lb 9.6 oz (86 kg)   BMI 31.55 kg/m    Physical Exam Vitals and  nursing note reviewed.  Constitutional:      Appearance: She is well-developed.  HENT:     Head: Normocephalic and atraumatic.  Eyes:     Conjunctiva/sclera: Conjunctivae normal.  Cardiovascular:     Rate and Rhythm: Normal rate and regular rhythm.     Heart sounds: Normal heart sounds.  Pulmonary:     Effort: Pulmonary effort is normal.     Breath sounds: Normal breath sounds.  Abdominal:     General: Bowel sounds are normal.     Palpations: Abdomen is soft.  Musculoskeletal:     Cervical back: Normal range of motion.  Lymphadenopathy:     Cervical: No cervical adenopathy.  Skin:    General: Skin is warm and dry.     Capillary Refill: Capillary refill takes less than 2 seconds.  Neurological:     Mental Status: She is alert and oriented to person, place, and time.  Psychiatric:        Behavior: Behavior normal.      Musculoskeletal Exam: C-spine was in good range of motion.  She has some discomfort range of motion of her lumbar spine.  Shoulder joints and elbow joints in good range of motion.  She has bilateral CMC thickening.  PIP and DIP thickening was noted.  No MCP swelling or synovitis was noted.  Hip joints with good range of motion.  She has discomfort range of motion of bilateral knee joints without any warmth swelling or effusion.  She has good range of motion of ankle joints.  She has tenderness across MTPs and PIPs but no synovitis was noted.  CDAI Exam: CDAI Score: -- Patient Global: --; Provider Global: -- Swollen: --; Tender: -- Joint Exam 01/31/2020   No joint exam has been documented for this visit   There is currently no information documented on the homunculus. Go to the Rheumatology activity and complete the homunculus joint exam.  Investigation: No additional findings.  Imaging: No results found.  Recent Labs: Lab Results  Component Value Date   WBC 5.4 03/03/2019   HGB 13.6 03/03/2019   PLT 366 03/03/2019   NA 142 03/03/2019   K 4.6  03/03/2019   CL 105 03/03/2019   CO2 22 03/03/2019   GLUCOSE 92 03/03/2019   BUN 17 03/03/2019   CREATININE 1.01 (H) 03/03/2019   BILITOT 0.3 03/03/2019   ALKPHOS 78 03/03/2019   AST 18 03/03/2019   ALT 21 03/03/2019   PROT 7.5 03/03/2019   ALBUMIN 4.7 03/03/2019   CALCIUM 9.6 03/03/2019   GFRAA 68 03/03/2019    Speciality Comments: No specialty comments available.  Procedures:  No procedures performed Allergies: Aspirin, Keflex [cephalexin], Azithromycin, Barley grass, Clarithromycin, Metoclopramide, Oat, Oatmeal, Other, Rye grass flower pollen extract [gramineae pollens], Vioxx [rofecoxib], Wheat bran, Wheat extract, Celebrex [celecoxib], and Metoclopramide hcl   Assessment / Plan:     Visit Diagnoses: Sjogren's syndrome with other organ involvement ([place]) -patient continues to have sicca symptoms.  She states she had loss of several of her teeth last year.  She was diagnosed with Sjogren's at [facility] about 12 years ago.  She has been on Plaquenil since then.  She is unable to take pilocarpine due to history of asthma.  She has been using some over-the-counter products.  Over-the-counter products were discussed with her.  Consent on Plaquenil was obtained.  We will refill Plaquenil when it is due.  She states she has been getting her eye exam at [facility].  Plan: Urinalysis, Routine w reflex microscopic, Sedimentation rate, Rheumatoid factor, Cyclic citrul peptide antibody, IgG, ANA, RNP Antibody, Anti-Smith antibody, Sjogrens syndrome-A extractable nuclear antibody, Sjogrens syndrome-B extractable nuclear antibody, Anti-DNA antibody, double-stranded, C3 and C4, Beta-2 glycoprotein antibodies, Cardiolipin antibodies, IgG, IgM, IgA, Lupus Anticoagulant Eval w/Reflex  High risk medication use - PLQ 200 mg BID - Plan: CBC with Differential/Platelet, COMPLETE METABOLIC PANEL WITH GFR  Primary osteoarthritis of both hands-she has bilateral PIP DIP and CMC arthritis with no  synovitis.  I also reviewed her x-rays which were consistent with osteoarthritis.  Primary osteoarthritis of both first carpometacarpal joints  Chronic pain of both knees -she complains of pain and discomfort in the bilateral knee joints with intermittent swelling.  No synovitis was noted.  Plan: XR KNEE 3 VIEW RIGHT, XR KNEE 3 VIEW LEFT.  X-ray showed moderate osteoarthritis and chondromalacia patella.  Pain in both feet -she has discomfort in her bilateral feet.  No synovitis was noted.  Plan: XR Foot 2 Views Right, XR Foot 2 Views Left.  X-rays are consistent with osteoarthritis.  Fibromyalgia -complains of pain in all of her muscles.  She was diagnosed with fibromyalgia several years ago.  Plan: CK  Other medical problems are listed as follows:  Other specified hypothyroidism  History of thyroid cancer  CD (celiac disease)  Esophageal spasm  Nutcracker esophagus  History of gastroesophageal reflux (GERD)  Chronic migraine  Chronic obstructive pulmonary disease, unspecified COPD type ([place])  History of asthma  MDD (major depressive disorder), recurrent, in partial remission (HCC)  Generalized anxiety disorder  Family history of rheumatoid arthritis  Orders: Orders Placed This Encounter  Procedures  . XR KNEE 3 VIEW RIGHT  . XR KNEE 3 VIEW LEFT  . XR Foot 2 Views Right  . XR Foot 2 Views Left  . CBC with Differential/Platelet  . COMPLETE METABOLIC PANEL WITH GFR  . Urinalysis, Routine w reflex microscopic  . Sedimentation rate  . CK  . Rheumatoid factor  . Cyclic citrul peptide antibody, IgG  . ANA  . RNP Antibody  . Anti-Smith antibody  . Sjogrens syndrome-A extractable nuclear antibody  . Sjogrens syndrome-B extractable nuclear antibody  . Anti-DNA antibody, double-stranded  . C3 and C4  . Beta-2 glycoprotein antibodies  . Cardiolipin antibodies, IgG, IgM, IgA  . Lupus Anticoagulant Eval w/Reflex   No orders of the defined types were placed in this  encounter.   Face-to-face time spent with patient was 50 minutes. Greater than 50% of time was spent in counseling and coordination of care.  Follow-Up Instructions: Return for Sjogren's, Osteoarthritis.   Bo Merino, MD  Note - This record has been created using Dragon software.  Chart creation errors have been sought, but may not always  have been located. Such creation errors do not reflect on  the standard of medical care.  

## 2020-01-25 ENCOUNTER — Ambulatory Visit: Payer: Medicare HMO | Admitting: Internal Medicine

## 2020-01-26 NOTE — Progress Notes (Signed)
Virtual Visit via Video Note  I connected with MAKAIAH TERWILLIGER on 02/02/20 at  9:00 AM EDT by a video enabled telemedicine application and verified that I am speaking with the correct person using two identifiers.   I discussed the limitations of evaluation and management by telemedicine and the availability of in person appointments. The patient expressed understanding and agreed to proceed.    I discussed the assessment and treatment plan with the patient. The patient was provided an opportunity to ask questions and all were answered. The patient agreed with the plan and demonstrated an understanding of the instructions.   The patient was advised to call back or seek an in-person evaluation if the symptoms worsen or if the condition fails to improve as anticipated.  I provided 15 minutes of non-face-to-face time during this encounter.   [name], MD    [facility] MD/PA/NP OP Progress Note  02/02/2020 9:30 AM MALYAH OHLRICH  MRN:  [id number]  Chief Complaint:  Chief Complaint    Depression; Follow-up     HPI:  - Xanax was uptitrated to 0.5 mg TID by Dr. Toy Care  This is a follow-up appointment for depression and anxiety.  She states that she has been doing very well since up titration of Xanax.  She has been able to sleep better, and feels much better.  She moved back from [place] to [place] last Sunday.  Irys and her husband has been working on the house to clean up.  She has not been able to take a walk due to pain secondary to varicose vein.  However, she has been active in the house.  She denies insomnia.  Although she feels down at times, it has been manageable.  She has good appetite, and has gained 10 pounds.  She has fair concentration.  She has good energy.  She denies SI.  She feels less anxious.  She denies panic attacks.   Wt Readings from Last 3 Encounters:  01/31/20 189 lb 9.6 oz (86 kg)  10/25/19 191 lb 3.2 oz (86.7 kg)  10/14/19 187 lb 9.6 oz (85.1 kg)     Visit Diagnosis:    ICD-10-CM   1. MDD (major depressive disorder), recurrent, in partial remission (HCC)  F33.41 amitriptyline (ELAVIL) 25 MG tablet    venlafaxine XR (EFFEXOR-XR) 150 MG 24 hr capsule  2. Generalized anxiety disorder  F41.1 amitriptyline (ELAVIL) 25 MG tablet    venlafaxine XR (EFFEXOR-XR) 150 MG 24 hr capsule    ALPRAZolam (XANAX) 0.5 MG tablet    Past Psychiatric History:  Please see initial evaluation for full details. I have reviewed the history. No updates at this time.     Past Medical History:  Past Medical History:  Diagnosis Date  . Arthritis   . Asthma   . Asthmatic bronchitis   . BRCA negative 05/2014   MyRisk neg  . Celiac disease   . Celiac sprue   . Chronic anxiety   . Complication of anesthesia    vomiting  . COPD (chronic obstructive pulmonary disease) ([place])   . Depression   . Environmental allergies   . Family history of breast cancer 05/2014   MyRisk neg; IBIS=29.5%  . Fibromyalgia    neuropathy all over  . Gastritis 10/08/2013  . GERD (gastroesophageal reflux disease)   . Hyperlipemia   . Hyperlipidemia    Intolerant to statins  . Hypothyroidism (acquired)   . Increased risk of breast cancer 05/2014   IBIS=29.5%  .  Increased risk of breast cancer 05/2014   IBIS=29.5%  . Migraine   . Migraines   . 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 Pancreatitis   . Sjogren's disease ([place])   . Spastic colon   . Thyroid cancer ([place]) 1990 and 1994   Total thyroidectomy with radioactive iodine.     Past Surgical History:  Procedure Laterality Date  . ABDOMINAL HYSTERECTOMY  2006  . BLADDER REPAIR    . BREAST CYST ASPIRATION Left yrs ago  . CHOLECYSTECTOMY  1989  . COLONOSCOPY WITH ESOPHAGOGASTRODUODENOSCOPY (EGD)    . COLONOSCOPY WITH PROPOFOL N/A 08/03/2017   Procedure: COLONOSCOPY WITH PROPOFOL;  Surgeon: Lollie Sails, MD;  Location[facility] ENDOSCOPY;  Service: Endoscopy;  Laterality: N/A;  . COLONOSCOPY WITH PROPOFOL N/A  10/18/2018   Procedure: COLONOSCOPY WITH PROPOFOL;  Surgeon: Lollie Sails, MD;  [facility] ENDOSCOPY;  Service: Endoscopy;  Laterality: N/A;  . ESOPHAGOGASTRODUODENOSCOPY N/A 10/07/2013   Procedure: ESOPHAGOGASTRODUODENOSCOPY (EGD);  Surgeon: Danie Binder, MD;  Location: [facility] ENDO SUITE;  Service: Endoscopy;  Laterality: N/A;  patient received heparin at 530am given phenergan 54m IV 30 minutes before  . EYE SURGERY     lacrimal gland   . FLEXIBLE BRONCHOSCOPY N/A 06/04/2016   Procedure: FLEXIBLE BRONCHOSCOPY;  Surgeon: SAllyne Gee MD;  Location: [facility] ORS;  Service: Pulmonary;  Laterality: N/A;  . INCONTINENCE SURGERY  2004  . NASAL SINUS SURGERY    . THYROID LOBECTOMY    . TOTAL THYROIDECTOMY      Family Psychiatric History: Please see initial evaluation for full details. I have reviewed the history. No updates at this time.     Family History:  Family History  Problem Relation Age of Onset  . Arthritis/Rheumatoid Mother        died in age 779s . Breast cancer Mother 4109 . Breast cancer Maternal Grandmother        40's  . Breast cancer Maternal Aunt        X 2. 50's  . Lung cancer Maternal Aunt   . Pancreatitis Neg Hx   . Colon cancer Neg Hx     Social History:  Social History   Socioeconomic History  . Marital status: Married    Spouse name: Not on file  . Number of children: 0  . Years of education: Not on file  . Highest education level: Not on file  Occupational History  . Not on file  Tobacco Use  . Smoking status: Never Smoker  . Smokeless tobacco: Never Used  Substance and Sexual Activity  . Alcohol use: No  . Drug use: No  . Sexual activity: Yes    Birth control/protection: Surgical  Other Topics Concern  . Not on file  Social History Narrative  . Not on file   Social Determinants of Health   Financial Resource Strain:   . Difficulty of Paying Living Expenses:   Food Insecurity:   . Worried About RCharity fundraiserin the Last Year:    . RArboriculturistin the Last Year:   Transportation Needs:   . LFilm/video editor(Medical):   .Marland KitchenLack of Transportation (Non-Medical):   Physical Activity:   . Days of Exercise per Week:   . Minutes of Exercise per Session:   Stress:   . Feeling of Stress :   Social Connections:   . Frequency of Communication with Friends and Family:   . Frequency of Social Gatherings with Friends  and Family:   . Attends Religious Services:   . Active Member of Clubs or Organizations:   . Attends Archivist Meetings:   Marland Kitchen Marital Status:     Allergies:  Allergies  Allergen Reactions  . Aspirin   . Keflex [Cephalexin] Shortness Of Breath  . Azithromycin Hives and Nausea Only  . Barley Grass   . Clarithromycin Other (See Comments)  . Metoclopramide Other (See Comments)    TREMORS Tremors Tremors   . Oat Other (See Comments)  . Oatmeal Other (See Comments)  . Other     Other reaction(s): Other (See Comments) KEOPLEX.  Marland Kitchen Rye Grass Flower Pollen Extract [Gramineae Pollens]   . Vioxx [Rofecoxib] Diarrhea  . Wheat Bran Other (See Comments)    Celiac disease  . Wheat Extract   . Celebrex [Celecoxib] Hives  . Metoclopramide Hcl Anxiety    BODY TREMORS    Metabolic Disorder Labs: No results found for: HGBA1C, MPG No results found for: PROLACTIN Lab Results  Component Value Date   CHOL 189 03/03/2019   TRIG 121 03/03/2019   HDL 53 03/03/2019   LDLCALC 112 (H) 03/03/2019   Lab Results  Component Value Date   TSH 0.621 03/03/2019   TSH 0.962 10/07/2013    Therapeutic Level Labs: No results found for: LITHIUM No results found for: VALPROATE No components found for:  CBMZ  Current Medications: Current Outpatient Medications  Medication Sig Dispense Refill  . acetaminophen (TYLENOL) 500 MG tablet Take by mouth.    Marland Kitchen albuterol (PROVENTIL HFA;VENTOLIN HFA) 108 (90 Base) MCG/ACT inhaler Inhale 2 puffs into the lungs every 6 (six) hours as needed for wheezing or  shortness of breath. 1 Inhaler 2  . albuterol (PROVENTIL) (2.5 MG/3ML) 0.083% nebulizer solution Take 3 mLs (2.5 mg total) by nebulization every 6 (six) hours as needed for wheezing or shortness of breath. 75 mL 6  . ALPRAZolam (XANAX) 0.5 MG tablet Take 1 tablet (0.5 mg total) by mouth 3 (three) times daily as needed for anxiety. (Patient not taking: Reported on 01/31/2020) 90 tablet 0  . [START ON 03/04/2020] ALPRAZolam (XANAX) 0.5 MG tablet Take 1 tablet (0.5 mg total) by mouth 3 (three) times daily as needed for sleep or anxiety. 90 tablet 1  . [START ON 03/02/2020] amitriptyline (ELAVIL) 25 MG tablet Take 1 tablet (25 mg total) by mouth at bedtime. 90 tablet 0  . Ascorbic Acid (VITAMIN C) 100 MG tablet Take 100 mg by mouth daily.    . calcium carbonate (OS-CAL) 1250 (500 Ca) MG chewable tablet Chew by mouth.    . carisoprodol (SOMA) 350 MG tablet Take 350 mg by mouth at bedtime. Patient states that she takes 1/2 tablet nightly    . Dentifrices (BIOTENE DRY MOUTH DT) by Transmucosal route.    . eletriptan (RELPAX) 40 MG tablet Take 40 mg by mouth as needed for migraine. may repeat in 2 hours if necessary     . EPINEPHRINE 0.3 mg/0.3 mL IJ SOAJ injection USE AS DIRECTED 1 Device 2  . estradiol (CLIMARA - DOSED IN MG/24 HR) 0.1 mg/24hr patch Place 1 patch (0.1 mg total) onto the skin once a week. (Patient not taking: Reported on 01/31/2020) 4 patch 9  . ezetimibe (ZETIA) 10 MG tablet Take 1 tablet (10 mg total) by mouth daily. 90 tablet 1  . famotidine (PEPCID) 10 MG tablet Take 20 mg by mouth.    . fenofibrate (TRICOR) 145 MG tablet Take 1 tablet (145 mg  total) by mouth daily. 90 tablet 1  . fluticasone (FLONASE) 50 MCG/ACT nasal spray Place 2 sprays into both nostrils daily. 16 g 3  . fluticasone furoate-vilanterol (BREO ELLIPTA) 100-25 MCG/INH AEPB Inhale 1 puff into the lungs daily. 60 each 2  . gabapentin (NEURONTIN) 600 MG tablet Take 1/2 (one-half) tablet by mouth twice daily 45 tablet 3  .  HYDROcodone-acetaminophen (NORCO) 7.5-325 MG tablet Take 1-2 tablets by mouth every 6 (six) hours as needed for moderate pain. 15 tablet 0  . hydroxychloroquine (PLAQUENIL) 200 MG tablet Take by mouth 2 (two) times daily.    . hyoscyamine (NULEV) 0.125 MG TBDP Place 0.125 mg under the tongue every 4 (four) hours as needed for bladder spasms.     . Lactobacillus (DIGESTIVE HEALTH PROBIOTIC PO) Take 1 tablet by mouth daily.    Marland Kitchen levothyroxine (SYNTHROID) 137 MCG tablet Take 137 mcg by mouth every morning.    . Levothyroxine Sodium 137 MCG CAPS Take 1 capsule (137 mcg total) by mouth daily before breakfast. 90 capsule 1  . LINZESS 290 MCG CAPS capsule Take 290 mcg by mouth daily.    . Magnesium Oxide (NATRUL MAGNESIUM PO) Take by mouth.    . montelukast (SINGULAIR) 10 MG tablet TAKE ONE TABLET BY MOUTH ONCE DAILY FOR ASTHMA 90 tablet 3  . Multiple Vitamins-Minerals (HAIR SKIN AND NAILS FORMULA PO) Take by mouth.    . Multiple Vitamins-Minerals (ZINC PO) Take by mouth.    . mupirocin ointment (BACTROBAN) 2 %   0  . pantoprazole (PROTONIX) 40 MG tablet Take 40 mg by mouth daily.    Vladimir Faster Glycol-Propyl Glycol 0.4-0.3 % SOLN Apply to eye.    . predniSONE (DELTASONE) 10 MG tablet 12 day taper.  59m on days 1 and 2, 592mon days 3 and 4, 4053mn days 5 and 6, 13m72m days 7 and 8, 20mg5mdays 9 and 10, 10 mg on days 11 and 12. (Patient not taking: Reported on 01/31/2020) 42 tablet 0  . PRESCRIPTION MEDICATION 1 Syringe by Subdermal route once a week. Allergy shot    . Probiotic Product (PHILLHudson FallsS Take by mouth.    . sodium chloride (OCEAN) 0.65 % SOLN nasal spray Place 1 spray into both nostrils as needed for congestion.    . sodium chloride 0.9 % nebulizer solution     . SUMAtriptan-naproxen (TREXIMET) 85-500 MG tablet     . Tuberculin-Allergy Syringes 28G X 1/2" 1 ML MISC 1 each by Does not apply route every 14 (fourteen) days. For allergy injections 12 each 3  . Vaginal  Lubricant (REPLENS) GEL Place vaginally.    . [STDerrill Memo/04/2020] venlafaxine XR (EFFEXOR-XR) 150 MG 24 hr capsule Take 2 capsules (300 mg total) by mouth at bedtime. 180 capsule 0  . Vitamin D, Ergocalciferol, (DRISDOL) 1.25 MG (50000 UNIT) CAPS capsule TAKE 1 CAPSULE (50,000 UNITS TOTAL) BY MOUTH EVERY 7 (SEVEN) DAYS. MONDAY 12 capsule 1  . vitamin E 1000 UNIT capsule Take by mouth.    . WhiMarland Kitchene Petrolatum-Mineral Oil (WH PEDowningtownOL-MINERAL OIL-LANOLIN) 0.1-0.1 % OINT      No current facility-administered medications for this visit.     Musculoskeletal: Strength & Muscle Tone: N/A Gait & Station: N/A Patient leans: N/A  Psychiatric Specialty Exam: Review of Systems  Psychiatric/Behavioral: Positive for dysphoric mood. Negative for agitation, behavioral problems, confusion, decreased concentration, hallucinations, self-injury, sleep disturbance and suicidal ideas. The patient is nervous/anxious. The patient is not hyperactive.  All other systems reviewed and are negative.   There were no vitals taken for this visit.There is no height or weight on file to calculate BMI.  General Appearance: Fairly Groomed  Eye Contact:  Good  Speech:  Clear and Coherent  Volume:  Normal  Mood:  good  Affect:  Appropriate, Congruent and Full Range  Thought Process:  Coherent  Orientation:  Full (Time, Place, and Person)  Thought Content: Logical   Suicidal Thoughts:  No  Homicidal Thoughts:  No  Memory:  Immediate;   Good  Judgement:  Good  Insight:  Good  Psychomotor Activity:  Normal  Concentration:  Concentration: Good and Attention Span: Good  Recall:  Good  Fund of Knowledge: Good  Language: Good  Akathisia:  No  Handed:  Right  AIMS (if indicated): not done  Assets:  Communication Skills Desire for Improvement  ADL's:  Intact  Cognition: WNL  Sleep:  Good   Screenings: Mini-Mental     Clinical Support from 03/22/2019 in [facility], [facility]  Total Score (max 30 points )  30     PHQ2-9     Office Visit from 10/14/2019 in [facility], [facility] Office Visit from 07/14/2019 in [facility], [place] from 03/22/2019 in [facility], [facility] Office Visit from 02/07/2019 in [facility], [facility] Office Visit from 09/07/2018 in [facility], [facility]  PHQ-2 Total Score  0  0  0  0  0       Assessment and Plan:  JEHIELI BRASSELL is a 64 y.o. year old female with a history of anxiety,  bilateral osteoarthritis, COPD, s/p thyroidectomy, hypothyroidism, history ofCeliac Disease, GERD, Sjogren's syndrome, who presents for follow up appointment for MDD (major depressive disorder), recurrent, in partial remission ([place]) - Plan: amitriptyline (ELAVIL) 25 MG tablet, venlafaxine XR (EFFEXOR-XR) 150 MG 24 hr capsule  Generalized anxiety disorder - Plan: amitriptyline (ELAVIL) 25 MG tablet, venlafaxine XR (EFFEXOR-XR) 150 MG 24 hr capsule, ALPRAZolam (XANAX) 0.5 MG tablet  # GAD # Social anxiety  # MDD, recurrent in partial remission She reports significant improvement in anxiety and insomnia since up titration of Xanax.  Psychosocial stressors includes loss of her parents a few years ago, and her medical condition which includes pain.  Will continue venlafaxine to target anxiety and depression.  We will continue amitriptyline for depression, anxiety and pain.  We will continue Xanax as needed for anxiety.  Discussed risk of dependence, tolerance and fall.  She agrees not to do any more up titration in the future to avoid any potential side effect.  There has been no concerning behavior of misuse of this medication.   # Memory loss She complains of short-term memory loss since she was admitted to psychiatry unit for depression in 1999. IADL independent. Will consider MOCA as indicated in the future.   Plan I have reviewed and updated plans as below 1. Continue venlafaxine 300 mg daily  2. Continueamitriptyline25 mg at night  (anticholinergic side effect from higher dose) 3. Continue Xanax 0.5 mg three times a day as needed for anxiety 4. Return to clinic - 7/1 at 10:20 - oncarisoprodol (Soma), gabapentin 300 mg BID  Past trials of medication:sertraline, Paxil, venlafaxine, Xanax, Ritalin (worked very well)  The patient demonstrates the following risk factors for suicide: Chronic risk factors for suicide include:psychiatric disorder ofanxiety. Acute risk factorsfor suicide include: N/A. Protective factorsfor this patient include: positive social support, responsibility to others (children, family), coping  skills and hope for the future. Considering these factors, the overall suicide risk at this point appears to below.She owns a gun.Patientisappropriate for outpatient follow up.    [name], MD 02/02/2020, 9:30 AM

## 2020-01-30 ENCOUNTER — Ambulatory Visit: Payer: Medicare HMO | Admitting: Internal Medicine

## 2020-01-30 ENCOUNTER — Telehealth (HOSPITAL_COMMUNITY): Payer: Self-pay | Admitting: *Deleted

## 2020-01-30 ENCOUNTER — Other Ambulatory Visit (HOSPITAL_COMMUNITY): Payer: Self-pay | Admitting: Psychiatry

## 2020-01-30 MED ORDER — ALPRAZOLAM 0.5 MG PO TABS: 0.5000 mg | ORAL_TABLET | Freq: Three times a day (TID) | ORAL | 0 refills | Status: DC | PRN

## 2020-01-30 NOTE — Telephone Encounter (Signed)
PATIENT CALLED REFILL REQUEST: ALPRAZolam (XANAX) 0.5 MG tablet  STATED SHE OUT  0 REFILLS ON FILE /HOLD  @ [facility]. NEXT APPT 02/02/20

## 2020-01-30 NOTE — Telephone Encounter (Signed)
ordered

## 2020-01-31 ENCOUNTER — Encounter: Payer: Self-pay | Admitting: Rheumatology

## 2020-01-31 ENCOUNTER — Ambulatory Visit: Payer: Self-pay

## 2020-01-31 ENCOUNTER — Ambulatory Visit: Payer: Medicare HMO | Admitting: Rheumatology

## 2020-01-31 ENCOUNTER — Other Ambulatory Visit: Payer: Self-pay

## 2020-01-31 VITALS — BP 114/72 | HR 74 | Resp 16 | Ht 65.0 in | Wt 189.6 lb

## 2020-01-31 DIAGNOSIS — M25562 Pain in left knee: Secondary | ICD-10-CM | POA: Diagnosis not present

## 2020-01-31 DIAGNOSIS — M25561 Pain in right knee: Secondary | ICD-10-CM

## 2020-01-31 DIAGNOSIS — M19041 Primary osteoarthritis, right hand: Secondary | ICD-10-CM

## 2020-01-31 DIAGNOSIS — Z79899 Other long term (current) drug therapy: Secondary | ICD-10-CM | POA: Diagnosis not present

## 2020-01-31 DIAGNOSIS — R5383 Other fatigue: Secondary | ICD-10-CM | POA: Diagnosis not present

## 2020-01-31 DIAGNOSIS — M797 Fibromyalgia: Secondary | ICD-10-CM

## 2020-01-31 DIAGNOSIS — J449 Chronic obstructive pulmonary disease, unspecified: Secondary | ICD-10-CM

## 2020-01-31 DIAGNOSIS — G8929 Other chronic pain: Secondary | ICD-10-CM | POA: Diagnosis not present

## 2020-01-31 DIAGNOSIS — K224 Dyskinesia of esophagus: Secondary | ICD-10-CM

## 2020-01-31 DIAGNOSIS — K900 Celiac disease: Secondary | ICD-10-CM | POA: Diagnosis not present

## 2020-01-31 DIAGNOSIS — M79671 Pain in right foot: Secondary | ICD-10-CM | POA: Diagnosis not present

## 2020-01-31 DIAGNOSIS — Z8709 Personal history of other diseases of the respiratory system: Secondary | ICD-10-CM

## 2020-01-31 DIAGNOSIS — E038 Other specified hypothyroidism: Secondary | ICD-10-CM

## 2020-01-31 DIAGNOSIS — G43709 Chronic migraine without aura, not intractable, without status migrainosus: Secondary | ICD-10-CM

## 2020-01-31 DIAGNOSIS — M79672 Pain in left foot: Secondary | ICD-10-CM

## 2020-01-31 DIAGNOSIS — M3509 Sicca syndrome with other organ involvement: Secondary | ICD-10-CM

## 2020-01-31 DIAGNOSIS — F3341 Major depressive disorder, recurrent, in partial remission: Secondary | ICD-10-CM

## 2020-01-31 DIAGNOSIS — M19042 Primary osteoarthritis, left hand: Secondary | ICD-10-CM

## 2020-01-31 DIAGNOSIS — Z85850 Personal history of malignant neoplasm of thyroid: Secondary | ICD-10-CM | POA: Diagnosis not present

## 2020-01-31 DIAGNOSIS — IMO0002 Reserved for concepts with insufficient information to code with codable children: Secondary | ICD-10-CM

## 2020-01-31 DIAGNOSIS — Z8719 Personal history of other diseases of the digestive system: Secondary | ICD-10-CM

## 2020-01-31 DIAGNOSIS — M180 Bilateral primary osteoarthritis of first carpometacarpal joints: Secondary | ICD-10-CM

## 2020-01-31 DIAGNOSIS — Z8261 Family history of arthritis: Secondary | ICD-10-CM

## 2020-01-31 DIAGNOSIS — F411 Generalized anxiety disorder: Secondary | ICD-10-CM

## 2020-01-31 NOTE — Progress Notes (Signed)
Pharmacy Note  Subjective: Patient presents today to [facility] Rheumatology for follow up office visit.   Patient seen by the pharmacist for counseling on hydroxychloroquine Sjogren's syndrome.    Objective: CMP     Component Value Date/Time   NA 142 03/03/2019 1040   K 4.6 03/03/2019 1040   CL 105 03/03/2019 1040   CO2 22 03/03/2019 1040   GLUCOSE 92 03/03/2019 1040   GLUCOSE 109 (H) 06/03/2017 0508   BUN 17 03/03/2019 1040   CREATININE 1.01 (H) 03/03/2019 1040   CALCIUM 9.6 03/03/2019 1040   PROT 7.5 03/03/2019 1040   ALBUMIN 4.7 03/03/2019 1040   AST 18 03/03/2019 1040   ALT 21 03/03/2019 1040   ALKPHOS 78 03/03/2019 1040   BILITOT 0.3 03/03/2019 1040   GFRNONAA 59 (L) 03/03/2019 1040   GFRAA 68 03/03/2019 1040    CBC    Component Value Date/Time   WBC 5.4 03/03/2019 1040   WBC 7.7 06/03/2017 0456   RBC 4.71 03/03/2019 1040   RBC 4.58 06/03/2017 0456   HGB 13.6 03/03/2019 1040   HCT 41.2 03/03/2019 1040   PLT 366 03/03/2019 1040   MCV 88 03/03/2019 1040   MCH 28.9 03/03/2019 1040   MCH 29.7 06/03/2017 0456   MCHC 33.0 03/03/2019 1040   MCHC 33.2 06/03/2017 0456   RDW 12.6 03/03/2019 1040   LYMPHSABS 2.0 03/03/2019 1040   MONOABS 0.6 06/03/2017 0456   EOSABS 0.2 03/03/2019 1040   BASOSABS 0.1 03/03/2019 1040    Assessment/Plan: Patient was counseled on the purpose, proper use, and adverse effects of hydroxychloroquine including nausea/diarrhea, skin rash, headaches, and sun sensitivity.  Discussed importance of annual eye exams while on hydroxychloroquine to monitor to ocular toxicity and discussed importance of frequent laboratory monitoring.  Provided patient with eye exam form for baseline ophthalmologic exam and standing lab instructions.  Provided patient with educational materials on hydroxychloroquine and answered all questions.  Patient consented to hydroxychloroquine.  Will upload consent in the media tab.     Dose will be Plaquenil 200 mg twice  daily based off ABW 68 kg and height 5'5" (5.8 mg/kg).  All questions encouraged and answered.  Instructed patient to call with any questions or concerns.  Mariella Saa, PharmD, [place], [place] Clinical Specialty Pharmacist [phone]  01/31/2020 10:40 AM

## 2020-01-31 NOTE — Patient Instructions (Signed)
Standing Labs We placed an order today for your standing lab work.    Please come back and get your standing labs in July and every 5 months  We have open lab daily Monday through Thursday from 8:30-12:30 PM and 1:30-4:30 PM and Friday from 8:30-12:30 PM and 1:30-4:00 PM at the office of Dr. Bo Merino.   You may experience shorter wait times on Monday and Friday afternoons. The office is located at [address] [place], [place], [place] [address] No appointment is necessary.   Labs are drawn by [facility].  You may receive a bill from [place] for your lab work.  If you wish to have your labs drawn at another location, please call the office 24 hours in advance to send orders.  If you have any questions regarding directions or hours of operation,  please call [phone].   Just as a reminder please drink plenty of water prior to coming for your lab work. Thanks!

## 2020-02-01 ENCOUNTER — Telehealth: Payer: Self-pay

## 2020-02-01 NOTE — Telephone Encounter (Signed)
Confirmed appointment on 02/03/2020 and screened for covid.klh

## 2020-02-02 ENCOUNTER — Encounter (HOSPITAL_COMMUNITY): Payer: Self-pay | Admitting: Psychiatry

## 2020-02-02 ENCOUNTER — Other Ambulatory Visit: Payer: Self-pay

## 2020-02-02 ENCOUNTER — Ambulatory Visit (INDEPENDENT_AMBULATORY_CARE_PROVIDER_SITE_OTHER): Payer: Medicare HMO | Admitting: Psychiatry

## 2020-02-02 DIAGNOSIS — F411 Generalized anxiety disorder: Secondary | ICD-10-CM

## 2020-02-02 DIAGNOSIS — R69 Illness, unspecified: Secondary | ICD-10-CM | POA: Diagnosis not present

## 2020-02-02 DIAGNOSIS — F3341 Major depressive disorder, recurrent, in partial remission: Secondary | ICD-10-CM | POA: Diagnosis not present

## 2020-02-02 MED ORDER — VENLAFAXINE HCL ER 150 MG PO CP24: 300.0000 mg | ORAL_CAPSULE | Freq: Every day | ORAL | 0 refills | Status: DC

## 2020-02-02 MED ORDER — AMITRIPTYLINE HCL 25 MG PO TABS: 25.0000 mg | ORAL_TABLET | Freq: Every day | ORAL | 0 refills | Status: DC

## 2020-02-02 MED ORDER — ALPRAZOLAM 0.5 MG PO TABS: 0.5000 mg | ORAL_TABLET | Freq: Three times a day (TID) | ORAL | 1 refills | Status: DC | PRN

## 2020-02-02 NOTE — Progress Notes (Signed)
I will discuss results at the follow-up visit.

## 2020-02-02 NOTE — Patient Instructions (Addendum)
1. Continue venlafaxine 300 mg daily  2. Continueamitriptyline25 mg at night  3. Continue Xanax 0.5 mg three times a day as needed for anxiety 4. Return to clinic - 7/1 at 10:20

## 2020-02-03 ENCOUNTER — Ambulatory Visit: Payer: Medicare HMO

## 2020-02-03 ENCOUNTER — Other Ambulatory Visit: Payer: Self-pay

## 2020-02-03 DIAGNOSIS — R0602 Shortness of breath: Secondary | ICD-10-CM | POA: Diagnosis not present

## 2020-02-04 LAB — LUPUS ANTICOAGULANT EVAL W/ REFLEX
PTT-LA Screen: 34 s (ref <=40)
dRVVT: 39 s (ref <=45)

## 2020-02-04 LAB — CBC WITH DIFFERENTIAL/PLATELET
Absolute Monocytes: 435 cells/uL (ref 200–950)
Basophils Absolute: 50 cells/uL (ref 0–200)
Basophils Relative: 1 %
Eosinophils Absolute: 230 cells/uL (ref 15–500)
Eosinophils Relative: 4.6 %
HCT: 38.4 % (ref 35.0–45.0)
Hemoglobin: 12.6 g/dL (ref 11.7–15.5)
Lymphs Abs: 1815 cells/uL (ref 850–3900)
MCH: 29.2 pg (ref 27.0–33.0)
MCHC: 32.8 g/dL (ref 32.0–36.0)
MCV: 89.1 fL (ref 80.0–100.0)
MPV: 9.5 fL (ref 7.5–12.5)
Monocytes Relative: 8.7 %
Neutro Abs: 2470 cells/uL (ref 1500–7800)
Neutrophils Relative %: 49.4 %
Platelets: 343 10*3/uL (ref 140–400)
RBC: 4.31 10*6/uL (ref 3.80–5.10)
RDW: 12.5 % (ref 11.0–15.0)
Total Lymphocyte: 36.3 %
WBC: 5 10*3/uL (ref 3.8–10.8)

## 2020-02-04 LAB — COMPLETE METABOLIC PANEL WITH GFR
AG Ratio: 1.5 (calc) (ref 1.0–2.5)
ALT: 19 U/L (ref 6–29)
AST: 18 U/L (ref 10–35)
Albumin: 4.3 g/dL (ref 3.6–5.1)
Alkaline phosphatase (APISO): 59 U/L (ref 37–153)
BUN: 13 mg/dL (ref 7–25)
CO2: 25 mmol/L (ref 20–32)
Calcium: 9.7 mg/dL (ref 8.6–10.4)
Chloride: 106 mmol/L (ref 98–110)
Creat: 0.9 mg/dL (ref 0.50–0.99)
GFR, Est African American: 78 mL/min/{1.73_m2} (ref >=60)
GFR, Est Non African American: 68 mL/min/{1.73_m2} (ref >=60)
Globulin: 2.8 g/dL (calc) (ref 1.9–3.7)
Glucose, Bld: 87 mg/dL (ref 65–99)
Potassium: 4.3 mmol/L (ref 3.5–5.3)
Sodium: 139 mmol/L (ref 135–146)
Total Bilirubin: 0.3 mg/dL (ref 0.2–1.2)
Total Protein: 7.1 g/dL (ref 6.1–8.1)

## 2020-02-04 LAB — URINALYSIS, ROUTINE W REFLEX MICROSCOPIC
Bilirubin Urine: NEGATIVE
Glucose, UA: NEGATIVE
Hgb urine dipstick: NEGATIVE
Ketones, ur: NEGATIVE
Leukocytes,Ua: NEGATIVE
Nitrite: NEGATIVE
Protein, ur: NEGATIVE
Specific Gravity, Urine: 1.009 (ref 1.001–1.03)
pH: 7 (ref 5.0–8.0)

## 2020-02-04 LAB — CARDIOLIPIN ANTIBODIES, IGG, IGM, IGA
Anticardiolipin IgA: <11 [APL'U]
Anticardiolipin IgG: <14 [GPL'U]
Anticardiolipin IgM: <12 [MPL'U]

## 2020-02-04 LAB — C3 AND C4
C3 Complement: 171 mg/dL (ref 83–193)
C4 Complement: 28 mg/dL (ref 15–57)

## 2020-02-04 LAB — RNP ANTIBODY: Ribonucleic Protein(ENA) Antibody, IgG: <1 AI

## 2020-02-04 LAB — SJOGRENS SYNDROME-A EXTRACTABLE NUCLEAR ANTIBODY: SSA (Ro) (ENA) Antibody, IgG: <1 AI

## 2020-02-04 LAB — BETA-2 GLYCOPROTEIN ANTIBODIES
Beta-2 Glyco 1 IgA: <9 SAU (ref <=20)
Beta-2 Glyco 1 IgM: 9 SMU (ref <=20)
Beta-2 Glyco I IgG: <9 SGU (ref <=20)

## 2020-02-04 LAB — CYCLIC CITRUL PEPTIDE ANTIBODY, IGG: Cyclic Citrullin Peptide Ab: <16 UNITS

## 2020-02-04 LAB — ANA: Anti Nuclear Antibody (ANA): POSITIVE — AB

## 2020-02-04 LAB — ANTI-NUCLEAR AB-TITER (ANA TITER): ANA Titer 1: 1:320 {titer} — ABNORMAL HIGH

## 2020-02-04 LAB — RHEUMATOID FACTOR: Rheumatoid fact SerPl-aCnc: <14 IU/mL (ref <14)

## 2020-02-04 LAB — ANTI-SMITH ANTIBODY: ENA SM Ab Ser-aCnc: <1 AI

## 2020-02-04 LAB — ANTI-DNA ANTIBODY, DOUBLE-STRANDED: ds DNA Ab: <1 IU/mL

## 2020-02-04 LAB — SEDIMENTATION RATE: Sed Rate: 11 mm/h (ref 0–30)

## 2020-02-04 LAB — SJOGRENS SYNDROME-B EXTRACTABLE NUCLEAR ANTIBODY: SSB (La) (ENA) Antibody, IgG: <1 AI

## 2020-02-04 LAB — CK: Total CK: 44 U/L (ref 29–143)

## 2020-02-10 ENCOUNTER — Telehealth: Payer: Self-pay

## 2020-02-10 NOTE — Telephone Encounter (Signed)
Called lmom informing patient of appointment on 02/13/2020.klh

## 2020-02-13 ENCOUNTER — Telehealth: Payer: Self-pay

## 2020-02-13 NOTE — Telephone Encounter (Signed)
Confirmed appointment on 02/14/2020 and screened for covid. klh

## 2020-02-14 ENCOUNTER — Ambulatory Visit (INDEPENDENT_AMBULATORY_CARE_PROVIDER_SITE_OTHER): Payer: Medicare HMO | Admitting: Internal Medicine

## 2020-02-14 ENCOUNTER — Encounter: Payer: Self-pay | Admitting: Internal Medicine

## 2020-02-14 ENCOUNTER — Other Ambulatory Visit: Payer: Self-pay

## 2020-02-14 VITALS — BP 113/76 | HR 80 | Temp 98.0°F | Resp 16 | Ht 65.0 in | Wt 189.0 lb

## 2020-02-14 DIAGNOSIS — I5189 Other ill-defined heart diseases: Secondary | ICD-10-CM | POA: Diagnosis not present

## 2020-02-14 DIAGNOSIS — G4719 Other hypersomnia: Secondary | ICD-10-CM | POA: Diagnosis not present

## 2020-02-14 DIAGNOSIS — J4520 Mild intermittent asthma, uncomplicated: Secondary | ICD-10-CM

## 2020-02-14 DIAGNOSIS — K219 Gastro-esophageal reflux disease without esophagitis: Secondary | ICD-10-CM

## 2020-02-14 NOTE — Progress Notes (Signed)
[facility] [place], [place] [address]  Pulmonary Sleep Medicine   Office Visit Note  Patient Name: Susan [name] DOB: April 21, 1956 MRN [id number]  Date of Service: 02/14/2020  Complaints/HPI: Pt is here to follow up on PFT and Echo.  Her PFT was WNL.  Her echo showed Normal EF, mild TR and PR, and diastolic dysfunction. She reports her asthma has been giving her issues with all the pollen right now.  She reports continued excessive daytime sleepiness, she denies having fallen asleep while driving.  She does report needing to stop and take a 2-3 hour nap during the day.     ROS  General: (-) fever, (-) chills, (-) night sweats, (-) weakness Skin: (-) rashes, (-) itching,. Eyes: (-) visual changes, (-) redness, (-) itching. Nose and Sinuses: (-) nasal stuffiness or itchiness, (-) postnasal drip, (-) nosebleeds, (-) sinus trouble. Mouth and Throat: (-) sore throat, (-) hoarseness. Neck: (-) swollen glands, (-) enlarged thyroid, (-) neck pain. Respiratory: - cough, (-) bloody sputum, - shortness of breath, - wheezing. Cardiovascular: - ankle swelling, (-) chest pain. Lymphatic: (-) lymph node enlargement. Neurologic: (-) numbness, (-) tingling. Psychiatric: (-) anxiety, (-) depression   Current Medication: Outpatient Encounter Medications as of 02/14/2020  Medication Sig  . acetaminophen (TYLENOL) 500 MG tablet Take by mouth.  Marland Kitchen albuterol (PROVENTIL HFA;VENTOLIN HFA) 108 (90 Base) MCG/ACT inhaler Inhale 2 puffs into the lungs every 6 (six) hours as needed for wheezing or shortness of breath.  Marland Kitchen albuterol (PROVENTIL) (2.5 MG/3ML) 0.083% nebulizer solution Take 3 mLs (2.5 mg total) by nebulization every 6 (six) hours as needed for wheezing or shortness of breath.  . ALPRAZolam (XANAX) 0.5 MG tablet Take 1 tablet (0.5 mg total) by mouth 3 (three) times daily as needed for anxiety.  Derrill Memo ON 03/04/2020] ALPRAZolam (XANAX) 0.5 MG tablet Take 1 tablet (0.5 mg  total) by mouth 3 (three) times daily as needed for sleep or anxiety.  Derrill Memo ON 03/02/2020] amitriptyline (ELAVIL) 25 MG tablet Take 1 tablet (25 mg total) by mouth at bedtime.  . Ascorbic Acid (VITAMIN C) 100 MG tablet Take 100 mg by mouth daily.  . calcium carbonate (OS-CAL) 1250 (500 Ca) MG chewable tablet Chew by mouth.  . carisoprodol (SOMA) 350 MG tablet Take 350 mg by mouth at bedtime. Patient states that she takes 1/2 tablet nightly  . Dentifrices (BIOTENE DRY MOUTH DT) by Transmucosal route.  . eletriptan (RELPAX) 40 MG tablet Take 40 mg by mouth as needed for migraine. may repeat in 2 hours if necessary   . EPINEPHRINE 0.3 mg/0.3 mL IJ SOAJ injection USE AS DIRECTED  . estradiol (CLIMARA - DOSED IN MG/24 HR) 0.1 mg/24hr patch Place 1 patch (0.1 mg total) onto the skin once a week.  . ezetimibe (ZETIA) 10 MG tablet Take 1 tablet (10 mg total) by mouth daily.  . famotidine (PEPCID) 10 MG tablet Take 20 mg by mouth.  . fenofibrate (TRICOR) 145 MG tablet Take 1 tablet (145 mg total) by mouth daily.  . fluticasone (FLONASE) 50 MCG/ACT nasal spray Place 2 sprays into both nostrils daily.  . fluticasone furoate-vilanterol (BREO ELLIPTA) 100-25 MCG/INH AEPB Inhale 1 puff into the lungs daily.  Marland Kitchen gabapentin (NEURONTIN) 600 MG tablet Take 1/2 (one-half) tablet by mouth twice daily  . HYDROcodone-acetaminophen (NORCO) 7.5-325 MG tablet Take 1-2 tablets by mouth every 6 (six) hours as needed for moderate pain.  . hydroxychloroquine (PLAQUENIL) 200 MG tablet Take by  mouth 2 (two) times daily.  . hyoscyamine (NULEV) 0.125 MG TBDP Place 0.125 mg under the tongue every 4 (four) hours as needed for bladder spasms.   . Lactobacillus (DIGESTIVE HEALTH PROBIOTIC PO) Take 1 tablet by mouth daily.  Marland Kitchen levothyroxine (SYNTHROID) 137 MCG tablet Take 137 mcg by mouth every morning.  . Levothyroxine Sodium 137 MCG CAPS Take 1 capsule (137 mcg total) by mouth daily before breakfast.  . LINZESS 290 MCG CAPS  capsule Take 290 mcg by mouth daily.  . Magnesium Oxide (NATRUL MAGNESIUM PO) Take by mouth.  . montelukast (SINGULAIR) 10 MG tablet TAKE ONE TABLET BY MOUTH ONCE DAILY FOR ASTHMA  . Multiple Vitamins-Minerals (HAIR SKIN AND NAILS FORMULA PO) Take by mouth.  . Multiple Vitamins-Minerals (ZINC PO) Take by mouth.  . mupirocin ointment (BACTROBAN) 2 %   . pantoprazole (PROTONIX) 40 MG tablet Take 40 mg by mouth daily.  Vladimir Faster Glycol-Propyl Glycol 0.4-0.3 % SOLN Apply to eye.  . predniSONE (DELTASONE) 10 MG tablet 12 day taper.  46m on days 1 and 2, 575mon days 3 and 4, 4058mn days 5 and 6, 74m61m days 7 and 8, 20mg22mdays 9 and 10, 10 mg on days 11 and 12.  . PREMarland KitchenCRIPTION MEDICATION 1 Syringe by Subdermal route once a week. Allergy shot  . Probiotic Product ([place] Take by mouth.  . sodium chloride (OCEAN) 0.65 % SOLN nasal spray Place 1 spray into both nostrils as needed for congestion.  . sodium chloride 0.9 % nebulizer solution   . SUMAtriptan-naproxen (TREXIMET) 85-500 MG tablet   . Tuberculin-Allergy Syringes 28G X 1/2" 1 ML MISC 1 each by Does not apply route every 14 (fourteen) days. For allergy injections  . Vaginal Lubricant (REPLENS) GEL Place vaginally.  . [STDerrill Memo/04/2020] venlafaxine XR (EFFEXOR-XR) 150 MG 24 hr capsule Take 2 capsules (300 mg total) by mouth at bedtime.  . Vitamin D, Ergocalciferol, (DRISDOL) 1.25 MG (50000 UNIT) CAPS capsule TAKE 1 CAPSULE (50,000 UNITS TOTAL) BY MOUTH EVERY 7 (SEVEN) DAYS. MONDAY  . vitamin E 1000 UNIT capsule Take by mouth.  . WhiDema Severinolatum-Mineral Oil (WH PECairoOL-MINERAL OIL-LANOLIN) 0.1-0.1 % OINT    No facility-administered encounter medications on file as of 02/14/2020.    Surgical History: Past Surgical History:  Procedure Laterality Date  . ABDOMINAL HYSTERECTOMY  2006  . BLADDER REPAIR    . BREAST CYST ASPIRATION Left yrs ago  . CHOLECYSTECTOMY  1989  . COLONOSCOPY WITH ESOPHAGOGASTRODUODENOSCOPY  (EGD)    . COLONOSCOPY WITH PROPOFOL N/A 08/03/2017   Procedure: COLONOSCOPY WITH PROPOFOL;  Surgeon: SkulsLollie Sails  [facility];  Service: Endoscopy;  Laterality: N/A;  . COLONOSCOPY WITH PROPOFOL N/A 10/18/2018   Procedure: COLONOSCOPY WITH PROPOFOL;  Surgeon: SkulsLollie Sails  [facility];  Service: Endoscopy;  Laterality: N/A;  . ESOPHAGOGASTRODUODENOSCOPY N/A 10/07/2013   Procedure: ESOPHAGOGASTRODUODENOSCOPY (EGD);  Surgeon: SandiDanie Binder  [facility];  Service: Endoscopy;  Laterality: N/A;  patient received heparin at 530am given phenergan 25mg 12m0 minutes before  . EYE SURGERY     lacrimal gland   . FLEXIBLE BRONCHOSCOPY N/A 06/04/2016   Procedure: FLEXIBLE BRONCHOSCOPY;  Surgeon: SaadatAllyne Gee Location: [facility];  Service: Pulmonary;  Laterality: N/A;  . INCONTINENCE SURGERY  2004  . NASAL SINUS SURGERY    . THYROID LOBECTOMY    . TOTAL THYROIDECTOMY  Medical History: Past Medical History:  Diagnosis Date  . Arthritis   . Asthma   . Asthmatic bronchitis   . BRCA negative 05/2014   MyRisk neg  . Celiac disease   . Celiac sprue   . Chronic anxiety   . Complication of anesthesia    vomiting  . COPD (chronic obstructive pulmonary disease) ([place])   . Depression   . Environmental allergies   . Family history of breast cancer 05/2014   MyRisk neg; IBIS=29.5%  . Fibromyalgia    neuropathy all over  . Gastritis 10/08/2013  . GERD (gastroesophageal reflux disease)   . Hyperlipemia   . Hyperlipidemia    Intolerant to statins  . Hypothyroidism (acquired)   . Increased risk of breast cancer 05/2014   IBIS=29.5%  . Increased risk of breast cancer 05/2014   IBIS=29.5%  . Migraine   . Migraines   . 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 Pancreatitis   . Sjogren's disease ([place])   . Spastic colon   . Thyroid cancer ([place]) 1990 and 1994   Total thyroidectomy with radioactive iodine.     Family  History: Family History  Problem Relation Age of Onset  . Arthritis/Rheumatoid Mother        died in age 32s  . Breast cancer Mother 16  . Breast cancer Maternal Grandmother        40's  . Breast cancer Maternal Aunt        X 2. 50's  . Lung cancer Maternal Aunt   . Pancreatitis Neg Hx   . Colon cancer Neg Hx     Social History: Social History   Socioeconomic History  . Marital status: Married    Spouse name: Not on file  . Number of children: 0  . Years of education: Not on file  . Highest education level: Not on file  Occupational History  . Not on file  Tobacco Use  . Smoking status: Never Smoker  . Smokeless tobacco: Never Used  Substance and Sexual Activity  . Alcohol use: No  . Drug use: No  . Sexual activity: Yes    Birth control/protection: Surgical  Other Topics Concern  . Not on file  Social History Narrative  . Not on file   Social Determinants of Health   Financial Resource Strain:   . Difficulty of Paying Living Expenses:   Food Insecurity:   . Worried About Charity fundraiser in the Last Year:   . Arboriculturist in the Last Year:   Transportation Needs:   . Film/video editor (Medical):   Marland Kitchen Lack of Transportation (Non-Medical):   Physical Activity:   . Days of Exercise per Week:   . Minutes of Exercise per Session:   Stress:   . Feeling of Stress :   Social Connections:   . Frequency of Communication with Friends and Family:   . Frequency of Social Gatherings with Friends and Family:   . Attends Religious Services:   . Active Member of Clubs or Organizations:   . Attends Archivist Meetings:   Marland Kitchen Marital Status:   Intimate Partner Violence:   . Fear of Current or Ex-Partner:   . Emotionally Abused:   Marland Kitchen Physically Abused:   . Sexually Abused:     Vital Signs: Blood pressure 113/76, pulse 80, temperature 98 F (36.7 C), resp. rate 16, height _0  (1.651 m), weight 189 lb (85.7 kg), SpO2 97 %.  Examination: General  Appearance:  The patient is well-developed, well-nourished, and in no distress. Skin: Gross inspection of skin unremarkable. Head: normocephalic, no gross deformities. Eyes: no gross deformities noted. ENT: ears appear grossly normal no exudates. Neck: Supple. No thyromegaly. No LAD. Respiratory: clear bilaterally. Cardiovascular: Normal S1 and S2 without murmur or rub. Extremities: No cyanosis. pulses are equal. Neurologic: Alert and oriented. No involuntary movements.  LABS: Recent Results (from the past 2160 hour(s))  CBC with Differential/Platelet     Status: None   Collection Time: 01/31/20 10:55 AM  Result Value Ref Range   WBC 5.0 3.8 - 10.8 Thousand/uL   RBC 4.31 3.80 - 5.10 Million/uL   Hemoglobin 12.6 11.7 - 15.5 g/dL   HCT 38.4 35.0 - 45.0 %   MCV 89.1 80.0 - 100.0 fL   MCH 29.2 27.0 - 33.0 pg   MCHC 32.8 32.0 - 36.0 g/dL   RDW 12.5 11.0 - 15.0 %   Platelets 343 140 - 400 Thousand/uL   MPV 9.5 7.5 - 12.5 fL   Neutro Abs 2,470 1,500 - 7,800 cells/uL   Lymphs Abs 1,815 850 - 3,900 cells/uL   Absolute Monocytes 435 200 - 950 cells/uL   Eosinophils Absolute 230 15 - 500 cells/uL   Basophils Absolute 50 0 - 200 cells/uL   Neutrophils Relative % 49.4 %   Total Lymphocyte 36.3 %   Monocytes Relative 8.7 %   Eosinophils Relative 4.6 %   Basophils Relative 1.0 %  COMPLETE METABOLIC PANEL WITH GFR     Status: None   Collection Time: 01/31/20 10:55 AM  Result Value Ref Range   Glucose, Bld 87 65 - 99 mg/dL    Comment: .            Fasting reference interval .    BUN 13 7 - 25 mg/dL   Creat 0.90 0.50 - 0.99 mg/dL    Comment: For patients >60 years of age, the reference limit for Creatinine is approximately 13% higher for people identified as African-American. .    GFR, Est Non African American 68 > OR = 60 mL/min/1.52m   GFR, Est African American 78 > OR = 60 mL/min/1.763m  BUN/Creatinine Ratio NOT APPLICABLE 6 - 22 (calc)   Sodium 139 135 - 146 mmol/L    Potassium 4.3 3.5 - 5.3 mmol/L   Chloride 106 98 - 110 mmol/L   CO2 25 20 - 32 mmol/L   Calcium 9.7 8.6 - 10.4 mg/dL   Total Protein 7.1 6.1 - 8.1 g/dL   Albumin 4.3 3.6 - 5.1 g/dL   Globulin 2.8 1.9 - 3.7 g/dL (calc)   AG Ratio 1.5 1.0 - 2.5 (calc)   Total Bilirubin 0.3 0.2 - 1.2 mg/dL   Alkaline phosphatase (APISO) 59 37 - 153 U/L   AST 18 10 - 35 U/L   ALT 19 6 - 29 U/L  Urinalysis, Routine w reflex microscopic     Status: None   Collection Time: 01/31/20 10:55 AM  Result Value Ref Range   Color, Urine YELLOW YELLOW   APPearance CLEAR CLEAR   Specific Gravity, Urine 1.009 1.001 - 1.03   pH 7.0 5.0 - 8.0   Glucose, UA NEGATIVE NEGATIVE   Bilirubin Urine NEGATIVE NEGATIVE   Ketones, ur NEGATIVE NEGATIVE   Hgb urine dipstick NEGATIVE NEGATIVE   Protein, ur NEGATIVE NEGATIVE   Nitrite NEGATIVE NEGATIVE   Leukocytes,Ua NEGATIVE NEGATIVE  Sedimentation rate     Status: None   Collection Time: 01/31/20 10:55 AM  Result Value Ref Range   Sed Rate 11 0 - 30 mm/h  CK     Status: None   Collection Time: 01/31/20 10:55 AM  Result Value Ref Range   Total CK 44 29 - 143 U/L  Rheumatoid factor     Status: None   Collection Time: 01/31/20 10:55 AM  Result Value Ref Range   Rhuematoid fact SerPl-aCnc <00 <76 IU/mL  Cyclic citrul peptide antibody, IgG     Status: None   Collection Time: 01/31/20 10:55 AM  Result Value Ref Range   Cyclic Citrullin Peptide Ab <16 UNITS    Comment: Reference Range Negative:            <20 Weak Positive:       20-39 Moderate Positive:   40-59 Strong Positive:     >59 .   ANA     Status: Abnormal   Collection Time: 01/31/20 10:55 AM  Result Value Ref Range   Anti Nuclear Antibody (ANA) POSITIVE (A) NEGATIVE    Comment: ANA IFA is a first line screen for detecting the presence of up to approximately 150 autoantibodies in various autoimmune diseases. A positive ANA IFA result is suggestive of autoimmune disease and reflexes to titer and pattern.  Further laboratory testing may be considered if clinically indicated. . For additional information, please refer to http://education.[facility].com/faq/FAQ177 (This link is being provided for informational/ educational purposes only.) .   RNP Antibody     Status: None   Collection Time: 01/31/20 10:55 AM  Result Value Ref Range   Ribonucleic Protein(ENA) Antibody, IgG <1.0 NEG <1.0 NEG AI  Anti-Smith antibody     Status: None   Collection Time: 01/31/20 10:55 AM  Result Value Ref Range   ENA SM Ab Ser-aCnc <1.0 NEG <1.0 NEG AI  Sjogrens syndrome-A extractable nuclear antibody     Status: None   Collection Time: 01/31/20 10:55 AM  Result Value Ref Range   SSA (Ro) (ENA) Antibody, IgG <1.0 NEG <1.0 NEG AI  Sjogrens syndrome-B extractable nuclear antibody     Status: None   Collection Time: 01/31/20 10:55 AM  Result Value Ref Range   SSB ([place]) (ENA) Antibody, IgG <1.0 NEG <1.0 NEG AI  Anti-DNA antibody, double-stranded     Status: None   Collection Time: 01/31/20 10:55 AM  Result Value Ref Range   ds DNA Ab <1 IU/mL    Comment:                            IU/mL       Interpretation                            < or = 4    Negative                            5-9         Indeterminate                            > or = 10   Positive .   C3 and C4     Status: None   Collection Time: 01/31/20 10:55 AM  Result Value Ref Range   C3 Complement 171 83 - 193 mg/dL   C4 Complement 28 15 - 57 mg/dL  Beta-2 glycoprotein  antibodies     Status: None   Collection Time: 01/31/20 10:55 AM  Result Value Ref Range   Beta-2 Glyco I IgG <9 < OR = 20 SGU   Beta-2 Glyco 1 IgM 9 < OR = 20 SMU   Beta-2 Glyco 1 IgA <9 < OR = 20 SAU  Cardiolipin antibodies, IgG, IgM, IgA     Status: None   Collection Time: 01/31/20 10:55 AM  Result Value Ref Range   Anticardiolipin IgA <11 APL    Comment:     Value       Interpretation     -----       --------------     < or = 11   Negative     12 - 20      Indeterminate     21 - 80     Low to Medium Positive     >80         High Positive      Anticardiolipin IgG <14 GPL    Comment:     Value       Interpretation     -----       --------------     < or = 14   Negative     15 - 20     Indeterminate     21 - 80     Low to Medium Positive     >80         High Positive    Anticardiolipin IgM <12 MPL    Comment:     Value       Interpretation     -----       --------------     < or = 12   Negative     13 - 20     Indeterminate     21 - 80     Low to Medium Positive     >80         High Positive . The antiphospholipid antibody syndrome (APS) is a  clinical-pathologic correlation that includes a  clinical event (e.g. thrombosis, pregnancy loss,  thrombocytopenia) and persistent positive  antiphospholipid antibodies  (IgM or IgG ACA >40 MPL/GPL, IgM or IgG anti-b2GPI antibodies or a lupus anticoagulant). International  consensus guidelines for APS suggest waiting at least  12 weeks before retesting to confirm antibody  persistence.  The Systemic Lupus International  Collaborating Clinics immunological classification  criteria for systemic lupus erythematosus (SLE)  include testing for isotype IgA, which has yet to be  incorporated into APS criteria. Low level  antiphospholipid antibodies may sometimes be detected  in the setting of infection, drug therapy or aging.   Lupus Anticoagulant Eval w/Reflex     Status: None   Collection Time: 01/31/20 10:55 AM  Result Value Ref Range   Lupus Anticoagulant see note     Comment: A Lupus Anticoagulant is not detected. Marland Kitchen Reference Range:  Not Detected . For additional information, please refer to http://education.[facility].com/faq/FAQ01v2 . (This link is being provided for informational/ educational purposes only.) . Marland Kitchen This interpretation is based on the following test results. Marland Kitchen    PTT-LA Screen 34 <=40 sec   dRVVT 39 <=45 sec   PT, Mixing Interp Not Indicated    Anti-nuclear ab-titer (ANA titer)     Status: Abnormal   Collection Time: 01/31/20 10:55 AM  Result Value Ref Range   ANA Titer 1 1:320 (H) titer    Comment:  Reference Range                 <1:40        Negative                 1:40-1:80    Low Antibody Level                 >1:80        Elevated Antibody Level .    ANA Pattern 1 Nuclear, Homogeneous (A)     Comment: Homogeneous pattern is associated with systemic lupus erythematosus (SLE), drug-induced lupus and juvenile idiopathic arthritis. . AC-1: Homogeneous . International Consensus on ANA Patterns (https://www.hernandez-brewer.com/)     Radiology: DG Wrist Complete Left  Result Date: 10/26/2019 CLINICAL DATA:  Bilateral hand and wrist pain. EXAM: LEFT WRIST - COMPLETE 3+ VIEW COMPARISON:  None. FINDINGS: Advanced osteoarthritis changes at the 1st carpometacarpal joint with joint space narrowing, spurring. No bony erosions. No acute bony abnormality. Specifically, no fracture, subluxation, or dislocation. IMPRESSION: Osteoarthritis changes most pronounced at the 1st carpometacarpal joint. No acute bony abnormality. Electronically Signed   By: Rolm Baptise M.D.   On: 10/26/2019 11:56   DG Wrist Complete Right  Result Date: 10/26/2019 CLINICAL DATA:  Chronic bilateral hand and wrist pain. EXAM: RIGHT WRIST - COMPLETE 3+ VIEW COMPARISON:  Right hand today FINDINGS: Advanced osteoarthritis changes at the 1st carpometacarpal joint with joint space narrowing and spurring. Remainder the joint spaces are maintained. No bony erosions. No acute bony abnormality. Specifically, no fracture, subluxation, or dislocation. IMPRESSION: Advanced osteoarthritis changes of the 1st carpometacarpal joint. No acute bony abnormality. Electronically Signed   By: Rolm Baptise M.D.   On: 10/26/2019 11:57   DG Hand Complete Left  Result Date: 10/26/2019 CLINICAL DATA:  Bilateral hand and wrist pain for several weeks EXAM: LEFT  HAND - COMPLETE 3+ VIEW COMPARISON:  Wrist series today FINDINGS: Advanced osteoarthritis changes at the 1st carpometacarpal joint with joint space narrowing, spurring. Early osteoarthritis changes in the IP joints with joint space narrowing and early spurring. No bony erosions. No acute bony abnormality. Specifically, no fracture, subluxation, or dislocation. IMPRESSION: Osteoarthritis changes most pronounced at the 1st carpometacarpal joint. No acute bony abnormality. Electronically Signed   By: Rolm Baptise M.D.   On: 10/26/2019 11:55   DG Hand Complete Right  Result Date: 10/26/2019 CLINICAL DATA:  Bilateral hand and wrist pain for weeks. EXAM: RIGHT HAND - COMPLETE 3+ VIEW COMPARISON:  Right wrist today FINDINGS: Advanced osteoarthritis at the 1st carpometacarpal joint with joint space narrowing and spurring. Early joint space narrowing and spurring in the IP joints. No acute bony abnormality. Specifically, no fracture, subluxation, or dislocation. IMPRESSION: Osteoarthritis changes in the right hand and wrist, most pronounced at the 1st carpometacarpal joint. No acute bony abnormality. Electronically Signed   By: Rolm Baptise M.D.   On: 10/26/2019 11:55    No results found.  XR Foot 2 Views Left  Result Date: 01/31/2020 No MTP narrowing was noted.  PIP and DIP narrowing was noted.  No intertarsal tibiotalar joint space narrowing was noted.  Inferior and posterior calcaneal spurs were noted.  No erosive changes were noted. Impression: These findings are consistent with osteoarthritis of the foot.  XR Foot 2 Views Right  Result Date: 01/31/2020 First MTP, PIP and DIP narrowing was noted.  No tarsal or tibiotalar joint space narrowing was noted.  Inferior and posterior calcaneal spurs were noted.  No erosive changes were  noted. Impression: These findings are consistent with osteoarthritis of the foot.  XR KNEE 3 VIEW LEFT  Result Date: 01/31/2020 Moderate medial compartment narrowing was noted.   Severe patellofemoral narrowing was noted.  No chondrocalcinosis was noted. Impression: These findings are consistent with moderate osteoarthritis and severe chondromalacia patella.  XR KNEE 3 VIEW RIGHT  Result Date: 01/31/2020 Moderate medial compartment narrowing was noted.  Moderate patellofemoral narrowing was noted.  No chondrocalcinosis was noted. Impression: These findings are consistent with moderate osteoarthritis and moderate chondromalacia patella of the knee.     Assessment and Plan: Patient Active Problem List   Diagnosis Date Noted  . Chronic wrist pain, right 11/02/2019  . Chronic pain of left wrist 11/02/2019  . Pain in joints of right hand 11/02/2019  . Pain in joint of left hand 11/02/2019  . Primary osteoarthritis of both hands 11/02/2019  . MDD (major depressive disorder), recurrent, in partial remission ([place]) 10/05/2019  . Dysuria 04/03/2019  . Generalized anxiety disorder 02/28/2019  . Chronic pain of both knees 03/01/2018  . Encounter for general adult medical examination with abnormal findings 03/01/2018  . Primary osteoarthritis of both first carpometacarpal joints 03/01/2018  . Chronic obstructive pulmonary disease, unspecified ([place]) 12/04/2017  . Shortness of breath 12/04/2017  . Allergic rhinitis, unspecified 12/04/2017  . 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 Vaginal atrophy 09/01/2017  . Chronic right-sided low back pain without sciatica 01/05/2017  . Primary Sjogren's syndrome ([place]) 01/05/2017  . CD (celiac disease) 04/17/2015  . Gastroesophageal reflux disease with esophagitis 04/17/2015  . Hx of Sjogren's disease ([place]) 04/17/2015  . Acute asthma exacerbation 10/14/2013  . Acute respiratory failure ([place]) 10/14/2013  . Gastritis 10/08/2013  . Bradycardia 10/07/2013  . Esophageal spasm 10/06/2013  . Substernal precordial chest pain 10/06/2013  . Chronic anxiety 10/06/2013  . GERD (gastroesophageal  reflux disease) 10/06/2013  . Chronic asthma 10/06/2013  . 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 Depression with anxiety 10/06/2013  . Fibromyalgia 10/06/2013  . Hypothyroidism 10/06/2013  . Chronic migraine 10/06/2013    1. Excessive daytime sleepiness Get sleep study for evaluate for osa, due to excessive daytime sleepiness, and diastolic dysfunction on echo.   - PSG SLEEP STUDY; Future  2. Diastolic dysfunction - PSG SLEEP STUDY; Future  3. Mild intermittent chronic asthma without complication Continue inhalers as prescribed.   4. Gastroesophageal reflux disease without esophagitis Controlled, continue present management.   General Counseling: I have discussed the findings of the evaluation and examination with Jaci.  I have also discussed any further diagnostic evaluation thatmay be needed or ordered today. Aneli verbalizes understanding of the findings of todays visit. We also reviewed her medications today and discussed drug interactions and side effects including but not limited excessive drowsiness and altered mental states. We also discussed that there is always a risk not just to her but also people around her. she has been encouraged to call the office with any questions or concerns that should arise related to todays visit.  No orders of the defined types were placed in this encounter.    Time spent: 30 This patient was seen by Orson Gear AGNP-C in Collaboration with Dr. Devona Konig as a part of collaborative care agreement.   I have personally obtained a history, examined the patient, evaluated laboratory and imaging results, formulated the assessment and plan and placed orders.    Allyne Gee, MD [facility] Pulmonary and Critical Care Sleep medicine

## 2020-02-20 NOTE — Progress Notes (Signed)
Office Visit Note  Patient: Susan [name]             Date of Birth: Dec 11, 1955           MRN: [id number]             PCP: Ronnell Freshwater, NP Referring: Allyne Gee, MD Visit Date: 02/29/2020 Occupation: @GUAROCC @  Subjective:  Dry mouth, dry eyes and joint pain.   History of Present Illness: Susan [name] is a 64 y.o. female with history of Sjogren's and osteoarthritis.  She states she continues to have dry mouth and dry eyes.  She also has noted vaginal dryness.  She has been using over-the-counter products.  She has been taking Plaquenil on a regular basis.  She states she had eye examination about 4 5 weeks ago.  I have advised her to send report.  She has pain in almost all of her joints.  She also has generalized pain from fibromyalgia.  She is waiting to get into a pain clinic to get her medications.  She complains of lot of discomfort in her hands and her knee joints.  Activities of Daily Living:  Patient reports morning stiffness for 1 hour.   Patient Reports nocturnal pain.  Difficulty dressing/grooming: Reports Difficulty climbing stairs: Reports Difficulty getting out of chair: Reports Difficulty using hands for taps, buttons, cutlery, and/or writing: Reports  Review of Systems  Constitutional: Positive for fatigue. Negative for night sweats, weight gain and weight loss.  HENT: Positive for mouth dryness and nose dryness. Negative for mouth sores, trouble swallowing and trouble swallowing.   Eyes: Positive for dryness. Negative for pain, redness and visual disturbance.  Respiratory: Positive for shortness of breath. Negative for cough and difficulty breathing.   Cardiovascular: Negative for chest pain, palpitations, hypertension, irregular heartbeat and swelling in legs/feet.  Gastrointestinal: Negative for blood in stool, constipation and diarrhea.  Endocrine: Negative for increased urination.  Genitourinary: Positive for vaginal dryness. Negative for  difficulty urinating.  Musculoskeletal: Positive for arthralgias, joint pain, myalgias, morning stiffness, muscle tenderness and myalgias. Negative for joint swelling and muscle weakness.  Skin: Negative for color change, rash, hair loss, redness, skin tightness, ulcers and sensitivity to sunlight.  Allergic/Immunologic: Negative for susceptible to infections.  Neurological: Positive for memory loss and weakness. Negative for dizziness, numbness, headaches and night sweats.  Hematological: Negative for bruising/bleeding tendency and swollen glands.  Psychiatric/Behavioral: Negative for depressed mood, confusion and sleep disturbance. The patient is not nervous/anxious.     PMFS History:  Patient Active Problem List   Diagnosis Date Noted  . Chronic wrist pain, right 11/02/2019  . Chronic pain of left wrist 11/02/2019  . Pain in joints of right hand 11/02/2019  . Pain in joint of left hand 11/02/2019  . Primary osteoarthritis of both hands 11/02/2019  . MDD (major depressive disorder), recurrent, in partial remission ([place]) 10/05/2019  . Dysuria 04/03/2019  . Generalized anxiety disorder 02/28/2019  . Chronic pain of both knees 03/01/2018  . Encounter for general adult medical examination with abnormal findings 03/01/2018  . Primary osteoarthritis of both first carpometacarpal joints 03/01/2018  . Chronic obstructive pulmonary disease, unspecified ([place]) 12/04/2017  . Shortness of breath 12/04/2017  . Allergic rhinitis, unspecified 12/04/2017  . 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 Vaginal atrophy 09/01/2017  . Chronic right-sided low back pain without sciatica 01/05/2017  . Primary Sjogren's syndrome ([place]) 01/05/2017  . CD (celiac disease) 04/17/2015  .  Gastroesophageal reflux disease with esophagitis 04/17/2015  . Hx of Sjogren's disease ([place]) 04/17/2015  . Acute asthma exacerbation 10/14/2013  . Acute respiratory failure ([place])  10/14/2013  . Gastritis 10/08/2013  . Bradycardia 10/07/2013  . Esophageal spasm 10/06/2013  . Substernal precordial chest pain 10/06/2013  . Chronic anxiety 10/06/2013  . GERD (gastroesophageal reflux disease) 10/06/2013  . Chronic asthma 10/06/2013  . 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 Depression with anxiety 10/06/2013  . Fibromyalgia 10/06/2013  . Hypothyroidism 10/06/2013  . Chronic migraine 10/06/2013    Past Medical History:  Diagnosis Date  . Arthritis   . Asthma   . Asthmatic bronchitis   . BRCA negative 05/2014   MyRisk neg  . Celiac disease   . Celiac sprue   . Chronic anxiety   . Complication of anesthesia    vomiting  . COPD (chronic obstructive pulmonary disease) ([place])   . Depression   . Environmental allergies   . Family history of breast cancer 05/2014   MyRisk neg; IBIS=29.5%  . Fibromyalgia    neuropathy all over  . Gastritis 10/08/2013  . GERD (gastroesophageal reflux disease)   . Hyperlipemia   . Hyperlipidemia    Intolerant to statins  . Hypothyroidism (acquired)   . Increased risk of breast cancer 05/2014   IBIS=29.5%  . Increased risk of breast cancer 05/2014   IBIS=29.5%  . Migraine   . Migraines   . 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 Pancreatitis   . Sjogren's disease ([place])   . Spastic colon   . Thyroid cancer ([place]) 1990 and 1994   Total thyroidectomy with radioactive iodine.     Family History  Problem Relation Age of Onset  . Arthritis/Rheumatoid Mother        died in age 57s  . Breast cancer Mother 48  . Breast cancer Maternal Grandmother        40's  . Breast cancer Maternal Aunt        X 2. 50's  . Lung cancer Maternal Aunt   . Pancreatitis Neg Hx   . Colon cancer Neg Hx    Past Surgical History:  Procedure Laterality Date  . ABDOMINAL HYSTERECTOMY  2006  . BLADDER REPAIR    . BREAST CYST ASPIRATION Left yrs ago  . CHOLECYSTECTOMY  1989  . COLONOSCOPY WITH ESOPHAGOGASTRODUODENOSCOPY (EGD)    . COLONOSCOPY WITH  PROPOFOL N/A 08/03/2017   Procedure: COLONOSCOPY WITH PROPOFOL;  Surgeon: Lollie Sails, MD;  [facility] ENDOSCOPY;  Service: Endoscopy;  Laterality: N/A;  . COLONOSCOPY WITH PROPOFOL N/A 10/18/2018   Procedure: COLONOSCOPY WITH PROPOFOL;  Surgeon: Lollie Sails, MD;  Location[facility] ENDOSCOPY;  Service: Endoscopy;  Laterality: N/A;  . ESOPHAGOGASTRODUODENOSCOPY N/A 10/07/2013   Procedure: ESOPHAGOGASTRODUODENOSCOPY (EGD);  Surgeon: Danie Binder, MD;  Location: [facility] ENDO SUITE;  Service: Endoscopy;  Laterality: N/A;  patient received heparin at 530am given phenergan 36m IV 30 minutes before  . EYE SURGERY     lacrimal gland   . FLEXIBLE BRONCHOSCOPY N/A 06/04/2016   Procedure: FLEXIBLE BRONCHOSCOPY;  Surgeon: SAllyne Gee MD;  Location: [facility] ORS;  Service: Pulmonary;  Laterality: N/A;  . INCONTINENCE SURGERY  2004  . NASAL SINUS SURGERY    . THYROID LOBECTOMY    . TOTAL THYROIDECTOMY     Social History   Social History Narrative  . Not on file   Immunization History  Administered Date(s) Administered  . Influenza Inj Mdck Quad Pf 07/22/2018, 07/05/2019  . Influenza-Unspecified 08/17/2017  .  Moderna SARS-COVID-2 Vaccination 11/29/2019, 01/24/2020  . Tdap 07/27/2019  . Zoster Recombinat (Shingrix) 08/21/2017, 12/08/2017     Objective: Vital Signs: BP 114/76 (BP Location: Left Arm, Patient Position: Sitting, Cuff Size: Normal)   Pulse 80   Resp 16   Ht 5' 5"  (1.651 m)   Wt 187 lb (84.8 kg)   BMI 31.12 kg/m    Physical Exam Vitals and nursing note reviewed.  Constitutional:      Appearance: She is well-developed.  HENT:     Head: Normocephalic and atraumatic.  Eyes:     Conjunctiva/sclera: Conjunctivae normal.  Cardiovascular:     Rate and Rhythm: Normal rate and regular rhythm.     Heart sounds: Normal heart sounds.  Pulmonary:     Effort: Pulmonary effort is normal.     Breath sounds: Normal breath sounds.  Abdominal:     General: Bowel sounds are  normal.     Palpations: Abdomen is soft.  Musculoskeletal:     Cervical back: Normal range of motion.  Lymphadenopathy:     Cervical: No cervical adenopathy.  Skin:    General: Skin is warm and dry.     Capillary Refill: Capillary refill takes less than 2 seconds.  Neurological:     Mental Status: She is alert and oriented to person, place, and time.  Psychiatric:        Behavior: Behavior normal.      Musculoskeletal Exam: C-spine was in good range of motion.  Shoulder joints, elbow joints, wrist joints with good range of motion.  She had no synovitis on examination.  She has DIP and PIP thickening in her hands consistent with osteoarthritis.  She has good range of motion of her hip joints.  She had good range of motion of her knee joints with discomfort.  She had had some tenderness over MTPs and PIPs but no synovitis was noted.  CDAI Exam: CDAI Score: -- Patient Global: --; Provider Global: -- Swollen: --; Tender: -- Joint Exam 02/29/2020   No joint exam has been documented for this visit   There is currently no information documented on the homunculus. Go to the Rheumatology activity and complete the homunculus joint exam.  Investigation: No additional findings.  Imaging: XR Foot 2 Views Left  Result Date: 01/31/2020 No MTP narrowing was noted.  PIP and DIP narrowing was noted.  No intertarsal tibiotalar joint space narrowing was noted.  Inferior and posterior calcaneal spurs were noted.  No erosive changes were noted. Impression: These findings are consistent with osteoarthritis of the foot.  XR Foot 2 Views Right  Result Date: 01/31/2020 First MTP, PIP and DIP narrowing was noted.  No tarsal or tibiotalar joint space narrowing was noted.  Inferior and posterior calcaneal spurs were noted.  No erosive changes were noted. Impression: These findings are consistent with osteoarthritis of the foot.  XR KNEE 3 VIEW LEFT  Result Date: 01/31/2020 Moderate medial compartment  narrowing was noted.  Severe patellofemoral narrowing was noted.  No chondrocalcinosis was noted. Impression: These findings are consistent with moderate osteoarthritis and severe chondromalacia patella.  XR KNEE 3 VIEW RIGHT  Result Date: 01/31/2020 Moderate medial compartment narrowing was noted.  Moderate patellofemoral narrowing was noted.  No chondrocalcinosis was noted. Impression: These findings are consistent with moderate osteoarthritis and moderate chondromalacia patella of the knee.   Recent Labs: Lab Results  Component Value Date   WBC 5.0 01/31/2020   HGB 12.6 01/31/2020   PLT 343 01/31/2020  NA 139 01/31/2020   K 4.3 01/31/2020   CL 106 01/31/2020   CO2 25 01/31/2020   GLUCOSE 87 01/31/2020   BUN 13 01/31/2020   CREATININE 0.90 01/31/2020   BILITOT 0.3 01/31/2020   ALKPHOS 78 03/03/2019   AST 18 01/31/2020   ALT 19 01/31/2020   PROT 7.1 01/31/2020   ALBUMIN 4.7 03/03/2019   CALCIUM 9.7 01/31/2020   GFRAA 78 01/31/2020  January 31, 2020 UA negative, ANA 1: 320NH, ENA negative, C3-C4 normal, anticardiolipin negative, beta-2 negative, lupus anticoagulant negative, ESR 11, RF negative, anti-CCP negative, CK 44  Speciality Comments: No specialty comments available.  Procedures:  No procedures performed Allergies: Aspirin, Keflex [cephalexin], Azithromycin, Barley grass, Clarithromycin, Metoclopramide, Oat, Oatmeal, Other, Rye grass flower pollen extract [gramineae pollens], Vioxx [rofecoxib], Wheat bran, Wheat extract, Celebrex [celecoxib], and Metoclopramide hcl   Assessment / Plan:     Visit Diagnoses: Sjogren's syndrome with other organ involvement ([place]) - Diagnosed at [facility] 12 years ago and was placed on Plaquenil.  History of dry mouth, dental decay and dry eyes.  ANA positive, Ro negative, low negative, RF negative.  Patient has tolerated Plaquenil well.  She will continue Plaquenil.  She is also using over-the-counter products which were discussed today.  High risk  medication use - Plaquenil 200 mg p.o. twice daily.  Her labs have been stable.  She has been getting eye examination.  She states her last eye exam was 4 to 5 weeks ago.  We have given her a form to be filled by her ophthalmologist.  Primary osteoarthritis of both hands-joint protection muscle strengthening was discussed.  Primary osteoarthritis of both feet-proper fitting shoes were discussed.  Primary osteoarthritis of both knees - Bilateral moderate osteoarthritis and moderate to severe chondromalacia patella.  Weight loss diet and exercise was discussed.  Fibromyalgia-she continues to have generalized pain and discomfort.  Chronic pain syndrome-she is waiting to be scheduled at the pain clinic.  Other medical problems are listed as follows:  CD (celiac disease)  Esophageal spasm  Nutcracker esophagus  History of gastroesophageal reflux (GERD)  Chronic obstructive pulmonary disease, unspecified COPD type ([place])  History of asthma  Generalized anxiety disorder  MDD (major depressive disorder), recurrent, in partial remission ([facility])  Chronic migraine  History of thyroid cancer  Family history of rheumatoid arthritis  Orders: No orders of the defined types were placed in this encounter.  No orders of the defined types were placed in this encounter.    Follow-Up Instructions: Return in about 5 months (around 07/31/2020) for Sjogren's, Osteoarthritis.   Bo Merino, MD  Note - This record has been created using Editor, commissioning.  Chart creation errors have been sought, but may not always  have been located. Such creation errors do not reflect on  the standard of medical care.

## 2020-02-28 ENCOUNTER — Telehealth: Payer: Self-pay

## 2020-02-28 DIAGNOSIS — R69 Illness, unspecified: Secondary | ICD-10-CM | POA: Diagnosis not present

## 2020-02-28 NOTE — Telephone Encounter (Signed)
Confirmed PT sleep study.

## 2020-02-29 ENCOUNTER — Ambulatory Visit: Payer: Medicare HMO | Admitting: Internal Medicine

## 2020-02-29 ENCOUNTER — Ambulatory Visit: Payer: Medicare HMO | Admitting: Rheumatology

## 2020-02-29 ENCOUNTER — Other Ambulatory Visit: Payer: Self-pay

## 2020-02-29 ENCOUNTER — Encounter: Payer: Self-pay | Admitting: Rheumatology

## 2020-02-29 VITALS — BP 114/76 | HR 80 | Resp 16 | Ht 65.0 in | Wt 187.0 lb

## 2020-02-29 DIAGNOSIS — Z8709 Personal history of other diseases of the respiratory system: Secondary | ICD-10-CM

## 2020-02-29 DIAGNOSIS — G4733 Obstructive sleep apnea (adult) (pediatric): Secondary | ICD-10-CM

## 2020-02-29 DIAGNOSIS — M797 Fibromyalgia: Secondary | ICD-10-CM | POA: Diagnosis not present

## 2020-02-29 DIAGNOSIS — M19042 Primary osteoarthritis, left hand: Secondary | ICD-10-CM

## 2020-02-29 DIAGNOSIS — M3509 Sicca syndrome with other organ involvement: Secondary | ICD-10-CM

## 2020-02-29 DIAGNOSIS — K900 Celiac disease: Secondary | ICD-10-CM

## 2020-02-29 DIAGNOSIS — Z8719 Personal history of other diseases of the digestive system: Secondary | ICD-10-CM | POA: Diagnosis not present

## 2020-02-29 DIAGNOSIS — M170 Bilateral primary osteoarthritis of knee: Secondary | ICD-10-CM | POA: Diagnosis not present

## 2020-02-29 DIAGNOSIS — M19041 Primary osteoarthritis, right hand: Secondary | ICD-10-CM | POA: Diagnosis not present

## 2020-02-29 DIAGNOSIS — M19072 Primary osteoarthritis, left ankle and foot: Secondary | ICD-10-CM

## 2020-02-29 DIAGNOSIS — F411 Generalized anxiety disorder: Secondary | ICD-10-CM

## 2020-02-29 DIAGNOSIS — M19071 Primary osteoarthritis, right ankle and foot: Secondary | ICD-10-CM

## 2020-02-29 DIAGNOSIS — G43709 Chronic migraine without aura, not intractable, without status migrainosus: Secondary | ICD-10-CM

## 2020-02-29 DIAGNOSIS — G894 Chronic pain syndrome: Secondary | ICD-10-CM

## 2020-02-29 DIAGNOSIS — Z85850 Personal history of malignant neoplasm of thyroid: Secondary | ICD-10-CM

## 2020-02-29 DIAGNOSIS — J449 Chronic obstructive pulmonary disease, unspecified: Secondary | ICD-10-CM

## 2020-02-29 DIAGNOSIS — I5189 Other ill-defined heart diseases: Secondary | ICD-10-CM

## 2020-02-29 DIAGNOSIS — Z8261 Family history of arthritis: Secondary | ICD-10-CM

## 2020-02-29 DIAGNOSIS — Z79899 Other long term (current) drug therapy: Secondary | ICD-10-CM

## 2020-02-29 DIAGNOSIS — IMO0002 Reserved for concepts with insufficient information to code with codable children: Secondary | ICD-10-CM

## 2020-02-29 DIAGNOSIS — F3341 Major depressive disorder, recurrent, in partial remission: Secondary | ICD-10-CM

## 2020-02-29 DIAGNOSIS — K224 Dyskinesia of esophagus: Secondary | ICD-10-CM | POA: Diagnosis not present

## 2020-02-29 DIAGNOSIS — G4719 Other hypersomnia: Secondary | ICD-10-CM

## 2020-03-02 ENCOUNTER — Telehealth (HOSPITAL_COMMUNITY): Payer: Self-pay

## 2020-03-02 NOTE — Telephone Encounter (Signed)
Medication management - Message left for patient calling her back regarding her Alprazolam medication and to question if taking. Informed last order was sent 02/02/20 with instruction to begin order 03/04/20 with one refill then. Requested patient call our office back if questions about medication and dosage.

## 2020-03-13 ENCOUNTER — Telehealth: Payer: Self-pay

## 2020-03-13 NOTE — Telephone Encounter (Signed)
Confirmed appointment on 03/15/2020 and screened for covid. kl[facility]

## 2020-03-14 DIAGNOSIS — R69 Illness, unspecified: Secondary | ICD-10-CM | POA: Diagnosis not present

## 2020-03-15 ENCOUNTER — Ambulatory Visit: Payer: Medicare HMO | Admitting: Internal Medicine

## 2020-03-15 ENCOUNTER — Other Ambulatory Visit: Payer: Self-pay

## 2020-03-15 ENCOUNTER — Encounter: Payer: Self-pay | Admitting: Internal Medicine

## 2020-03-15 ENCOUNTER — Ambulatory Visit (INDEPENDENT_AMBULATORY_CARE_PROVIDER_SITE_OTHER): Payer: Medicare HMO | Admitting: Dermatology

## 2020-03-15 VITALS — BP 110/75 | HR 77 | Temp 97.7°F | Resp 16 | Ht 65.0 in | Wt 188.8 lb

## 2020-03-15 DIAGNOSIS — G4733 Obstructive sleep apnea (adult) (pediatric): Secondary | ICD-10-CM | POA: Diagnosis not present

## 2020-03-15 DIAGNOSIS — K219 Gastro-esophageal reflux disease without esophagitis: Secondary | ICD-10-CM

## 2020-03-15 DIAGNOSIS — R0602 Shortness of breath: Secondary | ICD-10-CM | POA: Diagnosis not present

## 2020-03-15 DIAGNOSIS — J4520 Mild intermittent asthma, uncomplicated: Secondary | ICD-10-CM | POA: Diagnosis not present

## 2020-03-15 DIAGNOSIS — Z872 Personal history of diseases of the skin and subcutaneous tissue: Secondary | ICD-10-CM

## 2020-03-15 DIAGNOSIS — L700 Acne vulgaris: Secondary | ICD-10-CM

## 2020-03-15 DIAGNOSIS — L988 Other specified disorders of the skin and subcutaneous tissue: Secondary | ICD-10-CM

## 2020-03-15 MED ORDER — DOXYCYCLINE HYCLATE 50 MG PO CAPS: ORAL_CAPSULE | ORAL | 0 refills | Status: DC

## 2020-03-15 NOTE — Progress Notes (Signed)
   New Patient Visit  Subjective  Susan [name] is a 64 y.o. female who presents for the following: Facial Elastosis (patient would like to discuss fillers for her marionette lines and oral commissures) and acne (patient has used Doxycycline 12m in the past and recently restarted Estrogen which she says helps with her acne).  The following portions of the chart were reviewed this encounter and updated as appropriate:  Tobacco  Allergies  Meds  Problems  Med Hx  Surg Hx  Fam Hx     Review of Systems:  No other skin or systemic complaints except as noted in HPI or Assessment and Plan.  Objective  Well appearing patient in no apparent distress; mood and affect are within normal limits.  A focused examination was performed including face, neck, chest and back and the face. Relevant physical exam findings are noted in the Assessment and Plan.  Objective  Head - Anterior (Face): Clear today other than scarring  Objective  Face: Rhytides and volume loss.   Images                     Assessment & Plan  Acne vulgaris Head - Anterior (Face)  Hx of cystic nodules - Patient recently restart Estrogen and she states that does help decrease frequency of flares. Restart Doxycyline 575mpo QD-BID #60 0RF.   doxycycline (VIBRAMYCIN) 50 MG capsule - Head - Anterior (Face)  Elastosis of skin Face  Restylane Refyne 1 cc injected today into: -B/L marionette lines -B/L oral commissures   Filling material injection - Face Prior to the procedure, the patient's past medical history, allergies and the rare but potential risks and complications were reviewed with the patient and a signed consent was obtained. Pre and post-treatment care was discussed and instructions provided.  Location: perioral and marionette lines  Filler Type: Restylane Refyne  Lot # [id number]. Date: 12/24/2020  Procedure: The area was prepped thoroughly with hibiclens The area was prepped  thoroughly with Puracyn. After introducing the needle into the desired treatment area, the syringe plunger was drawn back to ensure there was no flash of blood prior to injecting the filler in order to minimize risk of intravascular injection and vascular occlusion. After injection of the filler, the treated areas were cleansed and iced to reduce swelling. Post-treatment instructions were reviewed with the patient.       Patient tolerated the procedure well. The patient will call with any problems, questions or concerns prior to their next appointment.   Return if symptoms worsen or fail to improve, for cosmetic - Restylane Refyne x 1 syringe today for a total of $650 .  I,Luther RedoCMA, am acting as scribe for DaSarina SerMD .  Documentation: I have reviewed the above documentation for accuracy and completeness, and I agree with the above.  DaSarina SerMD

## 2020-03-15 NOTE — Progress Notes (Signed)
[facility] [place],  [address]  Pulmonary Sleep Medicine   Office Visit Note  Patient Name: Susan [name] DOB: 13-Oct-1956 MRN [id number]  Date of Service: 03/15/2020  Complaints/HPI: PT is here to review psg sleep study.  Her study shows 12 apneas/hypopneas with an overall ahi of 5.5.  She has mild obstructive sleep apnea. Her oxygen saturation did have a nadir of 86%.   ROS  General: (-) fever, (-) chills, (-) night sweats, (-) weakness Skin: (-) rashes, (-) itching,. Eyes: (-) visual changes, (-) redness, (-) itching. Nose and Sinuses: (-) nasal stuffiness or itchiness, (-) postnasal drip, (-) nosebleeds, (-) sinus trouble. Mouth and Throat: (-) sore throat, (-) hoarseness. Neck: (-) swollen glands, (-) enlarged thyroid, (-) neck pain. Respiratory: - cough, (-) bloody sputum, - shortness of breath, - wheezing. Cardiovascular: - ankle swelling, (-) chest pain. Lymphatic: (-) lymph node enlargement. Neurologic: (-) numbness, (-) tingling. Psychiatric: (-) anxiety, (-) depression   Current Medication: Outpatient Encounter Medications as of 03/15/2020  Medication Sig  . acetaminophen (TYLENOL) 500 MG tablet Take by mouth.  Marland Kitchen albuterol (PROVENTIL HFA;VENTOLIN HFA) 108 (90 Base) MCG/ACT inhaler Inhale 2 puffs into the lungs every 6 (six) hours as needed for wheezing or shortness of breath.  Marland Kitchen albuterol (PROVENTIL) (2.5 MG/3ML) 0.083% nebulizer solution Take 3 mLs (2.5 mg total) by nebulization every 6 (six) hours as needed for wheezing or shortness of breath.  . ALPRAZolam (XANAX) 0.5 MG tablet Take 1 tablet (0.5 mg total) by mouth 3 (three) times daily as needed for anxiety.  . ALPRAZolam (XANAX) 0.5 MG tablet Take 1 tablet (0.5 mg total) by mouth 3 (three) times daily as needed for sleep or anxiety.  Marland Kitchen amitriptyline (ELAVIL) 25 MG tablet Take 1 tablet (25 mg total) by mouth at bedtime.  . Ascorbic Acid (VITAMIN C) 100 MG tablet Take 100 mg by  mouth daily.  . calcium carbonate (OS-CAL) 1250 (500 Ca) MG chewable tablet Chew by mouth.  . carisoprodol (SOMA) 350 MG tablet Take 350 mg by mouth at bedtime. Patient states that she takes 1/2 tablet nightly  . Dentifrices (BIOTENE DRY MOUTH DT) by Transmucosal route.  . eletriptan (RELPAX) 40 MG tablet Take 40 mg by mouth as needed for migraine. may repeat in 2 hours if necessary   . EPINEPHRINE 0.3 mg/0.3 mL IJ SOAJ injection USE AS DIRECTED  . estradiol (CLIMARA - DOSED IN MG/24 HR) 0.1 mg/24hr patch Place 1 patch (0.1 mg total) onto the skin once a week.  . ezetimibe (ZETIA) 10 MG tablet Take 1 tablet (10 mg total) by mouth daily.  . famotidine (PEPCID) 10 MG tablet Take 20 mg by mouth.  . fenofibrate (TRICOR) 145 MG tablet Take 1 tablet (145 mg total) by mouth daily.  . fluticasone (FLONASE) 50 MCG/ACT nasal spray Place 2 sprays into both nostrils daily.  . fluticasone furoate-vilanterol (BREO ELLIPTA) 100-25 MCG/INH AEPB Inhale 1 puff into the lungs daily.  Marland Kitchen gabapentin (NEURONTIN) 600 MG tablet Take 1/2 (one-half) tablet by mouth twice daily  . HYDROcodone-acetaminophen (NORCO) 7.5-325 MG tablet Take 1-2 tablets by mouth every 6 (six) hours as needed for moderate pain.  . hydroxychloroquine (PLAQUENIL) 200 MG tablet Take by mouth 2 (two) times daily.  . hyoscyamine (NULEV) 0.125 MG TBDP Place 0.125 mg under the tongue every 4 (four) hours as needed for bladder spasms.   . Lactobacillus (DIGESTIVE HEALTH PROBIOTIC PO) Take 1 tablet by mouth daily.  Marland Kitchen levothyroxine (SYNTHROID)  137 MCG tablet Take 137 mcg by mouth every morning.  . Levothyroxine Sodium 137 MCG CAPS Take 1 capsule (137 mcg total) by mouth daily before breakfast.  . LINZESS 290 MCG CAPS capsule Take 290 mcg by mouth daily.  . Magnesium Oxide (NATRUL MAGNESIUM PO) Take by mouth.  . montelukast (SINGULAIR) 10 MG tablet TAKE ONE TABLET BY MOUTH ONCE DAILY FOR ASTHMA  . Multiple Vitamins-Minerals (HAIR SKIN AND NAILS FORMULA  PO) Take by mouth.  . Multiple Vitamins-Minerals (ZINC PO) Take by mouth.  . mupirocin ointment (BACTROBAN) 2 % as needed.   . pantoprazole (PROTONIX) 40 MG tablet Take 40 mg by mouth daily.  Vladimir Faster Glycol-Propyl Glycol 0.4-0.3 % SOLN Apply to eye.  . predniSONE (DELTASONE) 10 MG tablet 12 day taper.  17m on days 1 and 2, 565mon days 3 and 4, 4063mn days 5 and 6, 13m69m days 7 and 8, 20mg71mdays 9 and 10, 10 mg on days 11 and 12.  . PREMarland KitchenCRIPTION MEDICATION 1 Syringe by Subdermal route once a week. Allergy shot  . Probiotic Product (PHILLTiltonS Take by mouth.  . sodium chloride (OCEAN) 0.65 % SOLN nasal spray Place 1 spray into both nostrils as needed for congestion.  . sodium chloride 0.9 % nebulizer solution   . SUMAtriptan-naproxen (TREXIMET) 85-500 MG tablet daily as needed.   . Tuberculin-Allergy Syringes 28G X 1/2" 1 ML MISC 1 each by Does not apply route every 14 (fourteen) days. For allergy injections  . Vaginal Lubricant (REPLENS) GEL Place vaginally.  . venlafaxine XR (EFFEXOR-XR) 150 MG 24 hr capsule Take 2 capsules (300 mg total) by mouth at bedtime.  . Vitamin D, Ergocalciferol, (DRISDOL) 1.25 MG (50000 UNIT) CAPS capsule TAKE 1 CAPSULE (50,000 UNITS TOTAL) BY MOUTH EVERY 7 (SEVEN) DAYS. MONDAY  . vitamin E 1000 UNIT capsule Take by mouth.  . WhiDema Severinolatum-Mineral Oil (WH PECurtissOL-MINERAL OIL-LANOLIN) 0.1-0.1 % OINT    No facility-administered encounter medications on file as of 03/15/2020.    Surgical History: Past Surgical History:  Procedure Laterality Date  . ABDOMINAL HYSTERECTOMY  2006  . BLADDER REPAIR    . BREAST CYST ASPIRATION Left yrs ago  . CHOLECYSTECTOMY  1989  . COLONOSCOPY WITH ESOPHAGOGASTRODUODENOSCOPY (EGD)    . COLONOSCOPY WITH PROPOFOL N/A 08/03/2017   Procedure: COLONOSCOPY WITH PROPOFOL;  Surgeon: SkulsLollie Sails  [facility];  Service: Endoscopy;  Laterality: N/A;  . COLONOSCOPY WITH PROPOFOL N/A  10/18/2018   Procedure: COLONOSCOPY WITH PROPOFOL;  Surgeon: SkulsLollie Sails  [facility];  Service: Endoscopy;  Laterality: N/A;  . ESOPHAGOGASTRODUODENOSCOPY N/A 10/07/2013   Procedure: ESOPHAGOGASTRODUODENOSCOPY (EGD);  Surgeon: SandiDanie Binder  [facility];  Service: Endoscopy;  Laterality: N/A;  patient received heparin at 530am given phenergan 25mg 25m0 minutes before  . EYE SURGERY     lacrimal gland   . FLEXIBLE BRONCHOSCOPY N/A 06/04/2016   Procedure: FLEXIBLE BRONCHOSCOPY;  Surgeon: SaadatAllyne Gee Location: [facility];  Service: Pulmonary;  Laterality: N/A;  . INCONTINENCE SURGERY  2004  . NASAL SINUS SURGERY    . THYROID LOBECTOMY    . TOTAL THYROIDECTOMY      Medical History: Past Medical History:  Diagnosis Date  . Arthritis   . Asthma   . Asthmatic bronchitis   . BRCA negative 05/2014   MyRisk neg  . Celiac disease   . Celiac sprue   . Chronic  anxiety   . Complication of anesthesia    vomiting  . COPD (chronic obstructive pulmonary disease) ([place])   . Depression   . Environmental allergies   . Family history of breast cancer 05/2014   MyRisk neg; IBIS=29.5%  . Fibromyalgia    neuropathy all over  . Gastritis 10/08/2013  . GERD (gastroesophageal reflux disease)   . Hyperlipemia   . Hyperlipidemia    Intolerant to statins  . Hypothyroidism (acquired)   . Increased risk of breast cancer 05/2014   IBIS=29.5%  . Increased risk of breast cancer 05/2014   IBIS=29.5%  . Migraine   . Migraines   . 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 Pancreatitis   . Sjogren's disease ([place])   . Spastic colon   . Thyroid cancer ([place]) 1990 and 1994   Total thyroidectomy with radioactive iodine.     Family History: Family History  Problem Relation Age of Onset  . Arthritis/Rheumatoid Mother        died in age 68s  . Breast cancer Mother 12  . Breast cancer Maternal Grandmother        40's  . Breast cancer Maternal Aunt        X 2.  50's  . Lung cancer Maternal Aunt   . Pancreatitis Neg Hx   . Colon cancer Neg Hx     Social History: Social History   Socioeconomic History  . Marital status: Married    Spouse name: Not on file  . Number of children: 0  . Years of education: Not on file  . Highest education level: Not on file  Occupational History  . Not on file  Tobacco Use  . Smoking status: Never Smoker  . Smokeless tobacco: Never Used  Substance and Sexual Activity  . Alcohol use: No  . Drug use: No  . Sexual activity: Yes    Birth control/protection: Surgical  Other Topics Concern  . Not on file  Social History Narrative  . Not on file   Social Determinants of Health   Financial Resource Strain:   . Difficulty of Paying Living Expenses:   Food Insecurity:   . Worried About Charity fundraiser in the Last Year:   . Arboriculturist in the Last Year:   Transportation Needs:   . Film/video editor (Medical):   Marland Kitchen Lack of Transportation (Non-Medical):   Physical Activity:   . Days of Exercise per Week:   . Minutes of Exercise per Session:   Stress:   . Feeling of Stress :   Social Connections:   . Frequency of Communication with Friends and Family:   . Frequency of Social Gatherings with Friends and Family:   . Attends Religious Services:   . Active Member of Clubs or Organizations:   . Attends Archivist Meetings:   Marland Kitchen Marital Status:   Intimate Partner Violence:   . Fear of Current or Ex-Partner:   . Emotionally Abused:   Marland Kitchen Physically Abused:   . Sexually Abused:     Vital Signs: Blood pressure 110/75, pulse 77, temperature 97.7 F (36.5 C), resp. rate 16, height _0  (1.651 m), weight 188 lb 12.8 oz (85.6 kg), SpO2 97 %.  Examination: General Appearance: The patient is well-developed, well-nourished, and in no distress. Skin: Gross inspection of skin unremarkable. Head: normocephalic, no gross deformities. Eyes: no gross deformities noted. ENT: ears appear grossly  normal no exudates. Neck: Supple. No thyromegaly. No LAD. Respiratory: clear bilaterally.  Cardiovascular: Normal S1 and S2 without murmur or rub. Extremities: No cyanosis. pulses are equal. Neurologic: Alert and oriented. No involuntary movements.  LABS: Recent Results (from the past 2160 hour(s))  CBC with Differential/Platelet     Status: None   Collection Time: 01/31/20 10:55 AM  Result Value Ref Range   WBC 5.0 3.8 - 10.8 Thousand/uL   RBC 4.31 3.80 - 5.10 Million/uL   Hemoglobin 12.6 11.7 - 15.5 g/dL   HCT 38.4 35.0 - 45.0 %   MCV 89.1 80.0 - 100.0 fL   MCH 29.2 27.0 - 33.0 pg   MCHC 32.8 32.0 - 36.0 g/dL   RDW 12.5 11.0 - 15.0 %   Platelets 343 140 - 400 Thousand/uL   MPV 9.5 7.5 - 12.5 fL   Neutro Abs 2,470 1,500 - 7,800 cells/uL   Lymphs Abs 1,815 850 - 3,900 cells/uL   Absolute Monocytes 435 200 - 950 cells/uL   Eosinophils Absolute 230 15 - 500 cells/uL   Basophils Absolute 50 0 - 200 cells/uL   Neutrophils Relative % 49.4 %   Total Lymphocyte 36.3 %   Monocytes Relative 8.7 %   Eosinophils Relative 4.6 %   Basophils Relative 1.0 %  COMPLETE METABOLIC PANEL WITH GFR     Status: None   Collection Time: 01/31/20 10:55 AM  Result Value Ref Range   Glucose, Bld 87 65 - 99 mg/dL    Comment: .            Fasting reference interval .    BUN 13 7 - 25 mg/dL   Creat 0.90 0.50 - 0.99 mg/dL    Comment: For patients >18 years of age, the reference limit for Creatinine is approximately 13% higher for people identified as African-American. .    GFR, Est Non African American 68 > OR = 60 mL/min/1.59m   GFR, Est African American 78 > OR = 60 mL/min/1.758m  BUN/Creatinine Ratio NOT APPLICABLE 6 - 22 (calc)   Sodium 139 135 - 146 mmol/L   Potassium 4.3 3.5 - 5.3 mmol/L   Chloride 106 98 - 110 mmol/L   CO2 25 20 - 32 mmol/L   Calcium 9.7 8.6 - 10.4 mg/dL   Total Protein 7.1 6.1 - 8.1 g/dL   Albumin 4.3 3.6 - 5.1 g/dL   Globulin 2.8 1.9 - 3.7 g/dL (calc)   AG Ratio  1.5 1.0 - 2.5 (calc)   Total Bilirubin 0.3 0.2 - 1.2 mg/dL   Alkaline phosphatase (APISO) 59 37 - 153 U/L   AST 18 10 - 35 U/L   ALT 19 6 - 29 U/L  Urinalysis, Routine w reflex microscopic     Status: None   Collection Time: 01/31/20 10:55 AM  Result Value Ref Range   Color, Urine YELLOW YELLOW   APPearance CLEAR CLEAR   Specific Gravity, Urine 1.009 1.001 - 1.03   pH 7.0 5.0 - 8.0   Glucose, UA NEGATIVE NEGATIVE   Bilirubin Urine NEGATIVE NEGATIVE   Ketones, ur NEGATIVE NEGATIVE   Hgb urine dipstick NEGATIVE NEGATIVE   Protein, ur NEGATIVE NEGATIVE   Nitrite NEGATIVE NEGATIVE   Leukocytes,Ua NEGATIVE NEGATIVE  Sedimentation rate     Status: None   Collection Time: 01/31/20 10:55 AM  Result Value Ref Range   Sed Rate 11 0 - 30 mm/h  CK     Status: None   Collection Time: 01/31/20 10:55 AM  Result Value Ref Range   Total CK 44 29 -  143 U/L  Rheumatoid factor     Status: None   Collection Time: 01/31/20 10:55 AM  Result Value Ref Range   Rhuematoid fact SerPl-aCnc <20 <35 IU/mL  Cyclic citrul peptide antibody, IgG     Status: None   Collection Time: 01/31/20 10:55 AM  Result Value Ref Range   Cyclic Citrullin Peptide Ab <16 UNITS    Comment: Reference Range Negative:            <20 Weak Positive:       20-39 Moderate Positive:   40-59 Strong Positive:     >59 .   ANA     Status: Abnormal   Collection Time: 01/31/20 10:55 AM  Result Value Ref Range   Anti Nuclear Antibody (ANA) POSITIVE (A) NEGATIVE    Comment: ANA IFA is a first line screen for detecting the presence of up to approximately 150 autoantibodies in various autoimmune diseases. A positive ANA IFA result is suggestive of autoimmune disease and reflexes to titer and pattern. Further laboratory testing may be considered if clinically indicated. . For additional information, please refer to http://education.[facility].com/faq/FAQ177 (This link is being provided for informational/ educational  purposes only.) .   RNP Antibody     Status: None   Collection Time: 01/31/20 10:55 AM  Result Value Ref Range   Ribonucleic Protein(ENA) Antibody, IgG <1.0 NEG <1.0 NEG AI  Anti-Smith antibody     Status: None   Collection Time: 01/31/20 10:55 AM  Result Value Ref Range   ENA SM Ab Ser-aCnc <1.0 NEG <1.0 NEG AI  Sjogrens syndrome-A extractable nuclear antibody     Status: None   Collection Time: 01/31/20 10:55 AM  Result Value Ref Range   SSA (Ro) (ENA) Antibody, IgG <1.0 NEG <1.0 NEG AI  Sjogrens syndrome-B extractable nuclear antibody     Status: None   Collection Time: 01/31/20 10:55 AM  Result Value Ref Range   SSB ([place]) (ENA) Antibody, IgG <1.0 NEG <1.0 NEG AI  Anti-DNA antibody, double-stranded     Status: None   Collection Time: 01/31/20 10:55 AM  Result Value Ref Range   ds DNA Ab <1 IU/mL    Comment:                            IU/mL       Interpretation                            < or = 4    Negative                            5-9         Indeterminate                            > or = 10   Positive .   C3 and C4     Status: None   Collection Time: 01/31/20 10:55 AM  Result Value Ref Range   C3 Complement 171 83 - 193 mg/dL   C4 Complement 28 15 - 57 mg/dL  Beta-2 glycoprotein antibodies     Status: None   Collection Time: 01/31/20 10:55 AM  Result Value Ref Range   Beta-2 Glyco I IgG <9 < OR = 20 SGU   Beta-2 Glyco 1 IgM 9 < OR =  20 SMU   Beta-2 Glyco 1 IgA <9 < OR = 20 SAU  Cardiolipin antibodies, IgG, IgM, IgA     Status: None   Collection Time: 01/31/20 10:55 AM  Result Value Ref Range   Anticardiolipin IgA <11 APL    Comment:     Value       Interpretation     -----       --------------     < or = 11   Negative     12 - 20     Indeterminate     21 - 80     Low to Medium Positive     >80         High Positive      Anticardiolipin IgG <14 GPL    Comment:     Value       Interpretation     -----       --------------     < or = 14   Negative     15 -  20     Indeterminate     21 - 80     Low to Medium Positive     >80         High Positive    Anticardiolipin IgM <12 MPL    Comment:     Value       Interpretation     -----       --------------     < or = 12   Negative     13 - 20     Indeterminate     21 - 80     Low to Medium Positive     >80         High Positive . The antiphospholipid antibody syndrome (APS) is a  clinical-pathologic correlation that includes a  clinical event (e.g. thrombosis, pregnancy loss,  thrombocytopenia) and persistent positive  antiphospholipid antibodies  (IgM or IgG ACA >40 MPL/GPL, IgM or IgG anti-b2GPI antibodies or a lupus anticoagulant). International  consensus guidelines for APS suggest waiting at least  12 weeks before retesting to confirm antibody  persistence.  The Systemic Lupus International  Collaborating Clinics immunological classification  criteria for systemic lupus erythematosus (SLE)  include testing for isotype IgA, which has yet to be  incorporated into APS criteria. Low level  antiphospholipid antibodies may sometimes be detected  in the setting of infection, drug therapy or aging.   Lupus Anticoagulant Eval w/Reflex     Status: None   Collection Time: 01/31/20 10:55 AM  Result Value Ref Range   Lupus Anticoagulant see note     Comment: A Lupus Anticoagulant is not detected. Marland Kitchen Reference Range:  Not Detected . For additional information, please refer to http://education.[facility].com/faq/FAQ01v2 . (This link is being provided for informational/ educational purposes only.) . Marland Kitchen This interpretation is based on the following test results. Marland Kitchen    PTT-LA Screen 34 <=40 sec   dRVVT 39 <=45 sec   PT, Mixing Interp Not Indicated   Anti-nuclear ab-titer (ANA titer)     Status: Abnormal   Collection Time: 01/31/20 10:55 AM  Result Value Ref Range   ANA Titer 1 1:320 (H) titer    Comment:                 Reference Range                 <1:40        Negative  1:40-1:80    Low Antibody Level                 >1:80        Elevated Antibody Level .    ANA Pattern 1 Nuclear, Homogeneous (A)     Comment: Homogeneous pattern is associated with systemic lupus erythematosus (SLE), drug-induced lupus and juvenile idiopathic arthritis. . AC-1: Homogeneous . International Consensus on ANA Patterns (https://www.hernandez-brewer.com/)     Radiology: DG Wrist Complete Left  Result Date: 10/26/2019 CLINICAL DATA:  Bilateral hand and wrist pain. EXAM: LEFT WRIST - COMPLETE 3+ VIEW COMPARISON:  None. FINDINGS: Advanced osteoarthritis changes at the 1st carpometacarpal joint with joint space narrowing, spurring. No bony erosions. No acute bony abnormality. Specifically, no fracture, subluxation, or dislocation. IMPRESSION: Osteoarthritis changes most pronounced at the 1st carpometacarpal joint. No acute bony abnormality. Electronically Signed   By: Rolm Baptise M.D.   On: 10/26/2019 11:56   DG Wrist Complete Right  Result Date: 10/26/2019 CLINICAL DATA:  Chronic bilateral hand and wrist pain. EXAM: RIGHT WRIST - COMPLETE 3+ VIEW COMPARISON:  Right hand today FINDINGS: Advanced osteoarthritis changes at the 1st carpometacarpal joint with joint space narrowing and spurring. Remainder the joint spaces are maintained. No bony erosions. No acute bony abnormality. Specifically, no fracture, subluxation, or dislocation. IMPRESSION: Advanced osteoarthritis changes of the 1st carpometacarpal joint. No acute bony abnormality. Electronically Signed   By: Rolm Baptise M.D.   On: 10/26/2019 11:57   DG Hand Complete Left  Result Date: 10/26/2019 CLINICAL DATA:  Bilateral hand and wrist pain for several weeks EXAM: LEFT HAND - COMPLETE 3+ VIEW COMPARISON:  Wrist series today FINDINGS: Advanced osteoarthritis changes at the 1st carpometacarpal joint with joint space narrowing, spurring. Early osteoarthritis changes in the IP joints with joint space narrowing and  early spurring. No bony erosions. No acute bony abnormality. Specifically, no fracture, subluxation, or dislocation. IMPRESSION: Osteoarthritis changes most pronounced at the 1st carpometacarpal joint. No acute bony abnormality. Electronically Signed   By: Rolm Baptise M.D.   On: 10/26/2019 11:55   DG Hand Complete Right  Result Date: 10/26/2019 CLINICAL DATA:  Bilateral hand and wrist pain for weeks. EXAM: RIGHT HAND - COMPLETE 3+ VIEW COMPARISON:  Right wrist today FINDINGS: Advanced osteoarthritis at the 1st carpometacarpal joint with joint space narrowing and spurring. Early joint space narrowing and spurring in the IP joints. No acute bony abnormality. Specifically, no fracture, subluxation, or dislocation. IMPRESSION: Osteoarthritis changes in the right hand and wrist, most pronounced at the 1st carpometacarpal joint. No acute bony abnormality. Electronically Signed   By: Rolm Baptise M.D.   On: 10/26/2019 11:55    No results found.  No results found.    Assessment and Plan: Patient Active Problem List   Diagnosis Date Noted  . Chronic wrist pain, right 11/02/2019  . Chronic pain of left wrist 11/02/2019  . Pain in joints of right hand 11/02/2019  . Pain in joint of left hand 11/02/2019  . Primary osteoarthritis of both hands 11/02/2019  . 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 Dysuria 04/03/2019  . Generalized anxiety disorder 02/28/2019  . Chronic pain of both knees 03/01/2018  . Encounter for general adult medical examination with abnormal findings 03/01/2018  . Primary osteoarthritis of both first carpometacarpal joints 03/01/2018  . Chronic obstructive pulmonary disease, unspecified ([place]) 12/04/2017  . Shortness of breath 12/04/2017  . Allergic rhinitis, unspecified 12/04/2017  . 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 Vaginal atrophy 09/01/2017  . Chronic right-sided low back pain  without sciatica 01/05/2017  . Primary Sjogren's syndrome ([place]) 01/05/2017  . CD (celiac disease) 04/17/2015  . Gastroesophageal reflux disease with esophagitis 04/17/2015  . Hx of Sjogren's disease ([place]) 04/17/2015  . Acute asthma exacerbation 10/14/2013  . Acute respiratory failure ([place]) 10/14/2013  . Gastritis 10/08/2013  . Bradycardia 10/07/2013  . Esophageal spasm 10/06/2013  . Substernal precordial chest pain 10/06/2013  . Chronic anxiety 10/06/2013  . GERD (gastroesophageal reflux disease) 10/06/2013  . Chronic asthma 10/06/2013  . 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 Depression with anxiety 10/06/2013  . Fibromyalgia 10/06/2013  . Hypothyroidism 10/06/2013  . Chronic migraine 10/06/2013    1. OSA (obstructive sleep apnea) Pt will have titration study at this time to determine appropriate pressure.  - Cpap titration; Future  2. Mild intermittent chronic asthma without complication Stable, continue with current medications.   3. Gastroesophageal reflux disease without esophagitis Controlled, continue current meds.   4. Shortness of breath - Spirometry with Graph  General Counseling: I have discussed the findings of the evaluation and examination with Susan [name].  I have also discussed any further diagnostic evaluation thatmay be needed or ordered today. Susan [name] verbalizes understanding of the findings of todays visit. We also reviewed her medications today and discussed drug interactions and side effects including but not limited excessive drowsiness and altered mental states. We also discussed that there is always a risk not just to her but also people around her. she has been encouraged to call the office with any questions or concerns that should arise related to todays visit.  Orders Placed This Encounter  Procedures  . Spirometry with Graph    Order Specific Question:   Where should this test be performed?    Answer:   [facility] [place]     Time spent: 30 This  patient was seen by Orson Gear AGNP-C in Collaboration with Dr. Devona Konig as a part of collaborative care agreement.   I have personally obtained a history, examined the patient, evaluated laboratory and imaging results, formulated the assessment and plan and placed orders.    Allyne Gee, MD [facility] Pulmonary and Critical Care Sleep medicine

## 2020-03-18 ENCOUNTER — Encounter: Payer: Self-pay | Admitting: Dermatology

## 2020-03-20 ENCOUNTER — Telehealth: Payer: Self-pay

## 2020-03-20 NOTE — Telephone Encounter (Signed)
Confirmed and screened for 03-22-20 ov.

## 2020-03-22 ENCOUNTER — Ambulatory Visit (INDEPENDENT_AMBULATORY_CARE_PROVIDER_SITE_OTHER): Payer: Medicare HMO | Admitting: Nurse Practitioner

## 2020-03-22 ENCOUNTER — Other Ambulatory Visit: Payer: Self-pay

## 2020-03-22 ENCOUNTER — Encounter: Payer: Self-pay | Admitting: Nurse Practitioner

## 2020-03-22 VITALS — BP 127/83 | HR 76 | Temp 97.7°F | Resp 16 | Ht 65.0 in | Wt 187.6 lb

## 2020-03-22 DIAGNOSIS — J4520 Mild intermittent asthma, uncomplicated: Secondary | ICD-10-CM

## 2020-03-22 DIAGNOSIS — M19041 Primary osteoarthritis, right hand: Secondary | ICD-10-CM

## 2020-03-22 DIAGNOSIS — Z0001 Encounter for general adult medical examination with abnormal findings: Secondary | ICD-10-CM | POA: Diagnosis not present

## 2020-03-22 DIAGNOSIS — M19042 Primary osteoarthritis, left hand: Secondary | ICD-10-CM

## 2020-03-22 DIAGNOSIS — R300 Dysuria: Secondary | ICD-10-CM

## 2020-03-22 DIAGNOSIS — E039 Hypothyroidism, unspecified: Secondary | ICD-10-CM

## 2020-03-22 DIAGNOSIS — G4733 Obstructive sleep apnea (adult) (pediatric): Secondary | ICD-10-CM | POA: Diagnosis not present

## 2020-03-22 MED ORDER — HYDROCODONE-ACETAMINOPHEN 7.5-325 MG PO TABS: 1.0000 | ORAL_TABLET | Freq: Four times a day (QID) | ORAL | 0 refills | Status: DC | PRN

## 2020-03-22 NOTE — Progress Notes (Signed)
[facility] [place], [place] [address]  Internal MEDICINE  Office Visit Note  Patient Name: Susan [name]  [id number]  [id number]  Date of Service: 03/26/2020   Pt is here for routine health maintenance examination  Chief Complaint  Patient presents with  . Medicare Wellness  . Hypothyroidism  . Gastroesophageal Reflux  . Hyperlipidemia  . Asthma  . Anxiety  . Depression  . COPD  . Arthritis  . Quality Metric Gaps    PNA vaccine, DEXA scan  . Referral    vein and vascular for varicose veins     The patient is here for health maintenance exam. She states that she has been having a lot of trouble with severe osteoarthritis in her hands, ankles, knees, and her back. She is seeing a new rheumatologist. She is given short term prescription for pain medication. Hydrocodone/APAP #20 tablets is given to last for approximately six months. The last time she was given a prescription from this office was 10/14/2019 for #20 tablets. She has not had another prescription for them. She states that she only takes these when she has severe pain and when she is travelling. She and her husband travel between home here and home in [place].  Her blood pressure is well managed. She does see pulmonology due to problems with sleep and low oxygen saturations. She is due to have routine, fasting labs done. Screening mammogram was done 09/2019 and was negative. She did have both Moderna COVID 19 vaccines and did well with them.  shedoes see GYN provider every year for pap smear and breast exam.     Current Medication: Outpatient Encounter Medications as of 03/22/2020  Medication Sig  . acetaminophen (TYLENOL) 500 MG tablet Take by mouth.  Marland Kitchen albuterol (PROVENTIL HFA;VENTOLIN HFA) 108 (90 Base) MCG/ACT inhaler Inhale 2 puffs into the lungs every 6 (six) hours as needed for wheezing or shortness of breath.  Marland Kitchen albuterol (PROVENTIL) (2.5 MG/3ML) 0.083% nebulizer solution Take 3  mLs (2.5 mg total) by nebulization every 6 (six) hours as needed for wheezing or shortness of breath.  . ALPRAZolam (XANAX) 0.5 MG tablet Take 1 tablet (0.5 mg total) by mouth 3 (three) times daily as needed for anxiety.  . ALPRAZolam (XANAX) 0.5 MG tablet Take 1 tablet (0.5 mg total) by mouth 3 (three) times daily as needed for sleep or anxiety.  Marland Kitchen amitriptyline (ELAVIL) 25 MG tablet Take 1 tablet (25 mg total) by mouth at bedtime.  . Ascorbic Acid (VITAMIN C) 100 MG tablet Take 100 mg by mouth daily.  . calcium carbonate (OS-CAL) 1250 (500 Ca) MG chewable tablet Chew by mouth.  . carisoprodol (SOMA) 350 MG tablet Take 350 mg by mouth at bedtime. Patient states that she takes 1/2 tablet nightly  . Dentifrices (BIOTENE DRY MOUTH DT) by Transmucosal route.  . doxycycline (VIBRAMYCIN) 50 MG capsule Take one cap po QD-BID. Take with food.  . eletriptan (RELPAX) 40 MG tablet Take 40 mg by mouth as needed for migraine. may repeat in 2 hours if necessary   . EPINEPHRINE 0.3 mg/0.3 mL IJ SOAJ injection USE AS DIRECTED  . estradiol (CLIMARA - DOSED IN MG/24 HR) 0.1 mg/24hr patch Place 1 patch (0.1 mg total) onto the skin once a week.  . ezetimibe (ZETIA) 10 MG tablet Take 1 tablet (10 mg total) by mouth daily.  . famotidine (PEPCID) 10 MG tablet Take 20 mg by mouth.  . fenofibrate (TRICOR) 145 MG tablet Take 1  tablet (145 mg total) by mouth daily.  . fluticasone (FLONASE) 50 MCG/ACT nasal spray Place 2 sprays into both nostrils daily.  . fluticasone furoate-vilanterol (BREO ELLIPTA) 100-25 MCG/INH AEPB Inhale 1 puff into the lungs daily.  Marland Kitchen gabapentin (NEURONTIN) 600 MG tablet Take 1/2 (one-half) tablet by mouth twice daily  . HYDROcodone-acetaminophen (NORCO) 7.5-325 MG tablet Take 1-2 tablets by mouth every 6 (six) hours as needed for moderate pain.  . hydroxychloroquine (PLAQUENIL) 200 MG tablet Take by mouth 2 (two) times daily.  . hyoscyamine (NULEV) 0.125 MG TBDP Place 0.125 mg under the tongue  every 4 (four) hours as needed for bladder spasms.   . Lactobacillus (DIGESTIVE HEALTH PROBIOTIC PO) Take 1 tablet by mouth daily.  Marland Kitchen LINZESS 290 MCG CAPS capsule Take 290 mcg by mouth daily.  . Magnesium Oxide (NATRUL MAGNESIUM PO) Take by mouth.  . montelukast (SINGULAIR) 10 MG tablet TAKE ONE TABLET BY MOUTH ONCE DAILY FOR ASTHMA  . Multiple Vitamins-Minerals (HAIR SKIN AND NAILS FORMULA PO) Take by mouth.  . mupirocin ointment (BACTROBAN) 2 % as needed.   . pantoprazole (PROTONIX) 40 MG tablet Take 40 mg by mouth daily.  Vladimir Faster Glycol-Propyl Glycol 0.4-0.3 % SOLN Apply to eye.  Marland Kitchen PRESCRIPTION MEDICATION 1 Syringe by Subdermal route once a week. Allergy shot  . Probiotic Product ([place]) CAPS Take by mouth.  . sodium chloride (OCEAN) 0.65 % SOLN nasal spray Place 1 spray into both nostrils as needed for congestion.  . sodium chloride 0.9 % nebulizer solution   . SUMAtriptan-naproxen (TREXIMET) 85-500 MG tablet daily as needed.   . Tuberculin-Allergy Syringes 28G X 1/2" 1 ML MISC 1 each by Does not apply route every 14 (fourteen) days. For allergy injections  . Vaginal Lubricant (REPLENS) GEL Place vaginally.  . venlafaxine XR (EFFEXOR-XR) 150 MG 24 hr capsule Take 2 capsules (300 mg total) by mouth at bedtime.  . Vitamin D, Ergocalciferol, (DRISDOL) 1.25 MG (50000 UNIT) CAPS capsule TAKE 1 CAPSULE (50,000 UNITS TOTAL) BY MOUTH EVERY 7 (SEVEN) DAYS. MONDAY  . vitamin E 1000 UNIT capsule Take by mouth.  Marland Kitchen White Petrolatum-Mineral Oil ([place] PETROL-MINERAL OIL-LANOLIN) 0.1-0.1 % OINT   . [DISCONTINUED] HYDROcodone-acetaminophen (NORCO) 7.5-325 MG tablet Take 1-2 tablets by mouth every 6 (six) hours as needed for moderate pain.  . [DISCONTINUED] levothyroxine (SYNTHROID) 137 MCG tablet Take 137 mcg by mouth every morning.  . [DISCONTINUED] Levothyroxine Sodium 137 MCG CAPS Take 1 capsule (137 mcg total) by mouth daily before breakfast.  . [DISCONTINUED] Multiple  Vitamins-Minerals (ZINC PO) Take by mouth.  . [DISCONTINUED] predniSONE (DELTASONE) 10 MG tablet 12 day taper.  1m on days 1 and 2, 537mon days 3 and 4, 4052mn days 5 and 6, 80m44m days 7 and 8, 20mg80mdays 9 and 10, 10 mg on days 11 and 12. (Patient not taking: Reported on 03/22/2020)   No facility-administered encounter medications on file as of 03/22/2020.    Surgical History: Past Surgical History:  Procedure Laterality Date  . ABDOMINAL HYSTERECTOMY  2006  . BLADDER REPAIR    . BREAST CYST ASPIRATION Left yrs ago  . CHOLECYSTECTOMY  1989  . COLONOSCOPY WITH ESOPHAGOGASTRODUODENOSCOPY (EGD)    . COLONOSCOPY WITH PROPOFOL N/A 08/03/2017   Procedure: COLONOSCOPY WITH PROPOFOL;  Surgeon: SkulsLollie Sails  [facility];  Service: Endoscopy;  Laterality: N/A;  . COLONOSCOPY WITH PROPOFOL N/A 10/18/2018   Procedure: COLONOSCOPY WITH PROPOFOL;  Surgeon: SkulsLollie Sails  MD;  Location: [facility] ENDOSCOPY;  Service: Endoscopy;  Laterality: N/A;  . ESOPHAGOGASTRODUODENOSCOPY N/A 10/07/2013   Procedure: ESOPHAGOGASTRODUODENOSCOPY (EGD);  Surgeon: Danie Binder, MD;  [facility];  Service: Endoscopy;  Laterality: N/A;  patient received heparin at 530am given phenergan 55m IV 30 minutes before  . EYE SURGERY     lacrimal gland   . FLEXIBLE BRONCHOSCOPY N/A 06/04/2016   Procedure: FLEXIBLE BRONCHOSCOPY;  Surgeon: SAllyne Gee MD;  Location: [facility] ORS;  Service: Pulmonary;  Laterality: N/A;  . INCONTINENCE SURGERY  2004  . NASAL SINUS SURGERY    . THYROID LOBECTOMY    . TOTAL THYROIDECTOMY      Medical History: Past Medical History:  Diagnosis Date  . Arthritis   . Asthma   . Asthmatic bronchitis   . BRCA negative 05/2014   MyRisk neg  . Celiac disease   . Celiac sprue   . Chronic anxiety   . Complication of anesthesia    vomiting  . COPD [place] obstructive [place] disease) [place]   . Depression   . Environmental allergies   . Family history of  breast cancer 05/2014   MyRisk neg; IBIS=29.5%  . Fibromyalgia    neuropathy all over  . Gastritis 10/08/2013  . GERD (gastroesophageal reflux disease)   . Hyperlipemia   . Hyperlipidemia    Intolerant to statins  . Hypothyroidism (acquired)   . Increased risk of breast cancer 05/2014   IBIS=29.5%  . Increased risk of breast cancer 05/2014   IBIS=29.5%  . Migraine   . Migraines   . 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 Pancreatitis   . [place]'s disease [place]   . Spastic colon   . [place]   Total thyroidectomy with radioactive iodine.     Family History: Family History  Problem Relation Age of Onset  . Arthritis/Rheumatoid Mother        died in age 3622s . Breast cancer Mother 467 . Breast cancer Maternal Grandmother        40's  . Breast cancer Maternal Aunt        X 2. 50's  . Lung cancer Maternal Aunt   . Pancreatitis Neg Hx   . Colon cancer Neg Hx       Review of Systems  Constitutional: Positive for fatigue. Negative for chills and unexpected weight change.  HENT: Negative for congestion, nosebleeds, rhinorrhea, sneezing and sore throat.   Respiratory: Negative for cough, chest tightness and shortness of breath.   Cardiovascular: Negative for chest pain and palpitations.  Gastrointestinal: Negative for abdominal pain, constipation, diarrhea, nausea and vomiting.  Endocrine: Negative for cold intolerance, heat intolerance, polydipsia and polyuria.  Genitourinary: Negative for difficulty urinating, frequency and urgency.  Musculoskeletal: Positive for myalgias. Negative for arthralgias, back pain, joint swelling and neck pain.       Bilateral hand pain and swelling. This is limiting her activities of daily living as well as ability to sleep well during the night.   Skin: Negative for rash.  Allergic/Immunologic: Positive for environmental allergies.  Neurological: Negative for dizziness, tremors, numbness and headaches.  Hematological:  Negative for adenopathy. Does not bruise/bleed easily.  Psychiatric/Behavioral: Negative for behavioral problems and sleep disturbance. The patient is nervous/anxious.     Today's Vitals   03/22/20 0947  BP: 127/83  Pulse: 76  Resp: 16  Temp: 97.7 F (36.5 C)  SpO2: 98%  Weight: 187 lb 9.6 oz (85.1 kg)  Height:  5' 5"  (1.651 m)   Body mass index is 31.22 kg/m.  Physical Exam Vitals and nursing note reviewed.  Constitutional:      General: She is not in acute distress.    Appearance: Normal appearance. She is well-developed. She is not diaphoretic.  HENT:     Head: Normocephalic and atraumatic.     Nose: Nose normal.     Mouth/Throat:     Pharynx: No oropharyngeal exudate.  Eyes:     Pupils: Pupils are equal, round, and reactive to light.  Neck:     Thyroid: No thyromegaly.     Vascular: No carotid bruit or JVD.     Trachea: No tracheal deviation.  Cardiovascular:     Rate and Rhythm: Normal rate and regular rhythm.     Heart sounds: Normal heart sounds. No murmur. No friction rub. No gallop.   Pulmonary:     Effort: Pulmonary effort is normal. No respiratory distress.     Breath sounds: Normal breath sounds. No wheezing or rales.  Chest:     Chest wall: No tenderness.  Abdominal:     General: Bowel sounds are normal.     Palpations: Abdomen is soft.     Tenderness: There is no abdominal tenderness.  Musculoskeletal:        General: Normal range of motion.     Cervical back: Normal range of motion and neck supple.     Comments: There is joint pain and swelling in the wrists and multiple joints of the fingers. ROM and strength are both limited due to pain and swelling.   Lymphadenopathy:     Cervical: No cervical adenopathy.  Skin:    General: Skin is warm and dry.  Neurological:     General: No focal deficit present.     Mental Status: She is alert and oriented to person, place, and time.     Cranial Nerves: No cranial nerve deficit.  Psychiatric:        Mood  and Affect: Mood normal.        Behavior: Behavior normal.        Thought Content: Thought content normal.        Judgment: Judgment normal.    Depression screen [facility] 2/9 03/22/2020 10/14/2019 07/14/2019 03/22/2019 02/07/2019  Decreased Interest 0 0 0 0 0  Down, Depressed, Hopeless 0 0 0 0 0  PHQ - 2 Score 0 0 0 0 0    Functional Status Survey: Is the patient deaf or have difficulty hearing?: No Does the patient have difficulty seeing, even when wearing glasses/contacts?: No Does the patient have difficulty concentrating, remembering, or making decisions?: Yes Does the patient have difficulty walking or climbing stairs?: Yes(shortness of breath climbing stairs) Does the patient have difficulty dressing or bathing?: Yes(severe osteoarthritis) Does the patient have difficulty doing errands alone such as visiting a doctor's office or shopping?: No  MMSE - [place] Exam 03/22/2020 03/22/2019  Orientation to time 5 5  Orientation to Place 5 5  Registration 3 3  Attention/ Calculation 5 5  Recall 3 3  Language- name 2 objects 2 2  Language- repeat 1 1  Language- follow 3 step command 3 3  Language- read & follow direction 1 1  Write a sentence 1 1  Copy design 1 1  Total score 30 30    Fall Risk  03/22/2020 10/14/2019 07/14/2019 03/22/2019 02/28/2019  Falls in the past year? 1 0 1 1 1   Number falls  in past yr: 1 0 1 1 0  Injury with Fall? 0 0 0 0 0      LABS: Recent Results (from the past 2160 hour(s))  CBC with Differential/Platelet     Status: None   Collection Time: 01/31/20 10:55 AM  Result Value Ref Range   WBC 5.0 3.8 - 10.8 Thousand/uL   RBC 4.31 3.80 - 5.10 Million/uL   Hemoglobin 12.6 11.7 - 15.5 g/dL   HCT 38.4 35.0 - 45.0 %   MCV 89.1 80.0 - 100.0 fL   MCH 29.2 27.0 - 33.0 pg   MCHC 32.8 32.0 - 36.0 g/dL   RDW 12.5 11.0 - 15.0 %   Platelets 343 140 - 400 Thousand/uL   MPV 9.5 7.5 - 12.5 fL   Neutro Abs 2,470 1,500 - 7,800 cells/uL   Lymphs Abs 1,815 850 -  3,900 cells/uL   Absolute Monocytes 435 200 - 950 cells/uL   Eosinophils Absolute 230 15 - 500 cells/uL   Basophils Absolute 50 0 - 200 cells/uL   Neutrophils Relative % 49.4 %   Total Lymphocyte 36.3 %   Monocytes Relative 8.7 %   Eosinophils Relative 4.6 %   Basophils Relative 1.0 %  COMPLETE METABOLIC PANEL WITH GFR     Status: None   Collection Time: 01/31/20 10:55 AM  Result Value Ref Range   Glucose, Bld 87 65 - 99 mg/dL    Comment: .            Fasting reference interval .    BUN 13 7 - 25 mg/dL   Creat 0.90 0.50 - 0.99 mg/dL    Comment: For patients >43 years of age, the reference limit for Creatinine is approximately 13% higher for people identified as African-American. .    GFR, Est Non African American 68 > OR = 60 mL/min/1.64m   GFR, Est African American 78 > OR = 60 mL/min/1.747m  BUN/Creatinine Ratio NOT APPLICABLE 6 - 22 (calc)   Sodium 139 135 - 146 mmol/L   Potassium 4.3 3.5 - 5.3 mmol/L   Chloride 106 98 - 110 mmol/L   CO2 25 20 - 32 mmol/L   Calcium 9.7 8.6 - 10.4 mg/dL   Total Protein 7.1 6.1 - 8.1 g/dL   Albumin 4.3 3.6 - 5.1 g/dL   Globulin 2.8 1.9 - 3.7 g/dL (calc)   AG Ratio 1.5 1.0 - 2.5 (calc)   Total Bilirubin 0.3 0.2 - 1.2 mg/dL   Alkaline phosphatase (APISO) 59 37 - 153 U/L   AST 18 10 - 35 U/L   ALT 19 6 - 29 U/L  Urinalysis, Routine w reflex microscopic     Status: None   Collection Time: 01/31/20 10:55 AM  Result Value Ref Range   Color, Urine YELLOW YELLOW   APPearance CLEAR CLEAR   Specific Gravity, Urine 1.009 1.001 - 1.03   pH 7.0 5.0 - 8.0   Glucose, UA NEGATIVE NEGATIVE   Bilirubin Urine NEGATIVE NEGATIVE   Ketones, ur NEGATIVE NEGATIVE   Hgb urine dipstick NEGATIVE NEGATIVE   Protein, ur NEGATIVE NEGATIVE   Nitrite NEGATIVE NEGATIVE   Leukocytes,Ua NEGATIVE NEGATIVE  Sedimentation rate     Status: None   Collection Time: 01/31/20 10:55 AM  Result Value Ref Range   Sed Rate 11 0 - 30 mm/h  CK     Status: None    Collection Time: 01/31/20 10:55 AM  Result Value Ref Range   Total CK 44 29 -  143 U/L  Rheumatoid factor     Status: None   Collection Time: 01/31/20 10:55 AM  Result Value Ref Range   Rhuematoid fact SerPl-aCnc <36 <64 IU/mL  Cyclic citrul peptide antibody, IgG     Status: None   Collection Time: 01/31/20 10:55 AM  Result Value Ref Range   Cyclic Citrullin Peptide Ab <16 UNITS    Comment: Reference Range Negative:            <20 Weak Positive:       20-39 Moderate Positive:   40-59 Strong Positive:     >59 .   ANA     Status: Abnormal   Collection Time: 01/31/20 10:55 AM  Result Value Ref Range   Anti Nuclear Antibody (ANA) POSITIVE (A) NEGATIVE    Comment: ANA IFA is a first line screen for detecting the presence of up to approximately 150 autoantibodies in various autoimmune diseases. A positive ANA IFA result is suggestive of autoimmune disease and reflexes to titer and pattern. Further laboratory testing may be considered if clinically indicated. . For additional information, please refer to http://education.[facility].com/faq/FAQ177 (This link is being provided for informational/ educational purposes only.) .   RNP Antibody     Status: None   Collection Time: 01/31/20 10:55 AM  Result Value Ref Range   Ribonucleic Protein(ENA) Antibody, IgG <1.0 NEG <1.0 NEG AI  Anti-Smith antibody     Status: None   Collection Time: 01/31/20 10:55 AM  Result Value Ref Range   ENA SM Ab Ser-aCnc <1.0 NEG <1.0 NEG AI  Sjogrens syndrome-A extractable nuclear antibody     Status: None   Collection Time: 01/31/20 10:55 AM  Result Value Ref Range   SSA (Ro) (ENA) Antibody, IgG <1.0 NEG <1.0 NEG AI  Sjogrens syndrome-B extractable nuclear antibody     Status: None   Collection Time: 01/31/20 10:55 AM  Result Value Ref Range   SSB (La) (ENA) Antibody, IgG <1.0 NEG <1.0 NEG AI  Anti-DNA antibody, double-stranded     Status: None   Collection Time: 01/31/20 10:55 AM  Result  Value Ref Range   ds DNA Ab <1 IU/mL    Comment:                            IU/mL       Interpretation                            < or = 4    Negative                            5-9         Indeterminate                            > or = 10   Positive .   C3 and C4     Status: None   Collection Time: 01/31/20 10:55 AM  Result Value Ref Range   C3 Complement 171 83 - 193 mg/dL   C4 Complement 28 15 - 57 mg/dL  Beta-2 glycoprotein antibodies     Status: None   Collection Time: 01/31/20 10:55 AM  Result Value Ref Range   Beta-2 Glyco I IgG <9 < OR = 20 SGU   Beta-2 Glyco 1 IgM 9 < OR =  20 SMU   Beta-2 Glyco 1 IgA <9 < OR = 20 SAU  Cardiolipin antibodies, IgG, IgM, IgA     Status: None   Collection Time: 01/31/20 10:55 AM  Result Value Ref Range   Anticardiolipin IgA <11 APL    Comment:     Value       Interpretation     -----       --------------     < or = 11   Negative     12 - 20     Indeterminate     21 - 80     Low to Medium Positive     >80         High Positive      Anticardiolipin IgG <14 GPL    Comment:     Value       Interpretation     -----       --------------     < or = 14   Negative     15 - 20     Indeterminate     21 - 80     Low to Medium Positive     >80         High Positive    Anticardiolipin IgM <12 MPL    Comment:     Value       Interpretation     -----       --------------     < or = 12   Negative     13 - 20     Indeterminate     21 - 80     Low to Medium Positive     >80         High Positive . The antiphospholipid antibody syndrome (APS) is a  clinical-pathologic correlation that includes a  clinical event (e.g. thrombosis, pregnancy loss,  thrombocytopenia) and persistent positive  antiphospholipid antibodies  (IgM or IgG ACA >40 MPL/GPL, IgM or IgG anti-b2GPI antibodies or a lupus anticoagulant). International  consensus guidelines for APS suggest waiting at least  12 weeks before retesting to confirm antibody  persistence.  The  Systemic Lupus International  Collaborating Clinics immunological classification  criteria for systemic lupus erythematosus (SLE)  include testing for isotype IgA, which has yet to be  incorporated into APS criteria. Low level  antiphospholipid antibodies may sometimes be detected  in the setting of infection, drug therapy or aging.   Lupus Anticoagulant Eval w/Reflex     Status: None   Collection Time: 01/31/20 10:55 AM  Result Value Ref Range   Lupus Anticoagulant see note     Comment: A Lupus Anticoagulant is not detected. Marland Kitchen Reference Range:  Not Detected . For additional information, please refer to http://education.[facility].com/faq/FAQ01v2 . (This link is being provided for informational/ educational purposes only.) . Marland Kitchen This interpretation is based on the following test results. Marland Kitchen    PTT-LA Screen 34 <=40 sec   dRVVT 39 <=45 sec   PT, Mixing Interp Not Indicated   Anti-nuclear ab-titer (ANA titer)     Status: Abnormal   Collection Time: 01/31/20 10:55 AM  Result Value Ref Range   ANA Titer 1 1:320 (H) titer    Comment:                 Reference Range                 <1:40        Negative  1:40-1:80    Low Antibody Level                 >1:80        Elevated Antibody Level .    ANA Pattern 1 Nuclear, Homogeneous (A)     Comment: Homogeneous pattern is associated with systemic lupus erythematosus (SLE), drug-induced lupus and juvenile idiopathic arthritis. . AC-1: Homogeneous . International Consensus on ANA Patterns (https://[email or web address]-[email or web address]/)   Urinalysis, Routine w reflex microscopic     Status: Abnormal   Collection Time: 03/22/20  9:51 AM  Result Value Ref Range   Specific Gravity, UA 1.017 1.005 - 1.030   pH, UA 5.5 5.0 - 7.5   Color, UA Yellow Yellow   Appearance Ur Cloudy (A) Clear   Leukocytes,UA Negative Negative   Protein,UA Negative Negative/Trace   Glucose, UA Negative Negative   Ketones, UA Negative  Negative   RBC, UA Negative Negative   Bilirubin, UA Negative Negative   Urobilinogen, Ur 0.2 0.2 - 1.0 mg/dL   Nitrite, UA Negative Negative   Microscopic Examination Comment     Comment: Microscopic not indicated and not performed.  HCV Ab Reflex to Quant PCR     Status: None   Collection Time: 03/23/20  9:33 AM  Result Value Ref Range   HCV Ab <0.1 0.0 - 0.9 s/co ratio    Comment: (NOTE) Performed At: [facility] [address] [place], [place] [id number] Rush Farmer MD [id number]   T4, free     Status: None   Collection Time: 03/23/20  9:33 AM  Result Value Ref Range   Free T4 1.02 0.61 - 1.12 ng/dL    Comment: (NOTE) Biotin ingestion may interfere with free T4 tests. If the results are inconsistent with the TSH level, previous test results, or the clinical presentation, then consider biotin interference. If needed, order repeat testing after stopping biotin. Performed at [facility], [place] [address] [place], [place] [address]   TSH     Status: Abnormal   Collection Time: 03/23/20  9:33 AM  Result Value Ref Range   TSH 0.063 (L) 0.350 - 4.500 uIU/mL    Comment: Performed by a 3rd Generation assay with a functional sensitivity of <=0.01 uIU/mL. Performed at [facility], [address], [place], [place] [address]   VITAMIN D 25 Hydroxy (Vit-D Deficiency, Fractures)     Status: None   Collection Time: 03/23/20  9:33 AM  Result Value Ref Range   Vit D, 25-Hydroxy 45.94 30 - 100 ng/mL    Comment: (NOTE) Vitamin D deficiency has been defined by the [place] practice guideline as a level of serum 25-OH  vitamin D less than 20 ng/mL (1,2). The Endocrine Society went on to  further define vitamin D insufficiency as a level between 21 and 29  ng/mL (2). 1. IOM (Institute of Medicine). 2010. Dietary reference intakes for  calcium and D. [place]: The [employer]. 2. Holick MF, Binkley [place],  Bischoff-Ferrari HA, et al. Evaluation,  treatment, and prevention of vitamin D deficiency: an Endocrine  Society clinical practice guideline, JCEM. 2011 Jul; 96(7): 1911-30. Performed at [facility], [place] [address], [place] [address]   CBC     Status: None   Collection Time: 03/23/20  9:33 AM  Result Value Ref Range   WBC 4.7 4.0 - 10.5 K/uL   RBC 4.32 3.87 - 5.11 MIL/uL   Hemoglobin 12.4  12.0 - 15.0 g/dL   HCT 38.9 36.0 - 46.0 %   MCV 90.0 80.0 - 100.0 fL   MCH 28.7 26.0 - 34.0 pg   MCHC 31.9 30.0 - 36.0 g/dL   RDW 12.9 11.5 - 15.5 %   Platelets 326 150 - 400 K/uL   nRBC 0.0 0.0 - 0.2 %    Comment: Performed at [facility], [address] [place], [place] [address]  Comprehensive metabolic panel     Status: Abnormal   Collection Time: 03/23/20  9:33 AM  Result Value Ref Range   Sodium 141 135 - 145 mmol/L   Potassium 4.0 3.5 - 5.1 mmol/L   Chloride 105 98 - 111 mmol/L   CO2 25 22 - 32 mmol/L   Glucose, Bld 104 (H) 70 - 99 mg/dL    Comment: Glucose reference range applies only to samples taken after fasting for at least 8 hours.   BUN 16 8 - 23 mg/dL   Creatinine, Ser 0.88 0.44 - 1.00 mg/dL   Calcium 9.0 8.9 - 10.3 mg/dL   Total Protein 7.3 6.5 - 8.1 g/dL   Albumin 4.0 3.5 - 5.0 g/dL   AST 23 15 - 41 U/L   ALT 27 0 - 44 U/L   Alkaline Phosphatase 59 38 - 126 U/L   Total Bilirubin 0.5 0.3 - 1.2 mg/dL   GFR calc non Af Amer >60 >60 mL/min   GFR calc Af Amer >60 >60 mL/min   Anion gap 11 5 - 15    Comment: Performed at [facility], [address], [place], [place] [address]  Interpretation:     Status: None   Collection Time: 03/23/20  9:33 AM  Result Value Ref Range   HCV Interp 1: Comment     Comment: (NOTE) Negative Not infected with HCV, unless recent infection is suspected or other evidence exists to indicate HCV infection. Performed At: [facility] [place], [place] [id number] Rush Farmer MD LA:[id number]   Lipid  panel     Status: Abnormal   Collection Time: 03/23/20  9:34 AM  Result Value Ref Range   Cholesterol 188 0 - 200 mg/dL   Triglycerides 109 <150 mg/dL   HDL 48 >40 mg/dL   Total CHOL/HDL Ratio 3.9 RATIO   VLDL 22 0 - 40 mg/dL   LDL Cholesterol 118 (H) 0 - 99 mg/dL    Comment:        Total Cholesterol/HDL:CHD Risk Coronary Heart Disease Risk Table                     Men   Women  1/2 Average Risk   3.4   3.3  Average Risk       5.0   4.4  2 X Average Risk   9.6   7.1  3 X Average Risk  23.4   11.0        Use the calculated Patient Ratio above and the CHD Risk Table to determine the patient's CHD Risk.        ATP III CLASSIFICATION (LDL):  <100     mg/dL   Optimal  100-129  mg/dL   Near or Above                    Optimal  130-159  mg/dL   Borderline  160-189  mg/dL   High  >190     mg/dL   Very High Performed at  [address] [place], [place] [address]     Assessment/Plan: 1. Encounter for general adult medical examination with abnormal findings Annual health maintenance exam today.  Lab slip give to check routine, fasting labs. She does see GYN provider yearly for well-woman exams and mammograms.   2. Primary osteoarthritis of both hands Patient may take hydrocodone/APAP 7.5/325mg up to every 6 hours as needed for pain. Single prescription for #30 tablets sent to her pharmacy today.  - HYDROcodone-acetaminophen ([place]) 7.5-325 MG tablet; Take 1-2 tablets by mouth every 6 (six) hours as needed for moderate pain.  Dispense: 30 tablet; Refill: 0  3. Hypothyroidism, unspecified type Check thyroid panel and adjust dosing of levothyroxine as indicated.   4. Mild intermittent chronic asthma without complication Stable. Continue inhalers and respiratory medication as prescribed.   5. OSA (obstructive sleep apnea) Continue regular visits with pulmonology as scheduled.   6. Dysuria - Urinalysis, Routine w reflex microscopic  General Counseling: lawrie tunks understanding of the findings of todays visit and agrees with plan of treatment. I have discussed any further diagnostic evaluation that may be needed or ordered today. We also reviewed her medications today. she has been encouraged to call the office with any questions or concerns that should arise related to todays visit.    Counseling:  This patient was seen by Leretha Pol FNP Collaboration with Dr Lavera Guise as a part of collaborative care agreement  Orders Placed This Encounter  Procedures  . Urinalysis, Routine w reflex microscopic    Meds ordered this encounter  Medications  . HYDROcodone-acetaminophen (NORCO) 7.5-325 MG tablet    Sig: Take 1-2 tablets by mouth every 6 (six) hours as needed for moderate pain.    Dispense:  30 tablet    Refill:  0    Order Specific Question:   Supervising Provider    Answer:   Lavera Guise [[id number]]    Total time spent: 52 Minutes  Time spent includes review of chart, medications, test results, and follow up plan with the patient.     Lavera Guise, MD  Internal Medicine

## 2020-03-23 ENCOUNTER — Telehealth: Payer: Self-pay

## 2020-03-23 ENCOUNTER — Other Ambulatory Visit (HOSPITAL_COMMUNITY)
Admission: RE | Admit: 2020-03-23 | Discharge: 2020-03-23 | Disposition: A | Discharge status: Home / Self Care | Payer: Medicare HMO | Source: Ambulatory Visit | Attending: Nurse Practitioner | Admitting: Nurse Practitioner

## 2020-03-23 DIAGNOSIS — Z0001 Encounter for general adult medical examination with abnormal findings: Secondary | ICD-10-CM | POA: Diagnosis not present

## 2020-03-23 DIAGNOSIS — I10 Essential (primary) hypertension: Secondary | ICD-10-CM | POA: Diagnosis not present

## 2020-03-23 DIAGNOSIS — E559 Vitamin D deficiency, unspecified: Secondary | ICD-10-CM | POA: Diagnosis not present

## 2020-03-23 LAB — LIPID PANEL
Cholesterol: 188 mg/dL (ref 0–200)
HDL: 48 mg/dL (ref >40)
LDL Cholesterol: 118 mg/dL — ABNORMAL HIGH (ref 0–99)
Total CHOL/HDL Ratio: 3.9 RATIO
Triglycerides: 109 mg/dL (ref <150)
VLDL: 22 mg/dL (ref 0–40)

## 2020-03-23 LAB — COMPREHENSIVE METABOLIC PANEL
ALT: 27 U/L (ref 0–44)
AST: 23 U/L (ref 15–41)
Albumin: 4 g/dL (ref 3.5–5.0)
Alkaline Phosphatase: 59 U/L (ref 38–126)
Anion gap: 11 (ref 5–15)
BUN: 16 mg/dL (ref 8–23)
CO2: 25 mmol/L (ref 22–32)
Calcium: 9 mg/dL (ref 8.9–10.3)
Chloride: 105 mmol/L (ref 98–111)
Creatinine, Ser: 0.88 mg/dL (ref 0.44–1.00)
GFR calc Af Amer: >60 mL/min (ref >60)
GFR calc non Af Amer: >60 mL/min (ref >60)
Glucose, Bld: 104 mg/dL — ABNORMAL HIGH (ref 70–99)
Potassium: 4 mmol/L (ref 3.5–5.1)
Sodium: 141 mmol/L (ref 135–145)
Total Bilirubin: 0.5 mg/dL (ref 0.3–1.2)
Total Protein: 7.3 g/dL (ref 6.5–8.1)

## 2020-03-23 LAB — URINALYSIS, ROUTINE W REFLEX MICROSCOPIC
Bilirubin, UA: NEGATIVE
Glucose, UA: NEGATIVE
Ketones, UA: NEGATIVE
Leukocytes,UA: NEGATIVE
Nitrite, UA: NEGATIVE
Protein,UA: NEGATIVE
RBC, UA: NEGATIVE
Specific Gravity, UA: 1.017 (ref 1.005–1.030)
Urobilinogen, Ur: 0.2 mg/dL (ref 0.2–1.0)
pH, UA: 5.5 (ref 5.0–7.5)

## 2020-03-23 LAB — CBC
HCT: 38.9 % (ref 36.0–46.0)
Hemoglobin: 12.4 g/dL (ref 12.0–15.0)
MCH: 28.7 pg (ref 26.0–34.0)
MCHC: 31.9 g/dL (ref 30.0–36.0)
MCV: 90 fL (ref 80.0–100.0)
Platelets: 326 10*3/uL (ref 150–400)
RBC: 4.32 MIL/uL (ref 3.87–5.11)
RDW: 12.9 % (ref 11.5–15.5)
WBC: 4.7 10*3/uL (ref 4.0–10.5)
nRBC: 0 % (ref 0.0–0.2)

## 2020-03-23 LAB — VITAMIN D 25 HYDROXY (VIT D DEFICIENCY, FRACTURES): Vit D, 25-Hydroxy: 45.94 ng/mL (ref 30–100)

## 2020-03-23 LAB — T4, FREE: Free T4: 1.02 ng/dL (ref 0.61–1.12)

## 2020-03-23 LAB — TSH: TSH: 0.063 u[IU]/mL — ABNORMAL LOW (ref 0.350–4.500)

## 2020-03-24 LAB — HCV AB W REFLEX TO QUANT PCR: HCV Ab: <0.1 s/co ratio (ref 0.0–0.9)

## 2020-03-24 LAB — HCV INTERPRETATION

## 2020-03-26 DIAGNOSIS — G4733 Obstructive sleep apnea (adult) (pediatric): Secondary | ICD-10-CM | POA: Insufficient documentation

## 2020-03-27 ENCOUNTER — Other Ambulatory Visit: Payer: Self-pay | Admitting: Nurse Practitioner

## 2020-03-27 DIAGNOSIS — M064 Inflammatory polyarthropathy: Secondary | ICD-10-CM

## 2020-03-27 NOTE — Progress Notes (Signed)
Labs stable

## 2020-03-27 NOTE — Telephone Encounter (Signed)
Order placed for patient to be referred to pain management. Diagnosis is primary, generalized, and inflammatory osteoarthritis.

## 2020-03-27 NOTE — Progress Notes (Signed)
Order placed for patient to be referred to pain management. Diagnosis is primary, generalized, and inflammatory osteoarthritis.

## 2020-03-30 ENCOUNTER — Other Ambulatory Visit: Payer: Self-pay | Admitting: Obstetrics & Gynecology

## 2020-03-30 ENCOUNTER — Telehealth: Payer: Self-pay

## 2020-03-30 DIAGNOSIS — Z780 Asymptomatic menopausal state: Secondary | ICD-10-CM

## 2020-03-30 NOTE — Telephone Encounter (Signed)
Right now, I don't think we need to adjust her medication as her Free t4 is well within normal limits. However, we should get her set up for a thyroid ultrasound to make sure the gland itself looks ok. Can you let her know that and then give to beth to set up appointment and follow up? Thanks.

## 2020-03-30 NOTE — Telephone Encounter (Signed)
Patient was notified. Just needs to be informed for when her thyroid ultrasound will be

## 2020-04-02 ENCOUNTER — Other Ambulatory Visit: Payer: Self-pay

## 2020-04-02 DIAGNOSIS — E785 Hyperlipidemia, unspecified: Secondary | ICD-10-CM

## 2020-04-02 MED ORDER — LEVOTHYROXINE SODIUM 137 MCG PO TABS: 137.0000 ug | ORAL_TABLET | Freq: Every day | ORAL | 3 refills | Status: DC

## 2020-04-02 MED ORDER — FENOFIBRATE 145 MG PO TABS: 145.0000 mg | ORAL_TABLET | Freq: Every day | ORAL | 1 refills | Status: DC

## 2020-04-02 MED ORDER — EZETIMIBE 10 MG PO TABS: 10.0000 mg | ORAL_TABLET | Freq: Every day | ORAL | 1 refills | Status: DC

## 2020-04-02 NOTE — Telephone Encounter (Signed)
She mentioned this when she first came in to [place], but she and I talked so much about arthritis that we never even mentioned the varicose veins. The first thing we should do is get her set up for venous ultrasound, lookeg primarily at varicose veins and venous reflux. We can follow up after that. Thanks.

## 2020-04-03 ENCOUNTER — Ambulatory Visit (INDEPENDENT_AMBULATORY_CARE_PROVIDER_SITE_OTHER): Payer: Medicare HMO | Admitting: Internal Medicine

## 2020-04-03 DIAGNOSIS — G4733 Obstructive sleep apnea (adult) (pediatric): Secondary | ICD-10-CM | POA: Diagnosis not present

## 2020-04-04 DIAGNOSIS — G894 Chronic pain syndrome: Secondary | ICD-10-CM | POA: Diagnosis not present

## 2020-04-04 DIAGNOSIS — G250 Essential tremor: Secondary | ICD-10-CM | POA: Diagnosis not present

## 2020-04-04 DIAGNOSIS — M797 Fibromyalgia: Secondary | ICD-10-CM | POA: Diagnosis not present

## 2020-04-04 DIAGNOSIS — G4700 Insomnia, unspecified: Secondary | ICD-10-CM | POA: Diagnosis not present

## 2020-04-05 ENCOUNTER — Telehealth: Payer: Self-pay

## 2020-04-05 NOTE — Telephone Encounter (Signed)
Cancel message

## 2020-04-05 NOTE — Telephone Encounter (Deleted)
She is wanting the status of her oxycodone prior auth.

## 2020-04-11 ENCOUNTER — Other Ambulatory Visit: Payer: Self-pay

## 2020-04-11 MED ORDER — DOXYCYCLINE HYCLATE 50 MG PO CAPS
50.0000 mg | ORAL_CAPSULE | Freq: Every day | ORAL | 0 refills | Status: DC
Start: 2020-04-11 — End: 2020-10-02

## 2020-04-17 ENCOUNTER — Telehealth: Payer: Self-pay

## 2020-04-17 NOTE — Telephone Encounter (Signed)
Patient rescheduled appointment on 04/19/2020 to 05/03/2020. klh

## 2020-04-19 ENCOUNTER — Ambulatory Visit: Payer: Medicare HMO | Admitting: Internal Medicine

## 2020-04-20 ENCOUNTER — Other Ambulatory Visit: Payer: Self-pay | Admitting: Nurse Practitioner

## 2020-04-20 DIAGNOSIS — E038 Other specified hypothyroidism: Secondary | ICD-10-CM

## 2020-04-20 DIAGNOSIS — I8393 Asymptomatic varicose veins of bilateral lower extremities: Secondary | ICD-10-CM

## 2020-04-23 NOTE — Progress Notes (Signed)
Virtual Visit via Video Note  I connected with Susan [name] on 04/26/20 at 10:20 AM EDT by a video enabled telemedicine application and verified that I am speaking with the correct person using two identifiers.   I discussed the limitations of evaluation and management by telemedicine and the availability of in person appointments. The patient expressed understanding and agreed to proceed.     I discussed the assessment and treatment plan with the patient. The patient was provided an opportunity to ask questions and all were answered. The patient agreed with the plan and demonstrated an understanding of the instructions.   The patient was advised to call back or seek an in-person evaluation if the symptoms worsen or if the condition fails to improve as anticipated.  Location: patient- home, provider- office   I provided 12 minutes of non-face-to-face time during this encounter.   [name], MD    [facility] MD/PA/NP OP Progress Note  04/26/2020 10:33 AM REILYNN [name]  MRN:  [id number]  Chief Complaint:  Chief Complaint    Follow-up; Anxiety; Depression     HPI:  This is a follow-up appointment for depression and anxiety.  She states that she has been doing good.  She enjoyed going to the beach and stayed there for 2 weeks.  She was able to meet one of her close friend. She reports good relationship with her husband. She has been trying to reach out to Dr. Pecola Leisure office. She is concerned that there is a documentation of her risk of hurting other people, although she adamantly denies any HI or aggression in the past. She has no idea why it was written that way. She sleeps better. She has good appetite.  She believes she gained some weight (although it has unchanged for the last six months according to the record).  She has good energy and motivation.  She denies SI.  She denies anxiety or panic attacks.  She has short-term memory issues. She occasionally has difficulty in concentration  when she watches TV. She takes hydrocodone only as needed when she has exacerbation of pain.   187 lbs Wt Readings from Last 3 Encounters:  03/22/20 187 lb 9.6 oz (85.1 kg)  03/15/20 188 lb 12.8 oz (85.6 kg)  02/29/20 187 lb (84.8 kg)    Visit Diagnosis: No diagnosis found.  Past Psychiatric History: Please see initial evaluation for full details. I have reviewed the history. No updates at this time.     Past Medical History:  Past Medical History:  Diagnosis Date  . Arthritis   . Asthma   . Asthmatic bronchitis   . BRCA negative 05/2014   MyRisk neg  . Celiac disease   . Celiac sprue   . Chronic anxiety   . Complication of anesthesia    vomiting  . COPD (chronic obstructive pulmonary disease) ([place])   . Depression   . Environmental allergies   . Family history of breast cancer 05/2014   MyRisk neg; IBIS=29.5%  . Fibromyalgia    neuropathy all over  . Gastritis 10/08/2013  . GERD (gastroesophageal reflux disease)   . Hyperlipemia   . Hyperlipidemia    Intolerant to statins  . Hypothyroidism (acquired)   . Increased risk of breast cancer 05/2014   IBIS=29.5%  . Increased risk of breast cancer 05/2014   IBIS=29.5%  . Migraine   . Migraines   . 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 Pancreatitis   . Sjogren's disease ([place])   . Spastic colon   .  Thyroid cancer ([place]) 1990 and 1994   Total thyroidectomy with radioactive iodine.     Past Surgical History:  Procedure Laterality Date  . ABDOMINAL HYSTERECTOMY  2006  . BLADDER REPAIR    . BREAST CYST ASPIRATION Left yrs ago  . CHOLECYSTECTOMY  1989  . COLONOSCOPY WITH ESOPHAGOGASTRODUODENOSCOPY (EGD)    . COLONOSCOPY WITH PROPOFOL N/A 08/03/2017   Procedure: COLONOSCOPY WITH PROPOFOL;  Surgeon: Lollie Sails, MD;  Location: [facility] ENDOSCOPY;  Service: Endoscopy;  Laterality: N/A;  . COLONOSCOPY WITH PROPOFOL N/A 10/18/2018   Procedure: COLONOSCOPY WITH PROPOFOL;  Surgeon: Lollie Sails, MD;  Location: [facility]  ENDOSCOPY;  Service: Endoscopy;  Laterality: N/A;  . ESOPHAGOGASTRODUODENOSCOPY N/A 10/07/2013   Procedure: ESOPHAGOGASTRODUODENOSCOPY (EGD);  Surgeon: Danie Binder, MD;  Location: [facility] ENDO SUITE;  Service: Endoscopy;  Laterality: N/A;  patient received heparin at 530am given phenergan 74m IV 30 minutes before  . EYE SURGERY     lacrimal gland   . FLEXIBLE BRONCHOSCOPY N/A 06/04/2016   Procedure: FLEXIBLE BRONCHOSCOPY;  Surgeon: SAllyne Gee MD;  Location: [facility] ORS;  Service: Pulmonary;  Laterality: N/A;  . INCONTINENCE SURGERY  2004  . NASAL SINUS SURGERY    . THYROID LOBECTOMY    . TOTAL THYROIDECTOMY      Family Psychiatric History: Please see initial evaluation for full details. I have reviewed the history. No updates at this time.     Family History:  Family History  Problem Relation Age of Onset  . Arthritis/Rheumatoid Mother        died in age 5753s . Breast cancer Mother 457 . Breast cancer Maternal Grandmother        40's  . Breast cancer Maternal Aunt        X 2. 50's  . Lung cancer Maternal Aunt   . Pancreatitis Neg Hx   . Colon cancer Neg Hx     Social History:  Social History   Socioeconomic History  . Marital status: Married    Spouse name: Not on file  . Number of children: 0  . Years of education: Not on file  . Highest education level: Not on file  Occupational History  . Not on file  Tobacco Use  . Smoking status: Never Smoker  . Smokeless tobacco: Never Used  Vaping Use  . Vaping Use: Never used  Substance and Sexual Activity  . Alcohol use: No  . Drug use: No  . Sexual activity: Yes    Birth control/protection: Surgical  Other Topics Concern  . Not on file  Social History Narrative  . Not on file   Social Determinants of Health   Financial Resource Strain:   . Difficulty of Paying Living Expenses:   Food Insecurity:   . Worried About RCharity fundraiserin the Last Year:   . RArboriculturistin the Last Year:   Transportation  Needs:   . LFilm/video editor(Medical):   .Marland KitchenLack of Transportation (Non-Medical):   Physical Activity:   . Days of Exercise per Week:   . Minutes of Exercise per Session:   Stress:   . Feeling of Stress :   Social Connections:   . Frequency of Communication with Friends and Family:   . Frequency of Social Gatherings with Friends and Family:   . Attends Religious Services:   . Active Member of Clubs or Organizations:   . Attends CArchivistMeetings:   .Marland KitchenMarital Status:  Allergies:  Allergies  Allergen Reactions  . Aspirin   . Keflex [Cephalexin] Shortness Of Breath  . Azithromycin Hives and Nausea Only  . Barley Grass   . Clarithromycin Other (See Comments)  . Metoclopramide Other (See Comments)    TREMORS Tremors Tremors   . Oat Other (See Comments)  . Oatmeal Other (See Comments)  . Other     Other reaction(s): Other (See Comments) KEOPLEX.  Marland Kitchen Rye Grass Flower Pollen Extract [Gramineae Pollens]   . Vioxx [Rofecoxib] Diarrhea  . Wheat Bran Other (See Comments)    Celiac disease  . Wheat Extract   . Celebrex [Celecoxib] Hives  . Metoclopramide Hcl Anxiety    BODY TREMORS    Metabolic Disorder Labs: No results found for: HGBA1C, MPG No results found for: PROLACTIN Lab Results  Component Value Date   CHOL 188 03/23/2020   TRIG 109 03/23/2020   HDL 48 03/23/2020   CHOLHDL 3.9 03/23/2020   VLDL 22 03/23/2020   LDLCALC 118 (H) 03/23/2020   LDLCALC 112 (H) 03/03/2019   Lab Results  Component Value Date   TSH 0.063 (L) 03/23/2020   TSH 0.621 03/03/2019    Therapeutic Level Labs: No results found for: LITHIUM No results found for: VALPROATE No components found for:  CBMZ  Current Medications: Current Outpatient Medications  Medication Sig Dispense Refill  . acetaminophen (TYLENOL) 500 MG tablet Take by mouth.    Marland Kitchen albuterol (PROVENTIL HFA;VENTOLIN HFA) 108 (90 Base) MCG/ACT inhaler Inhale 2 puffs into the lungs every 6 (six) hours as  needed for wheezing or shortness of breath. 1 Inhaler 2  . albuterol (PROVENTIL) (2.5 MG/3ML) 0.083% nebulizer solution Take 3 mLs (2.5 mg total) by nebulization every 6 (six) hours as needed for wheezing or shortness of breath. 75 mL 6  . ALPRAZolam (XANAX) 0.5 MG tablet Take 1 tablet (0.5 mg total) by mouth 3 (three) times daily as needed for anxiety. 90 tablet 0  . ALPRAZolam (XANAX) 0.5 MG tablet Take 1 tablet (0.5 mg total) by mouth 3 (three) times daily as needed for sleep or anxiety. 90 tablet 1  . amitriptyline (ELAVIL) 25 MG tablet Take 1 tablet (25 mg total) by mouth at bedtime. 90 tablet 0  . Ascorbic Acid (VITAMIN C) 100 MG tablet Take 100 mg by mouth daily.    . calcium carbonate (OS-CAL) 1250 (500 Ca) MG chewable tablet Chew by mouth.    . carisoprodol (SOMA) 350 MG tablet Take 350 mg by mouth at bedtime. Patient states that she takes 1/2 tablet nightly    . CLIMARA 0.1 MG/24HR patch APPLY 1 PATCH TO SKIN ONCE WEEKLY 12 patch 4  . Dentifrices (BIOTENE DRY MOUTH DT) by Transmucosal route.    . doxycycline (VIBRAMYCIN) 50 MG capsule Take one cap po QD-BID. Take with food. 60 capsule 0  . doxycycline (VIBRAMYCIN) 50 MG capsule Take 1 capsule (50 mg total) by mouth daily. 60 capsule 0  . eletriptan (RELPAX) 40 MG tablet Take 40 mg by mouth as needed for migraine. may repeat in 2 hours if necessary     . EPINEPHRINE 0.3 mg/0.3 mL IJ SOAJ injection USE AS DIRECTED 1 Device 2  . ezetimibe (ZETIA) 10 MG tablet Take 1 tablet (10 mg total) by mouth daily. 90 tablet 1  . famotidine (PEPCID) 10 MG tablet Take 20 mg by mouth.    . fenofibrate (TRICOR) 145 MG tablet Take 1 tablet (145 mg total) by mouth daily. 90 tablet 1  .  fluticasone (FLONASE) 50 MCG/ACT nasal spray Place 2 sprays into both nostrils daily. 16 g 3  . fluticasone furoate-vilanterol (BREO ELLIPTA) 100-25 MCG/INH AEPB Inhale 1 puff into the lungs daily. 60 each 2  . gabapentin (NEURONTIN) 600 MG tablet Take 1/2 (one-half) tablet by  mouth twice daily 45 tablet 3  . HYDROcodone-acetaminophen (NORCO) 7.5-325 MG tablet Take 1-2 tablets by mouth every 6 (six) hours as needed for moderate pain. 30 tablet 0  . hydroxychloroquine (PLAQUENIL) 200 MG tablet Take by mouth 2 (two) times daily.    . hyoscyamine (NULEV) 0.125 MG TBDP Place 0.125 mg under the tongue every 4 (four) hours as needed for bladder spasms.     . Lactobacillus (DIGESTIVE HEALTH PROBIOTIC PO) Take 1 tablet by mouth daily.    Marland Kitchen levothyroxine (SYNTHROID) 137 MCG tablet Take 1 tablet (137 mcg total) by mouth daily before breakfast. 30 tablet 3  . LINZESS 290 MCG CAPS capsule Take 290 mcg by mouth daily.    . Magnesium Oxide (NATRUL MAGNESIUM PO) Take by mouth.    . montelukast (SINGULAIR) 10 MG tablet TAKE ONE TABLET BY MOUTH ONCE DAILY FOR ASTHMA 90 tablet 3  . Multiple Vitamins-Minerals (HAIR SKIN AND NAILS FORMULA PO) Take by mouth.    . mupirocin ointment (BACTROBAN) 2 % as needed.   0  . pantoprazole (PROTONIX) 40 MG tablet Take 40 mg by mouth daily.    Vladimir Faster Glycol-Propyl Glycol 0.4-0.3 % SOLN Apply to eye.    Marland Kitchen PRESCRIPTION MEDICATION 1 Syringe by Subdermal route once a week. Allergy shot    . Probiotic Product ([place]) CAPS Take by mouth.    . sodium chloride (OCEAN) 0.65 % SOLN nasal spray Place 1 spray into both nostrils as needed for congestion.    . sodium chloride 0.9 % nebulizer solution     . SUMAtriptan-naproxen (TREXIMET) 85-500 MG tablet daily as needed.     . Tuberculin-Allergy Syringes 28G X 1/2" 1 ML MISC 1 each by Does not apply route every 14 (fourteen) days. For allergy injections 12 each 3  . Vaginal Lubricant (REPLENS) GEL Place vaginally.    . venlafaxine XR (EFFEXOR-XR) 150 MG 24 hr capsule Take 2 capsules (300 mg total) by mouth at bedtime. 180 capsule 0  . Vitamin D, Ergocalciferol, (DRISDOL) 1.25 MG (50000 UNIT) CAPS capsule TAKE 1 CAPSULE (50,000 UNITS TOTAL) BY MOUTH EVERY 7 (SEVEN) DAYS. MONDAY 12 capsule 1  .  vitamin E 1000 UNIT capsule Take by mouth.    Marland Kitchen White Petrolatum-Mineral Oil ([place] PETROL-MINERAL OIL-LANOLIN) 0.1-0.1 % OINT      No current facility-administered medications for this visit.     Musculoskeletal: Strength & Muscle Tone: N/A Gait & Station: N/A Patient leans: N/A  Psychiatric Specialty Exam: Review of Systems  Psychiatric/Behavioral: Negative for agitation, behavioral problems, confusion, decreased concentration, dysphoric mood, hallucinations, self-injury, sleep disturbance and suicidal ideas. The patient is nervous/anxious. The patient is not hyperactive.   All other systems reviewed and are negative.   There were no vitals taken for this visit.There is no height or weight on file to calculate BMI.  General Appearance: Fairly Groomed  Eye Contact:  Good  Speech:  Clear and Coherent  Volume:  Normal  Mood:  good  Affect:  Appropriate, Congruent and Full Range  Thought Process:  Coherent  Orientation:  Full (Time, Place, and Person)  Thought Content: Logical   Suicidal Thoughts:  No  Homicidal Thoughts:  No  Memory:  Immediate;  Good  Judgement:  Good  Insight:  Good  Psychomotor Activity:  Normal  Concentration:  Concentration: Good and Attention Span: Good  Recall:  Good  Fund of Knowledge: Good  Language: Good  Akathisia:  No  Handed:  Right  AIMS (if indicated): not done  Assets:  Communication Skills Desire for Improvement  ADL's:  Intact  Cognition: WNL  Sleep:  Good   Screenings: Mini-Mental     Clinical Support from 03/22/2020 in [facility], [place] from 03/22/2019 in [facility], [facility]  Total Score (max 30 points ) 30 30    PHQ2-9     Clinical Support from 03/22/2020 in [facility], [facility] Office Visit from 10/14/2019 in [facility], [facility] Office Visit from 07/14/2019 in [facility], [place] from 03/22/2019 in [facility], [facility] Office Visit from  02/07/2019 in [facility], [facility]  PHQ-2 Total Score 0 0 0 0 0       Assessment and Plan:  Susan [name] is a 64 y.o. year old female with a history of anxiety, bilateral osteoarthritis, COPD, s/p thyroidectomy, hypothyroidism, history ofCeliac Disease, GERD, Sjogren's syndrome, who presents for follow up appointment for below.   1. MDD (major depressive disorder), recurrent, in full remission ([place]) 2. Generalized anxiety disorder She denies significant mood symptoms since the last visit.  Psychosocial stressors includes loss of her parents a few years ago, and her medical condition which includes pain.  We will continue venlafaxine to target depression and anxiety.  We will continue amitriptyline for depression, anxiety and pain.  Will continue Xanax as needed for anxiety.  Discussed risk of dependence and fall, and respiratory suppression especially with concomitant use of opioid.  There has been no concerning behavior of misuse of this medication.   # Memory loss She complains of short-term memory loss since she was admitted to psychiatry unit for depression in 1999. IADL independent. Will consider MOCA as indicated in the future.   Plan I have reviewed and updated plans as below 1. Continue venlafaxine 300 mg daily  2. Continueamitriptyline25 mg at night (anticholinergic side effect from higher dose) 3. Continue Xanax 0.5 mg three times a day as needed for anxiety 4. Return to clinic - 9/30 at 10 AM for 30 mins, video - oncarisoprodol(Soma), gabapentin 300 mg BID  Past trials of medication:sertraline, Paxil, venlafaxine, Xanax, Ritalin (worked very well)  The patient demonstrates the following risk factors for suicide: Chronic risk factors for suicide include:psychiatric disorder ofanxiety. Acute risk factorsfor suicide include: N/A. Protective factorsfor this patient include: positive social support, responsibility to others (children, family), coping  skills and hope for the future. Considering these factors, the overall suicide risk at this point appears to below.She owns a gun.Patientisappropriate for outpatient follow up.  [name], MD 04/26/2020, 10:33 AM

## 2020-04-26 ENCOUNTER — Other Ambulatory Visit: Payer: Self-pay

## 2020-04-26 ENCOUNTER — Encounter (HOSPITAL_COMMUNITY): Payer: Self-pay | Admitting: Psychiatry

## 2020-04-26 ENCOUNTER — Telehealth (INDEPENDENT_AMBULATORY_CARE_PROVIDER_SITE_OTHER): Payer: Medicare HMO | Admitting: Psychiatry

## 2020-04-26 DIAGNOSIS — F411 Generalized anxiety disorder: Secondary | ICD-10-CM | POA: Diagnosis not present

## 2020-04-26 DIAGNOSIS — F3342 Major depressive disorder, recurrent, in full remission: Secondary | ICD-10-CM | POA: Diagnosis not present

## 2020-04-26 DIAGNOSIS — R69 Illness, unspecified: Secondary | ICD-10-CM | POA: Diagnosis not present

## 2020-04-26 MED ORDER — AMITRIPTYLINE HCL 25 MG PO TABS: 25.0000 mg | ORAL_TABLET | Freq: Every day | ORAL | 0 refills | Status: DC

## 2020-04-26 MED ORDER — VENLAFAXINE HCL ER 150 MG PO CP24: 300.0000 mg | ORAL_CAPSULE | Freq: Every day | ORAL | 0 refills | Status: DC

## 2020-04-26 MED ORDER — ALPRAZOLAM 0.5 MG PO TABS: 0.5000 mg | ORAL_TABLET | Freq: Three times a day (TID) | ORAL | 0 refills | Status: DC | PRN

## 2020-04-26 NOTE — Patient Instructions (Signed)
1. Continue venlafaxine 300 mg daily  2. Continueamitriptyline25 mg at night  3. Continue Xanax 0.5 mg three times a day as needed for anxiety 4. Return to clinic - 9/30 at 10 AM

## 2020-04-27 DIAGNOSIS — R69 Illness, unspecified: Secondary | ICD-10-CM | POA: Diagnosis not present

## 2020-05-01 ENCOUNTER — Telehealth: Payer: Self-pay

## 2020-05-01 NOTE — Telephone Encounter (Signed)
Confirmed patient appointment

## 2020-05-02 ENCOUNTER — Telehealth: Payer: Self-pay

## 2020-05-02 NOTE — Telephone Encounter (Signed)
Confirmed patient ultrasound appt

## 2020-05-03 ENCOUNTER — Other Ambulatory Visit: Payer: Self-pay

## 2020-05-03 ENCOUNTER — Ambulatory Visit: Payer: Medicare HMO | Admitting: Internal Medicine

## 2020-05-03 ENCOUNTER — Encounter: Payer: Self-pay | Admitting: Internal Medicine

## 2020-05-03 VITALS — BP 130/72 | HR 78 | Temp 97.5°F | Resp 16 | Ht 65.0 in | Wt 191.0 lb

## 2020-05-03 DIAGNOSIS — K219 Gastro-esophageal reflux disease without esophagitis: Secondary | ICD-10-CM | POA: Diagnosis not present

## 2020-05-03 DIAGNOSIS — J4520 Mild intermittent asthma, uncomplicated: Secondary | ICD-10-CM | POA: Diagnosis not present

## 2020-05-03 DIAGNOSIS — G4733 Obstructive sleep apnea (adult) (pediatric): Secondary | ICD-10-CM | POA: Diagnosis not present

## 2020-05-03 NOTE — Progress Notes (Signed)
[facility] [place], [place] [address]  Pulmonary Sleep Medicine   Office Visit Note  Patient Name: Susan [name] DOB: 1956-02-24 MRN [id number]  Date of Service: 05/03/2020  Complaints/HPI: patient is here for follow up on cpap titration study. Her titration study shows an optimal pressure of 9cm h2o. Will order her a  Machine with appropriate setting at this time.   ROS  General: (-) fever, (-) chills, (-) night sweats, (-) weakness Skin: (-) rashes, (-) itching,. Eyes: (-) visual changes, (-) redness, (-) itching. Nose and Sinuses: (-) nasal stuffiness or itchiness, (-) postnasal drip, (-) nosebleeds, (-) sinus trouble. Mouth and Throat: (-) sore throat, (-) hoarseness. Neck: (-) swollen glands, (-) enlarged thyroid, (-) neck pain. Respiratory: - cough, (-) bloody sputum, - shortness of breath, - wheezing. Cardiovascular: - ankle swelling, (-) chest pain. Lymphatic: (-) lymph node enlargement. Neurologic: (-) numbness, (-) tingling. Psychiatric: (-) anxiety, (-) depression   Current Medication: Outpatient Encounter Medications as of 05/03/2020  Medication Sig  . acetaminophen (TYLENOL) 500 MG tablet Take by mouth.  Marland Kitchen albuterol (PROVENTIL HFA;VENTOLIN HFA) 108 (90 Base) MCG/ACT inhaler Inhale 2 puffs into the lungs every 6 (six) hours as needed for wheezing or shortness of breath.  Marland Kitchen albuterol (PROVENTIL) (2.5 MG/3ML) 0.083% nebulizer solution Take 3 mLs (2.5 mg total) by nebulization every 6 (six) hours as needed for wheezing or shortness of breath.  . ALPRAZolam (XANAX) 0.5 MG tablet Take 1 tablet (0.5 mg total) by mouth 3 (three) times daily as needed for sleep or anxiety.  . ALPRAZolam (XANAX) 0.5 MG tablet Take 1 tablet (0.5 mg total) by mouth 3 (three) times daily as needed for anxiety.  Marland Kitchen amitriptyline (ELAVIL) 25 MG tablet Take 1 tablet (25 mg total) by mouth at bedtime.  . Ascorbic Acid (VITAMIN C) 100 MG tablet Take 100 mg by mouth daily.   . calcium carbonate (OS-CAL) 1250 (500 Ca) MG chewable tablet Chew by mouth.  . carisoprodol (SOMA) 350 MG tablet Take 350 mg by mouth at bedtime. Patient states that she takes 1/2 tablet nightly  . CLIMARA 0.1 MG/24HR patch APPLY 1 PATCH TO SKIN ONCE WEEKLY  . Dentifrices (BIOTENE DRY MOUTH DT) by Transmucosal route.  . doxycycline (VIBRAMYCIN) 50 MG capsule Take one cap po QD-BID. Take with food.  . doxycycline (VIBRAMYCIN) 50 MG capsule Take 1 capsule (50 mg total) by mouth daily.  Marland Kitchen eletriptan (RELPAX) 40 MG tablet Take 40 mg by mouth as needed for migraine. may repeat in 2 hours if necessary   . EPINEPHRINE 0.3 mg/0.3 mL IJ SOAJ injection USE AS DIRECTED  . ezetimibe (ZETIA) 10 MG tablet Take 1 tablet (10 mg total) by mouth daily.  . famotidine (PEPCID) 10 MG tablet Take 20 mg by mouth.  . fenofibrate (TRICOR) 145 MG tablet Take 1 tablet (145 mg total) by mouth daily.  . fluticasone (FLONASE) 50 MCG/ACT nasal spray Place 2 sprays into both nostrils daily.  . fluticasone furoate-vilanterol (BREO ELLIPTA) 100-25 MCG/INH AEPB Inhale 1 puff into the lungs daily.  Marland Kitchen gabapentin (NEURONTIN) 600 MG tablet Take 1/2 (one-half) tablet by mouth twice daily  . HYDROcodone-acetaminophen (NORCO) 7.5-325 MG tablet Take 1-2 tablets by mouth every 6 (six) hours as needed for moderate pain.  . hydroxychloroquine (PLAQUENIL) 200 MG tablet Take by mouth 2 (two) times daily.  . hyoscyamine (NULEV) 0.125 MG TBDP Place 0.125 mg under the tongue every 4 (four) hours as needed for bladder spasms.   Marland Kitchen  Lactobacillus (DIGESTIVE HEALTH PROBIOTIC PO) Take 1 tablet by mouth daily.  Marland Kitchen levothyroxine (SYNTHROID) 137 MCG tablet Take 1 tablet (137 mcg total) by mouth daily before breakfast.  . LINZESS 290 MCG CAPS capsule Take 290 mcg by mouth daily.  . Magnesium Oxide (NATRUL MAGNESIUM PO) Take by mouth.  . montelukast (SINGULAIR) 10 MG tablet TAKE ONE TABLET BY MOUTH ONCE DAILY FOR ASTHMA  . Multiple Vitamins-Minerals  (HAIR SKIN AND NAILS FORMULA PO) Take by mouth.  . mupirocin ointment (BACTROBAN) 2 % as needed.   . pantoprazole (PROTONIX) 40 MG tablet Take 40 mg by mouth daily.  Vladimir Faster Glycol-Propyl Glycol 0.4-0.3 % SOLN Apply to eye.  Marland Kitchen PRESCRIPTION MEDICATION 1 Syringe by Subdermal route once a week. Allergy shot  . Probiotic Product ([place]) CAPS Take by mouth.  . sodium chloride (OCEAN) 0.65 % SOLN nasal spray Place 1 spray into both nostrils as needed for congestion.  . sodium chloride 0.9 % nebulizer solution   . SUMAtriptan-naproxen (TREXIMET) 85-500 MG tablet daily as needed.   . Tuberculin-Allergy Syringes 28G X 1/2" 1 ML MISC 1 each by Does not apply route every 14 (fourteen) days. For allergy injections  . Vaginal Lubricant (REPLENS) GEL Place vaginally.  . venlafaxine XR (EFFEXOR-XR) 150 MG 24 hr capsule Take 2 capsules (300 mg total) by mouth at bedtime.  . Vitamin D, Ergocalciferol, (DRISDOL) 1.25 MG (50000 UNIT) CAPS capsule TAKE 1 CAPSULE (50,000 UNITS TOTAL) BY MOUTH EVERY 7 (SEVEN) DAYS. MONDAY  . vitamin E 1000 UNIT capsule Take by mouth.  Dema Severin Petrolatum-Mineral Oil ([place] PETROL-MINERAL OIL-LANOLIN) 0.1-0.1 % OINT    No facility-administered encounter medications on file as of 05/03/2020.    Surgical History: Past Surgical History:  Procedure Laterality Date  . ABDOMINAL HYSTERECTOMY  2006  . BLADDER REPAIR    . BREAST CYST ASPIRATION Left yrs ago  . CHOLECYSTECTOMY  1989  . COLONOSCOPY WITH ESOPHAGOGASTRODUODENOSCOPY (EGD)    . COLONOSCOPY WITH PROPOFOL N/A 08/03/2017   Procedure: COLONOSCOPY WITH PROPOFOL;  Surgeon: Lollie Sails, MD;  Location: [facility] ENDOSCOPY;  Service: Endoscopy;  Laterality: N/A;  . COLONOSCOPY WITH PROPOFOL N/A 10/18/2018   Procedure: COLONOSCOPY WITH PROPOFOL;  Surgeon: Lollie Sails, MD;  Location: [facility] ENDOSCOPY;  Service: Endoscopy;  Laterality: N/A;  . ESOPHAGOGASTRODUODENOSCOPY N/A 10/07/2013   Procedure:  ESOPHAGOGASTRODUODENOSCOPY (EGD);  Surgeon: Danie Binder, MD;  Location: [facility] ENDO SUITE;  Service: Endoscopy;  Laterality: N/A;  patient received heparin at 530am given phenergan 63m IV 30 minutes before  . EYE SURGERY     lacrimal gland   . FLEXIBLE BRONCHOSCOPY N/A 06/04/2016   Procedure: FLEXIBLE BRONCHOSCOPY;  Surgeon: SAllyne Gee MD;  Location: [facility] ORS;  Service: Pulmonary;  Laterality: N/A;  . INCONTINENCE SURGERY  2004  . NASAL SINUS SURGERY    . THYROID LOBECTOMY    . TOTAL THYROIDECTOMY      Medical History: Past Medical History:  Diagnosis Date  . Arthritis   . Asthma   . Asthmatic bronchitis   . BRCA negative 05/2014   MyRisk neg  . Celiac disease   . Celiac sprue   . Chronic anxiety   . Complication of anesthesia    vomiting  . COPD (chronic obstructive pulmonary disease) ([place]   . Depression   . Environmental allergies   . Family history of breast cancer 05/2014   MyRisk neg; IBIS=29.5%  . Fibromyalgia    neuropathy all over  . Gastritis 10/08/2013  .  GERD (gastroesophageal reflux disease)   . Hyperlipemia   . Hyperlipidemia    Intolerant to statins  . Hypothyroidism (acquired)   . Increased risk of breast cancer 05/2014   IBIS=29.5%  . Increased risk of breast cancer 05/2014   IBIS=29.5%  . Migraine   . Migraines   . 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 Pancreatitis   . Sjogren's disease ([place])   . Spastic colon   . Thyroid cancer ([place]) 1990 and 1994   Total thyroidectomy with radioactive iodine.     Family History: Family History  Problem Relation Age of Onset  . Arthritis/Rheumatoid Mother        died in age 43s  . Breast cancer Mother 105  . Breast cancer Maternal Grandmother        40's  . Breast cancer Maternal Aunt        X 2. 50's  . Lung cancer Maternal Aunt   . Pancreatitis Neg Hx   . Colon cancer Neg Hx     Social History: Social History   Socioeconomic History  . Marital status: Married    Spouse name: Not on file  .  Number of children: 0  . Years of education: Not on file  . Highest education level: Not on file  Occupational History  . Not on file  Tobacco Use  . Smoking status: Never Smoker  . Smokeless tobacco: Never Used  Vaping Use  . Vaping Use: Never used  Substance and Sexual Activity  . Alcohol use: No  . Drug use: No  . Sexual activity: Yes    Birth control/protection: Surgical  Other Topics Concern  . Not on file  Social History Narrative  . Not on file   Social Determinants of Health   Financial Resource Strain:   . Difficulty of Paying Living Expenses:   Food Insecurity:   . Worried About Charity fundraiser in the Last Year:   . Arboriculturist in the Last Year:   Transportation Needs:   . Film/video editor (Medical):   Marland Kitchen Lack of Transportation (Non-Medical):   Physical Activity:   . Days of Exercise per Week:   . Minutes of Exercise per Session:   Stress:   . Feeling of Stress :   Social Connections:   . Frequency of Communication with Friends and Family:   . Frequency of Social Gatherings with Friends and Family:   . Attends Religious Services:   . Active Member of Clubs or Organizations:   . Attends Archivist Meetings:   Marland Kitchen Marital Status:   Intimate Partner Violence:   . Fear of Current or Ex-Partner:   . Emotionally Abused:   Marland Kitchen Physically Abused:   . Sexually Abused:     Vital Signs: Blood pressure 130/72, pulse 78, temperature (!) 97.5 F (36.4 C), resp. rate 16, height 5' 5"  (1.651 m), weight 191 lb (86.6 kg), SpO2 96 %.  Examination: General Appearance: The patient is well-developed, well-nourished, and in no distress. Skin: Gross inspection of skin unremarkable. Head: normocephalic, no gross deformities. Eyes: no gross deformities noted. ENT: ears appear grossly normal no exudates. Neck: Supple. No thyromegaly. No LAD. Respiratory: clear bilaterally. Cardiovascular: Normal S1 and S2 without murmur or rub. Extremities: No cyanosis.  pulses are equal. Neurologic: Alert and oriented. No involuntary movements.  LABS: Recent Results (from the past 2160 hour(s))  Urinalysis, Routine w reflex microscopic     Status: Abnormal   Collection Time: 03/22/20  9:51 AM  Result Value Ref Range   Specific Gravity, UA 1.017 1.005 - 1.030   pH, UA 5.5 5.0 - 7.5   Color, UA Yellow Yellow   Appearance Ur Cloudy (A) Clear   Leukocytes,UA Negative Negative   Protein,UA Negative Negative/Trace   Glucose, UA Negative Negative   Ketones, UA Negative Negative   RBC, UA Negative Negative   Bilirubin, UA Negative Negative   Urobilinogen, Ur 0.2 0.2 - 1.0 mg/dL   Nitrite, UA Negative Negative   Microscopic Examination Comment     Comment: Microscopic not indicated and not performed.  HCV Ab Reflex to Quant PCR     Status: None   Collection Time: 03/23/20  9:33 AM  Result Value Ref Range   HCV Ab <0.1 0.0 - 0.9 s/co ratio    Comment: (NOTE) Performed At: [facility] [address] [place], [place] [id number] Rush Farmer MD [id number]   T4, free     Status: None   Collection Time: 03/23/20  9:33 AM  Result Value Ref Range   Free T4 1.02 0.61 - 1.12 ng/dL    Comment: (NOTE) Biotin ingestion may interfere with free T4 tests. If the results are inconsistent with the TSH level, previous test results, or the clinical presentation, then consider biotin interference. If needed, order repeat testing after stopping biotin. Performed at [facility], [place] [address] [place], [place] [address]   TSH     Status: Abnormal   Collection Time: 03/23/20  9:33 AM  Result Value Ref Range   TSH 0.063 (L) 0.350 - 4.500 uIU/mL    Comment: Performed by a 3rd Generation assay with a functional sensitivity of <=0.01 uIU/mL. Performed at [facility], [address], [place], [place] [address]   VITAMIN D 25 Hydroxy (Vit-D Deficiency, Fractures)     Status: None   Collection Time: 03/23/20  9:33 AM  Result Value Ref Range   Vit  D, 25-Hydroxy 45.94 30 - 100 ng/mL    Comment: (NOTE) Vitamin D deficiency has been defined by the [place] practice guideline as a level of serum 25-OH  vitamin D less than 20 ng/mL (1,2). The Endocrine Society went on to  further define vitamin D insufficiency as a level between 21 and 29  ng/mL (2). 1. IOM (Institute of Medicine). 2010. Dietary reference intakes for  calcium and D. [place]: The [employer]. 2. Holick MF, Binkley Eloy, Bischoff-Ferrari HA, et al. Evaluation,  treatment, and prevention of vitamin D deficiency: an Endocrine  Society clinical practice guideline, JCEM. 2011 Jul; 96(7): 1911-30. Performed at [facility], [place] [address], [place] [address]   CBC     Status: None   Collection Time: 03/23/20  9:33 AM  Result Value Ref Range   WBC 4.7 4.0 - 10.5 K/uL   RBC 4.32 3.87 - 5.11 MIL/uL   Hemoglobin 12.4 12.0 - 15.0 g/dL   HCT 38.9 36 - 46 %   MCV 90.0 80.0 - 100.0 fL   MCH 28.7 26.0 - 34.0 pg   MCHC 31.9 30.0 - 36.0 g/dL   RDW 12.9 11.5 - 15.5 %   Platelets 326 150 - 400 K/uL   nRBC 0.0 0.0 - 0.2 %    Comment: Performed at [facility], [address] [place], [place] [address]  Comprehensive metabolic panel     Status: Abnormal   Collection Time: 03/23/20  9:33 AM  Result Value Ref Range   Sodium 141 135 - 145 mmol/L   Potassium 4.0 3.5 - 5.1 mmol/L   Chloride 105 98 - 111 mmol/L   CO2 25 22 - 32 mmol/L   Glucose, Bld 104 (H) 70 - 99 mg/dL    Comment: Glucose reference range applies only to samples taken after fasting for at least 8 hours.   BUN 16 8 - 23 mg/dL   Creatinine, Ser 0.88 0.44 - 1.00 mg/dL   Calcium 9.0 8.9 - 10.3 mg/dL   Total Protein 7.3 6.5 - 8.1 g/dL   Albumin 4.0 3.5 - 5.0 g/dL   AST 23 15 - 41 U/L   ALT 27 0 - 44 U/L   Alkaline Phosphatase 59 38 - 126 U/L   Total Bilirubin 0.5 0.3 - 1.2 mg/dL   GFR calc non Af Amer >60 >60 mL/min   GFR calc Af Amer >60 >60  mL/min   Anion gap 11 5 - 15    Comment: Performed at [facility], [address] [place], [place] [address]  Interpretation:     Status: None   Collection Time: 03/23/20  9:33 AM  Result Value Ref Range   HCV Interp 1: Comment     Comment: (NOTE) Negative Not infected with HCV, unless recent infection is suspected or other evidence exists to indicate HCV infection. Performed At: [facility] [place], [place] [id number] Rush Farmer MD [id number]   Lipid panel     Status: Abnormal   Collection Time: 03/23/20  9:34 AM  Result Value Ref Range   Cholesterol 188 0 - 200 mg/dL   Triglycerides 109 <150 mg/dL   HDL 48 >40 mg/dL   Total CHOL/HDL Ratio 3.9 RATIO   VLDL 22 0 - 40 mg/dL   LDL Cholesterol 118 (H) 0 - 99 mg/dL    Comment:        Total Cholesterol/HDL:CHD Risk Coronary Heart Disease Risk Table                     Men   Women  1/2 Average Risk   3.4   3.3  Average Risk       5.0   4.4  2 X Average Risk   9.6   7.1  3 X Average Risk  23.4   11.0        Use the calculated Patient Ratio above and the CHD Risk Table to determine the patient's CHD Risk.        ATP III CLASSIFICATION (LDL):  <100     mg/dL   Optimal  100-129  mg/dL   Near or Above                    Optimal  130-159  mg/dL   Borderline  160-189  mg/dL   High  >190     mg/dL   Very High Performed at [facility], [address] [place],  [address]     Radiology: No results found.  No results found.  No results found.    Assessment and Plan: Patient Active Problem List   Diagnosis Date Noted  . OSA (obstructive sleep apnea) 03/26/2020  . Chronic wrist pain, right 11/02/2019  . Chronic pain of left wrist 11/02/2019  . Pain in joints of right hand 11/02/2019  . Pain in joint of left hand 11/02/2019  . Primary osteoarthritis of both hands 11/02/2019  . MDD (major depressive disorder),  recurrent, in partial remission ([place]) 10/05/2019  . Dysuria 04/03/2019   . Generalized anxiety disorder 02/28/2019  . Chronic pain of both knees 03/01/2018  . Encounter for general adult medical examination with abnormal findings 03/01/2018  . Primary osteoarthritis of both first carpometacarpal joints 03/01/2018  . Chronic obstructive pulmonary disease, unspecified ([place]) 12/04/2017  . Shortness of breath 12/04/2017  . Allergic rhinitis, unspecified 12/04/2017  . 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 Vaginal atrophy 09/01/2017  . Chronic right-sided low back pain without sciatica 01/05/2017  . Primary Sjogren's syndrome ([place]) 01/05/2017  . CD (celiac disease) 04/17/2015  . Gastroesophageal reflux disease with esophagitis 04/17/2015  . Hx of Sjogren's disease ([place]) 04/17/2015  . Acute asthma exacerbation 10/14/2013  . Acute respiratory failure ([place]) 10/14/2013  . Gastritis 10/08/2013  . Bradycardia 10/07/2013  . Esophageal spasm 10/06/2013  . Substernal precordial chest pain 10/06/2013  . Chronic anxiety 10/06/2013  . GERD (gastroesophageal reflux disease) 10/06/2013  . Chronic asthma 10/06/2013  . 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 Depression with anxiety 10/06/2013  . Fibromyalgia 10/06/2013  . Hypothyroidism 10/06/2013  . Chronic migraine 10/06/2013   1. OSA (obstructive sleep apnea) Have cpap delivered, and follow up in one month.  - For home use only DME continuous positive airway pressure (CPAP)  2. Mild intermittent chronic asthma without complication Stable, continue current management.   3. Gastroesophageal reflux disease without esophagitis Stable, continue current management.   General Counseling: I have discussed the findings of the evaluation and examination with Glorious.  I have also discussed any further diagnostic evaluation thatmay be needed or ordered today. Hayde verbalizes understanding of the findings of todays visit. We also reviewed her medications today and discussed drug  interactions and side effects including but not limited excessive drowsiness and altered mental states. We also discussed that there is always a risk not just to her but also people around her. she has been encouraged to call the office with any questions or concerns that should arise related to todays visit.  No orders of the defined types were placed in this encounter.    Time spent: 25 This patient was seen by Orson Gear AGNP-C in Collaboration with Dr. Devona Konig as a part of collaborative care agreement.   I have personally obtained a history, examined the patient, evaluated laboratory and imaging results, formulated the assessment and plan and placed orders.    Allyne Gee, MD [facility] Pulmonary and Critical Care Sleep medicine

## 2020-05-04 ENCOUNTER — Ambulatory Visit: Payer: Medicare HMO

## 2020-05-04 DIAGNOSIS — E038 Other specified hypothyroidism: Secondary | ICD-10-CM

## 2020-05-07 ENCOUNTER — Telehealth: Payer: Self-pay

## 2020-05-07 NOTE — Telephone Encounter (Signed)
Gave RX for cpap set up to [name]. Susan [name]

## 2020-05-10 ENCOUNTER — Telehealth: Payer: Self-pay

## 2020-05-10 NOTE — Telephone Encounter (Signed)
Lmom to confirm and screen for 05-15-20 ov. 

## 2020-05-11 ENCOUNTER — Ambulatory Visit: Payer: Medicare HMO

## 2020-05-11 DIAGNOSIS — I8393 Asymptomatic varicose veins of bilateral lower extremities: Secondary | ICD-10-CM

## 2020-05-13 NOTE — Procedures (Signed)
[facility] [address] [place], [place] [address]  Patient Name: Susan [name] DOB: 15-Aug-1956   SLEEP STUDY INTERPRETATION  DATE OF SERVICE: April 03, 2020   SLEEP STUDY HISTORY: This patient is referred to the sleep lab for a baseline Polysomnography. Pertinent history includes a history of diagnosis of excessive daytime somnolence and snoring.  PROCEDURE: This overnight polysomnogram was performed using the Alice 5 acquisition system using the standard diagnostic protocol as outlined by the AASM. This includes 6 channels of EEG, 2 channelscannels of EOG, chin EMG, bilateral anterior tibialis EMG, nasal/oral thermister, PTAF, chest and abdominal wall movements, ECG and pulse oximetry. Apneas and Hypopneas were scored per AASM definition.  SLEEP ARCHITECHTURE: This is a baseline polysomnograph  study. The total recording time was 402.5 minutes and the patients total sleep time is noted to be 231.5 minutes. Sleep onset latency was 120.0 minutes and is prolonged.  Stage R sleep onset latency was not noted. Sleep maintenance efficiency was 57.6% and is decreased.  Sleep staging expressed as a percentage of total sleep time demonstrated 8.2% N1, 91.8% N2 and 0% N3  sleep. Stage R represents 0% of total sleep time. This is abnormal.  There were a total of 6 arousals  for an overall arousal index of 1.6 per hour of sleep. PLMS arousal would not noted. Arousals without respiratory events are  noted. This can contribute to sleep architechture disruption.  RESPIRATORY MONITORING:   Patient exhibits insignificant evidence of sleep disorderd breathing characterized by 4 central apneas, 0 obstructive apneas and 1 mixed apneas. There were 2 obstructive hypopneas and 0 RERAs. Most of the apneas/hypopneas were of mixed and central variety. The total apnea hypopnea index (apneas and hypopneas per hour of sleep) is 1.8 respiratory events per hour and is within normal limits.  Respiratory  monitoring demonstrated very mild snoring through the night. There are a total of only 3 snoring episodes representing 0.1% of sleep.   Baseline oxygen saturation during wakefulness was 91% and during NREM sleep averaged 92% through the night. There was some significant  oxygen desaturation with the respiratory events. Arterial oxygen desaturation occurred of at least 4% was noted with a low saturation of 88%. The study was performed off oxygen.  CARDIAC MONITORING:   Average heart rate is 68 during sleep with a high of 87 beats per minute. Malignant arrhythmias were not noted.  CPAP titration: Patient was started on a CPAP pressure of 5 and increased up to a CPAP pressure of 9 CWP.  Patient exhibited adequate sleep on all the pressures.  It appears a pressure of 9 CWP.  Should not achieved 59.6 minutes of sleep heavily 1 apnea noted hypopneas for an AHI of 1.0.  The lowest oxygen saturation was 92%  IMPRESSIONS:  --This overnight polysomnogram demonstrates insignificant sleep apnea with an overall AHI 1.8 per hour. --The patient did not exhibit stage R sleep. --There were associated some arterial oxygen desaturations noted with lowest saturation of 88% --There was no significant PLMS noted in this study. --There is very mild snoring noted throughout the study.    RECOMMENDATIONS:  --CPAP titration study is adequate to control this patient's obstructive sleep apnea.  The optimal pressure appears to be 9 CWP. --Nasal decongestants and antihistamines may be of help for increased upper airways resistance when present. --Weight loss through dietary and lifestyle modification is recommended in the presence of obesity. --A search for and treatment of any underlying cardiopulmonary disease is  recommended in the presence of oxygen desaturations. --Alternative treatment options if the patient is not willing to use CPAP include oral   appliances as well as surgical intervention which may help in  the appropriate patient. --Clinical correlation is recommended. Please feel free to call the office for any further  questions or assistance in the care of this patient.     Allyne Gee, MD [facility] Pulmonary Critical Care Medicine Sleep medicine

## 2020-05-13 NOTE — Procedures (Signed)
[facility] [address] [place], [place] [address]  Patient Name: Susan [name] DOB: [birth year]   SLEEP STUDY INTERPRETATION  DATE OF SERVICE: Feb 29, 2020   SLEEP STUDY HISTORY: This patient is referred to the sleep lab for a baseline Polysomnography. Pertinent history includes a history of diagnosis of excessive daytime somnolence and snoring.  PROCEDURE: This overnight polysomnogram was performed using the Alice 5 acquisition system using the standard diagnostic protocol as outlined by the AASM. This includes 6 channels of EEG, 2 channelscannels of EOG, chin EMG, bilateral anterior tibialis EMG, nasal/oral thermister, PTAF, chest and abdominal wall movements, ECG and pulse oximetry. Apneas and Hypopneas were scored per AASM definition.  SLEEP ARCHITECHTURE: This is a baseline polysomnograph  study. The total recording time was 441.0 minutes and the patients total sleep time is noted to be 130.0 minutes. Sleep onset latency was 157.7 minutes and is prolonged.  Stage R sleep onset latency was not noted. Sleep maintenance efficiency was 29.6% and is decreased.  Sleep staging expressed as a percentage of total sleep time demonstrated 6.2% N1, 67.7% N2 and 26.2% N3  sleep. Stage R represents 0% of total sleep time. This is decreased.  There were a total of 18 arousals  for an overall arousal index of 8.3 per hour of sleep. PLMS arousal were noted. Arousals without respiratory events are  noted. This can contribute to sleep architechture disruption.  RESPIRATORY MONITORING:   Patient exhibits very mild evidence of sleep disorderd breathing characterized by 0 central apneas, 0 obstructive apneas and 0 mixed apneas. There were 12 obstructive hypopneas and 0 RERAs. Most of the apneas/hypopneas were of obstructive variety. The total apnea hypopnea index (apneas and hypopneas per hour of sleep) is 5.5 respiratory events per hour and is mild.  Respiratory monitoring demonstrated mild  snoring through the night. There are a total of 60 snoring episodes representing 6.5% of sleep.   Baseline oxygen saturation during wakefulness was 95% and during NREM sleep averaged 95% through the night.  There was significant  oxygen desaturation with the respiratory events. Arterial oxygen desaturation occurred of at least 4% was noted with a low saturation of 86%. The study was performed off oxygen.  CARDIAC MONITORING:   Average heart rate is 65 during sleep with a high of 100 beats per minute. Malignant arrhythmias were not noted.    IMPRESSIONS:  --This overnight polysomnogram demonstrates presence of mild obstructive sleep apnea with an overall AHI 5.5 per hour. --The overall AHI was no worse  during Stage R. --There were associated was arterial oxygen desaturations noted with lowest saturation of 86% --There was very minor and insignificant PLMS noted in this study. --There is mild snoring noted throughout the study.    RECOMMENDATIONS:  --CPAP titration study is possibly indicated depending on the patient's ongoing symptomology.  Patient should be noted did not exhibit adequate sleep in this case clinical correlation is strongly recommended. --Nasal decongestants and antihistamines may be of help for increased upper airways resistance when present. --Weight loss through dietary and lifestyle modification is recommended in the presence of obesity. --A search for and treatment of any underlying cardiopulmonary disease is      recommended in the presence of oxygen desaturations. --Alternative treatment options if the patient is not willing to use CPAP include oral   appliances as well as surgical intervention which may help in the appropriate patient. --Clinical correlation is recommended. Please feel free to call the office for any further  questions or assistance in the care of this patient.     Allyne Gee, MD [facility] Pulmonary Critical Care Medicine Sleep medicine

## 2020-05-15 ENCOUNTER — Ambulatory Visit: Payer: Medicare HMO | Admitting: Nurse Practitioner

## 2020-05-17 DIAGNOSIS — R69 Illness, unspecified: Secondary | ICD-10-CM | POA: Diagnosis not present

## 2020-05-22 ENCOUNTER — Telehealth: Payer: Self-pay

## 2020-05-22 NOTE — Telephone Encounter (Signed)
Confirmed appointment on 05/23/2020 and screened for covid. kl[facility] 

## 2020-05-23 ENCOUNTER — Other Ambulatory Visit: Payer: Self-pay

## 2020-05-23 ENCOUNTER — Ambulatory Visit (INDEPENDENT_AMBULATORY_CARE_PROVIDER_SITE_OTHER): Payer: Medicare HMO

## 2020-05-23 DIAGNOSIS — G4733 Obstructive sleep apnea (adult) (pediatric): Secondary | ICD-10-CM

## 2020-05-24 ENCOUNTER — Telehealth: Payer: Self-pay

## 2020-05-24 NOTE — Telephone Encounter (Signed)
Confirmed and screened for 05-28-20 ov.

## 2020-05-28 ENCOUNTER — Other Ambulatory Visit: Payer: Self-pay

## 2020-05-28 ENCOUNTER — Telehealth (HOSPITAL_COMMUNITY): Payer: Self-pay

## 2020-05-28 ENCOUNTER — Other Ambulatory Visit (HOSPITAL_COMMUNITY): Payer: Self-pay | Admitting: Psychiatry

## 2020-05-28 ENCOUNTER — Ambulatory Visit (INDEPENDENT_AMBULATORY_CARE_PROVIDER_SITE_OTHER): Payer: Medicare HMO | Admitting: Hospice and Palliative Medicine

## 2020-05-28 ENCOUNTER — Encounter: Payer: Self-pay | Admitting: Nurse Practitioner

## 2020-05-28 VITALS — BP 118/78 | HR 67 | Temp 97.3°F | Resp 16 | Ht 65.0 in | Wt 188.8 lb

## 2020-05-28 DIAGNOSIS — R591 Generalized enlarged lymph nodes: Secondary | ICD-10-CM | POA: Diagnosis not present

## 2020-05-28 DIAGNOSIS — K219 Gastro-esophageal reflux disease without esophagitis: Secondary | ICD-10-CM

## 2020-05-28 DIAGNOSIS — I83813 Varicose veins of bilateral lower extremities with pain: Secondary | ICD-10-CM | POA: Diagnosis not present

## 2020-05-28 DIAGNOSIS — R9389 Abnormal findings on diagnostic imaging of other specified body structures: Secondary | ICD-10-CM

## 2020-05-28 MED ORDER — ALPRAZOLAM 0.5 MG PO TABS: 0.5000 mg | ORAL_TABLET | Freq: Three times a day (TID) | ORAL | 1 refills | Status: DC | PRN

## 2020-05-28 NOTE — Progress Notes (Addendum)
[facility] [place], [place] [address]  Internal MEDICINE  Office Visit Note  Patient Name: Susan [name]  [id number]  [id number]  Date of Service: 06/04/2020  Chief Complaint  Patient presents with  . Follow-up    ultrasound results  . Depression  . Hyperlipidemia  . Gastroesophageal Reflux    HPI Patient is here to follow-up on her chronic medical conditions.  She recently had an ultrasound of her thyroid and would like to review the results. She had her thyroid removed in 1990 for thyroid cancer at that time. The results of this ultrasound revealed a few lymph nodes, one on her right side posterior to jugular vein that needs close follow-up. Lymph node found is 1 centimeter in diameter. Also had a venous ultrasound of bilateral lower extremities that revealed varicose veins but no findings of DVT. Continues to have severe pain in bilateral lower extremities that prevents her from walking longer distances. Has been on gabapentin for some time with no relief of this pain. Feels the gabapentin does alleviate burning and tingling sensation from neuropathy. Would like to be referred to vascular surgery at this time for varicosities. GERD and HLD stable at this time.  Current Medication: Outpatient Encounter Medications as of 05/28/2020  Medication Sig  . acetaminophen (TYLENOL) 500 MG tablet Take by mouth.  Marland Kitchen albuterol (PROVENTIL HFA;VENTOLIN HFA) 108 (90 Base) MCG/ACT inhaler Inhale 2 puffs into the lungs every 6 (six) hours as needed for wheezing or shortness of breath.  Marland Kitchen albuterol (PROVENTIL) (2.5 MG/3ML) 0.083% nebulizer solution Take 3 mLs (2.5 mg total) by nebulization every 6 (six) hours as needed for wheezing or shortness of breath.  . ALPRAZolam (XANAX) 0.5 MG tablet Take 1 tablet (0.5 mg total) by mouth 3 (three) times daily as needed for sleep or anxiety.  Marland Kitchen amitriptyline (ELAVIL) 25 MG tablet Take 1 tablet (25 mg total) by mouth at bedtime.  .  Ascorbic Acid (VITAMIN C) 100 MG tablet Take 100 mg by mouth daily.  . calcium carbonate (OS-CAL) 1250 (500 Ca) MG chewable tablet Chew by mouth.  . carisoprodol (SOMA) 350 MG tablet Take 350 mg by mouth at bedtime. Patient states that she takes 1/2 tablet nightly  . CLIMARA 0.1 MG/24HR patch APPLY 1 PATCH TO SKIN ONCE WEEKLY  . Dentifrices (BIOTENE DRY MOUTH DT) by Transmucosal route.  . doxycycline (VIBRAMYCIN) 50 MG capsule Take one cap po QD-BID. Take with food.  . doxycycline (VIBRAMYCIN) 50 MG capsule Take 1 capsule (50 mg total) by mouth daily.  Marland Kitchen eletriptan (RELPAX) 40 MG tablet Take 40 mg by mouth as needed for migraine. may repeat in 2 hours if necessary   . EPINEPHRINE 0.3 mg/0.3 mL IJ SOAJ injection USE AS DIRECTED  . ezetimibe (ZETIA) 10 MG tablet Take 1 tablet (10 mg total) by mouth daily.  . famotidine (PEPCID) 10 MG tablet Take 20 mg by mouth.  . fenofibrate (TRICOR) 145 MG tablet Take 1 tablet (145 mg total) by mouth daily.  . fluticasone (FLONASE) 50 MCG/ACT nasal spray Place 2 sprays into both nostrils daily.  . fluticasone furoate-vilanterol (BREO ELLIPTA) 100-25 MCG/INH AEPB Inhale 1 puff into the lungs daily.  Marland Kitchen gabapentin (NEURONTIN) 600 MG tablet Take 1/2 (one-half) tablet by mouth twice daily  . HYDROcodone-acetaminophen (NORCO) 7.5-325 MG tablet Take 1-2 tablets by mouth every 6 (six) hours as needed for moderate pain.  . hydroxychloroquine (PLAQUENIL) 200 MG tablet Take by mouth 2 (two) times daily.  Marland Kitchen  hyoscyamine (NULEV) 0.125 MG TBDP Place 0.125 mg under the tongue every 4 (four) hours as needed for bladder spasms.   . Lactobacillus (DIGESTIVE HEALTH PROBIOTIC PO) Take 1 tablet by mouth daily.  Marland Kitchen levothyroxine (SYNTHROID) 137 MCG tablet Take 1 tablet (137 mcg total) by mouth daily before breakfast.  . LINZESS 290 MCG CAPS capsule Take 290 mcg by mouth daily.  . Magnesium Oxide (NATRUL MAGNESIUM PO) Take by mouth.  . montelukast (SINGULAIR) 10 MG tablet TAKE ONE  TABLET BY MOUTH ONCE DAILY FOR ASTHMA  . Multiple Vitamins-Minerals (HAIR SKIN AND NAILS FORMULA PO) Take by mouth.  . mupirocin ointment (BACTROBAN) 2 % as needed.   . pantoprazole (PROTONIX) 40 MG tablet Take 40 mg by mouth daily.  Vladimir Faster Glycol-Propyl Glycol 0.4-0.3 % SOLN Apply to eye.  Marland Kitchen PRESCRIPTION MEDICATION 1 Syringe by Subdermal route once a week. Allergy shot  . Probiotic Product ([place]) CAPS Take by mouth.  . sodium chloride (OCEAN) 0.65 % SOLN nasal spray Place 1 spray into both nostrils as needed for congestion.  . sodium chloride 0.9 % nebulizer solution   . SUMAtriptan-naproxen (TREXIMET) 85-500 MG tablet daily as needed.   . Tuberculin-Allergy Syringes 28G X 1/2" 1 ML MISC 1 each by Does not apply route every 14 (fourteen) days. For allergy injections  . Vaginal Lubricant (REPLENS) GEL Place vaginally.  . venlafaxine XR (EFFEXOR-XR) 150 MG 24 hr capsule Take 2 capsules (300 mg total) by mouth at bedtime.  . Vitamin D, Ergocalciferol, (DRISDOL) 1.25 MG (50000 UNIT) CAPS capsule TAKE 1 CAPSULE (50,000 UNITS TOTAL) BY MOUTH EVERY 7 (SEVEN) DAYS. MONDAY  . vitamin E 1000 UNIT capsule Take by mouth.  Marland Kitchen White Petrolatum-Mineral Oil ([place] PETROL-MINERAL OIL-LANOLIN) 0.1-0.1 % OINT   . [DISCONTINUED] ALPRAZolam (XANAX) 0.5 MG tablet Take 1 tablet (0.5 mg total) by mouth 3 (three) times daily as needed for anxiety.   No facility-administered encounter medications on file as of 05/28/2020.    Surgical History: Past Surgical History:  Procedure Laterality Date  . ABDOMINAL HYSTERECTOMY  2006  . BLADDER REPAIR    . BREAST CYST ASPIRATION Left yrs ago  . CHOLECYSTECTOMY  1989  . COLONOSCOPY WITH ESOPHAGOGASTRODUODENOSCOPY (EGD)    . COLONOSCOPY WITH PROPOFOL N/A 08/03/2017   Procedure: COLONOSCOPY WITH PROPOFOL;  Surgeon: Lollie Sails, MD;  Location: [facility] ENDOSCOPY;  Service: Endoscopy;  Laterality: N/A;  . COLONOSCOPY WITH PROPOFOL N/A 10/18/2018    Procedure: COLONOSCOPY WITH PROPOFOL;  Surgeon: Lollie Sails, MD;  Location: [facility] ENDOSCOPY;  Service: Endoscopy;  Laterality: N/A;  . ESOPHAGOGASTRODUODENOSCOPY N/A 10/07/2013   Procedure: ESOPHAGOGASTRODUODENOSCOPY (EGD);  Surgeon: Danie Binder, MD;  Location: [facility] ENDO SUITE;  Service: Endoscopy;  Laterality: N/A;  patient received heparin at 530am given phenergan 28m IV 30 minutes before  . EYE SURGERY     lacrimal gland   . FLEXIBLE BRONCHOSCOPY N/A 06/04/2016   Procedure: FLEXIBLE BRONCHOSCOPY;  Surgeon: SAllyne Gee MD;  Location: [facility] ORS;  Service: Pulmonary;  Laterality: N/A;  . INCONTINENCE SURGERY  2004  . NASAL SINUS SURGERY    . THYROID LOBECTOMY    . TOTAL THYROIDECTOMY      Medical History: Past Medical History:  Diagnosis Date  . Arthritis   . Asthma   . Asthmatic bronchitis   . BRCA negative 05/2014   MyRisk neg  . Celiac disease   . Celiac sprue   . Chronic anxiety   . Complication of anesthesia  vomiting  . COPD (chronic obstructive pulmonary disease) ([place])   . Depression   . Environmental allergies   . Family history of breast cancer 05/2014   MyRisk neg; IBIS=29.5%  . Fibromyalgia    neuropathy all over  . Gastritis 10/08/2013  . GERD (gastroesophageal reflux disease)   . Hyperlipemia   . Hyperlipidemia    Intolerant to statins  . Hypothyroidism (acquired)   . Increased risk of breast cancer 05/2014   IBIS=29.5%  . Increased risk of breast cancer 05/2014   IBIS=29.5%  . Migraine   . Migraines   . 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 Pancreatitis   . Sjogren's disease ([place])   . Spastic colon   . Thyroid cancer ([place]) 1990 and 1994   Total thyroidectomy with radioactive iodine.     Family History: Family History  Problem Relation Age of Onset  . Arthritis/Rheumatoid Mother        died in age 31s  . Breast cancer Mother 65  . Breast cancer Maternal Grandmother        40's  . Breast cancer Maternal Aunt        X 2. 50's  . Lung  cancer Maternal Aunt   . Pancreatitis Neg Hx   . Colon cancer Neg Hx     Social History   Socioeconomic History  . Marital status: Married    Spouse name: Not on file  . Number of children: 0  . Years of education: Not on file  . Highest education level: Not on file  Occupational History  . Not on file  Tobacco Use  . Smoking status: Never Smoker  . Smokeless tobacco: Never Used  Vaping Use  . Vaping Use: Never used  Substance and Sexual Activity  . Alcohol use: No  . Drug use: No  . Sexual activity: Yes    Birth control/protection: Surgical  Other Topics Concern  . Not on file  Social History Narrative  . Not on file   Social Determinants of Health   Financial Resource Strain:   . Difficulty of Paying Living Expenses:   Food Insecurity:   . Worried About Charity fundraiser in the Last Year:   . Arboriculturist in the Last Year:   Transportation Needs:   . Film/video editor (Medical):   Marland Kitchen Lack of Transportation (Non-Medical):   Physical Activity:   . Days of Exercise per Week:   . Minutes of Exercise per Session:   Stress:   . Feeling of Stress :   Social Connections:   . Frequency of Communication with Friends and Family:   . Frequency of Social Gatherings with Friends and Family:   . Attends Religious Services:   . Active Member of Clubs or Organizations:   . Attends Archivist Meetings:   Marland Kitchen Marital Status:   Intimate Partner Violence:   . Fear of Current or Ex-Partner:   . Emotionally Abused:   Marland Kitchen Physically Abused:   . Sexually Abused:     Review of Systems  Constitutional: Negative for chills, diaphoresis and fatigue.  HENT: Negative for ear pain, postnasal drip and sinus pressure.   Eyes: Negative for photophobia, discharge, redness, itching and visual disturbance.  Respiratory: Negative for cough, shortness of breath and wheezing.   Cardiovascular: Negative for chest pain, palpitations and leg swelling.  Gastrointestinal: Negative  for abdominal pain, constipation, diarrhea, nausea and vomiting.  Genitourinary: Negative for dysuria and flank pain.  Musculoskeletal: Positive for  myalgias. Negative for arthralgias, back pain, gait problem and neck pain.  Skin: Negative for color change.  Allergic/Immunologic: Negative for environmental allergies and food allergies.  Neurological: Negative for dizziness and headaches.  Hematological: Does not bruise/bleed easily.  Psychiatric/Behavioral: Negative for agitation, behavioral problems (depression) and hallucinations.    Vital Signs: BP 118/78   Pulse 67   Temp (!) 97.3 F (36.3 C)   Resp 16   Ht 5' 5"  (1.651 m)   Wt 188 lb 12.8 oz (85.6 kg)   SpO2 97%   BMI 31.42 kg/m    Physical Exam Constitutional:      General: She is not in acute distress.    Appearance: She is well-developed. She is not diaphoretic.  HENT:     Head: Normocephalic and atraumatic.     Mouth/Throat:     Pharynx: No oropharyngeal exudate.  Eyes:     Pupils: Pupils are equal, round, and reactive to light.  Neck:     Thyroid: No thyromegaly.     Vascular: No JVD.     Trachea: No tracheal deviation.  Cardiovascular:     Rate and Rhythm: Normal rate and regular rhythm.     Heart sounds: Normal heart sounds. No murmur heard.  No friction rub. No gallop.   Pulmonary:     Effort: Pulmonary effort is normal. No respiratory distress.     Breath sounds: No wheezing or rales.  Chest:     Chest wall: No tenderness.  Abdominal:     General: Bowel sounds are normal.     Palpations: Abdomen is soft.  Musculoskeletal:        General: Normal range of motion.     Cervical back: Normal range of motion and neck supple.  Lymphadenopathy:     Cervical: No cervical adenopathy.  Skin:    General: Skin is warm and dry.  Neurological:     Mental Status: She is alert and oriented to person, place, and time.     Cranial Nerves: No cranial nerve deficit.  Psychiatric:        Behavior: Behavior normal.         Thought Content: Thought content normal.        Judgment: Judgment normal.    Assessment/Plan: 1. Varicose veins of bilateral lower extremities with pain Bilateral extremity venous US negative for DVT, varicosities seen. Continues to have severe burning and pain in bilateral legs with ambulation. Refer to vascular surgery at this time, will continue to follow-up. - Ambulatory referral to Vascular Surgery  2. Lymphadenopathy of head and neck US found right sided lymph node 1 CM in diameter behind jugular vein with poor corticomedullary junction. Will plan to have follow-up US completed in December as she and her husband live in [place] starting next month. - US Soft Tissue Head/Neck (NON-THYROID); Future  3. Gastroesophageal reflux disease without esophagitis Stable at this time, continue with current therapy.  General Counseling: jerita wimbush understanding of the findings of todays visit and agrees with plan of treatment. I have discussed any further diagnostic evaluation that may be needed or ordered today. We also reviewed her medications today. she has been encouraged to call the office with any questions or concerns that should arise related to todays visit.   Orders Placed This Encounter  Procedures  . US Soft Tissue Head/Neck (NON-THYROID)  . Ambulatory referral to Vascular Surgery    No orders of the defined types were placed in this encounter.   Total time  spent: 35 Minutes  This patient was seen by Theodoro Grist, AGNP-C in collaboration with Dr. Lavera Guise as part of a collaborative care agreement.   Time spent includes review of chart, medications, test results, and follow up plan with the patient.   Theodoro Grist, DNP, AGNP-C  Dr Lavera Guise Internal medicine

## 2020-05-28 NOTE — Telephone Encounter (Signed)
Ordered

## 2020-05-28 NOTE — Telephone Encounter (Signed)
I have utilized the [place] Controlled Substances Reporting System (PMP AWARxE) to confirm adherence regarding the patient's medication. My review reveals appropriate prescription fills.

## 2020-05-28 NOTE — Telephone Encounter (Signed)
Medication refill request - Fax refill request from pt's [facility] in [place], [place] for a refill of Alprazolam. Called and spoke wiht Vaughan Basta, pharmacist that stated this was last filled on 04/30/20 and that they do not have another order on file from 05/01/20.  Collateral stated patient had the medication filled 03/03/20, 04/03/20, and 04/30/20.  Pharmacist stated she will need a new order and patient does not return for next evaluation until 07/26/20.

## 2020-05-30 ENCOUNTER — Telehealth: Payer: Self-pay

## 2020-05-30 DIAGNOSIS — Z1231 Encounter for screening mammogram for malignant neoplasm of breast: Secondary | ICD-10-CM

## 2020-05-30 NOTE — Telephone Encounter (Signed)
Pt calling to see if [facility] will put in an order for her to have her mammogram so she can get it before her appt c him in Dec.  Pt leaves 10/27/20 for [place] for the winter.  [phone]

## 2020-05-30 NOTE — Progress Notes (Signed)
New Cpap Setup   She was setup with a resmed airsense S-10 cpap with heated humidifier, filter, climate line and nasal mask. She had a good understand of compliance and cleaning cpap and not using any cleaning devices such as so clean.

## 2020-06-06 ENCOUNTER — Telehealth (HOSPITAL_COMMUNITY): Payer: Self-pay | Admitting: *Deleted

## 2020-06-06 NOTE — Telephone Encounter (Signed)
Patient wanting to cancel her appt for 9-30 and shes leaving for [place] 9-1 or 2. Per pt she is needing to get her medications before she leave. Per pt provider prints her script for her and have done it every time she leaves for [place].

## 2020-06-06 NOTE — Telephone Encounter (Signed)
I do not see any follow up with me at this time. Please call her to schedule follow up appointment. If she is unable to have follow up by Nov 1st, please schedule her before she goes to [place] so that she can have medication.

## 2020-06-06 NOTE — Telephone Encounter (Signed)
Informed patient with results and she verbalized understanding.

## 2020-06-07 DIAGNOSIS — R69 Illness, unspecified: Secondary | ICD-10-CM | POA: Diagnosis not present

## 2020-06-19 ENCOUNTER — Other Ambulatory Visit (INDEPENDENT_AMBULATORY_CARE_PROVIDER_SITE_OTHER): Payer: Self-pay | Admitting: Nurse Practitioner

## 2020-06-19 DIAGNOSIS — I83813 Varicose veins of bilateral lower extremities with pain: Secondary | ICD-10-CM

## 2020-06-20 ENCOUNTER — Ambulatory Visit: Payer: Medicare HMO

## 2020-06-20 ENCOUNTER — Ambulatory Visit (INDEPENDENT_AMBULATORY_CARE_PROVIDER_SITE_OTHER): Payer: Medicare HMO | Admitting: Nurse Practitioner

## 2020-06-20 ENCOUNTER — Encounter (INDEPENDENT_AMBULATORY_CARE_PROVIDER_SITE_OTHER): Payer: Self-pay | Admitting: Nurse Practitioner

## 2020-06-20 ENCOUNTER — Ambulatory Visit (INDEPENDENT_AMBULATORY_CARE_PROVIDER_SITE_OTHER): Payer: Medicare HMO

## 2020-06-20 ENCOUNTER — Other Ambulatory Visit: Payer: Self-pay

## 2020-06-20 VITALS — BP 123/74 | HR 78 | Resp 16 | Ht 65.5 in | Wt 190.0 lb

## 2020-06-20 DIAGNOSIS — M797 Fibromyalgia: Secondary | ICD-10-CM | POA: Insufficient documentation

## 2020-06-20 DIAGNOSIS — M069 Rheumatoid arthritis, unspecified: Secondary | ICD-10-CM | POA: Insufficient documentation

## 2020-06-20 DIAGNOSIS — I83813 Varicose veins of bilateral lower extremities with pain: Secondary | ICD-10-CM

## 2020-06-20 DIAGNOSIS — G43019 Migraine without aura, intractable, without status migrainosus: Secondary | ICD-10-CM | POA: Insufficient documentation

## 2020-06-20 DIAGNOSIS — J301 Allergic rhinitis due to pollen: Secondary | ICD-10-CM

## 2020-06-20 DIAGNOSIS — M79605 Pain in left leg: Secondary | ICD-10-CM | POA: Diagnosis not present

## 2020-06-20 DIAGNOSIS — M79604 Pain in right leg: Secondary | ICD-10-CM | POA: Diagnosis not present

## 2020-06-20 DIAGNOSIS — G250 Essential tremor: Secondary | ICD-10-CM | POA: Insufficient documentation

## 2020-06-20 NOTE — Progress Notes (Signed)
Virtual Visit via Video Note  I connected with Susan [name] on 06/25/20 at 11:20 AM EDT by a video enabled telemedicine application and verified that I am speaking with the correct person using two identifiers.   I discussed the limitations of evaluation and management by telemedicine and the availability of in person appointments. The patient expressed understanding and agreed to proceed.     I discussed the assessment and treatment plan with the patient. The patient was provided an opportunity to ask questions and all were answered. The patient agreed with the plan and demonstrated an understanding of the instructions.   The patient was advised to call back or seek an in-person evaluation if the symptoms worsen or if the condition fails to improve as anticipated.  Location: patient- home, provider- office   I provided 15 minutes of non-face-to-face time during this encounter.   [name], MD    [facility] MD/PA/NP OP Progress Note  06/25/2020 12:06 PM Susan [name]  MRN:  [id number]  Chief Complaint:  Chief Complaint    Follow-up; Anxiety     HPI:  This is a follow-up appointment for depression and anxiety.  She states that she has been doing well.  She helps her friend, who has a lot of chickens. She takes care of her dog. She reports good relationship with her husband. She reports concern of tapering down Xanax as the current regimen works very well for her. She reports insomnia when she does not take Xanax. She also reports that it has been helpful for muscle tension she has at night. Although the option of starting quetiapine to target depression/anxiety/insomnia is discussed, she has concern of being on another medication as she already takes several medication. She sleeps well on CPAP machine and takes xanax. She denies feeling depressed. She has good energy and motivation.  She has fair concentration.  She has good appetite.  She denies SI.  She denies anxiety.  She denies  panic attacks.    Visit Diagnosis:    ICD-10-CM   1. MDD (major depressive disorder), recurrent, in full remission ([place])  F33.42   2. Generalized anxiety disorder  F41.1     Past Psychiatric History: Please see initial evaluation for full details. I have reviewed the history. No updates at this time.     Past Medical History:  Past Medical History:  Diagnosis Date  . Arthritis   . Asthma   . Asthmatic bronchitis   . BRCA negative 05/2014   MyRisk neg  . Celiac disease   . Celiac sprue   . Chronic anxiety   . Complication of anesthesia    vomiting  . COPD (chronic obstructive pulmonary disease) ([place])   . Depression   . Environmental allergies   . Family history of breast cancer 05/2014   MyRisk neg; IBIS=29.5%  . Fibromyalgia    neuropathy all over  . Gastritis 10/08/2013  . GERD (gastroesophageal reflux disease)   . Hyperlipemia   . Hyperlipidemia    Intolerant to statins  . Hypothyroidism (acquired)   . Increased risk of breast cancer 05/2014   IBIS=29.5%  . Increased risk of breast cancer 05/2014   IBIS=29.5%  . Migraine   . Migraines   . 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 Pancreatitis   . Sjogren's disease ([place])   . Spastic colon   . Thyroid cancer ([place]) 1990 and 1994   Total thyroidectomy with radioactive iodine.     Past Surgical History:  Procedure Laterality Date  .  ABDOMINAL HYSTERECTOMY  2006  . BLADDER REPAIR    . BREAST CYST ASPIRATION Left yrs ago  . CHOLECYSTECTOMY  1989  . COLONOSCOPY WITH ESOPHAGOGASTRODUODENOSCOPY (EGD)    . COLONOSCOPY WITH PROPOFOL N/A 08/03/2017   Procedure: COLONOSCOPY WITH PROPOFOL;  Surgeon: Lollie Sails, MD;  Location: [facility] ENDOSCOPY;  Service: Endoscopy;  Laterality: N/A;  . COLONOSCOPY WITH PROPOFOL N/A 10/18/2018   Procedure: COLONOSCOPY WITH PROPOFOL;  Surgeon: Lollie Sails, MD;  Location: [facility] ENDOSCOPY;  Service: Endoscopy;  Laterality: N/A;  . ESOPHAGOGASTRODUODENOSCOPY N/A 10/07/2013   Procedure:  ESOPHAGOGASTRODUODENOSCOPY (EGD);  Surgeon: Danie Binder, MD;  Location: [facility] ENDO SUITE;  Service: Endoscopy;  Laterality: N/A;  patient received heparin at 530am given phenergan 52m IV 30 minutes before  . EYE SURGERY     lacrimal gland   . FLEXIBLE BRONCHOSCOPY N/A 06/04/2016   Procedure: FLEXIBLE BRONCHOSCOPY;  Surgeon: SAllyne Gee MD;  Location: [facility] ORS;  Service: Pulmonary;  Laterality: N/A;  . INCONTINENCE SURGERY  2004  . NASAL SINUS SURGERY    . THYROID LOBECTOMY    . TOTAL THYROIDECTOMY      Family Psychiatric History: Please see initial evaluation for full details. I have reviewed the history. No updates at this time.     Family History:  Family History  Problem Relation Age of Onset  . Arthritis/Rheumatoid Mother        died in age 6386s . Breast cancer Mother 414 . Breast cancer Maternal Grandmother        40's  . Breast cancer Maternal Aunt        X 2. 50's  . Lung cancer Maternal Aunt   . Pancreatitis Neg Hx   . Colon cancer Neg Hx     Social History:  Social History   Socioeconomic History  . Marital status: Married    Spouse name: Not on file  . Number of children: 0  . Years of education: Not on file  . Highest education level: Not on file  Occupational History  . Not on file  Tobacco Use  . Smoking status: Never Smoker  . Smokeless tobacco: Never Used  Vaping Use  . Vaping Use: Never used  Substance and Sexual Activity  . Alcohol use: No  . Drug use: No  . Sexual activity: Yes    Birth control/protection: Surgical  Other Topics Concern  . Not on file  Social History Narrative  . Not on file   Social Determinants of Health   Financial Resource Strain:   . Difficulty of Paying Living Expenses: Not on file  Food Insecurity:   . Worried About RCharity fundraiserin the Last Year: Not on file  . Ran Out of Food in the Last Year: Not on file  Transportation Needs:   . Lack of Transportation (Medical): Not on file  . Lack of  Transportation (Non-Medical): Not on file  Physical Activity:   . Days of Exercise per Week: Not on file  . Minutes of Exercise per Session: Not on file  Stress:   . Feeling of Stress : Not on file  Social Connections:   . Frequency of Communication with Friends and Family: Not on file  . Frequency of Social Gatherings with Friends and Family: Not on file  . Attends Religious Services: Not on file  . Active Member of Clubs or Organizations: Not on file  . Attends CArchivistMeetings: Not on file  . Marital Status:  Not on file    Allergies:  Allergies  Allergen Reactions  . Aspirin   . Keflex [Cephalexin] Shortness Of Breath  . Azithromycin Hives and Nausea Only  . Barley Grass   . Clarithromycin Other (See Comments)  . Metoclopramide Other (See Comments)    TREMORS Tremors Tremors   . Oat Other (See Comments)  . Oatmeal Other (See Comments)  . Other     Other reaction(s): Other (See Comments) KEOPLEX.  Marland Kitchen Rye Grass Flower Pollen Extract [Gramineae Pollens]   . Vioxx [Rofecoxib] Diarrhea  . Wheat Bran Other (See Comments)    Celiac disease  . Wheat Extract   . Celebrex [Celecoxib] Hives  . Metoclopramide Hcl Anxiety    BODY TREMORS    Metabolic Disorder Labs: No results found for: HGBA1C, MPG No results found for: PROLACTIN Lab Results  Component Value Date   CHOL 188 03/23/2020   TRIG 109 03/23/2020   HDL 48 03/23/2020   CHOLHDL 3.9 03/23/2020   VLDL 22 03/23/2020   LDLCALC 118 (H) 03/23/2020   LDLCALC 112 (H) 03/03/2019   Lab Results  Component Value Date   TSH 0.063 (L) 03/23/2020   TSH 0.621 03/03/2019    Therapeutic Level Labs: No results found for: LITHIUM No results found for: VALPROATE No components found for:  CBMZ  Current Medications: Current Outpatient Medications  Medication Sig Dispense Refill  . acetaminophen (TYLENOL) 500 MG tablet Take by mouth.    Marland Kitchen albuterol (PROVENTIL HFA;VENTOLIN HFA) 108 (90 Base) MCG/ACT inhaler  Inhale 2 puffs into the lungs every 6 (six) hours as needed for wheezing or shortness of breath. 1 Inhaler 2  . albuterol (PROVENTIL) (2.5 MG/3ML) 0.083% nebulizer solution Take 3 mLs (2.5 mg total) by nebulization every 6 (six) hours as needed for wheezing or shortness of breath. 75 mL 6  . ALPRAZolam (XANAX) 0.5 MG tablet Take 1 tablet (0.5 mg total) by mouth 3 (three) times daily as needed for sleep or anxiety. 90 tablet 1  . ALPRAZolam (XANAX) 0.5 MG tablet Take 1 tablet (0.5 mg total) by mouth 3 (three) times daily as needed for anxiety. 90 tablet 1  . amitriptyline (ELAVIL) 25 MG tablet Take 1 tablet (25 mg total) by mouth at bedtime. 90 tablet 0  . Ascorbic Acid (VITAMIN C) 100 MG tablet Take 100 mg by mouth daily.    . calcium carbonate (OS-CAL) 1250 (500 Ca) MG chewable tablet Chew by mouth.    . carisoprodol (SOMA) 350 MG tablet Take 350 mg by mouth at bedtime. Patient states that she takes 1/2 tablet nightly    . CLIMARA 0.1 MG/24HR patch APPLY 1 PATCH TO SKIN ONCE WEEKLY 12 patch 4  . Dentifrices (BIOTENE DRY MOUTH DT) by Transmucosal route.    . doxycycline (VIBRAMYCIN) 50 MG capsule Take one cap po QD-BID. Take with food. 60 capsule 0  . doxycycline (VIBRAMYCIN) 50 MG capsule Take 1 capsule (50 mg total) by mouth daily. 60 capsule 0  . eletriptan (RELPAX) 40 MG tablet Take 40 mg by mouth as needed for migraine. may repeat in 2 hours if necessary     . EPINEPHRINE 0.3 mg/0.3 mL IJ SOAJ injection USE AS DIRECTED 1 Device 2  . ezetimibe (ZETIA) 10 MG tablet Take 1 tablet (10 mg total) by mouth daily. 90 tablet 1  . famotidine (PEPCID) 10 MG tablet Take 20 mg by mouth.    . fenofibrate (TRICOR) 145 MG tablet Take 1 tablet (145 mg total) by mouth  daily. 90 tablet 1  . fluticasone (FLONASE) 50 MCG/ACT nasal spray Place 2 sprays into both nostrils daily. 16 g 3  . fluticasone furoate-vilanterol (BREO ELLIPTA) 100-25 MCG/INH AEPB Inhale 1 puff into the lungs daily. 60 each 2  . gabapentin  (NEURONTIN) 600 MG tablet Take 1/2 (one-half) tablet by mouth twice daily 45 tablet 3  . HYDROcodone-acetaminophen (NORCO) 7.5-325 MG tablet Take 1-2 tablets by mouth every 6 (six) hours as needed for moderate pain. 30 tablet 0  . hydroxychloroquine (PLAQUENIL) 200 MG tablet Take 1 tablet (200 mg total) by mouth 2 (two) times daily. 60 tablet 0  . hyoscyamine (NULEV) 0.125 MG TBDP Place 0.125 mg under the tongue every 4 (four) hours as needed for bladder spasms.     . Lactobacillus (DIGESTIVE HEALTH PROBIOTIC PO) Take 1 tablet by mouth daily.    Marland Kitchen levothyroxine (SYNTHROID) 137 MCG tablet Take 1 tablet (137 mcg total) by mouth daily before breakfast. 30 tablet 3  . LINZESS 290 MCG CAPS capsule Take 290 mcg by mouth daily.    . Magnesium Oxide (NATRUL MAGNESIUM PO) Take by mouth.    . montelukast (SINGULAIR) 10 MG tablet TAKE ONE TABLET BY MOUTH ONCE DAILY FOR ASTHMA 90 tablet 3  . Multiple Vitamins-Minerals (HAIR SKIN AND NAILS FORMULA PO) Take by mouth.    . mupirocin ointment (BACTROBAN) 2 % as needed.   0  . pantoprazole (PROTONIX) 40 MG tablet Take 40 mg by mouth daily.    Vladimir Faster Glycol-Propyl Glycol 0.4-0.3 % SOLN Apply to eye.    Marland Kitchen PRESCRIPTION MEDICATION 1 Syringe by Subdermal route once a week. Allergy shot    . Probiotic Product ([place]) CAPS Take by mouth.    . sodium chloride (OCEAN) 0.65 % SOLN nasal spray Place 1 spray into both nostrils as needed for congestion.    . sodium chloride 0.9 % nebulizer solution     . SUMAtriptan-naproxen (TREXIMET) 85-500 MG tablet daily as needed.     . Tuberculin-Allergy Syringes 28G X 1/2" 1 ML MISC 1 each by Does not apply route every 14 (fourteen) days. For allergy injections 12 each 3  . Vaginal Lubricant (REPLENS) GEL Place vaginally.    . venlafaxine XR (EFFEXOR-XR) 150 MG 24 hr capsule Take 2 capsules (300 mg total) by mouth at bedtime. 180 capsule 0  . Vitamin D, Ergocalciferol, (DRISDOL) 1.25 MG (50000 UNIT) CAPS capsule  TAKE 1 CAPSULE (50,000 UNITS TOTAL) BY MOUTH EVERY 7 (SEVEN) DAYS. MONDAY 12 capsule 1  . vitamin E 1000 UNIT capsule Take by mouth.    Marland Kitchen White Petrolatum-Mineral Oil ([place] PETROL-MINERAL OIL-LANOLIN) 0.1-0.1 % OINT      No current facility-administered medications for this visit.     Musculoskeletal: Strength & Muscle Tone: N/A Gait & Station: N/A Patient leans: N/A  Psychiatric Specialty Exam: Review of Systems  Psychiatric/Behavioral: Negative for agitation, behavioral problems, confusion, decreased concentration, dysphoric mood, hallucinations, self-injury, sleep disturbance and suicidal ideas. The patient is not nervous/anxious and is not hyperactive.   All other systems reviewed and are negative.   There were no vitals taken for this visit.There is no height or weight on file to calculate BMI.  General Appearance: Fairly Groomed  Eye Contact:  Good  Speech:  Clear and Coherent  Volume:  Normal  Mood:  good  Affect:  Appropriate, Congruent and Full Range  Thought Process:  Coherent  Orientation:  Full (Time, Place, and Person)  Thought Content: Logical   Suicidal  Thoughts:  No  Homicidal Thoughts:  No  Memory:  Immediate;   Good  Judgement:  Good  Insight:  Fair  Psychomotor Activity:  Normal  Concentration:  Concentration: Good and Attention Span: Good  Recall:  Good  Fund of Knowledge: Good  Language: Good  Akathisia:  No  Handed:  Right  AIMS (if indicated): not done  Assets:  Communication Skills Desire for Improvement  ADL's:  Intact  Cognition: WNL  Sleep:  Good   Screenings: Mini-Mental     Clinical Support from 03/22/2020 in [facility], [place] from [facility]/2019 [facility], [facility]  Total Score (max 30 points ) 30 30    PHQ2-9     Clinical Support from 03/22/2020 in [facility], [facility] Office Visit from 10/14/2019 in [facility], [facility] Office Visit from 07/14/2019 in [facility], [place] from 03/22/2019 in [facility], [facility] Office Visit from 02/07/2019 in [facility], [facility]  PHQ-2 Total Score 0 0 0 0 0       Assessment and Plan:  DANICE DIPPOLITO is a 64 y.o. year old female with a history of anxiety,bilateral osteoarthritis, COPD, s/p thyroidectomy, hypothyroidism, history ofCeliac Disease, GERD, Sjogren's syndrome, who presents for follow up appointment for below.   1. MDD (major depressive disorder), recurrent, in full remission ([place]) 2. Generalized anxiety disorder She denies significant mood symptoms since the last visit.  Psychosocial stressors includes loss of her parents a few years ago, and her medical condition, which includes pain.  Will continue current dose of venlafaxine to target depression and anxiety.  We will continue amitriptyline for depression, anxiety and pain.  Will taper down Xanax to avoid potential risk. Discussed that this medication will be used only for short-term to avoid risk of fall, dependence.   # Memory loss She complains of short-term memory loss since she was admitted to psychiatry unit for depression in 1999. IADL independent. Will consider MOCA as indicated in the future.   Plan I have reviewed and updated plans as below 1. Continue venlafaxine 300 mg daily  2. Continueamitriptyline25 mg at night (anticholinergic side effect from higher dose) 3. Reduce Xanax 0.5 mgtwicea day as needed for anxiety 4.Return to clinic-  12/1 at 11 AM for 20 mins, video (she will contact the office if she needs to cancel the appointment due to her visitation in [place]) - oncarisoprodol(Soma), gabapentin 300 mg BID  Past trials of medication:sertraline, Paxil, venlafaxine, Xanax, Ritalin (worked very well)  The patient demonstrates the following risk factors for suicide: Chronic risk factors for suicide include:psychiatric disorder ofanxiety. Acute risk factorsfor suicide include: N/A. Protective  factorsfor this patient include: positive social support, responsibility to others (children, family), coping skills and hope for the future. Considering these factors, the overall suicide risk at this point appears to below.She owns a gun.Patientisappropriate for outpatient follow up.    [name], MD 06/25/2020, 12:06 PM

## 2020-06-20 NOTE — Progress Notes (Signed)
Subjective:    Patient ID: Susan [name], female    DOB: 1956/01/07, 64 y.o.   MRN: [id number] Chief Complaint  Patient presents with  . New Patient (Initial Visit)    ref Humphrey Rolls varicose veins w/inflammation    The patient is seen for evaluation of symptomatic varicose veins. The patient relates burning and stinging which worsened steadily throughout the course of the day, particularly with standing. The patient also notes an aching and throbbing pain over the varicosities, particularly with prolonged dependent positions. The symptoms are significantly improved with elevation.  The patient also notes that during hot weather the symptoms are greatly intensified. The patient states the pain from the varicose veins interferes with work, daily exercise, shopping and household maintenance. At this point, the symptoms are persistent and severe enough that they're having a negative impact on lifestyle and are interfering with daily activities.  The patient also notes that she has extensive pain when standing in 1 place or walking.  She notes that her legs began to throb worse during this time.  Both legs hurt and this is consistent.  She notes that the right is worse than the left.  This is been going on for approximately a year or so.  There is no history of DVT, PE or superficial thrombophlebitis. There is no history of ulceration or hemorrhage. The patient denies a significant family history of varicose veins.   The patient has graduated compression in the past however not on a consistent basis.  At the present time the patient has not been using over-the-counter analgesics. There is no history of prior surgical intervention or sclerotherapy.  Today noninvasive studies show no evidence of DVT or superficial venous stenosis bilaterally.  The bilateral lower extremities have no evidence of deep venous insufficiency.  Left lower extremity has no evidence of superficial venous reflux in either the  great or short saphenous vein.  The right lower extremity has evidence of venous reflux in the great saphenous vein at the proximal thigh extending to the mid thigh.  The vein diameters range from 0.35 cm to 0.38.   Review of Systems  Cardiovascular:       Claudication  Musculoskeletal: Positive for myalgias.  All other systems reviewed and are negative.      Objective:   Physical Exam Vitals reviewed.  HENT:     Head: Normocephalic.  Cardiovascular:     Rate and Rhythm: Normal rate.     Pulses:          Dorsalis pedis pulses are 1+ on the right side and 1+ on the left side.       Posterior tibial pulses are 1+ on the right side and 1+ on the left side.  Pulmonary:     Effort: Pulmonary effort is normal.  Musculoskeletal:        General: Tenderness present.     Right lower leg: No edema.     Left lower leg: No edema.  Skin:    Capillary Refill: Capillary refill takes less than 2 seconds.  Neurological:     Mental Status: She is alert and oriented to person, place, and time.     Motor: Tremor present.  Psychiatric:        Mood and Affect: Mood normal.        Behavior: Behavior normal.        Thought Content: Thought content normal.        Judgment: Judgment normal.  BP 123/74 (BP Location: Right Arm)   Pulse 78   Resp 16   Ht 5' 5.5" (1.664 m)   Wt 190 lb (86.2 kg)   BMI 31.14 kg/m   Past Medical History:  Diagnosis Date  . Arthritis   . Asthma   . Asthmatic bronchitis   . BRCA negative 05/2014   MyRisk neg  . Celiac disease   . Celiac sprue   . Chronic anxiety   . Complication of anesthesia    vomiting  . COPD (chronic obstructive pulmonary disease) ([place])   . Depression   . Environmental allergies   . Family history of breast cancer 05/2014   MyRisk neg; IBIS=29.5%  . Fibromyalgia    neuropathy all over  . Gastritis 10/08/2013  . GERD (gastroesophageal reflux disease)   . Hyperlipemia   . Hyperlipidemia    Intolerant to statins  .  Hypothyroidism (acquired)   . Increased risk of breast cancer 05/2014   IBIS=29.5%  . Increased risk of breast cancer 05/2014   IBIS=29.5%  . Migraine   . Migraines   . 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 Pancreatitis   . Sjogren's disease ([place])   . Spastic colon   . Thyroid cancer ([place]) 1990 and 1994   Total thyroidectomy with radioactive iodine.     Social History   Socioeconomic History  . Marital status: Married    Spouse name: Not on file  . Number of children: 0  . Years of education: Not on file  . Highest education level: Not on file  Occupational History  . Not on file  Tobacco Use  . Smoking status: Never Smoker  . Smokeless tobacco: Never Used  Vaping Use  . Vaping Use: Never used  Substance and Sexual Activity  . Alcohol use: No  . Drug use: No  . Sexual activity: Yes    Birth control/protection: Surgical  Other Topics Concern  . Not on file  Social History Narrative  . Not on file   Social Determinants of Health   Financial Resource Strain:   . Difficulty of Paying Living Expenses: Not on file  Food Insecurity:   . Worried About Charity fundraiser in the Last Year: Not on file  . Ran Out of Food in the Last Year: Not on file  Transportation Needs:   . Lack of Transportation (Medical): Not on file  . Lack of Transportation (Non-Medical): Not on file  Physical Activity:   . Days of Exercise per Week: Not on file  . Minutes of Exercise per Session: Not on file  Stress:   . Feeling of Stress : Not on file  Social Connections:   . Frequency of Communication with Friends and Family: Not on file  . Frequency of Social Gatherings with Friends and Family: Not on file  . Attends Religious Services: Not on file  . Active Member of Clubs or Organizations: Not on file  . Attends Archivist Meetings: Not on file  . Marital Status: Not on file  Intimate Partner Violence:   . Fear of Current or Ex-Partner: Not on file  . Emotionally Abused: Not  on file  . Physically Abused: Not on file  . Sexually Abused: Not on file    Past Surgical History:  Procedure Laterality Date  . ABDOMINAL HYSTERECTOMY  2006  . BLADDER REPAIR    . BREAST CYST ASPIRATION Left yrs ago  . CHOLECYSTECTOMY  1989  . COLONOSCOPY WITH ESOPHAGOGASTRODUODENOSCOPY (EGD)    .  COLONOSCOPY WITH PROPOFOL N/A 08/03/2017   Procedure: COLONOSCOPY WITH PROPOFOL;  Surgeon: Lollie Sails, MD;  Location[facility] ENDOSCOPY;  Service: Endoscopy;  Laterality: N/A;  . COLONOSCOPY WITH PROPOFOL N/A 10/18/2018   Procedure: COLONOSCOPY WITH PROPOFOL;  Surgeon: Lollie Sails, MD;  Location: [facility] ENDOSCOPY;  Service: Endoscopy;  Laterality: N/A;  . ESOPHAGOGASTRODUODENOSCOPY N/A 10/07/2013   Procedure: ESOPHAGOGASTRODUODENOSCOPY (EGD);  Surgeon: Danie Binder, MD;  Location: [facility] ENDO SUITE;  Service: Endoscopy;  Laterality: N/A;  patient received heparin at 530am given phenergan 65m IV 30 minutes before  . EYE SURGERY     lacrimal gland   . FLEXIBLE BRONCHOSCOPY N/A 06/04/2016   Procedure: FLEXIBLE BRONCHOSCOPY;  Surgeon: SAllyne Gee MD;  Location: [facility] ORS;  Service: Pulmonary;  Laterality: N/A;  . INCONTINENCE SURGERY  2004  . NASAL SINUS SURGERY    . THYROID LOBECTOMY    . TOTAL THYROIDECTOMY      Family History  Problem Relation Age of Onset  . Arthritis/Rheumatoid Mother        died in age 7135s . Breast cancer Mother 448 . Breast cancer Maternal Grandmother        40's  . Breast cancer Maternal Aunt        X 2. 50's  . Lung cancer Maternal Aunt   . Pancreatitis Neg Hx   . Colon cancer Neg Hx     Allergies  Allergen Reactions  . Aspirin   . Keflex [Cephalexin] Shortness Of Breath  . Azithromycin Hives and Nausea Only  . Barley Grass   . Clarithromycin Other (See Comments)  . Metoclopramide Other (See Comments)    TREMORS Tremors Tremors   . Oat Other (See Comments)  . Oatmeal Other (See Comments)  . Other     Other reaction(s): Other (See  Comments) KEOPLEX.  .Marland KitchenRye Grass Flower Pollen Extract [Gramineae Pollens]   . Vioxx [Rofecoxib] Diarrhea  . Wheat Bran Other (See Comments)    Celiac disease  . Wheat Extract   . Celebrex [Celecoxib] Hives  . Metoclopramide Hcl Anxiety    BODY TREMORS       Assessment & Plan:   1. Varicose veins of bilateral lower extremities with pain  Recommend:  The patient has large symptomatic varicose veins that are painful and associated with swelling.  I have had a long discussion with the patient regarding  varicose veins and why they cause symptoms.  Patient will begin wearing graduated compression stockings class 1 on a daily basis, beginning first thing in the morning and removing them in the evening. The patient is instructed specifically not to sleep in the stockings.    The patient  will also begin using over-the-counter analgesics such as Motrin 600 mg po TID to help control the symptoms.    In addition, behavioral modification including elevation during the day will be initiated.    Pending the results of these changes the  patient will be reevaluated in three months.    Further plans will be based on the ultrasound results and whether conservative therapies are successful at eliminating the pain and swelling.   2. Pain in both lower extremities Based upon the patient's description of pain, it does not sound entirely like it is related to her varicose veins.  Her description of pain is suspicious for claudication however this could be related to arterial causes or neurogenic.  When the patient follows up in 3 months we will also have ABIs done in  order to rule out any arterial causes to her lower extremity pain and discomfort.   Current Outpatient Medications on File Prior to Visit  Medication Sig Dispense Refill  . acetaminophen (TYLENOL) 500 MG tablet Take by mouth.    Marland Kitchen albuterol (PROVENTIL HFA;VENTOLIN HFA) 108 (90 Base) MCG/ACT inhaler Inhale 2 puffs into the lungs every 6  (six) hours as needed for wheezing or shortness of breath. 1 Inhaler 2  . albuterol (PROVENTIL) (2.5 MG/3ML) 0.083% nebulizer solution Take 3 mLs (2.5 mg total) by nebulization every 6 (six) hours as needed for wheezing or shortness of breath. 75 mL 6  . ALPRAZolam (XANAX) 0.5 MG tablet Take 1 tablet (0.5 mg total) by mouth 3 (three) times daily as needed for sleep or anxiety. 90 tablet 1  . amitriptyline (ELAVIL) 25 MG tablet Take 1 tablet (25 mg total) by mouth at bedtime. 90 tablet 0  . Ascorbic Acid (VITAMIN C) 100 MG tablet Take 100 mg by mouth daily.    . calcium carbonate (OS-CAL) 1250 (500 Ca) MG chewable tablet Chew by mouth.    . carisoprodol (SOMA) 350 MG tablet Take 350 mg by mouth at bedtime. Patient states that she takes 1/2 tablet nightly    . CLIMARA 0.1 MG/24HR patch APPLY 1 PATCH TO SKIN ONCE WEEKLY 12 patch 4  . Dentifrices (BIOTENE DRY MOUTH DT) by Transmucosal route.    . doxycycline (VIBRAMYCIN) 50 MG capsule Take one cap po QD-BID. Take with food. 60 capsule 0  . eletriptan (RELPAX) 40 MG tablet Take 40 mg by mouth as needed for migraine. may repeat in 2 hours if necessary     . EPINEPHRINE 0.3 mg/0.3 mL IJ SOAJ injection USE AS DIRECTED 1 Device 2  . ezetimibe (ZETIA) 10 MG tablet Take 1 tablet (10 mg total) by mouth daily. 90 tablet 1  . famotidine (PEPCID) 10 MG tablet Take 20 mg by mouth.    . fenofibrate (TRICOR) 145 MG tablet Take 1 tablet (145 mg total) by mouth daily. 90 tablet 1  . fluticasone (FLONASE) 50 MCG/ACT nasal spray Place 2 sprays into both nostrils daily. 16 g 3  . fluticasone furoate-vilanterol (BREO ELLIPTA) 100-25 MCG/INH AEPB Inhale 1 puff into the lungs daily. 60 each 2  . gabapentin (NEURONTIN) 600 MG tablet Take 1/2 (one-half) tablet by mouth twice daily 45 tablet 3  . HYDROcodone-acetaminophen (NORCO) 7.5-325 MG tablet Take 1-2 tablets by mouth every 6 (six) hours as needed for moderate pain. 30 tablet 0  . hydroxychloroquine (PLAQUENIL) 200 MG  tablet Take by mouth 2 (two) times daily.    . hyoscyamine (NULEV) 0.125 MG TBDP Place 0.125 mg under the tongue every 4 (four) hours as needed for bladder spasms.     . Lactobacillus (DIGESTIVE HEALTH PROBIOTIC PO) Take 1 tablet by mouth daily.    Marland Kitchen levothyroxine (SYNTHROID) 137 MCG tablet Take 1 tablet (137 mcg total) by mouth daily before breakfast. 30 tablet 3  . LINZESS 290 MCG CAPS capsule Take 290 mcg by mouth daily.    . Magnesium Oxide (NATRUL MAGNESIUM PO) Take by mouth.    . montelukast (SINGULAIR) 10 MG tablet TAKE ONE TABLET BY MOUTH ONCE DAILY FOR ASTHMA 90 tablet 3  . mupirocin ointment (BACTROBAN) 2 % as needed.   0  . pantoprazole (PROTONIX) 40 MG tablet Take 40 mg by mouth daily.    Vladimir Faster Glycol-Propyl Glycol 0.4-0.3 % SOLN Apply to eye.    Marland Kitchen PRESCRIPTION MEDICATION 1 Syringe by  Subdermal route once a week. Allergy shot    . Probiotic Product ([place]) CAPS Take by mouth.    . sodium chloride (OCEAN) 0.65 % SOLN nasal spray Place 1 spray into both nostrils as needed for congestion.    . sodium chloride 0.9 % nebulizer solution     . SUMAtriptan-naproxen (TREXIMET) 85-500 MG tablet daily as needed.     . Tuberculin-Allergy Syringes 28G X 1/2" 1 ML MISC 1 each by Does not apply route every 14 (fourteen) days. For allergy injections 12 each 3  . Vaginal Lubricant (REPLENS) GEL Place vaginally.    . venlafaxine XR (EFFEXOR-XR) 150 MG 24 hr capsule Take 2 capsules (300 mg total) by mouth at bedtime. 180 capsule 0  . Vitamin D, Ergocalciferol, (DRISDOL) 1.25 MG (50000 UNIT) CAPS capsule TAKE 1 CAPSULE (50,000 UNITS TOTAL) BY MOUTH EVERY 7 (SEVEN) DAYS. MONDAY 12 capsule 1  . vitamin E 1000 UNIT capsule Take by mouth.    Marland Kitchen White Petrolatum-Mineral Oil ([place] PETROL-MINERAL OIL-LANOLIN) 0.1-0.1 % OINT     . ALPRAZolam (XANAX) 0.5 MG tablet Take 1 tablet (0.5 mg total) by mouth 3 (three) times daily as needed for anxiety. 90 tablet 1  . doxycycline (VIBRAMYCIN) 50 MG  capsule Take 1 capsule (50 mg total) by mouth daily. 60 capsule 0  . Multiple Vitamins-Minerals (HAIR SKIN AND NAILS FORMULA PO) Take by mouth.     No current facility-administered medications on file prior to visit.    There are no Patient Instructions on file for this visit. No follow-ups on file.   Kris Hartmann, NP

## 2020-06-21 ENCOUNTER — Other Ambulatory Visit: Payer: Self-pay | Admitting: Rheumatology

## 2020-06-21 MED ORDER — HYDROXYCHLOROQUINE SULFATE 200 MG PO TABS: 200.0000 mg | ORAL_TABLET | Freq: Two times a day (BID) | ORAL | 0 refills | Status: DC

## 2020-06-21 NOTE — Telephone Encounter (Signed)
Last Visit: 02/29/2020 Next Visit: 10/03/2020 Labs: 03/23/2020 Labs stable.  Eye exam: no baseline PLQ eye exam on file  Current Dose per office note 02/29/2020: Plaquenil 200 mg p.o. twice daily DX: Sjogren's syndrome with other organ involvement   Patient states she has had this completed and will have eye doctor send results.   Okay to refill Plaquenil?

## 2020-06-21 NOTE — Telephone Encounter (Signed)
Patient called requesting prescription refill of Hydroxychloroquine to be sent to [facility] at [address].  Patient is requesting a 3 month supply.

## 2020-06-22 ENCOUNTER — Other Ambulatory Visit: Payer: Self-pay | Admitting: Obstetrics & Gynecology

## 2020-06-23 DIAGNOSIS — G4733 Obstructive sleep apnea (adult) (pediatric): Secondary | ICD-10-CM | POA: Diagnosis not present

## 2020-06-25 ENCOUNTER — Encounter (HOSPITAL_COMMUNITY): Payer: Self-pay | Admitting: Psychiatry

## 2020-06-25 ENCOUNTER — Other Ambulatory Visit: Payer: Self-pay

## 2020-06-25 ENCOUNTER — Telehealth (INDEPENDENT_AMBULATORY_CARE_PROVIDER_SITE_OTHER): Payer: Medicare HMO | Admitting: Psychiatry

## 2020-06-25 DIAGNOSIS — F411 Generalized anxiety disorder: Secondary | ICD-10-CM

## 2020-06-25 DIAGNOSIS — R69 Illness, unspecified: Secondary | ICD-10-CM | POA: Diagnosis not present

## 2020-06-25 DIAGNOSIS — F3342 Major depressive disorder, recurrent, in full remission: Secondary | ICD-10-CM | POA: Diagnosis not present

## 2020-06-25 MED ORDER — VENLAFAXINE HCL ER 150 MG PO CP24: 300.0000 mg | ORAL_CAPSULE | Freq: Every day | ORAL | 0 refills | Status: DC

## 2020-06-25 MED ORDER — AMITRIPTYLINE HCL 25 MG PO TABS: 25.0000 mg | ORAL_TABLET | Freq: Every day | ORAL | 0 refills | Status: DC

## 2020-06-25 MED ORDER — LEVOTHYROXINE SODIUM 137 MCG PO TABS: 137.0000 ug | ORAL_TABLET | Freq: Every day | ORAL | 3 refills | Status: DC

## 2020-06-25 MED ORDER — ALPRAZOLAM 0.5 MG PO TABS: 0.5000 mg | ORAL_TABLET | Freq: Two times a day (BID) | ORAL | 2 refills | Status: DC | PRN

## 2020-06-25 NOTE — Patient Instructions (Signed)
1. Continue venlafaxine 300 mg daily  2. Continueamitriptyline25 mg at night  3. Reduce Xanax 0.5 mgtwicea day as needed for anxiety 4.Return to clinic-  12/1 at 11 AM

## 2020-06-26 ENCOUNTER — Telehealth (HOSPITAL_COMMUNITY): Payer: Self-pay | Admitting: *Deleted

## 2020-06-26 ENCOUNTER — Telehealth: Payer: Self-pay

## 2020-06-26 NOTE — Telephone Encounter (Signed)
Patient called stating she did not get her Xanax and her pharmacy is telling her that they don't have it. Staff called patient pharmacy and spoke with Vaughan Basta the pharmacist. Per Vaughan Basta they received all the scripts that were sent via [facility] but did not receive the Xanax.Spoke with patient she would like to have provider please resend her [facility]anax script [facility] to [facility] because she is out of her script.

## 2020-06-26 NOTE — Telephone Encounter (Signed)
Confirmed and screened for 06-27-20 ov.

## 2020-06-27 ENCOUNTER — Other Ambulatory Visit: Payer: Self-pay

## 2020-06-27 ENCOUNTER — Ambulatory Visit (INDEPENDENT_AMBULATORY_CARE_PROVIDER_SITE_OTHER): Payer: Medicare HMO

## 2020-06-27 ENCOUNTER — Other Ambulatory Visit (HOSPITAL_COMMUNITY): Payer: Self-pay | Admitting: Psychiatry

## 2020-06-27 DIAGNOSIS — G4733 Obstructive sleep apnea (adult) (pediatric): Secondary | ICD-10-CM | POA: Diagnosis not present

## 2020-06-27 DIAGNOSIS — F411 Generalized anxiety disorder: Secondary | ICD-10-CM

## 2020-06-27 DIAGNOSIS — Z23 Encounter for immunization: Secondary | ICD-10-CM | POA: Diagnosis not present

## 2020-06-27 MED ORDER — ALPRAZOLAM 0.5 MG PO TABS: 0.5000 mg | ORAL_TABLET | Freq: Two times a day (BID) | ORAL | 2 refills | Status: DC | PRN

## 2020-06-27 NOTE — Telephone Encounter (Signed)
Medication re-ordered. Please advise her to contact the office if she is still not able to fill this medication.

## 2020-06-27 NOTE — Telephone Encounter (Signed)
Patient aware and verbalized understanding. °

## 2020-06-27 NOTE — Telephone Encounter (Signed)
Discussed with the pharmacy. Although Xanax was ordered a few days ago, they have not received that order. Ordered it again.   I have utilized the [place] Controlled Substances Reporting System (PMP AWARxE) to confirm adherence regarding the patient's medication. My review reveals appropriate prescription fills.

## 2020-06-27 NOTE — Progress Notes (Signed)
95 percentile pressure 9   95th percentile leak 25.4   apnea index 1.9 /hr  apnea-hypopnea index  2.0 /hr   total days used  >4 hr 29 days  total days used <4 hr 1 days  Total compliance 97 percent  She is doing great no problems or questions at this time

## 2020-07-03 DIAGNOSIS — G4700 Insomnia, unspecified: Secondary | ICD-10-CM | POA: Diagnosis not present

## 2020-07-03 DIAGNOSIS — G250 Essential tremor: Secondary | ICD-10-CM | POA: Diagnosis not present

## 2020-07-03 DIAGNOSIS — G43019 Migraine without aura, intractable, without status migrainosus: Secondary | ICD-10-CM | POA: Diagnosis not present

## 2020-07-03 DIAGNOSIS — M797 Fibromyalgia: Secondary | ICD-10-CM | POA: Diagnosis not present

## 2020-07-19 ENCOUNTER — Other Ambulatory Visit: Payer: Self-pay | Admitting: Rheumatology

## 2020-07-19 NOTE — Telephone Encounter (Signed)
I do not see eye exam. Last office visit was in 04/21

## 2020-07-19 NOTE — Telephone Encounter (Signed)
Spoke with patient and advised her that we need updated plaquenil eye exam. Patient will call and schedule with ophthalmologist.

## 2020-07-24 DIAGNOSIS — G4733 Obstructive sleep apnea (adult) (pediatric): Secondary | ICD-10-CM | POA: Diagnosis not present

## 2020-07-26 ENCOUNTER — Telehealth (HOSPITAL_COMMUNITY): Payer: Medicare HMO | Admitting: Psychiatry

## 2020-07-26 DIAGNOSIS — J301 Allergic rhinitis due to pollen: Secondary | ICD-10-CM | POA: Diagnosis not present

## 2020-08-20 ENCOUNTER — Other Ambulatory Visit: Payer: Self-pay | Admitting: Rheumatology

## 2020-08-20 NOTE — Telephone Encounter (Addendum)
Last Visit: 02/29/2020  Next Visit: 10/03/2020 Labs: 03/23/2020 stable Eye exam: no baseline PLQ eye exam on file  Current Dose per office note 02/29/2020: Plaquenil 200 mg p.o. twice daily DX: Sjogren's syndrome with other organ involvement   Left message to advise patient she is due to update labs and we need her updated PLQ eye exam.  Okay to refill PLQ?

## 2020-08-20 NOTE — Telephone Encounter (Signed)
Left message to advise patient she is due to update labs and we need her updated PLQ eye exam.

## 2020-08-20 NOTE — Telephone Encounter (Signed)
Please advise the patient to update lab work and PLQ eye exam ASAP.  Ok to refill only 30 day supply of PLQ until she has updated PLQ eye exam.

## 2020-08-21 ENCOUNTER — Telehealth: Payer: Self-pay

## 2020-08-21 DIAGNOSIS — Z79899 Other long term (current) drug therapy: Secondary | ICD-10-CM

## 2020-08-21 NOTE — Telephone Encounter (Signed)
Patient called stating she spoke with you yesterday regarding her labwork orders.  Patient states she went to [facility] this afternoon and was told they haven't received the orders.  Patient is requesting the orders be faxed to [facility] at [phone].  Patient states she can go back tomorrow 08/22/20.

## 2020-08-21 NOTE — Telephone Encounter (Signed)
Lab Orders released and faxed.

## 2020-08-22 DIAGNOSIS — Z79899 Other long term (current) drug therapy: Secondary | ICD-10-CM | POA: Diagnosis not present

## 2020-08-23 DIAGNOSIS — G4733 Obstructive sleep apnea (adult) (pediatric): Secondary | ICD-10-CM | POA: Diagnosis not present

## 2020-08-23 LAB — CBC WITH DIFFERENTIAL/PLATELET
Basophils Absolute: 0.1 10*3/uL (ref 0.0–0.2)
Basos: 1 %
EOS (ABSOLUTE): 0.3 10*3/uL (ref 0.0–0.4)
Eos: 5 %
Hematocrit: 36.9 % (ref 34.0–46.6)
Hemoglobin: 12.6 g/dL (ref 11.1–15.9)
Immature Grans (Abs): 0 10*3/uL (ref 0.0–0.1)
Immature Granulocytes: 0 %
Lymphocytes Absolute: 1.8 10*3/uL (ref 0.7–3.1)
Lymphs: 34 %
MCH: 29.6 pg (ref 26.6–33.0)
MCHC: 34.1 g/dL (ref 31.5–35.7)
MCV: 87 fL (ref 79–97)
Monocytes Absolute: 0.5 10*3/uL (ref 0.1–0.9)
Monocytes: 10 %
Neutrophils Absolute: 2.6 10*3/uL (ref 1.4–7.0)
Neutrophils: 50 %
Platelets: 363 10*3/uL (ref 150–450)
RBC: 4.25 x10E6/uL (ref 3.77–5.28)
RDW: 12.9 % (ref 11.7–15.4)
WBC: 5.2 10*3/uL (ref 3.4–10.8)

## 2020-08-23 LAB — CMP14+EGFR
ALT: 24 IU/L (ref 0–32)
AST: 19 IU/L (ref 0–40)
Albumin/Globulin Ratio: 1.6 (ref 1.2–2.2)
Albumin: 4.4 g/dL (ref 3.8–4.8)
Alkaline Phosphatase: 79 IU/L (ref 44–121)
BUN/Creatinine Ratio: 14 (ref 12–28)
BUN: 12 mg/dL (ref 8–27)
Bilirubin Total: <0.2 mg/dL (ref 0.0–1.2)
CO2: 21 mmol/L (ref 20–29)
Calcium: 9.7 mg/dL (ref 8.7–10.3)
Chloride: 106 mmol/L (ref 96–106)
Creatinine, Ser: 0.87 mg/dL (ref 0.57–1.00)
GFR calc Af Amer: 81 mL/min/{1.73_m2} (ref >59)
GFR calc non Af Amer: 70 mL/min/{1.73_m2} (ref >59)
Globulin, Total: 2.8 g/dL (ref 1.5–4.5)
Glucose: 94 mg/dL (ref 65–99)
Potassium: 4.7 mmol/L (ref 3.5–5.2)
Sodium: 140 mmol/L (ref 134–144)
Total Protein: 7.2 g/dL (ref 6.0–8.5)

## 2020-08-23 NOTE — Telephone Encounter (Signed)
CBC and CMP normal.

## 2020-08-27 DIAGNOSIS — R69 Illness, unspecified: Secondary | ICD-10-CM | POA: Diagnosis not present

## 2020-08-28 ENCOUNTER — Other Ambulatory Visit: Payer: Self-pay

## 2020-08-28 DIAGNOSIS — J3089 Other allergic rhinitis: Secondary | ICD-10-CM

## 2020-08-28 MED ORDER — MONTELUKAST SODIUM 10 MG PO TABS: ORAL_TABLET | ORAL | 3 refills | Status: DC

## 2020-09-03 DIAGNOSIS — G4733 Obstructive sleep apnea (adult) (pediatric): Secondary | ICD-10-CM | POA: Diagnosis not present

## 2020-09-11 ENCOUNTER — Other Ambulatory Visit: Payer: Self-pay

## 2020-09-11 DIAGNOSIS — E785 Hyperlipidemia, unspecified: Secondary | ICD-10-CM

## 2020-09-11 MED ORDER — FENOFIBRATE 145 MG PO TABS: 145.0000 mg | ORAL_TABLET | Freq: Every day | ORAL | 1 refills | Status: DC

## 2020-09-11 MED ORDER — EZETIMIBE 10 MG PO TABS: 10.0000 mg | ORAL_TABLET | Freq: Every day | ORAL | 1 refills | Status: DC

## 2020-09-13 NOTE — Progress Notes (Signed)
Virtual Visit via Video Note  I connected with Susan [name] on 09/26/20 at 11:00 AM EST by a video enabled telemedicine application and verified that I am speaking with the correct person using two identifiers.  Location: Patient: home Provider: office Persons participated in the visit- patient, provider   I discussed the limitations of evaluation and management by telemedicine and the availability of in person appointments. The patient expressed understanding and agreed to proceed.    I discussed the assessment and treatment plan with the patient. The patient was provided an opportunity to ask questions and all were answered. The patient agreed with the plan and demonstrated an understanding of the instructions.   The patient was advised to call back or seek an in-person evaluation if the symptoms worsen or if the condition fails to improve as anticipated.  I provided 15 minutes of non-face-to-face time during this encounter.   [name], MD    [facility] MD/PA/NP OP Progress Note  09/26/2020 11:23 AM Susan [name]  Chief Complaint:  Chief Complaint    Follow-up; Depression; Anxiety     HPI:  This is a follow-up appointment for depression and anxiety.  She states that she lost her nephew in June, and her sister-in-law a few weeks ago. (She likely had PE).  She states that her sister-in-law was almost like a real sister to her.  She states that he will be hard on christmas.  She also states that Xanax is now prescribed by her neurologist.  She has had worsening in tremors when she takes lower dose of Xanax.  She believes the current combination of medication works well for her, and prefers to stay the way as it is. She is planning to leave to [place] in Dec, and back in April.  She has occasional insomnia. She feels down at times, referring to this loss. She has fair energy and motivation.  She has fair concentration.  She denies any change in appetite.  She denies  SI.  She feels anxious and tense at times.     Visit Diagnosis:    ICD-10-CM   1. MDD (major depressive disorder), recurrent, in partial remission (HCC)  F33.41 venlafaxine XR (EFFEXOR-XR) 150 MG 24 hr capsule    amitriptyline (ELAVIL) 25 MG tablet  2. Generalized anxiety disorder  F41.1 venlafaxine XR (EFFEXOR-XR) 150 MG 24 hr capsule    amitriptyline (ELAVIL) 25 MG tablet    Past Psychiatric History: Please see initial evaluation for full details. I have reviewed the history. No updates at this time.     Past Medical History:  Past Medical History:  Diagnosis Date  . Arthritis   . Asthma   . Asthmatic bronchitis   . BRCA negative 05/2014   MyRisk neg  . Celiac disease   . Celiac sprue   . Chronic anxiety   . Complication of anesthesia    vomiting  . COPD (chronic obstructive pulmonary disease) ([place])   . Depression   . Environmental allergies   . Family history of breast cancer 05/2014   MyRisk neg; IBIS=29.5%  . Fibromyalgia    neuropathy all over  . Gastritis 10/08/2013  . GERD (gastroesophageal reflux disease)   . Hyperlipemia   . Hyperlipidemia    Intolerant to statins  . Hypothyroidism (acquired)   . Increased risk of breast cancer 05/2014   IBIS=29.5%  . Increased risk of breast cancer 05/2014   IBIS=29.5%  . Migraine   . Migraines   .  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 Pancreatitis   . Sjogren's disease ([place])   . Spastic colon   . Thyroid cancer ([place]) 1990 and 1994   Total thyroidectomy with radioactive iodine.     Past Surgical History:  Procedure Laterality Date  . ABDOMINAL HYSTERECTOMY  2006  . BLADDER REPAIR    . BREAST CYST ASPIRATION Left yrs ago  . CHOLECYSTECTOMY  1989  . COLONOSCOPY WITH ESOPHAGOGASTRODUODENOSCOPY (EGD)    . COLONOSCOPY WITH PROPOFOL N/A 08/03/2017   Procedure: COLONOSCOPY WITH PROPOFOL;  Surgeon: Lollie Sails, MD;  Location: [facility] ENDOSCOPY;  Service: Endoscopy;  Laterality: N/A;  . COLONOSCOPY WITH PROPOFOL N/A  10/18/2018   Procedure: COLONOSCOPY WITH PROPOFOL;  Surgeon: Lollie Sails, MD;  Location: [facility] ENDOSCOPY;  Service: Endoscopy;  Laterality: N/A;  . ESOPHAGOGASTRODUODENOSCOPY N/A 10/07/2013   Procedure: ESOPHAGOGASTRODUODENOSCOPY (EGD);  Surgeon: Danie Binder, MD;  Location: [facility] ENDO SUITE;  Service: Endoscopy;  Laterality: N/A;  patient received heparin at 530am given phenergan 28m IV 30 minutes before  . EYE SURGERY     lacrimal gland   . FLEXIBLE BRONCHOSCOPY N/A 06/04/2016   Procedure: FLEXIBLE BRONCHOSCOPY;  Surgeon: SAllyne Gee MD;  Location: [facility] ORS;  Service: Pulmonary;  Laterality: N/A;  . INCONTINENCE SURGERY  2004  . NASAL SINUS SURGERY    . THYROID LOBECTOMY    . TOTAL THYROIDECTOMY      Family Psychiatric History: Please see initial evaluation for full details. I have reviewed the history. No updates at this time.     Family History:  Family History  Problem Relation Age of Onset  . Arthritis/Rheumatoid Mother        died in age 3873s . Breast cancer Mother 484 . Breast cancer Maternal Grandmother        40's  . Breast cancer Maternal Aunt        X 2. 50's  . Lung cancer Maternal Aunt   . Pancreatitis Neg Hx   . Colon cancer Neg Hx     Social History:  Social History   Socioeconomic History  . Marital status: Married    Spouse name: Not on file  . Number of children: 0  . Years of education: Not on file  . Highest education level: Not on file  Occupational History  . Not on file  Tobacco Use  . Smoking status: Never Smoker  . Smokeless tobacco: Never Used  Vaping Use  . Vaping Use: Never used  Substance and Sexual Activity  . Alcohol use: No  . Drug use: No  . Sexual activity: Yes    Birth control/protection: Surgical  Other Topics Concern  . Not on file  Social History Narrative  . Not on file   Social Determinants of Health   Financial Resource Strain:   . Difficulty of Paying Living Expenses: Not on file  Food Insecurity:   .  Worried About RCharity fundraiserin the Last Year: Not on file  . Ran Out of Food in the Last Year: Not on file  Transportation Needs:   . Lack of Transportation (Medical): Not on file  . Lack of Transportation (Non-Medical): Not on file  Physical Activity:   . Days of Exercise per Week: Not on file  . Minutes of Exercise per Session: Not on file  Stress:   . Feeling of Stress : Not on file  Social Connections:   . Frequency of Communication with Friends and Family: Not on file  .  Frequency of Social Gatherings with Friends and Family: Not on file  . Attends Religious Services: Not on file  . Active Member of Clubs or Organizations: Not on file  . Attends Archivist Meetings: Not on file  . Marital Status: Not on file    Allergies:  Allergies  Allergen Reactions  . Aspirin   . Keflex [Cephalexin] Shortness Of Breath  . Azithromycin Hives and Nausea Only  . Barley Grass   . Clarithromycin Other (See Comments)  . Metoclopramide Other (See Comments)    TREMORS Tremors Tremors   . Oat Other (See Comments)  . Oatmeal Other (See Comments)  . Other     Other reaction(s): Other (See Comments) KEOPLEX.  Marland Kitchen Rye Grass Flower Pollen Extract [Gramineae Pollens]   . Vioxx [Rofecoxib] Diarrhea  . Wheat Bran Other (See Comments)    Celiac disease  . Wheat Extract   . Celebrex [Celecoxib] Hives  . Metoclopramide Hcl Anxiety    BODY TREMORS    Metabolic Disorder Labs: No results found for: HGBA1C, MPG No results found for: PROLACTIN Lab Results  Component Value Date   CHOL 188 03/23/2020   TRIG 109 03/23/2020   HDL 48 03/23/2020   CHOLHDL 3.9 03/23/2020   VLDL 22 03/23/2020   LDLCALC 118 (H) 03/23/2020   LDLCALC 112 (H) 03/03/2019   Lab Results  Component Value Date   TSH 0.063 (L) 03/23/2020   TSH 0.621 03/03/2019    Therapeutic Level Labs: No results found for: LITHIUM No results found for: VALPROATE No components found for:  CBMZ  Current  Medications: Current Outpatient Medications  Medication Sig Dispense Refill  . acetaminophen (TYLENOL) 500 MG tablet Take by mouth.    Marland Kitchen albuterol (PROVENTIL HFA;VENTOLIN HFA) 108 (90 Base) MCG/ACT inhaler Inhale 2 puffs into the lungs every 6 (six) hours as needed for wheezing or shortness of breath. 1 Inhaler 2  . albuterol (PROVENTIL) (2.5 MG/3ML) 0.083% nebulizer solution Take 3 mLs (2.5 mg total) by nebulization every 6 (six) hours as needed for wheezing or shortness of breath. 75 mL 6  . ALPRAZolam (XANAX) 0.5 MG tablet Take 1 tablet (0.5 mg total) by mouth 2 (two) times daily as needed for anxiety or sleep. 60 tablet 2  . amitriptyline (ELAVIL) 25 MG tablet Take 1 tablet (25 mg total) by mouth at bedtime. 90 tablet 1  . Ascorbic Acid (VITAMIN C) 100 MG tablet Take 100 mg by mouth daily.    . calcium carbonate (OS-CAL) 1250 (500 Ca) MG chewable tablet Chew by mouth.    . carisoprodol (SOMA) 350 MG tablet Take 350 mg by mouth at bedtime. Patient states that she takes 1/2 tablet nightly    . CLIMARA 0.1 MG/24HR patch APPLY 1 PATCH TO SKIN ONCE WEEKLY 12 patch 4  . Dentifrices (BIOTENE DRY MOUTH DT) by Transmucosal route.    . doxycycline (VIBRAMYCIN) 50 MG capsule Take one cap po QD-BID. Take with food. 60 capsule 0  . doxycycline (VIBRAMYCIN) 50 MG capsule Take 1 capsule (50 mg total) by mouth daily. 60 capsule 0  . eletriptan (RELPAX) 40 MG tablet Take 40 mg by mouth as needed for migraine. may repeat in 2 hours if necessary     . EPINEPHRINE 0.3 mg/0.3 mL IJ SOAJ injection USE AS DIRECTED 1 Device 2  . ezetimibe (ZETIA) 10 MG tablet Take 1 tablet (10 mg total) by mouth daily. 90 tablet 1  . famotidine (PEPCID) 10 MG tablet Take 20 mg by  mouth.    . fenofibrate (TRICOR) 145 MG tablet Take 1 tablet (145 mg total) by mouth daily. 90 tablet 1  . fluticasone (FLONASE) 50 MCG/ACT nasal spray Place 2 sprays into both nostrils daily. 16 g 3  . fluticasone furoate-vilanterol (BREO ELLIPTA) 100-25  MCG/INH AEPB Inhale 1 puff into the lungs daily. 60 each 2  . gabapentin (NEURONTIN) 600 MG tablet Take 1/2 (one-half) tablet by mouth twice daily 45 tablet 3  . HYDROcodone-acetaminophen (NORCO) 7.5-325 MG tablet Take 1-2 tablets by mouth every 6 (six) hours as needed for moderate pain. 30 tablet 0  . hydroxychloroquine (PLAQUENIL) 200 MG tablet TAKE 1 TABLET BY MOUTH TWICE A DAY 60 tablet 0  . hyoscyamine (NULEV) 0.125 MG TBDP Place 0.125 mg under the tongue every 4 (four) hours as needed for bladder spasms.     . Lactobacillus (DIGESTIVE HEALTH PROBIOTIC PO) Take 1 tablet by mouth daily.    Marland Kitchen levothyroxine (SYNTHROID) 137 MCG tablet Take 1 tablet (137 mcg total) by mouth daily before breakfast. 30 tablet 3  . LINZESS 290 MCG CAPS capsule Take 290 mcg by mouth daily.    . Magnesium Oxide (NATRUL MAGNESIUM PO) Take by mouth.    . montelukast (SINGULAIR) 10 MG tablet TAKE ONE TABLET BY MOUTH ONCE DAILY FOR ASTHMA 90 tablet 3  . Multiple Vitamins-Minerals (HAIR SKIN AND NAILS FORMULA PO) Take by mouth.    . mupirocin ointment (BACTROBAN) 2 % as needed.   0  . pantoprazole (PROTONIX) 40 MG tablet Take 40 mg by mouth daily.    Vladimir Faster Glycol-Propyl Glycol 0.4-0.3 % SOLN Apply to eye.    Marland Kitchen PRESCRIPTION MEDICATION 1 Syringe by Subdermal route once a week. Allergy shot    . Probiotic Product ([place]) CAPS Take by mouth.    . sodium chloride (OCEAN) 0.65 % SOLN nasal spray Place 1 spray into both nostrils as needed for congestion.    . sodium chloride 0.9 % nebulizer solution     . SUMAtriptan-naproxen (TREXIMET) 85-500 MG tablet daily as needed.     . Tuberculin-Allergy Syringes 28G X 1/2" 1 ML MISC 1 each by Does not apply route every 14 (fourteen) days. For allergy injections 12 each 3  . Vaginal Lubricant (REPLENS) GEL Place vaginally.    Derrill Memo ON 11/21/2020] venlafaxine XR (EFFEXOR-XR) 150 MG 24 hr capsule Take 2 capsules (300 mg total) by mouth at bedtime. 180 capsule 1  .  Vitamin D, Ergocalciferol, (DRISDOL) 1.25 MG (50000 UNIT) CAPS capsule TAKE 1 CAPSULE (50,000 UNITS TOTAL) BY MOUTH EVERY 7 (SEVEN) DAYS. MONDAY 12 capsule 1  . vitamin E 1000 UNIT capsule Take by mouth.    Marland Kitchen White Petrolatum-Mineral Oil ([place] PETROL-MINERAL OIL-LANOLIN) 0.1-0.1 % OINT      No current facility-administered medications for this visit.     Musculoskeletal: Strength & Muscle Tone: N/A Gait & Station: N/A Patient leans: N/A  Psychiatric Specialty Exam: Review of Systems  Psychiatric/Behavioral: Positive for dysphoric mood and sleep disturbance. Negative for agitation, behavioral problems, confusion, decreased concentration, hallucinations, self-injury and suicidal ideas. The patient is not nervous/anxious and is not hyperactive.   All other systems reviewed and are negative.   There were no vitals taken for this visit.There is no height or weight on file to calculate BMI.  General Appearance: Fairly Groomed  Eye Contact:  Good  Speech:  Clear and Coherent  Volume:  Normal  Mood:  fine  Affect:  Appropriate, Congruent and Full Range  Thought Process:  Coherent  Orientation:  Full (Time, Place, and Person)  Thought Content: Logical   Suicidal Thoughts:  No  Homicidal Thoughts:  No  Memory:  Immediate;   Good  Judgement:  Good  Insight:  Fair  Psychomotor Activity:  Normal  Concentration:  Concentration: Good and Attention Span: Good  Recall:  Good  Fund of Knowledge: Good  Language: Good  Akathisia:  No  Handed:  Right  AIMS (if indicated): N/A  Assets:  Communication Skills Desire for Improvement  ADL's:  Intact  Cognition: WNL  Sleep:  Poor   Screenings: Mini-Mental     Clinical Support from 03/22/2020 in [facility], [place] from 03/22/2019 in [facility], [facility]  Total Score (max 30 points ) 30 30    PHQ2-9     Clinical Support from 03/22/2020 in [facility], [facility] Office Visit from 10/14/2019 in [facility], [facility] Office Visit from 07/14/2019 in [facility], [place] from 03/22/2019 in [facility], [facility] Office Visit from 02/07/2019 in [facility], [facility]  PHQ-2 Total Score 0 0 0 0 0       Assessment and Plan:  Susan [name] is a 63 y.o. year old female with a history of anxiety,bilateral osteoarthritis, COPD, s/p thyroidectomy, hypothyroidism, history ofCeliac Disease, GERD, Sjogren's syndrome, who presents for follow up appointment for below.   1. MDD (major depressive disorder), recurrent, in partial remission ([place]) 2. Generalized anxiety disorder Although she reports grief of loss of her sister-in-law, she denies any other significant mood symptoms.  Other psychosocial stressors include this loss of her parents a few years ago, her medical condition of pain and tremors.  Will continue current dose of venlafaxine to target depression and anxiety.  We will continue amitriptyline for depression, anxiety and pain.  Noted that although it is preferable to try lower dose of venlafaxine especially given her tremors, she prefers to stay on the current dose of the medication.  Will continue to monitor.   R/o tardive dyskinesia She reports worsening in tremors since being on Reglan.  Although valbenazine was discussed, she is not interested in this treatment.   Plan I have reviewed and updated plans as below 1. Continue venlafaxine 300 mg daily  2. Continueamitriptyline25 mg at night (anticholinergic side effect from higher dose) 3. Return to clinic- 5/4 at 8:40 for 20 mins, video (she will contact the office if she needs to cancel the appointment due to her visitation in [place]) - oncarisoprodol(Soma), gabapentin 300 mg BID - on xanax 0.5 mg  TID  Past trials of medication:sertraline, Paxil, venlafaxine, Xanax, Ritalin (worked very well)  The patient demonstrates the following risk factors for suicide: Chronic risk  factors for suicide include:psychiatric disorder ofanxiety. Acute risk factorsfor suicide include: N/A. Protective factorsfor this patient include: positive social support, responsibility to others (children, family), coping skills and hope for the future. Considering these factors, the overall suicide risk at this point appears to below.She owns a gun.Patientisappropriate for outpatient follow up.   [name], MD 09/26/2020, 11:23 AM

## 2020-09-19 NOTE — Progress Notes (Signed)
Office Visit Note  Patient: Susan [name]             Date of Birth: 03-01-1956           MRN: [id number]             PCP: Ronnell Freshwater, NP Referring: Ronnell Freshwater, NP Visit Date: 10/03/2020 Occupation: _0 @  Subjective:  Medication management.   History of Present Illness: Susan [name] is a 64 y.o. female with history of Sjogren's, osteoarthritis.  She states her sicca symptoms are better since she has been taking Plaquenil.  She continues to have some discomfort in her joints especially her hands, knees and her feet due to underlying osteoarthritis.  Her fibromyalgia symptoms flare off and on.  She has been having intermittent rash on her chest.  She states she gets frequent rash on her scalp for which she uses doxycycline intermittently.  Activities of Daily Living:  Patient reports morning stiffness for 1  hour.   Patient Reports nocturnal pain.  Difficulty dressing/grooming: Reports Difficulty climbing stairs: Denies Difficulty getting out of chair: Denies Difficulty using hands for taps, buttons, cutlery, and/or writing: Reports  Review of Systems  Constitutional: Positive for fatigue. Negative for night sweats, weight gain and weight loss.  HENT: Positive for mouth dryness and nose dryness. Negative for trouble swallowing and trouble swallowing.   Eyes: Positive for pain, itching and dryness. Negative for redness and visual disturbance.  Respiratory: Positive for shortness of breath. Negative for cough and difficulty breathing.        History of asthma  Cardiovascular: Negative for chest pain, palpitations, hypertension, irregular heartbeat and swelling in legs/feet.  Gastrointestinal: Positive for constipation. Negative for blood in stool and diarrhea.  Endocrine: Negative for increased urination.  Genitourinary: Negative for difficulty urinating and vaginal dryness.  Musculoskeletal: Positive for arthralgias, joint pain, myalgias, morning stiffness,  muscle tenderness and myalgias. Negative for muscle weakness.  Skin: Positive for rash. Negative for color change, hair loss, redness, skin tightness, ulcers and sensitivity to sunlight.  Allergic/Immunologic: Negative for susceptible to infections.  Neurological: Positive for headaches and weakness. Negative for dizziness, numbness, memory loss and night sweats.  Hematological: Negative for bruising/bleeding tendency and swollen glands.  Psychiatric/Behavioral: Positive for depressed mood and sleep disturbance. Negative for confusion. The patient is nervous/anxious.        Uses CPAP    PMFS History:  Patient Active Problem List   Diagnosis Date Noted  . 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 Essential tremor 06/20/2020  . OSA (obstructive sleep apnea) 03/26/2020  . Chronic wrist pain, right 11/02/2019  . Chronic pain of left wrist 11/02/2019  . Pain in joints of right hand 11/02/2019  . Pain in joint of left hand 11/02/2019  . Primary osteoarthritis of both hands 11/02/2019  . MDD (major depressive disorder), recurrent, in partial remission ([place]) 10/05/2019  . Dysuria 04/03/2019  . Generalized anxiety disorder 02/28/2019  . Chronic pain of both knees 03/01/2018  . Encounter for general adult medical examination with abnormal findings 03/01/2018  . Primary osteoarthritis of both first carpometacarpal joints 03/01/2018  . Chronic obstructive pulmonary disease, unspecified ([place]) 12/04/2017  . Shortness of breath 12/04/2017  . Allergic rhinitis, unspecified 12/04/2017  . 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 Vaginal atrophy 09/01/2017  . Chronic right-sided low back pain without sciatica 01/05/2017  . Primary Sjogren's syndrome ([place]) 01/05/2017  . CD (  celiac disease) 04/17/2015  . Gastroesophageal reflux disease with esophagitis 04/17/2015  . Hx of Sjogren's disease ([place]) 04/17/2015  . Acute  asthma exacerbation 10/14/2013  . Acute respiratory failure ([place]) 10/14/2013  . Gastritis 10/08/2013  . Bradycardia 10/07/2013  . Esophageal spasm 10/06/2013  . Substernal precordial chest pain 10/06/2013  . Chronic anxiety 10/06/2013  . GERD (gastroesophageal reflux disease) 10/06/2013  . Chronic asthma 10/06/2013  . 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 Depression with anxiety 10/06/2013  . Fibromyalgia 10/06/2013  . Hypothyroidism 10/06/2013  . Chronic migraine 10/06/2013    Past Medical History:  Diagnosis Date  . Arthritis   . Asthma   . Asthmatic bronchitis   . BRCA negative 05/2014   MyRisk neg  . Celiac disease   . Celiac sprue   . Chronic anxiety   . Complication of anesthesia    vomiting  . COPD (chronic obstructive pulmonary disease) ([place])   . Depression   . Environmental allergies   . Family history of breast cancer 05/2014   MyRisk neg; IBIS=29.5%  . Fibromyalgia    neuropathy all over  . Gastritis 10/08/2013  . GERD (gastroesophageal reflux disease)   . Hyperlipemia   . Hyperlipidemia    Intolerant to statins  . Hypothyroidism (acquired)   . Increased risk of breast cancer 05/2014   IBIS=29.5%  . Increased risk of breast cancer 05/2014   IBIS=29.5%  . Migraine   . Migraines   . 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 Pancreatitis   . Sjogren's disease ([place])   . Spastic colon   . Thyroid cancer ([place]) 1990 and 1994   Total thyroidectomy with radioactive iodine.     Family History  Problem Relation Age of Onset  . Arthritis/Rheumatoid Mother        died in age 46s  . Breast cancer Mother 37  . Breast cancer Maternal Grandmother        40's  . Breast cancer Maternal Aunt        X 2. 50's  . Lung cancer Maternal Aunt   . Pancreatitis Neg Hx   . Colon cancer Neg Hx    Past Surgical History:  Procedure Laterality Date  . ABDOMINAL HYSTERECTOMY  2006  . BLADDER REPAIR    . BREAST CYST ASPIRATION Left yrs ago  . CHOLECYSTECTOMY  1989  . COLONOSCOPY  WITH ESOPHAGOGASTRODUODENOSCOPY (EGD)    . COLONOSCOPY WITH PROPOFOL N/A 08/03/2017   Procedure: COLONOSCOPY WITH PROPOFOL;  Surgeon: Lollie Sails, MD;  Location: [facility] ENDOSCOPY;  Service: Endoscopy;  Laterality: N/A;  . COLONOSCOPY WITH PROPOFOL N/A 10/18/2018   Procedure: COLONOSCOPY WITH PROPOFOL;  Surgeon: Lollie Sails, MD;  Location: [facility] ENDOSCOPY;  Service: Endoscopy;  Laterality: N/A;  . ESOPHAGOGASTRODUODENOSCOPY N/A 10/07/2013   Procedure: ESOPHAGOGASTRODUODENOSCOPY (EGD);  Surgeon: Danie Binder, MD;  Location: [facility] ENDO SUITE;  Service: Endoscopy;  Laterality: N/A;  patient received heparin at 530am given phenergan 93m IV 30 minutes before  . EYE SURGERY     lacrimal gland   . FLEXIBLE BRONCHOSCOPY N/A 06/04/2016   Procedure: FLEXIBLE BRONCHOSCOPY;  Surgeon: SAllyne Gee MD;  Location: [facility] ORS;  Service: Pulmonary;  Laterality: N/A;  . INCONTINENCE SURGERY  2004  . NASAL SINUS SURGERY    . THYROID LOBECTOMY    . TOTAL THYROIDECTOMY     Social History   Social History Narrative  . Not on file   Immunization History  Administered Date(s) Administered  . Influenza Inj Mdck Quad Pf 07/22/2018,  07/05/2019, 06/27/2020  . Influenza-Unspecified 08/17/2017  . Moderna SARS-COVID-2 Vaccination 12/03/2019, 01/24/2020, 08/13/2020  . Tdap 07/27/2019  . Zoster Recombinat (Shingrix) 08/21/2017, 12/08/2017     Objective: Vital Signs: BP 109/75 (BP Location: Left Arm, Patient Position: Sitting, Cuff Size: Normal)   Pulse 82   Resp 15   Ht _0  (1.651 m)   Wt 185 lb (83.9 kg)   BMI 30.79 kg/m    Physical Exam Vitals and nursing note reviewed.  Constitutional:      Appearance: She is well-developed.  HENT:     Head: Normocephalic and atraumatic.  Eyes:     Conjunctiva/sclera: Conjunctivae normal.  Cardiovascular:     Rate and Rhythm: Normal rate and regular rhythm.     Heart sounds: Normal heart sounds.  Pulmonary:     Effort: Pulmonary effort is normal.      Breath sounds: Normal breath sounds.  Abdominal:     General: Bowel sounds are normal.     Palpations: Abdomen is soft.  Musculoskeletal:     Cervical back: Normal range of motion.  Lymphadenopathy:     Cervical: No cervical adenopathy.  Skin:    General: Skin is warm and dry.     Capillary Refill: Capillary refill takes less than 2 seconds.     Comments: Erythematous impetiginous 2 lesions were noted on her chest  Neurological:     Mental Status: She is alert and oriented to person, place, and time.  Psychiatric:        Behavior: Behavior normal.      Musculoskeletal Exam: Some stiffness with range of motion of her cervical spine.  Shoulder joints, elbow joints, wrist joints with good range of motion.  She has bilateral CMC, PIP and DIP thickening.  Hip joints with good range of motion.  She had some discomfort range of motion of her knee joints with no warmth swelling or effusion.  She has DIP and PIP thickening in her feet but no tenderness.  Some generalized hyperalgesia was noted.  CDAI Exam: CDAI Score: -- Patient Global: --; Provider Global: -- Swollen: --; Tender: -- Joint Exam 10/03/2020   No joint exam has been documented for this visit   There is currently no information documented on the homunculus. Go to the Rheumatology activity and complete the homunculus joint exam.  Investigation: No additional findings.  Imaging: No results found.  Recent Labs: Lab Results  Component Value Date   WBC 5.2 08/22/2020   HGB 12.6 08/22/2020   PLT 363 08/22/2020   NA 140 08/22/2020   K 4.7 08/22/2020   CL 106 08/22/2020   CO2 21 08/22/2020   GLUCOSE 94 08/22/2020   BUN 12 08/22/2020   CREATININE 0.87 08/22/2020   BILITOT <0.2 08/22/2020   ALKPHOS 79 08/22/2020   AST 19 08/22/2020   ALT 24 08/22/2020   PROT 7.2 08/22/2020   ALBUMIN 4.4 08/22/2020   CALCIUM 9.7 08/22/2020   GFRAA 81 08/22/2020    Speciality Comments: PLQ Eye Exam: 09/28/2020 WNL @ [facility] Follow up in 1 year  Procedures:  No procedures performed Allergies: Aspirin, Keflex [cephalexin], Azithromycin, Barley grass, Clarithromycin, Metoclopramide, Oat, Oatmeal, Other, Rye grass flower pollen extract [gramineae pollens], Vioxx [rofecoxib], Wheat bran, Wheat extract, Celebrex [celecoxib], and Metoclopramide hcl   Assessment / Plan:     Visit Diagnoses: Sjogren's syndrome with other organ involvement ([place]) - Diagnosed at [facility] 12 years ago and was placed on Plaquenil.  History of dry mouth, dental  decay and dry eyes.  ANA positive, Ro negative, low negative, RF negative.  She states her symptoms have improved since she has been taking Plaquenil.  She has been tolerating the medication well.  Over-the-counter products were discussed.  High risk medication use - Plaquenil 200 mg p.o. twice daily -her labs in October were normal.  She is going to [place] in December and will not be back until May.  I will get labs today and then in May.  Baseline examination on September 28, 2020 was normal.  Plan: CBC with Differential/Platelet, COMPLETE METABOLIC PANEL WITH GFR.  She requested a refill for Plaquenil 90-day supply as she is going to [place].  She will pick it up towards the end of the month.  Primary osteoarthritis of both hands-she has severe CMC, PIP and DIP thickening and discomfort.  Joint protection muscle strengthening was discussed.  Primary osteoarthritis of both knees - Bilateral moderate osteoarthritis and moderate to severe chondromalacia patella.  She will be going to [place] and she states she likes to swim and bike which helps her a lot.  Primary osteoarthritis of both feet-proper fitting shoes were discussed.  Fibromyalgia-she continues to have some discomfort from fibromyalgia.  Water aerobics were discussed.  Other medical problems are listed as follows:  Chronic pain syndrome  CD (celiac disease)  Nutcracker esophagus  Esophageal spasm  Chronic obstructive  pulmonary disease, unspecified COPD type ([facility])  History of gastroesophageal reflux (GERD)  Generalized anxiety disorder  MDD (major depressive disorder), recurrent, in partial remission ([place])  History of asthma  History of thyroid cancer  Family history of rheumatoid arthritis  Osteoporosis screening - She is supposed to have DEXA scan through her PCP.  Orders: Orders Placed This Encounter  Procedures  . CBC with Differential/Platelet  . COMPLETE METABOLIC PANEL WITH GFR   Meds ordered this encounter  Medications  . hydroxychloroquine (PLAQUENIL) 200 MG tablet    Sig: Take 1 tablet (200 mg total) by mouth 2 (two) times daily.    Dispense:  180 tablet    Refill:  0      Follow-Up Instructions: Return in about 5 months (around 03/03/2021) for Sjogren's, Osteoarthritis.   Bo Merino, MD  Note - This record has been created using Editor, commissioning.  Chart creation errors have been sought, but may not always  have been located. Such creation errors do not reflect on  the standard of medical care.

## 2020-09-21 IMAGING — MG DIGITAL SCREENING BILATERAL MAMMOGRAM WITH TOMO AND CAD
8 series · 8 of 24 positions shown · non-contrast
Comparison: Previous exam(s).

CLINICAL DATA: Screening.

EXAM:
DIGITAL SCREENING BILATERAL MAMMOGRAM WITH TOMO AND CAD

[R CC synth-2D]
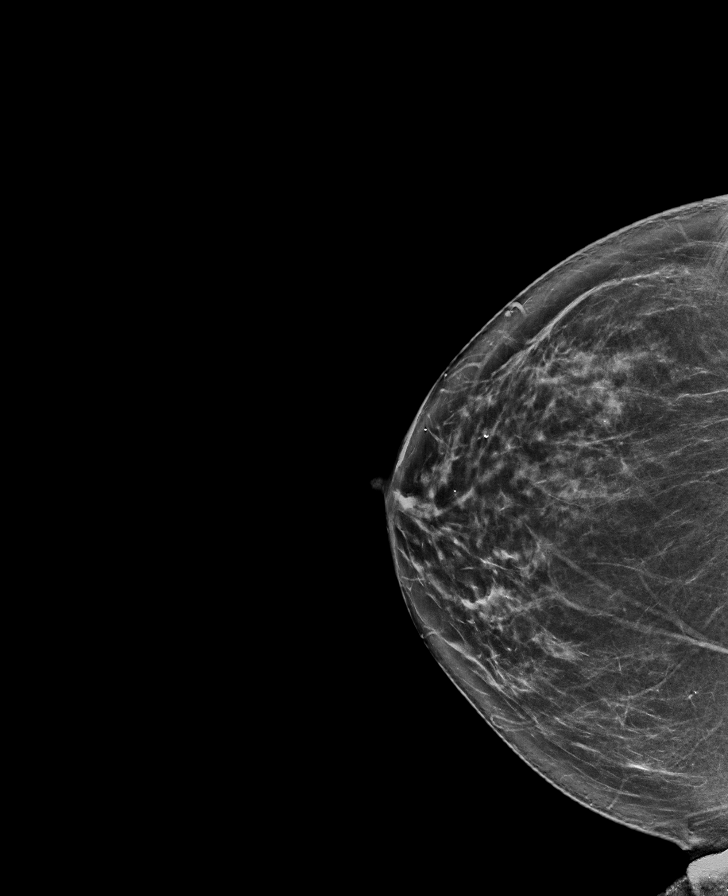

[R MLO synth-2D]
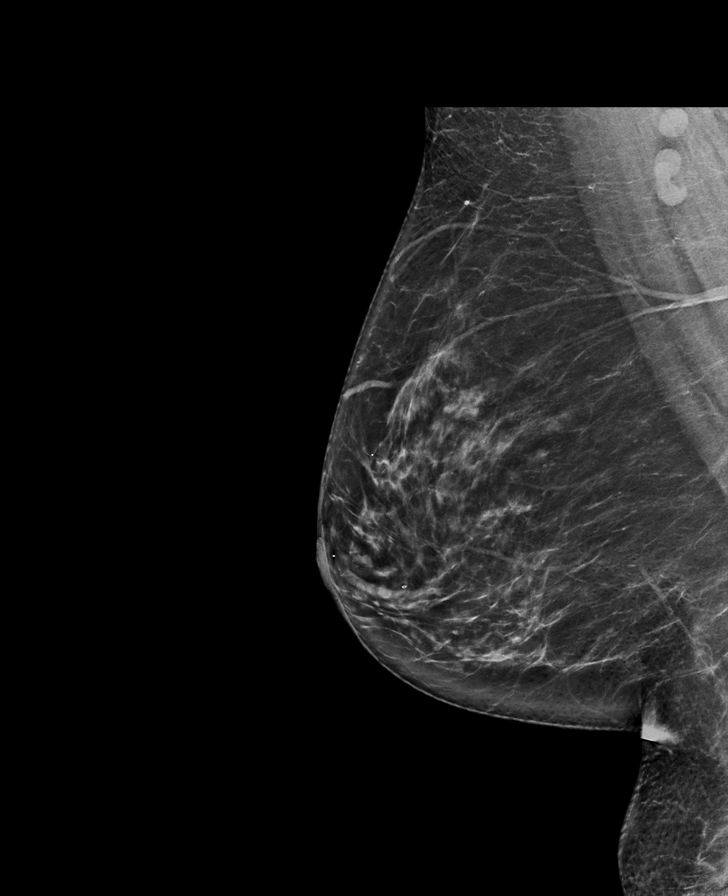

[L MLO synth-2D]
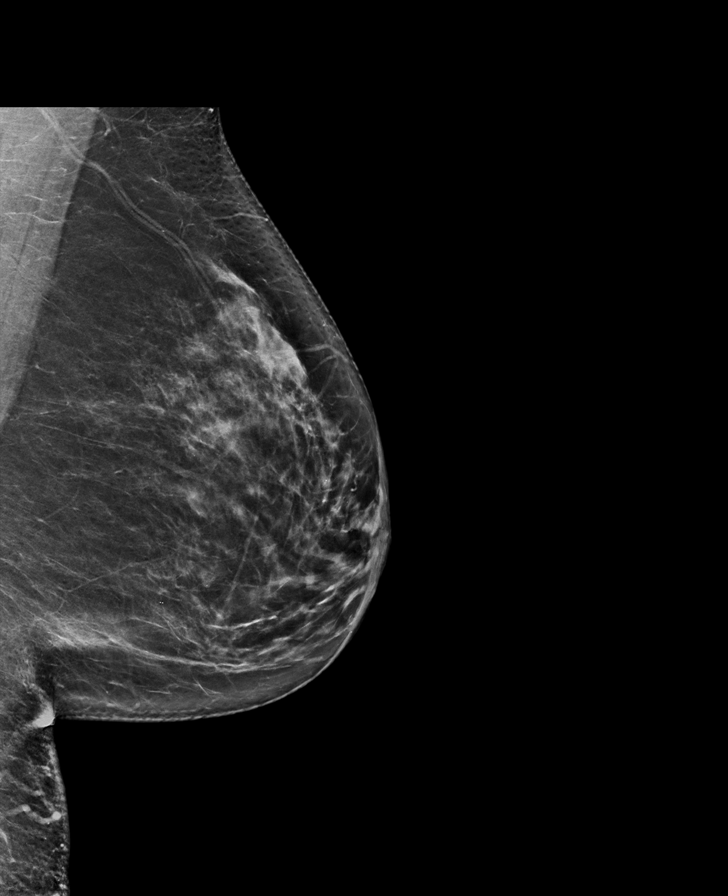

[L CC synth-2D]
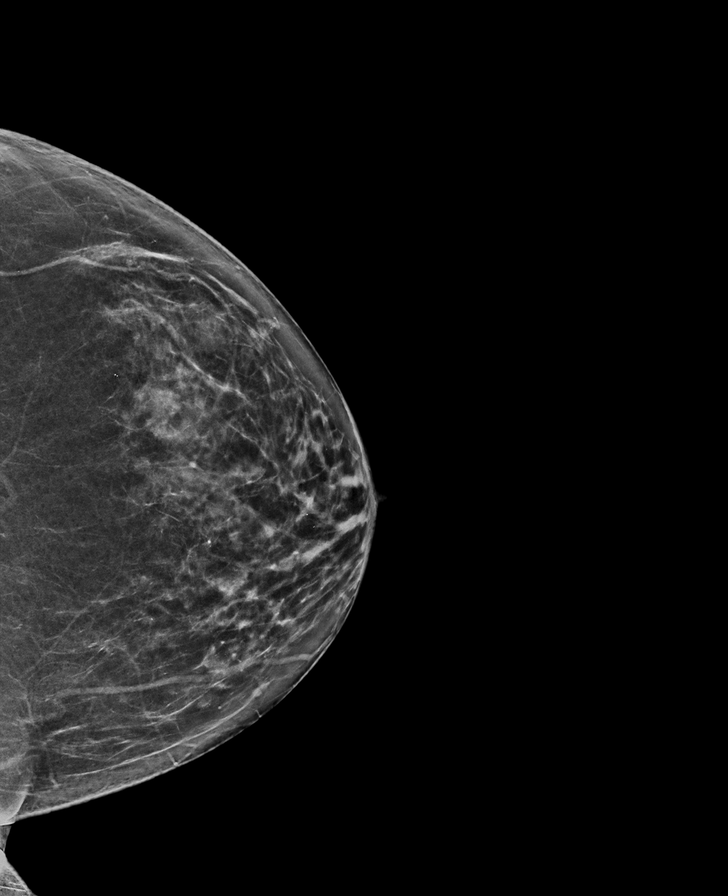

[R MLO tomo · tomo slice 43/85.0]
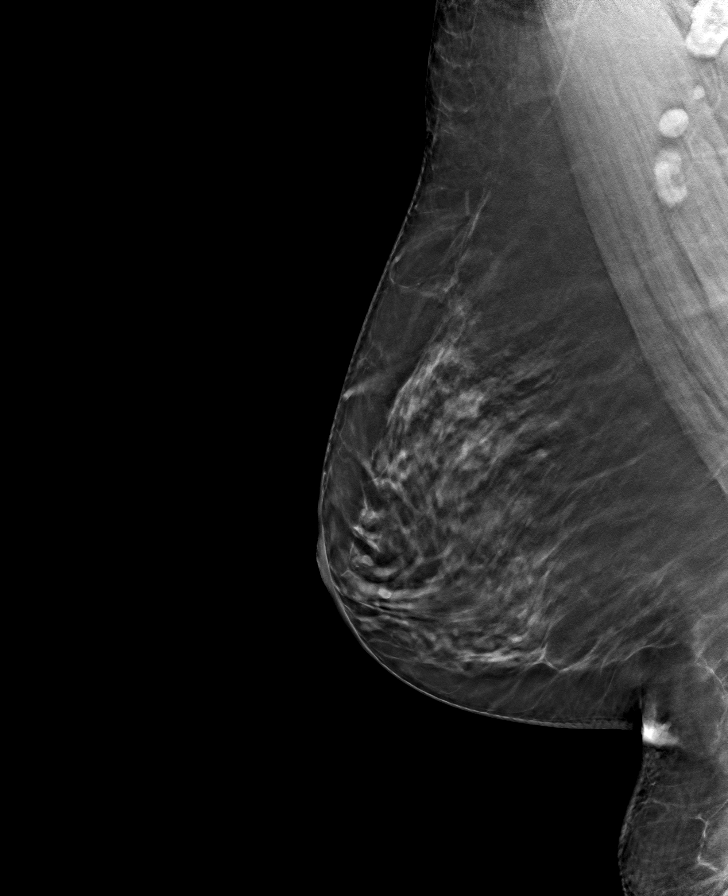

[R CC tomo · tomo slice 38/75.0]
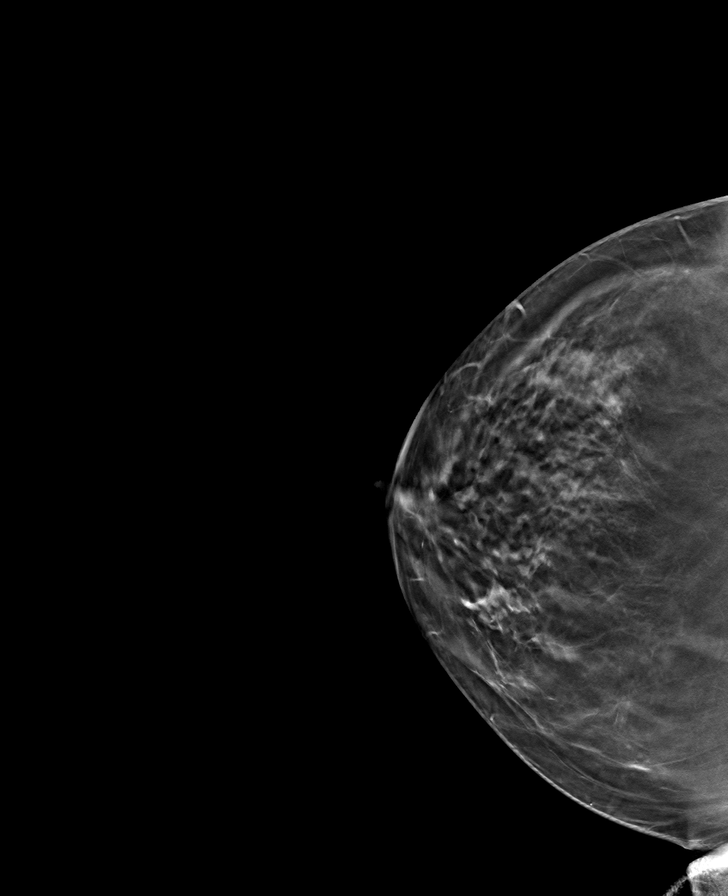

[L MLO tomo · tomo slice 39/77.0]
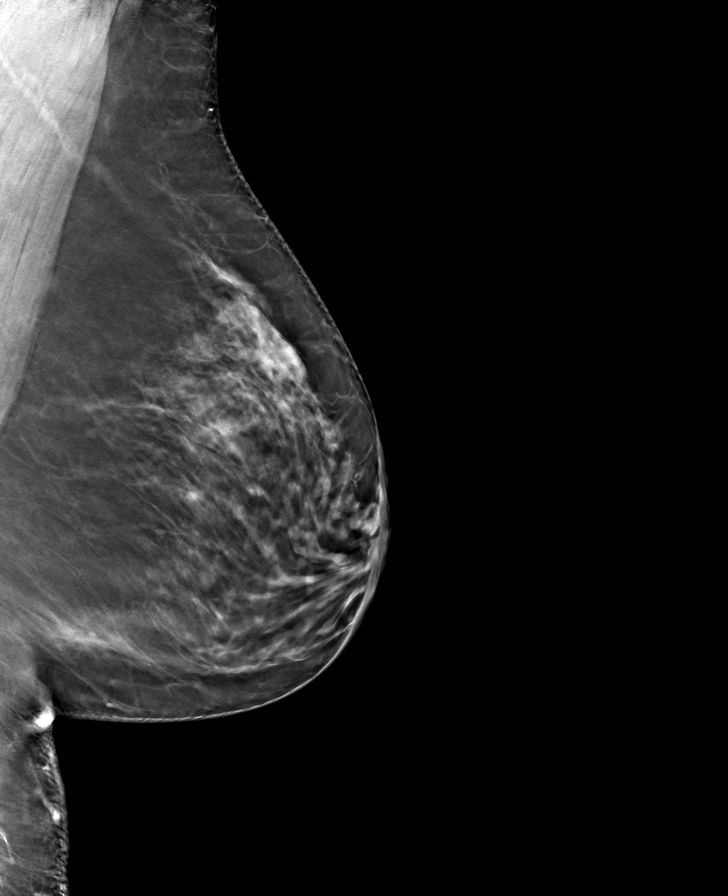

[L CC tomo · tomo slice 36/71.0]
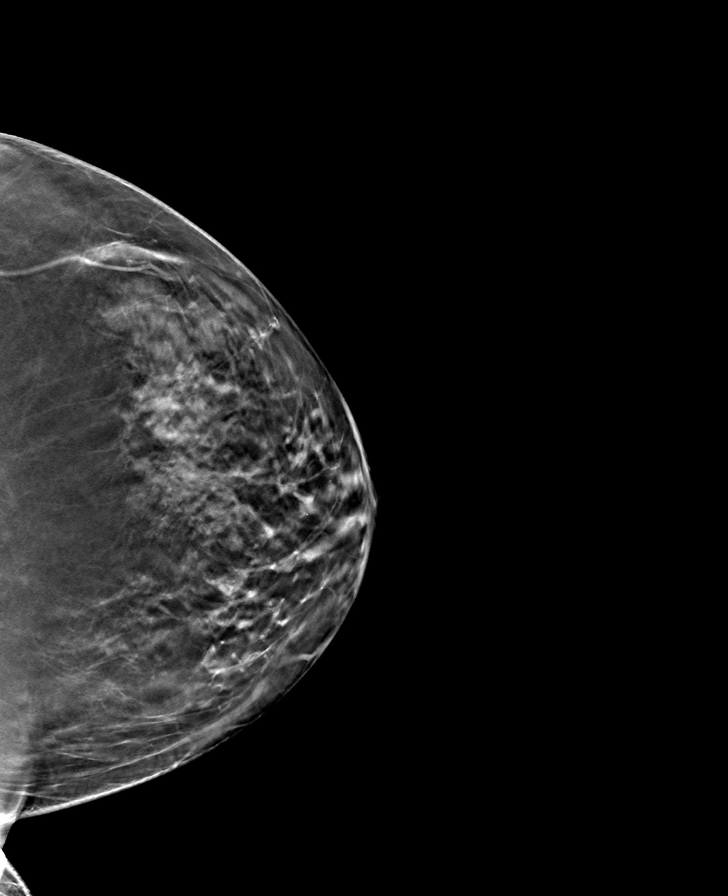

[8 of 24 positions shown; findings below may reference images not displayed]

ACR Breast Density Category b: There are scattered areas of
fibroglandular density.
FINDINGS: There are no findings suspicious for malignancy. Images were
processed with CAD.
IMPRESSION: No mammographic evidence of malignancy. A result letter of this
screening mammogram will be mailed directly to the patient.

RECOMMENDATION:
Screening mammogram in one year. (Code:[id number])

BI-RADS CATEGORY  1: Negative.

## 2020-09-22 ENCOUNTER — Other Ambulatory Visit: Payer: Self-pay | Admitting: Physician Assistant

## 2020-09-23 DIAGNOSIS — G4733 Obstructive sleep apnea (adult) (pediatric): Secondary | ICD-10-CM | POA: Diagnosis not present

## 2020-09-24 NOTE — Telephone Encounter (Addendum)
Last Visit: 02/29/2020  Next Visit: 10/03/2020 Labs: 08/22/2020 CBC and CMP normal. Eye exam: no baseline PLQ eye exam on file  Current Dose per office note 02/29/2020: Plaquenil 200 mg p.o. twice daily DX: Sjogren's syndrome with other organ involvement  Patient advised she is in need of PLQ eye exam. Patient states she has one scheduled for 09/28/2020.   Okay to refill PLQ?

## 2020-09-26 ENCOUNTER — Encounter: Payer: Self-pay | Admitting: Psychiatry

## 2020-09-26 ENCOUNTER — Telehealth (INDEPENDENT_AMBULATORY_CARE_PROVIDER_SITE_OTHER): Payer: Medicare HMO | Admitting: Psychiatry

## 2020-09-26 ENCOUNTER — Telehealth (HOSPITAL_COMMUNITY): Payer: Medicare HMO | Admitting: Psychiatry

## 2020-09-26 ENCOUNTER — Other Ambulatory Visit: Payer: Self-pay

## 2020-09-26 DIAGNOSIS — F3341 Major depressive disorder, recurrent, in partial remission: Secondary | ICD-10-CM | POA: Diagnosis not present

## 2020-09-26 DIAGNOSIS — R69 Illness, unspecified: Secondary | ICD-10-CM | POA: Diagnosis not present

## 2020-09-26 DIAGNOSIS — F411 Generalized anxiety disorder: Secondary | ICD-10-CM | POA: Diagnosis not present

## 2020-09-26 MED ORDER — VENLAFAXINE HCL ER 150 MG PO CP24: 300.0000 mg | ORAL_CAPSULE | Freq: Every day | ORAL | 1 refills | Status: DC

## 2020-09-26 MED ORDER — AMITRIPTYLINE HCL 25 MG PO TABS: 25.0000 mg | ORAL_TABLET | Freq: Every day | ORAL | 1 refills | Status: DC

## 2020-09-26 NOTE — Patient Instructions (Signed)
1. Continue venlafaxine 300 mg daily  2. Continueamitriptyline25 mg at night  3. Return to clinic- 5/4 at 8:40

## 2020-09-28 ENCOUNTER — Other Ambulatory Visit: Payer: Self-pay

## 2020-09-28 ENCOUNTER — Ambulatory Visit: Payer: Medicare HMO

## 2020-09-28 ENCOUNTER — Ambulatory Visit (INDEPENDENT_AMBULATORY_CARE_PROVIDER_SITE_OTHER): Payer: Medicare HMO | Admitting: Nurse Practitioner

## 2020-09-28 ENCOUNTER — Ambulatory Visit (INDEPENDENT_AMBULATORY_CARE_PROVIDER_SITE_OTHER): Payer: Medicare HMO

## 2020-09-28 ENCOUNTER — Encounter (INDEPENDENT_AMBULATORY_CARE_PROVIDER_SITE_OTHER): Payer: Self-pay | Admitting: Nurse Practitioner

## 2020-09-28 VITALS — BP 125/80 | HR 79 | Ht 65.0 in | Wt 184.0 lb

## 2020-09-28 DIAGNOSIS — I83813 Varicose veins of bilateral lower extremities with pain: Secondary | ICD-10-CM

## 2020-09-28 DIAGNOSIS — M79604 Pain in right leg: Secondary | ICD-10-CM | POA: Diagnosis not present

## 2020-09-28 DIAGNOSIS — M79605 Pain in left leg: Secondary | ICD-10-CM

## 2020-09-28 DIAGNOSIS — R591 Generalized enlarged lymph nodes: Secondary | ICD-10-CM

## 2020-09-28 DIAGNOSIS — M3501 Sicca syndrome with keratoconjunctivitis: Secondary | ICD-10-CM | POA: Diagnosis not present

## 2020-09-28 DIAGNOSIS — M180 Bilateral primary osteoarthritis of first carpometacarpal joints: Secondary | ICD-10-CM

## 2020-09-28 DIAGNOSIS — Z79899 Other long term (current) drug therapy: Secondary | ICD-10-CM | POA: Diagnosis not present

## 2020-10-01 ENCOUNTER — Ambulatory Visit: Payer: Medicare HMO | Admitting: Nurse Practitioner

## 2020-10-02 ENCOUNTER — Encounter: Payer: Self-pay | Admitting: Internal Medicine

## 2020-10-02 ENCOUNTER — Other Ambulatory Visit: Payer: Self-pay

## 2020-10-02 ENCOUNTER — Ambulatory Visit: Payer: Medicare HMO | Admitting: Internal Medicine

## 2020-10-02 VITALS — BP 120/76 | HR 85 | Temp 97.3°F | Resp 16 | Ht 65.0 in | Wt 184.8 lb

## 2020-10-02 DIAGNOSIS — G43019 Migraine without aura, intractable, without status migrainosus: Secondary | ICD-10-CM | POA: Diagnosis not present

## 2020-10-02 DIAGNOSIS — R590 Localized enlarged lymph nodes: Secondary | ICD-10-CM

## 2020-10-02 DIAGNOSIS — R0602 Shortness of breath: Secondary | ICD-10-CM | POA: Diagnosis not present

## 2020-10-02 DIAGNOSIS — G4700 Insomnia, unspecified: Secondary | ICD-10-CM | POA: Diagnosis not present

## 2020-10-02 DIAGNOSIS — J4520 Mild intermittent asthma, uncomplicated: Secondary | ICD-10-CM

## 2020-10-02 DIAGNOSIS — G250 Essential tremor: Secondary | ICD-10-CM | POA: Diagnosis not present

## 2020-10-02 DIAGNOSIS — Z9989 Dependence on other enabling machines and devices: Secondary | ICD-10-CM

## 2020-10-02 DIAGNOSIS — G4733 Obstructive sleep apnea (adult) (pediatric): Secondary | ICD-10-CM

## 2020-10-02 DIAGNOSIS — M797 Fibromyalgia: Secondary | ICD-10-CM | POA: Diagnosis not present

## 2020-10-02 NOTE — Progress Notes (Signed)
[facility] [place], [place] [address]  Internal MEDICINE  Office Visit Note  Patient Name: Susan [name]  [id number]  [id number]  Date of Service: 10/02/2020  Chief Complaint  Patient presents with  . Follow-up    cpap compliance  . Asthma    HPI Pt is here for pulmonary follow up OSA on CPAP 9 cm of H2O, doing well. Compliance is good as well Asthma is well controlled, no recent flare ups, spiro and PFT are normal  She is not on any rescue inhalers, just takes Singulair  Soft tissue U/S results are here, WNL   Current Medication: Outpatient Encounter Medications as of 10/02/2020  Medication Sig  . acetaminophen (TYLENOL) 500 MG tablet Take by mouth.  Marland Kitchen albuterol (PROVENTIL HFA;VENTOLIN HFA) 108 (90 Base) MCG/ACT inhaler Inhale 2 puffs into the lungs every 6 (six) hours as needed for wheezing or shortness of breath.  Marland Kitchen albuterol (PROVENTIL) (2.5 MG/3ML) 0.083% nebulizer solution Take 3 mLs (2.5 mg total) by nebulization every 6 (six) hours as needed for wheezing or shortness of breath.  . ALPRAZolam (XANAX) 0.5 MG tablet Take 1 tablet (0.5 mg total) by mouth 2 (two) times daily as needed for anxiety or sleep.  Marland Kitchen amitriptyline (ELAVIL) 25 MG tablet Take 1 tablet (25 mg total) by mouth at bedtime.  . Ascorbic Acid (VITAMIN C) 100 MG tablet Take 100 mg by mouth daily.  . calcium carbonate (OS-CAL) 1250 (500 Ca) MG chewable tablet Chew by mouth.  . carisoprodol (SOMA) 350 MG tablet Take 350 mg by mouth at bedtime. Patient states that she takes 1/2 tablet nightly  . CLIMARA 0.1 MG/24HR patch APPLY 1 PATCH TO SKIN ONCE WEEKLY  . Dentifrices (BIOTENE DRY MOUTH DT) by Transmucosal route.  . doxycycline (VIBRAMYCIN) 50 MG capsule Take one cap po QD-BID. Take with food.  . doxycycline (VIBRAMYCIN) 50 MG capsule Take 1 capsule (50 mg total) by mouth daily.  Marland Kitchen eletriptan (RELPAX) 40 MG tablet Take 40 mg by mouth as needed for migraine. may repeat in 2 hours if  necessary   . EPINEPHRINE 0.3 mg/0.3 mL IJ SOAJ injection USE AS DIRECTED  . ezetimibe (ZETIA) 10 MG tablet Take 1 tablet (10 mg total) by mouth daily.  . famotidine (PEPCID) 10 MG tablet Take 20 mg by mouth.  . fenofibrate (TRICOR) 145 MG tablet Take 1 tablet (145 mg total) by mouth daily.  . fluticasone (FLONASE) 50 MCG/ACT nasal spray Place 2 sprays into both nostrils daily.  . fluticasone furoate-vilanterol (BREO ELLIPTA) 100-25 MCG/INH AEPB Inhale 1 puff into the lungs daily.  Marland Kitchen gabapentin (NEURONTIN) 600 MG tablet Take 1/2 (one-half) tablet by mouth twice daily  . HYDROcodone-acetaminophen (NORCO) 7.5-325 MG tablet Take 1-2 tablets by mouth every 6 (six) hours as needed for moderate pain.  . hydroxychloroquine (PLAQUENIL) 200 MG tablet TAKE 1 TABLET BY MOUTH TWICE A DAY  . hyoscyamine (NULEV) 0.125 MG TBDP Place 0.125 mg under the tongue every 4 (four) hours as needed for bladder spasms.   . Lactobacillus (DIGESTIVE HEALTH PROBIOTIC PO) Take 1 tablet by mouth daily.  Marland Kitchen levothyroxine (SYNTHROID) 137 MCG tablet Take 1 tablet (137 mcg total) by mouth daily before breakfast.  . LINZESS 290 MCG CAPS capsule Take 290 mcg by mouth daily.  . Magnesium Oxide (NATRUL MAGNESIUM PO) Take by mouth.  . montelukast (SINGULAIR) 10 MG tablet TAKE ONE TABLET BY MOUTH ONCE DAILY FOR ASTHMA  . Multiple Vitamins-Minerals (HAIR SKIN AND NAILS FORMULA  PO) Take by mouth.  . mupirocin ointment (BACTROBAN) 2 % as needed.   . pantoprazole (PROTONIX) 40 MG tablet Take 40 mg by mouth daily.  Vladimir Faster Glycol-Propyl Glycol 0.4-0.3 % SOLN Apply to eye.  Marland Kitchen PRESCRIPTION MEDICATION 1 Syringe by Subdermal route once a week. Allergy shot  . Probiotic Product ([place]) CAPS Take by mouth.  . sodium chloride (OCEAN) 0.65 % SOLN nasal spray Place 1 spray into both nostrils as needed for congestion.  . sodium chloride 0.9 % nebulizer solution   . SUMAtriptan-naproxen (TREXIMET) 85-500 MG tablet daily as  needed.   . Tuberculin-Allergy Syringes 28G X 1/2" 1 ML MISC 1 each by Does not apply route every 14 (fourteen) days. For allergy injections  . Vaginal Lubricant (REPLENS) GEL Place vaginally.  Derrill Memo ON 11/21/2020] venlafaxine XR (EFFEXOR-XR) 150 MG 24 hr capsule Take 2 capsules (300 mg total) by mouth at bedtime.  . Vitamin D, Ergocalciferol, (DRISDOL) 1.25 MG (50000 UNIT) CAPS capsule TAKE 1 CAPSULE (50,000 UNITS TOTAL) BY MOUTH EVERY 7 (SEVEN) DAYS. MONDAY  . vitamin E 1000 UNIT capsule Take by mouth.  Marland Kitchen White Petrolatum-Mineral Oil ([place] PETROL-MINERAL OIL-LANOLIN) 0.1-0.1 % OINT   . [DISCONTINUED] amitriptyline (ELAVIL) 25 MG tablet Take 1 tablet (25 mg total) by mouth at bedtime.  . [DISCONTINUED] ezetimibe (ZETIA) 10 MG tablet Take 1 tablet (10 mg total) by mouth daily.  . [DISCONTINUED] fenofibrate (TRICOR) 145 MG tablet Take 1 tablet (145 mg total) by mouth daily.  . [DISCONTINUED] hydroxychloroquine (PLAQUENIL) 200 MG tablet Take 1 tablet (200 mg total) by mouth 2 (two) times daily.  . [DISCONTINUED] montelukast (SINGULAIR) 10 MG tablet TAKE ONE TABLET BY MOUTH ONCE DAILY FOR ASTHMA  . [DISCONTINUED] venlafaxine XR (EFFEXOR-XR) 150 MG 24 hr capsule Take 2 capsules (300 mg total) by mouth at bedtime.   No facility-administered encounter medications on file as of 10/02/2020.    Surgical History: Past Surgical History:  Procedure Laterality Date  . ABDOMINAL HYSTERECTOMY  2006  . BLADDER REPAIR    . BREAST CYST ASPIRATION Left yrs ago  . CHOLECYSTECTOMY  1989  . COLONOSCOPY WITH ESOPHAGOGASTRODUODENOSCOPY (EGD)    . COLONOSCOPY WITH PROPOFOL N/A 08/03/2017   Procedure: COLONOSCOPY WITH PROPOFOL;  Surgeon: Lollie Sails, MD;  Location[facility] ENDOSCOPY;  Service: Endoscopy;  Laterality: N/A;  . COLONOSCOPY WITH PROPOFOL N/A 10/18/2018   Procedure: COLONOSCOPY WITH PROPOFOL;  Surgeon: Lollie Sails, MD;  Location: [facility] ENDOSCOPY;  Service: Endoscopy;  Laterality: N/A;  .  ESOPHAGOGASTRODUODENOSCOPY N/A 10/07/2013   Procedure: ESOPHAGOGASTRODUODENOSCOPY (EGD);  Surgeon: Danie Binder, MD;  Location: [facility] ENDO SUITE;  Service: Endoscopy;  Laterality: N/A;  patient received heparin at 530am given phenergan 70m IV 30 minutes before  . EYE SURGERY     lacrimal gland   . FLEXIBLE BRONCHOSCOPY N/A 06/04/2016   Procedure: FLEXIBLE BRONCHOSCOPY;  Surgeon: SAllyne Gee MD;  Location: [facility] ORS;  Service: Pulmonary;  Laterality: N/A;  . INCONTINENCE SURGERY  2004  . NASAL SINUS SURGERY    . THYROID LOBECTOMY    . TOTAL THYROIDECTOMY      Medical History: Past Medical History:  Diagnosis Date  . Arthritis   . Asthma   . Asthmatic bronchitis   . BRCA negative 05/2014   MyRisk neg  . Celiac disease   . Celiac sprue   . Chronic anxiety   . Complication of anesthesia    vomiting  . COPD (chronic obstructive pulmonary disease) ([place]   .  Depression   . Environmental allergies   . Family history of breast cancer 05/2014   MyRisk neg; IBIS=29.5%  . Fibromyalgia    neuropathy all over  . Gastritis 10/08/2013  . GERD (gastroesophageal reflux disease)   . Hyperlipemia   . Hyperlipidemia    Intolerant to statins  . Hypothyroidism (acquired)   . Increased risk of breast cancer 05/2014   IBIS=29.5%  . Increased risk of breast cancer 05/2014   IBIS=29.5%  . Migraine   . Migraines   . 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 Pancreatitis   . Sjogren's disease ([place])   . Spastic colon   . Thyroid cancer ([place]) 1990 and 1994   Total thyroidectomy with radioactive iodine.     Family History: Family History  Problem Relation Age of Onset  . Arthritis/Rheumatoid Mother        died in age 21s  . Breast cancer Mother 29  . Breast cancer Maternal Grandmother        40's  . Breast cancer Maternal Aunt        X 2. 50's  . Lung cancer Maternal Aunt   . Pancreatitis Neg Hx   . Colon cancer Neg Hx     Social History   Socioeconomic History  . Marital status:  Married    Spouse name: Not on file  . Number of children: 0  . Years of education: Not on file  . Highest education level: Not on file  Occupational History  . Not on file  Tobacco Use  . Smoking status: Never Smoker  . Smokeless tobacco: Never Used  Vaping Use  . Vaping Use: Never used  Substance and Sexual Activity  . Alcohol use: No  . Drug use: No  . Sexual activity: Yes    Birth control/protection: Surgical  Other Topics Concern  . Not on file  Social History Narrative  . Not on file   Social Determinants of Health   Financial Resource Strain:   . Difficulty of Paying Living Expenses: Not on file  Food Insecurity:   . Worried About Charity fundraiser in the Last Year: Not on file  . Ran Out of Food in the Last Year: Not on file  Transportation Needs:   . Lack of Transportation (Medical): Not on file  . Lack of Transportation (Non-Medical): Not on file  Physical Activity:   . Days of Exercise per Week: Not on file  . Minutes of Exercise per Session: Not on file  Stress:   . Feeling of Stress : Not on file  Social Connections:   . Frequency of Communication with Friends and Family: Not on file  . Frequency of Social Gatherings with Friends and Family: Not on file  . Attends Religious Services: Not on file  . Active Member of Clubs or Organizations: Not on file  . Attends Archivist Meetings: Not on file  . Marital Status: Not on file  Intimate Partner Violence:   . Fear of Current or Ex-Partner: Not on file  . Emotionally Abused: Not on file  . Physically Abused: Not on file  . Sexually Abused: Not on file      Review of Systems  Constitutional: Negative for chills, diaphoresis and fatigue.  HENT: Negative for ear pain, postnasal drip and sinus pressure.   Eyes: Negative for photophobia, discharge, redness, itching and visual disturbance.  Respiratory: Negative for cough, shortness of breath and wheezing.   Cardiovascular: Negative for chest  pain, palpitations  and leg swelling.  Gastrointestinal: Negative for abdominal pain, constipation, diarrhea, nausea and vomiting.  Genitourinary: Negative for dysuria and flank pain.  Musculoskeletal: Negative for arthralgias, back pain, gait problem and neck pain.  Skin: Negative for color change.  Allergic/Immunologic: Negative for environmental allergies and food allergies.  Neurological: Negative for dizziness and headaches.  Hematological: Does not bruise/bleed easily.  Psychiatric/Behavioral: Negative for agitation, behavioral problems (depression) and hallucinations.    Vital Signs: BP 120/76   Pulse 85   Temp (!) 97.3 F (36.3 C)   Resp 16   Ht 5' 5"  (1.651 m)   Wt 184 lb 12.8 oz (83.8 kg)   SpO2 98%   BMI 30.75 kg/m    Physical Exam Constitutional:      General: She is not in acute distress.    Appearance: She is well-developed. She is not diaphoretic.  HENT:     Head: Normocephalic and atraumatic.     Mouth/Throat:     Pharynx: No oropharyngeal exudate.  Eyes:     Pupils: Pupils are equal, round, and reactive to light.  Neck:     Thyroid: No thyromegaly.     Vascular: No JVD.     Trachea: No tracheal deviation.  Cardiovascular:     Rate and Rhythm: Normal rate and regular rhythm.     Heart sounds: Normal heart sounds. No murmur heard.  No friction rub. No gallop.   Pulmonary:     Effort: Pulmonary effort is normal. No respiratory distress.     Breath sounds: No wheezing or rales.  Chest:     Chest wall: No tenderness.  Abdominal:     General: Bowel sounds are normal.     Palpations: Abdomen is soft.  Musculoskeletal:        General: Normal range of motion.     Cervical back: Normal range of motion and neck supple.  Lymphadenopathy:     Cervical: No cervical adenopathy.  Skin:    General: Skin is warm and dry.  Neurological:     Mental Status: She is alert and oriented to person, place, and time.     Cranial Nerves: No cranial nerve deficit.   Psychiatric:        Behavior: Behavior normal.        Thought Content: Thought content normal.        Judgment: Judgment normal.        Assessment/Plan: 1. OSA on CPAP Tolerance is good, CPAP 9 cm H2O, no hypoxia, continue to monitor   2. Mild intermittent asthma without complication Stable, not on any MDI, continue Singulair as before  - Spirometry with Graph  3. Anterior cervical lymphadenopathy ultrasound results are discussed, morphology is benign. Will continue to monitor along    General Counseling: sherilyn windhorst understanding of the findings of todays visit and agrees with plan of treatment. I have discussed any further diagnostic evaluation that may be needed or ordered today. We also reviewed her medications today. she has been encouraged to call the office with any questions or concerns that should arise related to todays visit.    Orders Placed This Encounter  Procedures  . Spirometry with Graph    Total time spent: 25 Minutes Time spent includes review of chart, medications, test results, and follow up plan with the patient.      Dr Lavera Guise Internal medicine

## 2020-10-03 ENCOUNTER — Encounter: Payer: Self-pay | Admitting: Rheumatology

## 2020-10-03 ENCOUNTER — Ambulatory Visit: Payer: Medicare HMO | Admitting: Rheumatology

## 2020-10-03 VITALS — BP 109/75 | HR 82 | Resp 15 | Ht 65.0 in | Wt 185.0 lb

## 2020-10-03 DIAGNOSIS — K900 Celiac disease: Secondary | ICD-10-CM | POA: Diagnosis not present

## 2020-10-03 DIAGNOSIS — Z8719 Personal history of other diseases of the digestive system: Secondary | ICD-10-CM

## 2020-10-03 DIAGNOSIS — M19071 Primary osteoarthritis, right ankle and foot: Secondary | ICD-10-CM

## 2020-10-03 DIAGNOSIS — M797 Fibromyalgia: Secondary | ICD-10-CM | POA: Diagnosis not present

## 2020-10-03 DIAGNOSIS — F411 Generalized anxiety disorder: Secondary | ICD-10-CM

## 2020-10-03 DIAGNOSIS — G894 Chronic pain syndrome: Secondary | ICD-10-CM | POA: Diagnosis not present

## 2020-10-03 DIAGNOSIS — F3341 Major depressive disorder, recurrent, in partial remission: Secondary | ICD-10-CM

## 2020-10-03 DIAGNOSIS — Z8709 Personal history of other diseases of the respiratory system: Secondary | ICD-10-CM

## 2020-10-03 DIAGNOSIS — M19041 Primary osteoarthritis, right hand: Secondary | ICD-10-CM | POA: Diagnosis not present

## 2020-10-03 DIAGNOSIS — J449 Chronic obstructive pulmonary disease, unspecified: Secondary | ICD-10-CM

## 2020-10-03 DIAGNOSIS — K224 Dyskinesia of esophagus: Secondary | ICD-10-CM | POA: Diagnosis not present

## 2020-10-03 DIAGNOSIS — Z13820 Encounter for screening for osteoporosis: Secondary | ICD-10-CM

## 2020-10-03 DIAGNOSIS — Z8261 Family history of arthritis: Secondary | ICD-10-CM

## 2020-10-03 DIAGNOSIS — Z85850 Personal history of malignant neoplasm of thyroid: Secondary | ICD-10-CM

## 2020-10-03 DIAGNOSIS — M170 Bilateral primary osteoarthritis of knee: Secondary | ICD-10-CM | POA: Diagnosis not present

## 2020-10-03 DIAGNOSIS — M19072 Primary osteoarthritis, left ankle and foot: Secondary | ICD-10-CM

## 2020-10-03 DIAGNOSIS — Z79899 Other long term (current) drug therapy: Secondary | ICD-10-CM

## 2020-10-03 DIAGNOSIS — M3509 Sicca syndrome with other organ involvement: Secondary | ICD-10-CM

## 2020-10-03 DIAGNOSIS — M19042 Primary osteoarthritis, left hand: Secondary | ICD-10-CM

## 2020-10-03 LAB — CBC WITH DIFFERENTIAL/PLATELET
Absolute Monocytes: 377 cells/uL (ref 200–950)
Basophils Absolute: 51 cells/uL (ref 0–200)
Basophils Relative: 1 %
Eosinophils Absolute: 168 cells/uL (ref 15–500)
Eosinophils Relative: 3.3 %
HCT: 38.1 % (ref 35.0–45.0)
Hemoglobin: 12.6 g/dL (ref 11.7–15.5)
Lymphs Abs: 1357 cells/uL (ref 850–3900)
MCH: 29.2 pg (ref 27.0–33.0)
MCHC: 33.1 g/dL (ref 32.0–36.0)
MCV: 88.4 fL (ref 80.0–100.0)
MPV: 9.5 fL (ref 7.5–12.5)
Monocytes Relative: 7.4 %
Neutro Abs: 3147 cells/uL (ref 1500–7800)
Neutrophils Relative %: 61.7 %
Platelets: 332 10*3/uL (ref 140–400)
RBC: 4.31 10*6/uL (ref 3.80–5.10)
RDW: 12.6 % (ref 11.0–15.0)
Total Lymphocyte: 26.6 %
WBC: 5.1 10*3/uL (ref 3.8–10.8)

## 2020-10-03 LAB — COMPLETE METABOLIC PANEL WITH GFR
AG Ratio: 1.5 (calc) (ref 1.0–2.5)
ALT: 19 U/L (ref 6–29)
AST: 16 U/L (ref 10–35)
Albumin: 4.3 g/dL (ref 3.6–5.1)
Alkaline phosphatase (APISO): 68 U/L (ref 37–153)
BUN: 17 mg/dL (ref 7–25)
CO2: 28 mmol/L (ref 20–32)
Calcium: 9.6 mg/dL (ref 8.6–10.4)
Chloride: 106 mmol/L (ref 98–110)
Creat: 0.86 mg/dL (ref 0.50–0.99)
GFR, Est African American: 82 mL/min/{1.73_m2} (ref >=60)
GFR, Est Non African American: 71 mL/min/{1.73_m2} (ref >=60)
Globulin: 2.8 g/dL (calc) (ref 1.9–3.7)
Glucose, Bld: 110 mg/dL — ABNORMAL HIGH (ref 65–99)
Potassium: 4.1 mmol/L (ref 3.5–5.3)
Sodium: 140 mmol/L (ref 135–146)
Total Bilirubin: 0.3 mg/dL (ref 0.2–1.2)
Total Protein: 7.1 g/dL (ref 6.1–8.1)

## 2020-10-03 MED ORDER — HYDROXYCHLOROQUINE SULFATE 200 MG PO TABS
200.0000 mg | ORAL_TABLET | Freq: Two times a day (BID) | ORAL | 0 refills | Status: DC
Start: 2020-10-03 — End: 2022-02-25

## 2020-10-03 NOTE — Patient Instructions (Signed)
Hand Exercises Hand exercises can be helpful for almost anyone. These exercises can strengthen the hands, improve flexibility and movement, and increase blood flow to the hands. These results can make work and daily tasks easier. Hand exercises can be especially helpful for people who have joint pain from arthritis or have nerve damage from overuse (carpal tunnel syndrome). These exercises can also help people who have injured a hand. Exercises Most of these hand exercises are gentle stretching and motion exercises. It is usually safe to do them often throughout the day. Warming up your hands before exercise may help to reduce stiffness. You can do this with gentle massage or by placing your hands in warm water for 10-15 minutes. It is normal to feel some stretching, pulling, tightness, or mild discomfort as you begin new exercises. This will gradually improve. Stop an exercise right away if you feel sudden, severe pain or your pain gets worse. Ask your health care provider which exercises are best for you. Knuckle bend or "claw" fist 1. Stand or sit with your arm, hand, and all five fingers pointed straight up. Make sure to keep your wrist straight during the exercise. 2. Gently bend your fingers down toward your palm until the tips of your fingers are touching the top of your palm. Keep your big knuckle straight and just bend the small knuckles in your fingers. 3. Hold this position for __________ seconds. 4. Straighten (extend) your fingers back to the starting position. Repeat this exercise 5-10 times with each hand. Full finger fist 1. Stand or sit with your arm, hand, and all five fingers pointed straight up. Make sure to keep your wrist straight during the exercise. 2. Gently bend your fingers into your palm until the tips of your fingers are touching the middle of your palm. 3. Hold this position for __________ seconds. 4. Extend your fingers back to the starting position, stretching every  joint fully. Repeat this exercise 5-10 times with each hand. Straight fist 1. Stand or sit with your arm, hand, and all five fingers pointed straight up. Make sure to keep your wrist straight during the exercise. 2. Gently bend your fingers at the big knuckle, where your fingers meet your hand, and the middle knuckle. Keep the knuckle at the tips of your fingers straight and try to touch the bottom of your palm. 3. Hold this position for __________ seconds. 4. Extend your fingers back to the starting position, stretching every joint fully. Repeat this exercise 5-10 times with each hand. Tabletop 1. Stand or sit with your arm, hand, and all five fingers pointed straight up. Make sure to keep your wrist straight during the exercise. 2. Gently bend your fingers at the big knuckle, where your fingers meet your hand, as far down as you can while keeping the small knuckles in your fingers straight. Think of forming a tabletop with your fingers. 3. Hold this position for __________ seconds. 4. Extend your fingers back to the starting position, stretching every joint fully. Repeat this exercise 5-10 times with each hand. Finger spread 1. Place your hand flat on a table with your palm facing down. Make sure your wrist stays straight as you do this exercise. 2. Spread your fingers and thumb apart from each other as far as you can until you feel a gentle stretch. Hold this position for __________ seconds. 3. Bring your fingers and thumb tight together again. Hold this position for __________ seconds. Repeat this exercise 5-10 times with each hand.  Making circles 1. Stand or sit with your arm, hand, and all five fingers pointed straight up. Make sure to keep your wrist straight during the exercise. 2. Make a circle by touching the tip of your thumb to the tip of your index finger. 3. Hold for __________ seconds. Then open your hand wide. 4. Repeat this motion with your thumb and each finger on your  hand. Repeat this exercise 5-10 times with each hand. Thumb motion 1. Sit with your forearm resting on a table and your wrist straight. Your thumb should be facing up toward the ceiling. Keep your fingers relaxed as you move your thumb. 2. Lift your thumb up as high as you can toward the ceiling. Hold for __________ seconds. 3. Bend your thumb across your palm as far as you can, reaching the tip of your thumb for the small finger (pinkie) side of your palm. Hold for __________ seconds. Repeat this exercise 5-10 times with each hand. Grip strengthening  1. Hold a stress ball or other soft ball in the middle of your hand. 2. Slowly increase the pressure, squeezing the ball as much as you can without causing pain. Think of bringing the tips of your fingers into the middle of your palm. All of your finger joints should bend when doing this exercise. 3. Hold your squeeze for __________ seconds, then relax. Repeat this exercise 5-10 times with each hand. Contact a health care provider if:  Your hand pain or discomfort gets much worse when you do an exercise.  Your hand pain or discomfort does not improve within 2 hours after you exercise. If you have any of these problems, stop doing these exercises right away. Do not do them again unless your health care provider says that you can. Get help right away if:  You develop sudden, severe hand pain or swelling. If this happens, stop doing these exercises right away. Do not do them again unless your health care provider says that you can. This information is not intended to replace advice given to you by your health care provider. Make sure you discuss any questions you have with your health care provider. Document Revised: 02/03/2019 Document Reviewed: 10/14/2018 Elsevier Patient Education  [place].

## 2020-10-04 ENCOUNTER — Encounter (INDEPENDENT_AMBULATORY_CARE_PROVIDER_SITE_OTHER): Payer: Self-pay | Admitting: Nurse Practitioner

## 2020-10-04 DIAGNOSIS — K581 Irritable bowel syndrome with constipation: Secondary | ICD-10-CM | POA: Diagnosis not present

## 2020-10-04 DIAGNOSIS — K2100 Gastro-esophageal reflux disease with esophagitis, without bleeding: Secondary | ICD-10-CM | POA: Diagnosis not present

## 2020-10-04 DIAGNOSIS — D369 Benign neoplasm, unspecified site: Secondary | ICD-10-CM | POA: Diagnosis not present

## 2020-10-04 NOTE — Progress Notes (Signed)
CBC and CMP normal.  Glucose is mildly elevated, probably not a fasting sample.

## 2020-10-05 ENCOUNTER — Other Ambulatory Visit: Payer: Self-pay | Admitting: Nurse Practitioner

## 2020-10-05 ENCOUNTER — Ambulatory Visit (INDEPENDENT_AMBULATORY_CARE_PROVIDER_SITE_OTHER): Payer: Medicare HMO | Admitting: Nurse Practitioner

## 2020-10-05 ENCOUNTER — Encounter: Payer: Self-pay | Admitting: Nurse Practitioner

## 2020-10-05 ENCOUNTER — Ambulatory Visit
Admission: RE | Admit: 2020-10-05 | Discharge: 2020-10-05 | Disposition: A | Discharge status: Home / Self Care | Payer: Medicare HMO | Source: Ambulatory Visit | Attending: Obstetrics & Gynecology | Admitting: Obstetrics & Gynecology

## 2020-10-05 ENCOUNTER — Other Ambulatory Visit: Payer: Self-pay

## 2020-10-05 VITALS — BP 119/74 | HR 80 | Temp 97.7°F | Resp 16 | Ht 65.0 in | Wt 184.2 lb

## 2020-10-05 DIAGNOSIS — M19041 Primary osteoarthritis, right hand: Secondary | ICD-10-CM

## 2020-10-05 DIAGNOSIS — E039 Hypothyroidism, unspecified: Secondary | ICD-10-CM

## 2020-10-05 DIAGNOSIS — J4520 Mild intermittent asthma, uncomplicated: Secondary | ICD-10-CM

## 2020-10-05 DIAGNOSIS — M19042 Primary osteoarthritis, left hand: Secondary | ICD-10-CM | POA: Diagnosis not present

## 2020-10-05 DIAGNOSIS — F418 Other specified anxiety disorders: Secondary | ICD-10-CM

## 2020-10-05 DIAGNOSIS — R69 Illness, unspecified: Secondary | ICD-10-CM | POA: Diagnosis not present

## 2020-10-05 DIAGNOSIS — Z13820 Encounter for screening for osteoporosis: Secondary | ICD-10-CM | POA: Diagnosis not present

## 2020-10-05 DIAGNOSIS — R591 Generalized enlarged lymph nodes: Secondary | ICD-10-CM

## 2020-10-05 DIAGNOSIS — Z1231 Encounter for screening mammogram for malignant neoplasm of breast: Secondary | ICD-10-CM | POA: Insufficient documentation

## 2020-10-05 DIAGNOSIS — Z23 Encounter for immunization: Secondary | ICD-10-CM | POA: Diagnosis not present

## 2020-10-05 MED ORDER — HYDROCODONE-ACETAMINOPHEN 7.5-325 MG PO TABS: 1.0000 | ORAL_TABLET | Freq: Four times a day (QID) | ORAL | 0 refills | Status: DC | PRN

## 2020-10-05 MED ORDER — LEVOTHYROXINE SODIUM 137 MCG PO TABS: 137.0000 ug | ORAL_TABLET | Freq: Every day | ORAL | 5 refills | Status: DC

## 2020-10-05 MED ORDER — PNEUMOCOCCAL 13-VAL CONJ VACC IM SUSP: 0.5000 mL | Freq: Once | INTRAMUSCULAR | 0 refills | Status: AC

## 2020-10-05 NOTE — Progress Notes (Signed)
[facility] [place], [place] [address]  Internal MEDICINE  Office Visit Note  Patient Name: Susan [name]  [id number]  [id number]  Date of Service: 11/04/2020  Chief Complaint  Patient presents with  . Follow-up    Will TSH get rechecked, it is getting knots on skin filled with oil and blood  . Depression  . Gastroesophageal Reflux  . Hyperlipidemia  . COPD  . Anxiety  . Asthma  . Quality Metric Gaps    Dexa, PNA    The patient is here for follow up.  -had soft tissue ultrasound of the neck.  --single right sided lymph node measuring 0.8X0.5X0.4 cm in diameter. It is smaller than previous check. --single left sided lymph node measuring 0.9X0.6X0.3cm in diameter. It is stable than her previous check.  --both lymph nodes are benign in appearance.  -due to have thyroid panel checked.  -due to have bone density -should have pneumonia vaccine. 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 This is generally well managed with current prescriptions and tylenol as needed. She sometimes needs to have something stronger. Will take hydrocodone/APAP 7.5/325mg when needed for severe pain. A prescription for #30 tablets will last her for six months or longer. This was verified per her PDMP profile. She needs to have a new prescription for this today.  -asthma is well controlled.       Current Medication: Outpatient Encounter Medications as of 10/05/2020  Medication Sig  . acetaminophen (TYLENOL) 500 MG tablet Take by mouth.   Marland Kitchen albuterol (PROVENTIL HFA;VENTOLIN HFA) 108 (90 Base) MCG/ACT inhaler Inhale 2 puffs into the lungs every 6 (six) hours as needed for wheezing or shortness of breath.  Marland Kitchen albuterol (PROVENTIL) (2.5 MG/3ML) 0.083% nebulizer solution Take 3 mLs (2.5 mg total) by nebulization every 6 (six) hours as needed for wheezing or shortness of breath.  . ALPRAZolam (XANAX) 0.5 MG tablet Take 1 tablet (0.5 mg total) by mouth 2 (two) times daily as needed for  anxiety or sleep.  Marland Kitchen amitriptyline (ELAVIL) 25 MG tablet Take 1 tablet (25 mg total) by mouth at bedtime.  . Ascorbic Acid (VITAMIN C) 100 MG tablet Take 100 mg by mouth daily.   . calcium carbonate (OS-CAL) 1250 (500 Ca) MG chewable tablet Chew by mouth.  . carisoprodol (SOMA) 350 MG tablet Take 350 mg by mouth at bedtime. Patient states that she takes 1/2 tablet nightly   . Dentifrices (BIOTENE DRY MOUTH DT) by Transmucosal route.  . doxycycline (VIBRAMYCIN) 50 MG capsule Take one cap po QD-BID. Take with food. (Patient taking differently: as needed. Take one cap po QD-BID. Take with food.)  . eletriptan (RELPAX) 40 MG tablet Take 40 mg by mouth as needed for migraine. may repeat in 2 hours if necessary   . ezetimibe (ZETIA) 10 MG tablet Take 1 tablet (10 mg total) by mouth daily.  . famotidine (PEPCID) 10 MG tablet Take 20 mg by mouth.   . fenofibrate (TRICOR) 145 MG tablet Take 1 tablet (145 mg total) by mouth daily.  . fluticasone (FLONASE) 50 MCG/ACT nasal spray Place 2 sprays into both nostrils daily.  Marland Kitchen gabapentin (NEURONTIN) 600 MG tablet Take 1/2 (one-half) tablet by mouth twice daily  . hydroxychloroquine (PLAQUENIL) 200 MG tablet Take 1 tablet (200 mg total) by mouth 2 (two) times daily.  . hyoscyamine (ANASPAZ) 0.125 MG TBDP disintergrating tablet Place 0.125 mg under the tongue every 4 (four) hours as needed for bladder spasms.   . Lactobacillus (DIGESTIVE HEALTH  PROBIOTIC PO) Take 1 tablet by mouth daily.   Marland Kitchen LINZESS 290 MCG CAPS capsule Take 290 mcg by mouth daily.   . Magnesium Oxide (NATRUL MAGNESIUM PO) Take by mouth.   . montelukast (SINGULAIR) 10 MG tablet TAKE ONE TABLET BY MOUTH ONCE DAILY FOR ASTHMA  . Multiple Vitamins-Minerals (ZINC PO) Take by mouth daily.  . mupirocin ointment (BACTROBAN) 2 % as needed.   . pantoprazole (PROTONIX) 40 MG tablet Take 40 mg by mouth daily.   Vladimir Faster Glycol-Propyl Glycol 0.4-0.3 % SOLN Apply to eye.   Marland Kitchen PRESCRIPTION MEDICATION 1  Syringe by Subdermal route once a week. Allergy shot   . Probiotic Product ([place]) CAPS Take by mouth.   . sodium chloride (OCEAN) 0.65 % SOLN nasal spray Place 1 spray into both nostrils as needed for congestion.   . sodium chloride 0.9 % nebulizer solution   . SUMAtriptan-naproxen (TREXIMET) 85-500 MG tablet daily as needed.   . Tuberculin-Allergy Syringes 28G X 1/2" 1 ML MISC 1 each by Does not apply route every 14 (fourteen) days. For allergy injections  . Vaginal Lubricant (REPLENS) GEL Place vaginally.   Derrill Memo ON 11/21/2020] venlafaxine XR (EFFEXOR-XR) 150 MG 24 hr capsule Take 2 capsules (300 mg total) by mouth at bedtime.  . Vitamin D, Ergocalciferol, (DRISDOL) 1.25 MG (50000 UNIT) CAPS capsule TAKE 1 CAPSULE (50,000 UNITS TOTAL) BY MOUTH EVERY 7 (SEVEN) DAYS. MONDAY  . vitamin E 1000 UNIT capsule Take by mouth.   Marland Kitchen White Petrolatum-Mineral Oil (Oriental PETROL-MINERAL OIL-LANOLIN) 0.1-0.1 % OINT   . [DISCONTINUED] CLIMARA 0.1 MG/24HR patch APPLY 1 PATCH TO SKIN ONCE WEEKLY  . [DISCONTINUED] EPINEPHRINE 0.3 mg/0.3 mL IJ SOAJ injection USE AS DIRECTED  . [DISCONTINUED] fluticasone furoate-vilanterol (BREO ELLIPTA) 100-25 MCG/INH AEPB Inhale 1 puff into the lungs daily.  . [DISCONTINUED] HYDROcodone-acetaminophen (NORCO) 7.5-325 MG tablet Take 1-2 tablets by mouth every 6 (six) hours as needed for moderate pain.  . [DISCONTINUED] levothyroxine (SYNTHROID) 137 MCG tablet Take 1 tablet (137 mcg total) by mouth daily before breakfast.  . HYDROcodone-acetaminophen (NORCO) 7.5-325 MG tablet Take 1-2 tablets by mouth every 6 (six) hours as needed for moderate pain.  Marland Kitchen levothyroxine (SYNTHROID) 137 MCG tablet Take 1 tablet (137 mcg total) by mouth daily before breakfast.  . [EXPIRED] pneumococcal 13-valent conjugate vaccine (PREVNAR 13) SUSP injection Inject 0.5 mLs into the muscle once for 1 dose.   No facility-administered encounter medications on file as of 10/05/2020.     Surgical History: Past Surgical History:  Procedure Laterality Date  . ABDOMINAL HYSTERECTOMY  2006  . BLADDER REPAIR    . BREAST CYST ASPIRATION Left yrs ago  . CHOLECYSTECTOMY  1989  . COLONOSCOPY WITH ESOPHAGOGASTRODUODENOSCOPY (EGD)    . COLONOSCOPY WITH PROPOFOL N/A 08/03/2017   Procedure: COLONOSCOPY WITH PROPOFOL;  Surgeon: Lollie Sails, MD;  Location: [facility] ENDOSCOPY;  Service: Endoscopy;  Laterality: N/A;  . COLONOSCOPY WITH PROPOFOL N/A 10/18/2018   Procedure: COLONOSCOPY WITH PROPOFOL;  Surgeon: Lollie Sails, MD;  Location[facility] ENDOSCOPY;  Service: Endoscopy;  Laterality: N/A;  . ESOPHAGOGASTRODUODENOSCOPY N/A 10/07/2013   Procedure: ESOPHAGOGASTRODUODENOSCOPY (EGD);  Surgeon: Danie Binder, MD;  Location: [facility] ENDO SUITE;  Service: Endoscopy;  Laterality: N/A;  patient received heparin at 530am given phenergan 16m IV 30 minutes before  . EYE SURGERY     lacrimal gland   . FLEXIBLE BRONCHOSCOPY N/A 06/04/2016   Procedure: FLEXIBLE BRONCHOSCOPY;  Surgeon: SAllyne Gee MD;  [facility];  Service: Pulmonary;  Laterality: N/A;  . INCONTINENCE SURGERY  2004  . NASAL SINUS SURGERY    . THYROID LOBECTOMY    . TOTAL THYROIDECTOMY      Medical History: Past Medical History:  Diagnosis Date  . Arthritis   . Asthma   . Asthmatic bronchitis   . BRCA negative 05/2014   MyRisk neg  . Celiac disease   . Celiac sprue   . Chronic anxiety   . Complication of anesthesia    vomiting  . COPD (chronic obstructive pulmonary disease) ([place])   . Depression   . Environmental allergies   . Family history of breast cancer 05/2014   MyRisk neg; IBIS=29.5%  . Fibromyalgia    neuropathy all over  . Gastritis 10/08/2013  . GERD (gastroesophageal reflux disease)   . Hyperlipemia   . Hyperlipidemia    Intolerant to statins  . Hypothyroidism (acquired)   . Increased risk of breast cancer 05/2014   IBIS=29.5%  . Increased risk of breast cancer 05/2014   IBIS=29.5%   . Migraine   . Migraines   . 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 Pancreatitis   . Sjogren's disease ([place])   . Spastic colon   . Thyroid cancer ([place]) 1990 and 1994   Total thyroidectomy with radioactive iodine.     Family History: Family History  Problem Relation Age of Onset  . Arthritis/Rheumatoid Mother        died in age 102s  . Breast cancer Mother 81  . Breast cancer Maternal Grandmother        40's  . Breast cancer Maternal Aunt        X 2. 50's  . Lung cancer Maternal Aunt   . Pancreatitis Neg Hx   . Colon cancer Neg Hx     Social History   Socioeconomic History  . Marital status: Married    Spouse name: Not on file  . Number of children: 0  . Years of education: Not on file  . Highest education level: Not on file  Occupational History  . Not on file  Tobacco Use  . Smoking status: Never Smoker  . Smokeless tobacco: Never Used  Vaping Use  . Vaping Use: Never used  Substance and Sexual Activity  . Alcohol use: No  . Drug use: No  . Sexual activity: Yes    Birth control/protection: Surgical  Other Topics Concern  . Not on file  Social History Narrative  . Not on file   Social Determinants of Health   Financial Resource Strain: Not on file  Food Insecurity: Not on file  Transportation Needs: Not on file  Physical Activity: Not on file  Stress: Not on file  Social Connections: Not on file  Intimate Partner Violence: Not on file      Review of Systems  Constitutional: Positive for fatigue. Negative for activity change, chills and unexpected weight change.  HENT: Negative for congestion, postnasal drip, rhinorrhea, sneezing and sore throat.   Respiratory: Negative for cough, chest tightness, shortness of breath and wheezing.        Well managed asthma  Cardiovascular: Negative for chest pain and palpitations.  Gastrointestinal: Negative for abdominal pain, constipation, diarrhea, nausea and vomiting.  Endocrine: Negative for cold intolerance,  heat intolerance, polydipsia and polyuria.       De to have thyroid panel checked   Musculoskeletal: Negative for arthralgias, back pain, joint swelling and neck pain.       Generalized joint  pain with arthritic changes noted.   Skin: Negative for rash.  Allergic/Immunologic: Positive for environmental allergies.  Neurological: Negative for dizziness, tremors, numbness and headaches.  Hematological: Negative for adenopathy. Does not bruise/bleed easily.  Psychiatric/Behavioral: Negative for behavioral problems (Depression), sleep disturbance and suicidal ideas. The patient is not nervous/anxious.     Today's Vitals   10/05/20 1130  BP: 119/74  Pulse: 80  Resp: 16  Temp: 97.7 F (36.5 C)  SpO2: 98%  Weight: 184 lb 3.2 oz (83.6 kg)  Height: 5' 5"  (1.651 m)   Body mass index is 30.65 kg/m.  Physical Exam Vitals and nursing note reviewed.  Constitutional:      General: She is not in acute distress.    Appearance: Normal appearance. She is well-developed and well-nourished. She is not diaphoretic.  HENT:     Head: Normocephalic and atraumatic.     Mouth/Throat:     Mouth: Oropharynx is clear and moist.     Pharynx: No oropharyngeal exudate.  Eyes:     Extraocular Movements: EOM normal.     Pupils: Pupils are equal, round, and reactive to light.  Neck:     Thyroid: No thyromegaly.     Vascular: No carotid bruit or JVD.     Trachea: No tracheal deviation.  Cardiovascular:     Rate and Rhythm: Normal rate and regular rhythm.     Heart sounds: Normal heart sounds. No murmur heard. No friction rub. No gallop.   Pulmonary:     Effort: Pulmonary effort is normal. No respiratory distress.     Breath sounds: Normal breath sounds. No wheezing or rales.  Chest:     Chest wall: No tenderness.  Abdominal:     General: Bowel sounds are normal.     Palpations: Abdomen is soft.     Tenderness: There is no abdominal tenderness.  Musculoskeletal:        General: Normal range of  motion.     Cervical back: Normal range of motion and neck supple.     Comments: She has generalized joint tenderness with arthritic changes present.   Lymphadenopathy:     Cervical: No cervical adenopathy.  Skin:    General: Skin is warm and dry.     Capillary Refill: Capillary refill takes less than 2 seconds.  Neurological:     Mental Status: She is alert and oriented to person, place, and time.     Cranial Nerves: No cranial nerve deficit.  Psychiatric:        Mood and Affect: Mood and affect normal.        Behavior: Behavior normal.        Thought Content: Thought content normal.        Judgment: Judgment normal.    Assessment/Plan: 1. Lymphadenopathy of head and neck Reviewed results of soft tissue ultrasound with the patient.  --single right sided lymph node measuring 0.8X0.5X0.4 cm in diameter. It is smaller than previous check. --single left sided lymph node measuring 0.9X0.6X0.3cm in diameter. It is stable than her previous check.  --both lymph nodes are benign in appearance.  -will repeat in 1 year for surveillance.   2. Mild intermittent chronic asthma without complication Well managed. Continue all inhalers and respiratory medication as prescribed   3. Acquired hypothyroidism Check thyroid panel and adjust dose of levothyroxine as indicated.  - levothyroxine (SYNTHROID) 137 MCG tablet; Take 1 tablet (137 mcg total) by mouth daily before breakfast.  Dispense: 30 tablet; Refill: 5  4. Primary osteoarthritis of both hands Continue all preventive medication as prescribed. May take hydrocodone/APAP 7.5/325mg when needed for severe pain. A single prescription for #30 tablets sent to her pharmacy today. - HYDROcodone-acetaminophen ([place]) 7.5-325 MG tablet; Take 1-2 tablets by mouth every 6 (six) hours as needed for moderate pain.  Dispense: 30 tablet; Refill: 0  5. Depression with anxiety Stable. Continue medication as prescribed   6. Screening for osteoporosis Bone  density ordered - DG Bone Density; Future  7. Need for vaccination against Streptococcus pneumoniae using pneumococcal conjugate vaccine 13 Prescription for prevnar 13 sent to her pharmacy for administration. - pneumococcal 13-valent conjugate vaccine (PREVNAR 13) SUSP injection; Inject 0.5 mLs into the muscle once for 1 dose.  Dispense: 0.5 mL; Refill: 0  General Counseling: Karisma verbalizes understanding of the findings of todays visit and agrees with plan of treatment. I have discussed any further diagnostic evaluation that may be needed or ordered today. We also reviewed her medications today. she has been encouraged to call the office with any questions or concerns that should arise related to todays visit.  This patient was seen by Leretha Pol FNP Collaboration with Dr Lavera Guise as a part of collaborative care agreement  Orders Placed This Encounter  Procedures  . DG Bone Density    Meds ordered this encounter  Medications  . pneumococcal 13-valent conjugate vaccine (PREVNAR 13) SUSP injection    Sig: Inject 0.5 mLs into the muscle once for 1 dose.    Dispense:  0.5 mL    Refill:  0    Order Specific Question:   Supervising Provider    Answer:   Lavera Guise [[id number]]  . HYDROcodone-acetaminophen (NORCO) 7.5-325 MG tablet    Sig: Take 1-2 tablets by mouth every 6 (six) hours as needed for moderate pain.    Dispense:  30 tablet    Refill:  0    Order Specific Question:   Supervising Provider    Answer:   Lavera Guise [[id number]]  . levothyroxine (SYNTHROID) 137 MCG tablet    Sig: Take 1 tablet (137 mcg total) by mouth daily before breakfast.    Dispense:  30 tablet    Refill:  5    Order Specific Question:   Supervising Provider    Answer:   Lavera Guise [[id number]]    Total time spent: 30 Minutes   Time spent includes review of chart, medications, test results, and follow up plan with the patient.      Dr Lavera Guise Internal medicine

## 2020-10-06 LAB — CBC
Hematocrit: 40.8 % (ref 34.0–46.6)
Hemoglobin: 13.6 g/dL (ref 11.1–15.9)
MCH: 29.2 pg (ref 26.6–33.0)
MCHC: 33.3 g/dL (ref 31.5–35.7)
MCV: 88 fL (ref 79–97)
Platelets: 359 10*3/uL (ref 150–450)
RBC: 4.65 x10E6/uL (ref 3.77–5.28)
RDW: 12.9 % (ref 11.7–15.4)
WBC: 5.5 10*3/uL (ref 3.4–10.8)

## 2020-10-06 LAB — TSH: TSH: 0.122 u[IU]/mL — ABNORMAL LOW (ref 0.450–4.500)

## 2020-10-06 LAB — T4, FREE: Free T4: 1.54 ng/dL (ref 0.82–1.77)

## 2020-10-06 NOTE — Progress Notes (Signed)
Subjective:    Patient ID: Susan [name], female    DOB: 03-26-1956, 64 y.o.   MRN: [id number] Chief Complaint  Patient presents with  . Follow-up    39moU/S follow up    Patient returns to the office today for follow-up vascular studies related to lower extremity pain.  Patient also notes some stinging and throbbing over her varicosities.  The patient also notes that she has extensive pain when standing in 1 place or walking.  She notes that her legs began to throb worse during this time.  Both legs hurt and this is consistent.  She notes that the right is worse than the left.  This is been going on for approximately a year or so.  There is no history of DVT, PE or superficial thrombophlebitis. There is no history of ulceration or hemorrhage. The patient denies a significant family history of varicose veins.   The patient has graduated compression in the past however not on a consistent basis.  Since last visit patient has worn more however not consistently.  She has noted that the compression socks have helped somewhat.  At the present time the patient has not been using over-the-counter analgesics. There is no history of prior surgical intervention or sclerotherapy.  Previous noninvasive studies show no evidence of DVT or superficial venous stenosis bilaterally.  The bilateral lower extremities have no evidence of deep venous insufficiency.  Left lower extremity has no evidence of superficial venous reflux in either the great or short saphenous vein.  The right lower extremity has evidence of venous reflux in the great saphenous vein at the proximal thigh extending to the mid thigh.  The vein diameters range from 0.35 cm to 0.38.  Today, ABIs reveal a right ABI of 1.32 and a left of 1.27.  A right ABI of 1.00 with a left of 1.23.  The patient has triphasic tibial artery waveforms bilaterally with good toe waveforms bilaterally.   Review of Systems  Musculoskeletal: Positive for  arthralgias and joint swelling.  All other systems reviewed and are negative.      Objective:   Physical Exam Vitals reviewed.  HENT:     Head: Normocephalic.  Cardiovascular:     Rate and Rhythm: Normal rate.     Pulses: Normal pulses.  Pulmonary:     Effort: Pulmonary effort is normal.  Musculoskeletal:     Right lower leg: No edema.     Left lower leg: No edema.  Neurological:     Mental Status: She is alert and oriented to person, place, and time.  Psychiatric:        Mood and Affect: Mood normal.        Behavior: Behavior normal.        Thought Content: Thought content normal.        Judgment: Judgment normal.     BP 125/80   Pulse 79   Ht 5' 5"  (1.651 m)   Wt 184 lb (83.5 kg)   BMI 30.62 kg/m   Past Medical History:  Diagnosis Date  . Arthritis   . Asthma   . Asthmatic bronchitis   . BRCA negative 05/2014   MyRisk neg  . Celiac disease   . Celiac sprue   . Chronic anxiety   . Complication of anesthesia    vomiting  . COPD (chronic obstructive pulmonary disease) ([place]   . Depression   . Environmental allergies   . Family history of breast cancer 05/2014  MyRisk neg; IBIS=29.5%  . Fibromyalgia    neuropathy all over  . Gastritis 10/08/2013  . GERD (gastroesophageal reflux disease)   . Hyperlipemia   . Hyperlipidemia    Intolerant to statins  . Hypothyroidism (acquired)   . Increased risk of breast cancer 05/2014   IBIS=29.5%  . Increased risk of breast cancer 05/2014   IBIS=29.5%  . Migraine   . Migraines   . 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 Pancreatitis   . Sjogren's disease ([place])   . Spastic colon   . Thyroid cancer ([place]) 1990 and 1994   Total thyroidectomy with radioactive iodine.     Social History   Socioeconomic History  . Marital status: Married    Spouse name: Not on file  . Number of children: 0  . Years of education: Not on file  . Highest education level: Not on file  Occupational History  . Not on file  Tobacco Use   . Smoking status: Never Smoker  . Smokeless tobacco: Never Used  Vaping Use  . Vaping Use: Never used  Substance and Sexual Activity  . Alcohol use: No  . Drug use: No  . Sexual activity: Yes    Birth control/protection: Surgical  Other Topics Concern  . Not on file  Social History Narrative  . Not on file   Social Determinants of Health   Financial Resource Strain: Not on file  Food Insecurity: Not on file  Transportation Needs: Not on file  Physical Activity: Not on file  Stress: Not on file  Social Connections: Not on file  Intimate Partner Violence: Not on file    Past Surgical History:  Procedure Laterality Date  . ABDOMINAL HYSTERECTOMY  2006  . BLADDER REPAIR    . BREAST CYST ASPIRATION Left yrs ago  . CHOLECYSTECTOMY  1989  . COLONOSCOPY WITH ESOPHAGOGASTRODUODENOSCOPY (EGD)    . COLONOSCOPY WITH PROPOFOL N/A 08/03/2017   Procedure: COLONOSCOPY WITH PROPOFOL;  Surgeon: Lollie Sails, MD;  Location: [facility] ENDOSCOPY;  Service: Endoscopy;  Laterality: N/A;  . COLONOSCOPY WITH PROPOFOL N/A 10/18/2018   Procedure: COLONOSCOPY WITH PROPOFOL;  Surgeon: Lollie Sails, MD;  Location[facility] ENDOSCOPY;  Service: Endoscopy;  Laterality: N/A;  . ESOPHAGOGASTRODUODENOSCOPY N/A 10/07/2013   Procedure: ESOPHAGOGASTRODUODENOSCOPY (EGD);  Surgeon: Danie Binder, MD;  Location: [facility] ENDO SUITE;  Service: Endoscopy;  Laterality: N/A;  patient received heparin at 530am given phenergan 36m IV 30 minutes before  . EYE SURGERY     lacrimal gland   . FLEXIBLE BRONCHOSCOPY N/A 06/04/2016   Procedure: FLEXIBLE BRONCHOSCOPY;  Surgeon: SAllyne Gee MD;  Location: [facility] ORS;  Service: Pulmonary;  Laterality: N/A;  . INCONTINENCE SURGERY  2004  . NASAL SINUS SURGERY    . THYROID LOBECTOMY    . TOTAL THYROIDECTOMY      Family History  Problem Relation Age of Onset  . Arthritis/Rheumatoid Mother        died in age 9336s . Breast cancer Mother 431 . Breast cancer Maternal  Grandmother        40's  . Breast cancer Maternal Aunt        X 2. 50's  . Lung cancer Maternal Aunt   . Pancreatitis Neg Hx   . Colon cancer Neg Hx     Allergies  Allergen Reactions  . Aspirin   . Keflex [Cephalexin] Shortness Of Breath  . Azithromycin Hives and Nausea Only  . Barley Grass   . Clarithromycin Other (See  Comments)  . Metoclopramide Other (See Comments)    TREMORS Tremors Tremors   . Oat Other (See Comments)  . Oatmeal Other (See Comments)  . Other     Other reaction(s): Other (See Comments) KEOPLEX.  Marland Kitchen Rye Grass Flower Pollen Extract [Gramineae Pollens]   . Vioxx [Rofecoxib] Diarrhea  . Wheat Bran Other (See Comments)    Celiac disease  . Wheat Extract   . Celebrex [Celecoxib] Hives  . Metoclopramide Hcl Anxiety    BODY TREMORS    CBC Latest Ref Rng & Units 10/03/2020 08/22/2020 03/23/2020  WBC 3.8 - 10.8 Thousand/uL 5.1 5.2 4.7  Hemoglobin 11.7 - 15.5 g/dL 12.6 12.6 12.4  Hematocrit 35.0 - 45.0 % 38.1 36.9 38.9  Platelets 140 - 400 Thousand/uL 332 363 326      CMP     Component Value Date/Time   NA 140 10/03/2020 1059   NA 140 08/22/2020 1341   K 4.1 10/03/2020 1059   CL 106 10/03/2020 1059   CO2 28 10/03/2020 1059   GLUCOSE 110 (H) 10/03/2020 1059   BUN 17 10/03/2020 1059   BUN 12 08/22/2020 1341   CREATININE 0.86 10/03/2020 1059   CALCIUM 9.6 10/03/2020 1059   PROT 7.1 10/03/2020 1059   PROT 7.2 08/22/2020 1341   ALBUMIN 4.4 08/22/2020 1341   AST 16 10/03/2020 1059   ALT 19 10/03/2020 1059   ALKPHOS 79 08/22/2020 1341   BILITOT 0.3 10/03/2020 1059   BILITOT <0.2 08/22/2020 1341   GFRNONAA 71 10/03/2020 1059   GFRAA 82 10/03/2020 1059     VAS [place] ABI WITH/WO TBI  Result Date: 10/05/2020 LOWER EXTREMITY DOPPLER STUDY Indications: Rest pain.  Performing Technologist: Almira Coaster RVS  Examination Guidelines: A complete evaluation includes at minimum, Doppler waveform signals and systolic blood pressure reading at the level of  bilateral brachial, anterior tibial, and posterior tibial arteries, when vessel segments are accessible. Bilateral testing is considered an integral part of a complete examination. Photoelectric Plethysmograph (PPG) waveforms and toe systolic pressure readings are included as required and additional duplex testing as needed. Limited examinations for reoccurring indications may be performed as noted.  ABI Findings: +---------+------------------+-----+--------+--------+ Right    Rt Pressure (mmHg)IndexWaveformComment  +---------+------------------+-----+--------+--------+ Brachial 117                                     +---------+------------------+-----+--------+--------+ ATA      162               1.27                  +---------+------------------+-----+--------+--------+ PTA      169               1.32                  +---------+------------------+-----+--------+--------+ Great Toe128               1.00                  +---------+------------------+-----+--------+--------+ +---------+------------------+-----+--------+-------+ Left     Lt Pressure (mmHg)IndexWaveformComment +---------+------------------+-----+--------+-------+ Brachial 128                                    +---------+------------------+-----+--------+-------+ ATA      163  1.27                 +---------+------------------+-----+--------+-------+ PTA      163               1.27                 +---------+------------------+-----+--------+-------+ Great Toe158               1.23                 +---------+------------------+-----+--------+-------+ +-------+-----------+-----------+------------+------------+ ABI/TBIToday's ABIToday's TBIPrevious ABIPrevious TBI +-------+-----------+-----------+------------+------------+ Right  1.32       1.0                                 +-------+-----------+-----------+------------+------------+ Left   1.27       1.23                                 +-------+-----------+-----------+------------+------------+  Summary: Right: Resting right ankle-brachial index is within normal range. No evidence of significant right lower extremity arterial disease. The right toe-brachial index is normal. Left: Resting left ankle-brachial index is within normal range. No evidence of significant left lower extremity arterial disease. The left toe-brachial index is normal.  *See table(s) above for measurements and observations.  Electronically signed by Leotis Pain MD on 10/05/2020 at 10:58:57 AM.   Final        Assessment & Plan:   1. Pain in both lower extremities Pain in her lower extremities is not related to arterial issues.  The patient may have issues with her lower back.  There may also be issues with her varicosities causing discomfort.  The patient will contact her office in April when she returns from [place] for follow-up visit to determine her pain as outlined below.  2. Varicose veins of bilateral lower extremities with pain The patient has been wearing medical grade 1 compression stockings and notes that her lower extremity pain has helped somewhat.  However she is not using them consistently.  We will not proceed with any venous intervention at this time to allow the patient to wear her compression socks on a more regular basis to see if this does help her pain.  Is also possible that some of her pain is related to lower back issues and arthritis.  Patient will contact her office in April for follow-up visit to discuss further steps.  3. Primary osteoarthritis of both first carpometacarpal joints Continue NSAID medications as already ordered, these medications have been reviewed and there are no changes at this time.  Continued activity and therapy was stressed.    Current Outpatient Medications on File Prior to Visit  Medication Sig Dispense Refill  . acetaminophen (TYLENOL) 500 MG tablet Take by mouth.     Marland Kitchen  albuterol (PROVENTIL HFA;VENTOLIN HFA) 108 (90 Base) MCG/ACT inhaler Inhale 2 puffs into the lungs every 6 (six) hours as needed for wheezing or shortness of breath. 1 Inhaler 2  . albuterol (PROVENTIL) (2.5 MG/3ML) 0.083% nebulizer solution Take 3 mLs (2.5 mg total) by nebulization every 6 (six) hours as needed for wheezing or shortness of breath. 75 mL 6  . ALPRAZolam (XANAX) 0.5 MG tablet Take 1 tablet (0.5 mg total) by mouth 2 (two) times daily as needed for anxiety or sleep. 60 tablet 2  . amitriptyline (ELAVIL) 25 MG tablet  Take 1 tablet (25 mg total) by mouth at bedtime. 90 tablet 1  . Ascorbic Acid (VITAMIN C) 100 MG tablet Take 100 mg by mouth daily.     . calcium carbonate (OS-CAL) 1250 (500 Ca) MG chewable tablet Chew by mouth.    . carisoprodol (SOMA) 350 MG tablet Take 350 mg by mouth at bedtime. Patient states that she takes 1/2 tablet nightly     . CLIMARA 0.1 MG/24HR patch APPLY 1 PATCH TO SKIN ONCE WEEKLY 12 patch 4  . Dentifrices (BIOTENE DRY MOUTH DT) by Transmucosal route.    . doxycycline (VIBRAMYCIN) 50 MG capsule Take one cap po QD-BID. Take with food. (Patient taking differently: as needed. Take one cap po QD-BID. Take with food.) 60 capsule 0  . eletriptan (RELPAX) 40 MG tablet Take 40 mg by mouth as needed for migraine. may repeat in 2 hours if necessary     . EPINEPHRINE 0.3 mg/0.3 mL IJ SOAJ injection USE AS DIRECTED 1 Device 2  . ezetimibe (ZETIA) 10 MG tablet Take 1 tablet (10 mg total) by mouth daily. 90 tablet 1  . famotidine (PEPCID) 10 MG tablet Take 20 mg by mouth.     . fenofibrate (TRICOR) 145 MG tablet Take 1 tablet (145 mg total) by mouth daily. 90 tablet 1  . fluticasone (FLONASE) 50 MCG/ACT nasal spray Place 2 sprays into both nostrils daily. 16 g 3  . fluticasone furoate-vilanterol (BREO ELLIPTA) 100-25 MCG/INH AEPB Inhale 1 puff into the lungs daily. 60 each 2  . gabapentin (NEURONTIN) 600 MG tablet Take 1/2 (one-half) tablet by mouth twice daily 45 tablet  3  . hyoscyamine (ANASPAZ) 0.125 MG TBDP disintergrating tablet Place 0.125 mg under the tongue every 4 (four) hours as needed for bladder spasms.     . Lactobacillus (DIGESTIVE HEALTH PROBIOTIC PO) Take 1 tablet by mouth daily.     Marland Kitchen LINZESS 290 MCG CAPS capsule Take 290 mcg by mouth daily.     . Magnesium Oxide (NATRUL MAGNESIUM PO) Take by mouth.     . montelukast (SINGULAIR) 10 MG tablet TAKE ONE TABLET BY MOUTH ONCE DAILY FOR ASTHMA 90 tablet 3  . mupirocin ointment (BACTROBAN) 2 % as needed.   0  . pantoprazole (PROTONIX) 40 MG tablet Take 40 mg by mouth daily.     Vladimir Faster Glycol-Propyl Glycol 0.4-0.3 % SOLN Apply to eye.     Marland Kitchen PRESCRIPTION MEDICATION 1 Syringe by Subdermal route once a week. Allergy shot     . Probiotic Product ([place]) CAPS Take by mouth.     . sodium chloride (OCEAN) 0.65 % SOLN nasal spray Place 1 spray into both nostrils as needed for congestion.     . sodium chloride 0.9 % nebulizer solution     . SUMAtriptan-naproxen (TREXIMET) 85-500 MG tablet daily as needed.     . Tuberculin-Allergy Syringes 28G X 1/2" 1 ML MISC 1 each by Does not apply route every 14 (fourteen) days. For allergy injections 12 each 3  . Vaginal Lubricant (REPLENS) GEL Place vaginally.     Derrill Memo ON 11/21/2020] venlafaxine XR (EFFEXOR-XR) 150 MG 24 hr capsule Take 2 capsules (300 mg total) by mouth at bedtime. 180 capsule 1  . Vitamin D, Ergocalciferol, (DRISDOL) 1.25 MG (50000 UNIT) CAPS capsule TAKE 1 CAPSULE (50,000 UNITS TOTAL) BY MOUTH EVERY 7 (SEVEN) DAYS. MONDAY 12 capsule 1  . vitamin E 1000 UNIT capsule Take by mouth.     Dema Severin  Petrolatum-Mineral Oil ([place] PETROL-MINERAL OIL-LANOLIN) 0.1-0.1 % OINT      No current facility-administered medications on file prior to visit.    There are no Patient Instructions on file for this visit. No follow-ups on file.   Kris Hartmann, NP

## 2020-10-10 DIAGNOSIS — R69 Illness, unspecified: Secondary | ICD-10-CM | POA: Diagnosis not present

## 2020-10-12 DIAGNOSIS — G4733 Obstructive sleep apnea (adult) (pediatric): Secondary | ICD-10-CM | POA: Diagnosis not present

## 2020-10-16 ENCOUNTER — Other Ambulatory Visit (HOSPITAL_COMMUNITY)
Admission: RE | Admit: 2020-10-16 | Discharge: 2020-10-16 | Disposition: A | Discharge status: Home / Self Care | Payer: Medicare HMO | Source: Ambulatory Visit | Attending: Obstetrics & Gynecology | Admitting: Obstetrics & Gynecology

## 2020-10-16 ENCOUNTER — Ambulatory Visit (INDEPENDENT_AMBULATORY_CARE_PROVIDER_SITE_OTHER): Payer: Medicare HMO | Admitting: Obstetrics & Gynecology

## 2020-10-16 ENCOUNTER — Other Ambulatory Visit: Payer: Self-pay

## 2020-10-16 ENCOUNTER — Encounter: Payer: Self-pay | Admitting: Obstetrics & Gynecology

## 2020-10-16 VITALS — BP 120/80 | Ht 65.0 in | Wt 187.0 lb

## 2020-10-16 DIAGNOSIS — Z8742 Personal history of other diseases of the female genital tract: Secondary | ICD-10-CM | POA: Insufficient documentation

## 2020-10-16 DIAGNOSIS — R87620 Atypical squamous cells of undetermined significance on cytologic smear of vagina (ASC-US): Secondary | ICD-10-CM | POA: Diagnosis not present

## 2020-10-16 DIAGNOSIS — Z1211 Encounter for screening for malignant neoplasm of colon: Secondary | ICD-10-CM

## 2020-10-16 DIAGNOSIS — Z780 Asymptomatic menopausal state: Secondary | ICD-10-CM | POA: Diagnosis not present

## 2020-10-16 DIAGNOSIS — Z01419 Encounter for gynecological examination (general) (routine) without abnormal findings: Secondary | ICD-10-CM | POA: Insufficient documentation

## 2020-10-16 DIAGNOSIS — R87622 Low grade squamous intraepithelial lesion on cytologic smear of vagina (LGSIL): Secondary | ICD-10-CM | POA: Insufficient documentation

## 2020-10-16 DIAGNOSIS — R87610 Atypical squamous cells of undetermined significance on cytologic smear of cervix (ASC-US): Secondary | ICD-10-CM | POA: Diagnosis not present

## 2020-10-16 MED ORDER — ESTRADIOL 0.1 MG/24HR TD PTWK: MEDICATED_PATCH | TRANSDERMAL | 4 refills | Status: DC

## 2020-10-16 NOTE — Patient Instructions (Signed)
PAP every three years Mammogram every year    Call [phone] to schedule at [place] Colonoscopy every 10 years Labs yearly (with PCP)  Thank you for choosing [facility]. As part of our ongoing efforts to improve patient experience, we would appreciate your feedback. Please fill out the short survey that you will receive by mail or MyChart. Your opinion is important to us! - Dr. Zarif Rathje  Recommendations to boost your immunity to prevent illness such as viral flu and colds, including covid19, are as follows:       - - -  Vitamin K2 and Vitamin D3  - - - Take Vitamin K2 at 200-300 mcg daily (usually 2-3 pills daily of the over the counter formulation). Take Vitamin D3 at 3000-4000 U daily (usually 3-4 pills daily of the over the counter formulation). Studies show that these two at high normal levels in your system are very effective in keeping your immunity so strong and protective that you will be unlikely to contract viral illness such as those listed above.  Dr Maryama Kuriakose  

## 2020-10-16 NOTE — Progress Notes (Signed)
HPI:      [name] is a 64 y.o. G0P0000 who LMP was in the past, she presents today for her annual examination.  The patient has no complaints today; just occas hot flash, leakage of urine (prior sling) but not worsening. The patient is sexually active. Herlast pap: approximate date 2020 and was normal and prior h/o LGSIL (vag PAP); and last mammogram: approximate date 2020 and was normal.  The patient does perform self breast exams.  There is notable family history of breast or ovarian cancer in her family. Pt is BRCA NEG. The patient is taking hormone replacement therapy. Patient denies post-menopausal vaginal bleeding.   The patient has regular exercise: yes. The patient denies current symptoms of depression.    GYN Hx: Last Colonoscopy:2 years ago. Normal.   PMHx: Past Medical History:  Diagnosis Date  . Arthritis   . Asthma   . Asthmatic bronchitis   . BRCA negative 05/2014   MyRisk neg  . Celiac disease   . Celiac sprue   . Chronic anxiety   . Complication of anesthesia    vomiting  . COPD (chronic obstructive pulmonary disease) ([place])   . Depression   . Environmental allergies   . Family history of breast cancer 05/2014   MyRisk neg; IBIS=29.5%  . Fibromyalgia    neuropathy all over  . Gastritis 10/08/2013  . GERD (gastroesophageal reflux disease)   . Hyperlipemia   . Hyperlipidemia    Intolerant to statins  . Hypothyroidism (acquired)   . Increased risk of breast cancer 05/2014   IBIS=29.5%  . Increased risk of breast cancer 05/2014   IBIS=29.5%  . Migraine   . Migraines   . 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 Pancreatitis   . Sjogren's disease ([place])   . Spastic colon   . Thyroid cancer ([place]) 1990 and 1994   Total thyroidectomy with radioactive iodine.    Past Surgical History:  Procedure Laterality Date  . ABDOMINAL HYSTERECTOMY  2006  . BLADDER REPAIR    . BREAST CYST ASPIRATION Left yrs ago  . CHOLECYSTECTOMY  1989  . COLONOSCOPY WITH  ESOPHAGOGASTRODUODENOSCOPY (EGD)    . COLONOSCOPY WITH PROPOFOL N/A 08/03/2017   Procedure: COLONOSCOPY WITH PROPOFOL;  Surgeon: Lollie Sails, MD;  Location[facility] ENDOSCOPY;  Service: Endoscopy;  Laterality: N/A;  . COLONOSCOPY WITH PROPOFOL N/A 10/18/2018   Procedure: COLONOSCOPY WITH PROPOFOL;  Surgeon: Lollie Sails, MD;  Location[facility] ENDOSCOPY;  Service: Endoscopy;  Laterality: N/A;  . ESOPHAGOGASTRODUODENOSCOPY N/A 10/07/2013   Procedure: ESOPHAGOGASTRODUODENOSCOPY (EGD);  Surgeon: Danie Binder, MD;  Location: [facility] ENDO SUITE;  Service: Endoscopy;  Laterality: N/A;  patient received heparin at 530am given phenergan 83m IV 30 minutes before  . EYE SURGERY     lacrimal gland   . FLEXIBLE BRONCHOSCOPY N/A 06/04/2016   Procedure: FLEXIBLE BRONCHOSCOPY;  Surgeon: SAllyne Gee MD;  Location: [facility] ORS;  Service: Pulmonary;  Laterality: N/A;  . INCONTINENCE SURGERY  2004  . NASAL SINUS SURGERY    . THYROID LOBECTOMY    . TOTAL THYROIDECTOMY     Family History  Problem Relation Age of Onset  . Arthritis/Rheumatoid Mother        died in age 7162s . Breast cancer Mother 444 . Breast cancer Maternal Grandmother        40's  . Breast cancer Maternal Aunt        X 2. 50's  . Lung cancer Maternal Aunt   .  Pancreatitis Neg Hx   . Colon cancer Neg Hx    Social History   Tobacco Use  . Smoking status: Never Smoker  . Smokeless tobacco: Never Used  Vaping Use  . Vaping Use: Never used  Substance Use Topics  . Alcohol use: No  . Drug use: No    Current Outpatient Medications:  .  acetaminophen (TYLENOL) 500 MG tablet, Take by mouth. , Disp: , Rfl:  .  albuterol (PROVENTIL HFA;VENTOLIN HFA) 108 (90 Base) MCG/ACT inhaler, Inhale 2 puffs into the lungs every 6 (six) hours as needed for wheezing or shortness of breath., Disp: 1 Inhaler, Rfl: 2 .  albuterol (PROVENTIL) (2.5 MG/3ML) 0.083% nebulizer solution, Take 3 mLs (2.5 mg total) by nebulization every 6 (six) hours as needed  for wheezing or shortness of breath., Disp: 75 mL, Rfl: 6 .  ALPRAZolam (XANAX) 0.5 MG tablet, Take 1 tablet (0.5 mg total) by mouth 2 (two) times daily as needed for anxiety or sleep., Disp: 60 tablet, Rfl: 2 .  amitriptyline (ELAVIL) 25 MG tablet, Take 1 tablet (25 mg total) by mouth at bedtime., Disp: 90 tablet, Rfl: 1 .  Ascorbic Acid (VITAMIN C) 100 MG tablet, Take 100 mg by mouth daily. , Disp: , Rfl:  .  calcium carbonate (OS-CAL) 1250 (500 Ca) MG chewable tablet, Chew by mouth., Disp: , Rfl:  .  carisoprodol (SOMA) 350 MG tablet, Take 350 mg by mouth at bedtime. Patient states that she takes 1/2 tablet nightly , Disp: , Rfl:  .  Dentifrices (BIOTENE DRY MOUTH DT), by Transmucosal route., Disp: , Rfl:  .  doxycycline (VIBRAMYCIN) 50 MG capsule, Take one cap po QD-BID. Take with food. (Patient taking differently: as needed. Take one cap po QD-BID. Take with food.), Disp: 60 capsule, Rfl: 0 .  eletriptan (RELPAX) 40 MG tablet, Take 40 mg by mouth as needed for migraine. may repeat in 2 hours if necessary , Disp: , Rfl:  .  EPINEPHRINE 0.3 mg/0.3 mL IJ SOAJ injection, USE AS DIRECTED, Disp: 1 Device, Rfl: 2 .  ezetimibe (ZETIA) 10 MG tablet, Take 1 tablet (10 mg total) by mouth daily., Disp: 90 tablet, Rfl: 1 .  famotidine (PEPCID) 10 MG tablet, Take 20 mg by mouth. , Disp: , Rfl:  .  fenofibrate (TRICOR) 145 MG tablet, Take 1 tablet (145 mg total) by mouth daily., Disp: 90 tablet, Rfl: 1 .  fluticasone (FLONASE) 50 MCG/ACT nasal spray, Place 2 sprays into both nostrils daily., Disp: 16 g, Rfl: 3 .  fluticasone furoate-vilanterol (BREO ELLIPTA) 100-25 MCG/INH AEPB, Inhale 1 puff into the lungs daily., Disp: 60 each, Rfl: 2 .  gabapentin (NEURONTIN) 600 MG tablet, Take 1/2 (one-half) tablet by mouth twice daily, Disp: 45 tablet, Rfl: 3 .  HYDROcodone-acetaminophen (NORCO) 7.5-325 MG tablet, Take 1-2 tablets by mouth every 6 (six) hours as needed for moderate pain., Disp: 30 tablet, Rfl: 0 .   hydroxychloroquine (PLAQUENIL) 200 MG tablet, Take 1 tablet (200 mg total) by mouth 2 (two) times daily., Disp: 180 tablet, Rfl: 0 .  hyoscyamine (ANASPAZ) 0.125 MG TBDP disintergrating tablet, Place 0.125 mg under the tongue every 4 (four) hours as needed for bladder spasms. , Disp: , Rfl:  .  Lactobacillus (DIGESTIVE HEALTH PROBIOTIC PO), Take 1 tablet by mouth daily. , Disp: , Rfl:  .  levothyroxine (SYNTHROID) 137 MCG tablet, Take 1 tablet (137 mcg total) by mouth daily before breakfast., Disp: 30 tablet, Rfl: 5 .  LINZESS 290 MCG  CAPS capsule, Take 290 mcg by mouth daily. , Disp: , Rfl:  .  Magnesium Oxide (NATRUL MAGNESIUM PO), Take by mouth. , Disp: , Rfl:  .  montelukast (SINGULAIR) 10 MG tablet, TAKE ONE TABLET BY MOUTH ONCE DAILY FOR ASTHMA, Disp: 90 tablet, Rfl: 3 .  Multiple Vitamins-Minerals (ZINC PO), Take by mouth daily., Disp: , Rfl:  .  mupirocin ointment (BACTROBAN) 2 %, as needed. , Disp: , Rfl: 0 .  pantoprazole (PROTONIX) 40 MG tablet, Take 40 mg by mouth daily. , Disp: , Rfl:  .  Polyethyl Glycol-Propyl Glycol 0.4-0.3 % SOLN, Apply to eye. , Disp: , Rfl:  .  PRESCRIPTION MEDICATION, 1 Syringe by Subdermal route once a week. Allergy shot , Disp: , Rfl:  .  Probiotic Product ([place]) CAPS, Take by mouth. , Disp: , Rfl:  .  sodium chloride (OCEAN) 0.65 % SOLN nasal spray, Place 1 spray into both nostrils as needed for congestion. , Disp: , Rfl:  .  sodium chloride 0.9 % nebulizer solution, , Disp: , Rfl:  .  SUMAtriptan-naproxen (TREXIMET) 85-500 MG tablet, daily as needed. , Disp: , Rfl:  .  Tuberculin-Allergy Syringes 28G X 1/2" 1 ML MISC, 1 each by Does not apply route every 14 (fourteen) days. For allergy injections, Disp: 12 each, Rfl: 3 .  Vaginal Lubricant (REPLENS) GEL, Place vaginally. , Disp: , Rfl:  .  [START ON 11/21/2020] venlafaxine XR (EFFEXOR-XR) 150 MG 24 hr capsule, Take 2 capsules (300 mg total) by mouth at bedtime., Disp: 180 capsule, Rfl: 1 .   Vitamin D, Ergocalciferol, (DRISDOL) 1.25 MG (50000 UNIT) CAPS capsule, TAKE 1 CAPSULE (50,000 UNITS TOTAL) BY MOUTH EVERY 7 (SEVEN) DAYS. MONDAY, Disp: 12 capsule, Rfl: 1 .  vitamin E 1000 UNIT capsule, Take by mouth. , Disp: , Rfl:  .  White Petrolatum-Mineral Oil ([place] PETROL-MINERAL OIL-LANOLIN) 0.1-0.1 % OINT, , Disp: , Rfl:  .  estradiol (CLIMARA) 0.1 mg/24hr patch, APPLY 1 PATCH TO SKIN ONCE WEEKLY, Disp: 12 patch, Rfl: 4 Allergies: Aspirin, Keflex [cephalexin], Azithromycin, Barley grass, Clarithromycin, Metoclopramide, Oat, Oatmeal, Other, [place] grass flower pollen extract [gramineae pollens], Vioxx [rofecoxib], Wheat bran, Wheat extract, Celebrex [celecoxib], and Metoclopramide hcl  Review of Systems  Constitutional: Negative for chills, fever and malaise/fatigue.  HENT: Negative for congestion, sinus pain and sore throat.   Eyes: Negative for blurred vision and pain.  Respiratory: Negative for cough and wheezing.   Cardiovascular: Negative for chest pain and leg swelling.  Gastrointestinal: Negative for abdominal pain, constipation, diarrhea, heartburn, nausea and vomiting.  Genitourinary: Negative for dysuria, frequency, hematuria and urgency.  Musculoskeletal: Negative for back pain, joint pain, myalgias and neck pain.  Skin: Negative for itching and rash.  Neurological: Negative for dizziness, tremors and weakness.  Endo/Heme/Allergies: Does not bruise/bleed easily.  Psychiatric/Behavioral: Negative for depression. The patient is not nervous/anxious and does not have insomnia.     Objective: BP 120/80   Ht 5' 5"  (1.651 m)   Wt 187 lb (84.8 kg)   BMI 31.12 kg/m   Filed Weights   10/16/20 0832  Weight: 187 lb (84.8 kg)   Body mass index is 31.12 kg/m. Physical Exam Constitutional:      General: She is not in acute distress.    Appearance: She is well-developed.  Genitourinary:     Vulva, urethra, bladder, vagina and rectum normal.     No lesions in the vagina.      Genitourinary Comments: Vaginal cuff well healed  No vaginal bleeding.      Right Adnexa: not tender and no mass present.    Left Adnexa: not tender and no mass present.    Cervix is absent.     Uterus is absent.     Pelvic exam was performed with patient supine.  Breasts:     Right: No mass, skin change or tenderness.     Left: No mass, skin change or tenderness.    HENT:     Head: Normocephalic and atraumatic. No laceration.     Right Ear: Hearing normal.     Left Ear: Hearing normal.     Mouth/Throat:     Pharynx: Uvula midline.  Eyes:     Pupils: Pupils are equal, round, and reactive to light.  Neck:     Thyroid: No thyromegaly.  Cardiovascular:     Rate and Rhythm: Normal rate and regular rhythm.     Heart sounds: No murmur heard. No friction rub. No gallop.   Pulmonary:     Effort: Pulmonary effort is normal. No respiratory distress.     Breath sounds: Normal breath sounds. No wheezing.  Abdominal:     General: Bowel sounds are normal. There is no distension.     Palpations: Abdomen is soft.     Tenderness: There is no abdominal tenderness. There is no rebound.  Musculoskeletal:        General: Normal range of motion.     Cervical back: Normal range of motion and neck supple.  Neurological:     Mental Status: She is alert and oriented to person, place, and time.     Cranial Nerves: No cranial nerve deficit.  Skin:    General: Skin is warm and dry.  Psychiatric:        Judgment: Judgment normal.  Vitals reviewed.     Assessment: Annual Exam 1. 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 Screen for colon cancer   3. Menopause     Plan:            1.  Vaginal Screening-  Pap smear done today  2. Breast screening- Exam annually and mammogram scheduled  3. Colonoscopy every 3-5 years, Hemoccult testing after age 78  4. Labs managed by PCP  5. Counseling for hormonal therapy: no change in therapy today Takes ERT  patch              6. FRAX - FRAX score for assessing the 10 year probability for fracture calculated and discussed today.  Based on age and score today, DEXA is not currently scheduled.    F/U  Return in about 1 year (around 10/16/2021) for Annual.  Barnett Applebaum, MD, [facility], [facility] 10/16/2020  8:50 AM

## 2020-10-18 ENCOUNTER — Other Ambulatory Visit: Payer: Self-pay | Admitting: Internal Medicine

## 2020-10-18 ENCOUNTER — Other Ambulatory Visit: Payer: Self-pay | Admitting: Obstetrics & Gynecology

## 2020-10-18 DIAGNOSIS — R69 Illness, unspecified: Secondary | ICD-10-CM | POA: Diagnosis not present

## 2020-10-18 LAB — CYTOLOGY - PAP: Diagnosis: UNDETERMINED — AB

## 2020-10-23 DIAGNOSIS — Z1211 Encounter for screening for malignant neoplasm of colon: Secondary | ICD-10-CM | POA: Diagnosis not present

## 2020-10-23 DIAGNOSIS — G4733 Obstructive sleep apnea (adult) (pediatric): Secondary | ICD-10-CM | POA: Diagnosis not present

## 2020-10-26 LAB — FECAL OCCULT BLOOD, IMMUNOCHEMICAL: Fecal Occult Bld: NEGATIVE

## 2020-11-04 DIAGNOSIS — Z23 Encounter for immunization: Secondary | ICD-10-CM | POA: Insufficient documentation

## 2020-11-04 DIAGNOSIS — Z13820 Encounter for screening for osteoporosis: Secondary | ICD-10-CM | POA: Insufficient documentation

## 2020-11-04 DIAGNOSIS — R591 Generalized enlarged lymph nodes: Secondary | ICD-10-CM | POA: Insufficient documentation

## 2020-11-23 DIAGNOSIS — G4733 Obstructive sleep apnea (adult) (pediatric): Secondary | ICD-10-CM | POA: Diagnosis not present

## 2020-12-24 DIAGNOSIS — G4733 Obstructive sleep apnea (adult) (pediatric): Secondary | ICD-10-CM | POA: Diagnosis not present

## 2021-01-10 DIAGNOSIS — G4733 Obstructive sleep apnea (adult) (pediatric): Secondary | ICD-10-CM | POA: Diagnosis not present

## 2021-01-21 DIAGNOSIS — G4733 Obstructive sleep apnea (adult) (pediatric): Secondary | ICD-10-CM | POA: Diagnosis not present

## 2021-01-28 ENCOUNTER — Telehealth: Payer: Self-pay | Admitting: Nurse Practitioner

## 2021-01-28 NOTE — Progress Notes (Signed)
  Chronic Care Management   Note  01/28/2021 Name: MEHEK GREGA MRN: [id number] DOB: 1956-01-21  MILIA WARTH is a 65 y.o. year old female who is a primary care patient of Ronnell Freshwater, NP. I reached out to Tonna Corner by phone today in response to a referral sent by Ms. Dwaine Deter PCP, Ronnell Freshwater, NP.   Ms. Kerekes was given information about Chronic Care Management services today including:  1. CCM service includes personalized support from designated clinical staff supervised by her physician, including individualized plan of care and coordination with other care providers 2. 24/7 contact phone numbers for assistance for urgent and routine care needs. 3. Service will only be billed when office clinical staff spend 20 minutes or more in a month to coordinate care. 4. Only one practitioner may furnish and bill the service in a calendar month. 5. The patient may stop CCM services at any time (effective at the end of the month) by phone call to the office staff.   Patient agreed to services and verbal consent obtained.   Follow up plan:   Carley Perdue [employer] Scheduler

## 2021-01-28 NOTE — Progress Notes (Signed)
  Chronic Care Management   Outreach Note  01/28/2021 Name: Susan [name] MRN: [id number] DOB: 08-04-1956  Referred by: Ronnell Freshwater, NP Reason for referral : No chief complaint on file.   An unsuccessful telephone outreach was attempted today. The patient was referred to the pharmacist for assistance with care management and care coordination.   Follow Up Plan:   Carley Perdue [employer] Scheduler

## 2021-02-19 NOTE — Progress Notes (Signed)
Virtual Visit via Video Note  I connected with Susan [name] on 02/27/21 at  8:40 AM EDT by a video enabled telemedicine application and verified that I am speaking with the correct person using two identifiers.  Location: Patient: home Provider: office Persons participated in the visit- patient, provider   I discussed the limitations of evaluation and management by telemedicine and the availability of in person appointments. The patient expressed understanding and agreed to proceed.   I discussed the assessment and treatment plan with the patient. The patient was provided an opportunity to ask questions and all were answered. The patient agreed with the plan and demonstrated an understanding of the instructions.   The patient was advised to call back or seek an in-person evaluation if the symptoms worsen or if the condition fails to improve as anticipated.  I provided 10 minutes of non-face-to-face time during this encounter.   Susan Clay, MD    [facility] MD/PA/NP OP Progress Note  02/27/2021 9:08 AM Susan [name]  MRN:  [id number]  Chief Complaint:  Chief Complaint    Follow-up; Depression     HPI:  This is a follow-up appointment for depression and anxiety.  She states that she has been doing very well.  She enjoyed staying in [place].  She brought back opossum as his mother died in a pouch.  She enjoys walking her dog, and time with her husband.  She was taking Xanax twice a day instead of 3 times a day when she was in [place] as it was relaxing.  She did not do much exercise, and reports some weight gain.  She is planning to work on this.  She denies feeling depressed.  She denies anhedonia.  She has occasional insomnia, which she attributes to using CPAP machine/dry mouth.  Although she occasionally feels irritable especially when she feels tired, she denies any concerns about this.  She denies panic attacks.  She denies alcohol use or drug use.  Although it was discussed to  discontinue amitriptyline especially given its side effect of weight gain and dry mouth, she wants to stay on the medication as she knows that her muscle spasm at night would get worsen without this medication.    She lives with her husband of 56 years, no children,  On disability Work: unemployed.  used to work as Electrical engineer for 29 years Education: graduated form high school, no IEP She grew up in [place], [place]. She had "two great christian parents."   185 lbs Wt Readings from Last 3 Encounters:  10/16/20 187 lb (84.8 kg)  10/05/20 184 lb 3.2 oz (83.6 kg)  10/03/20 185 lb (83.9 kg)    Visit Diagnosis:    ICD-10-CM   1. MDD (major depressive disorder), recurrent, in full remission ([place])  F33.42 amitriptyline (ELAVIL) 25 MG tablet    venlafaxine XR (EFFEXOR-XR) 150 MG 24 hr capsule  2. Generalized anxiety disorder  F41.1 amitriptyline (ELAVIL) 25 MG tablet    venlafaxine XR (EFFEXOR-XR) 150 MG 24 hr capsule    Past Psychiatric History: Please see initial evaluation for full details. I have reviewed the history. No updates at this time.     Past Medical History:  Past Medical History:  Diagnosis Date  . Arthritis   . Asthma   . Asthmatic bronchitis   . BRCA negative 05/2014   MyRisk neg  . Celiac disease   . Celiac sprue   . Chronic anxiety   . Complication of anesthesia  vomiting  . COPD (chronic obstructive pulmonary disease) ([place])   . Depression   . Environmental allergies   . Family history of breast cancer 05/2014   MyRisk neg; IBIS=29.5%  . Fibromyalgia    neuropathy all over  . Gastritis 10/08/2013  . GERD (gastroesophageal reflux disease)   . Hyperlipemia   . Hyperlipidemia    Intolerant to statins  . Hypothyroidism (acquired)   . Increased risk of breast cancer 05/2014   IBIS=29.5%  . Increased risk of breast cancer 05/2014   IBIS=29.5%  . Migraine   . Migraines   . 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 Pancreatitis   . Sjogren's  disease ([place])   . Spastic colon   . Thyroid cancer ([place]) 1990 and 1994   Total thyroidectomy with radioactive iodine.     Past Surgical History:  Procedure Laterality Date  . ABDOMINAL HYSTERECTOMY  2006  . BLADDER REPAIR    . BREAST CYST ASPIRATION Left yrs ago  . CHOLECYSTECTOMY  1989  . COLONOSCOPY WITH ESOPHAGOGASTRODUODENOSCOPY (EGD)    . COLONOSCOPY WITH PROPOFOL N/A 08/03/2017   Procedure: COLONOSCOPY WITH PROPOFOL;  Surgeon: Lollie Sails, MD;  Location: [facility] ENDOSCOPY;  Service: Endoscopy;  Laterality: N/A;  . COLONOSCOPY WITH PROPOFOL N/A 10/18/2018   Procedure: COLONOSCOPY WITH PROPOFOL;  Surgeon: Lollie Sails, MD;  Location: [facility] ENDOSCOPY;  Service: Endoscopy;  Laterality: N/A;  . ESOPHAGOGASTRODUODENOSCOPY N/A 10/07/2013   Procedure: ESOPHAGOGASTRODUODENOSCOPY (EGD);  Surgeon: Danie Binder, MD;  Location: [facility] ENDO SUITE;  Service: Endoscopy;  Laterality: N/A;  patient received heparin at 530am given phenergan 23m IV 30 minutes before  . EYE SURGERY     lacrimal gland   . FLEXIBLE BRONCHOSCOPY N/A 06/04/2016   Procedure: FLEXIBLE BRONCHOSCOPY;  Surgeon: SAllyne Gee MD;  Location: [facility] ORS;  Service: Pulmonary;  Laterality: N/A;  . INCONTINENCE SURGERY  2004  . NASAL SINUS SURGERY    . THYROID LOBECTOMY    . TOTAL THYROIDECTOMY      Family Psychiatric History: Please see initial evaluation for full details. I have reviewed the history. No updates at this time.     Family History:  Family History  Problem Relation Age of Onset  . Arthritis/Rheumatoid Mother        died in age 4339s . Breast cancer Mother 416 . Breast cancer Maternal Grandmother        40's  . Breast cancer Maternal Aunt        X 2. 50's  . Lung cancer Maternal Aunt   . Pancreatitis Neg Hx   . Colon cancer Neg Hx     Social History:  Social History   Socioeconomic History  . Marital status: Married    Spouse name: Not on file  . Number of children: 0  . Years of education:  Not on file  . Highest education level: Not on file  Occupational History  . Not on file  Tobacco Use  . Smoking status: Never Smoker  . Smokeless tobacco: Never Used  Vaping Use  . Vaping Use: Never used  Substance and Sexual Activity  . Alcohol use: No  . Drug use: No  . Sexual activity: Yes    Birth control/protection: Surgical  Other Topics Concern  . Not on file  Social History Narrative  . Not on file   Social Determinants of Health   Financial Resource Strain: Not on file  Food Insecurity: Not on file  Transportation Needs: Not on file  Physical Activity: Not on file  Stress: Not on file  Social Connections: Not on file    Allergies:  Allergies  Allergen Reactions  . Aspirin   . Keflex [Cephalexin] Shortness Of Breath  . Azithromycin Hives and Nausea Only  . Barley Grass   . Clarithromycin Other (See Comments)  . Metoclopramide Other (See Comments)    TREMORS Tremors Tremors   . Oat Other (See Comments)  . Oatmeal Other (See Comments)  . Other     Other reaction(s): Other (See Comments) KEOPLEX.  Marland Kitchen Rye Grass Flower Pollen Extract [Gramineae Pollens]   . Vioxx [Rofecoxib] Diarrhea  . Wheat Bran Other (See Comments)    Celiac disease  . Wheat Extract   . Celebrex [Celecoxib] Hives  . Metoclopramide Hcl Anxiety    BODY TREMORS    Metabolic Disorder Labs: No results found for: HGBA1C, MPG No results found for: PROLACTIN Lab Results  Component Value Date   CHOL 188 03/23/2020   TRIG 109 03/23/2020   HDL 48 03/23/2020   CHOLHDL 3.9 03/23/2020   VLDL 22 03/23/2020   LDLCALC 118 (H) 03/23/2020   LDLCALC 112 (H) 03/03/2019   Lab Results  Component Value Date   TSH 0.122 (L) 10/05/2020   TSH 0.063 (L) 03/23/2020    Therapeutic Level Labs: No results found for: LITHIUM No results found for: VALPROATE No components found for:  CBMZ  Current Medications: Current Outpatient Medications  Medication Sig Dispense Refill  . ALPRAZolam (XANAX)  0.5 MG tablet Take 0.5 mg by mouth 3 (three) times daily as needed.    Marland Kitchen acetaminophen (TYLENOL) 500 MG tablet Take by mouth.     Marland Kitchen albuterol (PROVENTIL HFA;VENTOLIN HFA) 108 (90 Base) MCG/ACT inhaler Inhale 2 puffs into the lungs every 6 (six) hours as needed for wheezing or shortness of breath. 1 Inhaler 2  . albuterol (PROVENTIL) (2.5 MG/3ML) 0.083% nebulizer solution Take 3 mLs (2.5 mg total) by nebulization every 6 (six) hours as needed for wheezing or shortness of breath. 75 mL 6  . [START ON 03/27/2021] amitriptyline (ELAVIL) 25 MG tablet Take 1 tablet (25 mg total) by mouth at bedtime. 90 tablet 1  . Ascorbic Acid (VITAMIN C) 100 MG tablet Take 100 mg by mouth daily.     Marland Kitchen BREO ELLIPTA 100-25 MCG/INH AEPB 1 PUFF BY MOUTH ONCE DAILY 60 each 2  . calcium carbonate (OS-CAL) 1250 (500 Ca) MG chewable tablet Chew by mouth.    . carisoprodol (SOMA) 350 MG tablet Take 350 mg by mouth at bedtime. Patient states that she takes 1/2 tablet nightly     . Dentifrices (BIOTENE DRY MOUTH DT) by Transmucosal route.    . doxycycline (VIBRAMYCIN) 50 MG capsule Take one cap po QD-BID. Take with food. (Patient taking differently: as needed. Take one cap po QD-BID. Take with food.) 60 capsule 0  . eletriptan (RELPAX) 40 MG tablet Take 40 mg by mouth as needed for migraine. may repeat in 2 hours if necessary     . EPINEPHRINE 0.3 mg/0.3 mL IJ SOAJ injection INJECT CONTENTS OF 1 PEN AS NEEDED FOR ALLERGIC REACTION 2 each 1  . estradiol (CLIMARA) 0.1 mg/24hr patch APPLY 1 PATCH TO SKIN ONCE WEEKLY 12 patch 4  . ezetimibe (ZETIA) 10 MG tablet Take 1 tablet (10 mg total) by mouth daily. 90 tablet 1  . famotidine (PEPCID) 10 MG tablet Take 20 mg by mouth.     . fenofibrate (TRICOR) 145 MG tablet Take 1 tablet (  145 mg total) by mouth daily. 90 tablet 1  . fluticasone (FLONASE) 50 MCG/ACT nasal spray Place 2 sprays into both nostrils daily. 16 g 3  . gabapentin (NEURONTIN) 600 MG tablet Take 1/2 (one-half) tablet by  mouth twice daily 45 tablet 3  . HYDROcodone-acetaminophen (NORCO) 7.5-325 MG tablet Take 1-2 tablets by mouth every 6 (six) hours as needed for moderate pain. 30 tablet 0  . hydroxychloroquine (PLAQUENIL) 200 MG tablet Take 1 tablet (200 mg total) by mouth 2 (two) times daily. 180 tablet 0  . hyoscyamine (ANASPAZ) 0.125 MG TBDP disintergrating tablet Place 0.125 mg under the tongue every 4 (four) hours as needed for bladder spasms.     . Lactobacillus (DIGESTIVE HEALTH PROBIOTIC PO) Take 1 tablet by mouth daily.     Marland Kitchen levothyroxine (SYNTHROID) 137 MCG tablet Take 1 tablet (137 mcg total) by mouth daily before breakfast. 30 tablet 5  . LINZESS 290 MCG CAPS capsule Take 290 mcg by mouth daily.     . Magnesium Oxide (NATRUL MAGNESIUM PO) Take by mouth.     . montelukast (SINGULAIR) 10 MG tablet TAKE ONE TABLET BY MOUTH ONCE DAILY FOR ASTHMA 90 tablet 3  . Multiple Vitamins-Minerals (ZINC PO) Take by mouth daily.    . mupirocin ointment (BACTROBAN) 2 % as needed.   0  . pantoprazole (PROTONIX) 40 MG tablet Take 40 mg by mouth daily.     Vladimir Faster Glycol-Propyl Glycol 0.4-0.3 % SOLN Apply to eye.     Marland Kitchen PRESCRIPTION MEDICATION 1 Syringe by Subdermal route once a week. Allergy shot     . Probiotic Product ([place]) CAPS Take by mouth.     . sodium chloride (OCEAN) 0.65 % SOLN nasal spray Place 1 spray into both nostrils as needed for congestion.     . sodium chloride 0.9 % nebulizer solution     . SUMAtriptan-naproxen (TREXIMET) 85-500 MG tablet daily as needed.     . Tuberculin-Allergy Syringes 28G X 1/2" 1 ML MISC 1 each by Does not apply route every 14 (fourteen) days. For allergy injections 12 each 3  . Vaginal Lubricant (REPLENS) GEL Place vaginally.     Derrill Memo ON 05/21/2021] venlafaxine XR (EFFEXOR-XR) 150 MG 24 hr capsule Take 2 capsules (300 mg total) by mouth at bedtime. 180 capsule 0  . Vitamin D, Ergocalciferol, (DRISDOL) 1.25 MG (50000 UNIT) CAPS capsule TAKE 1 CAPSULE  (50,000 UNITS TOTAL) BY MOUTH EVERY 7 (SEVEN) DAYS. MONDAY 12 capsule 1  . vitamin E 1000 UNIT capsule Take by mouth.     Marland Kitchen White Petrolatum-Mineral Oil ([place] PETROL-MINERAL OIL-LANOLIN) 0.1-0.1 % OINT      No current facility-administered medications for this visit.     Musculoskeletal: Strength & Muscle Tone: N/A Gait & Station: N/A Patient leans: N/A  Psychiatric Specialty Exam: Review of Systems  Psychiatric/Behavioral: Negative for agitation, behavioral problems, confusion, decreased concentration, dysphoric mood, hallucinations, self-injury, sleep disturbance and suicidal ideas. The patient is nervous/anxious. The patient is not hyperactive.   All other systems reviewed and are negative.   There were no vitals taken for this visit.There is no height or weight on file to calculate BMI.  General Appearance: Fairly Groomed  Eye Contact:  Good  Speech:  Clear and Coherent  Volume:  Normal  Mood:  good  Affect:  Appropriate, Congruent and Full Range  Thought Process:  Coherent  Orientation:  Full (Time, Place, and Person)  Thought Content: Logical   Suicidal Thoughts:  No  Homicidal Thoughts:  No  Memory:  Immediate;   Good  Judgement:  Good  Insight:  Good  Psychomotor Activity:  Normal  Concentration:  Concentration: Good and Attention Span: Good  Recall:  Good  Fund of Knowledge: Good  Language: Good  Akathisia:  No  Handed:  Right  AIMS (if indicated): not done  Assets:  Communication Skills Desire for Improvement  ADL's:  Intact  Cognition: WNL  Sleep:  Good   Screenings: Mini-Mental   Flowsheet Row Clinical Support from 03/22/2020 in [facility], [place] from 03/22/2019 in [facility], [facility]  Total Score (max 30 points ) 30 30    PHQ2-9   Flowsheet Row Video Visit from 02/27/2021 in [place] Office Visit from 10/05/2020 in [facility], [place] from 03/22/2020 in [facility], [facility] Office Visit from 10/14/2019 in [facility], [facility] Office Visit from 07/14/2019 in [facility], [facility]  PHQ-2 Total Score 0 0 0 0 0       Assessment and Plan:  Susan [name] is a 65 y.o. year old female with a history of anxiety,bilateral osteoarthritis, COPD, s/p thyroidectomy, hypothyroidism, history ofCeliac Disease, GERD, Sjogren's syndrome, who presents for follow up appointment for below.   1. MDD (major depressive disorder), recurrent, in full remission ([place]) 2. Generalized anxiety disorder She denies significant mood symptoms since the last visit.  Psychosocial stressors includes loss of her sister-in-law,  loss of her parents a few years ago, her medical condition of pain and tremors.   Although it was discussed to discontinue amitriptyline to avoid polypharmacy and other potential side effects, she reports strong preference to stay on this medication as it has been helping her for muscle spasm at night.  Will continue venlafaxine for depression and anxiety.   R/o tardive dyskinesia She reports worsening in tremors since being on Reglan.  Although valbenazine was discussed, she is not interested in this treatment.   Plan I have reviewed and updated plans as below 1. Continue venlafaxine 300 mg daily  2. Continueamitriptyline25 mg at night (anticholinergic side effect from higher dose) 3. Return to clinic-9/7 at 8:40 for 20 mins, video - oncarisoprodol(Soma), gabapentin 300 mg BID - on xanax 0.5 mg  TID, prescribed by Dr. Merlene Laughter  Past trials of medication:sertraline, Paxil, venlafaxine, Xanax, Ritalin (worked very well)  The patient demonstrates the following risk factors for suicide: Chronic risk factors for suicide include:psychiatric disorder ofanxiety. Acute risk factorsfor suicide include: N/A. Protective factorsfor this patient include: positive social support, responsibility to others (children, family),  coping skills and hope for the future. Considering these factors, the overall suicide risk at this point appears to below.She owns a gun.Patientisappropriate for outpatient follow up.   Susan Clay, MD 02/27/2021, 9:08 AM

## 2021-02-20 NOTE — Progress Notes (Signed)
Office Visit Note  Patient: Susan [name]             Date of Birth: Feb 05, 1956           MRN: [id number]             PCP: Ronnell Freshwater, NP Referring: Ronnell Freshwater, NP Visit Date: 03/06/2021 Occupation: @GUAROCC @  Subjective:  Sicca symptoms   History of Present Illness: SAVON COBBS is a 65 y.o. female with history of Sjogren's syndrome and osteoarthritis.  Patient reports that she discontinued Plaquenil 3 to 4 months ago while in [place] due to the concern for photosensitivity.  She states that she has not noticed any new or worsening symptoms since discontinuing Plaquenil and does not want to restart at this time.  She continues to have chronic sicca symptoms and has been using several over-the-counter products including Biotene and Systane for symptomatic relief.  She continues to see her dentist and ophthalmologist every 6 months. She denies any dental caries. She has tried using a humidifier seasonally to try to improve her symptoms.  She also has a humidifier on her CPAP.  She has occasional sores in her nose due to the dryness and has started to use Vaseline topically.  She continues to have shortness of breath which she attributes to her underlying asthma.  She has an upcoming appointment with her ophthalmologist today.  She has persistent fatigue on a daily basis.  She is able to sleep through the night and can even take a nap for several hours during the day and still feels tired.  She reports that since coming back from [place] she has had increased arthralgias and joint stiffness which she attributes to cooler weather temperatures.  She has noticed intermittent stiffness and swelling in both hands as well as the right ankle joint.  She has continued to walk on a daily basis for exercise.  She has been using electric blanket which helps with some of her arthralgias.   Activities of Daily Living:  Patient reports morning stiffness for 30 minutes.   Patient Reports  nocturnal pain.  Difficulty dressing/grooming: Reports Difficulty climbing stairs: Denies Difficulty getting out of chair: Denies Difficulty using hands for taps, buttons, cutlery, and/or writing: Reports  Review of Systems  Constitutional: Positive for fatigue.  HENT: Positive for mouth dryness and nose dryness. Negative for mouth sores.   Eyes: Positive for pain, itching and dryness.  Respiratory: Positive for shortness of breath. Negative for difficulty breathing.   Cardiovascular: Negative for chest pain and palpitations.  Gastrointestinal: Positive for constipation and diarrhea. Negative for blood in stool.  Endocrine: Negative for increased urination.  Genitourinary: Positive for difficulty urinating. Negative for painful urination.  Musculoskeletal: Positive for arthralgias, joint pain, myalgias, morning stiffness, muscle tenderness and myalgias. Negative for joint swelling.  Skin: Negative for color change, rash and redness.  Allergic/Immunologic: Negative for susceptible to infections.  Neurological: Positive for memory loss. Negative for dizziness, numbness, headaches and weakness.  Hematological: Negative for bruising/bleeding tendency.  Psychiatric/Behavioral: Negative for confusion.    PMFS History:  Patient Active Problem List   Diagnosis Date Noted  . Lymphadenopathy of head and neck 11/04/2020  . Screening for osteoporosis 11/04/2020  . Need for vaccination against Streptococcus pneumoniae using pneumococcal conjugate vaccine 13 11/04/2020  . Menopause 10/16/2020  . 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 Essential tremor 06/20/2020  . OSA (obstructive sleep apnea) 03/26/2020  . Chronic wrist  pain, right 11/02/2019  . Chronic pain of left wrist 11/02/2019  . Pain in joints of right hand 11/02/2019  . Pain in joint of left hand 11/02/2019  . Primary osteoarthritis of both hands 11/02/2019  . MDD (major depressive disorder),  recurrent, in partial remission ([place]) 10/05/2019  . Dysuria 04/03/2019  . Generalized anxiety disorder 02/28/2019  . Chronic pain of both knees 03/01/2018  . Encounter for general adult medical examination with abnormal findings 03/01/2018  . Primary osteoarthritis of both first carpometacarpal joints 03/01/2018  . Chronic obstructive pulmonary disease, unspecified ([place]) 12/04/2017  . Shortness of breath 12/04/2017  . Allergic rhinitis, unspecified 12/04/2017  . 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 Vaginal atrophy 09/01/2017  . Chronic right-sided low back pain without sciatica 01/05/2017  . Primary Sjogren's syndrome ([place]) 01/05/2017  . CD (celiac disease) 04/17/2015  . Gastroesophageal reflux disease with esophagitis 04/17/2015  . Hx of Sjogren's disease ([place]) 04/17/2015  . Acute asthma exacerbation 10/14/2013  . Acute respiratory failure ([place]) 10/14/2013  . Gastritis 10/08/2013  . Bradycardia 10/07/2013  . Esophageal spasm 10/06/2013  . Substernal precordial chest pain 10/06/2013  . Chronic anxiety 10/06/2013  . GERD (gastroesophageal reflux disease) 10/06/2013  . Chronic asthma 10/06/2013  . 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 Depression with anxiety 10/06/2013  . Fibromyalgia 10/06/2013  . Hypothyroidism 10/06/2013  . Chronic migraine 10/06/2013    Past Medical History:  Diagnosis Date  . Arthritis   . Asthma   . Asthmatic bronchitis   . BRCA negative 05/2014   MyRisk neg  . Celiac disease   . Celiac sprue   . Chronic anxiety   . Complication of anesthesia    vomiting  . COPD (chronic obstructive pulmonary disease) ([place])   . Depression   . Environmental allergies   . Family history of breast cancer 05/2014   MyRisk neg; IBIS=29.5%  . Fibromyalgia    neuropathy all over  . Gastritis 10/08/2013  . GERD (gastroesophageal reflux disease)   . Hyperlipemia   . Hyperlipidemia    Intolerant to statins  . Hypothyroidism  (acquired)   . Increased risk of breast cancer 05/2014   IBIS=29.5%  . Increased risk of breast cancer 05/2014   IBIS=29.5%  . Migraine   . Migraines   . 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 Pancreatitis   . Sjogren's disease ([place])   . Spastic colon   . Thyroid cancer ([place]) 1990 and 1994   Total thyroidectomy with radioactive iodine.     Family History  Problem Relation Age of Onset  . Arthritis/Rheumatoid Mother        died in age 54s  . Breast cancer Mother 55  . Breast cancer Maternal Grandmother        40's  . Breast cancer Maternal Aunt        X 2. 50's  . Lung cancer Maternal Aunt   . Pancreatitis Neg Hx   . Colon cancer Neg Hx    Past Surgical History:  Procedure Laterality Date  . ABDOMINAL HYSTERECTOMY  2006  . BLADDER REPAIR    . BREAST CYST ASPIRATION Left yrs ago  . CHOLECYSTECTOMY  1989  . COLONOSCOPY WITH ESOPHAGOGASTRODUODENOSCOPY (EGD)    . COLONOSCOPY WITH PROPOFOL N/A 08/03/2017   Procedure: COLONOSCOPY WITH PROPOFOL;  Surgeon: Lollie Sails, MD;  Location: [facility] ENDOSCOPY;  Service: Endoscopy;  Laterality: N/A;  . COLONOSCOPY WITH PROPOFOL N/A 10/18/2018   Procedure: COLONOSCOPY WITH PROPOFOL;  Surgeon: Lollie Sails, MD;  Location: [facility] ENDOSCOPY;  Service: Endoscopy;  Laterality: N/A;  . ESOPHAGOGASTRODUODENOSCOPY N/A 10/07/2013   Procedure: ESOPHAGOGASTRODUODENOSCOPY (EGD);  Surgeon: Danie Binder, MD;  Location: [facility] ENDO SUITE;  Service: Endoscopy;  Laterality: N/A;  patient received heparin at 530am given phenergan 20m IV 30 minutes before  . EYE SURGERY     lacrimal gland   . FLEXIBLE BRONCHOSCOPY N/A 06/04/2016   Procedure: FLEXIBLE BRONCHOSCOPY;  Surgeon: SAllyne Gee MD;  Location: [facility] ORS;  Service: Pulmonary;  Laterality: N/A;  . INCONTINENCE SURGERY  2004  . NASAL SINUS SURGERY    . THYROID LOBECTOMY    . TOTAL THYROIDECTOMY     Social History   Social History Narrative  . Not on file   Immunization History  Administered  Date(s) Administered  . Influenza Inj Mdck Quad Pf 07/22/2018, 07/05/2019, 06/27/2020  . Influenza-Unspecified 08/17/2017  . Moderna Sars-Covid-2 Vaccination 12/03/2019, 01/24/2020, 08/13/2020  . Tdap 07/27/2019  . Zoster Recombinat (Shingrix) 08/21/2017, 12/08/2017     Objective: Vital Signs: BP 117/79 (BP Location: Left Arm, Patient Position: Sitting, Cuff Size: Normal)   Pulse 74   Resp 16   Ht 5' 5.25" (1.657 m)   Wt 188 lb 6.4 oz (85.5 kg)   BMI 31.11 kg/m    Physical Exam Vitals and nursing note reviewed.  Constitutional:      Appearance: She is well-developed.  HENT:     Head: Normocephalic and atraumatic.  Eyes:     Conjunctiva/sclera: Conjunctivae normal.  Pulmonary:     Effort: Pulmonary effort is normal.  Abdominal:     Palpations: Abdomen is soft.  Musculoskeletal:     Cervical back: Normal range of motion.  Lymphadenopathy:     Cervical: No cervical adenopathy.  Skin:    General: Skin is warm and dry.     Capillary Refill: Capillary refill takes less than 2 seconds.  Neurological:     Mental Status: She is alert and oriented to person, place, and time.  Psychiatric:        Behavior: Behavior normal.      Musculoskeletal Exam: C-spine has slightly limited range of motion with lateral rotation.  Shoulder joints, elbow joints, wrist joints, MCPs, PIPs, DIPs have good range of motion with no synovitis.  Some tenderness over the right shoulder noted.  CMC joint thickening and tenderness noted bilaterally.  PIP and DIP thickening consistent with osteoarthritis of both hands.  No tenderness or synovitis over MCP joints.  She was able to make a complete fist bilaterally.  Hip joints have good range of motion with no discomfort.  Knee joints have good range of motion with no warmth or effusion.  She has tenderness and inflammation over the right ankle joint.  PIP and DIP thickening consistent with osteoarthritis of both feet noted.  CDAI Exam: CDAI Score: -- Patient  Global: --; Provider Global: -- Swollen: 1 ; Tender: 4  Joint Exam 03/06/2021      Right  Left  Glenohumeral   Tender     CMC   Tender   Tender  Ankle  Swollen Tender        Investigation: No additional findings.  Imaging: No results found.  Recent Labs: Lab Results  Component Value Date   WBC 5.5 10/05/2020   HGB 13.6 10/05/2020   PLT 359 10/05/2020   NA 140 10/03/2020   K 4.1 10/03/2020   CL 106 10/03/2020   CO2 28 10/03/2020   GLUCOSE  110 (H) 10/03/2020   BUN 17 10/03/2020   CREATININE 0.86 10/03/2020   BILITOT 0.3 10/03/2020   ALKPHOS 79 08/22/2020   AST 16 10/03/2020   ALT 19 10/03/2020   PROT 7.1 10/03/2020   ALBUMIN 4.4 08/22/2020   CALCIUM 9.6 10/03/2020   GFRAA 82 10/03/2020    Speciality Comments: PLQ Eye Exam: 09/28/2020 WNL @ [facility] Follow up in 1 year  Procedures:  No procedures performed Allergies: Aspirin, Keflex [cephalexin], Azithromycin, Barley grass, Clarithromycin, Metoclopramide, Oat, Oatmeal, Other, Rye grass flower pollen extract [gramineae pollens], Vioxx [rofecoxib], Wheat bran, Wheat extract, Celebrex [celecoxib], and Metoclopramide hcl   Assessment / Plan:     Visit Diagnoses: Sjogren's syndrome with other organ involvement ([place]) - Diagnosed at [place] 12 years ago and was placed on Plaquenil.  History of dry mouth, dental decay and dry eyes.  ANA positive, Ro negative, low negative, RF negative: She continues to have chronic sicca symptoms on a daily basis.  She has been using several over-the-counter products for symptomatic relief including Biotene as well as Systane eyedrops.  She is also been seeing her dentist and ophthalmologist every 6 months for routine follow-up.  She has not had any recent dental caries.  She has no parotid swelling or tenderness on examination today.  No cervical lymphadenopathy was palpable.  She was previously taking Plaquenil 200 mg 1 tablet by mouth twice daily but she discontinued 3 to 4 months ago  due to the concern for photosensitivity.  She has not noticed any new or worsening symptoms since discontinuing Plaquenil and does not want to restart at this time.  On examination today she had tenderness and synovitis over the right ankle joint.  She has been experiencing increased arthralgias and joint stiffness since returning from [place] which she attributes to cooler weather temperatures.  She was strongly encouraged to restart on Plaquenil but she declined at this time.  We will obtain the following lab work today.  She was advised to notify us if she develops any new or worsening symptoms. She will follow up in 5 months.   - Plan: CBC with Differential/Platelet, COMPLETE METABOLIC PANEL WITH GFR, Urinalysis, Routine w reflex microscopic, Rheumatoid factor, Serum protein electrophoresis with reflex, ANA, Sjogrens syndrome-A extractable nuclear antibody, Sjogrens syndrome-B extractable nuclear antibody  High risk medication use - She discontinued Plaquenil 3 to 4 months ago due to the concern for photosensitivity.  She declined restarting Plaquenil at this time.- Plan: CBC with Differential/Platelet, COMPLETE METABOLIC PANEL WITH GFR  Primary osteoarthritis of both hands: She has PIP and DIP thickening consistent with osteoarthritis of both hands.  She continues to experience intermittent pain and stiffness in both hands.  She is able to make a complete fist bilaterally.  We discussed the importance of joint protection and muscle strengthening.  Primary osteoarthritis of both knees: She has good range of motion of both knee joints on exam.  No warmth or effusion was noted.  Primary osteoarthritis of both feet: She has PIP and DIP thickening consistent with osteoarthritis of both feet.   She has painful range of motion of the right ankle joint on examination today.  Tenderness and inflammation over the lateral aspect of the right ankle was noted.  She was strongly encouraged to restart on Plaquenil  but she declined at this time.  We discussed the importance of wearing proper fitting shoes.  Fibromyalgia: She continues to have generalized myalgias and muscle tenderness due to fibromyalgia.  Generalized hyperalgesia was  noted.  She continues to have persistent fatigue on a daily basis.  She has been sleeping well at night using her CPAP.  We discussed the importance of regular exercise and good sleep hygiene.  Chronic pain syndrome: She takes hydrocodone as needed for pain relief.  Other medical conditions are listed as follows:  CD (celiac disease)  Nutcracker esophagus  Esophageal spasm  History of gastroesophageal reflux (GERD)  Chronic obstructive pulmonary disease, unspecified COPD type ([place])  Generalized anxiety disorder  MDD (major depressive disorder), recurrent, in partial remission ([place])  History of asthma  History of thyroid cancer  Family history of rheumatoid arthritis  Orders: Orders Placed This Encounter  Procedures  . CBC with Differential/Platelet  . COMPLETE METABOLIC PANEL WITH GFR  . Urinalysis, Routine w reflex microscopic  . Rheumatoid factor  . Serum protein electrophoresis with reflex  . ANA  . Sjogrens syndrome-A extractable nuclear antibody  . Sjogrens syndrome-B extractable nuclear antibody   No orders of the defined types were placed in this encounter.    Follow-Up Instructions: Return in about 5 months (around 08/06/2021) for Sjogren's syndrome.   Ofilia Neas, PA-C  Note - This record has been created using Dragon software.  Chart creation errors have been sought, but may not always  have been located. Such creation errors do not reflect on  the standard of medical care.

## 2021-02-21 DIAGNOSIS — G4733 Obstructive sleep apnea (adult) (pediatric): Secondary | ICD-10-CM | POA: Diagnosis not present

## 2021-02-27 ENCOUNTER — Telehealth (INDEPENDENT_AMBULATORY_CARE_PROVIDER_SITE_OTHER): Payer: Medicare HMO | Admitting: Psychiatry

## 2021-02-27 ENCOUNTER — Other Ambulatory Visit: Payer: Self-pay

## 2021-02-27 ENCOUNTER — Encounter: Payer: Self-pay | Admitting: Psychiatry

## 2021-02-27 DIAGNOSIS — F3342 Major depressive disorder, recurrent, in full remission: Secondary | ICD-10-CM

## 2021-02-27 DIAGNOSIS — F411 Generalized anxiety disorder: Secondary | ICD-10-CM | POA: Diagnosis not present

## 2021-02-27 DIAGNOSIS — R69 Illness, unspecified: Secondary | ICD-10-CM | POA: Diagnosis not present

## 2021-02-27 MED ORDER — VENLAFAXINE HCL ER 150 MG PO CP24: 300.0000 mg | ORAL_CAPSULE | Freq: Every day | ORAL | 0 refills | Status: DC

## 2021-02-27 MED ORDER — AMITRIPTYLINE HCL 25 MG PO TABS: 25.0000 mg | ORAL_TABLET | Freq: Every day | ORAL | 1 refills | Status: DC

## 2021-02-27 NOTE — Patient Instructions (Signed)
1. Continue venlafaxine 300 mg daily  2. Continueamitriptyline25 mg at night 3. Return to clinic-9/7 at 8:40

## 2021-03-01 ENCOUNTER — Telehealth: Payer: Self-pay | Admitting: Pharmacist

## 2021-03-01 NOTE — Progress Notes (Addendum)
Chronic Care Management Pharmacy Assistant   Name: Susan [name]  MRN: [id number] DOB: [birth year]  Susan [name] is an 65 y.o. year old female who presents for his initial CCM visit with the clinical pharmacist.  Reason for Encounter: Chart Prep   Conditions to be addressed/monitored: COPD, GERD, Osteoarthritis, Chronic Migraine, Hypothyroidism.  Primary concerns for visit include: Medication Cost  Recent office visits:  10/05/20 Susan Freshwater, NP.  For Follow-Up. No medication changes.  10/02/20 Susan Guise, MD. For follow-Up. INCREASED/CHANED Doxycyline 50 mg to Take one cap po QD-BID. Take with food. STOPPED Multiple Vitamins.  Recent consult visits:  02/27/21 Susan Harrier, MD. For depression. No information given. 10/16/20 Obstetrics and Gynecology Susan Kingfisher Susan Ham, MD. Follow-Up.  STARTED Estradiol 01 mg/24 hr APPLY 1 PATCH TO SKIN ONCE WEEKLY  10/04/20 Susan [name], Stana Bunting, NP Susan [name], Susan Catholic, MD. No information given.  10/03/20 Susan Merino, MD. For Sjogrens syndrome. Per note: Discussed hand exercises. No medication changes.  09/28/20 Susan [name]. Susan Hartmann, NP. For lower extremities. No medication changes.  09/26/20 Behavioral Susan Clay, MD. For depression. No information given.  Hospital visits:  None in previous 6 months  Medications: Outpatient Encounter Medications as of 03/01/2021  Medication Sig   acetaminophen (TYLENOL) 500 MG tablet Take by mouth.    albuterol (PROVENTIL HFA;VENTOLIN HFA) 108 (90 Base) MCG/ACT inhaler Inhale 2 puffs into the lungs every 6 (six) hours as needed for wheezing or shortness of breath.   albuterol (PROVENTIL) (2.5 MG/3ML) 0.083% nebulizer solution Take 3 mLs (2.5 mg total) by nebulization every 6 (six) hours as needed for wheezing or shortness of breath.   ALPRAZolam (XANAX) 0.5 MG tablet Take 0.5 mg by mouth 3 (three) times daily as needed.   [START ON 03/27/2021] amitriptyline  (ELAVIL) 25 MG tablet Take 1 tablet (25 mg total) by mouth at bedtime.   Ascorbic Acid (VITAMIN C) 100 MG tablet Take 100 mg by mouth daily.    BREO ELLIPTA 100-25 MCG/INH AEPB 1 PUFF BY MOUTH ONCE DAILY   calcium carbonate (OS-CAL) 1250 (500 Ca) MG chewable tablet Chew by mouth.   carisoprodol (SOMA) 350 MG tablet Take 350 mg by mouth at bedtime. Patient states that she takes 1/2 tablet nightly    Dentifrices (BIOTENE DRY MOUTH DT) by Transmucosal route.   doxycycline (VIBRAMYCIN) 50 MG capsule Take one cap po QD-BID. Take with food. (Patient taking differently: as needed. Take one cap po QD-BID. Take with food.)   eletriptan (RELPAX) 40 MG tablet Take 40 mg by mouth as needed for migraine. may repeat in 2 hours if necessary    EPINEPHRINE 0.3 mg/0.3 mL IJ SOAJ injection INJECT CONTENTS OF 1 PEN AS NEEDED FOR ALLERGIC REACTION   estradiol (CLIMARA) 0.1 mg/24hr patch APPLY 1 PATCH TO SKIN ONCE WEEKLY   ezetimibe (ZETIA) 10 MG tablet Take 1 tablet (10 mg total) by mouth daily.   famotidine (PEPCID) 10 MG tablet Take 20 mg by mouth.    fenofibrate (TRICOR) 145 MG tablet Take 1 tablet (145 mg total) by mouth daily.   fluticasone (FLONASE) 50 MCG/ACT nasal spray Place 2 sprays into both nostrils daily.   gabapentin (NEURONTIN) 600 MG tablet Take 1/2 (one-half) tablet by mouth twice daily   HYDROcodone-acetaminophen (NORCO) 7.5-325 MG tablet Take 1-2 tablets by mouth every 6 (six) hours as needed for moderate pain.   hydroxychloroquine (PLAQUENIL) 200 MG tablet Take 1 tablet (200 mg total) by mouth 2 (two)  times daily.   hyoscyamine (ANASPAZ) 0.125 MG TBDP disintergrating tablet Place 0.125 mg under the tongue every 4 (four) hours as needed for bladder spasms.    Lactobacillus (DIGESTIVE HEALTH PROBIOTIC PO) Take 1 tablet by mouth daily.    levothyroxine (SYNTHROID) 137 MCG tablet Take 1 tablet (137 mcg total) by mouth daily before breakfast.   LINZESS 290 MCG CAPS capsule Take 290 mcg by mouth daily.     Magnesium Oxide (NATRUL MAGNESIUM PO) Take by mouth.    montelukast (SINGULAIR) 10 MG tablet TAKE ONE TABLET BY MOUTH ONCE DAILY FOR ASTHMA   Multiple Vitamins-Minerals (ZINC PO) Take by mouth daily.   mupirocin ointment (BACTROBAN) 2 % as needed.    pantoprazole (PROTONIX) 40 MG tablet Take 40 mg by mouth daily.    Polyethyl Glycol-Propyl Glycol 0.4-0.3 % SOLN Apply to eye.    PRESCRIPTION MEDICATION 1 Syringe by Subdermal route once a week. Allergy shot    Probiotic Product ([place]) CAPS Take by mouth.    sodium chloride (OCEAN) 0.65 % SOLN nasal spray Place 1 spray into both nostrils as needed for congestion.    sodium chloride 0.9 % nebulizer solution    SUMAtriptan-naproxen (TREXIMET) 85-500 MG tablet daily as needed.    Tuberculin-Allergy Syringes 28G X 1/2" 1 ML MISC 1 each by Does not apply route every 14 (fourteen) days. For allergy injections   Vaginal Lubricant (REPLENS) GEL Place vaginally.    [START ON 05/21/2021] venlafaxine XR (EFFEXOR-XR) 150 MG 24 hr capsule Take 2 capsules (300 mg total) by mouth at bedtime.   Vitamin D, Ergocalciferol, (DRISDOL) 1.25 MG (50000 UNIT) CAPS capsule TAKE 1 CAPSULE (50,000 UNITS TOTAL) BY MOUTH EVERY 7 (SEVEN) DAYS. MONDAY   vitamin E 1000 UNIT capsule Take by mouth.    White Petrolatum-Mineral Oil ([place] PETROL-MINERAL OIL-LANOLIN) 0.1-0.1 % OINT    No facility-administered encounter medications on file as of 03/01/2021.    Have you seen any other providers since your last visit? Patient stated no.  Any changes in your medications or health? Patient stared no.   Any side effects from any medications? Patient stated no.  Do you have an symptoms or problems not managed by your medications? Patient stated no.  Any concerns about your health right now? Patient stated no.  Has your provider asked that you check blood pressure, blood sugar, or follow special diet at home? Patient stated she is on a gluten free diet.   Do you get  any type of exercise on a regular basis? Patient stated no.  Can you think of a goal you would like to reach for your health? Patient stared walks daily for about 15 min.  Do you have any problems getting your medications? Patient stated Linzess, Efferxor, Treximet is too expensive.   Is there anything that you would like to discuss during the appointment? Patient stated no.  Please bring medications and supplements to appointment,patient reminded of face to face appointment on 5/11 at 130 pm.  Follow-Up:Pharmacist Review  Charlann Lange, [place] Pharmacist Assistant [phone]

## 2021-03-02 ENCOUNTER — Other Ambulatory Visit: Payer: Self-pay | Admitting: Hospice and Palliative Medicine

## 2021-03-02 DIAGNOSIS — E785 Hyperlipidemia, unspecified: Secondary | ICD-10-CM

## 2021-03-04 NOTE — Progress Notes (Signed)
Chronic Care Management Pharmacy Note  03/07/2021 Name:  Susan [name] MRN:  [id number] DOB:  [birth year]  Subjective: Susan [name] is an 65 y.o. year old female who is a primary patient of Susan Rolls Timoteo Gaul, MD.  The CCM team was consulted for assistance with disease management and care coordination needs.    Engaged with patient face to face for initial visit in response to provider referral for pharmacy case management and/or care coordination services.   Consent to Services:  The patient was given the following information about Chronic Care Management services today, agreed to services, and gave verbal consent: 1. CCM service includes personalized support from designated clinical staff supervised by the primary care provider, including individualized plan of care and coordination with other care providers 2. 24/7 contact phone numbers for assistance for urgent and routine care needs. 3. Service will only be billed when office clinical staff spend 20 minutes or more in a month to coordinate care. 4. Only one practitioner may furnish and bill the service in a calendar month. 5.The patient may stop CCM services at any time (effective at the end of the month) by phone call to the office staff. 6. The patient will be responsible for cost sharing (co-pay) of up to 20% of the service fee (after annual deductible is met). Patient agreed to services and consent obtained.  Patient Care Team: Lavera Guise, MD as PCP - General (Internal Medicine)  Recent office visits: 10/05/20 Ronnell Freshwater, NP.  For Follow-Up. No medication changes.  10/02/20 Lavera Guise, MD. For follow-Up. INCREASED/CHANED Doxycyline 50 mg to Take one cap po QD-BID. Take with food. STOPPED Multiple Vitamins.  Recent consult visits: 02/27/21 Tracie Harrier, MD. For depression. No information given. 10/16/20 Obstetrics and Gynecology Kenton Kingfisher Linton Ham, MD. Follow-Up.  STARTED Estradiol 01 mg/24 hr APPLY 1 PATCH TO SKIN  ONCE WEEKLY  10/04/20 Etta Quill, Stana Bunting, NP Constance Goltz, Malena Catholic, MD. No information given.  10/03/20 Bo Merino, MD. For Sjogrens syndrome. Per note: Discussed hand exercises. No medication changes.  09/28/20 Vas Surgen. Kris Hartmann, NP. For lower extremities. No medication changes.  09/26/20 Behavioral [name], MD. For depression. No information given.  Hospital visits: None in previous 6 months  Objective:  Lab Results  Component Value Date   CREATININE 0.87 03/06/2021   BUN 16 03/06/2021   GFRNONAA 69 03/06/2021   GFRAA 80 03/06/2021   NA 140 03/06/2021   K 4.5 03/06/2021   CALCIUM 9.6 03/06/2021   CO2 25 03/06/2021   GLUCOSE 92 03/06/2021    No results found for: HGBA1C, FRUCTOSAMINE, GFR, MICROALBUR  Last diabetic Eye exam: No results found for: HMDIABEYEEXA  Last diabetic Foot exam: No results found for: HMDIABFOOTEX   Lab Results  Component Value Date   CHOL 188 03/23/2020   HDL 48 03/23/2020   LDLCALC 118 (H) 03/23/2020   TRIG 109 03/23/2020   CHOLHDL 3.9 03/23/2020    Hepatic Function Latest Ref Rng & Units 03/06/2021 10/03/2020 08/22/2020  Total Protein 6.1 - 8.1 g/dL 7.2 7.1 7.2  Albumin 3.8 - 4.8 g/dL - - 4.4  AST 10 - 35 U/L _0 ALT 6 - 29 U/L _1 Alk Phosphatase 44 - 121 IU/L - - 79  Total Bilirubin 0.2 - 1.2 mg/dL 0.3 0.3 <0.2  Bilirubin, Direct 0.0 - 0.3 mg/dL - - -    Lab Results  Component Value Date/Time   TSH 0.122 (  L) 10/05/2020 01:02 PM   TSH 0.063 (L) 03/23/2020 09:33 AM   TSH 0.621 03/03/2019 10:40 AM   FREET4 1.54 10/05/2020 01:02 PM   FREET4 1.02 03/23/2020 09:33 AM    CBC Latest Ref Rng & Units 03/06/2021 10/05/2020 10/03/2020  WBC 3.8 - 10.8 Thousand/uL 5.1 5.5 5.1  Hemoglobin 11.7 - 15.5 g/dL 13.0 13.6 12.6  Hematocrit 35.0 - 45.0 % 39.5 40.8 38.1  Platelets 140 - 400 Thousand/uL 360 359 332    Lab Results  Component Value Date/Time   VD25OH 45.94 03/23/2020 09:33 AM   VD25OH 39.2  03/03/2019 10:40 AM    Clinical ASCVD: No  The 10-year ASCVD risk score Mikey Bussing DC Jr., et al., 2013) is: 5.3%   Values used to calculate the score:     Age: 54 years     Sex: Female     Is Non-Hispanic African American: No     Diabetic: No     Tobacco smoker: No     Systolic Blood Pressure: 161 mmHg     Is BP treated: No     HDL Cholesterol: 48 mg/dL     Total Cholesterol: 188 mg/dL    Depression screen [facility] 2/9 02/27/2021 10/05/2020 03/22/2020  Decreased Interest 0 0 0  Down, Depressed, Hopeless 0 0 0  PHQ - 2 Score 0 0 0      Social History   Tobacco Use  Smoking Status Never Smoker  Smokeless Tobacco Never Used   BP Readings from Last 3 Encounters:  03/06/21 117/79  10/16/20 120/80  10/05/20 119/74   Pulse Readings from Last 3 Encounters:  03/06/21 74  10/05/20 80  10/03/20 82   Wt Readings from Last 3 Encounters:  03/06/21 188 lb 6.4 oz (85.5 kg)  10/16/20 187 lb (84.8 kg)  10/05/20 184 lb 3.2 oz (83.6 kg)   BMI Readings from Last 3 Encounters:  03/06/21 31.11 kg/m  10/16/20 31.12 kg/m  10/05/20 30.65 kg/m    Assessment/Interventions: Review of patient past medical history, allergies, medications, health status, including review of consultants reports, laboratory and other test data, was performed as part of comprehensive evaluation and provision of chronic care management services.   SDOH:  (Social Determinants of Health) assessments and interventions performed: Yes  SDOH Screenings   Alcohol Screen: Low Risk   . Last Alcohol Screening Score (AUDIT): 0  Depression (PHQ2-9): Low Risk   . PHQ-2 Score: 0  Financial Resource Strain: Low Risk   . Difficulty of Paying Living Expenses: Not very hard  Food Insecurity: Not on file  Housing: Not on file  Physical Activity: Not on file  Social Connections: Not on file  Stress: Not on file  Tobacco Use: Low Risk   . Smoking Tobacco Use: Never Smoker  . Smokeless Tobacco Use: Never Used  Transportation  Needs: Not on file    CCM Care Plan  Allergies  Allergen Reactions  . Aspirin   . Keflex [Cephalexin] Shortness Of Breath  . Azithromycin Hives and Nausea Only  . Barley Grass   . Clarithromycin Other (See Comments)  . Metoclopramide Other (See Comments)    TREMORS Tremors Tremors   . Oat Other (See Comments)  . Oatmeal Other (See Comments)  . Other     Other reaction(s): Other (See Comments) KEOPLEX.  Marland Kitchen Rye Grass Flower Pollen Extract [Gramineae Pollens]   . Vioxx [Rofecoxib] Diarrhea  . Wheat Bran Other (See Comments)    Celiac disease  . Wheat Extract   .  Celebrex [Celecoxib] Hives  . Metoclopramide Hcl Anxiety    BODY TREMORS    Medications Reviewed Today    Reviewed by Edythe Clarity, [facility] (Pharmacist) on 03/07/21 at [phone]  Med List Status: <None>  Medication Order Taking? Sig Documenting Provider Last Dose Status Informant  acetaminophen (TYLENOL) 500 MG tablet [id number] Yes Take by mouth as needed. [provider] Taking Active   albuterol (PROVENTIL HFA;VENTOLIN HFA) 108 (90 Base) MCG/ACT inhaler [id number] Yes Inhale 2 puffs into the lungs every 6 (six) hours as needed for wheezing or shortness of breath. Lavera Guise, MD Taking Active   albuterol (PROVENTIL) (2.5 MG/3ML) 0.083% nebulizer solution [id number] Yes Take 3 mLs (2.5 mg total) by nebulization every 6 (six) hours as needed for wheezing or shortness of breath. Lavera Guise, MD Taking Active   ALPRAZolam Duanne Moron) 0.5 MG tablet [id number] Yes Take 0.5 mg by mouth 3 (three) times daily as needed. [provider] Taking Active   amitriptyline (ELAVIL) 25 MG tablet [id number] Yes Take 1 tablet (25 mg total) by mouth at bedtime. [name], MD Taking Active   Ascorbic Acid (VITAMIN C) 100 MG tablet [id number] Yes Take 100 mg by mouth daily.  [provider] Taking Active   BREO ELLIPTA 100-25 MCG/INH AEPB [id number] Yes 1 PUFF BY MOUTH ONCE DAILY Gae Dry, MD Taking Active    calcium carbonate (OS-CAL) 1250 (500 Ca) MG chewable tablet [id number] Yes Chew by mouth. [provider] Taking Active   carisoprodol (SOMA) 350 MG tablet [id number] Yes Take 350 mg by mouth at bedtime. Patient states that she takes 1/2 tablet nightly  [provider] Taking Active Self  Dentifrices (BIOTENE DRY MOUTH DT) [id number] Yes by Transmucosal route. [provider] Taking Active   doxycycline (VIBRAMYCIN) 50 MG capsule [id number] Yes Take one cap po QD-BID. Take with food.  Patient taking differently: as needed. Take one cap po QD-BID. Take with food.   Ralene Bathe, MD Taking Active   eletriptan (RELPAX) 40 MG tablet [id number] Yes Take 40 mg by mouth as needed for migraine. may repeat in 2 hours if necessary  [provider] Taking Active Self  EPINEPHRINE 0.3 mg/0.3 mL IJ SOAJ injection [id number] Yes INJECT CONTENTS OF 1 PEN AS NEEDED FOR ALLERGIC REACTION Lavera Guise, MD Taking Active   estradiol [facility]) 0.1 mg/24hr patch [id number] Yes APPLY 1 PATCH TO SKIN ONCE WEEKLY Gae Dry, MD Taking Active   ezetimibe (ZETIA) 10 MG tablet [id number] Yes TAKE 1 TABLET BY MOUTH EVERY DAY Lavera Guise, MD Taking Active   famotidine (PEPCID) 10 MG tablet [id number] Yes Take 20 mg by mouth.  [provider] Taking Active   fenofibrate (TRICOR) 145 MG tablet [id number] Yes TAKE 1 TABLET BY MOUTH EVERY DAY Lavera Guise, MD Taking Active   fluticasone [facility]) 50 MCG/ACT nasal spray [id number] Yes Place 2 sprays into both nostrils daily. Ronnell Freshwater, NP Taking Active   gabapentin (NEURONTIN) 600 MG tablet [id number] Yes Take 1/2 (one-half) tablet by mouth twice daily Ronnell Freshwater, NP Taking Active   HYDROcodone-acetaminophen (NORCO) 7.5-325 MG tablet [id number] Yes Take 1-2 tablets by mouth every 6 (six) hours as needed for moderate pain. Ronnell Freshwater, NP Taking Active   hydroxychloroquine (PLAQUENIL) 200 MG tablet [id number] Yes Take 1  tablet (200 mg total) by mouth 2 (two) times daily. Bo Merino, MD Taking Active   hyoscyamine (ANASPAZ) 0.125 MG TBDP disintergrating tablet [id number] Yes Place  0.125 mg under the tongue every 4 (four) hours as needed for bladder spasms.  [provider] Taking Active Self  Lactobacillus (DIGESTIVE HEALTH PROBIOTIC PO) [id number] Yes Take 1 tablet by mouth daily.  [provider] Taking Active Self  levothyroxine (SYNTHROID) 137 MCG tablet [id number] Yes Take 1 tablet (137 mcg total) by mouth daily before breakfast. Ronnell Freshwater, NP Taking Active   LINZESS 290 MCG CAPS capsule [id number] Yes Take 290 mcg by mouth daily.  [provider] Taking Active   Magnesium Oxide (NATRUL MAGNESIUM PO) [id number] Yes Take by mouth.  [provider] Taking Active   montelukast (SINGULAIR) 10 MG tablet [id number] Yes TAKE ONE TABLET BY MOUTH ONCE DAILY FOR ASTHMA Ronnell Freshwater, NP Taking Active   Multiple Vitamins-Minerals (ZINC PO) [id number] Yes Take by mouth daily. [provider] Taking Active   mupirocin ointment (BACTROBAN) 2 % [id number] Yes as needed.  [provider] Taking Active   pantoprazole (PROTONIX) 40 MG tablet [id number] Yes Take 40 mg by mouth daily.  [provider] Taking Active Self  Polyethyl Glycol-Propyl Glycol 0.4-0.3 % SOLN [id number] Yes Apply to eye.  [provider] Taking Active   PRESCRIPTION MEDICATION [id number] Yes 1 Syringe by Subdermal route once a week. Allergy shot  [provider] Taking Active Self  Probiotic Product (Anegam) CAPS [id number] Yes Take by mouth.  [provider] Taking Active   sodium chloride (OCEAN) 0.65 % SOLN nasal spray [id number] Yes Place 1 spray into both nostrils as needed for congestion.  [provider] Taking Active Self  sodium chloride 0.9 % nebulizer solution [id number] Yes  [provider] Taking Active    SUMAtriptan-naproxen (TREXIMET) 85-500 MG tablet [id number] Yes daily as needed.  [provider] Taking Active   Tuberculin-Allergy Syringes 28G X 1/2" 1 ML MISC [id number] Yes 1 each by Does not apply route every 14 (fourteen) days. For allergy injections Ronnell Freshwater, NP Taking Active   Vaginal Lubricant (REPLENS) GEL [id number] Yes Place vaginally.  [provider] Taking Active   venlafaxine XR (EFFEXOR-XR) 150 MG 24 hr capsule [id number] Yes Take 2 capsules (300 mg total) by mouth at bedtime. [name], MD Taking Active   Vitamin D, Ergocalciferol, (DRISDOL) 1.25 MG (50000 UNIT) CAPS capsule [id number] Yes TAKE 1 CAPSULE (50,000 UNITS TOTAL) BY MOUTH EVERY 7 (SEVEN) DAYS. MONDAY Ronnell Freshwater, NP Taking Active   vitamin E 1000 UNIT capsule [id number] Yes Take by mouth.  [provider] Taking Active   White Petrolatum-Mineral Oil ([place]) 0.1-0.1 % OINT [id number] Yes  [provider] Taking Active           Patient Active Problem List   Diagnosis Date Noted  . Lymphadenopathy of head and neck 11/04/2020  . Screening for osteoporosis 11/04/2020  . Need for vaccination against Streptococcus pneumoniae using pneumococcal conjugate vaccine 13 11/04/2020  . Menopause 10/16/2020  . 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 Essential tremor 06/20/2020  . OSA (obstructive sleep apnea) 03/26/2020  . Chronic wrist pain, right 11/02/2019  . Chronic pain of left wrist 11/02/2019  . Pain in joints of right hand 11/02/2019  . Pain in joint of left hand 11/02/2019  . Primary osteoarthritis of both hands 11/02/2019  . MDD (major depressive disorder), recurrent, in partial remission ([place]) 10/05/2019  . Dysuria 04/03/2019  . Generalized anxiety disorder 02/28/2019  . Chronic pain of both knees 03/01/2018  .  Encounter for general adult medical examination with abnormal findings 03/01/2018  .  Primary osteoarthritis of both first carpometacarpal joints 03/01/2018  . Chronic obstructive pulmonary disease, unspecified ([place]) 12/04/2017  . Shortness of breath 12/04/2017  . Allergic rhinitis, unspecified 12/04/2017  . 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 Vaginal atrophy 09/01/2017  . Chronic right-sided low back pain without sciatica 01/05/2017  . Primary Sjogren's syndrome ([place]) 01/05/2017  . CD (celiac disease) 04/17/2015  . Gastroesophageal reflux disease with esophagitis 04/17/2015  . Hx of Sjogren's disease ([place]) 04/17/2015  . Acute asthma exacerbation 10/14/2013  . Acute respiratory failure ([place]) 10/14/2013  . Gastritis 10/08/2013  . Bradycardia 10/07/2013  . Esophageal spasm 10/06/2013  . Substernal precordial chest pain 10/06/2013  . Chronic anxiety 10/06/2013  . GERD (gastroesophageal reflux disease) 10/06/2013  . Chronic asthma 10/06/2013  . 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 Depression with anxiety 10/06/2013  . Fibromyalgia 10/06/2013  . Hypothyroidism 10/06/2013  . Chronic migraine 10/06/2013    Immunization History  Administered Date(s) Administered  . Influenza Inj Mdck Quad Pf 07/22/2018, 07/05/2019, 06/27/2020  . Influenza-Unspecified 08/17/2017  . Moderna Sars-Covid-2 Vaccination 12/03/2019, 01/24/2020, 08/13/2020  . Tdap 07/27/2019  . Zoster Recombinat (Shingrix) 08/21/2017, 12/08/2017    Conditions to be addressed/monitored:  Chronic Migraines, Asthma, COPD, Allergic Rhinitis, GERD, Hypothyroidism, Depression/Anxiety, Osteoporosis, Sjogren's Syndrome, HLD  Care Plan : General Pharmacy (Adult)  Updates made by Edythe Clarity, RPH since 03/07/2021 12:00 AM    Problem: Chronic Migraines, Asthma, COPD, Allergic Rhinitis, GERD, Hypothyroidism, Depression/Anxiety, Osteoporosis, Sjogren's Syndrome, HLD   Priority: High  Onset Date: 03/06/2021    Goal: Patient-Specific Goal   Note:   Current Barriers:   . Unable to independently monitor therapeutic efficacy . Unable to achieve control of cholesterol, TSH   Pharmacist Clinical Goal(s):  Marland Kitchen Patient will achieve adherence to monitoring guidelines and medication adherence to achieve therapeutic efficacy . achieve control of cholesterol/TSH as evidenced by routine labwork . contact provider office for questions/concerns as evidenced notation of same in electronic health record through collaboration with PharmD and provider.   Interventions: . 1:1 collaboration with Ronnell Freshwater, NP regarding development and update of comprehensive plan of care as evidenced by provider attestation and co-signature . Inter-disciplinary care team collaboration (see longitudinal plan of care) . Comprehensive medication review performed; medication list updated in electronic medical record  Hyperlipidemia: (LDL goal < 100) -Uncontrolled -Current treatment: . Zetia 63m daily . Fenofibrate 1476mdaily -Medications previously tried: atorvastatin  -Reviewed most recent lipid panel from May 2021 -Educated on Cholesterol goals;  Benefits of statin for ASCVD risk reduction; Importance of limiting foods high in cholesterol; Exercise goal of 150 minutes per week; -Recommended to continue current medication Recommended repeat lipid panel, if LDL still elevated would consider statin medication  COPD (Goal: control symptoms and prevent exacerbations) -Controlled -Current treatment  . Breo Ellipta 100/2533mone puff once daily . Albuterol 2.5/3ml3mbulizers prn . Albuterol HFA 90 mcg prn -Medications previously tried: Dymista   -Exacerbations requiring treatment in last 6 months: none -Patient reports consistent use of maintenance inhaler -Frequency of rescue inhaler use: as needed only -Counseled on Proper inhaler technique; Benefits of consistent maintenance inhaler use When to use rescue inhaler Differences between maintenance and rescue inhalers   Reports breathing has been controlled lately -Recommended to continue current medication Assessed patient finances. Patient given application for Breo PAP program as her medications are cumulatively very high.  Hypothyroidism (Goal: Maintain  TSH) -Uncontrolled -Current treatment  . Levothyroxine 137 mcg daily -Medications previously tried: none ntoed -Most recent TSH is low - from 12/202, the dose of her thyroid medication was never changed  -Recommended to continue current medication Recommended recheck TSH upcoming physical and adjust medication accordingly   Migraines (Goal: Minimize symptoms) -Controlled -Current treatment  . Eletriptan 32m prm  . Treximet 85-5090mdaily prn -Medications previously tried: none noted -Reports she uses the Treximet for more sever migraines that initiate from her stomach? -Treximet copay is expensive, however I believe she is getting it for the best price possible  -Recommended to continue current medication  Allergic Rhinitis (Goal: Minimize symptoms) -Controlled -Current treatment  . Montelukast 1035maily . Flonase 72m58mrn . Sodium Chloride 0.9% nasal spray -Medications previously tried: none noted  -Allergy symptoms currently controlled -Recommended to continue current medication  Health Maintenance Majority of today's visit spent helping patient look for more affordable options for her medication.  We went through each medication and tried to find the most cost efficient way for patient to get it.  Some of this was through [employer] some through other various discount programs.  Other chronic conditions to be discussed in detail at follow up visits.    Patient Goals/Self-Care Activities . Patient will:  - take medications as prescribed check blood pressure weekly, document, and provide at future appointments collaborate with provider on medication access solutions  Follow Up Plan: The care management  team will reach out to the patient again over the next 120 days.        Medication Assistance: None required.  Patient affirms current coverage meets needs.  Patient's preferred pharmacy is:  [id number] [place] [facility] [id number]: [phone]/pharmacy #[id number], [place] - [address] [id number] [address] [phone] [place] [address]: [phone] pill box? Yes Pt endorses 100% compliance  We discussed: Benefits of medication synchronization, packaging and delivery as well as enhanced pharmacist oversight with [employer]. Patient decided to: Continue current medication management strategy  Care Plan and Follow Up Patient Decision:  Patient agrees to Care Plan and Follow-up.  Plan: The care management team will reach out to the patient again over the next 120 days.  ChristBeverly MilchmD Clinical Pharmacist [facility] [place] [phone]

## 2021-03-06 ENCOUNTER — Other Ambulatory Visit: Payer: Self-pay

## 2021-03-06 ENCOUNTER — Ambulatory Visit (INDEPENDENT_AMBULATORY_CARE_PROVIDER_SITE_OTHER): Payer: Medicare HMO

## 2021-03-06 ENCOUNTER — Encounter: Payer: Self-pay | Admitting: Physician Assistant

## 2021-03-06 ENCOUNTER — Ambulatory Visit: Payer: Medicare HMO

## 2021-03-06 ENCOUNTER — Ambulatory Visit: Payer: Medicare HMO | Admitting: Pharmacist

## 2021-03-06 ENCOUNTER — Ambulatory Visit: Payer: Medicare HMO | Admitting: Physician Assistant

## 2021-03-06 VITALS — BP 117/79 | HR 74 | Resp 16 | Ht 65.25 in | Wt 188.4 lb

## 2021-03-06 DIAGNOSIS — E039 Hypothyroidism, unspecified: Secondary | ICD-10-CM

## 2021-03-06 DIAGNOSIS — J449 Chronic obstructive pulmonary disease, unspecified: Secondary | ICD-10-CM

## 2021-03-06 DIAGNOSIS — J301 Allergic rhinitis due to pollen: Secondary | ICD-10-CM | POA: Diagnosis not present

## 2021-03-06 DIAGNOSIS — M19071 Primary osteoarthritis, right ankle and foot: Secondary | ICD-10-CM

## 2021-03-06 DIAGNOSIS — M19041 Primary osteoarthritis, right hand: Secondary | ICD-10-CM

## 2021-03-06 DIAGNOSIS — M170 Bilateral primary osteoarthritis of knee: Secondary | ICD-10-CM | POA: Diagnosis not present

## 2021-03-06 DIAGNOSIS — M3509 Sicca syndrome with other organ involvement: Secondary | ICD-10-CM

## 2021-03-06 DIAGNOSIS — G894 Chronic pain syndrome: Secondary | ICD-10-CM

## 2021-03-06 DIAGNOSIS — K900 Celiac disease: Secondary | ICD-10-CM | POA: Diagnosis not present

## 2021-03-06 DIAGNOSIS — M19042 Primary osteoarthritis, left hand: Secondary | ICD-10-CM

## 2021-03-06 DIAGNOSIS — Z8719 Personal history of other diseases of the digestive system: Secondary | ICD-10-CM | POA: Diagnosis not present

## 2021-03-06 DIAGNOSIS — M797 Fibromyalgia: Secondary | ICD-10-CM

## 2021-03-06 DIAGNOSIS — Z79899 Other long term (current) drug therapy: Secondary | ICD-10-CM | POA: Diagnosis not present

## 2021-03-06 DIAGNOSIS — Z8261 Family history of arthritis: Secondary | ICD-10-CM

## 2021-03-06 DIAGNOSIS — Z85850 Personal history of malignant neoplasm of thyroid: Secondary | ICD-10-CM

## 2021-03-06 DIAGNOSIS — M19072 Primary osteoarthritis, left ankle and foot: Secondary | ICD-10-CM

## 2021-03-06 DIAGNOSIS — K224 Dyskinesia of esophagus: Secondary | ICD-10-CM | POA: Diagnosis not present

## 2021-03-06 DIAGNOSIS — Z8709 Personal history of other diseases of the respiratory system: Secondary | ICD-10-CM

## 2021-03-06 DIAGNOSIS — F3341 Major depressive disorder, recurrent, in partial remission: Secondary | ICD-10-CM

## 2021-03-06 DIAGNOSIS — F411 Generalized anxiety disorder: Secondary | ICD-10-CM

## 2021-03-07 NOTE — Patient Instructions (Addendum)
Visit Information  Goals Addressed            This Visit's Progress   . Manage My Medicine       Timeframe:  Long-Range Goal Priority:  High Start Date:  03/07/2021                           Expected End Date:   09/07/2021                    Follow Up Date 06/26/21   - keep a list of all the medicines I take; vitamins and herbals too - use a pillbox to sort medicine - use an alarm clock or phone to remind me to take my medicine    Why is this important?   . These steps will help you keep on track with your medicines.   Notes: Work towards finding more affordable access to medications.      Patient Care Plan: General Pharmacy (Adult)    Problem Identified: Chronic Migraines, Asthma, COPD, Allergic Rhinitis, GERD, Hypothyroidism, Depression/Anxiety, Osteoporosis, Sjogren's Syndrome, HLD   Priority: High  Onset Date: 03/06/2021    Goal: Patient-Specific Goal   Note:   Current Barriers:  . Unable to independently monitor therapeutic efficacy . Unable to achieve control of cholesterol, TSH   Pharmacist Clinical Goal(s):  Marland Kitchen Patient will achieve adherence to monitoring guidelines and medication adherence to achieve therapeutic efficacy . achieve control of cholesterol/TSH as evidenced by routine labwork . contact provider office for questions/concerns as evidenced notation of same in electronic health record through collaboration with PharmD and provider.   Interventions: . 1:1 collaboration with Ronnell Freshwater, NP regarding development and update of comprehensive plan of care as evidenced by provider attestation and co-signature . Inter-disciplinary care team collaboration (see longitudinal plan of care) . Comprehensive medication review performed; medication list updated in electronic medical record  Hyperlipidemia: (LDL goal < 100) -Uncontrolled -Current treatment: . Zetia 10m daily . Fenofibrate 1421mdaily -Medications previously tried: atorvastatin  -Reviewed  most recent lipid panel from May 2021 -Educated on Cholesterol goals;  Benefits of statin for ASCVD risk reduction; Importance of limiting foods high in cholesterol; Exercise goal of 150 minutes per week; -Recommended to continue current medication Recommended repeat lipid panel, if LDL still elevated would consider statin medication  COPD (Goal: control symptoms and prevent exacerbations) -Controlled -Current treatment  . Breo Ellipta 100/2535mone puff once daily . Albuterol 2.5/3ml19mbulizers prn . Albuterol HFA 90 mcg prn -Medications previously tried: Dymista   -Exacerbations requiring treatment in last 6 months: none -Patient reports consistent use of maintenance inhaler -Frequency of rescue inhaler use: as needed only -Counseled on Proper inhaler technique; Benefits of consistent maintenance inhaler use When to use rescue inhaler Differences between maintenance and rescue inhalers  Reports breathing has been controlled lately -Recommended to continue current medication Assessed patient finances. Patient given application for Breo PAP program as her medications are cumulatively very high.  Hypothyroidism (Goal: Maintain TSH) -Uncontrolled -Current treatment  . Levothyroxine 137 mcg daily -Medications previously tried: none ntoed -Most recent TSH is low - from 12/202, the dose of her thyroid medication was never changed  -Recommended to continue current medication Recommended recheck TSH upcoming physical and adjust medication accordingly   Migraines (Goal: Minimize symptoms) -Controlled -Current treatment  . Eletriptan 40mg33m  . Treximet 85-500mg 56my prn -Medications previously tried: none noted -Reports she uses the Treximet for  more sever migraines that initiate from her stomach? -Treximet copay is expensive, however I believe she is getting it for the best price possible  -Recommended to continue current medication  Allergic Rhinitis (Goal: Minimize  symptoms) -Controlled -Current treatment  . Montelukast 29m daily . Flonase 553m prn . Sodium Chloride 0.9% nasal spray -Medications previously tried: none noted  -Allergy symptoms currently controlled -Recommended to continue current medication  Health Maintenance Majority of today's visit spent helping patient look for more affordable options for her medication.  We went through each medication and tried to find the most cost efficient way for patient to get it.  Some of this was through [employer] some through other various discount programs.  Other chronic conditions to be discussed in detail at follow up visits.    Patient Goals/Self-Care Activities . Patient will:  - take medications as prescribed check blood pressure weekly, document, and provide at future appointments collaborate with provider on medication access solutions  Follow Up Plan: The care management team will reach out to the patient again over the next 120 days.       Ms. GaHouptas given information about Chronic Care Management services today including:  1. CCM service includes personalized support from designated clinical staff supervised by her physician, including individualized plan of care and coordination with other care providers 2. 24/7 contact phone numbers for assistance for urgent and routine care needs. 3. Standard insurance, coinsurance, copays and deductibles apply for chronic care management only during months in which we provide at least 20 minutes of these services. Most insurances cover these services at 100%, however patients may be responsible for any copay, coinsurance and/or deductible if applicable. This service may help you avoid the need for more expensive face-to-face services. 4. Only one practitioner may furnish and bill the service in a calendar month. 5. The patient may stop CCM services at any time (effective at the end of the month) by phone call to the  office staff.  Patient agreed to services and verbal consent obtained.   The patient verbalized understanding of instructions, educational materials, and care plan provided today and agreed to receive a mailed copy of patient instructions, educational materials, and care plan.  Telephone follow up appointment with pharmacy team member scheduled for: 4 months  [facility] and Physical Activity Chronic obstructive pulmonary disease (COPD) is a long-term (chronic) condition that affects the lungs. COPD is a general term that can be used to describe many different lung problems that cause lung swelling (inflammation) and limit airflow, including chronic bronchitis and emphysema. The main symptom of COPD is shortness of breath, which makes it harder to do even simple tasks. This can also make it harder to exercise and be active. Talk with your health care provider about treatments to help you breathe better and actions you can take to prevent breathing problems during physical activity. What are the benefits of exercising with COPD? Exercising regularly is an important part of a healthy lifestyle. You can still exercise and do physical activities even though you have COPD. Exercise and physical activity improve your shortness of breath by increasing blood flow (circulation). This causes your heart to pump more oxygen through your body. Moderate exercise can improve your:  Oxygen use.  Energy level.  Shortness of breath.  Strength in your breathing muscles.  Heart health.  Sleep.  Self-esteem and feelings of self-worth.  Depression, stress, and anxiety levels. Exercise can benefit everyone  with COPD. The severity of your disease may affect how hard you can exercise, especially at first, but everyone can benefit. Talk with your health care provider about how much exercise is safe for you, and which activities and exercises are safe for you.   What actions can I take to  prevent breathing problems during physical activity?  Sign up for a pulmonary rehabilitation program. This type of program may include: ? Education about lung diseases. ? Exercise classes that teach you how to exercise and be more active while improving your breathing. This usually involves:  Exercise using your lower extremities, such as a stationary bicycle.  About 30 minutes of exercise, 2 to 5 times per week, for 6 to 12 weeks  Strength training, such as push ups or leg lifts. ? Nutrition education. ? Group classes in which you can talk with others who also have COPD and learn ways to manage stress.  If you use an oxygen tank, you should use it while you exercise. Work with your health care provider to adjust your oxygen for your physical activity. Your resting flow rate is different from your flow rate during physical activity.  While you are exercising: ? Take slow breaths. ? Pace yourself and do not try to go too fast. ? Purse your lips while breathing out. Pursing your lips is similar to a kissing or whistling position. ? If doing exercise that uses a quick burst of effort, such as weight lifting:  Breathe in before starting the exercise.  Breathe out during the hardest part of the exercise (such as raising the weights). Where to find support You can find support for exercising with COPD from:  Your health care provider.  A pulmonary rehabilitation program.  Your local health department or community health programs.  Support groups, online or in-person. Your health care provider may be able to recommend support groups. Where to find more information You can find more information about exercising with COPD from:  American Lung Association: ClassInsider.se.  COPD Foundation: https://www.rivera.net/. Contact a health care provider if:  Your symptoms get worse.  You have chest pain.  You have nausea.  You have a fever.  You have trouble talking or catching your  breath.  You want to start a new exercise program or a new activity. Summary  COPD is a general term that can be used to describe many different lung problems that cause lung swelling (inflammation) and limit airflow. This includes chronic bronchitis and emphysema.  Exercise and physical activity improve your shortness of breath by increasing blood flow (circulation). This causes your heart to provide more oxygen to your body.  Contact your health care provider before starting any exercise program or new activity. Ask your health care provider what exercises and activities are safe for you. This information is not intended to replace advice given to you by your health care provider. Make sure you discuss any questions you have with your health care provider. Document Revised: 02/02/2019 Document Reviewed: 11/05/2017 Elsevier Patient Education  2021 [employer].

## 2021-03-08 LAB — URINALYSIS, ROUTINE W REFLEX MICROSCOPIC
Bilirubin Urine: NEGATIVE
Glucose, UA: NEGATIVE
Hgb urine dipstick: NEGATIVE
Ketones, ur: NEGATIVE
Leukocytes,Ua: NEGATIVE
Nitrite: NEGATIVE
Protein, ur: NEGATIVE
Specific Gravity, Urine: 1.012 (ref 1.001–1.035)
pH: 6.5 (ref 5.0–8.0)

## 2021-03-08 LAB — COMPLETE METABOLIC PANEL WITH GFR
AG Ratio: 1.7 (calc) (ref 1.0–2.5)
ALT: 27 U/L (ref 6–29)
AST: 27 U/L (ref 10–35)
Albumin: 4.5 g/dL (ref 3.6–5.1)
Alkaline phosphatase (APISO): 62 U/L (ref 37–153)
BUN: 16 mg/dL (ref 7–25)
CO2: 25 mmol/L (ref 20–32)
Calcium: 9.6 mg/dL (ref 8.6–10.4)
Chloride: 105 mmol/L (ref 98–110)
Creat: 0.87 mg/dL (ref 0.50–0.99)
GFR, Est African American: 80 mL/min/{1.73_m2} (ref >=60)
GFR, Est Non African American: 69 mL/min/{1.73_m2} (ref >=60)
Globulin: 2.7 g/dL (calc) (ref 1.9–3.7)
Glucose, Bld: 92 mg/dL (ref 65–99)
Potassium: 4.5 mmol/L (ref 3.5–5.3)
Sodium: 140 mmol/L (ref 135–146)
Total Bilirubin: 0.3 mg/dL (ref 0.2–1.2)
Total Protein: 7.2 g/dL (ref 6.1–8.1)

## 2021-03-08 LAB — RHEUMATOID FACTOR: Rheumatoid fact SerPl-aCnc: <14 IU/mL (ref <14)

## 2021-03-08 LAB — CBC WITH DIFFERENTIAL/PLATELET
Absolute Monocytes: 428 cells/uL (ref 200–950)
Basophils Absolute: 31 cells/uL (ref 0–200)
Basophils Relative: 0.6 %
Eosinophils Absolute: 219 cells/uL (ref 15–500)
Eosinophils Relative: 4.3 %
HCT: 39.5 % (ref 35.0–45.0)
Hemoglobin: 13 g/dL (ref 11.7–15.5)
Lymphs Abs: 1586 cells/uL (ref 850–3900)
MCH: 29 pg (ref 27.0–33.0)
MCHC: 32.9 g/dL (ref 32.0–36.0)
MCV: 88.2 fL (ref 80.0–100.0)
MPV: 9.4 fL (ref 7.5–12.5)
Monocytes Relative: 8.4 %
Neutro Abs: 2836 cells/uL (ref 1500–7800)
Neutrophils Relative %: 55.6 %
Platelets: 360 10*3/uL (ref 140–400)
RBC: 4.48 10*6/uL (ref 3.80–5.10)
RDW: 12.9 % (ref 11.0–15.0)
Total Lymphocyte: 31.1 %
WBC: 5.1 10*3/uL (ref 3.8–10.8)

## 2021-03-08 LAB — SJOGRENS SYNDROME-B EXTRACTABLE NUCLEAR ANTIBODY: SSB (La) (ENA) Antibody, IgG: <1 AI

## 2021-03-08 LAB — PROTEIN ELECTROPHORESIS, SERUM, WITH REFLEX
Albumin ELP: 4.2 g/dL (ref 3.8–4.8)
Alpha 1: 0.3 g/dL (ref 0.2–0.3)
Alpha 2: 0.6 g/dL (ref 0.5–0.9)
Beta 2: 0.4 g/dL (ref 0.2–0.5)
Beta Globulin: 0.5 g/dL (ref 0.4–0.6)
Gamma Globulin: 1.3 g/dL (ref 0.8–1.7)
Total Protein: 7.4 g/dL (ref 6.1–8.1)

## 2021-03-08 LAB — ANTI-NUCLEAR AB-TITER (ANA TITER): ANA Titer 1: 1:160 {titer} — ABNORMAL HIGH

## 2021-03-08 LAB — SJOGRENS SYNDROME-A EXTRACTABLE NUCLEAR ANTIBODY: SSA (Ro) (ENA) Antibody, IgG: <1 AI

## 2021-03-08 LAB — ANA: Anti Nuclear Antibody (ANA): POSITIVE — AB

## 2021-03-08 NOTE — Progress Notes (Signed)
CBC and CMP WNL.  UA normal.  RF, Ro, La antibodies are negative.  SPEP did not reveal any abnormal proteins.   ANA pending.

## 2021-03-10 NOTE — Progress Notes (Signed)
ANA is positive at low titer.  No change in treatment advised.

## 2021-03-12 ENCOUNTER — Telehealth: Payer: Self-pay

## 2021-03-12 DIAGNOSIS — J301 Allergic rhinitis due to pollen: Secondary | ICD-10-CM | POA: Diagnosis not present

## 2021-03-19 DIAGNOSIS — G250 Essential tremor: Secondary | ICD-10-CM | POA: Diagnosis not present

## 2021-03-19 DIAGNOSIS — M797 Fibromyalgia: Secondary | ICD-10-CM | POA: Diagnosis not present

## 2021-03-19 DIAGNOSIS — G4700 Insomnia, unspecified: Secondary | ICD-10-CM | POA: Diagnosis not present

## 2021-03-19 DIAGNOSIS — G43019 Migraine without aura, intractable, without status migrainosus: Secondary | ICD-10-CM | POA: Diagnosis not present

## 2021-03-23 DIAGNOSIS — G4733 Obstructive sleep apnea (adult) (pediatric): Secondary | ICD-10-CM | POA: Diagnosis not present

## 2021-03-26 ENCOUNTER — Ambulatory Visit (INDEPENDENT_AMBULATORY_CARE_PROVIDER_SITE_OTHER): Payer: Medicare HMO | Admitting: Nurse Practitioner

## 2021-03-26 ENCOUNTER — Other Ambulatory Visit: Payer: Self-pay

## 2021-03-26 ENCOUNTER — Encounter: Payer: Self-pay | Admitting: Nurse Practitioner

## 2021-03-26 VITALS — BP 124/88 | HR 77 | Temp 98.6°F | Resp 16 | Ht 65.0 in | Wt 178.6 lb

## 2021-03-26 DIAGNOSIS — E7841 Elevated Lipoprotein(a): Secondary | ICD-10-CM | POA: Diagnosis not present

## 2021-03-26 DIAGNOSIS — R300 Dysuria: Secondary | ICD-10-CM

## 2021-03-26 DIAGNOSIS — Z0001 Encounter for general adult medical examination with abnormal findings: Secondary | ICD-10-CM | POA: Diagnosis not present

## 2021-03-26 DIAGNOSIS — M797 Fibromyalgia: Secondary | ICD-10-CM

## 2021-03-26 DIAGNOSIS — J9600 Acute respiratory failure, unspecified whether with hypoxia or hypercapnia: Secondary | ICD-10-CM | POA: Diagnosis not present

## 2021-03-26 DIAGNOSIS — R5383 Other fatigue: Secondary | ICD-10-CM

## 2021-03-26 DIAGNOSIS — M19041 Primary osteoarthritis, right hand: Secondary | ICD-10-CM

## 2021-03-26 DIAGNOSIS — K219 Gastro-esophageal reflux disease without esophagitis: Secondary | ICD-10-CM | POA: Diagnosis not present

## 2021-03-26 DIAGNOSIS — M19042 Primary osteoarthritis, left hand: Secondary | ICD-10-CM | POA: Diagnosis not present

## 2021-03-26 DIAGNOSIS — J309 Allergic rhinitis, unspecified: Secondary | ICD-10-CM

## 2021-03-26 DIAGNOSIS — J4540 Moderate persistent asthma, uncomplicated: Secondary | ICD-10-CM

## 2021-03-26 DIAGNOSIS — E559 Vitamin D deficiency, unspecified: Secondary | ICD-10-CM

## 2021-03-26 DIAGNOSIS — E039 Hypothyroidism, unspecified: Secondary | ICD-10-CM | POA: Diagnosis not present

## 2021-03-26 DIAGNOSIS — Z13820 Encounter for screening for osteoporosis: Secondary | ICD-10-CM

## 2021-03-26 DIAGNOSIS — J449 Chronic obstructive pulmonary disease, unspecified: Secondary | ICD-10-CM | POA: Diagnosis not present

## 2021-03-26 DIAGNOSIS — F411 Generalized anxiety disorder: Secondary | ICD-10-CM

## 2021-03-26 DIAGNOSIS — R69 Illness, unspecified: Secondary | ICD-10-CM | POA: Diagnosis not present

## 2021-03-26 DIAGNOSIS — F3342 Major depressive disorder, recurrent, in full remission: Secondary | ICD-10-CM

## 2021-03-26 NOTE — Progress Notes (Signed)
[facility] [place], [place] [address]  Internal MEDICINE  Office Visit Note  Patient Name: Susan [name]  [id number]  [id number]  Date of Service: 03/31/2021  Chief Complaint  Patient presents with  . Medicare Wellness    Med refills    HPI Susan [name] presents for an annual well visit and physical exam. she has a history of anxiety, arthritis, asthma, thyroid cancer s/p total thyroidectomy, COPD, depression, GERD, hyperlipidemia, fibromyalgia, hypothyroidism. She lives at home with husband who is retired. She is on disability and no longer able to work. She was a Government social research officer for [employer] She has received the first 2 doses of the COVID vaccine and wants to get the first booster injection. Her mammogram is due in December this year. Her screening colonoscopy is due in 2029. She is due for a bone density scan. She does not need any refills at this time -arthritis is stable and manageable with current medications. -asthma is manageable on current medications, patient does not want to change or adjust any medications at this time.  -depression is in remission, currently taking venlafaxine and amitriptyline. -She is currently taking ezetimibe for elevated LDL at 118 in may 2021. Need to check lipid profile to see if there has been any improvement.  -Lab results from Mar 06, 2021 reviewed with patient: CMP and CBC were normal. Positive ANA titer.  -taking 137 mcg of levothyroxine for acquired hypothyroidism s/p thyroidectomy. No recent thyroid panel has been drawn.  -taking 50,000 units of vitamin D weekly, no recent serum vitamin D level.  -anxiety is well-controlled with current medications per patient.    Current Medication: Outpatient Encounter Medications as of 03/26/2021  Medication Sig  . acetaminophen (TYLENOL) 500 MG tablet Take by mouth as needed.  Marland Kitchen albuterol (PROVENTIL HFA;VENTOLIN HFA) 108 (90 Base) MCG/ACT inhaler Inhale 2 puffs into the lungs every 6  (six) hours as needed for wheezing or shortness of breath.  Marland Kitchen albuterol (PROVENTIL) (2.5 MG/3ML) 0.083% nebulizer solution Take 3 mLs (2.5 mg total) by nebulization every 6 (six) hours as needed for wheezing or shortness of breath.  . ALPRAZolam (XANAX) 0.5 MG tablet Take 0.5 mg by mouth 3 (three) times daily as needed.  Marland Kitchen amitriptyline (ELAVIL) 25 MG tablet Take 1 tablet (25 mg total) by mouth at bedtime.  . Ascorbic Acid (VITAMIN C) 100 MG tablet Take 100 mg by mouth daily.   Marland Kitchen BREO ELLIPTA 100-25 MCG/INH AEPB 1 PUFF BY MOUTH ONCE DAILY  . calcium carbonate (OS-CAL) 1250 (500 Ca) MG chewable tablet Chew by mouth.  . carisoprodol (SOMA) 350 MG tablet Take 350 mg by mouth at bedtime. Patient states that she takes 1/2 tablet nightly   . Dentifrices (BIOTENE DRY MOUTH DT) by Transmucosal route.  . eletriptan (RELPAX) 40 MG tablet Take 40 mg by mouth as needed for migraine. may repeat in 2 hours if necessary   . EPINEPHRINE 0.3 mg/0.3 mL IJ SOAJ injection INJECT CONTENTS OF 1 PEN AS NEEDED FOR ALLERGIC REACTION  . estradiol (CLIMARA) 0.1 mg/24hr patch APPLY 1 PATCH TO SKIN ONCE WEEKLY  . ezetimibe (ZETIA) 10 MG tablet TAKE 1 TABLET BY MOUTH EVERY DAY  . famotidine (PEPCID) 10 MG tablet Take 20 mg by mouth.   . fenofibrate (TRICOR) 145 MG tablet TAKE 1 TABLET BY MOUTH EVERY DAY  . fluticasone (FLONASE) 50 MCG/ACT nasal spray Place 2 sprays into both nostrils daily.  Marland Kitchen gabapentin (NEURONTIN) 600 MG tablet Take 1/2 (one-half) tablet  by mouth twice daily  . HYDROcodone-acetaminophen (NORCO) 7.5-325 MG tablet Take 1-2 tablets by mouth every 6 (six) hours as needed for moderate pain.  . hydroxychloroquine (PLAQUENIL) 200 MG tablet Take 1 tablet (200 mg total) by mouth 2 (two) times daily.  . hyoscyamine (ANASPAZ) 0.125 MG TBDP disintergrating tablet Place 0.125 mg under the tongue every 4 (four) hours as needed for bladder spasms.   . Lactobacillus (DIGESTIVE HEALTH PROBIOTIC PO) Take 1 tablet by mouth  daily.   Marland Kitchen levothyroxine (SYNTHROID) 137 MCG tablet Take 1 tablet (137 mcg total) by mouth daily before breakfast.  . LINZESS 290 MCG CAPS capsule Take 290 mcg by mouth daily.   . Magnesium Oxide (NATRUL MAGNESIUM PO) Take by mouth.   . montelukast (SINGULAIR) 10 MG tablet TAKE ONE TABLET BY MOUTH ONCE DAILY FOR ASTHMA  . Multiple Vitamins-Minerals (ZINC PO) Take by mouth daily.  . mupirocin ointment (BACTROBAN) 2 % as needed.   . pantoprazole (PROTONIX) 40 MG tablet Take 40 mg by mouth daily.   Vladimir Faster Glycol-Propyl Glycol 0.4-0.3 % SOLN Apply to eye.   Marland Kitchen PRESCRIPTION MEDICATION 1 Syringe by Subdermal route once a week. Allergy shot   . Probiotic Product ([place]) CAPS Take by mouth.   . sodium chloride (OCEAN) 0.65 % SOLN nasal spray Place 1 spray into both nostrils as needed for congestion.   . sodium chloride 0.9 % nebulizer solution   . SUMAtriptan-naproxen (TREXIMET) 85-500 MG tablet daily as needed.   . Tuberculin-Allergy Syringes 28G X 1/2" 1 ML MISC 1 each by Does not apply route every 14 (fourteen) days. For allergy injections  . Vaginal Lubricant (REPLENS) GEL Place vaginally.   Derrill Memo ON 05/21/2021] venlafaxine XR (EFFEXOR-XR) 150 MG 24 hr capsule Take 2 capsules (300 mg total) by mouth at bedtime.  . Vitamin D, Ergocalciferol, (DRISDOL) 1.25 MG (50000 UNIT) CAPS capsule TAKE 1 CAPSULE (50,000 UNITS TOTAL) BY MOUTH EVERY 7 (SEVEN) DAYS. MONDAY  . vitamin E 1000 UNIT capsule Take by mouth.   Marland Kitchen White Petrolatum-Mineral Oil ([place] PETROL-MINERAL OIL-LANOLIN) 0.1-0.1 % OINT   . [DISCONTINUED] doxycycline (VIBRAMYCIN) 50 MG capsule Take one cap po QD-BID. Take with food. (Patient taking differently: as needed. Take one cap po QD-BID. Take with food.)   No facility-administered encounter medications on file as of 03/26/2021.    Surgical History: Past Surgical History:  Procedure Laterality Date  . ABDOMINAL HYSTERECTOMY  2006  . BLADDER REPAIR    . BREAST CYST  ASPIRATION Left yrs ago  . CHOLECYSTECTOMY  1989  . COLONOSCOPY WITH ESOPHAGOGASTRODUODENOSCOPY (EGD)    . COLONOSCOPY WITH PROPOFOL N/A 08/03/2017   Procedure: COLONOSCOPY WITH PROPOFOL;  Surgeon: Lollie Sails, MD;  Location: [facility] ENDOSCOPY;  Service: Endoscopy;  Laterality: N/A;  . COLONOSCOPY WITH PROPOFOL N/A 10/18/2018   Procedure: COLONOSCOPY WITH PROPOFOL;  Surgeon: Lollie Sails, MD;  Location: [facility] ENDOSCOPY;  Service: Endoscopy;  Laterality: N/A;  . ESOPHAGOGASTRODUODENOSCOPY N/A 10/07/2013   Procedure: ESOPHAGOGASTRODUODENOSCOPY (EGD);  Surgeon: Danie Binder, MD;  Location: [facility] ENDO SUITE;  Service: Endoscopy;  Laterality: N/A;  patient received heparin at 530am given phenergan 74m IV 30 minutes before  . EYE SURGERY     lacrimal gland   . FLEXIBLE BRONCHOSCOPY N/A 06/04/2016   Procedure: FLEXIBLE BRONCHOSCOPY;  Surgeon: SAllyne Gee MD;  Location: [facility] ORS;  Service: Pulmonary;  Laterality: N/A;  . INCONTINENCE SURGERY  2004  . NASAL SINUS SURGERY    . THYROID LOBECTOMY    .  TOTAL THYROIDECTOMY      Medical History: Past Medical History:  Diagnosis Date  . Arthritis   . Asthma   . Asthmatic bronchitis   . BRCA negative 05/2014   MyRisk neg  . Celiac disease   . Celiac sprue   . Chronic anxiety   . Complication of anesthesia    vomiting  . COPD (chronic obstructive pulmonary disease) ([place])   . Depression   . Environmental allergies   . Family history of breast cancer 05/2014   MyRisk neg; IBIS=29.5%  . Fibromyalgia    neuropathy all over  . Gastritis 10/08/2013  . GERD (gastroesophageal reflux disease)   . Hyperlipemia   . Hyperlipidemia    Intolerant to statins  . Hypothyroidism (acquired)   . Increased risk of breast cancer 05/2014   IBIS=29.5%  . Increased risk of breast cancer 05/2014   IBIS=29.5%  . Migraine   . Migraines   . 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 Pancreatitis   . Sjogren's disease ([place])   . Spastic colon   . Thyroid  cancer ([place]) 1990 and 1994   Total thyroidectomy with radioactive iodine.     Family History: Family History  Problem Relation Age of Onset  . Arthritis/Rheumatoid Mother        died in age 90s  . Breast cancer Mother 105  . Breast cancer Maternal Grandmother        40's  . Breast cancer Maternal Aunt        X 2. 50's  . Lung cancer Maternal Aunt   . Pancreatitis Neg Hx   . Colon cancer Neg Hx     Social History   Socioeconomic History  . Marital status: Married    Spouse name: Not on file  . Number of children: 0  . Years of education: Not on file  . Highest education level: Not on file  Occupational History  . Not on file  Tobacco Use  . Smoking status: Never Smoker  . Smokeless tobacco: Never Used  Vaping Use  . Vaping Use: Never used  Substance and Sexual Activity  . Alcohol use: No  . Drug use: No  . Sexual activity: Yes    Birth control/protection: Surgical  Other Topics Concern  . Not on file  Social History Narrative  . Not on file   Social Determinants of Health   Financial Resource Strain: Low Risk   . Difficulty of Paying Living Expenses: Not very hard  Food Insecurity: Not on file  Transportation Needs: Not on file  Physical Activity: Not on file  Stress: Not on file  Social Connections: Not on file  Intimate Partner Violence: Not on file      Review of Systems  Constitutional: Positive for fatigue.  HENT: Negative.   Eyes: Negative.   Respiratory: Positive for cough, shortness of breath and wheezing (intermittent in the mornings).   Cardiovascular: Positive for leg swelling (occasional).  Gastrointestinal: Negative for abdominal pain, constipation, diarrhea, nausea and vomiting.  Genitourinary: Negative.  Negative for menstrual problem.  Musculoskeletal: Positive for arthralgias, back pain and neck pain.       History of arthritis  Skin: Negative.  Negative for rash and wound.  Neurological: Negative.   Psychiatric/Behavioral: Positive  for behavioral problems (history of depression, in remission). Negative for sleep disturbance. The patient is not nervous/anxious.     Vital Signs: BP 124/88   Pulse 77   Temp 98.6 F (37 C)   Resp 16  Ht _0  (1.651 m)   Wt 178 lb 9.6 oz (81 kg)   SpO2 97%   BMI 29.72 kg/m    Physical Exam Vitals reviewed.  Constitutional:      General: She is not in acute distress.    Appearance: Normal appearance. She is well-developed and overweight. She is not ill-appearing.  HENT:     Head: Normocephalic and atraumatic.     Right Ear: Tympanic membrane, ear canal and external ear normal.     Left Ear: Tympanic membrane, ear canal and external ear normal.     Nose: Nose normal.     Mouth/Throat:     Mouth: Mucous membranes are moist.     Pharynx: Oropharynx is clear.  Eyes:     Extraocular Movements: Extraocular movements intact.     Conjunctiva/sclera: Conjunctivae normal.     Pupils: Pupils are equal, round, and reactive to light.  Cardiovascular:     Rate and Rhythm: Normal rate and regular rhythm.     Pulses: Normal pulses.     Heart sounds: Normal heart sounds.  Pulmonary:     Effort: Pulmonary effort is normal. No respiratory distress.     Breath sounds: Normal breath sounds.  Abdominal:     General: Bowel sounds are normal. There is no distension.     Palpations: Abdomen is soft. There is no mass.     Tenderness: There is no abdominal tenderness. There is no guarding or rebound.     Hernia: No hernia is present.  Musculoskeletal:        General: Normal range of motion.     Cervical back: Normal range of motion and neck supple.  Lymphadenopathy:     Cervical: No cervical adenopathy.  Skin:    General: Skin is warm and dry.     Capillary Refill: Capillary refill takes less than 2 seconds.  Neurological:     Mental Status: She is alert and oriented to person, place, and time.  Psychiatric:        Mood and Affect: Mood normal.        Behavior: Behavior normal.  Behavior is cooperative.        Thought Content: Thought content normal.        Judgment: Judgment normal.    Assessment/Plan: 1. Encounter for general adult medical examination with abnormal findings Age-appropriate preventive screenings discussed, annual physical exam completed. Bone density scan due. Colonoscopy is not due until 2029. Mammogram is due in December 2022.   2. Acquired hypothyroidism S/P thyroidectomy. She has not had her thyroid panel drawn since December 2021. She is currently taking 137 mcg of levothyroxine.  - TSH + free T4  3. Moderate persistent asthma without complication Symptoms are manageable with current medications.   4. Allergic rhinitis, unspecified seasonality, unspecified trigger Stable on current medications.   5. Primary osteoarthritis of both hands Chronic problem, stable at this time.   6. 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  - Lipid Profile  7. Fibromyalgia Chronic problem, stable with current medications.   8. MDD (major depressive disorder), recurrent, in full remission ([place]) Stable on current medications.   9. Generalized anxiety disorder Denies anxiety at this time, controlled on current medications.  10. Screening for osteoporosis Due for routine screening via Dexa scan.  - DG Bone Density; Future  11. Vitamin D deficiency No recent vitamin D level, she is taking 50,000 units of vitamin  D weekly.  - Vitamin D (25 hydroxy)  12. Dysuria Routine urinalysis obtained, wnl.  - UA/M w/rflx Culture, Routine     General Counseling: jaymes revels understanding of the findings of todays visit and agrees with plan of treatment. I have discussed any further diagnostic evaluation that may be needed or ordered today. We also reviewed her medications today. she has been encouraged to call the office with any questions or concerns that should arise related to todays  visit.    Orders Placed This Encounter  Procedures  . Microscopic Examination  . DG Bone Density  . UA/M w/rflx Culture, Routine  . TSH + free T4  . Lipid Profile  . Vitamin D (25 hydroxy)    No orders of the defined types were placed in this encounter.   Return in about 1 month (around 04/25/2021) for F/U, Review labs/test, med refill, Dorotha Hirschi PCP.   Total time spent:30 Minutes Time spent includes review of chart, medications, test results, and follow up plan with the patient.   [place] Controlled Substance Database was reviewed by me.  This patient was seen by [name], FNP-C in collaboration with Dr. Clayborn Bigness as a part of collaborative care agreement.  Lindell Tussey R. Valetta Fuller, MSN, FNP-C Internal medicine

## 2021-03-27 DIAGNOSIS — N951 Menopausal and female climacteric states: Secondary | ICD-10-CM | POA: Diagnosis not present

## 2021-03-27 DIAGNOSIS — G4733 Obstructive sleep apnea (adult) (pediatric): Secondary | ICD-10-CM | POA: Diagnosis not present

## 2021-03-27 DIAGNOSIS — E890 Postprocedural hypothyroidism: Secondary | ICD-10-CM | POA: Diagnosis not present

## 2021-03-27 DIAGNOSIS — G629 Polyneuropathy, unspecified: Secondary | ICD-10-CM | POA: Diagnosis not present

## 2021-03-27 DIAGNOSIS — E785 Hyperlipidemia, unspecified: Secondary | ICD-10-CM | POA: Diagnosis not present

## 2021-03-27 DIAGNOSIS — M62838 Other muscle spasm: Secondary | ICD-10-CM | POA: Diagnosis not present

## 2021-03-27 DIAGNOSIS — R69 Illness, unspecified: Secondary | ICD-10-CM | POA: Diagnosis not present

## 2021-03-27 DIAGNOSIS — K219 Gastro-esophageal reflux disease without esophagitis: Secondary | ICD-10-CM | POA: Diagnosis not present

## 2021-03-27 DIAGNOSIS — E669 Obesity, unspecified: Secondary | ICD-10-CM | POA: Diagnosis not present

## 2021-03-27 DIAGNOSIS — F411 Generalized anxiety disorder: Secondary | ICD-10-CM | POA: Diagnosis not present

## 2021-03-27 LAB — UA/M W/RFLX CULTURE, ROUTINE
Bilirubin, UA: NEGATIVE
Glucose, UA: NEGATIVE
Ketones, UA: NEGATIVE
Leukocytes,UA: NEGATIVE
Nitrite, UA: NEGATIVE
Protein,UA: NEGATIVE
RBC, UA: NEGATIVE
Specific Gravity, UA: 1.019 (ref 1.005–1.030)
Urobilinogen, Ur: 0.2 mg/dL (ref 0.2–1.0)
pH, UA: 7 (ref 5.0–7.5)

## 2021-03-27 LAB — MICROSCOPIC EXAMINATION
Bacteria, UA: NONE SEEN
Casts: NONE SEEN /lpf
Epithelial Cells (non renal): NONE SEEN /hpf (ref 0–10)
WBC, UA: NONE SEEN /hpf (ref 0–5)

## 2021-03-30 ENCOUNTER — Other Ambulatory Visit: Payer: Self-pay | Admitting: Nurse Practitioner

## 2021-03-30 DIAGNOSIS — E039 Hypothyroidism, unspecified: Secondary | ICD-10-CM

## 2021-04-15 ENCOUNTER — Other Ambulatory Visit: Payer: Self-pay

## 2021-04-15 DIAGNOSIS — E559 Vitamin D deficiency, unspecified: Secondary | ICD-10-CM

## 2021-04-15 DIAGNOSIS — R300 Dysuria: Secondary | ICD-10-CM

## 2021-04-15 DIAGNOSIS — Z0001 Encounter for general adult medical examination with abnormal findings: Secondary | ICD-10-CM

## 2021-04-15 DIAGNOSIS — M19041 Primary osteoarthritis, right hand: Secondary | ICD-10-CM

## 2021-04-15 DIAGNOSIS — Z13820 Encounter for screening for osteoporosis: Secondary | ICD-10-CM

## 2021-04-15 DIAGNOSIS — M797 Fibromyalgia: Secondary | ICD-10-CM

## 2021-04-15 DIAGNOSIS — J309 Allergic rhinitis, unspecified: Secondary | ICD-10-CM

## 2021-04-15 DIAGNOSIS — J4540 Moderate persistent asthma, uncomplicated: Secondary | ICD-10-CM

## 2021-04-15 DIAGNOSIS — E7841 Elevated Lipoprotein(a): Secondary | ICD-10-CM

## 2021-04-15 DIAGNOSIS — E039 Hypothyroidism, unspecified: Secondary | ICD-10-CM

## 2021-04-15 DIAGNOSIS — F411 Generalized anxiety disorder: Secondary | ICD-10-CM

## 2021-04-15 DIAGNOSIS — F3342 Major depressive disorder, recurrent, in full remission: Secondary | ICD-10-CM

## 2021-04-16 DIAGNOSIS — E039 Hypothyroidism, unspecified: Secondary | ICD-10-CM | POA: Diagnosis not present

## 2021-04-16 DIAGNOSIS — E559 Vitamin D deficiency, unspecified: Secondary | ICD-10-CM | POA: Diagnosis not present

## 2021-04-16 DIAGNOSIS — E7841 Elevated Lipoprotein(a): Secondary | ICD-10-CM | POA: Diagnosis not present

## 2021-04-17 LAB — VITAMIN D 25 HYDROXY (VIT D DEFICIENCY, FRACTURES): Vit D, 25-Hydroxy: 28.1 ng/mL — ABNORMAL LOW (ref 30.0–100.0)

## 2021-04-17 LAB — LIPID PANEL
Chol/HDL Ratio: 3.7 ratio (ref 0.0–4.4)
Cholesterol, Total: 188 mg/dL (ref 100–199)
HDL: 51 mg/dL (ref >39)
LDL Chol Calc (NIH): 113 mg/dL — ABNORMAL HIGH (ref 0–99)
Triglycerides: 135 mg/dL (ref 0–149)
VLDL Cholesterol Cal: 24 mg/dL (ref 5–40)

## 2021-04-17 LAB — TSH+FREE T4
Free T4: 1.28 ng/dL (ref 0.82–1.77)
TSH: 0.056 u[IU]/mL — ABNORMAL LOW (ref 0.450–4.500)

## 2021-04-19 ENCOUNTER — Telehealth: Payer: Self-pay

## 2021-04-19 NOTE — Telephone Encounter (Signed)
Left vm to screen for 04/22/21 appointment-Toni

## 2021-04-22 ENCOUNTER — Ambulatory Visit (INDEPENDENT_AMBULATORY_CARE_PROVIDER_SITE_OTHER): Payer: Medicare HMO | Admitting: Nurse Practitioner

## 2021-04-22 ENCOUNTER — Ambulatory Visit (HOSPITAL_COMMUNITY)
Admission: RE | Admit: 2021-04-22 | Discharge: 2021-04-22 | Disposition: A | Discharge status: Home / Self Care | Payer: Medicare HMO | Source: Ambulatory Visit | Attending: Nurse Practitioner | Admitting: Nurse Practitioner

## 2021-04-22 ENCOUNTER — Encounter: Payer: Self-pay | Admitting: Nurse Practitioner

## 2021-04-22 ENCOUNTER — Other Ambulatory Visit: Payer: Self-pay

## 2021-04-22 VITALS — BP 115/74 | HR 85 | Temp 97.7°F | Resp 16 | Ht 65.0 in | Wt 188.6 lb

## 2021-04-22 DIAGNOSIS — J4540 Moderate persistent asthma, uncomplicated: Secondary | ICD-10-CM | POA: Insufficient documentation

## 2021-04-22 DIAGNOSIS — R5383 Other fatigue: Secondary | ICD-10-CM | POA: Diagnosis not present

## 2021-04-22 DIAGNOSIS — K900 Celiac disease: Secondary | ICD-10-CM | POA: Diagnosis not present

## 2021-04-22 DIAGNOSIS — E559 Vitamin D deficiency, unspecified: Secondary | ICD-10-CM | POA: Insufficient documentation

## 2021-04-22 DIAGNOSIS — J449 Chronic obstructive pulmonary disease, unspecified: Secondary | ICD-10-CM | POA: Diagnosis not present

## 2021-04-22 DIAGNOSIS — M797 Fibromyalgia: Secondary | ICD-10-CM | POA: Insufficient documentation

## 2021-04-22 DIAGNOSIS — F411 Generalized anxiety disorder: Secondary | ICD-10-CM | POA: Insufficient documentation

## 2021-04-22 DIAGNOSIS — M19042 Primary osteoarthritis, left hand: Secondary | ICD-10-CM | POA: Insufficient documentation

## 2021-04-22 DIAGNOSIS — M19041 Primary osteoarthritis, right hand: Secondary | ICD-10-CM | POA: Diagnosis not present

## 2021-04-22 DIAGNOSIS — F3342 Major depressive disorder, recurrent, in full remission: Secondary | ICD-10-CM | POA: Insufficient documentation

## 2021-04-22 DIAGNOSIS — Z0001 Encounter for general adult medical examination with abnormal findings: Secondary | ICD-10-CM | POA: Diagnosis not present

## 2021-04-22 DIAGNOSIS — R300 Dysuria: Secondary | ICD-10-CM | POA: Insufficient documentation

## 2021-04-22 DIAGNOSIS — Z780 Asymptomatic menopausal state: Secondary | ICD-10-CM | POA: Insufficient documentation

## 2021-04-22 DIAGNOSIS — Z13820 Encounter for screening for osteoporosis: Secondary | ICD-10-CM | POA: Diagnosis not present

## 2021-04-22 DIAGNOSIS — R69 Illness, unspecified: Secondary | ICD-10-CM | POA: Diagnosis not present

## 2021-04-22 DIAGNOSIS — E039 Hypothyroidism, unspecified: Secondary | ICD-10-CM | POA: Diagnosis not present

## 2021-04-22 DIAGNOSIS — J309 Allergic rhinitis, unspecified: Secondary | ICD-10-CM | POA: Insufficient documentation

## 2021-04-22 DIAGNOSIS — E7841 Elevated Lipoprotein(a): Secondary | ICD-10-CM | POA: Insufficient documentation

## 2021-04-22 MED ORDER — VITAMIN D (ERGOCALCIFEROL) 1.25 MG (50000 UNIT) PO CAPS: 50000.0000 [IU] | ORAL_CAPSULE | ORAL | 1 refills | Status: DC

## 2021-04-22 MED ORDER — LEVOTHYROXINE SODIUM 137 MCG PO TABS: 137.0000 ug | ORAL_TABLET | Freq: Every day | ORAL | 5 refills | Status: DC

## 2021-04-22 NOTE — Progress Notes (Signed)
[facility] [place], [place] [address]  Internal MEDICINE  Office Visit Note  Patient Name: Susan [name]  [id number]  [id number]  Date of Service: 04/27/2021  Chief Complaint  Patient presents with   Follow-up    Gets SOB easily    Asthma   COPD   Quality Metric Gaps    Covid booster    HPI Susan [name] presents for a follow up visit to discuss SOB and fatigue. She has a history of asthma and COPD. She also has a history of anxiety, arthritis, cancer, depression GERD, hyperlipidemia, hypothyroidism, and celiac disease. Her bone density scan was normal. Her labs showed low vitamin D.  For her COPD, she is currently using breo ellipta, if her breathing worsens, she will consider switching to trelegy.    Current Medication: Outpatient Encounter Medications as of 04/22/2021  Medication Sig   acetaminophen (TYLENOL) 500 MG tablet Take by mouth as needed.   albuterol (PROVENTIL HFA;VENTOLIN HFA) 108 (90 Base) MCG/ACT inhaler Inhale 2 puffs into the lungs every 6 (six) hours as needed for wheezing or shortness of breath.   albuterol (PROVENTIL) (2.5 MG/3ML) 0.083% nebulizer solution Take 3 mLs (2.5 mg total) by nebulization every 6 (six) hours as needed for wheezing or shortness of breath.   ALPRAZolam (XANAX) 0.5 MG tablet Take 0.5 mg by mouth 3 (three) times daily as needed.   amitriptyline (ELAVIL) 25 MG tablet Take 1 tablet (25 mg total) by mouth at bedtime.   Ascorbic Acid (VITAMIN C) 100 MG tablet Take 100 mg by mouth daily.    BREO ELLIPTA 100-25 MCG/INH AEPB 1 PUFF BY MOUTH ONCE DAILY   calcium carbonate (OS-CAL) 1250 (500 Ca) MG chewable tablet Chew by mouth.   carisoprodol (SOMA) 350 MG tablet Take 350 mg by mouth at bedtime. Patient states that she takes 1/2 tablet nightly    Dentifrices (BIOTENE DRY MOUTH DT) by Transmucosal route.   eletriptan (RELPAX) 40 MG tablet Take 40 mg by mouth as needed for migraine. may repeat in 2 hours if necessary     EPINEPHRINE 0.3 mg/0.3 mL IJ SOAJ injection INJECT CONTENTS OF 1 PEN AS NEEDED FOR ALLERGIC REACTION   estradiol (CLIMARA) 0.1 mg/24hr patch APPLY 1 PATCH TO SKIN ONCE WEEKLY   ezetimibe (ZETIA) 10 MG tablet TAKE 1 TABLET BY MOUTH EVERY DAY   famotidine (PEPCID) 10 MG tablet Take 20 mg by mouth.    fenofibrate (TRICOR) 145 MG tablet TAKE 1 TABLET BY MOUTH EVERY DAY   fluticasone (FLONASE) 50 MCG/ACT nasal spray Place 2 sprays into both nostrils daily.   gabapentin (NEURONTIN) 600 MG tablet Take 1/2 (one-half) tablet by mouth twice daily   HYDROcodone-acetaminophen (NORCO) 7.5-325 MG tablet Take 1-2 tablets by mouth every 6 (six) hours as needed for moderate pain.   hydroxychloroquine (PLAQUENIL) 200 MG tablet Take 1 tablet (200 mg total) by mouth 2 (two) times daily.   hyoscyamine (ANASPAZ) 0.125 MG TBDP disintergrating tablet Place 0.125 mg under the tongue every 4 (four) hours as needed for bladder spasms.    Lactobacillus (DIGESTIVE HEALTH PROBIOTIC PO) Take 1 tablet by mouth daily.    LINZESS 290 MCG CAPS capsule Take 290 mcg by mouth daily.    Magnesium Oxide (NATRUL MAGNESIUM PO) Take by mouth.    montelukast (SINGULAIR) 10 MG tablet TAKE ONE TABLET BY MOUTH ONCE DAILY FOR ASTHMA   Multiple Vitamins-Minerals (ZINC PO) Take by mouth daily.   mupirocin ointment (BACTROBAN) 2 % as needed.  pantoprazole (PROTONIX) 40 MG tablet Take 40 mg by mouth daily.    Polyethyl Glycol-Propyl Glycol 0.4-0.3 % SOLN Apply to eye.    PRESCRIPTION MEDICATION 1 Syringe by Subdermal route once a week. Allergy shot    Probiotic Product ([place]) CAPS Take by mouth.    sodium chloride (OCEAN) 0.65 % SOLN nasal spray Place 1 spray into both nostrils as needed for congestion.    sodium chloride 0.9 % nebulizer solution    SUMAtriptan-naproxen (TREXIMET) 85-500 MG tablet daily as needed.    Tuberculin-Allergy Syringes 28G X 1/2" 1 ML MISC 1 each by Does not apply route every 14 (fourteen) days. For  allergy injections   Vaginal Lubricant (REPLENS) GEL Place vaginally.    [START ON 05/21/2021] venlafaxine XR (EFFEXOR-XR) 150 MG 24 hr capsule Take 2 capsules (300 mg total) by mouth at bedtime.   vitamin E 1000 UNIT capsule Take by mouth.    White Petrolatum-Mineral Oil ([place] PETROL-MINERAL OIL-LANOLIN) 0.1-0.1 % OINT    [DISCONTINUED] levothyroxine (SYNTHROID) 137 MCG tablet Take 1 tablet (137 mcg total) by mouth daily before breakfast.   [DISCONTINUED] Vitamin D, Ergocalciferol, (DRISDOL) 1.25 MG (50000 UNIT) CAPS capsule TAKE 1 CAPSULE (50,000 UNITS TOTAL) BY MOUTH EVERY 7 (SEVEN) DAYS. MONDAY   levothyroxine (SYNTHROID) 137 MCG tablet Take 1 tablet (137 mcg total) by mouth daily before breakfast.   Vitamin D, Ergocalciferol, (DRISDOL) 1.25 MG (50000 UNIT) CAPS capsule Take 1 capsule (50,000 Units total) by mouth every 7 (seven) days. Monday   No facility-administered encounter medications on file as of 04/22/2021.    Surgical History: Past Surgical History:  Procedure Laterality Date   ABDOMINAL HYSTERECTOMY  2006   BLADDER REPAIR     BREAST CYST ASPIRATION Left yrs ago   [place] ESOPHAGOGASTRODUODENOSCOPY (EGD)     COLONOSCOPY WITH PROPOFOL N/A 08/03/2017   Procedure: COLONOSCOPY WITH PROPOFOL;  Surgeon: Lollie Sails, MD;  Location: [facility] ENDOSCOPY;  Service: Endoscopy;  Laterality: N/A;   COLONOSCOPY WITH PROPOFOL N/A 10/18/2018   Procedure: COLONOSCOPY WITH PROPOFOL;  Surgeon: Lollie Sails, MD;  Location: [facility] ENDOSCOPY;  Service: Endoscopy;  Laterality: N/A;   ESOPHAGOGASTRODUODENOSCOPY N/A 10/07/2013   Procedure: ESOPHAGOGASTRODUODENOSCOPY (EGD);  Surgeon: Danie Binder, MD;  Location: [facility] ENDO SUITE;  Service: Endoscopy;  Laterality: N/A;  patient received heparin at 530am given phenergan 55m IV 30 minutes before   EYE SURGERY     lacrimal gland    FLEXIBLE BRONCHOSCOPY N/A 06/04/2016   Procedure: FLEXIBLE BRONCHOSCOPY;  Surgeon: SAllyne Gee MD;  Location: [facility] ORS;  Service: Pulmonary;  Laterality: N/A;   INCONTINENCE SURGERY  2004   NASAL SINUS SURGERY     THYROID LOBECTOMY     TOTAL THYROIDECTOMY      Medical History: Past Medical History:  Diagnosis Date   Arthritis    Asthma    Asthmatic bronchitis    BRCA negative 05/2014   MyRisk neg   Celiac disease    Celiac sprue    Chronic anxiety    Complication of anesthesia    vomiting   [place]    Depression    Environmental allergies    Family history of breast cancer 05/2014   MyRisk neg; IBIS=29.5%   Fibromyalgia    neuropathy all over   Gastritis 10/08/2013   GERD (gastroesophageal reflux disease)    Hyperlipemia    Hyperlipidemia    Intolerant to statins  Hypothyroidism (acquired)    Increased risk of breast cancer 05/2014   IBIS=29.5%   Increased risk of breast cancer 05/2014   IBIS=29.5%   Migraine    Migraines    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     Family History: Family History  Problem Relation Age of Onset   Arthritis/Rheumatoid Mother        died in age 48s   Breast cancer Mother 63   Breast cancer Maternal Grandmother        93's   Breast cancer Maternal Aunt        X 2. 50's   Lung cancer Maternal Aunt    Pancreatitis Neg Hx    Colon cancer Neg Hx     Social History   Socioeconomic History   Marital status: Married    Spouse name: Not on file   Number of children: 0   Years of education: Not on file   Highest education level: Not on file  Occupational History   Not on file  Tobacco Use   Smoking status: Never   Smokeless tobacco: Never  Vaping Use   Vaping Use: Never used  Substance and Sexual Activity   Alcohol use: No   Drug use: No   Sexual activity: Yes    Birth control/protection: Surgical  Other Topics Concern    Not on file  Social History Narrative   Not on file   Social Determinants of Health   Financial Resource Strain: Low Risk    Difficulty of Paying Living Expenses: Not very hard  Food Insecurity: Not on file  Transportation Needs: Not on file  Physical Activity: Not on file  Stress: Not on file  Social Connections: Not on file  Intimate Partner Violence: Not on file      Review of Systems  Constitutional:  Positive for fatigue. Negative for chills and unexpected weight change.  HENT:  Negative for congestion, rhinorrhea, sneezing and sore throat.   Eyes:  Negative for redness.  Respiratory:  Positive for shortness of breath. Negative for cough, chest tightness and wheezing.   Cardiovascular:  Negative for chest pain and palpitations.  Gastrointestinal:  Negative for abdominal pain, constipation, diarrhea, nausea and vomiting.  Genitourinary:  Negative for dysuria and frequency.  Musculoskeletal:  Negative for arthralgias, back pain, joint swelling and neck pain.  Skin:  Negative for rash.  Neurological: Negative.  Negative for tremors and numbness.  Hematological:  Negative for adenopathy. Does not bruise/bleed easily.  Psychiatric/Behavioral:  Negative for behavioral problems (Depression), sleep disturbance and suicidal ideas. The patient is not nervous/anxious.    Vital Signs: BP 115/74   Pulse 85   Temp 97.7 F (36.5 C)   Resp 16   Ht 5' 5"  (1.651 m)   Wt 188 lb 9.6 oz (85.5 kg)   SpO2 97%   BMI 31.38 kg/m    Physical Exam Vitals reviewed.  Constitutional:      General: She is not in acute distress.    Appearance: Normal appearance. She is obese. She is not ill-appearing.  Cardiovascular:     Rate and Rhythm: Normal rate and regular rhythm.     Pulses: Normal pulses.     Heart sounds: Normal heart sounds. No murmur heard. Pulmonary:     Effort: Pulmonary effort is normal. No respiratory distress.  Breath sounds: Normal breath sounds.  Skin:    General:  Skin is warm and dry.     Capillary Refill: Capillary refill takes less than 2 seconds.  Neurological:     Mental Status: She is alert and oriented to person, place, and time.  Psychiatric:        Mood and Affect: Mood normal.        Behavior: Behavior normal.    Assessment/Plan: 1. Other fatigue Will check B12 and folate level due to symptoms of fatigue.  - B12 and Folate Panel  2. Vitamin D deficiency Vitamin D weekly supplement prescribed due to low serum vitamin D.  - Vitamin D, Ergocalciferol, (DRISDOL) 1.25 MG (50000 UNIT) CAPS capsule; Take 1 capsule (50,000 Units total) by mouth every 7 (seven) days. Monday  Dispense: 12 capsule; Refill: 1  3. Acquired hypothyroidism Levothyroxine refill ordered.  - levothyroxine (SYNTHROID) 137 MCG tablet; Take 1 tablet (137 mcg total) by mouth daily before breakfast.  Dispense: 30 tablet; Refill: 5  4. Chronic obstructive pulmonary disease, unspecified COPD type ([place]) Patient is SOB intermittently. Currently using breo ellipta, if breathing does not improve or worsens by her next office visit, consider switching to trelegy ellipta.    General Counseling: calle schader understanding of the findings of todays visit and agrees with plan of treatment. I have discussed any further diagnostic evaluation that may be needed or ordered today. We also reviewed her medications today. she has been encouraged to call the office with any questions or concerns that should arise related to todays visit.    Orders Placed This Encounter  Procedures   B12 and Folate Panel    Meds ordered this encounter  Medications   Vitamin D, Ergocalciferol, (DRISDOL) 1.25 MG (50000 UNIT) CAPS capsule    Sig: Take 1 capsule (50,000 Units total) by mouth every 7 (seven) days. Monday    Dispense:  12 capsule    Refill:  1   levothyroxine (SYNTHROID) 137 MCG tablet    Sig: Take 1 tablet (137 mcg total) by mouth daily before breakfast.    Dispense:  30 tablet     Refill:  5    Return in about 3 months (around 07/23/2021) for F/U, med refill, Estelle Skibicki PCP.   Total time spent:30 Minutes Time spent includes review of chart, medications, test results, and follow up plan with the patient.   [place] Controlled Substance Database was reviewed by me.  This patient was seen by [name], FNP-C in collaboration with Dr. Clayborn Bigness as a part of collaborative care agreement.   Verneta Hamidi R. Valetta Fuller, MSN, FNP-C Internal medicine

## 2021-04-23 DIAGNOSIS — D510 Vitamin B12 deficiency anemia due to intrinsic factor deficiency: Secondary | ICD-10-CM | POA: Diagnosis not present

## 2021-04-23 DIAGNOSIS — R5383 Other fatigue: Secondary | ICD-10-CM | POA: Diagnosis not present

## 2021-04-23 DIAGNOSIS — G4733 Obstructive sleep apnea (adult) (pediatric): Secondary | ICD-10-CM | POA: Diagnosis not present

## 2021-04-24 LAB — B12 AND FOLATE PANEL
Folate: 2.4 ng/mL — ABNORMAL LOW (ref >3.0)
Vitamin B-12: 264 pg/mL (ref 232–1245)

## 2021-04-27 ENCOUNTER — Other Ambulatory Visit: Payer: Self-pay | Admitting: Nurse Practitioner

## 2021-04-27 DIAGNOSIS — E538 Deficiency of other specified B group vitamins: Secondary | ICD-10-CM

## 2021-04-27 MED ORDER — FOLIC ACID 1 MG PO TABS: 1.0000 mg | ORAL_TABLET | Freq: Every day | ORAL | 1 refills | Status: DC

## 2021-04-30 ENCOUNTER — Telehealth: Payer: Self-pay

## 2021-04-30 NOTE — Telephone Encounter (Signed)
Called patient to give her the results of her b12 level and to inform her that a prescription was sent to the pharmacy, but there was no answer when I called.LNB

## 2021-05-09 ENCOUNTER — Telehealth: Payer: Self-pay | Admitting: Pharmacist

## 2021-05-09 NOTE — Progress Notes (Addendum)
Chronic Care Management Pharmacy Assistant   Name: Susan [name]  MRN: [id number] DOB: [birth year]  Reason for Encounter: General Disease State Call   Conditions to be addressed/monitored: Chronic Migraines, Asthma, COPD, Allergic Rhinitis, GERD, Hypothyroidism, Depression/Anxiety, Osteoporosis, Sjogren's Syndrome, HLD  Recent office visits:  04/22/21 [name], NP. For follow-up. No medication changes.  Recent consult visits:  03/19/21 [facility] [place], Trey Sailors.  No information given.   Hospital visits: None since 03/06/21  Medications: Outpatient Encounter Medications as of 05/09/2021  Medication Sig   acetaminophen (TYLENOL) 500 MG tablet Take by mouth as needed.   albuterol (PROVENTIL HFA;VENTOLIN HFA) 108 (90 Base) MCG/ACT inhaler Inhale 2 puffs into the lungs every 6 (six) hours as needed for wheezing or shortness of breath.   albuterol (PROVENTIL) (2.5 MG/3ML) 0.083% nebulizer solution Take 3 mLs (2.5 mg total) by nebulization every 6 (six) hours as needed for wheezing or shortness of breath.   ALPRAZolam (XANAX) 0.5 MG tablet Take 0.5 mg by mouth 3 (three) times daily as needed.   amitriptyline (ELAVIL) 25 MG tablet Take 1 tablet (25 mg total) by mouth at bedtime.   Ascorbic Acid (VITAMIN C) 100 MG tablet Take 100 mg by mouth daily.    BREO ELLIPTA 100-25 MCG/INH AEPB 1 PUFF BY MOUTH ONCE DAILY   calcium carbonate (OS-CAL) 1250 (500 Ca) MG chewable tablet Chew by mouth.   carisoprodol (SOMA) 350 MG tablet Take 350 mg by mouth at bedtime. Patient states that she takes 1/2 tablet nightly    Dentifrices (BIOTENE DRY MOUTH DT) by Transmucosal route.   eletriptan (RELPAX) 40 MG tablet Take 40 mg by mouth as needed for migraine. may repeat in 2 hours if necessary    EPINEPHRINE 0.3 mg/0.3 mL IJ SOAJ injection INJECT CONTENTS OF 1 PEN AS NEEDED FOR ALLERGIC REACTION   estradiol (CLIMARA) 0.1 mg/24hr patch APPLY 1 PATCH TO SKIN ONCE WEEKLY   ezetimibe (ZETIA) 10 MG tablet  TAKE 1 TABLET BY MOUTH EVERY DAY   famotidine (PEPCID) 10 MG tablet Take 20 mg by mouth.    fenofibrate (TRICOR) 145 MG tablet TAKE 1 TABLET BY MOUTH EVERY DAY   fluticasone (FLONASE) 50 MCG/ACT nasal spray Place 2 sprays into both nostrils daily.   folic acid (FOLVITE) 1 MG tablet Take 1 tablet (1 mg total) by mouth daily.   gabapentin (NEURONTIN) 600 MG tablet Take 1/2 (one-half) tablet by mouth twice daily   HYDROcodone-acetaminophen (NORCO) 7.5-325 MG tablet Take 1-2 tablets by mouth every 6 (six) hours as needed for moderate pain.   hydroxychloroquine (PLAQUENIL) 200 MG tablet Take 1 tablet (200 mg total) by mouth 2 (two) times daily.   hyoscyamine (ANASPAZ) 0.125 MG TBDP disintergrating tablet Place 0.125 mg under the tongue every 4 (four) hours as needed for bladder spasms.    Lactobacillus (DIGESTIVE HEALTH PROBIOTIC PO) Take 1 tablet by mouth daily.    levothyroxine (SYNTHROID) 137 MCG tablet Take 1 tablet (137 mcg total) by mouth daily before breakfast.   LINZESS 290 MCG CAPS capsule Take 290 mcg by mouth daily.    Magnesium Oxide (NATRUL MAGNESIUM PO) Take by mouth.    montelukast (SINGULAIR) 10 MG tablet TAKE ONE TABLET BY MOUTH ONCE DAILY FOR ASTHMA   Multiple Vitamins-Minerals (ZINC PO) Take by mouth daily.   mupirocin ointment (BACTROBAN) 2 % as needed.    pantoprazole (PROTONIX) 40 MG tablet Take 40 mg by mouth daily.    Polyethyl Glycol-Propyl Glycol 0.4-0.3 % SOLN Apply to eye.  PRESCRIPTION MEDICATION 1 Syringe by Subdermal route once a week. Allergy shot    Probiotic Product ([place]) CAPS Take by mouth.    sodium chloride (OCEAN) 0.65 % SOLN nasal spray Place 1 spray into both nostrils as needed for congestion.    sodium chloride 0.9 % nebulizer solution    SUMAtriptan-naproxen (TREXIMET) 85-500 MG tablet daily as needed.    Tuberculin-Allergy Syringes 28G X 1/2" 1 ML MISC 1 each by Does not apply route every 14 (fourteen) days. For allergy injections    Vaginal Lubricant (REPLENS) GEL Place vaginally.    [START ON 05/21/2021] venlafaxine XR (EFFEXOR-XR) 150 MG 24 hr capsule Take 2 capsules (300 mg total) by mouth at bedtime.   Vitamin D, Ergocalciferol, (DRISDOL) 1.25 MG (50000 UNIT) CAPS capsule Take 1 capsule (50,000 Units total) by mouth every 7 (seven) days. Monday   vitamin E 1000 UNIT capsule Take by mouth.    White Petrolatum-Mineral Oil ([place] PETROL-MINERAL OIL-LANOLIN) 0.1-0.1 % OINT    No facility-administered encounter medications on file as of 05/09/2021.   GEN Call: Patient stated she has been pretty busy lately so she has not got around to fill out the forms for the PAP applications. She stated she did start a new medication, which is folic acid. She stated she was tolerating the new medication very well and did not have any questions. She stated since she was home she would get started on filling out her PAP application. I explained to her if she got confused on anything she could give me a call anytime.   Star Rating Drugs: Gabapentin 30 DS 04/08/21  Follow-Up:Pharmacist Revie  Charlann Lange, RMA Clinical Pharmacist Assistant [phone]   10 minutes spent in review, coordination, and documentation.  Reviewed by: Beverly Milch, PharmD Clinical Pharmacist [facility] [phone]

## 2021-05-15 ENCOUNTER — Other Ambulatory Visit: Payer: Self-pay

## 2021-05-15 ENCOUNTER — Telehealth: Payer: Self-pay

## 2021-05-15 ENCOUNTER — Telehealth: Payer: Self-pay | Admitting: Rheumatology

## 2021-05-15 DIAGNOSIS — J3089 Other allergic rhinitis: Secondary | ICD-10-CM

## 2021-05-15 DIAGNOSIS — E785 Hyperlipidemia, unspecified: Secondary | ICD-10-CM

## 2021-05-15 MED ORDER — FENOFIBRATE 145 MG PO TABS: 145.0000 mg | ORAL_TABLET | Freq: Every day | ORAL | 1 refills | Status: DC

## 2021-05-15 MED ORDER — EZETIMIBE 10 MG PO TABS: 10.0000 mg | ORAL_TABLET | Freq: Every day | ORAL | 1 refills | Status: DC

## 2021-05-15 MED ORDER — MONTELUKAST SODIUM 10 MG PO TABS: ORAL_TABLET | ORAL | 3 refills | Status: DC

## 2021-05-15 NOTE — Telephone Encounter (Signed)
Patient request you change her pharmacy from [facility] in [place] to [facility] in [place].

## 2021-05-15 NOTE — Telephone Encounter (Signed)
Pharmacy changed in patient's chart.

## 2021-05-15 NOTE — Telephone Encounter (Signed)
Pt calling to let us know she is changing pharmcies from [facility] in [place] to [facility] in [place].  [phone]  Pt aware pharm is already changed in her chart.  Pt states she has had estradiol tx'd.

## 2021-05-23 DIAGNOSIS — G4733 Obstructive sleep apnea (adult) (pediatric): Secondary | ICD-10-CM | POA: Diagnosis not present

## 2021-05-26 DIAGNOSIS — J449 Chronic obstructive pulmonary disease, unspecified: Secondary | ICD-10-CM | POA: Diagnosis not present

## 2021-05-26 DIAGNOSIS — J9600 Acute respiratory failure, unspecified whether with hypoxia or hypercapnia: Secondary | ICD-10-CM | POA: Diagnosis not present

## 2021-05-26 DIAGNOSIS — K219 Gastro-esophageal reflux disease without esophagitis: Secondary | ICD-10-CM | POA: Diagnosis not present

## 2021-06-23 DIAGNOSIS — G4733 Obstructive sleep apnea (adult) (pediatric): Secondary | ICD-10-CM | POA: Diagnosis not present
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
## 2021-06-28 DIAGNOSIS — G4733 Obstructive sleep apnea (adult) (pediatric): Secondary | ICD-10-CM | POA: Diagnosis not present

## 2021-06-28 NOTE — Progress Notes (Signed)
Office Visit Note  Patient: Susan [name]             Date of Birth: 09-17-1956           MRN: [id number]             PCP: Lavera Guise, MD Referring: Ronnell Freshwater, NP Visit Date: 07/12/2021 Occupation: @GUAROCC @  Subjective:  Right shoulder pain.   History of Present Illness: Susan [name] is a 65 y.o. female with history of Sjogren's, osteoarthritis and fibromyalgia syndrome.  She states she came off Plaquenil for 4 5 months and then restarted in May 2022 due to sicca symptoms and increased joint pain.  She has been having increased pain in her right shoulder joint the last 4 months.  She is having difficulty raising her arm.  She describes pain when her right lower extremity.  She continues to have dry mouth and dry eye symptoms.  She has been taking Plaquenil on a regular basis.  She has generalized pain and discomfort from fibromyalgia.  Activities of Daily Living:  Patient reports morning stiffness for 15-20 minutes.   Patient Reports nocturnal pain.  Difficulty dressing/grooming: Reports Difficulty climbing stairs: Denies Difficulty getting out of chair: Denies Difficulty using hands for taps, buttons, cutlery, and/or writing: Reports  Review of Systems  Constitutional:  Positive for fatigue.  HENT:  Positive for mouth sores, mouth dryness and nose dryness.   Eyes:  Positive for dryness. Negative for pain and itching.  Respiratory:  Negative for shortness of breath and difficulty breathing.   Cardiovascular:  Negative for chest pain and palpitations.  Gastrointestinal:  Negative for blood in stool, constipation and diarrhea.  Endocrine: Negative for increased urination.  Genitourinary:  Negative for difficulty urinating.  Musculoskeletal:  Positive for joint pain, joint pain, joint swelling, myalgias, morning stiffness, muscle tenderness and myalgias.  Skin:  Negative for color change, rash, redness and sensitivity to sunlight.  Allergic/Immunologic: Negative for  susceptible to infections.  Neurological:  Positive for headaches and memory loss. Negative for dizziness, numbness and weakness.  Hematological:  Negative for bruising/bleeding tendency.  Psychiatric/Behavioral:  Positive for depressed mood and sleep disturbance. Negative for confusion. The patient is nervous/anxious.    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   Essential tremor 06/20/2020   OSA (obstructive sleep apnea) 03/26/2020   Chronic wrist pain, right 11/02/2019   Chronic pain of left wrist 11/02/2019   Pain in joints of right hand 11/02/2019   Pain in joint of left hand 11/02/2019   Primary osteoarthritis of both hands 11/02/2019   MDD (major depressive disorder), recurrent, in partial remission ([place]) 10/05/2019   Dysuria 04/03/2019   Generalized anxiety disorder 02/28/2019   Chronic pain of both knees 03/01/2018   Encounter for general adult medical examination with abnormal findings 03/01/2018   Primary osteoarthritis of both first carpometacarpal joints 03/01/2018   Chronic obstructive pulmonary disease, unspecified ([place]) 12/04/2017   Shortness of breath 12/04/2017   Allergic rhinitis, unspecified 12/04/2017   Low grade squamous intraepith lesion on cytologic smear vagina (lgsil) 09/09/2017   Vaginal atrophy 09/01/2017   Chronic right-sided low back pain without sciatica 01/05/2017   Primary Sjogren's syndrome ([place]) 01/05/2017  CD (celiac disease) 04/17/2015   Gastroesophageal reflux disease with esophagitis 04/17/2015   Hx of Sjogren's disease ([place]) 04/17/2015   Acute asthma exacerbation 10/14/2013   Acute respiratory failure ([place]) 10/14/2013    Gastritis 10/08/2013   Bradycardia 10/07/2013   Esophageal spasm 10/06/2013   Substernal precordial chest pain 10/06/2013   Chronic anxiety 10/06/2013   GERD (gastroesophageal reflux disease) 10/06/2013   Chronic asthma 10/06/2013   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   Depression with anxiety 10/06/2013   Fibromyalgia 10/06/2013   Hypothyroidism 10/06/2013   Chronic migraine 10/06/2013    Past Medical History:  Diagnosis Date   Arthritis    Asthma    Asthmatic bronchitis    BRCA negative 05/2014   MyRisk neg   Celiac disease    Celiac sprue    Chronic anxiety    Complication of anesthesia    vomiting   COPD (chronic obstructive pulmonary disease) ([place])    Depression    Environmental allergies    Family history of breast cancer 05/2014   MyRisk neg; IBIS=29.5%   Fibromyalgia    neuropathy all over   Gastritis 10/08/2013   GERD (gastroesophageal reflux disease)    Hyperlipemia    Hyperlipidemia    Intolerant to statins   Hypothyroidism (acquired)    Increased risk of breast cancer 05/2014   IBIS=29.5%   Increased risk of breast cancer 05/2014   IBIS=29.5%   Migraine    Migraines    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     Family History  Problem Relation Age of Onset   Arthritis/Rheumatoid Mother        died in age 2s   Breast cancer Mother 73   Breast cancer Maternal Grandmother        53's   Breast cancer Maternal Aunt        X 2. 50's   Lung cancer Maternal Aunt    Pancreatitis Neg Hx    Colon cancer Neg Hx    Past Surgical History:  Procedure Laterality Date   ABDOMINAL HYSTERECTOMY  2006   BLADDER REPAIR     BREAST CYST ASPIRATION Left yrs ago   CHOLECYSTECTOMY  1989   COLONOSCOPY WITH ESOPHAGOGASTRODUODENOSCOPY (EGD)     COLONOSCOPY WITH PROPOFOL N/A 08/03/2017   Procedure: COLONOSCOPY WITH PROPOFOL;   Surgeon: Lollie Sails, MD;  Location: [facility] ENDOSCOPY;  Service: Endoscopy;  Laterality: N/A;   COLONOSCOPY WITH PROPOFOL N/A 10/18/2018   Procedure: COLONOSCOPY WITH PROPOFOL;  Surgeon: Lollie Sails, MD;  Location: [facility] ENDOSCOPY;  Service: Endoscopy;  Laterality: N/A;   ESOPHAGOGASTRODUODENOSCOPY N/A 10/07/2013   Procedure: ESOPHAGOGASTRODUODENOSCOPY (EGD);  Surgeon: Danie Binder, MD;  Location: [facility] ENDO SUITE;  Service: Endoscopy;  Laterality: N/A;  patient received heparin at 530am given phenergan 73m IV 30 minutes before   EYE SURGERY     lacrimal gland    FLEXIBLE BRONCHOSCOPY N/A 06/04/2016   Procedure: FLEXIBLE BRONCHOSCOPY;  Surgeon: SAllyne Gee MD;  Location: [facility] ORS;  Service: Pulmonary;  Laterality: N/A;   INCONTINENCE SURGERY  2004   NASAL SINUS SURGERY     THYROID LOBECTOMY     TOTAL THYROIDECTOMY     Social History   Social History Narrative   Not on file   Immunization History  Administered Date(s) Administered   Influenza Inj Mdck Quad Pf  07/22/2018, 07/05/2019, 06/27/2020   Influenza-Unspecified 08/17/2017   Moderna Sars-Covid-2 Vaccination 12/03/2019, 01/24/2020, 08/13/2020, 05/15/2021   Tdap 07/27/2019   Zoster Recombinat (Shingrix) 08/21/2017, 12/08/2017     Objective: Vital Signs: BP 109/73 (BP Location: Left Arm, Patient Position: Sitting, Cuff Size: Normal)   Pulse 73   Ht 5' 5"  (1.651 m)   Wt 187 lb 3.2 oz (84.9 kg)   BMI 31.15 kg/m    Physical Exam Vitals and nursing note reviewed.  Constitutional:      Appearance: She is well-developed.  HENT:     Head: Normocephalic and atraumatic.  Eyes:     Conjunctiva/sclera: Conjunctivae normal.  Cardiovascular:     Rate and Rhythm: Normal rate and regular rhythm.     Heart sounds: Normal heart sounds.  Pulmonary:     Effort: Pulmonary effort is normal.     Breath sounds: Normal breath sounds.  Abdominal:     General: Bowel sounds are normal.     Palpations: Abdomen is soft.   Musculoskeletal:     Cervical back: Normal range of motion.  Lymphadenopathy:     Cervical: No cervical adenopathy.  Skin:    General: Skin is warm and dry.     Capillary Refill: Capillary refill takes less than 2 seconds.  Neurological:     Mental Status: She is alert and oriented to person, place, and time.  Psychiatric:        Behavior: Behavior normal.     Musculoskeletal Exam: C-spine was in good range of motion.  She had painful abduction and internal rotation of her right shoulder joint.  Left shoulder joint smooth range of motion.  Wrist joints, MCPs PIPs and DIPs with good range of motion with no synovitis.  Hip joints, knee joints in good range of motion.  There is no swelling or synovitis in her knee joints, ankles or MTPs.  CDAI Exam: CDAI Score: -- Patient Global: --; Provider Global: -- Swollen: --; Tender: -- Joint Exam 07/12/2021   No joint exam has been documented for this visit   There is currently no information documented on the homunculus. Go to the Rheumatology activity and complete the homunculus joint exam.  Investigation: No additional findings.  Imaging: No results found.  Recent Labs: Lab Results  Component Value Date   WBC 5.1 03/06/2021   HGB 13.0 03/06/2021   PLT 360 03/06/2021   NA 140 03/06/2021   K 4.5 03/06/2021   CL 105 03/06/2021   CO2 25 03/06/2021   GLUCOSE 92 03/06/2021   BUN 16 03/06/2021   CREATININE 0.87 03/06/2021   BILITOT 0.3 03/06/2021   ALKPHOS 79 08/22/2020   AST 27 03/06/2021   ALT 27 03/06/2021   PROT 7.2 03/06/2021   PROT 7.4 03/06/2021   ALBUMIN 4.4 08/22/2020   CALCIUM 9.6 03/06/2021   GFRAA 80 03/06/2021    Speciality Comments: PLQ Eye Exam: 09/28/2020 WNL @ [facility] Follow up in 1 year  Procedures:  Large Joint Inj: R glenohumeral on 07/12/2021 11:03 AM Indications: pain Details: 27 G 1.5 in needle, posterior approach  Arthrogram: No  Medications: 1.5 mL lidocaine 1 %; 40 mg triamcinolone  acetonide 40 MG/ML Aspirate: 0 mL Outcome: tolerated well, no immediate complications Procedure, treatment alternatives, risks and benefits explained, specific risks discussed. Consent was given by the patient. Immediately prior to procedure a time out was called to verify the correct patient, procedure, equipment, support staff and site/side marked as required. Patient was prepped and draped  in the usual sterile fashion.    Allergies: Aspirin, Keflex [cephalexin], Azithromycin, Barley grass, Clarithromycin, Metoclopramide, Oat, Oatmeal, Other, Rye grass flower pollen extract [gramineae pollens], Vioxx [rofecoxib], Wheat bran, Wheat extract, Celebrex [celecoxib], and Metoclopramide hcl   Assessment / Plan:     Visit Diagnoses: Sjogren's syndrome with other organ involvement ([place]) - Diagnosed at [facility] 12 years ago and was placed on Plaquenil.  History of dry mouth, dental decay and dry eyes.  ANA positive, Ro negative, low negative, RF negative.  She continues to have sicca symptoms.  Over-the-counter products were again reviewed and discussed.  High risk medication use - discontinued Plaquenil 3 to 4 months and restarted in May 2022.  Eye examination September 28, 2020 - Plan: CBC with Differential/Platelet, COMPLETE METABOLIC PANEL WITH GFR today and then every 5 months.  Chronic right shoulder pain -she has been having pain and discomfort in her right shoulder joint and difficulty raising her arm bent with internal rotation.  After informed consent was obtained and different treatment options were discussed right shoulder joint was injected with lidocaine and Kenalog as described above.  Postprocedure instructions were given.  A handout on shoulder exercises was given.  Plan: XR Shoulder Right.  X-ray of the shoulder joint was unremarkable.  Primary osteoarthritis of both hands-she can consider joint to stiffness.  Joint protection muscle strengthening was discussed.  Primary osteoarthritis of both  knees-she gives history of ongoing pain and discomfort in her knee joints.  A handout on knee exercises was given.  Primary osteoarthritis of both feet-proper fitting shoes was discussed.  Fibromyalgia-she continues to have some generalized pain and discomfort from fibromyalgia.  Chronic pain syndrome  Other medical problems are listed as follows:  CD (celiac disease)  Nutcracker esophagus  Esophageal spasm  History of gastroesophageal reflux (GERD)  Chronic obstructive pulmonary disease, unspecified COPD type ([place])  Generalized anxiety disorder  MDD (major depressive disorder), recurrent, in partial remission ([place])  History of asthma  History of thyroid cancer  Family history of rheumatoid arthritis  Orders: Orders Placed This Encounter  Procedures   Large Joint Inj   XR Shoulder Right   CBC with Differential/Platelet   COMPLETE METABOLIC PANEL WITH GFR   No orders of the defined types were placed in this encounter.    Follow-Up Instructions: Return in about 5 months (around 12/12/2021) for Sjogren's, Osteoarthritis.   Bo Merino, MD  Note - This record has been created using Editor, commissioning.  Chart creation errors have been sought, but may not always  have been located. Such creation errors do not reflect on  the standard of medical care.

## 2021-07-03 ENCOUNTER — Telehealth (INDEPENDENT_AMBULATORY_CARE_PROVIDER_SITE_OTHER): Payer: Medicare HMO | Admitting: Psychiatry

## 2021-07-03 ENCOUNTER — Encounter: Payer: Self-pay | Admitting: Psychiatry

## 2021-07-03 ENCOUNTER — Other Ambulatory Visit: Payer: Self-pay

## 2021-07-03 DIAGNOSIS — F411 Generalized anxiety disorder: Secondary | ICD-10-CM

## 2021-07-03 DIAGNOSIS — F3342 Major depressive disorder, recurrent, in full remission: Secondary | ICD-10-CM

## 2021-07-03 DIAGNOSIS — R69 Illness, unspecified: Secondary | ICD-10-CM | POA: Diagnosis not present

## 2021-07-03 MED ORDER — VENLAFAXINE HCL ER 150 MG PO CP24: 300.0000 mg | ORAL_CAPSULE | Freq: Every day | ORAL | 1 refills | Status: DC

## 2021-07-03 NOTE — Addendum Note (Signed)
Addended by: [name] on: 07/03/2021 10:20 AM   Modules accepted: Level of Service

## 2021-07-03 NOTE — Patient Instructions (Signed)
1. Continue venlafaxine 300 mg daily  2. Continue amitriptyline 25 mg at night  3. Next appointment: 12/5 at 9 AM  The next visit will be in person visit. Please arrive 15 mins before the scheduled time.   [facility]  Address: [address], [place], [place] [address]
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
## 2021-07-10 ENCOUNTER — Ambulatory Visit: Payer: Medicare HMO | Admitting: Pharmacist

## 2021-07-10 DIAGNOSIS — E039 Hypothyroidism, unspecified: Secondary | ICD-10-CM

## 2021-07-10 DIAGNOSIS — J449 Chronic obstructive pulmonary disease, unspecified: Secondary | ICD-10-CM
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
## 2021-07-12 ENCOUNTER — Encounter: Payer: Self-pay | Admitting: Rheumatology

## 2021-07-12 ENCOUNTER — Ambulatory Visit (INDEPENDENT_AMBULATORY_CARE_PROVIDER_SITE_OTHER): Payer: Medicare HMO | Admitting: Rheumatology

## 2021-07-12 ENCOUNTER — Ambulatory Visit: Payer: Self-pay

## 2021-07-12 ENCOUNTER — Other Ambulatory Visit: Payer: Self-pay

## 2021-07-12 VITALS — BP 109/73 | HR 73 | Ht 65.0 in | Wt 187.2 lb

## 2021-07-12 DIAGNOSIS — M170 Bilateral primary osteoarthritis of knee: Secondary | ICD-10-CM | POA: Diagnosis not present

## 2021-07-12 DIAGNOSIS — K224 Dyskinesia of esophagus: Secondary | ICD-10-CM | POA: Diagnosis not present

## 2021-07-12 DIAGNOSIS — K900 Celiac disease: Secondary | ICD-10-CM

## 2021-07-12 DIAGNOSIS — M19072 Primary osteoarthritis, left ankle and foot: Secondary | ICD-10-CM

## 2021-07-12 DIAGNOSIS — M797 Fibromyalgia: Secondary | ICD-10-CM | POA: Diagnosis not present

## 2021-07-12 DIAGNOSIS — M3509 Sicca syndrome with other organ involvement: Secondary | ICD-10-CM

## 2021-07-12 DIAGNOSIS — M25511 Pain in right shoulder: Secondary | ICD-10-CM

## 2021-07-12 DIAGNOSIS — G894 Chronic pain syndrome: Secondary | ICD-10-CM | POA: Diagnosis not present

## 2021-07-12 DIAGNOSIS — Z79899 Other long term (current) drug therapy: Secondary | ICD-10-CM

## 2021-07-12 DIAGNOSIS — Z8261 Family history of arthritis: Secondary | ICD-10-CM

## 2021-07-12 DIAGNOSIS — J449 Chronic obstructive pulmonary disease, unspecified: Secondary | ICD-10-CM

## 2021-07-12 DIAGNOSIS — F411 Generalized anxiety disorder: Secondary | ICD-10-CM

## 2021-07-12 DIAGNOSIS — M19042 Primary osteoarthritis, left hand: Secondary | ICD-10-CM

## 2021-07-12 DIAGNOSIS — G8929 Other chronic pain: Secondary | ICD-10-CM | POA: Diagnosis not present

## 2021-07-12 DIAGNOSIS — Z85850 Personal history of malignant neoplasm of thyroid: Secondary | ICD-10-CM

## 2021-07-12 DIAGNOSIS — F3341 Major depressive disorder, recurrent, in partial remission: Secondary | ICD-10-CM

## 2021-07-12 DIAGNOSIS — M19071 Primary osteoarthritis, right ankle and foot: Secondary | ICD-10-CM

## 2021-07-12 DIAGNOSIS — M19041 Primary osteoarthritis, right hand: Secondary | ICD-10-CM | POA: Diagnosis not present

## 2021-07-12 DIAGNOSIS — Z8709 Personal history of other diseases of the respiratory system: Secondary | ICD-10-CM

## 2021-07-12 DIAGNOSIS — Z8719 Personal history of other diseases of the digestive system: Secondary | ICD-10-CM

## 2021-07-12 MED ORDER — TRIAMCINOLONE ACETONIDE 40 MG/ML IJ SUSP
40.0000 mg | INTRAMUSCULAR | Status: AC | PRN
  Administered 2021-07-12: 40 mg via INTRA_ARTICULAR

## 2021-07-12 MED ORDER — LIDOCAINE HCL 1 % IJ SOLN
1.5000 mL | INTRAMUSCULAR | Status: AC | PRN
  Administered 2021-07-12: 1.5 mL

## 2021-07-12 NOTE — Patient Instructions (Addendum)
Vaccines You are taking a medication(s) that can suppress your immune system.  The following immunizations are recommended: Flu annually Covid-19  Td/Tdap (tetanus, diphtheria, pertussis) every 10 years Pneumonia (Prevnar 15 then Pneumovax 23 at least 1 year apart.  Alternatively, can take Prevnar 20 without needing additional dose) Shingrix: 2 doses from 4 weeks to 6 months apart  Please check with your PCP to make sure you are up to date.   Knee Exercises Ask your health care provider which exercises are safe for you. Do exercises exactly as told by your health care provider and adjust them as directed. It is normal to feel mild stretching, pulling, tightness, or discomfort as you do these exercises. Stop right away if you feel sudden pain or your pain gets worse. Do not begin these exercises until told by your health care provider. Stretching and range-of-motion exercises These exercises warm up your muscles and joints and improve the movement and flexibility of your knee. These exercises also help to relieve pain and swelling. Knee extension, prone Lie on your abdomen (prone position) on a bed. Place your left / right knee just beyond the edge of the surface so your knee is not on the bed. You can put a towel under your left / right thigh just above your kneecap for comfort. Relax your leg muscles and allow gravity to straighten your knee (extension). You should feel a stretch behind your left / right knee. Hold this position for __________ seconds. Scoot up so your knee is supported between repetitions. Repeat __________ times. Complete this exercise __________ times a day. Knee flexion, active  Lie on your back with both legs straight. If this causes back discomfort, bend your left / right knee so your foot is flat on the floor. Slowly slide your left / right heel back toward your buttocks. Stop when you feel a gentle stretch in the front of your knee or thigh (flexion). Hold this  position for __________ seconds. Slowly slide your left / right heel back to the starting position. Repeat __________ times. Complete this exercise __________ times a day. Quadriceps stretch, prone  Lie on your abdomen on a firm surface, such as a bed or padded floor. Bend your left / right knee and hold your ankle. If you cannot reach your ankle or pant leg, loop a belt around your foot and grab the belt instead. Gently pull your heel toward your buttocks. Your knee should not slide out to the side. You should feel a stretch in the front of your thigh and knee (quadriceps). Hold this position for __________ seconds. Repeat __________ times. Complete this exercise __________ times a day. Hamstring, supine Lie on your back (supine position). Loop a belt or towel over the ball of your left / right foot. The ball of your foot is on the walking surface, right under your toes. Straighten your left / right knee and slowly pull on the belt to raise your leg until you feel a gentle stretch behind your knee (hamstring). Do not let your knee bend while you do this. Keep your other leg flat on the floor. Hold this position for __________ seconds. Repeat __________ times. Complete this exercise __________ times a day. Strengthening exercises These exercises build strength and endurance in your knee. Endurance is the ability to use your muscles for a long time, even after they get tired. Quadriceps, isometric This exercise stretches the muscles in front of your thigh (quadriceps) without moving your knee joint (isometric). Lie on  your back with your left / right leg extended and your other knee bent. Put a rolled towel or small pillow under your knee if told by your health care provider. Slowly tense the muscles in the front of your left / right thigh. You should see your kneecap slide up toward your hip or see increased dimpling just above the knee. This motion will push the back of the knee toward the  floor. For __________ seconds, hold the muscle as tight as you can without increasing your pain. Relax the muscles slowly and completely. Repeat __________ times. Complete this exercise __________ times a day. Straight leg raises This exercise stretches the muscles in front of your thigh (quadriceps) and the muscles that move your hips (hip flexors). Lie on your back with your left / right leg extended and your other knee bent. Tense the muscles in the front of your left / right thigh. You should see your kneecap slide up or see increased dimpling just above the knee. Your thigh may even shake a bit. Keep these muscles tight as you raise your leg 4-6 inches (10-15 cm) off the floor. Do not let your knee bend. Hold this position for __________ seconds. Keep these muscles tense as you lower your leg. Relax your muscles slowly and completely after each repetition. Repeat __________ times. Complete this exercise __________ times a day. Hamstring, isometric Lie on your back on a firm surface. Bend your left / right knee about __________ degrees. Dig your left / right heel into the surface as if you are trying to pull it toward your buttocks. Tighten the muscles in the back of your thighs (hamstring) to "dig" as hard as you can without increasing any pain. Hold this position for __________ seconds. Release the tension gradually and allow your muscles to relax completely for __________ seconds after each repetition. Repeat __________ times. Complete this exercise __________ times a day. Hamstring curls If told by your health care provider, do this exercise while wearing ankle weights. Begin with __________ lb weights. Then increase the weight by 1 lb (0.5 kg) increments. Do not wear ankle weights that are more than __________ lb. Lie on your abdomen with your legs straight. Bend your left / right knee as far as you can without feeling pain. Keep your hips flat against the floor. Hold this position  for __________ seconds. Slowly lower your leg to the starting position. Repeat __________ times. Complete this exercise __________ times a day. Squats This exercise strengthens the muscles in front of your thigh and knee (quadriceps). Stand in front of a table, with your feet and knees pointing straight ahead. You may rest your hands on the table for balance but not for support. Slowly bend your knees and lower your hips like you are going to sit in a chair. Keep your weight over your heels, not over your toes. Keep your lower legs upright so they are parallel with the table legs. Do not let your hips go lower than your knees. Do not bend lower than told by your health care provider. If your knee pain increases, do not bend as low. Hold the squat position for __________ seconds. Slowly push with your legs to return to standing. Do not use your hands to pull yourself to standing. Repeat __________ times. Complete this exercise __________ times a day. Wall slides This exercise strengthens the muscles in front of your thigh and knee (quadriceps). Lean your back against a smooth wall or door, and walk your  feet out 18-24 inches (46-61 cm) from it. Place your feet hip-width apart. Slowly slide down the wall or door until your knees bend __________ degrees. Keep your knees over your heels, not over your toes. Keep your knees in line with your hips. Hold this position for __________ seconds. Repeat __________ times. Complete this exercise __________ times a day. Straight leg raises This exercise strengthens the muscles that rotate the leg at the hip and move it away from your body (hip abductors). Lie on your side with your left / right leg in the top position. Lie so your head, shoulder, knee, and hip line up. You may bend your bottom knee to help you keep your balance. Roll your hips slightly forward so your hips are stacked directly over each other and your left / right knee is facing  forward. Leading with your heel, lift your top leg 4-6 inches (10-15 cm). You should feel the muscles in your outer hip lifting. Do not let your foot drift forward. Do not let your knee roll toward the ceiling. Hold this position for __________ seconds. Slowly return your leg to the starting position. Let your muscles relax completely after each repetition. Repeat __________ times. Complete this exercise __________ times a day. Straight leg raises This exercise stretches the muscles that move your hips away from the front of the pelvis (hip extensors). Lie on your abdomen on a firm surface. You can put a pillow under your hips if that is more comfortable. Tense the muscles in your buttocks and lift your left / right leg about 4-6 inches (10-15 cm). Keep your knee straight as you lift your leg. Hold this position for __________ seconds. Slowly lower your leg to the starting position. Let your leg relax completely after each repetition. Repeat __________ times. Complete this exercise __________ times a day. This information is not intended to replace advice given to you by your health care provider. Make sure you discuss any questions you have with your health care provider. Document Revised: 08/03/2018 Document Reviewed: 08/03/2018 Elsevier Patient Education  2022 [place]. Shoulder Exercises Ask your health care provider which exercises are safe for you. Do exercises exactly as told by your health care provider and adjust them as directed. It is normal to feel mild stretching, pulling, tightness, or discomfort as you do these exercises. Stop right away if you feel sudden pain or your pain gets worse. Do not begin these exercises until told by your health care provider. Stretching exercises External rotation and abduction This exercise is sometimes called corner stretch. This exercise rotates your arm outward (external rotation) and moves your arm out from your body (abduction). Stand in  a doorway with one of your feet slightly in front of the other. This is called a staggered stance. If you cannot reach your forearms to the door frame, stand facing a corner of a room. Choose one of the following positions as told by your health care provider: Place your hands and forearms on the door frame above your head. Place your hands and forearms on the door frame at the height of your head. Place your hands on the door frame at the height of your elbows. Slowly move your weight onto your front foot until you feel a stretch across your chest and in the front of your shoulders. Keep your head and chest upright and keep your abdominal muscles tight. Hold for __________ seconds. To release the stretch, shift your weight to your back foot. Repeat __________  times. Complete this exercise __________ times a day. Extension, standing Stand and hold a broomstick, a cane, or a similar object behind your back. Your hands should be a little wider than shoulder width apart. Your palms should face away from your back. Keeping your elbows straight and your shoulder muscles relaxed, move the stick away from your body until you feel a stretch in your shoulders (extension). Avoid shrugging your shoulders while you move the stick. Keep your shoulder blades tucked down toward the middle of your back. Hold for __________ seconds. Slowly return to the starting position. Repeat __________ times. Complete this exercise __________ times a day. Range-of-motion exercises Pendulum  Stand near a wall or a surface that you can hold onto for balance. Bend at the waist and let your left / right arm hang straight down. Use your other arm to support you. Keep your back straight and do not lock your knees. Relax your left / right arm and shoulder muscles, and move your hips and your trunk so your left / right arm swings freely. Your arm should swing because of the motion of your body, not because you are using your arm  or shoulder muscles. Keep moving your hips and trunk so your arm swings in the following directions, as told by your health care provider: Side to side. Forward and backward. In clockwise and counterclockwise circles. Continue each motion for __________ seconds, or for as long as told by your health care provider. Slowly return to the starting position. Repeat __________ times. Complete this exercise __________ times a day. Shoulder flexion, standing  Stand and hold a broomstick, a cane, or a similar object. Place your hands a little more than shoulder width apart on the object. Your left / right hand should be palm up, and your other hand should be palm down. Keep your elbow straight and your shoulder muscles relaxed. Push the stick up with your healthy arm to raise your left / right arm in front of your body, and then over your head until you feel a stretch in your shoulder (flexion). Avoid shrugging your shoulder while you raise your arm. Keep your shoulder blade tucked down toward the middle of your back. Hold for __________ seconds. Slowly return to the starting position. Repeat __________ times. Complete this exercise __________ times a day. Shoulder abduction, standing Stand and hold a broomstick, a cane, or a similar object. Place your hands a little more than shoulder width apart on the object. Your left / right hand should be palm up, and your other hand should be palm down. Keep your elbow straight and your shoulder muscles relaxed. Push the object across your body toward your left / right side. Raise your left / right arm to the side of your body (abduction) until you feel a stretch in your shoulder. Do not raise your arm above shoulder height unless your health care provider tells you to do that. If directed, raise your arm over your head. Avoid shrugging your shoulder while you raise your arm. Keep your shoulder blade tucked down toward the middle of your back. Hold for __________  seconds. Slowly return to the starting position. Repeat __________ times. Complete this exercise __________ times a day. Internal rotation  Place your left / right hand behind your back, palm up. Use your other hand to dangle an exercise band, a towel, or a similar object over your shoulder. Grasp the band with your left / right hand so you are holding on to both  ends. Gently pull up on the band until you feel a stretch in the front of your left / right shoulder. The movement of your arm toward the center of your body is called internal rotation. Avoid shrugging your shoulder while you raise your arm. Keep your shoulder blade tucked down toward the middle of your back. Hold for __________ seconds. Release the stretch by letting go of the band and lowering your hands. Repeat __________ times. Complete this exercise __________ times a day. Strengthening exercises External rotation  Sit in a stable chair without armrests. Secure an exercise band to a stable object at elbow height on your left / right side. Place a soft object, such as a folded towel or a small pillow, between your left / right upper arm and your body to move your elbow about 4 inches (10 cm) away from your side. Hold the end of the exercise band so it is tight and there is no slack. Keeping your elbow pressed against the soft object, slowly move your forearm out, away from your abdomen (external rotation). Keep your body steady so only your forearm moves. Hold for __________ seconds. Slowly return to the starting position. Repeat __________ times. Complete this exercise __________ times a day. Shoulder abduction  Sit in a stable chair without armrests, or stand up. Hold a __________ weight in your left / right hand, or hold an exercise band with both hands. Start with your arms straight down and your left / right palm facing in, toward your body. Slowly lift your left / right hand out to your side (abduction). Do not lift  your hand above shoulder height unless your health care provider tells you that this is safe. Keep your arms straight. Avoid shrugging your shoulder while you do this movement. Keep your shoulder blade tucked down toward the middle of your back. Hold for __________ seconds. Slowly lower your arm, and return to the starting position. Repeat __________ times. Complete this exercise __________ times a day. Shoulder extension Sit in a stable chair without armrests, or stand up. Secure an exercise band to a stable object in front of you so it is at shoulder height. Hold one end of the exercise band in each hand. Your palms should face each other. Straighten your elbows and lift your hands up to shoulder height. Step back, away from the secured end of the exercise band, until the band is tight and there is no slack. Squeeze your shoulder blades together as you pull your hands down to the sides of your thighs (extension). Stop when your hands are straight down by your sides. Do not let your hands go behind your body. Hold for __________ seconds. Slowly return to the starting position. Repeat __________ times. Complete this exercise __________ times a day. Shoulder row Sit in a stable chair without armrests, or stand up. Secure an exercise band to a stable object in front of you so it is at waist height. Hold one end of the exercise band in each hand. Position your palms so that your thumbs are facing the ceiling (neutral position). Bend each of your elbows to a 90-degree angle (right angle) and keep your upper arms at your sides. Step back until the band is tight and there is no slack. Slowly pull your elbows back behind you. Hold for __________ seconds. Slowly return to the starting position. Repeat __________ times. Complete this exercise __________ times a day. Shoulder press-ups  Sit in a stable chair that has armrests. Sit  upright, with your feet flat on the floor. Put your hands on the  armrests so your elbows are bent and your fingers are pointing forward. Your hands should be about even with the sides of your body. Push down on the armrests and use your arms to lift yourself off the chair. Straighten your elbows and lift yourself up as much as you comfortably can. Move your shoulder blades down, and avoid letting your shoulders move up toward your ears. Keep your feet on the ground. As you get stronger, your feet should support less of your body weight as you lift yourself up. Hold for __________ seconds. Slowly lower yourself back into the chair. Repeat __________ times. Complete this exercise __________ times a day. Wall push-ups  Stand so you are facing a stable wall. Your feet should be about one arm-length away from the wall. Lean forward and place your palms on the wall at shoulder height. Keep your feet flat on the floor as you bend your elbows and lean forward toward the wall. Hold for __________ seconds. Straighten your elbows to push yourself back to the starting position. Repeat __________ times. Complete this exercise __________ times a day. This information is not intended to replace advice given to you by your health care provider. Make sure you discuss any questions you have with your health care provider. Document Revised: 02/04/2019 Document Reviewed: 11/12/2018 Elsevier Patient Education  [place].

## 2021-07-13 LAB — CBC WITH DIFFERENTIAL/PLATELET
Absolute Monocytes: 513 cells/uL (ref 200–950)
Basophils Absolute: 40 cells/uL (ref 0–200)
Basophils Relative: 0.7 %
Eosinophils Absolute: 239 cells/uL (ref 15–500)
Eosinophils Relative: 4.2 %
HCT: 39.5 % (ref 35.0–45.0)
Hemoglobin: 13 g/dL (ref 11.7–15.5)
Lymphs Abs: 1944 cells/uL (ref 850–3900)
MCH: 29.3 pg (ref 27.0–33.0)
MCHC: 32.9 g/dL (ref 32.0–36.0)
MCV: 89.2 fL (ref 80.0–100.0)
MPV: 9.1 fL (ref 7.5–12.5)
Monocytes Relative: 9 %
Neutro Abs: 2964 cells/uL (ref 1500–7800)
Neutrophils Relative %: 52 %
Platelets: 359 10*3/uL (ref 140–400)
RBC: 4.43 10*6/uL (ref 3.80–5.10)
RDW: 12.5 % (ref 11.0–15.0)
Total Lymphocyte: 34.1 %
WBC: 5.7 10*3/uL (ref 3.8–10.8)

## 2021-07-13 LAB — COMPLETE METABOLIC PANEL WITH GFR
AG Ratio: 1.6 (calc) (ref 1.0–2.5)
ALT: 19 U/L (ref 6–29)
AST: 20 U/L (ref 10–35)
Albumin: 4.5 g/dL (ref 3.6–5.1)
Alkaline phosphatase (APISO): 61 U/L (ref 37–153)
BUN: 18 mg/dL (ref 7–25)
CO2: 28 mmol/L (ref 20–32)
Calcium: 9.5 mg/dL (ref 8.6–10.4)
Chloride: 105 mmol/L (ref 98–110)
Creat: 0.98 mg/dL (ref 0.50–1.05)
Globulin: 2.9 g/dL (calc) (ref 1.9–3.7)
Glucose, Bld: 90 mg/dL (ref 65–99)
Potassium: 4.7 mmol/L (ref 3.5–5.3)
Sodium: 139 mmol/L (ref 135–146)
Total Bilirubin: 0.3 mg/dL (ref 0.2–1.2)
Total Protein: 7.4 g/dL (ref 6.1–8.1)
eGFR: 64 mL/min/{1.73_m2} (ref >=60)

## 2021-07-15 ENCOUNTER — Telehealth: Payer: Self-pay

## 2021-07-15 NOTE — Telephone Encounter (Signed)
Returned the patient's call and advised her of recent lab results. Patient verbalized understanding.

## 2021-07-15 NOTE — Telephone Encounter (Signed)
Patient called stating she was returning Marissa's call regarding her labwork results.

## 2021-07-19 ENCOUNTER — Ambulatory Visit (INDEPENDENT_AMBULATORY_CARE_PROVIDER_SITE_OTHER): Payer: Medicare HMO | Admitting: Nurse Practitioner

## 2021-07-19 ENCOUNTER — Encounter: Payer: Self-pay | Admitting: Nurse Practitioner

## 2021-07-19 ENCOUNTER — Other Ambulatory Visit: Payer: Self-pay

## 2021-07-19 VITALS — BP 96/70 | HR 70 | Temp 98.6°F | Resp 16 | Ht 65.0 in | Wt 185.8 lb

## 2021-07-19 DIAGNOSIS — E559 Vitamin D deficiency, unspecified: Secondary | ICD-10-CM

## 2021-07-19 DIAGNOSIS — Z23 Encounter for immunization: Secondary | ICD-10-CM | POA: Diagnosis not present

## 2021-07-19 DIAGNOSIS — E039 Hypothyroidism, unspecified: Secondary | ICD-10-CM | POA: Diagnosis not present

## 2021-07-19 DIAGNOSIS — E538 Deficiency of other specified B group vitamins: Secondary | ICD-10-CM | POA: Diagnosis not present

## 2021-07-19 DIAGNOSIS — R5383 Other fatigue: Secondary | ICD-10-CM

## 2021-07-19 MED ORDER — LEVOTHYROXINE SODIUM 125 MCG PO TABS: 125.0000 ug | ORAL_TABLET | Freq: Every day | ORAL | 3 refills | Status: DC

## 2021-07-19 MED ORDER — FOLIC ACID 1 MG PO TABS: 1.0000 mg | ORAL_TABLET | Freq: Every day | ORAL | 1 refills | Status: DC

## 2021-07-19 MED ORDER — VITAMIN D (ERGOCALCIFEROL) 1.25 MG (50000 UNIT) PO CAPS: 50000.0000 [IU] | ORAL_CAPSULE | ORAL | 1 refills | Status: DC

## 2021-07-19 MED ORDER — CYANOCOBALAMIN 1000 MCG/ML IJ SOLN: 1000.0000 ug | Freq: Once | INTRAMUSCULAR | Status: DC

## 2021-07-19 NOTE — Progress Notes (Signed)
[facility] [place], [place] [address]  Internal MEDICINE  Office Visit Note  Patient Name: Susan [name]  [id number]  [id number]  Date of Service: 07/19/2021  Chief Complaint  Patient presents with   Follow-up    Refills, thyroid, tired, fatigued    Depression   Gastroesophageal Reflux   Hyperlipidemia   Anxiety   Asthma   COPD    HPI Avanti presents for a follow up visit for medication refills and to discuss fatigue and thyroid. She has a history of asthma and COPD. She also has a history of anxiety, arthritis, cancer, depression GERD, hyperlipidemia, hypothyroidism, and celiac disease. Her bone density scan was normal. Her labs showed low vitamin D.  Folate level was 2.4 in June this year. B12 was borderline low at 264 in June. Vitamin D level was slightly low at 28.1.  TSH level is very low at 0.056 in June 2022   Current Medication: Outpatient Encounter Medications as of 07/19/2021  Medication Sig   acetaminophen (TYLENOL) 500 MG tablet Take by mouth as needed.   albuterol (PROVENTIL HFA;VENTOLIN HFA) 108 (90 Base) MCG/ACT inhaler Inhale 2 puffs into the lungs every 6 (six) hours as needed for wheezing or shortness of breath.   albuterol (PROVENTIL) (2.5 MG/3ML) 0.083% nebulizer solution Take 3 mLs (2.5 mg total) by nebulization every 6 (six) hours as needed for wheezing or shortness of breath.   ALPRAZolam (XANAX) 0.5 MG tablet Take 0.5 mg by mouth 3 (three) times daily as needed.   amitriptyline (ELAVIL) 25 MG tablet Take 1 tablet (25 mg total) by mouth at bedtime.   Ascorbic Acid (VITAMIN C) 100 MG tablet Take 100 mg by mouth daily.    BREO ELLIPTA 100-25 MCG/INH AEPB 1 PUFF BY MOUTH ONCE DAILY   carisoprodol (SOMA) 350 MG tablet Take 350 mg by mouth at bedtime. Patient states that she takes 1/2 tablet nightly    Dentifrices (BIOTENE DRY MOUTH DT) by Transmucosal route.   eletriptan (RELPAX) 40 MG tablet Take 40 mg by mouth as needed for  migraine. may repeat in 2 hours if necessary    EPINEPHRINE 0.3 mg/0.3 mL IJ SOAJ injection INJECT CONTENTS OF 1 PEN AS NEEDED FOR ALLERGIC REACTION   estradiol (CLIMARA) 0.1 mg/24hr patch APPLY 1 PATCH TO SKIN ONCE WEEKLY   ezetimibe (ZETIA) 10 MG tablet Take 1 tablet (10 mg total) by mouth daily.   famotidine (PEPCID) 10 MG tablet Take 20 mg by mouth.    fenofibrate (TRICOR) 145 MG tablet Take 1 tablet (145 mg total) by mouth daily.   fluticasone (FLONASE) 50 MCG/ACT nasal spray Place 2 sprays into both nostrils daily.   gabapentin (NEURONTIN) 600 MG tablet Take 1/2 (one-half) tablet by mouth twice daily   HYDROcodone-acetaminophen (NORCO) 7.5-325 MG tablet Take 1-2 tablets by mouth every 6 (six) hours as needed for moderate pain.   hydroxychloroquine (PLAQUENIL) 200 MG tablet Take 1 tablet (200 mg total) by mouth 2 (two) times daily.   hyoscyamine (ANASPAZ) 0.125 MG TBDP disintergrating tablet Place 0.125 mg under the tongue every 4 (four) hours as needed for bladder spasms.    Lactobacillus (DIGESTIVE HEALTH PROBIOTIC PO) Take 1 tablet by mouth daily.    levothyroxine (SYNTHROID) 125 MCG tablet Take 1 tablet (125 mcg total) by mouth daily.   LINZESS 290 MCG CAPS capsule Take 290 mcg by mouth daily.    Magnesium Oxide (NATRUL MAGNESIUM PO) Take by mouth.    montelukast (SINGULAIR) 10 MG  tablet TAKE ONE TABLET BY MOUTH ONCE DAILY FOR ASTHMA   Multiple Vitamins-Minerals (ZINC PO) Take by mouth daily.   mupirocin ointment (BACTROBAN) 2 % as needed.    pantoprazole (PROTONIX) 40 MG tablet Take 40 mg by mouth daily.    Polyethyl Glycol-Propyl Glycol 0.4-0.3 % SOLN Apply to eye.    PRESCRIPTION MEDICATION 1 Syringe by Subdermal route once a week. Allergy shot    Probiotic Product ([place]) CAPS Take by mouth.    sodium chloride (OCEAN) 0.65 % SOLN nasal spray Place 1 spray into both nostrils as needed for congestion.    sodium chloride 0.9 % nebulizer solution     SUMAtriptan-naproxen (TREXIMET) 85-500 MG tablet daily as needed.    Tuberculin-Allergy Syringes 28G X 1/2" 1 ML MISC 1 each by Does not apply route every 14 (fourteen) days. For allergy injections   Vaginal Lubricant (REPLENS) GEL Place vaginally.    [START ON 08/19/2021] venlafaxine XR (EFFEXOR-XR) 150 MG 24 hr capsule Take 2 capsules (300 mg total) by mouth at bedtime.   vitamin E 1000 UNIT capsule Take by mouth.    White Petrolatum-Mineral Oil ([place] PETROL-MINERAL OIL-LANOLIN) 0.1-0.1 % OINT    [DISCONTINUED] folic acid (FOLVITE) 1 MG tablet Take 1 tablet (1 mg total) by mouth daily.   [DISCONTINUED] levothyroxine (SYNTHROID) 137 MCG tablet Take 1 tablet (137 mcg total) by mouth daily before breakfast.   [DISCONTINUED] Vitamin D, Ergocalciferol, (DRISDOL) 1.25 MG (50000 UNIT) CAPS capsule Take 1 capsule (50,000 Units total) by mouth every 7 (seven) days. Monday   folic acid (FOLVITE) 1 MG tablet Take 1 tablet (1 mg total) by mouth daily.   Vitamin D, Ergocalciferol, (DRISDOL) 1.25 MG (50000 UNIT) CAPS capsule Take 1 capsule (50,000 Units total) by mouth every 7 (seven) days. Monday   [DISCONTINUED] calcium carbonate (OS-CAL) 1250 (500 Ca) MG chewable tablet Chew by mouth. (Patient not taking: Reported on 07/19/2021)   Facility-Administered Encounter Medications as of 07/19/2021  Medication   cyanocobalamin ((VITAMIN B-12)) injection 1,000 mcg    Surgical History: Past Surgical History:  Procedure Laterality Date   ABDOMINAL HYSTERECTOMY  2006   BLADDER REPAIR     BREAST CYST ASPIRATION Left yrs ago   [place] ESOPHAGOGASTRODUODENOSCOPY (EGD)     COLONOSCOPY WITH PROPOFOL N/A 08/03/2017   Procedure: COLONOSCOPY WITH PROPOFOL;  Surgeon: Lollie Sails, MD;  Location: [facility] ENDOSCOPY;  Service: Endoscopy;  Laterality: N/A;   COLONOSCOPY WITH PROPOFOL N/A 10/18/2018   Procedure: COLONOSCOPY WITH PROPOFOL;  Surgeon: Lollie Sails, MD;  Location: [facility]  ENDOSCOPY;  Service: Endoscopy;  Laterality: N/A;   ESOPHAGOGASTRODUODENOSCOPY N/A 10/07/2013   Procedure: ESOPHAGOGASTRODUODENOSCOPY (EGD);  Surgeon: Danie Binder, MD;  Location: [facility] ENDO SUITE;  Service: Endoscopy;  Laterality: N/A;  patient received heparin at 530am given phenergan 65m IV 30 minutes before   EYE SURGERY     lacrimal gland    FLEXIBLE BRONCHOSCOPY N/A 06/04/2016   Procedure: FLEXIBLE BRONCHOSCOPY;  Surgeon: SAllyne Gee MD;  Location: [facility] ORS;  Service: Pulmonary;  Laterality: N/A;   INCONTINENCE SURGERY  2004   NASAL SINUS SURGERY     THYROID LOBECTOMY     TOTAL THYROIDECTOMY      Medical History: Past Medical History:  Diagnosis Date   Arthritis    Asthma    Asthmatic bronchitis    BRCA negative 05/2014   MyRisk neg   Celiac disease    Celiac sprue  Chronic anxiety    Complication of anesthesia    vomiting   COPD (chronic obstructive pulmonary disease) (HCC)    Depression    Environmental allergies    Family history of breast cancer 05/2014   MyRisk neg; IBIS=29.5%   Fibromyalgia    neuropathy all over   Gastritis 10/08/2013   GERD (gastroesophageal reflux disease)    Hyperlipemia    Hyperlipidemia    Intolerant to statins   Hypothyroidism (acquired)    Increased risk of breast cancer 05/2014   IBIS=29.5%   Increased risk of breast cancer 05/2014   IBIS=29.5%   Migraine    Migraines    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     Family History: Family History  Problem Relation Age of Onset   Arthritis/Rheumatoid Mother        died in age 94s   Breast cancer Mother 73   Breast cancer Maternal Grandmother        72's   Breast cancer Maternal Aunt        X 2. 50's   Lung cancer Maternal Aunt    Pancreatitis Neg Hx    Colon cancer Neg Hx     Social History   Socioeconomic History   Marital  status: Married    Spouse name: Not on file   Number of children: 0   Years of education: Not on file   Highest education level: Not on file  Occupational History   Not on file  Tobacco Use   Smoking status: Never   Smokeless tobacco: Never  Vaping Use   Vaping Use: Never used  Substance and Sexual Activity   Alcohol use: No   Drug use: No   Sexual activity: Yes    Birth control/protection: Surgical  Other Topics Concern   Not on file  Social History Narrative   Not on file   Social Determinants of Health   Financial Resource Strain: Low Risk    Difficulty of Paying Living Expenses: Not very hard  Food Insecurity: Not on file  Transportation Needs: Not on file  Physical Activity: Not on file  Stress: Not on file  Social Connections: Not on file  Intimate Partner Violence: Not on file      Review of Systems  Constitutional:  Positive for fatigue. Negative for chills and unexpected weight change.  HENT:  Negative for congestion, rhinorrhea, sneezing and sore throat.   Eyes:  Negative for redness.  Respiratory:  Negative for cough, chest tightness, shortness of breath and wheezing.   Cardiovascular:  Negative for chest pain and palpitations.  Gastrointestinal:  Negative for abdominal pain, constipation, diarrhea, nausea and vomiting.  Genitourinary:  Negative for dysuria and frequency.  Musculoskeletal:  Negative for arthralgias, back pain, joint swelling and neck pain.  Skin:  Negative for rash.  Neurological: Negative.  Negative for tremors and numbness.  Hematological:  Negative for adenopathy. Does not bruise/bleed easily.  Psychiatric/Behavioral:  Negative for behavioral problems (Depression), sleep disturbance and suicidal ideas. The patient is not nervous/anxious.    Vital Signs: BP 96/70   Pulse 70   Temp 98.6 F (37 C)   Resp 16   Ht 5' 5"  (1.651 m)   Wt 185 lb 12.8 oz (84.3 kg)   SpO2 98%   BMI 30.92 kg/m    Physical Exam  Vitals reviewed.   Constitutional:      General: She is not in acute distress.    Appearance: Normal appearance. She is obese. She is not ill-appearing.  Cardiovascular:     Rate and Rhythm: Normal rate and regular rhythm.     Pulses: Normal pulses.     Heart sounds: Normal heart sounds. No murmur heard. Pulmonary:     Effort: Pulmonary effort is normal. No respiratory distress.     Breath sounds: Normal breath sounds.  Skin:    General: Skin is warm and dry.     Capillary Refill: Capillary refill takes less than 2 seconds.  Neurological:     Mental Status: She is alert and oriented to person, place, and time.  Psychiatric:        Mood and Affect: Mood normal.        Behavior: Behavior normal.       Assessment/Plan: 1. Acquired hypothyroidism Levothyroxine refilled, recheck levels in 6 weeks, follow up in 8 weeks to discuss.  - levothyroxine (SYNTHROID) 125 MCG tablet; Take 1 tablet (125 mcg total) by mouth daily.  Dispense: 90 tablet; Refill: 3 - TSH + free T4  2. Folate deficiency Folice acid refill ordered, folate level recheck in 6 weeks, follow up in 8 weeks to discuss - folic acid (FOLVITE) 1 MG tablet; Take 1 tablet (1 mg total) by mouth daily.  Dispense: 90 tablet; Refill: 1 - B12 and Folate Panel  3. B12 deficiency B12 injection administered in office today. Lab ordered to recheck level in 6 weeks follow up in 8 weeks to discuss - cyanocobalamin ((VITAMIN B-12)) injection 1,000 mcg - B12 and Folate Panel  4. Vitamin D deficiency Vitamin D refilled. Recheck lab in 6 weeks. Follow up in 8 weeks to discuss - Vitamin D, Ergocalciferol, (DRISDOL) 1.25 MG (50000 UNIT) CAPS capsule; Take 1 capsule (50,000 Units total) by mouth every 7 (seven) days. Monday  Dispense: 12 capsule; Refill: 1 - Vitamin D (25 hydroxy)  5. Needs flu shot Administered in office today - Flu Vaccine MDCK QUAD PF   General Counseling: Rosa verbalizes understanding of the findings of todays visit and agrees  with plan of treatment. I have discussed any further diagnostic evaluation that may be needed or ordered today. We also reviewed her medications today. she has been encouraged to call the office with any questions or concerns that should arise related to todays visit.    Orders Placed This Encounter  Procedures   Flu Vaccine MDCK QUAD PF   TSH + free T4   B12 and Folate Panel   Vitamin D (25 hydroxy)    Meds ordered this encounter  Medications   folic acid (FOLVITE) 1 MG tablet    Sig: Take 1 tablet (1 mg total) by mouth daily.    Dispense:  90 tablet    Refill:  1   Vitamin D, Ergocalciferol, (DRISDOL) 1.25 MG (50000 UNIT) CAPS capsule    Sig: Take 1 capsule (50,000 Units total) by mouth every 7 (seven) days. Monday    Dispense:  12 capsule    Refill:  1   cyanocobalamin ((VITAMIN B-12)) injection 1,000 mcg   levothyroxine (SYNTHROID) 125 MCG tablet    Sig: Take 1 tablet (125 mcg total) by mouth daily.    Dispense:  90 tablet    Refill:  3    Return in about 8 weeks (around 09/13/2021) for F/U, Review labs/test, [place] PCP.   Total time spent:20 Minutes Time  spent includes review of chart, medications, test results, and follow up plan with the patient.   [place] Controlled Substance Database was reviewed by me.  This patient was seen by [name], FNP-C in collaboration with Dr. Clayborn Bigness as a part of collaborative care agreement.   Jud Fanguy R. Valetta Fuller, MSN, FNP-C Internal medicine

## 2021-07-24 DIAGNOSIS — G4733 Obstructive sleep apnea (adult) (pediatric): Secondary | ICD-10-CM | POA: Diagnosis not present

## 2021-07-26 DIAGNOSIS — K219 Gastro-esophageal reflux disease without esophagitis: Secondary | ICD-10-CM | POA: Diagnosis not present

## 2021-07-26 DIAGNOSIS — J449 Chronic obstructive pulmonary disease, unspecified: Secondary | ICD-10-CM | POA: Diagnosis not present

## 2021-07-26 DIAGNOSIS — J9600 Acute respiratory failure, unspecified whether with hypoxia or hypercapnia: Secondary | ICD-10-CM | POA: Diagnosis not present

## 2021-09-02 ENCOUNTER — Other Ambulatory Visit (HOSPITAL_COMMUNITY): Payer: Self-pay | Admitting: Psychiatry

## 2021-09-02 ENCOUNTER — Other Ambulatory Visit: Payer: Self-pay | Admitting: Obstetrics & Gynecology

## 2021-09-02 ENCOUNTER — Telehealth: Payer: Self-pay

## 2021-09-02 DIAGNOSIS — F411 Generalized anxiety disorder: Secondary | ICD-10-CM

## 2021-09-02 DIAGNOSIS — F3342 Major depressive disorder, recurrent, in full remission: Secondary | ICD-10-CM

## 2021-09-02 DIAGNOSIS — Z1231 Encounter for screening mammogram for malignant neoplasm of breast: Secondary | ICD-10-CM

## 2021-09-02 MED ORDER — AMITRIPTYLINE HCL 25 MG PO TABS: 25.0000 mg | ORAL_TABLET | Freq: Every day | ORAL | 1 refills | Status: DC

## 2021-09-02 NOTE — Telephone Encounter (Signed)
sent 

## 2021-09-02 NOTE — Telephone Encounter (Signed)
pt called she is out of her amitriptyline needs refills 90 day supply for insuance to cover.

## 2021-09-03 ENCOUNTER — Telehealth: Payer: Self-pay | Admitting: Student-PharmD
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
## 2021-09-05 DIAGNOSIS — E559 Vitamin D deficiency, unspecified: Secondary | ICD-10-CM | POA: Diagnosis not present

## 2021-09-05 DIAGNOSIS — E538 Deficiency of other specified B group vitamins: Secondary | ICD-10-CM | POA: Diagnosis not present

## 2021-09-05 DIAGNOSIS — E039 Hypothyroidism, unspecified: Secondary | ICD-10-CM | POA: Diagnosis not present

## 2021-09-06 LAB — VITAMIN D 25 HYDROXY (VIT D DEFICIENCY, FRACTURES): Vit D, 25-Hydroxy: 34.7 ng/mL (ref 30.0–100.0)

## 2021-09-06 LAB — B12 AND FOLATE PANEL
Folate: >20 ng/mL (ref >3.0)
Vitamin B-12: 338 pg/mL (ref 232–1245)

## 2021-09-06 LAB — TSH+FREE T4
Free T4: 1.03 ng/dL (ref 0.82–1.77)
TSH: 2.64 u[IU]/mL (ref 0.450–4.500)

## 2021-09-13 ENCOUNTER — Ambulatory Visit: Payer: Medicare HMO | Admitting: Nurse Practitioner

## 2021-09-24 ENCOUNTER — Encounter: Payer: Self-pay | Admitting: Nurse Practitioner

## 2021-09-24 ENCOUNTER — Ambulatory Visit (INDEPENDENT_AMBULATORY_CARE_PROVIDER_SITE_OTHER): Payer: Medicare HMO | Admitting: Nurse Practitioner

## 2021-09-24 ENCOUNTER — Other Ambulatory Visit: Payer: Self-pay

## 2021-09-24 VITALS — BP 128/72 | HR 74 | Temp 98.7°F | Resp 16 | Ht 65.0 in | Wt 189.0 lb

## 2021-09-24 DIAGNOSIS — K219 Gastro-esophageal reflux disease without esophagitis: Secondary | ICD-10-CM | POA: Diagnosis not present

## 2021-09-24 DIAGNOSIS — Z86010 Personal history of colonic polyps: Secondary | ICD-10-CM | POA: Diagnosis not present

## 2021-09-24 DIAGNOSIS — E538 Deficiency of other specified B group vitamins: Secondary | ICD-10-CM | POA: Diagnosis not present

## 2021-09-24 DIAGNOSIS — K900 Celiac disease: Secondary | ICD-10-CM

## 2021-09-24 DIAGNOSIS — E559 Vitamin D deficiency, unspecified: Secondary | ICD-10-CM

## 2021-09-24 DIAGNOSIS — K581 Irritable bowel syndrome with constipation: Secondary | ICD-10-CM | POA: Diagnosis not present

## 2021-09-24 MED ORDER — LINZESS 290 MCG PO CAPS
290.0000 ug | ORAL_CAPSULE | Freq: Every day | ORAL | 3 refills | Status: AC
Start: 2021-09-24 — End: ?

## 2021-09-24 MED ORDER — VITAMIN D (ERGOCALCIFEROL) 1.25 MG (50000 UNIT) PO CAPS: 50000.0000 [IU] | ORAL_CAPSULE | ORAL | 1 refills | Status: DC

## 2021-09-24 MED ORDER — FOLIC ACID 1 MG PO TABS: 1.0000 mg | ORAL_TABLET | Freq: Every day | ORAL | 1 refills | Status: DC

## 2021-09-24 MED ORDER — CYANOCOBALAMIN 1000 MCG/ML IJ SOLN
1000.0000 ug | Freq: Once | INTRAMUSCULAR | Status: AC
  Administered 2021-09-24: 1000 ug via INTRAMUSCULAR

## 2021-09-24 NOTE — Progress Notes (Signed)
[facility], [place] [address]  Internal MEDICINE  Office Visit Note  Patient Name: Susan [name]  [id number]  [id number]  Date of Service: 09/24/2021  Chief Complaint  Patient presents with   Follow-up   Gastroesophageal Reflux   Hyperlipidemia   COPD   Asthma   Depression   Anxiety   Results   Fatigue    HPI Skyelynn presents for follow up visit for lab results and anxiety. She had her labs drawn and her lhyroid levels are normal. Folate level is normal. Continues to take Going to [place] from December to April. She will need allergy shots and syringes. She is due for a B12 injection today. She takes a folic acid supplement and takes linzess for constipation.  Her B12 has improved a small amount. Her folic acid level has improved significantly and vitamin D is improved as well.  She has celiac disease and her symptoms are well controlled and she takes linzess to help with constipation.    Current Medication: Outpatient Encounter Medications as of 09/24/2021  Medication Sig   acetaminophen (TYLENOL) 500 MG tablet Take by mouth as needed.   albuterol (PROVENTIL HFA;VENTOLIN HFA) 108 (90 Base) MCG/ACT inhaler Inhale 2 puffs into the lungs every 6 (six) hours as needed for wheezing or shortness of breath.   albuterol (PROVENTIL) (2.5 MG/3ML) 0.083% nebulizer solution Take 3 mLs (2.5 mg total) by nebulization every 6 (six) hours as needed for wheezing or shortness of breath.   amitriptyline (ELAVIL) 25 MG tablet Take 1 tablet (25 mg total) by mouth at bedtime.   Ascorbic Acid (VITAMIN C) 100 MG tablet Take 100 mg by mouth daily.    BREO ELLIPTA 100-25 MCG/INH AEPB 1 PUFF BY MOUTH ONCE DAILY   carisoprodol (SOMA) 350 MG tablet Take 350 mg by mouth at bedtime. Patient states that she takes 1/2 tablet nightly    Dentifrices (BIOTENE DRY MOUTH DT) by Transmucosal route.   eletriptan (RELPAX) 40 MG tablet Take 40 mg by mouth as needed for migraine. may  repeat in 2 hours if necessary    EPINEPHRINE 0.3 mg/0.3 mL IJ SOAJ injection INJECT CONTENTS OF 1 PEN AS NEEDED FOR ALLERGIC REACTION   ezetimibe (ZETIA) 10 MG tablet Take 1 tablet (10 mg total) by mouth daily.   famotidine (PEPCID) 10 MG tablet Take 20 mg by mouth.    fenofibrate (TRICOR) 145 MG tablet Take 1 tablet (145 mg total) by mouth daily.   fluticasone (FLONASE) 50 MCG/ACT nasal spray Place 2 sprays into both nostrils daily.   gabapentin (NEURONTIN) 600 MG tablet Take 1/2 (one-half) tablet by mouth twice daily   hydroxychloroquine (PLAQUENIL) 200 MG tablet Take 1 tablet (200 mg total) by mouth 2 (two) times daily.   hyoscyamine (ANASPAZ) 0.125 MG TBDP disintergrating tablet Place 0.125 mg under the tongue every 4 (four) hours as needed for bladder spasms.    Lactobacillus (DIGESTIVE HEALTH PROBIOTIC PO) Take 1 tablet by mouth daily.    levothyroxine (SYNTHROID) 125 MCG tablet Take 1 tablet (125 mcg total) by mouth daily.   Magnesium Oxide (NATRUL MAGNESIUM PO) Take by mouth.    montelukast (SINGULAIR) 10 MG tablet TAKE ONE TABLET BY MOUTH ONCE DAILY FOR ASTHMA   Multiple Vitamins-Minerals (ZINC PO) Take by mouth daily.   mupirocin ointment (BACTROBAN) 2 % as needed.    pantoprazole (PROTONIX) 40 MG tablet Take 40 mg by mouth daily.    Polyethyl Glycol-Propyl Glycol 0.4-0.3 % SOLN Apply  to eye.    PRESCRIPTION MEDICATION 1 Syringe by Subdermal route once a week. Allergy shot    Probiotic Product ([place]) CAPS Take by mouth.    sodium chloride (OCEAN) 0.65 % SOLN nasal spray Place 1 spray into both nostrils as needed for congestion.    sodium chloride 0.9 % nebulizer solution    SUMAtriptan-naproxen (TREXIMET) 85-500 MG tablet daily as needed.    Tuberculin-Allergy Syringes 28G X 1/2" 1 ML MISC 1 each by Does not apply route every 14 (fourteen) days. For allergy injections   venlafaxine XR (EFFEXOR-XR) 150 MG 24 hr capsule Take 2 capsules (300 mg total) by mouth at  bedtime.   vitamin E 1000 UNIT capsule Take by mouth.    White Petrolatum-Mineral Oil ([place] PETROL-MINERAL OIL-LANOLIN) 0.1-0.1 % OINT    [DISCONTINUED] ALPRAZolam (XANAX) 0.5 MG tablet Take 0.5 mg by mouth 3 (three) times daily as needed.   [DISCONTINUED] estradiol (CLIMARA) 0.1 mg/24hr patch APPLY 1 PATCH TO SKIN ONCE WEEKLY   [DISCONTINUED] folic acid (FOLVITE) 1 MG tablet Take 1 tablet (1 mg total) by mouth daily.   [DISCONTINUED] HYDROcodone-acetaminophen (NORCO) 7.5-325 MG tablet Take 1-2 tablets by mouth every 6 (six) hours as needed for moderate pain.   [DISCONTINUED] LINZESS 290 MCG CAPS capsule Take 290 mcg by mouth daily.    [DISCONTINUED] Vaginal Lubricant (REPLENS) GEL Place vaginally.    [DISCONTINUED] Vitamin D, Ergocalciferol, (DRISDOL) 1.25 MG (50000 UNIT) CAPS capsule Take 1 capsule (50,000 Units total) by mouth every 7 (seven) days. Monday   folic acid (FOLVITE) 1 MG tablet Take 1 tablet (1 mg total) by mouth daily.   LINZESS 290 MCG CAPS capsule Take 1 capsule (290 mcg total) by mouth daily.   Vitamin D, Ergocalciferol, (DRISDOL) 1.25 MG (50000 UNIT) CAPS capsule Take 1 capsule (50,000 Units total) by mouth every 7 (seven) days. Monday   Facility-Administered Encounter Medications as of 09/24/2021  Medication   cyanocobalamin ((VITAMIN B-12)) injection 1,000 mcg   [COMPLETED] cyanocobalamin ((VITAMIN B-12)) injection 1,000 mcg    Surgical History: Past Surgical History:  Procedure Laterality Date   ABDOMINAL HYSTERECTOMY  2006   BLADDER REPAIR     BREAST CYST ASPIRATION Left yrs ago   [place] N/A 10/16/2021   Procedure: COLONOSCOPY;  Surgeon: Robert Bellow, MD;  Location: [facility] ENDOSCOPY;  Service: Endoscopy;  Laterality: N/A;   COLONOSCOPY WITH ESOPHAGOGASTRODUODENOSCOPY (EGD)     COLONOSCOPY WITH PROPOFOL N/A 08/03/2017   Procedure: COLONOSCOPY WITH PROPOFOL;  Surgeon: Lollie Sails, MD;  Location: [facility];  Service:  Endoscopy;  Laterality: N/A;   COLONOSCOPY WITH PROPOFOL N/A 10/18/2018   Procedure: COLONOSCOPY WITH PROPOFOL;  Surgeon: Lollie Sails, MD;  Location[facility] ENDOSCOPY;  Service: Endoscopy;  Laterality: N/A;   ESOPHAGOGASTRODUODENOSCOPY N/A 10/07/2013   Procedure: ESOPHAGOGASTRODUODENOSCOPY (EGD);  Surgeon: Danie Binder, MD;  Location: [facility] ENDO SUITE;  Service: Endoscopy;  Laterality: N/A;  patient received heparin at 530am given phenergan 11m IV 30 minutes before   ESOPHAGOGASTRODUODENOSCOPY     x4   EYE SURGERY     lacrimal gland    FLEXIBLE BRONCHOSCOPY N/A 06/04/2016   Procedure: FLEXIBLE BRONCHOSCOPY;  Surgeon: SAllyne Gee MD;  Location: [facility] ORS;  Service: Pulmonary;  Laterality: N/A;   INCONTINENCE SURGERY  2004   NASAL SINUS SURGERY     THYROID LOBECTOMY     TOTAL THYROIDECTOMY      Medical History: Past Medical History:  Diagnosis Date  Arthritis    Asthma    Asthmatic bronchitis    BRCA negative 05/2014   MyRisk neg   Celiac disease    Celiac sprue    Chronic anxiety    Complication of anesthesia    vomiting   COPD (chronic obstructive pulmonary disease) ([place])    Depression    Environmental allergies    Family history of breast cancer 05/2014   MyRisk neg; IBIS=29.5%   Fibromyalgia    neuropathy all over   Gastritis 10/08/2013   GERD (gastroesophageal reflux disease)    Hyperlipemia    Hyperlipidemia    Intolerant to statins   Hypothyroidism (acquired)    Increased risk of breast cancer 05/2014   IBIS=29.5%   Increased risk of breast cancer 05/2014   IBIS=29.5%   Migraine    Migraines    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     Family History: Family History  Problem Relation Age of Onset   Arthritis/Rheumatoid Mother        died in age 73s   Breast cancer Mother 1   Breast cancer Maternal Aunt         X 2. 50's   Breast cancer Maternal Aunt    Lung cancer Maternal Aunt    Breast cancer Maternal Aunt    Breast cancer Maternal Grandmother        40's   Pancreatitis Neg Hx    Colon cancer Neg Hx     Social History   Socioeconomic History   Marital status: Married    Spouse name: Not on file   Number of children: 0   Years of education: Not on file   Highest education level: Not on file  Occupational History   Not on file  Tobacco Use   Smoking status: Never   Smokeless tobacco: Never  Vaping Use   Vaping Use: Never used  Substance and Sexual Activity   Alcohol use: No   Drug use: No   Sexual activity: Yes    Birth control/protection: Surgical  Other Topics Concern   Not on file  Social History Narrative   Not on file   Social Determinants of Health   Financial Resource Strain: Low Risk    Difficulty of Paying Living Expenses: Not very hard  Food Insecurity: Not on file  Transportation Needs: Not on file  Physical Activity: Not on file  Stress: Not on file  Social Connections: Not on file  Intimate Partner Violence: Not on file      Review of Systems  Constitutional:  Negative for chills, fatigue and unexpected weight change.  HENT:  Negative for congestion, rhinorrhea, sneezing and sore throat.   Eyes:  Negative for redness.  Respiratory:  Negative for cough, chest tightness and shortness of breath.   Cardiovascular:  Negative for chest pain and palpitations.  Gastrointestinal:  Negative for abdominal pain, constipation, diarrhea, nausea and vomiting.  Genitourinary:  Negative for dysuria and frequency.  Musculoskeletal:  Negative for arthralgias, back pain, joint swelling and neck pain.  Skin:  Negative for rash.  Neurological: Negative.  Negative for tremors and numbness.  Hematological:  Negative for adenopathy. Does not bruise/bleed easily.  Psychiatric/Behavioral:  Negative for behavioral problems (Depression), sleep disturbance and suicidal  ideas. The patient is not nervous/anxious.    Vital Signs: BP 128/72  Pulse 74   Temp 98.7 F (37.1 C)   Resp 16   Ht 5' 5"  (1.651 m)   Wt 189 lb (85.7 kg)   SpO2 98%   BMI 31.45 kg/m    Physical Exam Vitals reviewed.  Constitutional:      General: She is not in acute distress.    Appearance: Normal appearance. She is normal weight. She is not ill-appearing.  HENT:     Head: Normocephalic and atraumatic.  Eyes:     Pupils: Pupils are equal, round, and reactive to light.  Cardiovascular:     Rate and Rhythm: Normal rate and regular rhythm.  Pulmonary:     Effort: Pulmonary effort is normal. No respiratory distress.  Neurological:     Mental Status: She is alert and oriented to person, place, and time.     Cranial Nerves: No cranial nerve deficit.     Coordination: Coordination normal.     Gait: Gait normal.  Psychiatric:        Mood and Affect: Mood normal.        Behavior: Behavior normal.       Assessment/Plan: 1. B12 deficiency Continue supplementation, level is improved slightly, B12 injection administered in office today.  - cyanocobalamin ((VITAMIN B-12)) injection 1,000 mcg  2. Vitamin D deficiency Vitamin D level is improving, continue weekly supplementation - Vitamin D, Ergocalciferol, (DRISDOL) 1.25 MG (50000 UNIT) CAPS capsule; Take 1 capsule (50,000 Units total) by mouth every 7 (seven) days. Monday  Dispense: 12 capsule; Refill: 1  3. Folate deficiency Significant improvement in folate level, continue supplementation.  - folic acid (FOLVITE) 1 MG tablet; Take 1 tablet (1 mg total) by mouth daily.  Dispense: 90 tablet; Refill: 1  4. CD (celiac disease) Refill ordered.  - LINZESS 290 MCG CAPS capsule; Take 1 capsule (290 mcg total) by mouth daily.  Dispense: 90 capsule; Refill: 3   General Counseling: Ernestyne verbalizes understanding of the findings of todays visit and agrees with plan of treatment. I have discussed any further diagnostic evaluation  that may be needed or ordered today. We also reviewed her medications today. she has been encouraged to call the office with any questions or concerns that should arise related to todays visit.    No orders of the defined types were placed in this encounter.   Meds ordered this encounter  Medications   cyanocobalamin ((VITAMIN B-12)) injection 1,000 mcg   Vitamin D, Ergocalciferol, (DRISDOL) 1.25 MG (50000 UNIT) CAPS capsule    Sig: Take 1 capsule (50,000 Units total) by mouth every 7 (seven) days. Monday    Dispense:  12 capsule    Refill:  1   folic acid (FOLVITE) 1 MG tablet    Sig: Take 1 tablet (1 mg total) by mouth daily.    Dispense:  90 tablet    Refill:  1   LINZESS 290 MCG CAPS capsule    Sig: Take 1 capsule (290 mcg total) by mouth daily.    Dispense:  90 capsule    Refill:  3    Will not need first fill for 3 months, just got first fill    Return in about 6 months (around 03/24/2022) for CPE, Henryetta Corriveau PCP.   Total time spent:30 Minutes Time spent includes review of chart, medications, test results, and follow up plan with the patient.   [place] Controlled Substance Database was reviewed by me.  This patient was seen by [name], FNP-C in collaboration with Dr. Latricia Heft  Humphrey Rolls as a part of collaborative care agreement.   Dhairya Corales R. Valetta Fuller, MSN, FNP-C Internal medicine

## 2021-09-25 ENCOUNTER — Telehealth: Payer: Self-pay

## 2021-09-25 DIAGNOSIS — J9600 Acute respiratory failure, unspecified whether with hypoxia or hypercapnia: Secondary | ICD-10-CM

## 2021-09-25 DIAGNOSIS — K219 Gastro-esophageal reflux disease without esophagitis: Secondary | ICD-10-CM

## 2021-09-25 DIAGNOSIS — J449 Chronic obstructive pulmonary disease, unspecified: Secondary | ICD-10-CM

## 2021-09-25 NOTE — Telephone Encounter (Signed)
Faxed patient assistance forms to [employer] at [phone]. Copy of paperwork has been placed on Desha's desk.

## 2021-09-30 DIAGNOSIS — Z79899 Other long term (current) drug therapy: Secondary | ICD-10-CM | POA: Diagnosis not present

## 2021-09-30 DIAGNOSIS — M3501 Sicca syndrome with keratoconjunctivitis: Secondary | ICD-10-CM | POA: Diagnosis not present

## 2021-10-01 DIAGNOSIS — G4733 Obstructive sleep apnea (adult) (pediatric): Secondary | ICD-10-CM | POA: Diagnosis not present

## 2021-10-02 ENCOUNTER — Ambulatory Visit: Payer: Medicare HMO | Admitting: Student-PharmD

## 2021-10-02 ENCOUNTER — Other Ambulatory Visit: Payer: Self-pay

## 2021-10-02 DIAGNOSIS — E039 Hypothyroidism, unspecified: Secondary | ICD-10-CM

## 2021-10-02 DIAGNOSIS — J449 Chronic obstructive pulmonary disease, unspecified: Secondary | ICD-10-CM

## 2021-10-02 NOTE — Progress Notes (Signed)
Follow Up Pharmacist Visit   Susan [name], Susan [name]  [id number] 59 years, Female  DOB: 1956/05/08  M: [phone]  Care Team: Clayborn Bigness, MD (PCP)  [name], PharmD  Chronic Conditions Patient's Chronic Conditions: Chronic Obstructive Pulmonary Disease (COPD), Gastroesophageal Reflux Disease (GERD), Depression, Anxiety, Chronic Pain, Hypothyroidism, Migraines, Celiac Disease, Fibromyalgia  Doctor and Hospital Visits Were there PCP Visits since last visit with the Pharmacist?: No Were there Specialist Visits since last visit with the Pharmacist?: No Was there a Hospital Visit in last 30 days?: No Were there other Hospital Visits since last visit with the Pharmacist?: No  Medication Information Have there been any medication changes from PCP or Specialist since last visit with the Pharmacist?: No Are there any Medication adherence gaps (beyond 5 days past due)?: No Medication adherence rates for the STAR rating drugs: N/A List Patient's current Care Gaps: No current Care Gaps identified  Disease Assessments  Subjective Information Current BP: 128/72 Current HR: 74 taken on: 09/24/21 Weight: 189 lbs BMI: 31.45 taken on: 09/24/2021 Why did the patient present?: CCM follow up visit  Chronic Obstructive Pulmonary Disease (COPD) CAT Score: 4 taken on: 10/02/2021 Assess this condition today?: Yes Gold grade: 1 (FEV1 > 80 %) Gold group: A (low sx, < 2 exacerbations / yr) Exacerbations since last visit with pharmacist?: No Has there been change in patients smoking/vaping habit since last visit with pharmacist?: No Home oxygen therapy: No Frequency of SABA/SAMA use: Several times per month We discussed: Inhaler technique Assessment:: Controlled Drug: Albuterol HFA 2 puffs every 6 hours as needed for shortness of breath Assessment: Appropriate, Effective, Safe, Accessible Drug: Albuterol 0.083% nebulizing solution Assessment: Appropriate, Effective, Safe, Accessible Drug: Breo  100-25 1 puff daily Assessment: Appropriate, Effective, Safe, Accessible Plan to: Continue current medication therapy; Reminded patient of difference between [facility] and rescue inhaler HC Follow up: 3 months Pharmacist Follow up: 04/02/22  Hypothyroidism Current TSH: 2.640 taken on: 09/05/21 Assess this condition today?: Yes Patient has experienced the following symptoms: Fatigue We discussed: Monitoring signs / symptoms of hyperthyroidism (weight loss, tachycardia, anxiety, increased sweating), Monitoring for signs / symptoms of hypothyroidism (weight gain, fatigue, hair loss, cold intolerance), Proper administration of levothyroxine (empty stomach, 30 min before breakfast and with no other meds / vitamins) Assessment:: Controlled Drug: Levothyroxine 133mg 1 tab daily Assessment: Appropriate, Effective, Safe, Accessible Additional Info: Patient's fatigue is from low B12, which she is currently treating Plan to: Continue current medication HC Follow up: 3 months Pharmacist Follow up: 04/02/22  Exercise, Diet and Non-Drug Coordination Needs Additional exercise counseling points. We discussed: targeting at least 151 minutes per week of moderate-intensity aerobic exercise. Discussed Non-Drug Care Coordination Needs: Yes Does Patient have Medication financial barriers?: Yes Goal: (pharmacist to fill in plan to resolve) i.e. change medications, samples, PAP.: Patient currently gets Breo and Linzess through PAP, will handle renewal Patient meets income/out of pocket spend criteria for this medications patient assistance program. Reviewed application process, patient will provide proof of income, out of pocket spend report, and will sign application.  Will collaborate with PCP, and will submit for patient.: Yes  [facility] Health-Related Social Needs Screening Tool -  SDOH  ([email or web address] What is your living  situation today? (ref #1): I have a steady place to live Think about the place you live. Do you have problems with any of the following? (ref #2): None of the above Within the past 12 months, you worried that your food would run out before you got money  to buy more (ref #3): Never true Within the past 12 months, the food you bought just didn't last and you didn't have money to get more (ref #4): Never true In the past 12 months, has lack of reliable transportation kept you from medical appointments, meetings, work or from getting things needed for daily living? (ref #5): No In the past 12 months, has the electric, gas, oil, or water company threatened to shut off services in your home? (ref #6): No How often does anyone, including family and friends, physically hurt you? (ref #7): Never (1) How often does anyone, including family and friends, insult or talk down to you? (ref #8): Never (1) How often does anyone, including friends and family, threaten you with harm? (ref #9): Never (1) How often does anyone, including family and friends, scream or curse at you? (ref #10): Never (1)   Susan [name]  DOB 08-Nov-1955  MRN: [id number]  COMPREHENSIVE CARE PLAN AND GOALS:     GERD   CURRENT REGIMEN AND DOSING:   Pantoprazole 16m once daily Famotidine 184m 2 tabs once daily  THE GOALS WE HAVE CHOSEN ARE:  Minimize reflux symptoms   BARRIERS TO ACHIEVING GOALS: None identified  PLAN TO WORK ON THESE GOALS:  Continue current medication as prescribed. Take measures to prevent acid reflux, such as avoiding spicy foods, avoiding caffeine, and raising the head of the bed.       ANXIETY/DEPRESSION  CURRENT MEDICATION AND DOSING:  Alprazolam 0.11m2m take three times daily as needed   Venlafaxine 150m50m - 2 caps daily at bedtime  THE GOALS WE HAVE CHOSEN ARE: Continue to see improvement in anxiety symptoms.  BARRIERS TO ACHIEVING GOALS: None identified   PLAN TO WORK ON THESE GOALS:  Continue current  medications as prescribed.     COPD  LAST SPIROMETRY SCORE/DATE:   07/12/21 WNL                                                       CURRENT CLASSIFICATION AS:    EOSINOPHIL COUNT:    EOS %:      4.9                                       EOS ABSOLUTE:  239  CURRENT MEDICATION AND DOSING:  Breo 100-25 one puff daily Albuterol HFA 2 puffs every 6 hours as needed for wheezing or shortness of breath Albuterol 0.083% solution 1 vial via nebulizer every 6 hours as needed for wheezing or shortness of breath  THE GOALS WE HAVE CHOSEN ARE: Prevent worsening of shortness of breath and hospitalizations.  BARRIERS TO ACHIEVING GOALS: None identified  PLAN TO WORK ON THESE GOALS: Continue current medications as prescribed.    ASTHMA  CURRENT MEDICATION AND DOSING: Montelukast 10mg86me daily Flonase 2 sprays in each nostril once daily Inhalers: see COPD  THE GOALS WE HAVE CHOSEN ARE: Prevent worsening of shortness or breath and hospitalizations.   BARRIERS TO ACHIEVING GOALS: None identified  PLAN TO WORK ON THESE GOALS: Continue current medication therapy.     HYPOTHYROIDISM  RECENT TSH/DATE:    09/05/2021 2.640 uIU/ml  CURRENT MEDICATION AND DOSING: Levothyroxine 1211mcg54me daily  THE GOALS  WE HAVE CHOSEN ARE:  Maintain TSH between 0.45 to 4.5uIU/ml  BARRIERS TO ACHIEVING GOALS: None identified   PLAN TO WORK ON THESE GOALS:  Continue current medications as prescribed.     HEALTHY HABITS (Diet and exercise)  CURRENT DIET/EXERCISE:   THE GOALS WE HAVE CHOSEN ARE:   Stay active, engage in at least 150 minutes per week of moderate-intensity exercise such as brisk walking (15- to 20-minute mile) or something similar. Increase flexibility exercises, break up prolonged periods of sitting Increase seafood, lean meats, whole grains, legumes, nuts, fruits (for dessert), and vegetables. Limit salt intake, sugars, carbs, and red meat, refined and processed foods.  BARRIERS TO ACHIEVING GOALS:   Fatigue from low B12 levels  PLAN TO WORK ON THESE GOALS:  Treating low B12   CHRONIC PAIN SYNDROME/Fibromylagia  CURRENT MEDICATION AND DOSING: Amitriptyline 70m - take once daily at bedtime  Carisoprodol 3531m1 tab at bedtime Gabapentin 60056m  tab at bedtime Hydrocodone-Acetaminophen 7.5-325mg 1 to 2 tabs every 6 hours as needed Acetaminophen 500m75m mouth as needed   THE GOALS WE HAVE CHOSEN ARE:  Maintain pain control  BARRIERS TO ACHIEVING GOALS: None identified  PLAN TO WORK ON THESE GOALS:  Continue current medications as prescribed.    ACTIVE MEDICATION LIST  MEDICATION DOSE DIRECTIONS CONDITION NOTES  Acetaminophen 500mg24mmouth as needed Pain   Albuterol HFA 108mcg79mf 2 puffs every 6 hours as needed COPD/Asthma   Albuterol Solution 0.083% 1 vial via neb every 6 hours as needed COPD/Asthma   Alprazolam 0.5mg Th50m times daily as needed Anxiety   Amitriptyline 25mg 1 61mat bedtime Fibromyalgia   Breo  100-25 1 puff daily COPD/Asthma   Vitamin C 100mg 1 d5m Supplement   Carisoprodol 350mg 1 da17mat bedtime Pain   Eletriptan 40mg As ne51m for migraines Migraines   Epipen 0.3mg As need43mfor allergic reaction Allergies   Estradiol patch 0.1mg/24hr One26mtch once weekly Estrogen    Ezetimibe 10mg Once dai50mholesterol   Famotidine  10mg 2 tabs on23maily GERD   Fenofibrate 145mg Once daily46mlesterol   Flonase 50mcg/spray 2 sp58m in each nostril once daily Allergies   Folic Acid 1mg Once daily Su53mement   Gabapentin 600mg  tab twice da74mPain   Hydrocodone-Acetaminophen  7.5-325mg 1 to 2 tabs every 6 hours as needed  Pain   Hydroxychloroquine 200mg 1 tab twice da54mSjogren's   Hyoscyamine ODT 0.125mg Under tongue ev17m4 hours as needed Bladder spasms   Lactobacillus Digestive Health  Once daily Stomach health   Levothyroxine 125mcg Once daily Thyr44m  Linzess  290 mcg Once daily Stomach   Magnesium Oxide  Once daily Supplement   Montelukast 10mg   Once daily Asth85m Mupirocin 2%  Apply once daily as needed Skin   Pantoprazole 40mg Once daily GERD   43methyl Glycol-Propyl 0.4-0.3% Apply to eye Eye   Ocean spray 0.65% Place 1 spray into both nostrils as needed for congestion Allergies   Sumatriptan-Naproxen 85-500mg Daily as needed Mig41mes   Replens Gel  Vaginally as needed Dryness   Venlafaxine XR 150MG 2 caps daily at bedtime Mood   Vitamin D  50,000 units Once weekly Supplement   Vitamin E 1000 UNITS Once daily Supplement   B-12 injection 1000mg  Supplement    MEDIC51mN REVIEW  MEDICATION REVIEW CONDUCTED:   Yes DATE:  10/02/2021 BEST POSSIBLE MEDICATION HISTORY SOURCE:   Medical Records  PHARMACY [facility] VIALS OR  PACKS: Vials   ALLERGIES/INTOLERANCES   NAME OF MEDICATION  REACTION   Aspirin   Unknown  Keflex Short of breath  Azithromycin, Clarithomycin Hives, nausea  Gluten Has Celiac Disease  Metoclopramide tremors  Celebrex hives      CURRENT HEALTHCARE PROVIDER TEAM   PROVIDER/TEAM MEMBER  ROLE  PHONE NUMBER  COMMENTS    Clayborn Bigness Primary Care Provider [phone]    [place], [place] Pharmacist [phone]               Vitamin B12 Deficiency Vitamin B12 deficiency means that your body does not have enough vitamin B12. The body needs this important vitamin: To make red blood cells. To make genes (DNA). To help the nerves work. If you do not have enough vitamin B12 in your body, you can have health problems, such as not having enough red blood cells in the blood (anemia). What are the causes? Not eating enough foods that contain vitamin B12. Not being able to take in (absorb) vitamin B12 from the food that you eat. Certain diseases. A condition in which the body does not make enough of a certain protein. This results in your body not taking in enough vitamin B12. Having a surgery in which part of the stomach or small intestine is taken out. Taking medicines that make it hard for the body to  take in vitamin B12. These include: Heartburn medicines. Some medicines that are used to treat diabetes. What increases the risk? Being an older adult. Eating a vegetarian or vegan diet that does not include any foods that come from animals. Not eating enough foods that contain vitamin B12 while you are pregnant. Taking certain medicines. Having alcoholism. What are the signs or symptoms? In some cases, there are no symptoms. If the condition leads to too few blood cells or nerve damage, symptoms can occur, such as: Feeling weak or tired. Not being hungry. Losing feeling (numbness) or tingling in your hands and feet. Redness and burning of the tongue. Feeling sad (depressed). Confusion or memory problems. Trouble walking. If anemia is very bad, symptoms can include: Being short of breath. Being dizzy. Having a very fast heartbeat. How is this treated? Changing the way you eat and drink, such as: Eating more foods that contain vitamin B12. Drinking little or no alcohol. Getting vitamin B12 shots. Taking vitamin B12 supplements by mouth (orally). Your doctor will tell you the dose that is best for you. Follow these instructions at home: Eating and drinking Examples of foods that are good sources of vitamin B12.  Eat foods that come from animals and have a lot of vitamin B12 in them. These include: Meats and poultry. This includes beef, pork, chicken, [place], and organ meats, such as liver. Seafood, such as clams, rainbow trout, salmon, tuna, and haddock. Eggs. Dairy foods such as milk, yogurt, and cheese. Eat breakfast cereals that have vitamin B12 added to them (are fortified). Check the label. The items listed above may not be a complete list of foods and beverages you can eat and drink. Contact a dietitian for more information. Alcohol use Do not drink alcohol if: Your doctor tells you not to drink. You are pregnant, may be pregnant, or are planning to become pregnant. If  you drink alcohol: Limit how much you have to: 0-1 drink a day for women. 0-2 drinks a day for men. Know how much alcohol is in your drink. In the [place] one drink equals one 12 oz bottle of beer (355  mL), one 5 oz glass of wine (148 mL), or one 1 oz glass of hard liquor (44 mL). General instructions Get any vitamin B12 shots if told by your doctor. Take supplements only as told by your doctor. Follow the directions. Keep all follow-up visits. Contact a doctor if: Your symptoms come back. Your symptoms get worse or do not get better with treatment. Get help right away if: You have trouble breathing. You have a very fast heartbeat. You have chest pain. You get dizzy. You faint. These symptoms may be an emergency. Get help right away. Call 911. Do not wait to see if the symptoms will go away. Do not drive yourself to the hospital. Summary Vitamin B12 deficiency means that your body is not getting enough of the vitamin. In some cases, there are no symptoms of this condition. Treatment may include making a change in the way you eat and drink, getting shots, or taking supplements. Eat foods that have vitamin B12 in them. This information is not intended to replace advice given to you by your health care provider. Make sure you discuss any questions you have with your health care provider.

## 2021-10-03 ENCOUNTER — Telehealth: Payer: Self-pay | Admitting: Student-PharmD
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
## 2021-10-03 NOTE — Progress Notes (Signed)
Chronic Care Management Pharmacy Assistant   Name: Susan [name]  MRN: [id number] DOB: 1956/08/09  Reason for Encounter: CCM Care Plan  Medications: Outpatient Encounter Medications as of 10/03/2021  Medication Sig   acetaminophen (TYLENOL) 500 MG tablet Take by mouth as needed.   albuterol (PROVENTIL HFA;VENTOLIN HFA) 108 (90 Base) MCG/ACT inhaler Inhale 2 puffs into the lungs every 6 (six) hours as needed for wheezing or shortness of breath.   albuterol (PROVENTIL) (2.5 MG/3ML) 0.083% nebulizer solution Take 3 mLs (2.5 mg total) by nebulization every 6 (six) hours as needed for wheezing or shortness of breath.   ALPRAZolam (XANAX) 0.5 MG tablet Take 0.5 mg by mouth 3 (three) times daily as needed.   amitriptyline (ELAVIL) 25 MG tablet Take 1 tablet (25 mg total) by mouth at bedtime.   Ascorbic Acid (VITAMIN C) 100 MG tablet Take 100 mg by mouth daily.    BREO ELLIPTA 100-25 MCG/INH AEPB 1 PUFF BY MOUTH ONCE DAILY   carisoprodol (SOMA) 350 MG tablet Take 350 mg by mouth at bedtime. Patient states that she takes 1/2 tablet nightly    Dentifrices (BIOTENE DRY MOUTH DT) by Transmucosal route.   eletriptan (RELPAX) 40 MG tablet Take 40 mg by mouth as needed for migraine. may repeat in 2 hours if necessary    EPINEPHRINE 0.3 mg/0.3 mL IJ SOAJ injection INJECT CONTENTS OF 1 PEN AS NEEDED FOR ALLERGIC REACTION   estradiol (CLIMARA) 0.1 mg/24hr patch APPLY 1 PATCH TO SKIN ONCE WEEKLY   ezetimibe (ZETIA) 10 MG tablet Take 1 tablet (10 mg total) by mouth daily.   famotidine (PEPCID) 10 MG tablet Take 20 mg by mouth.    fenofibrate (TRICOR) 145 MG tablet Take 1 tablet (145 mg total) by mouth daily.   fluticasone (FLONASE) 50 MCG/ACT nasal spray Place 2 sprays into both nostrils daily.   folic acid (FOLVITE) 1 MG tablet Take 1 tablet (1 mg total) by mouth daily.   gabapentin (NEURONTIN) 600 MG tablet Take 1/2 (one-half) tablet by mouth twice daily   HYDROcodone-acetaminophen (NORCO) 7.5-325 MG  tablet Take 1-2 tablets by mouth every 6 (six) hours as needed for moderate pain.   hydroxychloroquine (PLAQUENIL) 200 MG tablet Take 1 tablet (200 mg total) by mouth 2 (two) times daily.   hyoscyamine (ANASPAZ) 0.125 MG TBDP disintergrating tablet Place 0.125 mg under the tongue every 4 (four) hours as needed for bladder spasms.    Lactobacillus (DIGESTIVE HEALTH PROBIOTIC PO) Take 1 tablet by mouth daily.    levothyroxine (SYNTHROID) 125 MCG tablet Take 1 tablet (125 mcg total) by mouth daily.   LINZESS 290 MCG CAPS capsule Take 1 capsule (290 mcg total) by mouth daily.   Magnesium Oxide (NATRUL MAGNESIUM PO) Take by mouth.    montelukast (SINGULAIR) 10 MG tablet TAKE ONE TABLET BY MOUTH ONCE DAILY FOR ASTHMA   Multiple Vitamins-Minerals (ZINC PO) Take by mouth daily.   mupirocin ointment (BACTROBAN) 2 % as needed.    pantoprazole (PROTONIX) 40 MG tablet Take 40 mg by mouth daily.    Polyethyl Glycol-Propyl Glycol 0.4-0.3 % SOLN Apply to eye.    PRESCRIPTION MEDICATION 1 Syringe by Subdermal route once a week. Allergy shot    Probiotic Product ([place]) CAPS Take by mouth.    sodium chloride (OCEAN) 0.65 % SOLN nasal spray Place 1 spray into both nostrils as needed for congestion.    sodium chloride 0.9 % nebulizer solution    SUMAtriptan-naproxen (TREXIMET) 85-500 MG tablet daily  as needed.    Tuberculin-Allergy Syringes 28G X 1/2" 1 ML MISC 1 each by Does not apply route every 14 (fourteen) days. For allergy injections   Vaginal Lubricant (REPLENS) GEL Place vaginally.    venlafaxine XR (EFFEXOR-XR) 150 MG 24 hr capsule Take 2 capsules (300 mg total) by mouth at bedtime.   Vitamin D, Ergocalciferol, (DRISDOL) 1.25 MG (50000 UNIT) CAPS capsule Take 1 capsule (50,000 Units total) by mouth every 7 (seven) days. Monday   vitamin E 1000 UNIT capsule Take by mouth.    White Petrolatum-Mineral Oil ([place] PETROL-MINERAL OIL-LANOLIN) 0.1-0.1 % OINT    Facility-Administered Encounter  Medications as of 10/03/2021  Medication   cyanocobalamin ((VITAMIN B-12)) injection 1,000 mcg   Reviewed the patients initial visit reinsured it was completed per the pharmacist [name] request. Printed the CCM care plan. Mailed the patient CCM care plan to their most recent address on file.   Follow-Up:Pharmacist Review  Charlann Lange, [place] Pharmacist Assistant [phone]

## 2021-10-07 DIAGNOSIS — G43019 Migraine without aura, intractable, without status migrainosus: Secondary | ICD-10-CM | POA: Diagnosis not present

## 2021-10-07 DIAGNOSIS — R69 Illness, unspecified: Secondary | ICD-10-CM | POA: Diagnosis not present

## 2021-10-07 DIAGNOSIS — G250 Essential tremor: Secondary | ICD-10-CM | POA: Diagnosis not present

## 2021-10-07 DIAGNOSIS — M797 Fibromyalgia: Secondary | ICD-10-CM | POA: Diagnosis not present

## 2021-10-07 DIAGNOSIS — M545 Low back pain, unspecified: Secondary | ICD-10-CM | POA: Diagnosis not present

## 2021-10-07 DIAGNOSIS — G4700 Insomnia, unspecified: Secondary | ICD-10-CM | POA: Diagnosis not present

## 2021-10-08 ENCOUNTER — Telehealth: Payer: Self-pay | Admitting: Student-PharmD

## 2021-10-08 NOTE — Progress Notes (Signed)
Chronic Care Management Pharmacy Assistant   Name: Susan [name]  MRN: [id number] DOB: September 08, 1956  Reason for Encounter: PAP  Medications: Outpatient Encounter Medications as of 10/08/2021  Medication Sig   acetaminophen (TYLENOL) 500 MG tablet Take by mouth as needed.   albuterol (PROVENTIL HFA;VENTOLIN HFA) 108 (90 Base) MCG/ACT inhaler Inhale 2 puffs into the lungs every 6 (six) hours as needed for wheezing or shortness of breath.   albuterol (PROVENTIL) (2.5 MG/3ML) 0.083% nebulizer solution Take 3 mLs (2.5 mg total) by nebulization every 6 (six) hours as needed for wheezing or shortness of breath.   ALPRAZolam (XANAX) 0.5 MG tablet Take 0.5 mg by mouth 3 (three) times daily as needed.   amitriptyline (ELAVIL) 25 MG tablet Take 1 tablet (25 mg total) by mouth at bedtime.   Ascorbic Acid (VITAMIN C) 100 MG tablet Take 100 mg by mouth daily.    BREO ELLIPTA 100-25 MCG/INH AEPB 1 PUFF BY MOUTH ONCE DAILY   carisoprodol (SOMA) 350 MG tablet Take 350 mg by mouth at bedtime. Patient states that she takes 1/2 tablet nightly    Dentifrices (BIOTENE DRY MOUTH DT) by Transmucosal route.   eletriptan (RELPAX) 40 MG tablet Take 40 mg by mouth as needed for migraine. may repeat in 2 hours if necessary    EPINEPHRINE 0.3 mg/0.3 mL IJ SOAJ injection INJECT CONTENTS OF 1 PEN AS NEEDED FOR ALLERGIC REACTION   estradiol (CLIMARA) 0.1 mg/24hr patch APPLY 1 PATCH TO SKIN ONCE WEEKLY   ezetimibe (ZETIA) 10 MG tablet Take 1 tablet (10 mg total) by mouth daily.   famotidine (PEPCID) 10 MG tablet Take 20 mg by mouth.    fenofibrate (TRICOR) 145 MG tablet Take 1 tablet (145 mg total) by mouth daily.   fluticasone (FLONASE) 50 MCG/ACT nasal spray Place 2 sprays into both nostrils daily.   folic acid (FOLVITE) 1 MG tablet Take 1 tablet (1 mg total) by mouth daily.   gabapentin (NEURONTIN) 600 MG tablet Take 1/2 (one-half) tablet by mouth twice daily   HYDROcodone-acetaminophen (NORCO) 7.5-325 MG tablet Take  1-2 tablets by mouth every 6 (six) hours as needed for moderate pain.   hydroxychloroquine (PLAQUENIL) 200 MG tablet Take 1 tablet (200 mg total) by mouth 2 (two) times daily.   hyoscyamine (ANASPAZ) 0.125 MG TBDP disintergrating tablet Place 0.125 mg under the tongue every 4 (four) hours as needed for bladder spasms.    Lactobacillus (DIGESTIVE HEALTH PROBIOTIC PO) Take 1 tablet by mouth daily.    levothyroxine (SYNTHROID) 125 MCG tablet Take 1 tablet (125 mcg total) by mouth daily.   LINZESS 290 MCG CAPS capsule Take 1 capsule (290 mcg total) by mouth daily.   Magnesium Oxide (NATRUL MAGNESIUM PO) Take by mouth.    montelukast (SINGULAIR) 10 MG tablet TAKE ONE TABLET BY MOUTH ONCE DAILY FOR ASTHMA   Multiple Vitamins-Minerals (ZINC PO) Take by mouth daily.   mupirocin ointment (BACTROBAN) 2 % as needed.    pantoprazole (PROTONIX) 40 MG tablet Take 40 mg by mouth daily.    Polyethyl Glycol-Propyl Glycol 0.4-0.3 % SOLN Apply to eye.    PRESCRIPTION MEDICATION 1 Syringe by Subdermal route once a week. Allergy shot    Probiotic Product ([place]) CAPS Take by mouth.    sodium chloride (OCEAN) 0.65 % SOLN nasal spray Place 1 spray into both nostrils as needed for congestion.    sodium chloride 0.9 % nebulizer solution    SUMAtriptan-naproxen (TREXIMET) 85-500 MG tablet daily as needed.  Tuberculin-Allergy Syringes 28G X 1/2" 1 ML MISC 1 each by Does not apply route every 14 (fourteen) days. For allergy injections   Vaginal Lubricant (REPLENS) GEL Place vaginally.    venlafaxine XR (EFFEXOR-XR) 150 MG 24 hr capsule Take 2 capsules (300 mg total) by mouth at bedtime.   Vitamin D, Ergocalciferol, (DRISDOL) 1.25 MG (50000 UNIT) CAPS capsule Take 1 capsule (50,000 Units total) by mouth every 7 (seven) days. Monday   vitamin E 1000 UNIT capsule Take by mouth.    White Petrolatum-Mineral Oil ([place] PETROL-MINERAL OIL-LANOLIN) 0.1-0.1 % OINT    Facility-Administered Encounter Medications as  of 10/08/2021  Medication   cyanocobalamin ((VITAMIN B-12)) injection 1,000 mcg   New patient assistance application form filled out to [employer] and [employer] for [employer], linzess . Waiting for patient and provider to complete and sign documentation. Called patient to inquire if they wanted the application mailed to them or if they wanted to come into the office. Patient is required to sign application and to bring/have proof of income. She stated she would be willing to come into office to bring proof of income and sign application once her paperwork came in the mail she would like it mailed to their residence address [address] [place] [address].   Follow-Up:Pharmacist Review  Charlann Lange, [place] Pharmacist Assistant [phone]

## 2021-10-09 ENCOUNTER — Ambulatory Visit
Admission: RE | Admit: 2021-10-09 | Discharge: 2021-10-09 | Disposition: A | Discharge status: Home / Self Care | Payer: Medicare HMO | Source: Ambulatory Visit | Attending: Obstetrics & Gynecology | Admitting: Obstetrics & Gynecology

## 2021-10-09 ENCOUNTER — Telehealth: Payer: Self-pay

## 2021-10-09 ENCOUNTER — Encounter: Payer: Self-pay | Admitting: Psychiatry

## 2021-10-09 ENCOUNTER — Other Ambulatory Visit: Payer: Self-pay

## 2021-10-09 ENCOUNTER — Telehealth (INDEPENDENT_AMBULATORY_CARE_PROVIDER_SITE_OTHER): Payer: Medicare HMO | Admitting: Psychiatry

## 2021-10-09 DIAGNOSIS — F3342 Major depressive disorder, recurrent, in full remission: Secondary | ICD-10-CM

## 2021-10-09 DIAGNOSIS — R69 Illness, unspecified: Secondary | ICD-10-CM | POA: Diagnosis not present

## 2021-10-09 DIAGNOSIS — F411 Generalized anxiety disorder: Secondary | ICD-10-CM

## 2021-10-09 DIAGNOSIS — Z1231 Encounter for screening mammogram for malignant neoplasm of breast: Secondary | ICD-10-CM | POA: Diagnosis not present

## 2021-10-09 NOTE — Telephone Encounter (Signed)
Lvm to schedule Allergy injection-Toni

## 2021-10-09 NOTE — Patient Instructions (Addendum)
Continue venlafaxine 300 mg daily  Continue amitriptyline 25 mg at night  Next appointment: 5/8 at 9 AM, in person  The next visit will be in person visit. Please arrive 15 mins before the scheduled time.   [facility]  Address: [address], [place], [place] [address]

## 2021-10-09 NOTE — Telephone Encounter (Signed)
Patient has been advised that each allergy injection will cost her $25.00 per injection. Pt would like to proceed. Susan [name] will reach out to patient. This also includes allergy serum.  25.00 allergy copay No auth No ded 4500 oop max 557.04 met Ref# [id number]

## 2021-10-09 NOTE — Telephone Encounter (Signed)
Refill on Allergy serum mixed.  Placed chart on Leslie's desk and sent telephone message to leslie and [place] for billing.  Sent message to appt desk for pt to schedule appt

## 2021-10-10 ENCOUNTER — Ambulatory Visit (INDEPENDENT_AMBULATORY_CARE_PROVIDER_SITE_OTHER): Payer: Medicare HMO

## 2021-10-10 DIAGNOSIS — J309 Allergic rhinitis, unspecified: Secondary | ICD-10-CM

## 2021-10-10 DIAGNOSIS — J301 Allergic rhinitis due to pollen: Secondary | ICD-10-CM | POA: Diagnosis not present

## 2021-10-10 NOTE — Progress Notes (Signed)
Aeroallergen Immunotherapy    Patient Details  Name: Susan [name] MRN: [id number] Date of Birth: [birth year]  Order 1 of 1  Vial Label: DM  0.20 mL of each antigen: Dust Mite Mix   0.2   mL Extract Subtotal 4.8   mL Normal Saline Diluent 5.0  mL Maintenance Total   Final Concentration above is stated in weight/volume (wt/vol).  Allergen units (AU/ml) biological units (BAU/ml).  The total volume is 5 ml.      Special Instructions: none

## 2021-10-15 ENCOUNTER — Encounter: Payer: Self-pay | Admitting: General Surgery

## 2021-10-16 ENCOUNTER — Ambulatory Visit: Payer: Medicare HMO | Admitting: Certified Registered Nurse Anesthetist

## 2021-10-16 ENCOUNTER — Other Ambulatory Visit: Payer: Self-pay

## 2021-10-16 ENCOUNTER — Telehealth: Payer: Self-pay

## 2021-10-16 ENCOUNTER — Encounter
Admission: RE | Disposition: A | Discharge status: Home / Self Care | Payer: Self-pay | Source: Ambulatory Visit | Attending: General Surgery

## 2021-10-16 ENCOUNTER — Encounter: Payer: Self-pay | Admitting: General Surgery

## 2021-10-16 ENCOUNTER — Ambulatory Visit
Admission: RE | Admit: 2021-10-16 | Discharge: 2021-10-16 | Disposition: A | Discharge status: Home / Self Care | Payer: Medicare HMO | Source: Ambulatory Visit | Attending: General Surgery | Admitting: General Surgery

## 2021-10-16 ENCOUNTER — Other Ambulatory Visit: Payer: Self-pay | Admitting: General Surgery

## 2021-10-16 DIAGNOSIS — D120 Benign neoplasm of cecum: Secondary | ICD-10-CM | POA: Diagnosis not present

## 2021-10-16 DIAGNOSIS — K6389 Other specified diseases of intestine: Secondary | ICD-10-CM

## 2021-10-16 DIAGNOSIS — K558 Other vascular disorders of intestine: Secondary | ICD-10-CM | POA: Diagnosis not present

## 2021-10-16 DIAGNOSIS — D175 Benign lipomatous neoplasm of intra-abdominal organs: Secondary | ICD-10-CM | POA: Diagnosis not present

## 2021-10-16 DIAGNOSIS — K219 Gastro-esophageal reflux disease without esophagitis: Secondary | ICD-10-CM | POA: Insufficient documentation

## 2021-10-16 DIAGNOSIS — Z1211 Encounter for screening for malignant neoplasm of colon: Secondary | ICD-10-CM | POA: Diagnosis not present

## 2021-10-16 DIAGNOSIS — Z86010 Personal history of colonic polyps: Secondary | ICD-10-CM | POA: Diagnosis not present

## 2021-10-16 DIAGNOSIS — E039 Hypothyroidism, unspecified: Secondary | ICD-10-CM | POA: Diagnosis not present

## 2021-10-16 DIAGNOSIS — K635 Polyp of colon: Secondary | ICD-10-CM | POA: Diagnosis not present

## 2021-10-16 HISTORY — PX: COLONOSCOPY: SHX5424

## 2021-10-16 SURGERY — COLONOSCOPY
Anesthesia: General

## 2021-10-16 MED ORDER — SODIUM CHLORIDE 0.9 % IV SOLN: INTRAVENOUS | Status: DC

## 2021-10-16 MED ORDER — PROPOFOL 500 MG/50ML IV EMUL
INTRAVENOUS | Status: DC | PRN
Start: 2021-10-16 — End: 2021-10-16
  Administered 2021-10-16: 180 ug/kg/min via INTRAVENOUS

## 2021-10-16 MED ORDER — PROPOFOL 10 MG/ML IV BOLUS
INTRAVENOUS | Status: DC | PRN
  Administered 2021-10-16: 80 mg via INTRAVENOUS

## 2021-10-16 NOTE — Op Note (Signed)
[facility] Gastroenterology Patient Name: Susan [name] Procedure Date: 10/16/2021 8:57 AM MRN: [id number] Account #: [id number] Date of Birth: July 31, 1956 Admit Type: Outpatient Age: 65 Room: [facility] ENDO ROOM 1 Gender: Female Note Status: Finalized Instrument Name: Peds Colonoscope [id number] Procedure:             Colonoscopy Indications:           High risk colon cancer surveillance: Personal history                         of colonic polyps Providers:             Robert Bellow, MD Referring MD:          Placido Sou (Referring MD) Medicines:             Propofol per Anesthesia Complications:         No immediate complications. Procedure:             Pre-Anesthesia Assessment:                        - Prior to the procedure, a History and Physical was                         performed, and patient medications, allergies and                         sensitivities were reviewed. The patient's tolerance                         of previous anesthesia was reviewed.                        - The risks and benefits of the procedure and the                         sedation options and risks were discussed with the                         patient. All questions were answered and informed                         consent was obtained.                        After obtaining informed consent, the colonoscope was                         passed under direct vision. Throughout the procedure,                         the patient's blood pressure, pulse, and oxygen                         saturations were monitored continuously. The                         Colonoscope was introduced through the anus and                         advanced to  the the cecum, identified by appendiceal                         orifice and ileocecal valve. The colonoscopy was                         performed without difficulty. The patient tolerated                         the procedure well. The quality  of the bowel                         preparation was excellent. Findings:      A 10 mm polyp was found in the ileocecal valve. The polyp was sessile.       The polyp was removed with a hot snare. Resection was complete, but the       polyp tissue was not retrieved.      There was a large lipoma, 40 mm in diameter, in the cecum. Biopsies were       taken with a cold forceps for histology.      The retroflexed view of the distal rectum and anal verge was normal and       showed no anal or rectal abnormalities. Impression:            - One 10 mm polyp at the ileocecal valve, removed with                         a hot snare. Complete resection. Polyp tissue not                         retrieved.                        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 Biopsied.                        - The distal rectum and anal verge are normal on                         retroflexion view. Recommendation:        - Telephone endoscopist for pathology results in 1                         week. Procedure Code(s):     --- Professional ---                        [phone], Colonoscopy, flexible; with removal of                         tumor(s), polyp(s), or other lesion(s) by snare                         technique                        64403, 62, Colonoscopy, flexible; with biopsy, single                         or multiple Diagnosis  Code(s):     --- Professional ---                        Z86.010, Personal history of colonic polyps                        K63.5, Polyp of colon                        D17.5, Benign lipomatous neoplasm of intra-abdominal                         organs CPT copyright 2019 American Medical Association. All rights reserved. The codes documented in this report are preliminary and upon coder review may  be revised to meet current compliance requirements. Robert Bellow, MD 10/16/2021 9:42:38 AM This report has been signed electronically. Number of Addenda: 0 Note Initiated On:  10/16/2021 8:57 AM Scope Withdrawal Time: 0 hours 19 minutes 41 seconds  Total Procedure Duration: 0 hours 32 minutes 29 seconds  Estimated Blood Loss:  Estimated blood loss: none. Estimated blood loss was                         minimal.      [facility]

## 2021-10-16 NOTE — Telephone Encounter (Signed)
Faxed pt asst papers back with medication quantity 10/15/21 @157pm 

## 2021-10-16 NOTE — Transfer of Care (Signed)
Immediate Anesthesia Transfer of Care Note  Patient: Susan [name]  Procedure(s) Performed: COLONOSCOPY  Patient Location: PACU  Anesthesia Type:General  Level of Consciousness: drowsy  Airway & Oxygen Therapy: Patient Spontanous Breathing  Post-op Assessment: Report given to RN and Post -op Vital signs reviewed and stable  Post vital signs: Reviewed and stable  Last Vitals:  Vitals Value Taken Time  BP 100/69 10/16/21 0942  Temp 36.6 C 10/16/21 0940  Pulse 67 10/16/21 0942  Resp 19 10/16/21 0942  SpO2 96 % 10/16/21 0942  Vitals shown include unvalidated device data.  Last Pain:  Vitals:   10/16/21 0940  TempSrc: Temporal  PainSc:          Complications: No notable events documented.

## 2021-10-16 NOTE — H&P (Signed)
Susan [name] [id number] April 18, 1956     HPI:  65 y/o woman with multiple polyps in 2018, fewer in 2019. For follow up exam. Tolerated prep well.   Facility-Administered Medications Prior to Admission  Medication Dose Route Frequency Provider Last Rate Last Admin   cyanocobalamin ((VITAMIN B-12)) injection 1,000 mcg  1,000 mcg Intramuscular Once [name], NP       Medications Prior to Admission  Medication Sig Dispense Refill Last Dose   acetaminophen (TYLENOL) 500 MG tablet Take by mouth as needed.   Past Week   albuterol (PROVENTIL HFA;VENTOLIN HFA) 108 (90 Base) MCG/ACT inhaler Inhale 2 puffs into the lungs every 6 (six) hours as needed for wheezing or shortness of breath. 1 Inhaler 2 Past Week   albuterol (PROVENTIL) (2.5 MG/3ML) 0.083% nebulizer solution Take 3 mLs (2.5 mg total) by nebulization every 6 (six) hours as needed for wheezing or shortness of breath. 75 mL 6 Past Week   ALPRAZolam (XANAX) 0.5 MG tablet Take 0.5 mg by mouth 3 (three) times daily as needed.   10/15/2021 at 2200   amitriptyline (ELAVIL) 25 MG tablet Take 1 tablet (25 mg total) by mouth at bedtime. 90 tablet 1 10/15/2021 at 2200   Ascorbic Acid (VITAMIN C) 100 MG tablet Take 100 mg by mouth daily.    Past Week   azelastine (ASTELIN) 0.1 % nasal spray Place 2 sprays into both nostrils 2 (two) times daily. Use in each nostril as directed   10/15/2021 at 0800   BREO ELLIPTA 100-25 MCG/INH AEPB 1 PUFF BY MOUTH ONCE DAILY 60 each 2 10/15/2021 at 0800   carisoprodol (SOMA) 350 MG tablet Take 350 mg by mouth at bedtime. Patient states that she takes 1/2 tablet nightly    10/15/2021 at 0800   Dentifrices ([place] DT) by Transmucosal route.   Past Week   eletriptan (RELPAX) 40 MG tablet Take 40 mg by mouth as needed for migraine. may repeat in 2 hours if necessary    Past Month at 0800   EPINEPHRINE 0.3 mg/0.3 mL IJ SOAJ injection INJECT CONTENTS OF 1 PEN AS NEEDED FOR ALLERGIC REACTION 2 each 1 Past  Month   estradiol (CLIMARA) 0.1 mg/24hr patch APPLY 1 PATCH TO SKIN ONCE WEEKLY 12 patch 4 10/15/2021 at 0800   ezetimibe (ZETIA) 10 MG tablet Take 1 tablet (10 mg total) by mouth daily. 90 tablet 1 10/15/2021 at 0800   famotidine (PEPCID) 10 MG tablet Take 20 mg by mouth.    10/15/2021 at 0800   fenofibrate (TRICOR) 145 MG tablet Take 1 tablet (145 mg total) by mouth daily. 90 tablet 1 10/15/2021 at 0800   fluticasone (FLONASE) 50 MCG/ACT nasal spray Place 2 sprays into both nostrils daily. 16 g 3 62/69/4854 at 6270   folic acid (FOLVITE) 1 MG tablet Take 1 tablet (1 mg total) by mouth daily. 90 tablet 1 10/15/2021 at 0800   gabapentin (NEURONTIN) 600 MG tablet Take 1/2 (one-half) tablet by mouth twice daily 45 tablet 3 10/15/2021 at 2200   HYDROcodone-acetaminophen (NORCO) 7.5-325 MG tablet Take 1-2 tablets by mouth every 6 (six) hours as needed for moderate pain. 30 tablet 0 Past Week   hydroxychloroquine (PLAQUENIL) 200 MG tablet Take 1 tablet (200 mg total) by mouth 2 (two) times daily. 180 tablet 0 10/15/2021 at 0800   hyoscyamine (ANASPAZ) 0.125 MG TBDP disintergrating tablet Place 0.125 mg under the tongue every 4 (four) hours as needed for bladder spasms.    10/15/2021  at 0800   Lactobacillus (DIGESTIVE HEALTH PROBIOTIC PO) Take 1 tablet by mouth daily.    Past Week   levothyroxine (SYNTHROID) 125 MCG tablet Take 1 tablet (125 mcg total) by mouth daily. 90 tablet 3 10/15/2021 at 0800   LINZESS 290 MCG CAPS capsule Take 1 capsule (290 mcg total) by mouth daily. 90 capsule 3 10/15/2021 at 0800   Magnesium Oxide (NATRUL MAGNESIUM PO) Take by mouth.    Past Week   montelukast (SINGULAIR) 10 MG tablet TAKE ONE TABLET BY MOUTH ONCE DAILY FOR ASTHMA 90 tablet 3 10/15/2021 at 0800   Multiple Vitamins-Minerals (ZINC PO) Take by mouth daily.   Past Week   mupirocin ointment (BACTROBAN) 2 % as needed.   0 Past Week   pantoprazole (PROTONIX) 40 MG tablet Take 40 mg by mouth daily.    10/15/2021 at  0800   Polyethyl Glycol-Propyl Glycol 0.4-0.3 % SOLN Apply to eye.    Past Week   PRESCRIPTION MEDICATION 1 Syringe by Subdermal route once a week. Allergy shot    10/15/2021   Probiotic Product ([place]) CAPS Take by mouth.    Past Week   sodium chloride (OCEAN) 0.65 % SOLN nasal spray Place 1 spray into both nostrils as needed for congestion.    Past Week   sodium chloride 0.9 % nebulizer solution    Past Week   SUMAtriptan-naproxen (TREXIMET) 85-500 MG tablet daily as needed.    10/15/2021 at 0800   Tuberculin-Allergy Syringes 28G X 1/2" 1 ML MISC 1 each by Does not apply route every 14 (fourteen) days. For allergy injections 12 each 3 Past Week   Vaginal Lubricant (REPLENS) GEL Place vaginally.    Past Week   venlafaxine XR (EFFEXOR-XR) 150 MG 24 hr capsule Take 2 capsules (300 mg total) by mouth at bedtime. 180 capsule 1 10/15/2021 at 0800   Vitamin D, Ergocalciferol, (DRISDOL) 1.25 MG (50000 UNIT) CAPS capsule Take 1 capsule (50,000 Units total) by mouth every 7 (seven) days. Monday 12 capsule 1 Past Week   vitamin E 1000 UNIT capsule Take by mouth.    Past Week   White Emergency planning/management officer ([place] PETROL-MINERAL OIL-LANOLIN) 0.1-0.1 % OINT    Past Week   Allergies  Allergen Reactions   Aspirin    Keflex [Cephalexin] Shortness Of Breath   Azithromycin Hives and Nausea Only   Barley Grass    Clarithromycin Other (See Comments)   Metoclopramide Other (See Comments)    TREMORS Tremors Tremors    Oat Other (See Comments)   Oatmeal Other (See Comments)   Other     Other reaction(s): Other (See Comments) KEOPLEX.   Rye Grass Flower Pollen Extract [[place] Pollens]    Vioxx [Rofecoxib] Diarrhea   Wheat Bran Other (See Comments)    Celiac disease   Wheat Extract    Celebrex [Celecoxib] Hives   Metoclopramide Hcl Anxiety    BODY TREMORS   Past Medical History:  Diagnosis Date   Arthritis    Asthma    Asthmatic bronchitis    BRCA negative 05/2014   MyRisk neg    Celiac disease    Celiac sprue    Chronic anxiety    Complication of anesthesia    vomiting   COPD (chronic obstructive pulmonary disease) (HCC)    Depression    Environmental allergies    Family history of breast cancer 05/2014   MyRisk neg; IBIS=29.5%   Fibromyalgia    neuropathy all over   Gastritis  10/08/2013   GERD (gastroesophageal reflux disease)    Hyperlipemia    Hyperlipidemia    Intolerant to statins   Hypothyroidism (acquired)    Increased risk of breast cancer 05/2014   IBIS=29.5%   Increased risk of breast cancer 05/2014   IBIS=29.5%   Migraine    Migraines    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    Past Surgical History:  Procedure Laterality Date   ABDOMINAL HYSTERECTOMY  2006   BLADDER REPAIR     BREAST CYST ASPIRATION Left yrs ago   [place] ESOPHAGOGASTRODUODENOSCOPY (EGD)     COLONOSCOPY WITH PROPOFOL N/A 08/03/2017   Procedure: COLONOSCOPY WITH PROPOFOL;  Surgeon: Lollie Sails, MD;  Location: [facility] ENDOSCOPY;  Service: Endoscopy;  Laterality: N/A;   COLONOSCOPY WITH PROPOFOL N/A 10/18/2018   Procedure: COLONOSCOPY WITH PROPOFOL;  Surgeon: Lollie Sails, MD;  Location[facility] ENDOSCOPY;  Service: Endoscopy;  Laterality: N/A;   ESOPHAGOGASTRODUODENOSCOPY N/A 10/07/2013   Procedure: ESOPHAGOGASTRODUODENOSCOPY (EGD);  Surgeon: Danie Binder, MD;  Location: [facility] ENDO SUITE;  Service: Endoscopy;  Laterality: N/A;  patient received heparin at 530am given phenergan 100m IV 30 minutes before   ESOPHAGOGASTRODUODENOSCOPY     x4   EYE SURGERY     lacrimal gland    FLEXIBLE BRONCHOSCOPY N/A 06/04/2016   Procedure: FLEXIBLE BRONCHOSCOPY;  Surgeon: SAllyne Gee MD;  Location: [facility] ORS;  Service: Pulmonary;  Laterality: N/A;   INCONTINENCE SURGERY  2004   NASAL SINUS SURGERY      THYROID LOBECTOMY     TOTAL THYROIDECTOMY     Social History   Socioeconomic History   Marital status: Married    Spouse name: Not on file   Number of children: 0   Years of education: Not on file   Highest education level: Not on file  Occupational History   Not on file  Tobacco Use   Smoking status: Never   Smokeless tobacco: Never  Vaping Use   Vaping Use: Never used  Substance and Sexual Activity   Alcohol use: No   Drug use: No   Sexual activity: Yes    Birth control/protection: Surgical  Other Topics Concern   Not on file  Social History Narrative   Not on file   Social Determinants of Health   Financial Resource Strain: Low Risk    Difficulty of Paying Living Expenses: Not very hard  Food Insecurity: Not on file  Transportation Needs: Not on file  Physical Activity: Not on file  Stress: Not on file  Social Connections: Not on file  Intimate Partner Violence: Not on file   Social History   Social History Narrative   Not on file     ROS: Negative.     PE: HEENT: Negative. Lungs: Clear. Cardio: RR.  Path 10/18/2018: DIAGNOSIS:  A.  COLON POLYP, PROXIMAL ASCENDING; COLD BIOPSY:  - TUBULAR ADENOMA, MULTIPLE FRAGMENTS.  - NEGATIVE FOR HIGH-GRADE DYSPLASIA AND MALIGNANCY.   B.  RECTUM POLYP; COLD BIOPSY:  - HYPERPLASTIC POLYP.  - NEGATIVE FOR DYSPLASIA AND MALIGNANCY  Path 10.08.2018:  DIAGNOSIS:  A.  COLON POLYP, TRANSVERSE; COLD BIOPSY:  - TUBULAR ADENOMA.  - NEGATIVE FOR HIGH-GRADE DYSPLASIA AND MALIGNANCY.   B.  COLON POLYP, ASCENDING; COLD BIOPSY:  - TUBULAR ADENOMA.  - NEGATIVE FOR  HIGH-GRADE DYSPLASIA AND MALIGNANCY.   C.  COLON POLYP, ASCENDING; COLD SNARE:  - SCANT FRAGMENTS OF FOOD DEBRIS.  - NEGATIVE FOR COLONIC MUCOSA.   D.  COLON POLYP, DISTAL ASCENDING; COLD SNARE:  - TUBULAR ADENOMA.  - NEGATIVE FOR HIGH-GRADE DYSPLASIA AND MALIGNANCY.   E.  COLON POLYP, TRANSVERSE; COLD BIOPSY:  - TUBULAR ADENOMA, MULTIPLE FRAGMENTS.   - NEGATIVE FOR HIGH-GRADE DYSPLASIA AND MALIGNANCY.   F.  RECTUM POLYP; COLD BIOPSY:  - HYPERPLASTIC POLYP.    - NEGATIVE FOR DYSPLASIA AND MALIGNANCY.   From 08/2018 GI note:   Endoscopic History:  Colonoscopy 10/18/18 and demonstrated a small adenoma. A 3y repeat has been recommended Colonoscopy 10/ 2018- Dr Gustavo Lah- several polyps in the ascending and transverse colon, one from rectum. Rectal polyp was hyperplastic, others were adenomatous- one from the transverse colon was thought to have high grade dysplasia however this was reviewed and later decided it was just low grade dysplasia.  Colonoscopy 2014 done for family history of colon polyps in her father- no polyps EGD 2012 with reflux esophagitis and gastritis. There was no H pylori, Barretts, dysplasia/malignancy/celiac sprue found at that time.   Assessment/Plan:  Proceed with planned endoscopy.   [name] 10/16/2021

## 2021-10-16 NOTE — Anesthesia Postprocedure Evaluation (Signed)
Anesthesia Post Note  Patient: Susan [name]  Procedure(s) Performed: COLONOSCOPY  Patient location during evaluation: Phase II Anesthesia Type: General Level of consciousness: awake and alert, awake and oriented Pain management: pain level controlled Vital Signs Assessment: post-procedure vital signs reviewed and stable Respiratory status: spontaneous breathing, nonlabored ventilation and respiratory function stable Cardiovascular status: blood pressure returned to baseline and stable Postop Assessment: no apparent nausea or vomiting Anesthetic complications: no   No notable events documented.   Last Vitals:  Vitals:   10/16/21 1000 10/16/21 1010  BP: 109/76 (!) 129/54  Pulse: 67 72  Resp: 18 19  Temp:    SpO2: 100% 99%    Last Pain:  Vitals:   10/16/21 0940  TempSrc: Temporal  PainSc:                  Phill Mutter

## 2021-10-16 NOTE — Anesthesia Preprocedure Evaluation (Signed)
Anesthesia Evaluation  Patient identified by MRN, date of birth, ID band Patient awake    Reviewed: Allergy & Precautions, NPO status , Patient's Chart, lab work & pertinent test results  History of Anesthesia Complications (+) PONV and history of anesthetic complications  Airway Mallampati: II  TM Distance: >3 FB Neck ROM: Full    Dental  (+) Partial Upper   Pulmonary shortness of breath and with exertion, asthma , sleep apnea , COPD,  COPD inhaler,    Pulmonary exam normal        Cardiovascular negative cardio ROS Normal cardiovascular exam     Neuro/Psych  Headaches, PSYCHIATRIC DISORDERS Anxiety Depression  Neuromuscular disease    GI/Hepatic Neg liver ROS, Bowel prep,GERD  Medicated,  Endo/Other  Hypothyroidism   Renal/GU negative Renal ROS  negative genitourinary   Musculoskeletal  (+) Arthritis , Osteoarthritis,  Fibromyalgia -  Abdominal   Peds negative pediatric ROS (+)  Hematology negative hematology ROS (+)   Anesthesia Other Findings Arthritis    Asthma    Asthmatic bronchitis    BRCA negative Celiac disease    Celiac sprue    Chronic anxiety    Complication of anesthesia  vomiting  COPD    Depression    Environmental allergies   Fibromyalgia  neuropathy all over Gastritis 10/08/2013   GERD (gastroesophageal reflux disease) Hyperlipemia    Hyperlipidemia  Intolerant to statins Hypothyroidism (acquired)   Migraines    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      Reproductive/Obstetrics negative OB ROS                             Anesthesia Physical Anesthesia Plan  ASA: 3  Anesthesia Plan: General   Post-op Pain Management:    Induction: Intravenous  PONV Risk Score and Plan: 2 and Propofol infusion and TIVA  Airway  Management Planned: Natural Airway and Nasal Cannula  Additional Equipment:   Intra-op Plan:   Post-operative Plan:   Informed Consent: I have reviewed the patients History and Physical, chart, labs and discussed the procedure including the risks, benefits and alternatives for the proposed anesthesia with the patient or authorized representative who has indicated his/her understanding and acceptance.       Plan Discussed with: CRNA, Anesthesiologist and Surgeon  Anesthesia Plan Comments:         Anesthesia Quick Evaluation

## 2021-10-17 ENCOUNTER — Encounter: Payer: Self-pay | Admitting: General Surgery

## 2021-10-17 LAB — SURGICAL PATHOLOGY

## 2021-10-18 ENCOUNTER — Telehealth: Payer: Self-pay

## 2021-10-18 ENCOUNTER — Ambulatory Visit (INDEPENDENT_AMBULATORY_CARE_PROVIDER_SITE_OTHER): Payer: Medicare HMO | Admitting: Obstetrics & Gynecology

## 2021-10-18 ENCOUNTER — Encounter: Payer: Self-pay | Admitting: Obstetrics & Gynecology

## 2021-10-18 ENCOUNTER — Other Ambulatory Visit: Payer: Self-pay

## 2021-10-18 ENCOUNTER — Other Ambulatory Visit (HOSPITAL_COMMUNITY)
Admission: RE | Admit: 2021-10-18 | Discharge: 2021-10-18 | Disposition: A | Discharge status: Home / Self Care | Payer: Medicare HMO | Source: Ambulatory Visit | Attending: Obstetrics & Gynecology | Admitting: Obstetrics & Gynecology

## 2021-10-18 VITALS — BP 128/74 | Ht 65.0 in | Wt 191.0 lb

## 2021-10-18 DIAGNOSIS — R87610 Atypical squamous cells of undetermined significance on cytologic smear of cervix (ASC-US): Secondary | ICD-10-CM | POA: Diagnosis not present

## 2021-10-18 DIAGNOSIS — R87622 Low grade squamous intraepithelial lesion on cytologic smear of vagina (LGSIL): Secondary | ICD-10-CM | POA: Insufficient documentation

## 2021-10-18 DIAGNOSIS — Z01419 Encounter for gynecological examination (general) (routine) without abnormal findings: Secondary | ICD-10-CM | POA: Insufficient documentation

## 2021-10-18 DIAGNOSIS — Z780 Asymptomatic menopausal state: Secondary | ICD-10-CM | POA: Diagnosis not present

## 2021-10-18 MED ORDER — ESTRADIOL 0.1 MG/24HR TD PTWK: MEDICATED_PATCH | TRANSDERMAL | 4 refills | Status: DC

## 2021-10-18 MED ORDER — YUVAFEM 10 MCG VA TABS: 1.0000 | ORAL_TABLET | VAGINAL | 3 refills | Status: DC

## 2021-10-18 NOTE — Patient Instructions (Signed)
Estradiol Vaginal Insert What is this medication? ESTRADIOL (es tra DYE ole) reduces vaginal pain during sex due to menopause. It is an estrogen hormone. This medicine may be used for other purposes; ask your health care provider or pharmacist if you have questions. COMMON BRAND NAME(S): Imvexxy, Vagifem, Yuvafem What should I tell my care team before I take this medication? They need to know if you have any of these conditions: Abnormal vaginal bleeding Blood vessel disease or blood clots Breast, cervical, endometrial, ovarian, liver, or uterine cancer Dementia Diabetes Gallbladder disease Heart disease or recent heart attack High blood pressure High cholesterol High level of calcium in the blood Hysterectomy Kidney disease Liver disease Migraine headaches Protein C/S deficiency Stroke Systemic lupus erythematosus (SLE) Tobacco smoker An unusual or allergic reaction to estrogens, other hormones, medications, foods, dyes, or preservatives Pregnant or trying to get pregnant Breast-feeding How should I use this medication? This medication is for vaginal use only. Do not take by mouth. Follow the directions on the prescription label. Read package directions carefully before using. Wash hands before and after use. Use this medication at bedtime. Do not use it more often than directed. Do not stop using except on your care team's advice. Talk to your care team regarding the use of this medication in children. This medication is not approved for use in children. Overdosage: If you think you have taken too much of this medicine contact a poison control center or emergency room at once. NOTE: This medicine is only for you. Do not share this medicine with others. What if I miss a dose? If you miss a dose, use it as soon as you can. If it is almost time for your next dose, use only that dose. Do not use double or extra doses. What may interact with this medication? Do not take this  medication with any of the following: Aromatase inhibitors like aminoglutethimide, anastrozole, exemestane, letrozole, testolactone This medication may also interact with the following: Antibiotics used to treat tuberculosis like rifabutin, rifampin and rifapentine Raloxifene or tamoxifen Warfarin This list may not describe all possible interactions. Give your health care provider a list of all the medicines, herbs, non-prescription drugs, or dietary supplements you use. Also tell them if you smoke, drink alcohol, or use illegal drugs. Some items may interact with your medicine. What should I watch for while using this medication? Visit your care team for regular checks on your progress. You will need a regular breast and pelvic exam. You should also discuss the need for regular mammograms with your care team, and follow their guidelines. This medication can make your body retain fluid, making your fingers, hands, or ankles swell. Your blood pressure can go up. Contact your care team if you feel you are retaining fluid. Tobacco smoking increases the risk of getting a blood clot or having a stroke, especially if you are more than 66 years old. You are strongly advised not to smoke. If you wear contact lenses and notice visual changes, or if the lenses begin to feel uncomfortable, consult your eye care specialist. If you are going to have elective surgery, you may need to stop taking this medication beforehand. Consult your care team for advice prior to scheduling the surgery. If you have any reason to think you are pregnant; stop taking this medication at once and contact your care team. What side effects may I notice from receiving this medication? Side effects that you should report to your care team as soon as  possible: Allergic reactions--skin rash, itching, hives, swelling of the face, lips, tongue, or throat Blood clot--pain, swelling, or warmth in the leg, shortness of breath, chest  pain Breast tissue changes, new lumps, redness, pain, or discharge from the nipple Gallbladder problems--severe stomach pain, nausea, vomiting, fever Increase in blood pressure Liver injury--right upper belly pain, loss of appetite, nausea, light-colored stool, dark yellow or brown urine, yellowing skin or eyes, unusual weakness or fatigue Stroke--sudden numbness or weakness of the face, arm, or leg, trouble speaking, confusion, trouble walking, loss of balance or coordination, dizziness, severe headache, change in vision Unusual vaginal discharge, itching, or odor Vaginal bleeding after menopause, pelvic pain Side effects that usually do not require medical attention (report these to your care team if they continue or are bothersome): Bloating Breast pain or tenderness Nausea Vaginal irritation at application site Vomiting This list may not describe all possible side effects. Call your doctor for medical advice about side effects. You may report side effects to FDA at 1-800-FDA-1088. Where should I keep my medication? Keep out of the reach of children and pets. Store at room temperature between 15 and 30 degrees C (59 and 86 degrees F). Throw away any unused medication after the expiration date. NOTE: This sheet is a summary. It may not cover all possible information. If you have questions about this medicine, talk to your doctor, pharmacist, or health care provider.  2022 Elsevier/Gold Standard (2020-11-09 00:00:00)

## 2021-10-18 NOTE — Progress Notes (Signed)
HPI:      [name] is a 65 y.o. G0P0000 who LMP was in the past, she presents today for her annual examination.  The patient has no complaints today. The patient is sexually active. Vag dryness and pain still, despite Replens.  Dyspareunia.  Herlast pap: was normal and has prior LGSIL of vagina; and last mammogram: approximate date 2022 and was normal.  The patient does perform self breast exams.  There is notable family history of breast or ovarian cancer in her family. Pt is BRCA NEG. The patient is taking hormone replacement therapy.  Pt had recent spotting, feels is related to yeast infection she treated w Moniostat at similar timeframe.    The patient has regular exercise: yes. The patient denies current symptoms of depression.    GYN Hx: Last Colonoscopy: 1 month  ago. Normal.    PMHx: Past Medical History:  Diagnosis Date   Arthritis    Asthma    Asthmatic bronchitis    BRCA negative 05/2014   MyRisk neg   Celiac disease    Celiac sprue    Chronic anxiety    Complication of anesthesia    vomiting   COPD (chronic obstructive pulmonary disease) ([place])    Depression    Environmental allergies    Family history of breast cancer 05/2014   MyRisk neg; IBIS=29.5%   Fibromyalgia    neuropathy all over   Gastritis 10/08/2013   GERD (gastroesophageal reflux disease)    Hyperlipemia    Hyperlipidemia    Intolerant to statins   Hypothyroidism (acquired)    Increased risk of breast cancer 05/2014   IBIS=29.5%   Increased risk of breast cancer 05/2014   IBIS=29.5%   Migraine    Migraines    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    Past Surgical History:  Procedure Laterality Date   ABDOMINAL HYSTERECTOMY  2006   BLADDER REPAIR     BREAST CYST ASPIRATION Left yrs ago   [facility]   COLONOSCOPY N/A 10/16/2021    Procedure: COLONOSCOPY;  Surgeon: Cristal Qadir Bellow, MD;  Location: [facility] ENDOSCOPY;  Service: Endoscopy;  Laterality: N/A;   COLONOSCOPY WITH ESOPHAGOGASTRODUODENOSCOPY (EGD)     COLONOSCOPY WITH PROPOFOL N/A 08/03/2017   Procedure: COLONOSCOPY WITH PROPOFOL;  Surgeon: Lollie Sails, MD;  Location: [facility] ENDOSCOPY;  Service: Endoscopy;  Laterality: N/A;   COLONOSCOPY WITH PROPOFOL N/A 10/18/2018   Procedure: COLONOSCOPY WITH PROPOFOL;  Surgeon: Lollie Sails, MD;  Location: [facility] ENDOSCOPY;  Service: Endoscopy;  Laterality: N/A;   ESOPHAGOGASTRODUODENOSCOPY N/A 10/07/2013   Procedure: ESOPHAGOGASTRODUODENOSCOPY (EGD);  Surgeon: Danie Binder, MD;  Location: [facility] ENDO SUITE;  Service: Endoscopy;  Laterality: N/A;  patient received heparin at 530am given phenergan 63m IV 30 minutes before   ESOPHAGOGASTRODUODENOSCOPY     x4   EYE SURGERY     lacrimal gland    FLEXIBLE BRONCHOSCOPY N/A 06/04/2016   Procedure: FLEXIBLE BRONCHOSCOPY;  Surgeon: SAllyne Gee MD;  Location: [facility] ORS;  Service: Pulmonary;  Laterality: N/A;   INCONTINENCE SURGERY  2004   NASAL SINUS SURGERY     THYROID LOBECTOMY     TOTAL THYROIDECTOMY     Family History  Problem Relation Age of Onset   Arthritis/Rheumatoid Mother        died in age  44s   Breast cancer Mother 38   Breast cancer Maternal Aunt        X 2. 50's   Breast cancer Maternal Aunt    Lung cancer Maternal Aunt    Breast cancer Maternal Aunt    Breast cancer Maternal Grandmother        40's   Pancreatitis Neg Hx    Colon cancer Neg Hx    Social History   Tobacco Use   Smoking status: Never   Smokeless tobacco: Never  Vaping Use   Vaping Use: Never used  Substance Use Topics   Alcohol use: No   Drug use: No    Current Outpatient Medications:    [START ON 10/21/2021] Estradiol (YUVAFEM) 10 MCG TABS vaginal tablet, Place 1 tablet (10 mcg total) vaginally 2 (two) times a week., Disp: 24 tablet, Rfl: 3   acetaminophen (TYLENOL) 500  MG tablet, Take by mouth as needed., Disp: , Rfl:    albuterol (PROVENTIL HFA;VENTOLIN HFA) 108 (90 Base) MCG/ACT inhaler, Inhale 2 puffs into the lungs every 6 (six) hours as needed for wheezing or shortness of breath., Disp: 1 Inhaler, Rfl: 2   albuterol (PROVENTIL) (2.5 MG/3ML) 0.083% nebulizer solution, Take 3 mLs (2.5 mg total) by nebulization every 6 (six) hours as needed for wheezing or shortness of breath., Disp: 75 mL, Rfl: 6   ALPRAZolam (XANAX) 0.5 MG tablet, Take 0.5 mg by mouth 3 (three) times daily as needed., Disp: , Rfl:    amitriptyline (ELAVIL) 25 MG tablet, Take 1 tablet (25 mg total) by mouth at bedtime., Disp: 90 tablet, Rfl: 1   Ascorbic Acid (VITAMIN C) 100 MG tablet, Take 100 mg by mouth daily. , Disp: , Rfl:    azelastine (ASTELIN) 0.1 % nasal spray, Place 2 sprays into both nostrils 2 (two) times daily. Use in each nostril as directed, Disp: , Rfl:    BREO ELLIPTA 100-25 MCG/INH AEPB, 1 PUFF BY MOUTH ONCE DAILY, Disp: 60 each, Rfl: 2   carisoprodol (SOMA) 350 MG tablet, Take 350 mg by mouth at bedtime. Patient states that she takes 1/2 tablet nightly , Disp: , Rfl:    Dentifrices (BIOTENE DRY MOUTH DT), by Transmucosal route., Disp: , Rfl:    eletriptan (RELPAX) 40 MG tablet, Take 40 mg by mouth as needed for migraine. may repeat in 2 hours if necessary , Disp: , Rfl:    EPINEPHRINE 0.3 mg/0.3 mL IJ SOAJ injection, INJECT CONTENTS OF 1 PEN AS NEEDED FOR ALLERGIC REACTION, Disp: 2 each, Rfl: 1   estradiol (CLIMARA) 0.1 mg/24hr patch, APPLY 1 PATCH TO SKIN ONCE WEEKLY, Disp: 12 patch, Rfl: 4   ezetimibe (ZETIA) 10 MG tablet, Take 1 tablet (10 mg total) by mouth daily., Disp: 90 tablet, Rfl: 1   famotidine (PEPCID) 10 MG tablet, Take 20 mg by mouth. , Disp: , Rfl:    fenofibrate (TRICOR) 145 MG tablet, Take 1 tablet (145 mg total) by mouth daily., Disp: 90 tablet, Rfl: 1   fluticasone (FLONASE) 50 MCG/ACT nasal spray, Place 2 sprays into both nostrils daily., Disp: 16 g, Rfl:  3   folic acid (FOLVITE) 1 MG tablet, Take 1 tablet (1 mg total) by mouth daily., Disp: 90 tablet, Rfl: 1   gabapentin (NEURONTIN) 600 MG tablet, Take 1/2 (one-half) tablet by mouth twice daily, Disp: 45 tablet, Rfl: 3   HYDROcodone-acetaminophen (NORCO) 7.5-325 MG tablet, Take 1-2 tablets by mouth every 6 (six) hours as needed for moderate pain., Disp: 30  tablet, Rfl: 0   hydroxychloroquine (PLAQUENIL) 200 MG tablet, Take 1 tablet (200 mg total) by mouth 2 (two) times daily., Disp: 180 tablet, Rfl: 0   hyoscyamine (ANASPAZ) 0.125 MG TBDP disintergrating tablet, Place 0.125 mg under the tongue every 4 (four) hours as needed for bladder spasms. , Disp: , Rfl:    Lactobacillus (DIGESTIVE HEALTH PROBIOTIC PO), Take 1 tablet by mouth daily. , Disp: , Rfl:    levothyroxine (SYNTHROID) 125 MCG tablet, Take 1 tablet (125 mcg total) by mouth daily., Disp: 90 tablet, Rfl: 3   LINZESS 290 MCG CAPS capsule, Take 1 capsule (290 mcg total) by mouth daily., Disp: 90 capsule, Rfl: 3   Magnesium Oxide (NATRUL MAGNESIUM PO), Take by mouth. , Disp: , Rfl:    montelukast (SINGULAIR) 10 MG tablet, TAKE ONE TABLET BY MOUTH ONCE DAILY FOR ASTHMA, Disp: 90 tablet, Rfl: 3   Multiple Vitamins-Minerals (ZINC PO), Take by mouth daily., Disp: , Rfl:    mupirocin ointment (BACTROBAN) 2 %, as needed. , Disp: , Rfl: 0   pantoprazole (PROTONIX) 40 MG tablet, Take 40 mg by mouth daily. , Disp: , Rfl:    Polyethyl Glycol-Propyl Glycol 0.4-0.3 % SOLN, Apply to eye. , Disp: , Rfl:    PRESCRIPTION MEDICATION, 1 Syringe by Subdermal route once a week. Allergy shot , Disp: , Rfl:    Probiotic Product ([place]) CAPS, Take by mouth. , Disp: , Rfl:    sodium chloride (OCEAN) 0.65 % SOLN nasal spray, Place 1 spray into both nostrils as needed for congestion. , Disp: , Rfl:    sodium chloride 0.9 % nebulizer solution, , Disp: , Rfl:    SUMAtriptan-naproxen (TREXIMET) 85-500 MG tablet, daily as needed. , Disp: , Rfl:     Tuberculin-Allergy Syringes 28G X 1/2" 1 ML MISC, 1 each by Does not apply route every 14 (fourteen) days. For allergy injections, Disp: 12 each, Rfl: 3   venlafaxine XR (EFFEXOR-XR) 150 MG 24 hr capsule, Take 2 capsules (300 mg total) by mouth at bedtime., Disp: 180 capsule, Rfl: 1   Vitamin D, Ergocalciferol, (DRISDOL) 1.25 MG (50000 UNIT) CAPS capsule, Take 1 capsule (50,000 Units total) by mouth every 7 (seven) days. Monday, Disp: 12 capsule, Rfl: 1   vitamin E 1000 UNIT capsule, Take by mouth. , Disp: , Rfl:    White Petrolatum-Mineral Oil ([place] PETROL-MINERAL OIL-LANOLIN) 0.1-0.1 % OINT, , Disp: , Rfl:   Current Facility-Administered Medications:    cyanocobalamin ((VITAMIN B-12)) injection 1,000 mcg, 1,000 mcg, Intramuscular, Once, Abernathy, Alyssa, NP Allergies: Aspirin, Keflex [cephalexin], Azithromycin, Barley grass, Clarithromycin, Metoclopramide, Oat, Oatmeal, Other, Rye grass flower pollen extract [gramineae pollens], Vioxx [rofecoxib], Wheat bran, Wheat extract, Celebrex [celecoxib], and Metoclopramide hcl  Review of Systems  Constitutional:  Positive for malaise/fatigue. Negative for chills and fever.  HENT:  Negative for congestion, sinus pain and sore throat.   Eyes:  Negative for blurred vision and pain.  Respiratory:  Negative for cough and wheezing.   Cardiovascular:  Negative for chest pain and leg swelling.  Gastrointestinal:  Negative for abdominal pain, constipation, diarrhea, heartburn, nausea and vomiting.  Genitourinary:  Negative for dysuria, frequency, hematuria and urgency.  Musculoskeletal:  Negative for back pain, joint pain, myalgias and neck pain.  Skin:  Negative for itching and rash.  Neurological:  Positive for headaches. Negative for dizziness, tremors and weakness.  Endo/Heme/Allergies:  Does not bruise/bleed easily.  Psychiatric/Behavioral:  Negative for depression. The patient is nervous/anxious. The patient does not  have insomnia.    Objective: BP  128/74    Ht _0  (1.651 m)    Wt 191 lb (86.6 kg)    BMI 31.78 kg/m   Filed Weights   10/18/21 1331  Weight: 191 lb (86.6 kg)   Body mass index is 31.78 kg/m. Physical Exam Constitutional:      General: She is not in acute distress.    Appearance: She is well-developed.  Genitourinary:     Vulva, bladder, rectum and urethral meatus normal.     No lesions in the vagina.     Right Labia: No rash, tenderness or lesions.    Left Labia: No tenderness, lesions or rash.    No vaginal bleeding.     Moderate vaginal atrophy present.    Vaginal exam comments: Min prolapse Gr 1 cystocele.      Right Adnexa: not tender and no mass present.    Left Adnexa: not tender and no mass present.    Cervix is absent.     Uterus is absent.     Pelvic exam was performed with patient in the lithotomy position.  Breasts:    Right: No mass, skin change or tenderness.     Left: No mass, skin change or tenderness.  HENT:     Head: Normocephalic and atraumatic. No laceration.     Right Ear: Hearing normal.     Left Ear: Hearing normal.     Mouth/Throat:     Pharynx: Uvula midline.  Eyes:     Pupils: Pupils are equal, round, and reactive to light.  Neck:     Thyroid: No thyromegaly.  Cardiovascular:     Rate and Rhythm: Normal rate and regular rhythm.     Heart sounds: No murmur heard.   No friction rub. No gallop.  Pulmonary:     Effort: Pulmonary effort is normal. No respiratory distress.     Breath sounds: Normal breath sounds. No wheezing.  Abdominal:     General: Bowel sounds are normal. There is no distension.     Palpations: Abdomen is soft.     Tenderness: There is no abdominal tenderness. There is no rebound.  Musculoskeletal:        General: Normal range of motion.     Cervical back: Normal range of motion and neck supple.  Neurological:     Mental Status: She is alert and oriented to person, place, and time.     Cranial Nerves: No cranial nerve deficit.  Skin:    General: Skin  is warm and dry.  Psychiatric:        Judgment: Judgment normal.  Vitals reviewed.    Assessment: Annual Exam 1. Women's annual routine gynecological examination   2. Menopause   3. 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  Vaginal Screening-  Pap smear done today  2. Breast screening- Exam annually and mammogram scheduled  3. Colonoscopy every 10 years, Hemoccult testing after age 51  4. Labs managed by PCP  5. Counseling for hormonal therapy: no change in therapy today as far as patch is dosed Will change from Replens to Vagifem (covered better than Premarin vag cream on formulary) for vag dryness and dyspareunia              6. FRAX - FRAX score for assessing the 10 year probability for fracture calculated and discussed  today.  Based on age and score today, DEXA is not scheduled.    F/U  Return for and Annual when due.  Barnett Applebaum, MD, [facility], [facility] 10/18/2021  1:58 PM

## 2021-10-22 ENCOUNTER — Other Ambulatory Visit: Payer: Self-pay | Admitting: General Surgery

## 2021-10-22 DIAGNOSIS — K6389 Other specified diseases of intestine: Secondary | ICD-10-CM

## 2021-10-23 ENCOUNTER — Telehealth: Payer: Self-pay

## 2021-10-23 ENCOUNTER — Other Ambulatory Visit: Payer: Self-pay | Admitting: General Surgery

## 2021-10-23 ENCOUNTER — Other Ambulatory Visit: Payer: Self-pay

## 2021-10-23 ENCOUNTER — Ambulatory Visit: Payer: Medicare HMO

## 2021-10-23 ENCOUNTER — Other Ambulatory Visit
Admission: RE | Admit: 2021-10-23 | Discharge: 2021-10-23 | Disposition: A | Discharge status: Home / Self Care | Payer: Medicare HMO | Attending: General Surgery | Admitting: General Surgery

## 2021-10-23 ENCOUNTER — Ambulatory Visit (INDEPENDENT_AMBULATORY_CARE_PROVIDER_SITE_OTHER): Payer: Medicare HMO

## 2021-10-23 DIAGNOSIS — J301 Allergic rhinitis due to pollen: Secondary | ICD-10-CM

## 2021-10-23 DIAGNOSIS — K6389 Other specified diseases of intestine: Secondary | ICD-10-CM | POA: Diagnosis not present

## 2021-10-23 LAB — BUN: BUN: 17 mg/dL (ref 8–23)

## 2021-10-23 LAB — CREATININE, SERUM
Creatinine, Ser: 0.92 mg/dL (ref 0.44–1.00)
GFR, Estimated: >60 mL/min (ref >60)

## 2021-10-24 ENCOUNTER — Other Ambulatory Visit: Payer: Self-pay | Admitting: Nurse Practitioner

## 2021-10-24 ENCOUNTER — Telehealth: Payer: Self-pay

## 2021-10-24 DIAGNOSIS — M19041 Primary osteoarthritis, right hand: Secondary | ICD-10-CM

## 2021-10-24 MED ORDER — HYDROCODONE-ACETAMINOPHEN 7.5-325 MG PO TABS: 1.0000 | ORAL_TABLET | Freq: Four times a day (QID) | ORAL | 0 refills | Status: DC | PRN

## 2021-10-24 MED ORDER — ALPRAZOLAM 0.5 MG PO TABS: 0.5000 mg | ORAL_TABLET | Freq: Three times a day (TID) | ORAL | 0 refills | Status: DC | PRN

## 2021-10-24 NOTE — Telephone Encounter (Signed)
Called [facility] and canceled prescription at [place] in [place] for xanax

## 2021-10-25 ENCOUNTER — Other Ambulatory Visit: Payer: Self-pay | Admitting: Internal Medicine

## 2021-10-25 ENCOUNTER — Encounter: Payer: Self-pay | Admitting: Nurse Practitioner

## 2021-10-25 DIAGNOSIS — K219 Gastro-esophageal reflux disease without esophagitis: Secondary | ICD-10-CM | POA: Diagnosis not present

## 2021-10-25 DIAGNOSIS — M19041 Primary osteoarthritis, right hand: Secondary | ICD-10-CM

## 2021-10-25 DIAGNOSIS — J449 Chronic obstructive pulmonary disease, unspecified: Secondary | ICD-10-CM | POA: Diagnosis not present

## 2021-10-25 DIAGNOSIS — J9600 Acute respiratory failure, unspecified whether with hypoxia or hypercapnia: Secondary | ICD-10-CM | POA: Diagnosis not present

## 2021-10-25 LAB — CYTOLOGY - PAP: Diagnosis: UNDETERMINED — AB

## 2021-10-25 MED ORDER — HYDROCODONE-ACETAMINOPHEN 7.5-325 MG PO TABS: ORAL_TABLET | ORAL | 0 refills | Status: DC

## 2021-10-25 NOTE — Telephone Encounter (Signed)
error 

## 2021-10-31 ENCOUNTER — Ambulatory Visit
Admission: RE | Admit: 2021-10-31 | Discharge: 2021-10-31 | Disposition: A | Discharge status: Home / Self Care | Payer: Medicare HMO | Source: Ambulatory Visit | Attending: General Surgery | Admitting: General Surgery

## 2021-10-31 ENCOUNTER — Other Ambulatory Visit: Payer: Self-pay

## 2021-10-31 DIAGNOSIS — J309 Allergic rhinitis, unspecified: Secondary | ICD-10-CM

## 2021-10-31 DIAGNOSIS — M4696 Unspecified inflammatory spondylopathy, lumbar region: Secondary | ICD-10-CM | POA: Diagnosis not present

## 2021-10-31 DIAGNOSIS — K8689 Other specified diseases of pancreas: Secondary | ICD-10-CM | POA: Diagnosis not present

## 2021-10-31 DIAGNOSIS — N898 Other specified noninflammatory disorders of vagina: Secondary | ICD-10-CM | POA: Diagnosis not present

## 2021-10-31 DIAGNOSIS — K6389 Other specified diseases of intestine: Secondary | ICD-10-CM | POA: Insufficient documentation

## 2021-10-31 MED ORDER — CYANOCOBALAMIN 1000 MCG/ML IJ SOLN: 1000.0000 ug | Freq: Once | INTRAMUSCULAR | 3 refills | Status: AC

## 2021-10-31 MED ORDER — IOHEXOL 300 MG/ML  SOLN
100.0000 mL | Freq: Once | INTRAMUSCULAR | Status: AC | PRN
  Administered 2021-10-31: 100 mL via INTRAVENOUS

## 2021-10-31 MED ORDER — "SYRINGE 25G X 5/8"" 3 ML MISC": 3 refills | Status: DC

## 2021-10-31 MED ORDER — "TUBERCULIN-ALLERGY SYRINGES 28G X 1/2"" 1 ML MISC": 1.0000 | 3 refills | Status: DC

## 2021-11-05 ENCOUNTER — Telehealth: Payer: Self-pay

## 2021-11-05 NOTE — Telephone Encounter (Signed)
Phar called for hydrocodone refills they just like to verify due to she is on xanax and soma as per dr advised pt only take as needed

## 2021-11-06 ENCOUNTER — Other Ambulatory Visit: Payer: Self-pay | Admitting: Obstetrics & Gynecology

## 2021-11-06 ENCOUNTER — Telehealth: Payer: Self-pay

## 2021-11-06 DIAGNOSIS — N898 Other specified noninflammatory disorders of vagina: Secondary | ICD-10-CM

## 2021-11-06 NOTE — Telephone Encounter (Signed)
Let her know a [place] has been ordered to be scheduled to follow up on this CT finding Susan [name], schedule [place]

## 2021-11-06 NOTE — Telephone Encounter (Signed)
Pt calling; Dr Dondra Prader has sent [place] pt's results; pt is in town now; if anything needs to be done she would like to go ahead and get it scheduled; will be going to [place] soon.  [phone]

## 2021-11-06 NOTE — Telephone Encounter (Signed)
Contacted patient via phone. Patient aware to contact centralized scheduling. [facility]PH to call with results

## 2021-11-06 NOTE — Telephone Encounter (Signed)
Pt aware and would like [place] as soon as possible as she would like to leave for [place] this weekend.

## 2021-11-07 ENCOUNTER — Telehealth: Payer: Self-pay

## 2021-11-07 NOTE — Telephone Encounter (Signed)
10/30/2021 received a letter from [facility] Assist patient assistance informing us that pt was approved for LINZESS 290 mcg capsule thru 10/26/2022

## 2021-11-11 ENCOUNTER — Telehealth: Payer: Medicare HMO | Admitting: Psychiatry

## 2021-11-13 ENCOUNTER — Ambulatory Visit (HOSPITAL_COMMUNITY)
Admission: RE | Admit: 2021-11-13 | Discharge: 2021-11-13 | Disposition: A | Discharge status: Home / Self Care | Payer: Medicare HMO | Source: Ambulatory Visit | Attending: Obstetrics & Gynecology | Admitting: Obstetrics & Gynecology

## 2021-11-13 ENCOUNTER — Other Ambulatory Visit: Payer: Self-pay

## 2021-11-13 ENCOUNTER — Other Ambulatory Visit: Payer: Self-pay | Admitting: Obstetrics & Gynecology

## 2021-11-13 DIAGNOSIS — N898 Other specified noninflammatory disorders of vagina: Secondary | ICD-10-CM | POA: Insufficient documentation

## 2021-11-13 DIAGNOSIS — Z90722 Acquired absence of ovaries, bilateral: Secondary | ICD-10-CM | POA: Diagnosis not present

## 2021-11-13 DIAGNOSIS — Z90710 Acquired absence of both cervix and uterus: Secondary | ICD-10-CM | POA: Diagnosis not present

## 2021-11-13 NOTE — Progress Notes (Signed)
Discussed results ([place]) with patient (tele) Monitor sx's Fluid collection small and not concerning If shows signs of growth or if having significant deep dyspareunia, then would consider options for drainage or management  Barnett Applebaum, MD, [facility] 11/13/2021  4:18 PM

## 2021-11-27 ENCOUNTER — Telehealth: Payer: Self-pay

## 2021-11-27 NOTE — Telephone Encounter (Signed)
Your pharmacy doesn't have any oxycodone until Friday, if then. She wants it called out to [facility] [address] [place] [phone]

## 2021-12-03 ENCOUNTER — Other Ambulatory Visit: Payer: Self-pay | Admitting: Internal Medicine

## 2021-12-03 DIAGNOSIS — E785 Hyperlipidemia, unspecified: Secondary | ICD-10-CM

## 2021-12-03 DIAGNOSIS — G4733 Obstructive sleep apnea (adult) (pediatric): Secondary | ICD-10-CM | POA: Diagnosis not present

## 2021-12-04 ENCOUNTER — Telehealth (INDEPENDENT_AMBULATORY_CARE_PROVIDER_SITE_OTHER): Payer: Medicare HMO | Admitting: Nurse Practitioner

## 2021-12-04 ENCOUNTER — Encounter: Payer: Self-pay | Admitting: Nurse Practitioner

## 2021-12-04 VITALS — Temp 99.3°F | Resp 16

## 2021-12-04 DIAGNOSIS — J069 Acute upper respiratory infection, unspecified: Secondary | ICD-10-CM

## 2021-12-04 DIAGNOSIS — U071 COVID-19: Secondary | ICD-10-CM

## 2021-12-04 DIAGNOSIS — R051 Acute cough: Secondary | ICD-10-CM | POA: Diagnosis not present

## 2021-12-04 DIAGNOSIS — R0602 Shortness of breath: Secondary | ICD-10-CM | POA: Diagnosis not present

## 2021-12-04 MED ORDER — PREDNISONE 10 MG PO TABS: ORAL_TABLET | ORAL | 0 refills | Status: DC

## 2021-12-04 MED ORDER — HYDROCOD POLI-CHLORPHE POLI ER 10-8 MG/5ML PO SUER: 5.0000 mL | Freq: Two times a day (BID) | ORAL | 0 refills | Status: DC | PRN

## 2021-12-04 MED ORDER — NIRMATRELVIR/RITONAVIR (PAXLOVID)TABLET: 3.0000 | ORAL_TABLET | Freq: Two times a day (BID) | ORAL | 0 refills | Status: AC

## 2021-12-04 NOTE — Progress Notes (Signed)
[facility] [place], [place] [address]  Internal MEDICINE  Telephone Visit  Patient Name: Susan [name]  [id number]  [id number]  Date of Service: 12/04/2021  I connected with the patient at 11:55 AM by telephone and verified the patients identity using two identifiers.   I discussed the limitations, risks, security and privacy concerns of performing an evaluation and management service by telephone and the availability of in person appointments. I also discussed with the patient that there may be a patient responsible charge related to the service.  The patient expressed understanding and agrees to proceed.    Chief Complaint  Patient presents with   Telephone Screen   Telephone Assessment   Generalized Body Aches    Had a fever last night of 101   Sore Throat   Cough   Ear Pain    HPI Susan [name] presents for a telehealth virtual visit for covid positive upper respiratory infection. Her symptoms started 2 days ago. She reports fever, body aches, chills, sore throat, cough, headache, ear pain, and fatigue. She has been using her nebulizer treatments and her inhaler more lately.    Current Medication: Outpatient Encounter Medications as of 12/04/2021  Medication Sig   acetaminophen (TYLENOL) 500 MG tablet Take by mouth as needed.   albuterol (PROVENTIL HFA;VENTOLIN HFA) 108 (90 Base) MCG/ACT inhaler Inhale 2 puffs into the lungs every 6 (six) hours as needed for wheezing or shortness of breath.   albuterol (PROVENTIL) (2.5 MG/3ML) 0.083% nebulizer solution Take 3 mLs (2.5 mg total) by nebulization every 6 (six) hours as needed for wheezing or shortness of breath.   ALPRAZolam (XANAX) 0.5 MG tablet Take 1 tablet (0.5 mg total) by mouth 3 (three) times daily as needed for anxiety.   amitriptyline (ELAVIL) 25 MG tablet Take 1 tablet (25 mg total) by mouth at bedtime.   Ascorbic Acid (VITAMIN C) 100 MG tablet Take 100 mg by mouth daily.    azelastine (ASTELIN) 0.1 %  nasal spray Place 2 sprays into both nostrils 2 (two) times daily. Use in each nostril as directed   BREO ELLIPTA 100-25 MCG/INH AEPB 1 PUFF BY MOUTH ONCE DAILY   carisoprodol (SOMA) 350 MG tablet Take 350 mg by mouth at bedtime. Patient states that she takes 1/2 tablet nightly    chlorpheniramine-HYDROcodone (TUSSIONEX PENNKINETIC ER) 10-8 MG/5ML Take 5 mLs by mouth every 12 (twelve) hours as needed for cough.   Dentifrices (BIOTENE DRY MOUTH DT) by Transmucosal route.   eletriptan (RELPAX) 40 MG tablet Take 40 mg by mouth as needed for migraine. may repeat in 2 hours if necessary    EPINEPHRINE 0.3 mg/0.3 mL IJ SOAJ injection INJECT CONTENTS OF 1 PEN AS NEEDED FOR ALLERGIC REACTION   estradiol (CLIMARA) 0.1 mg/24hr patch APPLY 1 PATCH TO SKIN ONCE WEEKLY   Estradiol (YUVAFEM) 10 MCG TABS vaginal tablet Place 1 tablet (10 mcg total) vaginally 2 (two) times a week.   ezetimibe (ZETIA) 10 MG tablet Take 1 tablet by mouth once daily   famotidine (PEPCID) 10 MG tablet Take 20 mg by mouth.    fenofibrate (TRICOR) 145 MG tablet Take 1 tablet (145 mg total) by mouth daily.   fluticasone (FLONASE) 50 MCG/ACT nasal spray Place 2 sprays into both nostrils daily.   folic acid (FOLVITE) 1 MG tablet Take 1 tablet (1 mg total) by mouth daily.   gabapentin (NEURONTIN) 600 MG tablet Take 1/2 (one-half) tablet by mouth twice daily   HYDROcodone-acetaminophen ([place])  7.5-325 MG tablet One tab po tid for pain prn only   hydroxychloroquine (PLAQUENIL) 200 MG tablet Take 1 tablet (200 mg total) by mouth 2 (two) times daily.   hyoscyamine (ANASPAZ) 0.125 MG TBDP disintergrating tablet Place 0.125 mg under the tongue every 4 (four) hours as needed for bladder spasms.    Lactobacillus (DIGESTIVE HEALTH PROBIOTIC PO) Take 1 tablet by mouth daily.    levothyroxine (SYNTHROID) 125 MCG tablet Take 1 tablet (125 mcg total) by mouth daily.   LINZESS 290 MCG CAPS capsule Take 1 capsule (290 mcg total) by mouth daily.    Magnesium Oxide (NATRUL MAGNESIUM PO) Take by mouth.    montelukast (SINGULAIR) 10 MG tablet TAKE ONE TABLET BY MOUTH ONCE DAILY FOR ASTHMA   Multiple Vitamins-Minerals (ZINC PO) Take by mouth daily.   mupirocin ointment (BACTROBAN) 2 % as needed.    nirmatrelvir/ritonavir EUA (PAXLOVID) 20 x 150 MG & 10 x 100MG TABS Take 3 tablets by mouth 2 (two) times daily for 5 days.   pantoprazole (PROTONIX) 40 MG tablet Take 40 mg by mouth daily.    Polyethyl Glycol-Propyl Glycol 0.4-0.3 % SOLN Apply to eye.    predniSONE (DELTASONE) 10 MG tablet Take one tab 3 x day for 3 days, then take one tab 2 x a day for 3 days and then take one tab a day for 3 days for URI   PRESCRIPTION MEDICATION 1 Syringe by Subdermal route once a week. Allergy shot    Probiotic Product (Dresser) CAPS Take by mouth.    sodium chloride (OCEAN) 0.65 % SOLN nasal spray Place 1 spray into both nostrils as needed for congestion.    sodium chloride 0.9 % nebulizer solution    SUMAtriptan-naproxen (TREXIMET) 85-500 MG tablet daily as needed.    Syringe/Needle, Disp, (SYRINGE 3CC/25GX5/8") 25G X 5/8" 3 ML MISC Use as directed once a month   Tuberculin-Allergy Syringes 28G X 1/2" 1 ML MISC 1 each by Does not apply route every 14 (fourteen) days. For allergy injections   venlafaxine XR (EFFEXOR-XR) 150 MG 24 hr capsule Take 2 capsules (300 mg total) by mouth at bedtime.   Vitamin D, Ergocalciferol, (DRISDOL) 1.25 MG (50000 UNIT) CAPS capsule Take 1 capsule (50,000 Units total) by mouth every 7 (seven) days. Monday   vitamin E 1000 UNIT capsule Take by mouth.    White Petrolatum-Mineral Oil ([place] PETROL-MINERAL OIL-LANOLIN) 0.1-0.1 % OINT    Facility-Administered Encounter Medications as of 12/04/2021  Medication   cyanocobalamin ((VITAMIN B-12)) injection 1,000 mcg    Surgical History: Past Surgical History:  Procedure Laterality Date   ABDOMINAL HYSTERECTOMY  2006   BLADDER REPAIR     BREAST CYST ASPIRATION Left yrs  ago   [place] N/A 10/16/2021   Procedure: COLONOSCOPY;  Surgeon: Robert Bellow, MD;  Location: [facility] ENDOSCOPY;  Service: Endoscopy;  Laterality: N/A;   COLONOSCOPY WITH ESOPHAGOGASTRODUODENOSCOPY (EGD)     COLONOSCOPY WITH PROPOFOL N/A 08/03/2017   Procedure: COLONOSCOPY WITH PROPOFOL;  Surgeon: Lollie Sails, MD;  Location: [facility] ENDOSCOPY;  Service: Endoscopy;  Laterality: N/A;   COLONOSCOPY WITH PROPOFOL N/A 10/18/2018   Procedure: COLONOSCOPY WITH PROPOFOL;  Surgeon: Lollie Sails, MD;  Location[facility] ENDOSCOPY;  Service: Endoscopy;  Laterality: N/A;   ESOPHAGOGASTRODUODENOSCOPY N/A 10/07/2013   Procedure: ESOPHAGOGASTRODUODENOSCOPY (EGD);  Surgeon: Danie Binder, MD;  Location: [facility] ENDO SUITE;  Service: Endoscopy;  Laterality: N/A;  patient received heparin at 530am given phenergan  8m IV 30 minutes before   ESOPHAGOGASTRODUODENOSCOPY     x4   EYE SURGERY     lacrimal gland    FLEXIBLE BRONCHOSCOPY N/A 06/04/2016   Procedure: FLEXIBLE BRONCHOSCOPY;  Surgeon: SAllyne Gee MD;  Location: [facility] ORS;  Service: Pulmonary;  Laterality: N/A;   INCONTINENCE SURGERY  2004   NASAL SINUS SURGERY     THYROID LOBECTOMY     TOTAL THYROIDECTOMY      Medical History: Past Medical History:  Diagnosis Date   Arthritis    Asthma    Asthmatic bronchitis    BRCA negative 05/2014   MyRisk neg   Celiac disease    Celiac sprue    Chronic anxiety    Complication of anesthesia    vomiting   COPD (chronic obstructive pulmonary disease) ([place]    Depression    Environmental allergies    Family history of breast cancer 05/2014   MyRisk neg; IBIS=29.5%   Fibromyalgia    neuropathy all over   Gastritis 10/08/2013   GERD (gastroesophageal reflux disease)    Hyperlipemia    Hyperlipidemia    Intolerant to statins   Hypothyroidism (acquired)    Increased risk of breast cancer 05/2014   IBIS=29.5%   Increased risk of breast cancer 05/2014   IBIS=29.5%    Migraine    Migraines    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     Family History: Family History  Problem Relation Age of Onset   Arthritis/Rheumatoid Mother        died in age 2250s  Breast cancer Mother 431  Breast cancer Maternal Aunt        X 2. 50's   Breast cancer Maternal Aunt    Lung cancer Maternal Aunt    Breast cancer Maternal Aunt    Breast cancer Maternal Grandmother        40's   Pancreatitis Neg Hx    Colon cancer Neg Hx     Social History   Socioeconomic History   Marital status: Married    Spouse name: Not on file   Number of children: 0   Years of education: Not on file   Highest education level: Not on file  Occupational History   Not on file  Tobacco Use   Smoking status: Never   Smokeless tobacco: Never  Vaping Use   Vaping Use: Never used  Substance and Sexual Activity   Alcohol use: No   Drug use: No   Sexual activity: Yes    Birth control/protection: Surgical  Other Topics Concern   Not on file  Social History Narrative   Not on file   Social Determinants of Health   Financial Resource Strain: Low Risk    Difficulty of Paying Living Expenses: Not very hard  Food Insecurity: Not on file  Transportation Needs: Not on file  Physical Activity: Not on file  Stress: Not on file  Social Connections: Not on file  Intimate Partner Violence: Not on file      Review of Systems  Constitutional:  Positive for chills, fatigue and fever.  HENT:  Positive for congestion, ear pain, postnasal drip, rhinorrhea, sinus pressure, sinus pain, sneezing and sore throat.   Respiratory:  Positive for cough, chest tightness and shortness of breath. Negative for wheezing.   Cardiovascular:  Negative.  Negative for chest pain and palpitations.  Gastrointestinal: Negative.  Negative for abdominal pain, constipation,  diarrhea, nausea and vomiting.  Musculoskeletal: Negative.  Negative for myalgias.  Skin: Negative.  Negative for rash.  Neurological:  Positive for headaches. Negative for dizziness and light-headedness.   Vital Signs: Temp 99.3 F (37.4 C)    Resp 16    Observation/Objective: She is alert and oriented and engages in conversation appropriately. She does not appear to be in any acute distress over video call but is ill-appearing.     Assessment/Plan: 1. Upper respiratory tract infection due to COVID-19 virus Paxlovid prescribed, GFR is normal - nirmatrelvir/ritonavir EUA (PAXLOVID) 20 x 150 MG & 10 x 100MG TABS; Take 3 tablets by mouth 2 (two) times daily for 5 days.  Dispense: 30 tablet; Refill: 0  2. Shortness of breath Prednisone taper prescribed to decreased inflammation and improve breathing - predniSONE (DELTASONE) 10 MG tablet; Take one tab 3 x day for 3 days, then take one tab 2 x a day for 3 days and then take one tab a day for 3 days for URI  Dispense: 18 tablet; Refill: 0  3. Acute cough Tussionex prescribed for symptomatic relief of cough.  - chlorpheniramine-HYDROcodone (TUSSIONEX PENNKINETIC ER) 10-8 MG/5ML; Take 5 mLs by mouth every 12 (twelve) hours as needed for cough.  Dispense: 200 mL; Refill: 0   General Counseling: Susan [name] verbalizes understanding of the findings of today's phone visit and agrees with plan of treatment. I have discussed any further diagnostic evaluation that may be needed or ordered today. We also reviewed her medications today. she has been encouraged to call the office with any questions or concerns that should arise related to todays visit.  Return if symptoms worsen or fail to improve.   No orders of the defined types were placed in this encounter.   Meds ordered this encounter  Medications   nirmatrelvir/ritonavir EUA (PAXLOVID) 20 x 150 MG & 10 x 100MG TABS    Sig: Take 3 tablets by mouth 2 (two) times daily for 5 days.    Dispense:   30 tablet    Refill:  0   predniSONE (DELTASONE) 10 MG tablet    Sig: Take one tab 3 x day for 3 days, then take one tab 2 x a day for 3 days and then take one tab a day for 3 days for URI    Dispense:  18 tablet    Refill:  0   chlorpheniramine-HYDROcodone (TUSSIONEX PENNKINETIC ER) 10-8 MG/5ML    Sig: Take 5 mLs by mouth every 12 (twelve) hours as needed for cough.    Dispense:  200 mL    Refill:  0    Time spent:10 Minutes Time spent with patient included reviewing progress notes, labs, imaging studies, and discussing plan for follow up.  [place] Controlled Substance Database was reviewed by me for overdose risk score (ORS) if appropriate.  This patient was seen by [name], FNP-C in collaboration with Dr. Clayborn Bigness as a part of collaborative care agreement.  Sonny Poth R. Valetta Fuller, MSN, FNP-C Internal medicine

## 2022-01-01 ENCOUNTER — Telehealth: Payer: Self-pay | Admitting: Student-PharmD

## 2022-01-01 NOTE — Progress Notes (Addendum)
COPD Review Call  ? ?Susan, [name] [id number] ?67 years, Female  DOB: 08/27/1956  M: [phone] ? ?COPD Review (HC) ?Completed by Charlann Lange on 01/01/2022 ? ?Chart Review ?Is the patient enrolled in RPM with [facility]?: No ? ?Eosinophil Count (last): 239 ?on: 07/11/2021 ? ?Last Spirometry Score (FEV1): 2.1 ?on: 10/02/2020 ? ?Last CAT Score: Unknown ?on: 12/31/2021 ? ?What recent interventions/DTPs have been made by any provider to improve the patient's conditions in the last 3 months?:  ? ?Consults:  ?10/09/21 Video Visit: Susan Clay, MD. [name] Depression. No other information given. ? ?10/18/21 Susan Jacobson, MD For Women's annual routine gynecological examination. STOPPED Vaginal Lubricant STARTED YUVAFEM 10 MCG 2 times weekly. ? ?Office Visit: ?12/04/21 Susan Osgood, NP For Upper respiratory tract infection due to COVID-19 virus. STARTED Hydrocod Polst-Chlorphen Polst 10-8 MG/5ML 5 mLs Oral Every 12 hours PRN, Nirmatrelvir-Ritonavir 20 x 150 MG & 10 x 100MG 3 tablets Oral 2 times daily, and predniSONE 10 MG Take one tab 3 x day for 3 days, then take one tab 2 x a day for 3 days and then take one tab a day for 3 days for URI. ? ?Any recent hospitalizations or ED visits since last visit with CPP?: Yes ? ?Brief Summary (including Medication and/or Diagnosis changes):: 10/16/21 [facility] [place], Susan Gleason, MD ( 2 hours) For COLONOSCOPY. No medication changes. ? ?Adherence Review ?Adherence rates for STAR metric medications: None. ? ?Adherence rates for medications indicated for disease state being reviewed: None. ? ?Does the patient have >5 day gap between last estimated fill dates for any of the above medications?: No ? ?Disease State Questions ?Able to connect with the Patient?: Yes ? ?How often do you use your rescue inhaler Albuterol?: 1-2 times per week ? ?Is patient prescribed a maintenance inhaler?: Yes ? ?How often is patient using their  maintenance inhaler?: Daily ?CAT Assessment: Done ? ?How often are you coughing?: 3 ? ?How much mucus are you feeling in your chest?: 2 ? ?How much chest tightness are you having?: 1 ? ?How is your breath when you walk up a hill or flight of stairs?: 3 ? ?How are your limitations to doing activities at home?: 1 ? ?How would you rate your confidence in leaving your home due to your lung condition?: 0 (I am confident leaving my home despite my lung condition) ? ?How are you sleeping?: 4 ? ?How is your energy level?: 3 ? ?Total CAT Score: 17 ? ?Engagement Notes ?Charlann Lange on 12/31/2021 03:53 PM ?[facility] Chart Review: 13 min 12/31/21 ?[facility] Assessment call time spent: 17 min 01/01/22 ? ?Charlann Lange, RMA ?Healthcare Concierge  ?[phone] ? ?Pharmacist Review ?Adherence gaps identified?: No ?Drug Therapy Problems identified?: No ?Assessment: Controlled ? ?Susan [name] ?Clinical Pharmacist ? ?

## 2022-01-07 ENCOUNTER — Telehealth: Payer: Self-pay | Admitting: Student-PharmD

## 2022-01-07 NOTE — Progress Notes (Addendum)
General Review Call  ? ?Susan [name], Susan [name] [id number] ?42 years, Female  DOB: 04-19-1956  M: [phone] ? ?General Review (HC) ?Completed by Susan [name] on 01/07/2022 ? ?Chart Review ?What recent interventions/DTPs have been made by any provider to improve the patient's conditions in the last 3 months?: None. ? ?Any recent hospitalizations or ED visits since last visit with CPP?: No ? ?Adherence Review ?Adherence rates for STAR metric medications: None. ? ?Adherence rates for medications indicated for disease state being reviewed: None. ? ?Does the patient have >5 day gap between last estimated fill dates for any of the above medications?: No ? ?Disease State Questions ?Able to connect with the Patient?: Yes ? ?Did patient have any problems with their health recently?: No ? ?Did patient have any problems with their pharmacy?: No ? ?Does patient have any issues or side effects with their medications?: No ? ?Additional  information to pass to Patient's CPP?: No ? ?Anything we can do to help take better care of Patient?: No ? ?Engagement Notes ?[name], Susan on 01/07/2022 01:04 PM ?[facility] Chart Review: 5 min 01/07/22 ?HC Assessment call time spent: 5 min 01/07/22 ?CP Review: 10 min 01/07/22 ? ?Susan [name], RMA ?Healthcare Concierge  ?[phone] ? ?Pharmacist Review ?Adherence gaps identified?: No ?Drug Therapy Problems identified?: No ?Assessment: Controlled ? ?Susan [name] ?Clinical Pharmacist ? ?

## 2022-02-04 ENCOUNTER — Other Ambulatory Visit: Payer: Self-pay

## 2022-02-04 ENCOUNTER — Telehealth: Payer: Self-pay

## 2022-02-04 ENCOUNTER — Other Ambulatory Visit (HOSPITAL_COMMUNITY): Payer: Self-pay | Admitting: Psychiatry

## 2022-02-04 DIAGNOSIS — E785 Hyperlipidemia, unspecified: Secondary | ICD-10-CM

## 2022-02-04 DIAGNOSIS — F3342 Major depressive disorder, recurrent, in full remission: Secondary | ICD-10-CM

## 2022-02-04 DIAGNOSIS — F411 Generalized anxiety disorder: Secondary | ICD-10-CM

## 2022-02-04 MED ORDER — FENOFIBRATE 145 MG PO TABS: 145.0000 mg | ORAL_TABLET | Freq: Every day | ORAL | 1 refills | Status: DC

## 2022-02-04 NOTE — Telephone Encounter (Signed)
Lmom that we send to local [facility] please call them transfer pres to [facility] in [facility] ?

## 2022-02-11 NOTE — Progress Notes (Signed)
? ?Office Visit Note ? ?Patient: Susan [name]             ?Date of Birth: [birth year]           ?MRN: [id number]             ?PCP: Lavera Guise, MD ?Referring: Lavera Guise, MD ?Visit Date: 02/25/2022 ?Occupation: _0 @ ? ?Subjective:  ?Sicca symptoms  ? ?History of Present Illness: Susan [name] is a 66 y.o. female with history of Sjogren's syndrome, osteoarthritis, and fibromyalgia. She restarted on plaquenil in may 2022.  She is currently taking Plaquenil 200 mg 1 tablet by mouth twice daily.  She is tolerating Plaquenil without any side effects.  She continues to have chronic sicca symptoms and has been using artificial tears, Biotene products, and XyliMelts for symptomatic relief.  She is seeing her ophthalmologist on a yearly basis and the dentist every 6 months.  She has had intermittent discomfort in her lower back.  She previously had back injections which provided significant relief.  She states that she may follow-up with her back specialist if her symptoms persist or worsen.  She is not currently experiencing any symptoms of radiculopathy.  She states her pain is exacerbated by standing for prolonged periods of time.  She denies any muscle spasms in her lower back at this time.  She is occasional pain and stiffness in her hands but denies any joint swelling.  She denies any other new concerns or medical conditions.  ? ?Activities of Daily Living:  ?Patient reports morning stiffness for 30 minutes.   ?Patient Reports nocturnal pain.  ?Difficulty dressing/grooming: Denies ?Difficulty climbing stairs: Denies ?Difficulty getting out of chair: Denies ?Difficulty using hands for taps, buttons, cutlery, and/or writing: Reports ? ?Review of Systems  ?Constitutional:  Positive for fatigue.  ?HENT:  Positive for mouth sores, mouth dryness and nose dryness.   ?Eyes:  Positive for pain, itching and dryness. Negative for visual disturbance.  ?Respiratory:  Positive for shortness of breath. Negative for  cough, hemoptysis and difficulty breathing.   ?Cardiovascular:  Negative for chest pain, palpitations, hypertension and swelling in legs/feet.  ?Gastrointestinal:  Negative for blood in stool, constipation and diarrhea.  ?Endocrine: Negative for increased urination.  ?Genitourinary:  Negative for difficulty urinating and painful urination.  ?Musculoskeletal:  Positive for joint pain, joint pain, myalgias, morning stiffness, muscle tenderness and myalgias. Negative for joint swelling and muscle weakness.  ?Skin:  Negative for color change, pallor, rash, hair loss, nodules/bumps, redness, skin tightness, ulcers and sensitivity to sunlight.  ?Allergic/Immunologic: Negative for susceptible to infections.  ?Neurological:  Positive for memory loss and weakness. Negative for dizziness, numbness and headaches.  ?Hematological:  Negative for bruising/bleeding tendency and swollen glands.  ?Psychiatric/Behavioral:  Negative for depressed mood, confusion and sleep disturbance. The patient is not nervous/anxious.   ? ?PMFS History:  ?Patient Active Problem List  ? Diagnosis Date Noted  ? Lymphadenopathy of head and neck 11/04/2020  ? Screening for osteoporosis 11/04/2020  ? Need for vaccination against Streptococcus pneumoniae using pneumococcal conjugate vaccine 13 11/04/2020  ? Menopause 10/16/2020  ? 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 Essential tremor 06/20/2020  ? OSA (obstructive sleep apnea) 03/26/2020  ? Chronic wrist pain, right 11/02/2019  ? Chronic pain of left wrist 11/02/2019  ? Pain in joints of right hand 11/02/2019  ? Pain in joint of left hand 11/02/2019  ? Primary osteoarthritis of both hands 11/02/2019  ? MDD (  major depressive disorder), recurrent, in partial remission ([place]) 10/05/2019  ? Dysuria 04/03/2019  ? Generalized anxiety disorder 02/28/2019  ? Chronic pain of both knees 03/01/2018  ? Encounter for general adult medical examination with abnormal findings  03/01/2018  ? Primary osteoarthritis of both first carpometacarpal joints 03/01/2018  ? Chronic obstructive pulmonary disease, unspecified ([place]) 12/04/2017  ? Shortness of breath 12/04/2017  ? Allergic rhinitis, unspecified 12/04/2017  ? Low grade squamous intraepith lesion on cytologic smear vagina (lgsil) 09/09/2017  ? Vaginal atrophy 09/01/2017  ? Chronic right-sided low back pain without sciatica 01/05/2017  ? Primary Sjogren's syndrome ([place]) 01/05/2017  ? CD (celiac disease) 04/17/2015  ? Gastroesophageal reflux disease with esophagitis 04/17/2015  ? Hx of Sjogren's disease ([place]) 04/17/2015  ? Acute asthma exacerbation 10/14/2013  ? Acute respiratory failure ([place]) 10/14/2013  ? Gastritis 10/08/2013  ? Bradycardia 10/07/2013  ? Esophageal spasm 10/06/2013  ? Substernal precordial chest pain 10/06/2013  ? Chronic anxiety 10/06/2013  ? GERD (gastroesophageal reflux disease) 10/06/2013  ? Chronic asthma 10/06/2013  ? 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 Depression with anxiety 10/06/2013  ? Fibromyalgia 10/06/2013  ? Hypothyroidism 10/06/2013  ? Chronic migraine 10/06/2013  ?  ?Past Medical History:  ?Diagnosis Date  ? Arthritis   ? Asthma   ? Asthmatic bronchitis   ? BRCA negative 05/2014  ? MyRisk neg  ? Celiac disease   ? Celiac sprue   ? Chronic anxiety   ? Complication of anesthesia   ? vomiting  ? COPD (chronic obstructive pulmonary disease) ([place])   ? Depression   ? Environmental allergies   ? Family history of breast cancer 05/2014  ? MyRisk neg; IBIS=29.5%  ? Fibromyalgia   ? neuropathy all over  ? Gastritis 10/08/2013  ? GERD (gastroesophageal reflux disease)   ? Hyperlipemia   ? Hyperlipidemia   ? Intolerant to statins  ? Hypothyroidism (acquired)   ? Increased risk of breast cancer 05/2014  ? IBIS=29.5%  ? Increased risk of breast cancer 05/2014  ? IBIS=29.5%  ? Migraine   ? Migraines   ? 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 Pancreatitis   ? Sjogren's disease ([place])   ? Spastic colon   ? Thyroid cancer ([place])  1990 and 1994  ? Total thyroidectomy with radioactive iodine.   ?  ?Family History  ?Problem Relation Age of Onset  ? Arthritis/Rheumatoid Mother   ?     died in age 63s  ? Breast cancer Mother 37  ? Breast cancer Maternal Aunt   ?     X 2. 50's  ? Breast cancer Maternal Aunt   ? Lung cancer Maternal Aunt   ? Breast cancer Maternal Aunt   ? Breast cancer Maternal Grandmother   ?     40's  ? Pancreatitis Neg Hx   ? Colon cancer Neg Hx   ? ?Past Surgical History:  ?Procedure Laterality Date  ? ABDOMINAL HYSTERECTOMY  2006  ? BLADDER REPAIR    ? BREAST CYST ASPIRATION Left yrs ago  ? CHOLECYSTECTOMY  1989  ? COLONOSCOPY N/A 10/16/2021  ? Procedure: COLONOSCOPY;  Surgeon: Robert Bellow, MD;  Location: [facility] ENDOSCOPY;  Service: Endoscopy;  Laterality: N/A;  ? COLONOSCOPY WITH ESOPHAGOGASTRODUODENOSCOPY (EGD)    ? COLONOSCOPY WITH PROPOFOL N/A 08/03/2017  ? Procedure: COLONOSCOPY WITH PROPOFOL;  Surgeon: Lollie Sails, MD;  Location: [facility] ENDOSCOPY;  Service: Endoscopy;  Laterality: N/A;  ? COLONOSCOPY WITH PROPOFOL N/A 10/18/2018  ? Procedure: COLONOSCOPY WITH PROPOFOL;  Surgeon:  Lollie Sails, MD;  Location: [facility] ENDOSCOPY;  Service: Endoscopy;  Laterality: N/A;  ? ESOPHAGOGASTRODUODENOSCOPY N/A 10/07/2013  ? Procedure: ESOPHAGOGASTRODUODENOSCOPY (EGD);  Surgeon: Danie Binder, MD;  Location: [facility] ENDO SUITE;  Service: Endoscopy;  Laterality: N/A;  patient received heparin at 530am ?given phenergan 83m IV 30 minutes before  ? ESOPHAGOGASTRODUODENOSCOPY    ? x4  ? EYE SURGERY    ? lacrimal gland   ? FLEXIBLE BRONCHOSCOPY N/A 06/04/2016  ? Procedure: FLEXIBLE BRONCHOSCOPY;  Surgeon: SAllyne Gee MD;  Location: [facility] ORS;  Service: Pulmonary;  Laterality: N/A;  ? INCONTINENCE SURGERY  2004  ? NASAL SINUS SURGERY    ? THYROID LOBECTOMY    ? TOTAL THYROIDECTOMY    ? ?Social History  ? ?Social History Narrative  ? Not on file  ? ?Immunization History  ?Administered Date(s) Administered  ? Influenza Inj Mdck  Quad Pf 07/22/2018, 07/05/2019, 06/27/2020, 07/19/2021  ? Influenza-Unspecified 08/17/2017  ? Moderna Sars-Covid-2 Vaccination 12/03/2019, 01/24/2020, 08/13/2020, 05/15/2021  ? Pneumococcal Conjugate-13 12/23/2

## 2022-02-21 DIAGNOSIS — G4733 Obstructive sleep apnea (adult) (pediatric): Secondary | ICD-10-CM | POA: Diagnosis not present

## 2022-02-25 ENCOUNTER — Ambulatory Visit (INDEPENDENT_AMBULATORY_CARE_PROVIDER_SITE_OTHER): Payer: Medicare HMO | Admitting: Physician Assistant

## 2022-02-25 ENCOUNTER — Encounter: Payer: Self-pay | Admitting: Physician Assistant

## 2022-02-25 VITALS — BP 126/80 | HR 82 | Ht 65.0 in | Wt 195.2 lb

## 2022-02-25 DIAGNOSIS — M19072 Primary osteoarthritis, left ankle and foot: Secondary | ICD-10-CM

## 2022-02-25 DIAGNOSIS — Z79899 Other long term (current) drug therapy: Secondary | ICD-10-CM | POA: Diagnosis not present

## 2022-02-25 DIAGNOSIS — M25511 Pain in right shoulder: Secondary | ICD-10-CM

## 2022-02-25 DIAGNOSIS — F3341 Major depressive disorder, recurrent, in partial remission: Secondary | ICD-10-CM | POA: Diagnosis not present

## 2022-02-25 DIAGNOSIS — J449 Chronic obstructive pulmonary disease, unspecified: Secondary | ICD-10-CM

## 2022-02-25 DIAGNOSIS — Z85850 Personal history of malignant neoplasm of thyroid: Secondary | ICD-10-CM

## 2022-02-25 DIAGNOSIS — M170 Bilateral primary osteoarthritis of knee: Secondary | ICD-10-CM

## 2022-02-25 DIAGNOSIS — M3509 Sicca syndrome with other organ involvement: Secondary | ICD-10-CM | POA: Diagnosis not present

## 2022-02-25 DIAGNOSIS — M19041 Primary osteoarthritis, right hand: Secondary | ICD-10-CM | POA: Diagnosis not present

## 2022-02-25 DIAGNOSIS — Z8709 Personal history of other diseases of the respiratory system: Secondary | ICD-10-CM

## 2022-02-25 DIAGNOSIS — M797 Fibromyalgia: Secondary | ICD-10-CM

## 2022-02-25 DIAGNOSIS — G8929 Other chronic pain: Secondary | ICD-10-CM

## 2022-02-25 DIAGNOSIS — G894 Chronic pain syndrome: Secondary | ICD-10-CM | POA: Diagnosis not present

## 2022-02-25 DIAGNOSIS — K900 Celiac disease: Secondary | ICD-10-CM | POA: Diagnosis not present

## 2022-02-25 DIAGNOSIS — Z8719 Personal history of other diseases of the digestive system: Secondary | ICD-10-CM

## 2022-02-25 DIAGNOSIS — Z8261 Family history of arthritis: Secondary | ICD-10-CM

## 2022-02-25 DIAGNOSIS — M19071 Primary osteoarthritis, right ankle and foot: Secondary | ICD-10-CM | POA: Diagnosis not present

## 2022-02-25 DIAGNOSIS — K224 Dyskinesia of esophagus: Secondary | ICD-10-CM | POA: Diagnosis not present

## 2022-02-25 DIAGNOSIS — F411 Generalized anxiety disorder: Secondary | ICD-10-CM

## 2022-02-25 DIAGNOSIS — M19042 Primary osteoarthritis, left hand: Secondary | ICD-10-CM

## 2022-02-25 MED ORDER — HYDROXYCHLOROQUINE SULFATE 200 MG PO TABS: 200.0000 mg | ORAL_TABLET | Freq: Two times a day (BID) | ORAL | 0 refills | Status: DC

## 2022-02-27 DIAGNOSIS — Z79899 Other long term (current) drug therapy: Secondary | ICD-10-CM | POA: Diagnosis not present

## 2022-02-27 DIAGNOSIS — M3509 Sicca syndrome with other organ involvement: Secondary | ICD-10-CM | POA: Diagnosis not present

## 2022-02-27 NOTE — Progress Notes (Signed)
[place] MD/PA/NP OP Progress Note ? ?03/03/2022 9:41 AM ?Susan [name]  ?MRN:  [id number] ? ?Chief Complaint:  ?Chief Complaint  ?Patient presents with  ? Follow-up  ? ?HPI:  ?This is a follow-up appointment for depression and anxiety.  ?She states that she stayed in [place] for a few months.  She talks about a few friends, who suffers from cancer.  She also one of them a few months ago.  Although it has been hard, and she feels depressed at times, she usually feels better after a few days.  She continues to suffer from joint pain and fatigue.  She also struggles with memory; she talks about forgetfulness where she has lost keys, although she was able to eventually find those.  Although she used to be on Ritalin, it was discontinued due to some adverse reaction.  She continues to have middle insomnia.  Although she uses CPAP machine, it causes dry mouth while she has it at baseline due to Sjegren's syndrome.  She feels anxious at times.  She has been limiting taking Xanax once a day.  She denies panic attacks.  Although she had weight gain, she attributes to having COVID in January.  She denies lingering symptoms from COVID except fatigue and baseline difficulty in concentration.  She denies SI. She denies irritability. She feels comfortable to stay on the current medication regimen at this time.  ? ? ?She lives with her husband of 62 years, no children,  ?On disability ?Work: unemployed.  used to work as Electrical engineer for 29 years ?Education: graduated form high school, no IEP ?She grew up in [place], [place]. She had "two great christian parents."  ? ?Wt Readings from Last 3 Encounters:  ?03/03/22 194 lb 3.2 oz (88.1 kg)  ?02/25/22 195 lb 3.2 oz (88.5 kg)  ?10/18/21 191 lb (86.6 kg)  ?  ? ?Visit Diagnosis:  ?  ICD-10-CM   ?1. MDD (major depressive disorder), recurrent, in partial remission (HCC)  F33.41 venlafaxine XR (EFFEXOR-XR) 150 MG 24 hr capsule  ?  amitriptyline (ELAVIL) 25 MG tablet  ?  ?2. Generalized  anxiety disorder  F41.1 venlafaxine XR (EFFEXOR-XR) 150 MG 24 hr capsule  ?  amitriptyline (ELAVIL) 25 MG tablet  ?  ? ? ?Past Psychiatric History: Please see initial evaluation for full details. I have reviewed the history. No updates at this time.  ?  ? ?Past Medical History:  ?Past Medical History:  ?Diagnosis Date  ? Arthritis   ? Asthma   ? Asthmatic bronchitis   ? BRCA negative 05/2014  ? MyRisk neg  ? Celiac disease   ? Celiac sprue   ? Chronic anxiety   ? Complication of anesthesia   ? vomiting  ? COPD (chronic obstructive pulmonary disease) ([place])   ? Depression   ? Environmental allergies   ? Family history of breast cancer 05/2014  ? MyRisk neg; IBIS=29.5%  ? Fibromyalgia   ? neuropathy all over  ? Gastritis 10/08/2013  ? GERD (gastroesophageal reflux disease)   ? Hyperlipemia   ? Hyperlipidemia   ? Intolerant to statins  ? Hypothyroidism (acquired)   ? Increased risk of breast cancer 05/2014  ? IBIS=29.5%  ? Increased risk of breast cancer 05/2014  ? IBIS=29.5%  ? Migraine   ? Migraines   ? 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 Pancreatitis   ? Sjogren's disease ([place])   ? Spastic colon   ? Thyroid cancer ([place]) 1990 and 1994  ? Total thyroidectomy  with radioactive iodine.   ?  ?Past Surgical History:  ?Procedure Laterality Date  ? ABDOMINAL HYSTERECTOMY  2006  ? BLADDER REPAIR    ? BREAST CYST ASPIRATION Left yrs ago  ? CHOLECYSTECTOMY  1989  ? COLONOSCOPY N/A 10/16/2021  ? Procedure: COLONOSCOPY;  Surgeon: Robert Bellow, MD;  Location: [facility] ENDOSCOPY;  Service: Endoscopy;  Laterality: N/A;  ? COLONOSCOPY WITH ESOPHAGOGASTRODUODENOSCOPY (EGD)    ? COLONOSCOPY WITH PROPOFOL N/A 08/03/2017  ? Procedure: COLONOSCOPY WITH PROPOFOL;  Surgeon: Lollie Sails, MD;  Location: [facility] ENDOSCOPY;  Service: Endoscopy;  Laterality: N/A;  ? COLONOSCOPY WITH PROPOFOL N/A 10/18/2018  ? Procedure: COLONOSCOPY WITH PROPOFOL;  Surgeon: Lollie Sails, MD;  Location: [facility] ENDOSCOPY;  Service: Endoscopy;  Laterality:  N/A;  ? ESOPHAGOGASTRODUODENOSCOPY N/A 10/07/2013  ? Procedure: ESOPHAGOGASTRODUODENOSCOPY (EGD);  Surgeon: Danie Binder, MD;  Location: [facility] ENDO SUITE;  Service: Endoscopy;  Laterality: N/A;  patient received heparin at 530am ?given phenergan 47m IV 30 minutes before  ? ESOPHAGOGASTRODUODENOSCOPY    ? x4  ? EYE SURGERY    ? lacrimal gland   ? FLEXIBLE BRONCHOSCOPY N/A 06/04/2016  ? Procedure: FLEXIBLE BRONCHOSCOPY;  Surgeon: SAllyne Gee MD;  Location: [facility] ORS;  Service: Pulmonary;  Laterality: N/A;  ? INCONTINENCE SURGERY  2004  ? NASAL SINUS SURGERY    ? THYROID LOBECTOMY    ? TOTAL THYROIDECTOMY    ? ? ?Family Psychiatric History: Please see initial evaluation for full details. I have reviewed the history. No updates at this time.  ?  ? ?Family History:  ?Family History  ?Problem Relation Age of Onset  ? Arthritis/Rheumatoid Mother   ?     died in age 255s ? Breast cancer Mother 442 ? Breast cancer Maternal Aunt   ?     X 2. 50's  ? Breast cancer Maternal Aunt   ? Lung cancer Maternal Aunt   ? Breast cancer Maternal Aunt   ? Breast cancer Maternal Grandmother   ?     40's  ? Pancreatitis Neg Hx   ? Colon cancer Neg Hx   ? ? ?Social History:  ?Social History  ? ?Socioeconomic History  ? Marital status: Married  ?  Spouse name: Not on file  ? Number of children: 0  ? Years of education: Not on file  ? Highest education level: Not on file  ?Occupational History  ? Not on file  ?Tobacco Use  ? Smoking status: Never  ?  Passive exposure: Past  ? Smokeless tobacco: Never  ?Vaping Use  ? Vaping Use: Never used  ?Substance and Sexual Activity  ? Alcohol use: No  ? Drug use: No  ? Sexual activity: Yes  ?  Birth control/protection: Surgical  ?Other Topics Concern  ? Not on file  ?Social History Narrative  ? Not on file  ? ?Social Determinants of Health  ? ?Financial Resource Strain: Low Risk   ? Difficulty of Paying Living Expenses: Not very hard  ?Food Insecurity: Not on file  ?Transportation Needs: Not on file   ?Physical Activity: Not on file  ?Stress: Not on file  ?Social Connections: Not on file  ? ? ?Allergies:  ?Allergies  ?Allergen Reactions  ? Aspirin   ? Keflex [Cephalexin] Shortness Of Breath  ? Azithromycin Hives and Nausea Only  ? Barley Grass   ? Clarithromycin Other (See Comments)  ? Metoclopramide Other (See Comments)  ?  TREMORS ?Tremors ?Tremors ?  ? Oat  Other (See Comments)  ? Oatmeal Other (See Comments)  ? Other   ?  Other reaction(s): Other (See Comments) ?KEOPLEX.  ? Rye Grass Flower Pollen Extract [Gramineae Pollens]   ? Vioxx [Rofecoxib] Diarrhea  ? Wheat Bran Other (See Comments)  ?  Celiac disease  ? Wheat Extract   ? Celebrex [Celecoxib] Hives  ? Metoclopramide Hcl Anxiety  ?  BODY TREMORS  ? ? ?Metabolic Disorder Labs: ?No results found for: HGBA1C, MPG ?No results found for: PROLACTIN ?Lab Results  ?Component Value Date  ? CHOL 188 04/16/2021  ? TRIG 135 04/16/2021  ? HDL 51 04/16/2021  ? CHOLHDL 3.7 04/16/2021  ? VLDL 22 03/23/2020  ? LDLCALC 113 (H) 04/16/2021  ? LDLCALC 118 (H) 03/23/2020  ? ?Lab Results  ?Component Value Date  ? TSH 2.640 09/05/2021  ? TSH 0.056 (L) 04/16/2021  ? ? ?Therapeutic Level Labs: ?No results found for: LITHIUM ?No results found for: VALPROATE ?No components found for:  CBMZ ? ?Current Medications: ?Current Outpatient Medications  ?Medication Sig Dispense Refill  ? acetaminophen (TYLENOL) 500 MG tablet Take by mouth as needed.    ? albuterol (PROVENTIL HFA;VENTOLIN HFA) 108 (90 Base) MCG/ACT inhaler Inhale 2 puffs into the lungs every 6 (six) hours as needed for wheezing or shortness of breath. 1 Inhaler 2  ? albuterol (PROVENTIL) (2.5 MG/3ML) 0.083% nebulizer solution Take 3 mLs (2.5 mg total) by nebulization every 6 (six) hours as needed for wheezing or shortness of breath. 75 mL 6  ? ALPRAZolam (XANAX) 0.5 MG tablet Take 1 tablet (0.5 mg total) by mouth 3 (three) times daily as needed for anxiety. 90 tablet 0  ? Ascorbic Acid (VITAMIN C) 100 MG tablet Take 100 mg  by mouth daily.     ? azelastine (ASTELIN) 0.1 % nasal spray Place 2 sprays into both nostrils 2 (two) times daily. Use in each nostril as directed    ? BREO ELLIPTA 100-25 MCG/INH AEPB 1 PUFF BY MOUTH ONCE

## 2022-03-03 ENCOUNTER — Ambulatory Visit (INDEPENDENT_AMBULATORY_CARE_PROVIDER_SITE_OTHER): Payer: Medicare HMO | Admitting: Psychiatry

## 2022-03-03 ENCOUNTER — Encounter: Payer: Self-pay | Admitting: Psychiatry

## 2022-03-03 DIAGNOSIS — F411 Generalized anxiety disorder: Secondary | ICD-10-CM | POA: Diagnosis not present

## 2022-03-03 DIAGNOSIS — R69 Illness, unspecified: Secondary | ICD-10-CM | POA: Diagnosis not present

## 2022-03-03 DIAGNOSIS — F3341 Major depressive disorder, recurrent, in partial remission: Secondary | ICD-10-CM | POA: Diagnosis not present

## 2022-03-03 MED ORDER — AMITRIPTYLINE HCL 25 MG PO TABS: 25.0000 mg | ORAL_TABLET | Freq: Every day | ORAL | 0 refills | Status: DC

## 2022-03-03 MED ORDER — VENLAFAXINE HCL ER 150 MG PO CP24: 300.0000 mg | ORAL_CAPSULE | Freq: Every day | ORAL | 1 refills | Status: DC

## 2022-03-03 NOTE — Progress Notes (Signed)
CBC and CMP WNL.   ?Ro and La antibodies are negative. ESR WNL.  RF negative.  ?ANA is low, nonspecific titer.  ?UA revealed 1+ leukocytes.  Negative for bacteria and nitrites.  If she develops symptoms of a UTI she should follow up with her Pcp for further evaluation.

## 2022-03-03 NOTE — Patient Instructions (Signed)
Continue venlafaxine 300 mg daily  ?Continue amitriptyline 25 mg at night  ?Next appointment: 8/21 at 8:40,video ?

## 2022-03-04 LAB — URINALYSIS, ROUTINE W REFLEX MICROSCOPIC
Bacteria, UA: NONE SEEN /HPF
Bilirubin Urine: NEGATIVE
Glucose, UA: NEGATIVE
Hgb urine dipstick: NEGATIVE
Hyaline Cast: NONE SEEN /LPF
Ketones, ur: NEGATIVE
Nitrite: NEGATIVE
Protein, ur: NEGATIVE
RBC / HPF: NONE SEEN /HPF (ref 0–2)
Specific Gravity, Urine: 1.015 (ref 1.001–1.035)
pH: 6 (ref 5.0–8.0)

## 2022-03-04 LAB — PROTEIN ELECTROPHORESIS, SERUM, WITH REFLEX
Albumin ELP: 4 g/dL (ref 3.8–4.8)
Alpha 1: 0.3 g/dL (ref 0.2–0.3)
Alpha 2: 0.6 g/dL (ref 0.5–0.9)
Beta 2: 0.4 g/dL (ref 0.2–0.5)
Beta Globulin: 0.5 g/dL (ref 0.4–0.6)
Gamma Globulin: 1.2 g/dL (ref 0.8–1.7)
Total Protein: 7.1 g/dL (ref 6.1–8.1)

## 2022-03-04 LAB — CBC WITH DIFFERENTIAL/PLATELET
Absolute Monocytes: 429 cells/uL (ref 200–950)
Basophils Absolute: 28 cells/uL (ref 0–200)
Basophils Relative: 0.5 %
Eosinophils Absolute: 242 cells/uL (ref 15–500)
Eosinophils Relative: 4.4 %
HCT: 39.3 % (ref 35.0–45.0)
Hemoglobin: 13.2 g/dL (ref 11.7–15.5)
Lymphs Abs: 1892 cells/uL (ref 850–3900)
MCH: 29.9 pg (ref 27.0–33.0)
MCHC: 33.6 g/dL (ref 32.0–36.0)
MCV: 89.1 fL (ref 80.0–100.0)
MPV: 9.6 fL (ref 7.5–12.5)
Monocytes Relative: 7.8 %
Neutro Abs: 2910 cells/uL (ref 1500–7800)
Neutrophils Relative %: 52.9 %
Platelets: 358 10*3/uL (ref 140–400)
RBC: 4.41 10*6/uL (ref 3.80–5.10)
RDW: 12.7 % (ref 11.0–15.0)
Total Lymphocyte: 34.4 %
WBC: 5.5 10*3/uL (ref 3.8–10.8)

## 2022-03-04 LAB — COMPLETE METABOLIC PANEL WITH GFR
AG Ratio: 1.4 (calc) (ref 1.0–2.5)
ALT: 29 U/L (ref 6–29)
AST: 21 U/L (ref 10–35)
Albumin: 4.1 g/dL (ref 3.6–5.1)
Alkaline phosphatase (APISO): 63 U/L (ref 37–153)
BUN: 15 mg/dL (ref 7–25)
CO2: 23 mmol/L (ref 20–32)
Calcium: 9.5 mg/dL (ref 8.6–10.4)
Chloride: 105 mmol/L (ref 98–110)
Creat: 0.92 mg/dL (ref 0.50–1.05)
Globulin: 3 g/dL (calc) (ref 1.9–3.7)
Glucose, Bld: 85 mg/dL (ref 65–99)
Potassium: 4.4 mmol/L (ref 3.5–5.3)
Sodium: 139 mmol/L (ref 135–146)
Total Bilirubin: 0.3 mg/dL (ref 0.2–1.2)
Total Protein: 7.1 g/dL (ref 6.1–8.1)
eGFR: 68 mL/min/{1.73_m2} (ref >=60)

## 2022-03-04 LAB — ANA: Anti Nuclear Antibody (ANA): POSITIVE — AB

## 2022-03-04 LAB — SJOGRENS SYNDROME-A EXTRACTABLE NUCLEAR ANTIBODY: SSA (Ro) (ENA) Antibody, IgG: <1 AI

## 2022-03-04 LAB — SJOGRENS SYNDROME-B EXTRACTABLE NUCLEAR ANTIBODY: SSB (La) (ENA) Antibody, IgG: <1 AI

## 2022-03-04 LAB — C3 AND C4
C3 Complement: 168 mg/dL (ref 83–193)
C4 Complement: 24 mg/dL (ref 15–57)

## 2022-03-04 LAB — MICROSCOPIC MESSAGE

## 2022-03-04 LAB — ANTI-NUCLEAR AB-TITER (ANA TITER): ANA Titer 1: 1:80 {titer} — ABNORMAL HIGH

## 2022-03-04 LAB — RHEUMATOID FACTOR: Rheumatoid fact SerPl-aCnc: <14 IU/mL (ref <14)

## 2022-03-04 LAB — SEDIMENTATION RATE: Sed Rate: 9 mm/h (ref 0–30)

## 2022-03-04 NOTE — Progress Notes (Signed)
Complements WNL.   ?SPEP did not reveal any abnormal proteins.

## 2022-03-06 ENCOUNTER — Other Ambulatory Visit: Payer: Self-pay | Admitting: Nurse Practitioner

## 2022-03-06 DIAGNOSIS — E785 Hyperlipidemia, unspecified: Secondary | ICD-10-CM

## 2022-03-17 ENCOUNTER — Ambulatory Visit: Payer: Medicare HMO | Admitting: Nurse Practitioner

## 2022-03-25 DIAGNOSIS — G4733 Obstructive sleep apnea (adult) (pediatric): Secondary | ICD-10-CM | POA: Diagnosis not present

## 2022-03-26 DIAGNOSIS — M069 Rheumatoid arthritis, unspecified: Secondary | ICD-10-CM | POA: Diagnosis not present

## 2022-03-26 DIAGNOSIS — M545 Low back pain, unspecified: Secondary | ICD-10-CM | POA: Diagnosis not present

## 2022-03-26 DIAGNOSIS — G250 Essential tremor: Secondary | ICD-10-CM | POA: Diagnosis not present

## 2022-03-26 DIAGNOSIS — G43719 Chronic migraine without aura, intractable, without status migrainosus: Secondary | ICD-10-CM | POA: Diagnosis not present

## 2022-04-02 ENCOUNTER — Ambulatory Visit (INDEPENDENT_AMBULATORY_CARE_PROVIDER_SITE_OTHER): Payer: Medicare HMO | Admitting: Nurse Practitioner

## 2022-04-02 ENCOUNTER — Telehealth: Payer: Medicare HMO

## 2022-04-02 ENCOUNTER — Encounter: Payer: Self-pay | Admitting: Nurse Practitioner

## 2022-04-02 VITALS — BP 116/81 | HR 73 | Temp 98.6°F | Resp 16 | Ht 65.0 in | Wt 194.0 lb

## 2022-04-02 DIAGNOSIS — M19042 Primary osteoarthritis, left hand: Secondary | ICD-10-CM

## 2022-04-02 DIAGNOSIS — Z789 Other specified health status: Secondary | ICD-10-CM

## 2022-04-02 DIAGNOSIS — J3089 Other allergic rhinitis: Secondary | ICD-10-CM

## 2022-04-02 DIAGNOSIS — M797 Fibromyalgia: Secondary | ICD-10-CM | POA: Diagnosis not present

## 2022-04-02 DIAGNOSIS — J309 Allergic rhinitis, unspecified: Secondary | ICD-10-CM

## 2022-04-02 DIAGNOSIS — Z760 Encounter for issue of repeat prescription: Secondary | ICD-10-CM

## 2022-04-02 DIAGNOSIS — Z0001 Encounter for general adult medical examination with abnormal findings: Secondary | ICD-10-CM

## 2022-04-02 DIAGNOSIS — G4733 Obstructive sleep apnea (adult) (pediatric): Secondary | ICD-10-CM | POA: Diagnosis not present

## 2022-04-02 DIAGNOSIS — M19041 Primary osteoarthritis, right hand: Secondary | ICD-10-CM | POA: Diagnosis not present

## 2022-04-02 DIAGNOSIS — I739 Peripheral vascular disease, unspecified: Secondary | ICD-10-CM | POA: Diagnosis not present

## 2022-04-02 DIAGNOSIS — E559 Vitamin D deficiency, unspecified: Secondary | ICD-10-CM

## 2022-04-02 DIAGNOSIS — E785 Hyperlipidemia, unspecified: Secondary | ICD-10-CM

## 2022-04-02 DIAGNOSIS — R300 Dysuria: Secondary | ICD-10-CM

## 2022-04-02 DIAGNOSIS — E538 Deficiency of other specified B group vitamins: Secondary | ICD-10-CM

## 2022-04-02 DIAGNOSIS — R0602 Shortness of breath: Secondary | ICD-10-CM

## 2022-04-02 MED ORDER — HYDROCODONE-ACETAMINOPHEN 7.5-325 MG PO TABS: ORAL_TABLET | ORAL | 0 refills | Status: DC

## 2022-04-02 MED ORDER — GABAPENTIN 600 MG PO TABS: ORAL_TABLET | ORAL | 3 refills | Status: DC

## 2022-04-02 MED ORDER — ALBUTEROL SULFATE HFA 108 (90 BASE) MCG/ACT IN AERS: 2.0000 | INHALATION_SPRAY | Freq: Four times a day (QID) | RESPIRATORY_TRACT | 2 refills | Status: DC | PRN

## 2022-04-02 MED ORDER — FLUTICASONE FUROATE-VILANTEROL 100-25 MCG/ACT IN AEPB: 1.0000 | INHALATION_SPRAY | Freq: Every day | RESPIRATORY_TRACT | 3 refills | Status: DC

## 2022-04-02 MED ORDER — "SYRINGE 25G X 5/8"" 3 ML MISC": 3 refills | Status: DC

## 2022-04-02 MED ORDER — ALBUTEROL SULFATE (2.5 MG/3ML) 0.083% IN NEBU: 2.5000 mg | INHALATION_SOLUTION | Freq: Four times a day (QID) | RESPIRATORY_TRACT | 6 refills | Status: DC | PRN

## 2022-04-02 MED ORDER — ALPRAZOLAM 0.5 MG PO TABS: 0.5000 mg | ORAL_TABLET | Freq: Three times a day (TID) | ORAL | 0 refills | Status: DC | PRN

## 2022-04-02 MED ORDER — VITAMIN D (ERGOCALCIFEROL) 1.25 MG (50000 UNIT) PO CAPS: 50000.0000 [IU] | ORAL_CAPSULE | ORAL | 1 refills | Status: DC

## 2022-04-02 MED ORDER — FENOFIBRATE 145 MG PO TABS: 145.0000 mg | ORAL_TABLET | Freq: Every day | ORAL | 1 refills | Status: DC

## 2022-04-02 MED ORDER — FOLIC ACID 1 MG PO TABS: 1.0000 mg | ORAL_TABLET | Freq: Every day | ORAL | 1 refills | Status: DC

## 2022-04-02 MED ORDER — MONTELUKAST SODIUM 10 MG PO TABS: ORAL_TABLET | ORAL | 3 refills | Status: DC

## 2022-04-02 NOTE — Progress Notes (Signed)
[facility], [place] [address]  Internal MEDICINE  Office Visit Note  Patient Name: Susan [name]  [id number]  [id number]  Date of Service: 04/02/2022  Chief Complaint  Patient presents with   Medicare Wellness    Discuss CPAP, pts mouth is getting dry and sometimes sores in mouth   Depression   Gastroesophageal Reflux   Hyperlipidemia   Anxiety   Asthma   COPD    HPI Susan [name] presents for an annual well visit and physical exam. --well appearing 66 yo female with Sjogrens anxiety, arthritis, asthma, thyroid cancer s/p total thyroidectomy, COPD, depression, GERD, hyperlipidemia, fibromyalgia. -- lives at home with husband. She is disabled and not working, was a Government social research officer for [employer]  ---colonoscopy is due in 2029.  --mammogram Dec 2022 was normal. Sees OBGYN for pap, last one done dec 2022 was ASCUS and will repeat next year.  --DEXA scan done in June 2022, result was normal. -arthritis is manageable -asthma is stable, no significant change  -depression in remission and anxiety is controlled, takes venlafaxine and amitriptyline. --taking ezetimibe and fenofibrate, need repeat labs --labs drawn in may w/rheumatology, CBC and CMP are normal -takes 137 mcg levothyroxine., need repeat TSH and free T4 -take 50,000 units vitamin D weekly, need repeat lab       Current Medication: Outpatient Encounter Medications as of 04/02/2022  Medication Sig   acetaminophen (TYLENOL) 500 MG tablet Take by mouth as needed.   amitriptyline (ELAVIL) 25 MG tablet Take 1 tablet (25 mg total) by mouth at bedtime.   Ascorbic Acid (VITAMIN C) 100 MG tablet Take 100 mg by mouth daily.    azelastine (ASTELIN) 0.1 % nasal spray Place 2 sprays into both nostrils 2 (two) times daily. Use in each nostril as directed   carisoprodol (SOMA) 350 MG tablet Take 350 mg by mouth at bedtime. Patient states that she takes 1/2 tablet nightly    Dentifrices (BIOTENE DRY MOUTH  DT) by Transmucosal route.   eletriptan (RELPAX) 40 MG tablet Take 40 mg by mouth as needed for migraine. may repeat in 2 hours if necessary    EPINEPHRINE 0.3 mg/0.3 mL IJ SOAJ injection INJECT CONTENTS OF 1 PEN AS NEEDED FOR ALLERGIC REACTION   estradiol (CLIMARA) 0.1 mg/24hr patch APPLY 1 PATCH TO SKIN ONCE WEEKLY   ezetimibe (ZETIA) 10 MG tablet Take 1 tablet by mouth once daily   fluticasone (FLONASE) 50 MCG/ACT nasal spray Place 2 sprays into both nostrils daily.   fluticasone furoate-vilanterol (BREO ELLIPTA) 100-25 MCG/ACT AEPB Inhale 1 puff into the lungs daily.   hyoscyamine (ANASPAZ) 0.125 MG TBDP disintergrating tablet Place 0.125 mg under the tongue every 4 (four) hours as needed for bladder spasms.    Lactobacillus (DIGESTIVE HEALTH PROBIOTIC PO) Take 1 tablet by mouth daily.    LINZESS 290 MCG CAPS capsule Take 1 capsule (290 mcg total) by mouth daily.   Magnesium Oxide (NATRUL MAGNESIUM PO) Take by mouth.    Multiple Vitamins-Minerals (ZINC PO) Take by mouth daily.   mupirocin ointment (BACTROBAN) 2 % as needed.   pantoprazole (PROTONIX) 40 MG tablet Take 40 mg by mouth daily.    Polyethyl Glycol-Propyl Glycol 0.4-0.3 % SOLN Apply to eye.    PRESCRIPTION MEDICATION 1 Syringe by Subdermal route once a week. Allergy shot    Probiotic Product ([place]) CAPS Take by mouth.    sodium chloride (OCEAN) 0.65 % SOLN nasal spray Place 1 spray into both nostrils  as needed for congestion.    sodium chloride 0.9 % nebulizer solution    SUMAtriptan-naproxen (TREXIMET) 85-500 MG tablet daily as needed.    Tuberculin-Allergy Syringes 28G X 1/2" 1 ML MISC 1 each by Does not apply route every 14 (fourteen) days. For allergy injections   venlafaxine XR (EFFEXOR-XR) 150 MG 24 hr capsule Take 2 capsules (300 mg total) by mouth at bedtime.   vitamin E 1000 UNIT capsule Take by mouth.    [DISCONTINUED] albuterol (PROVENTIL HFA;VENTOLIN HFA) 108 (90 Base) MCG/ACT inhaler Inhale 2 puffs  into the lungs every 6 (six) hours as needed for wheezing or shortness of breath.   [DISCONTINUED] albuterol (PROVENTIL) (2.5 MG/3ML) 0.083% nebulizer solution Take 3 mLs (2.5 mg total) by nebulization every 6 (six) hours as needed for wheezing or shortness of breath.   [DISCONTINUED] ALPRAZolam (XANAX) 0.5 MG tablet Take 1 tablet (0.5 mg total) by mouth 3 (three) times daily as needed for anxiety.   [DISCONTINUED] BREO ELLIPTA 100-25 MCG/INH AEPB 1 PUFF BY MOUTH ONCE DAILY   [DISCONTINUED] Estradiol (YUVAFEM) 10 MCG TABS vaginal tablet Place 1 tablet (10 mcg total) vaginally 2 (two) times a week.   [DISCONTINUED] fenofibrate (TRICOR) 145 MG tablet Take 1 tablet (145 mg total) by mouth daily.   [DISCONTINUED] folic acid (FOLVITE) 1 MG tablet Take 1 tablet (1 mg total) by mouth daily.   [DISCONTINUED] gabapentin (NEURONTIN) 600 MG tablet Take 1/2 (one-half) tablet by mouth twice daily   [DISCONTINUED] HYDROcodone-acetaminophen (NORCO) 7.5-325 MG tablet One tab po tid for pain prn only   [DISCONTINUED] hydroxychloroquine (PLAQUENIL) 200 MG tablet Take 1 tablet (200 mg total) by mouth 2 (two) times daily.   [DISCONTINUED] levothyroxine (SYNTHROID) 125 MCG tablet Take 1 tablet (125 mcg total) by mouth daily.   [DISCONTINUED] montelukast (SINGULAIR) 10 MG tablet TAKE ONE TABLET BY MOUTH ONCE DAILY FOR ASTHMA   [DISCONTINUED] Syringe/Needle, Disp, (SYRINGE 3CC/25GX5/8") 25G X 5/8" 3 ML MISC Use as directed once a month   [DISCONTINUED] Vitamin D, Ergocalciferol, (DRISDOL) 1.25 MG (50000 UNIT) CAPS capsule Take 1 capsule (50,000 Units total) by mouth every 7 (seven) days. Monday   albuterol (PROVENTIL) (2.5 MG/3ML) 0.083% nebulizer solution Take 3 mLs (2.5 mg total) by nebulization every 6 (six) hours as needed for wheezing or shortness of breath.   albuterol (VENTOLIN HFA) 108 (90 Base) MCG/ACT inhaler Inhale 2 puffs into the lungs every 6 (six) hours as needed for wheezing or shortness of breath.    ALPRAZolam (XANAX) 0.5 MG tablet Take 1 tablet (0.5 mg total) by mouth 3 (three) times daily as needed for anxiety.   fenofibrate (TRICOR) 145 MG tablet Take 1 tablet (145 mg total) by mouth daily.   folic acid (FOLVITE) 1 MG tablet Take 1 tablet (1 mg total) by mouth daily.   gabapentin (NEURONTIN) 600 MG tablet Take 1/2 (one-half) tablet by mouth twice daily   HYDROcodone-acetaminophen (NORCO) 7.5-325 MG tablet One tab po tid for pain prn only   montelukast (SINGULAIR) 10 MG tablet TAKE ONE TABLET BY MOUTH ONCE DAILY FOR ASTHMA   Vitamin D, Ergocalciferol, (DRISDOL) 1.25 MG (50000 UNIT) CAPS capsule Take 1 capsule (50,000 Units total) by mouth every 7 (seven) days. Monday   [DISCONTINUED] chlorpheniramine-HYDROcodone (TUSSIONEX PENNKINETIC ER) 10-8 MG/5ML Take 5 mLs by mouth every 12 (twelve) hours as needed for cough.   [DISCONTINUED] famotidine (PEPCID) 10 MG tablet Take 20 mg by mouth.    [DISCONTINUED] predniSONE (DELTASONE) 10 MG tablet Take one tab 3  x day for 3 days, then take one tab 2 x a day for 3 days and then take one tab a day for 3 days for URI   [DISCONTINUED] Syringe/Needle, Disp, (SYRINGE 3CC/25GX5/8") 25G X 5/8" 3 ML MISC Use as directed once a month   [DISCONTINUED] White Petrolatum-Mineral Oil ([place] PETROL-MINERAL OIL-LANOLIN) 0.1-0.1 % OINT    [DISCONTINUED] cyanocobalamin ((VITAMIN B-12)) injection 1,000 mcg    No facility-administered encounter medications on file as of 04/02/2022.    Surgical History: Past Surgical History:  Procedure Laterality Date   ABDOMINAL HYSTERECTOMY  2006   BLADDER REPAIR     BREAST CYST ASPIRATION Left yrs ago   [place]   COLONOSCOPY N/A 10/16/2021   Procedure: COLONOSCOPY;  Surgeon: Robert Bellow, MD;  Location: [facility] ENDOSCOPY;  Service: Endoscopy;  Laterality: N/A;   COLONOSCOPY WITH ESOPHAGOGASTRODUODENOSCOPY (EGD)     COLONOSCOPY WITH PROPOFOL N/A 08/03/2017   Procedure: COLONOSCOPY WITH PROPOFOL;  Surgeon: Lollie Sails, MD;  Location: [facility] ENDOSCOPY;  Service: Endoscopy;  Laterality: N/A;   COLONOSCOPY WITH PROPOFOL N/A 10/18/2018   Procedure: COLONOSCOPY WITH PROPOFOL;  Surgeon: Lollie Sails, MD;  Location: [facility] ENDOSCOPY;  Service: Endoscopy;  Laterality: N/A;   ESOPHAGOGASTRODUODENOSCOPY N/A 10/07/2013   Procedure: ESOPHAGOGASTRODUODENOSCOPY (EGD);  Surgeon: Danie Binder, MD;  Location: [facility] ENDO SUITE;  Service: Endoscopy;  Laterality: N/A;  patient received heparin at 530am given phenergan 49m IV 30 minutes before   ESOPHAGOGASTRODUODENOSCOPY     x4   EYE SURGERY     lacrimal gland    FLEXIBLE BRONCHOSCOPY N/A 06/04/2016   Procedure: FLEXIBLE BRONCHOSCOPY;  Surgeon: SAllyne Gee MD;  Location: [facility] ORS;  Service: Pulmonary;  Laterality: N/A;   INCONTINENCE SURGERY  2004   NASAL SINUS SURGERY     THYROID LOBECTOMY     TOTAL THYROIDECTOMY      Medical History: Past Medical History:  Diagnosis Date   Arthritis    Asthma    Asthmatic bronchitis    BRCA negative 05/2014   MyRisk neg   Celiac disease    Celiac sprue    Chronic anxiety    Complication of anesthesia    vomiting   COPD (chronic obstructive pulmonary disease) ([place]    Depression    Environmental allergies    Family history of breast cancer 05/2014   MyRisk neg; IBIS=29.5%   Fibromyalgia    neuropathy all over   Gastritis 10/08/2013   GERD (gastroesophageal reflux disease)    Hyperlipemia    Hyperlipidemia    Intolerant to statins   Hypothyroidism (acquired)    Increased risk of breast cancer 05/2014   IBIS=29.5%   Increased risk of breast cancer 05/2014   IBIS=29.5%   Migraine    Migraines    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     Family History: Family History  Problem Relation Age of Onset   Arthritis/Rheumatoid Mother        died in age 1255s   Breast cancer Mother 450  Breast cancer Maternal Aunt        X 2. 50's   Breast cancer Maternal Aunt    Lung cancer Maternal Aunt    Breast cancer Maternal Aunt    Breast cancer Maternal Grandmother        420's  Pancreatitis Neg Hx    Colon cancer Neg Hx     Social History   Socioeconomic History   Marital status: Married    Spouse name: Not on file   Number of children: 0   Years of education: Not on file   Highest education level: Not on file  Occupational History   Not on file  Tobacco Use   Smoking status: Never    Passive exposure: Past   Smokeless tobacco: Never  Vaping Use   Vaping Use: Never used  Substance and Sexual Activity   Alcohol use: No   Drug use: No   Sexual activity: Yes    Birth control/protection: Surgical  Other Topics Concern   Not on file  Social History Narrative   Not on file   Social Determinants of Health   Financial Resource Strain: Low Risk  (03/07/2021)   Overall Financial Resource Strain (CARDIA)    Difficulty of Paying Living Expenses: Not very hard  Food Insecurity: Not on file  Transportation Needs: Not on file  Physical Activity: Not on file  Stress: Not on file  Social Connections: Not on file  Intimate Partner Violence: Not on file      Review of Systems  Constitutional:  Negative for activity change, appetite change, chills, fatigue, fever and unexpected weight change.  HENT: Negative.  Negative for congestion, ear pain, rhinorrhea, sore throat and trouble swallowing.   Eyes: Negative.   Respiratory: Negative.  Negative for cough, chest tightness, shortness of breath and wheezing.   Cardiovascular: Negative.  Negative for chest pain.  Gastrointestinal: Negative.  Negative for abdominal pain, blood in stool, constipation, diarrhea, nausea and vomiting.  Endocrine: Negative.   Genitourinary: Negative.  Negative for difficulty urinating, dysuria, frequency, hematuria and urgency.  Musculoskeletal: Negative.  Negative  for arthralgias, back pain, joint swelling, myalgias and neck pain.  Skin: Negative.  Negative for rash and wound.  Allergic/Immunologic: Negative.  Negative for immunocompromised state.  Neurological: Negative.  Negative for dizziness, seizures, numbness and headaches.  Hematological: Negative.   Psychiatric/Behavioral:  Positive for depression. Negative for behavioral problems, self-injury and suicidal ideas. The patient is not nervous/anxious.     Vital Signs: BP 116/81   Pulse 73   Temp 98.6 F (37 C)   Resp 16   Ht _0  (1.651 m)   Wt 194 lb (88 kg)   SpO2 98%   BMI 32.28 kg/m    Physical Exam Vitals reviewed.  Constitutional:      General: She is awake. She is not in acute distress.    Appearance: Normal appearance. She is well-developed and well-groomed. She is obese. She is not ill-appearing or diaphoretic.  HENT:     Head: Normocephalic and atraumatic.     Right Ear: Tympanic membrane, ear canal and external ear normal.     Left Ear: Tympanic membrane, ear canal and external ear normal.     Nose: Nose normal. No congestion or rhinorrhea.     Mouth/Throat:     Mouth: Mucous membranes are moist.     Pharynx: Oropharynx is clear. No oropharyngeal exudate or posterior oropharyngeal erythema.  Eyes:     General: Lids are normal. Vision grossly intact. Gaze aligned appropriately. No scleral icterus.       Right eye: No discharge.        Left eye: No discharge.     Extraocular Movements: Extraocular movements intact.     Conjunctiva/sclera: Conjunctivae normal.     Pupils:  Pupils are equal, round, and reactive to light.     Funduscopic exam:    Right eye: Red reflex present.        Left eye: Red reflex present. Neck:     Thyroid: No thyromegaly.     Vascular: No JVD.     Trachea: Trachea and phonation normal. No tracheal deviation.  Cardiovascular:     Rate and Rhythm: Normal rate and regular rhythm.     Heart sounds: Normal heart sounds, S1 normal and S2 normal.  No murmur heard.    No friction rub. No gallop.  Pulmonary:     Effort: Pulmonary effort is normal. No accessory muscle usage or respiratory distress.     Breath sounds: Normal breath sounds and air entry. No stridor. No wheezing or rales.  Chest:     Chest wall: No tenderness.  Abdominal:     General: Bowel sounds are normal. There is no distension.     Palpations: Abdomen is soft. There is no mass.     Tenderness: There is no abdominal tenderness. There is no guarding or rebound.  Musculoskeletal:        General: No tenderness or deformity. Normal range of motion.     Cervical back: Normal range of motion and neck supple.     Right lower leg: No edema.     Left lower leg: No edema.  Lymphadenopathy:     Cervical: No cervical adenopathy.  Skin:    General: Skin is warm and dry.     Capillary Refill: Capillary refill takes less than 2 seconds.     Coloration: Skin is not pale.     Findings: No erythema or rash.  Neurological:     Mental Status: She is alert and oriented to person, place, and time.     Cranial Nerves: No cranial nerve deficit.     Motor: No abnormal muscle tone.     Coordination: Coordination normal.     Gait: Gait normal.     Deep Tendon Reflexes: Reflexes are normal and symmetric.  Psychiatric:        Mood and Affect: Mood normal.        Behavior: Behavior normal. Behavior is cooperative.        Thought Content: Thought content normal.        Judgment: Judgment normal.        Assessment/Plan: 1. Encounter for general adult medical examination with abnormal findings Age-appropriate preventive screenings and vaccinations discussed, annual physical exam completed. Routine labs for health maintenance not ordered. Most labs are up to date. Will order lipid panel, vitamin D level, B12, folate and thyroid levels at her follow up visit in December. PHM updated.   2. OSA (obstructive sleep apnea) Patient is interested in having the procedure done to have the  Inspire device placed for OSA, does not tolerated CPAP machine and mask. Has tried different masks.  - Ambulatory referral to ENT  3. Intolerance of continuous positive airway pressure (CPAP) ventilation See problem #2 - Ambulatory referral to ENT  4. Claudication of upper extremity (HCC) ABI was normal, encouraged patient to call clinic if symptoms change or worsen - POCT ABI Screening Pilot No Charge  5. Fibromyalgia Continue gabapentin as prescribed - gabapentin (NEURONTIN) 600 MG tablet; Take 1/2 (one-half) tablet by mouth twice daily  Dispense: 45 tablet; Refill: 3  6. Primary osteoarthritis of both hands Takes norco only when pain is severe, refill ordered - HYDROcodone-acetaminophen (NORCO) 7.5-325 MG tablet;  One tab po tid for pain prn only  Dispense: 30 tablet; Refill: 0  7. Dysuria Routine urinalysis - UA/M w/rflx Culture, Routine - Microscopic Examination  8. Medication refill - Vitamin D, Ergocalciferol, (DRISDOL) 1.25 MG (50000 UNIT) CAPS capsule; Take 1 capsule (50,000 Units total) by mouth every 7 (seven) days. Monday  Dispense: 12 capsule; Refill: 1 - albuterol (PROVENTIL) (2.5 MG/3ML) 0.083% nebulizer solution; Take 3 mLs (2.5 mg total) by nebulization every 6 (six) hours as needed for wheezing or shortness of breath.  Dispense: 75 mL; Refill: 6 - ALPRAZolam (XANAX) 0.5 MG tablet; Take 1 tablet (0.5 mg total) by mouth 3 (three) times daily as needed for anxiety.  Dispense: 90 tablet; Refill: 0 - folic acid (FOLVITE) 1 MG tablet; Take 1 tablet (1 mg total) by mouth daily.  Dispense: 90 tablet; Refill: 1 - montelukast (SINGULAIR) 10 MG tablet; TAKE ONE TABLET BY MOUTH ONCE DAILY FOR ASTHMA  Dispense: 90 tablet; Refill: 3 - gabapentin (NEURONTIN) 600 MG tablet; Take 1/2 (one-half) tablet by mouth twice daily  Dispense: 45 tablet; Refill: 3 - HYDROcodone-acetaminophen (NORCO) 7.5-325 MG tablet; One tab po tid for pain prn only  Dispense: 30 tablet; Refill: 0 - fenofibrate  (TRICOR) 145 MG tablet; Take 1 tablet (145 mg total) by mouth daily.  Dispense: 90 tablet; Refill: 1 - albuterol (VENTOLIN HFA) 108 (90 Base) MCG/ACT inhaler; Inhale 2 puffs into the lungs every 6 (six) hours as needed for wheezing or shortness of breath.  Dispense: 1 each; Refill: 2 - fluticasone furoate-vilanterol (BREO ELLIPTA) 100-25 MCG/ACT AEPB; Inhale 1 puff into the lungs daily.  Dispense: 180 each; Refill: 3      General Counseling: Blayke verbalizes understanding of the findings of todays visit and agrees with plan of treatment. I have discussed any further diagnostic evaluation that may be needed or ordered today. We also reviewed her medications today. she has been encouraged to call the office with any questions or concerns that should arise related to todays visit.    Orders Placed This Encounter  Procedures   Microscopic Examination   UA/M w/rflx Culture, Routine   Ambulatory referral to ENT   POCT ABI Screening Pilot No Charge    Meds ordered this encounter  Medications   Vitamin D, Ergocalciferol, (DRISDOL) 1.25 MG (50000 UNIT) CAPS capsule    Sig: Take 1 capsule (50,000 Units total) by mouth every 7 (seven) days. Monday    Dispense:  12 capsule    Refill:  1   albuterol (PROVENTIL) (2.5 MG/3ML) 0.083% nebulizer solution    Sig: Take 3 mLs (2.5 mg total) by nebulization every 6 (six) hours as needed for wheezing or shortness of breath.    Dispense:  75 mL    Refill:  6   ALPRAZolam (XANAX) 0.5 MG tablet    Sig: Take 1 tablet (0.5 mg total) by mouth 3 (three) times daily as needed for anxiety.    Dispense:  90 tablet    Refill:  0   folic acid (FOLVITE) 1 MG tablet    Sig: Take 1 tablet (1 mg total) by mouth daily.    Dispense:  90 tablet    Refill:  1   DISCONTD: Syringe/Needle, Disp, (SYRINGE 3CC/25GX5/8") 25G X 5/8" 3 ML MISC    Sig: Use as directed once a month    Dispense:  1 each    Refill:  3   montelukast (SINGULAIR) 10 MG tablet    Sig: TAKE ONE  TABLET BY  MOUTH ONCE DAILY FOR ASTHMA    Dispense:  90 tablet    Refill:  3    Please consider 90 day supplies to promote better adherence   gabapentin (NEURONTIN) 600 MG tablet    Sig: Take 1/2 (one-half) tablet by mouth twice daily    Dispense:  45 tablet    Refill:  3   HYDROcodone-acetaminophen (NORCO) 7.5-325 MG tablet    Sig: One tab po tid for pain prn only    Dispense:  30 tablet    Refill:  0   fenofibrate (TRICOR) 145 MG tablet    Sig: Take 1 tablet (145 mg total) by mouth daily.    Dispense:  90 tablet    Refill:  1   albuterol (VENTOLIN HFA) 108 (90 Base) MCG/ACT inhaler    Sig: Inhale 2 puffs into the lungs every 6 (six) hours as needed for wheezing or shortness of breath.    Dispense:  1 each    Refill:  2   fluticasone furoate-vilanterol (BREO ELLIPTA) 100-25 MCG/ACT AEPB    Sig: Inhale 1 puff into the lungs daily.    Dispense:  180 each    Refill:  3    Return in about 6 months (around 10/02/2022) for F/U, Labs, med refill, Charlee Squibb PCP.   Total time spent:30 Minutes Time spent includes review of chart, medications, test results, and follow up plan with the patient.   [place] Controlled Substance Database was reviewed by me.  This patient was seen by [name], FNP-C in collaboration with Dr. Clayborn Bigness as a part of collaborative care agreement.  Winda Summerall R. Valetta Fuller, MSN, FNP-C Internal medicine

## 2022-04-03 ENCOUNTER — Other Ambulatory Visit: Payer: Self-pay | Admitting: Nurse Practitioner

## 2022-04-03 DIAGNOSIS — E559 Vitamin D deficiency, unspecified: Secondary | ICD-10-CM

## 2022-04-03 LAB — UA/M W/RFLX CULTURE, ROUTINE
Bilirubin, UA: NEGATIVE
Glucose, UA: NEGATIVE
Ketones, UA: NEGATIVE
Leukocytes,UA: NEGATIVE
Nitrite, UA: NEGATIVE
Protein,UA: NEGATIVE
RBC, UA: NEGATIVE
Specific Gravity, UA: 1.012 (ref 1.005–1.030)
Urobilinogen, Ur: 0.2 mg/dL (ref 0.2–1.0)
pH, UA: 5.5 (ref 5.0–7.5)

## 2022-04-03 LAB — MICROSCOPIC EXAMINATION
Bacteria, UA: NONE SEEN
Casts: NONE SEEN /lpf
Epithelial Cells (non renal): NONE SEEN /hpf (ref 0–10)
RBC, Urine: NONE SEEN /hpf (ref 0–2)

## 2022-04-04 ENCOUNTER — Telehealth: Payer: Self-pay

## 2022-04-04 NOTE — Telephone Encounter (Signed)
ENT referral faxed to Dr. Melida Quitter w/ [facility]

## 2022-04-16 ENCOUNTER — Other Ambulatory Visit: Payer: Self-pay

## 2022-04-16 DIAGNOSIS — E039 Hypothyroidism, unspecified: Secondary | ICD-10-CM

## 2022-04-16 MED ORDER — CYANOCOBALAMIN 1000 MCG/ML IJ SOLN: 1000.0000 ug | INTRAMUSCULAR | 3 refills | Status: DC

## 2022-04-16 MED ORDER — "SYRINGE 25G X 5/8"" 3 ML MISC": 3 refills | Status: DC

## 2022-04-16 MED ORDER — LEVOTHYROXINE SODIUM 125 MCG PO TABS: 125.0000 ug | ORAL_TABLET | Freq: Every day | ORAL | 3 refills | Status: DC

## 2022-04-16 NOTE — Telephone Encounter (Signed)
Pt called that she spoke with alyssa last visit that she can continue her B 12 injection and as per alyssa send pres for syringes and B12 injection and levothyroxine

## 2022-04-21 ENCOUNTER — Other Ambulatory Visit: Payer: Self-pay

## 2022-04-21 DIAGNOSIS — E785 Hyperlipidemia, unspecified: Secondary | ICD-10-CM

## 2022-04-21 MED ORDER — ESTRADIOL 10 MCG VA TABS: 1.0000 | ORAL_TABLET | VAGINAL | 3 refills | Status: DC

## 2022-04-23 NOTE — Telephone Encounter (Signed)
Per Martie Lee with ENT, patient will schedule appointment when she returns from [place] in [facility]

## 2022-05-01 ENCOUNTER — Other Ambulatory Visit (HOSPITAL_COMMUNITY): Payer: Self-pay | Admitting: Psychiatry

## 2022-05-01 DIAGNOSIS — F3341 Major depressive disorder, recurrent, in partial remission: Secondary | ICD-10-CM

## 2022-05-01 DIAGNOSIS — F411 Generalized anxiety disorder: Secondary | ICD-10-CM

## 2022-05-19 ENCOUNTER — Other Ambulatory Visit: Payer: Self-pay | Admitting: Physician Assistant

## 2022-05-19 DIAGNOSIS — M3509 Sicca syndrome with other organ involvement: Secondary | ICD-10-CM

## 2022-05-19 NOTE — Telephone Encounter (Signed)
Next Visit: 09/30/2022  Last Visit: 02/25/2022  Labs: 02/27/2022 CBC and CMP WNL.   [place] antibodies are negative. ESR WNL.  RF negative.  ANA is low, nonspecific titer.  UA revealed 1+ leukocytes.  Negative for bacteria and nitrites.  Eye exam: 09/30/2021   Current Dose per office note on 02/25/2022: Plaquenil 200 mg 1 tablet by mouth twice daily  XT:[id number] syndrome with other organ involvement  Last Fill: 02/25/2022  Okay to refill Plaquenil?

## 2022-05-30 DIAGNOSIS — H524 Presbyopia: Secondary | ICD-10-CM | POA: Diagnosis not present

## 2022-06-04 ENCOUNTER — Encounter: Payer: Self-pay | Admitting: Nurse Practitioner

## 2022-06-11 NOTE — Progress Notes (Deleted)
BH MD/PA/NP OP Progress Note  06/11/2022 3:59 PM KEENAN DIMITROV  MRN:  [id number]  Chief Complaint: No chief complaint on file.  HPI: *** Visit Diagnosis: No diagnosis found.  Past Psychiatric History: Please see initial evaluation for full details. I have reviewed the history. No updates at this time.     Past Medical History:  Past Medical History:  Diagnosis Date   Arthritis    Asthma    Asthmatic bronchitis    BRCA negative 05/2014   MyRisk neg   Celiac disease    Celiac sprue    Chronic anxiety    Complication of anesthesia    vomiting   COPD (chronic obstructive pulmonary disease) ([place])    Depression    Environmental allergies    Family history of breast cancer 05/2014   MyRisk neg; IBIS=29.5%   Fibromyalgia    neuropathy all over   Gastritis 10/08/2013   GERD (gastroesophageal reflux disease)    Hyperlipemia    Hyperlipidemia    Intolerant to statins   Hypothyroidism (acquired)    Increased risk of breast cancer 05/2014   IBIS=29.5%   Increased risk of breast cancer 05/2014   IBIS=29.5%   Migraine    Migraines    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     Past Surgical History:  Procedure Laterality Date   ABDOMINAL HYSTERECTOMY  2006   BLADDER REPAIR     BREAST CYST ASPIRATION Left yrs ago   [place]   COLONOSCOPY N/A 10/16/2021   Procedure: COLONOSCOPY;  Surgeon: Robert Bellow, MD;  Location: [facility] ENDOSCOPY;  Service: Endoscopy;  Laterality: N/A;   COLONOSCOPY WITH ESOPHAGOGASTRODUODENOSCOPY (EGD)     COLONOSCOPY WITH PROPOFOL N/A 08/03/2017   Procedure: COLONOSCOPY WITH PROPOFOL;  Surgeon: Lollie Sails, MD;  Location: [facility] ENDOSCOPY;  Service: Endoscopy;  Laterality: N/A;   COLONOSCOPY WITH PROPOFOL N/A 10/18/2018   Procedure: COLONOSCOPY WITH PROPOFOL;  Surgeon: Lollie Sails,  MD;  Location[facility] ENDOSCOPY;  Service: Endoscopy;  Laterality: N/A;   ESOPHAGOGASTRODUODENOSCOPY N/A 10/07/2013   Procedure: ESOPHAGOGASTRODUODENOSCOPY (EGD);  Surgeon: Danie Binder, MD;  Location: [facility] ENDO SU[facility];  Service: Endoscopy;  Laterality: N/A;  patient received heparin at 530am given phenergan 71m IV 30 minutes before   ESOPHAGOGASTRODUODENOSCOPY     x4   EYE SURGERY     lacrimal gland    FLEXIBLE BRONCHOSCOPY N/A 06/04/2016   Procedure: FLEXIBLE BRONCHOSCOPY;  Surgeon: SAllyne Gee MD;  Location: [facility] ORS;  Service: Pulmonary;  Laterality: N/A;   INCONTINENCE SURGERY  2004   NASAL SINUS SURGERY     THYROID LOBECTOMY     TOTAL THYROIDECTOMY      Family Psychiatric History: Please see initial evaluation for full details. I have reviewed the history. No updates at this time.     Family History:  Family History  Problem Relation Age of Onset   Arthritis/Rheumatoid Mother        died in age 6738s  Breast cancer Mother 445  Breast cancer Maternal Aunt        X 2. 50's   Breast cancer Maternal Aunt    Lung cancer Maternal Aunt    Breast cancer Maternal Aunt    Breast cancer Maternal Grandmother        40's   Pancreatitis Neg  Hx    Colon cancer Neg Hx     Social History:  Social History   Socioeconomic History   Marital status: Married    Spouse name: Not on file   Number of children: 0   Years of education: Not on file   Highest education level: Not on file  Occupational History   Not on file  Tobacco Use   Smoking status: Never    Passive exposure: Past   Smokeless tobacco: Never  Vaping Use   Vaping Use: Never used  Substance and Sexual Activity   Alcohol use: No   Drug use: No   Sexual activity: Yes    Birth control/protection: Surgical  Other Topics Concern   Not on file  Social History Narrative   Not on file   Social Determinants of Health   Financial Resource Strain: Low Risk  (03/07/2021)   Overall Financial Resource Strain (CARDIA)     Difficulty of Paying Living Expenses: Not very hard  Food Insecurity: Not on file  Transportation Needs: Not on file  Physical Activity: Not on file  Stress: Not on file  Social Connections: Not on file    Allergies:  Allergies  Allergen Reactions   Aspirin    Keflex [Cephalexin] Shortness Of Breath   Azithromycin Hives and Nausea Only   Barley Grass    Clarithromycin Other (See Comments)   Metoclopramide Other (See Comments)    TREMORS Tremors Tremors    Oat Other (See Comments)   Oatmeal Other (See Comments)   Other     Other reaction(s): Other (See Comments) KEOPLEX.   Rye Grass Flower Pollen Extract [Gramineae Pollens]    Vioxx [Rofecoxib] Diarrhea   Wheat Bran Other (See Comments)    Celiac disease   Wheat Extract    Celebrex [Celecoxib] Hives   Metoclopramide Hcl Anxiety    BODY TREMORS    Metabolic Disorder Labs: No results found for: "HGBA1C", "MPG" No results found for: "PROLACTIN" Lab Results  Component Value Date   CHOL 188 04/16/2021   TRIG 135 04/16/2021   HDL 51 04/16/2021   CHOLHDL 3.7 04/16/2021   VLDL 22 03/23/2020   LDLCALC 113 (H) 04/16/2021   LDLCALC 118 (H) 03/23/2020   Lab Results  Component Value Date   TSH 2.640 09/05/2021   TSH 0.056 (L) 04/16/2021    Therapeutic Level Labs: No results found for: "LITHIUM" No results found for: "VALPROATE" No results found for: "CBMZ"  Current Medications: Current Outpatient Medications  Medication Sig Dispense Refill   acetaminophen (TYLENOL) 500 MG tablet Take by mouth as needed.     albuterol (PROVENTIL) (2.5 MG/3ML) 0.083% nebulizer solution Take 3 mLs (2.5 mg total) by nebulization every 6 (six) hours as needed for wheezing or shortness of breath. 75 mL 6   albuterol (VENTOLIN HFA) 108 (90 Base) MCG/ACT inhaler Inhale 2 puffs into the lungs every 6 (six) hours as needed for wheezing or shortness of breath. 1 each 2   ALPRAZolam (XANAX) 0.5 MG tablet Take 1 tablet (0.5 mg total) by mouth 3  (three) times daily as needed for anxiety. 90 tablet 0   amitriptyline (ELAVIL) 25 MG tablet Take 1 tablet (25 mg total) by mouth at bedtime. 90 tablet 0   Ascorbic Acid (VITAMIN C) 100 MG tablet Take 100 mg by mouth daily.      azelastine (ASTELIN) 0.1 % nasal spray Place 2 sprays into both nostrils 2 (two) times daily. Use in each nostril as directed  carisoprodol (SOMA) 350 MG tablet Take 350 mg by mouth at bedtime. Patient states that she takes 1/2 tablet nightly      cyanocobalamin (,VITAMIN B-12,) 1000 MCG/ML injection Inject 1 mL (1,000 mcg total) into the muscle every 30 (thirty) days. 1 mL 3   Dentifrices (BIOTENE DRY MOUTH DT) by Transmucosal route.     eletriptan (RELPAX) 40 MG tablet Take 40 mg by mouth as needed for migraine. may repeat in 2 hours if necessary      EPINEPHRINE 0.3 mg/0.3 mL IJ SOAJ injection INJECT CONTENTS OF 1 PEN AS NEEDED FOR ALLERGIC REACTION 2 each 1   estradiol (CLIMARA) 0.1 mg/24hr patch APPLY 1 PATCH TO SKIN ONCE WEEKLY 12 patch 4   Estradiol (YUVAFEM) 10 MCG TABS vaginal tablet Place 1 tablet (10 mcg total) vaginally 2 (two) times a week. 24 tablet 3   ezetimibe (ZETIA) 10 MG tablet Take 1 tablet by mouth once daily 90 tablet 1   fenofibrate (TRICOR) 145 MG tablet Take 1 tablet (145 mg total) by mouth daily. 90 tablet 1   fluticasone (FLONASE) 50 MCG/ACT nasal spray Place 2 sprays into both nostrils daily. 16 g 3   fluticasone furoate-vilanterol (BREO ELLIPTA) 100-25 MCG/ACT AEPB Inhale 1 puff into the lungs daily. 469 each 3   folic acid (FOLVITE) 1 MG tablet Take 1 tablet (1 mg total) by mouth daily. 90 tablet 1   gabapentin (NEURONTIN) 600 MG tablet Take 1/2 (one-half) tablet by mouth twice daily 45 tablet 3   HYDROcodone-acetaminophen (NORCO) 7.5-325 MG tablet One tab po tid for pain prn only 30 tablet 0   hydroxychloroquine (PLAQUENIL) 200 MG tablet Take 1 tablet by mouth twice daily 180 tablet 0   hyoscyamine (ANASPAZ) 0.125 MG TBDP disintergrating  tablet Place 0.125 mg under the tongue every 4 (four) hours as needed for bladder spasms.      Lactobacillus (DIGESTIVE HEALTH PROBIOTIC PO) Take 1 tablet by mouth daily.      levothyroxine (SYNTHROID) 125 MCG tablet Take 1 tablet (125 mcg total) by mouth daily. 90 tablet 3   LINZESS 290 MCG CAPS capsule Take 1 capsule (290 mcg total) by mouth daily. 90 capsule 3   Magnesium Oxide (NATRUL MAGNESIUM PO) Take by mouth.      montelukast (SINGULAIR) 10 MG tablet TAKE ONE TABLET BY MOUTH ONCE DAILY FOR ASTHMA 90 tablet 3   Multiple Vitamins-Minerals (ZINC PO) Take by mouth daily.     mupirocin ointment (BACTROBAN) 2 % as needed.  0   pantoprazole (PROTONIX) 40 MG tablet Take 40 mg by mouth daily.      Polyethyl Glycol-Propyl Glycol 0.4-0.3 % SOLN Apply to eye.      PRESCRIPTION MEDICATION 1 Syringe by Subdermal route once a week. Allergy shot      Probiotic Product ([place]) CAPS Take by mouth.      sodium chloride (OCEAN) 0.65 % SOLN nasal spray Place 1 spray into both nostrils as needed for congestion.      sodium chloride 0.9 % nebulizer solution      SUMAtriptan-naproxen (TREXIMET) 85-500 MG tablet daily as needed.      Syringe/Needle, Disp, (SYRINGE 3CC/25GX5/8") 25G X 5/8" 3 ML MISC Use as directed once a month 1 each 3   Tuberculin-Allergy Syringes 28G X 1/2" 1 ML MISC 1 each by Does not apply route every 14 (fourteen) days. For allergy injections 12 each 3   venlafaxine XR (EFFEXOR-XR) 150 MG 24 hr capsule Take 2 capsules (  300 mg total) by mouth at bedtime. 180 capsule 1   Vitamin D, Ergocalciferol, (DRISDOL) 1.25 MG (50000 UNIT) CAPS capsule Take 1 capsule (50,000 Units total) by mouth every 7 (seven) days. Monday 12 capsule 1   vitamin E 1000 UNIT capsule Take by mouth.      No current facility-administered medications for this visit.     Musculoskeletal: Strength & Muscle Tone:  N/A Gait & Station:  N/A Patient leans: N/A  Psychiatric Specialty Exam: Review of  Systems  There were no vitals taken for this visit.There is no height or weight on file to calculate BMI.  General Appearance: {Appearance:22683}  Eye Contact:  {BHH EYE CONTACT:22684}  Speech:  Clear and Coherent  Volume:  Normal  Mood:  {BHH MOOD:22306}  Affect:  {Affect (PAA):22687}  Thought Process:  Coherent  Orientation:  Full (Time, Place, and Person)  Thought Content: Logical   Suicidal Thoughts:  {ST/HT (PAA):22692}  Homicidal Thoughts:  {ST/HT (PAA):22692}  Memory:  Immediate;   Good  Judgement:  {Judgement (PAA):22694}  Insight:  {Insight (PAA):22695}  Psychomotor Activity:  Normal  Concentration:  Concentration: Good and Attention Span: Good  Recall:  Good  Fund of Knowledge: Good  Language: Good  Akathisia:  No  Handed:  Right  AIMS (if indicated): not done  Assets:  Communication Skills Desire for Improvement  ADL's:  Intact  Cognition: WNL  Sleep:  {BHH GOOD/FAIR/POOR:22877}   Screenings: Mini-Mental    Flowsheet Row Office Visit from 04/02/2022 in [facility], [place] from 03/26/2021 in [facility], [place] from 03/22/2020 in [facility], [place] from 03/22/2019 in [facility], [facility]  Total Score (max 30 points ) _0 [employer] Office Visit from 04/02/2022 in [facility], [facility] Office Visit from 03/03/2022 in [place] Video Visit from 10/09/2021 in [place] Office Visit from 09/24/2021 in [facility], [facility] Office Visit from 07/19/2021 in [facility], [facility]  PHQ-2 Total Score 0 _1 0      Flowsheet Row Admission (Discharged) from 10/16/2021 in [facility]  C-SSRS RISK CATEGORY No Risk        Assessment and Plan:  COCO SHARPNACK is a 66 y.o. year old female with a history of depression, anxiety,  bilateral  osteoarthritis, COPD, s/p thyroidectomy, hypothyroidism, OSA (on CPAP),  history of Celiac Disease, GERD, Sjogren's syndrome, who presents for follow up appointment for below.     1. MDD (major depressive disorder), recurrent, in partial remission ([place]) 2. Generalized anxiety disorder  Although she reports occasional down mood in the context of grief of loss of her friend and pain, she has been managing things relatively well. Other psychosocial stressors includes loss of her sister-in-law,  loss of her parents a few years ago, her medical condition of pain and tremors.  Will continue venlafaxine and amitriptyline as maintenance treatment for depression and anxiety. Noted that although it has been repeatedly discussed to discontinue amitriptyline to avoid polypharmacy and other potential side effects, she has strong preference to stay on this medication, which has been helping for her muscle spasm at night as well.    # r/o cognitive impairment Although she reports occasional forgetfulness, she denies any difficulty in IADL. TSH, folate was checked last Nov 2022; wnl.  Although she reports good benefit from Ritalin in the past,  it was reportedly discontinued due to adverse reaction.  She agrees to continue daily exercise/cognitive exercise.      Plan Continue venlafaxine 300 mg daily  Continue amitriptyline 25 mg at night (anticholinergic side effect from higher dose) Next appointment: 8/21 at 8:40, video (she is in [place] until May) - on carisoprodol (Soma), gabapentin 300 mg BID - on xanax 0.5 mg  TID, prescribed by Dr. Merlene Laughter   Past trials of medication: sertraline, Paxil, venlafaxine, Xanax, Ritalin (worked very well)    The patient demonstrates the following risk factors for suicide: Chronic risk factors for suicide include: psychiatric disorder of anxiety. Acute risk factors for suicide include: N/A. Protective factors for this patient include: positive social support, responsibility to  others (children, family), coping skills and hope for the future. Considering these factors, the overall suicide risk at this point appears to be low. She owns a gun. Patient is appropriate for outpatient follow up.         Collaboration of Care: Collaboration of Care: {BH OP Collaboration of Care:21014065}  Patient/Guardian was advised Release of Information must be obtained prior to any record release in order to collaborate their care with an outside provider. Patient/Guardian was advised if they have not already done so to contact the registration department to sign all necessary forms in order for [place] to release information regarding their care.   Consent: Patient/Guardian gives verbal consent for treatment and assignment of benefits for services provided during this visit. Patient/Guardian expressed understanding and agreed to proceed.    [name], MD 06/11/2022, 3:59 PM

## 2022-06-16 ENCOUNTER — Telehealth: Payer: Self-pay | Admitting: Psychiatry

## 2022-06-16 ENCOUNTER — Telehealth: Payer: Medicare HMO | Admitting: Psychiatry

## 2022-06-16 NOTE — Telephone Encounter (Signed)
Sent link for video visit through Epic. Patient did not sign in. Called the patient for appointment scheduled today. The patient did not answer the phone. Left voice message to contact the office ([phone]).   ?

## 2022-06-17 ENCOUNTER — Telehealth: Payer: Medicare HMO | Admitting: Psychiatry

## 2022-06-17 ENCOUNTER — Encounter: Payer: Self-pay | Admitting: Psychiatry

## 2022-06-17 DIAGNOSIS — F411 Generalized anxiety disorder: Secondary | ICD-10-CM

## 2022-06-17 DIAGNOSIS — F3342 Major depressive disorder, recurrent, in full remission: Secondary | ICD-10-CM | POA: Diagnosis not present

## 2022-06-17 DIAGNOSIS — R69 Illness, unspecified: Secondary | ICD-10-CM | POA: Diagnosis not present

## 2022-06-17 MED ORDER — AMITRIPTYLINE HCL 25 MG PO TABS: 25.0000 mg | ORAL_TABLET | Freq: Every day | ORAL | 0 refills | Status: DC

## 2022-06-17 MED ORDER — VENLAFAXINE HCL ER 150 MG PO CP24: 300.0000 mg | ORAL_CAPSULE | Freq: Every day | ORAL | 1 refills | Status: DC

## 2022-06-17 NOTE — Progress Notes (Signed)
Virtual Visit via Video Note  I connected with Susan [name] on 06/17/22 at 10:00 AM EDT by a video enabled telemedicine application and verified that I am speaking with the correct person using two identifiers.  Location: Patient: home Provider: office Persons participated in the visit- patient, provider    I discussed the limitations of evaluation and management by telemedicine and the availability of in person appointments. The patient expressed understanding and agreed to proceed.     I discussed the assessment and treatment plan with the patient. The patient was provided an opportunity to ask questions and all were answered. The patient agreed with the plan and demonstrated an understanding of the instructions.   The patient was advised to call back or seek an in-person evaluation if the symptoms worsen or if the condition fails to improve as anticipated.  I provided 10 minutes of non-face-to-face time during this encounter.   [name], MD    [facility] MD/PA/NP OP Progress Note  06/17/2022 10:27 AM Susan [name]  MRN:  [id number]  Chief Complaint:  Chief Complaint  Patient presents with   Follow-up   Depression   HPI:  This is a follow-up appointment for depression and then anxiety.  She states that she has been doing well.  Although she still feels sad about the loss of her friend, she denies concern about this.  She also talks about another friend in [place], who was found to have cancer.  She tries to stay positive.  Although she continues to have pain, it has been manageable.  She is active, taking a walk during the day.  She reports good relationship with her husband.  She takes Xanax once a day lately for anxiety with good benefit.  She sleeps well.  She denies feeling depressed.  She denies change in appetite.  She denies SI.  She denies panic attacks.  She denies irritability.  She denies alcohol use or drug use.  She wants to stay on the current medication  regimen.   She lives with her husband of 61 years, no children,  On disability Work: unemployed.  used to work as Electrical engineer for 29 years Education: graduated form high school, no IEP She grew up in [place], [place]. She had "two great christian parents."   Visit Diagnosis:    ICD-10-CM   1. MDD (major depressive disorder), recurrent, in full remission ([place])  F33.42 amitriptyline (ELAVIL) 25 MG tablet    venlafaxine XR (EFFEXOR-XR) 150 MG 24 hr capsule    2. Generalized anxiety disorder  F41.1 amitriptyline (ELAVIL) 25 MG tablet    venlafaxine XR (EFFEXOR-XR) 150 MG 24 hr capsule      Past Psychiatric History: Please see initial evaluation for full details. I have reviewed the history. No updates at this time.     Past Medical History:  Past Medical History:  Diagnosis Date   Arthritis    Asthma    Asthmatic bronchitis    BRCA negative 05/2014   MyRisk neg   Celiac disease    Celiac sprue    Chronic anxiety    Complication of anesthesia    vomiting   COPD (chronic obstructive pulmonary disease) ([place])    Depression    Environmental allergies    Family history of breast cancer 05/2014   MyRisk neg; IBIS=29.5%   Fibromyalgia    neuropathy all over   Gastritis 10/08/2013   GERD (gastroesophageal reflux disease)    Hyperlipemia    Hyperlipidemia  Intolerant to statins   Hypothyroidism (acquired)    Increased risk of breast cancer 05/2014   IBIS=29.5%   Increased risk of breast cancer 05/2014   IBIS=29.5%   Migraine    Migraines    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     Past Surgical History:  Procedure Laterality Date   ABDOMINAL HYSTERECTOMY  2006   BLADDER REPAIR     BREAST CYST ASPIRATION Left yrs ago   [place]   COLONOSCOPY N/A 10/16/2021   Procedure: COLONOSCOPY;  Surgeon: Robert Bellow, MD;  Location: [facility] ENDOSCOPY;  Service: Endoscopy;  Laterality: N/A;   COLONOSCOPY WITH ESOPHAGOGASTRODUODENOSCOPY (EGD)     COLONOSCOPY WITH PROPOFOL N/A 08/03/2017   Procedure: COLONOSCOPY WITH PROPOFOL;  Surgeon: Lollie Sails, MD;  Location: [facility] ENDOSCOPY;  Service: Endoscopy;  Laterality: N/A;   COLONOSCOPY WITH PROPOFOL N/A 10/18/2018   Procedure: COLONOSCOPY WITH PROPOFOL;  Surgeon: Lollie Sails, MD;  Location: [facility] ENDOSCOPY;  Service: Endoscopy;  Laterality: N/A;   ESOPHAGOGASTRODUODENOSCOPY N/A 10/07/2013   Procedure: ESOPHAGOGASTRODUODENOSCOPY (EGD);  Surgeon: Danie Binder, MD;  [facility];  Service: Endoscopy;  Laterality: N/A;  patient received heparin at 530am given phenergan 65m IV 30 minutes before   ESOPHAGOGASTRODUODENOSCOPY     x4   EYE SURGERY     lacrimal gland    FLEXIBLE BRONCHOSCOPY N/A 06/04/2016   Procedure: FLEXIBLE BRONCHOSCOPY;  Surgeon: SAllyne Gee MD;  Location: [facility] ORS;  Service: Pulmonary;  Laterality: N/A;   INCONTINENCE SURGERY  2004   NASAL SINUS SURGERY     THYROID LOBECTOMY     TOTAL THYROIDECTOMY      Family Psychiatric History: Please see initial evaluation for full details. I have reviewed the history. No updates at this time.     Family History:  Family History  Problem Relation Age of Onset   Arthritis/Rheumatoid Mother        died in age 4853s  Breast cancer Mother 433  Breast cancer Maternal Aunt        X 2. 50's   Breast cancer Maternal Aunt    Lung cancer Maternal Aunt    Breast cancer Maternal Aunt    Breast cancer Maternal Grandmother        40's   Pancreatitis Neg Hx    Colon cancer Neg Hx     Social History:  Social History   Socioeconomic History   Marital status: Married    Spouse name: Not on file   Number of children: 0   Years of education: Not on file   Highest education level: Not on file  Occupational History   Not on file  Tobacco Use   Smoking status: Never     Passive exposure: Past   Smokeless tobacco: Never  Vaping Use   Vaping Use: Never used  Substance and Sexual Activity   Alcohol use: No   Drug use: No   Sexual activity: Yes    Birth control/protection: Surgical  Other Topics Concern   Not on file  Social History Narrative   Not on file   Social Determinants of Health   Financial Resource Strain: Low Risk  (03/07/2021)   Overall Financial Resource Strain (CARDIA)    Difficulty of Paying Living Expenses: Not very hard  Food Insecurity: Not  on file  Transportation Needs: Not on file  Physical Activity: Not on file  Stress: Not on file  Social Connections: Not on file    Allergies:  Allergies  Allergen Reactions   Aspirin    Keflex [Cephalexin] Shortness Of Breath   Azithromycin Hives and Nausea Only   Barley Grass    Clarithromycin Other (See Comments)   Metoclopramide Other (See Comments)    TREMORS Tremors Tremors    Oat Other (See Comments)   Oatmeal Other (See Comments)   Other     Other reaction(s): Other (See Comments) KEOPLEX.   Rye Grass Flower Pollen Extract [Gramineae Pollens]    Vioxx [Rofecoxib] Diarrhea   Wheat Bran Other (See Comments)    Celiac disease   Wheat Extract    Celebrex [Celecoxib] Hives   Metoclopramide Hcl Anxiety    BODY TREMORS    Metabolic Disorder Labs: No results found for: "HGBA1C", "MPG" No results found for: "PROLACTIN" Lab Results  Component Value Date   CHOL 188 04/16/2021   TRIG 135 04/16/2021   HDL 51 04/16/2021   CHOLHDL 3.7 04/16/2021   VLDL 22 03/23/2020   LDLCALC 113 (H) 04/16/2021   LDLCALC 118 (H) 03/23/2020   Lab Results  Component Value Date   TSH 2.640 09/05/2021   TSH 0.056 (L) 04/16/2021    Therapeutic Level Labs: No results found for: "LITHIUM" No results found for: "VALPROATE" No results found for: "CBMZ"  Current Medications: Current Outpatient Medications  Medication Sig Dispense Refill   acetaminophen (TYLENOL) 500 MG tablet Take by  mouth as needed.     albuterol (PROVENTIL) (2.5 MG/3ML) 0.083% nebulizer solution Take 3 mLs (2.5 mg total) by nebulization every 6 (six) hours as needed for wheezing or shortness of breath. 75 mL 6   albuterol (VENTOLIN HFA) 108 (90 Base) MCG/ACT inhaler Inhale 2 puffs into the lungs every 6 (six) hours as needed for wheezing or shortness of breath. 1 each 2   ALPRAZolam (XANAX) 0.5 MG tablet Take 1 tablet (0.5 mg total) by mouth 3 (three) times daily as needed for anxiety. 90 tablet 0   [START ON 08/06/2022] amitriptyline (ELAVIL) 25 MG tablet Take 1 tablet (25 mg total) by mouth at bedtime. 90 tablet 0   Ascorbic Acid (VITAMIN C) 100 MG tablet Take 100 mg by mouth daily.      azelastine (ASTELIN) 0.1 % nasal spray Place 2 sprays into both nostrils 2 (two) times daily. Use in each nostril as directed     carisoprodol (SOMA) 350 MG tablet Take 350 mg by mouth at bedtime. Patient states that she takes 1/2 tablet nightly      cyanocobalamin (,VITAMIN B-12,) 1000 MCG/ML injection Inject 1 mL (1,000 mcg total) into the muscle every 30 (thirty) days. 1 mL 3   Dentifrices (BIOTENE DRY MOUTH DT) by Transmucosal route.     eletriptan (RELPAX) 40 MG tablet Take 40 mg by mouth as needed for migraine. may repeat in 2 hours if necessary      EPINEPHRINE 0.3 mg/0.3 mL IJ SOAJ injection INJECT CONTENTS OF 1 PEN AS NEEDED FOR ALLERGIC REACTION 2 each 1   estradiol (CLIMARA) 0.1 mg/24hr patch APPLY 1 PATCH TO SKIN ONCE WEEKLY 12 patch 4   Estradiol (YUVAFEM) 10 MCG TABS vaginal tablet Place 1 tablet (10 mcg total) vaginally 2 (two) times a week. 24 tablet 3   ezetimibe (ZETIA) 10 MG tablet Take 1 tablet by mouth once daily 90 tablet 1   fenofibrate (  TRICOR) 145 MG tablet Take 1 tablet (145 mg total) by mouth daily. 90 tablet 1   fluticasone (FLONASE) 50 MCG/ACT nasal spray Place 2 sprays into both nostrils daily. 16 g 3   fluticasone furoate-vilanterol (BREO ELLIPTA) 100-25 MCG/ACT AEPB Inhale 1 puff into the  lungs daily. 169 each 3   folic acid (FOLVITE) 1 MG tablet Take 1 tablet (1 mg total) by mouth daily. 90 tablet 1   gabapentin (NEURONTIN) 600 MG tablet Take 1/2 (one-half) tablet by mouth twice daily 45 tablet 3   HYDROcodone-acetaminophen (NORCO) 7.5-325 MG tablet One tab po tid for pain prn only 30 tablet 0   hydroxychloroquine (PLAQUENIL) 200 MG tablet Take 1 tablet by mouth twice daily 180 tablet 0   hyoscyamine (ANASPAZ) 0.125 MG TBDP disintergrating tablet Place 0.125 mg under the tongue every 4 (four) hours as needed for bladder spasms.      Lactobacillus (DIGESTIVE HEALTH PROBIOTIC PO) Take 1 tablet by mouth daily.      levothyroxine (SYNTHROID) 125 MCG tablet Take 1 tablet (125 mcg total) by mouth daily. 90 tablet 3   LINZESS 290 MCG CAPS capsule Take 1 capsule (290 mcg total) by mouth daily. 90 capsule 3   Magnesium Oxide (NATRUL MAGNESIUM PO) Take by mouth.      montelukast (SINGULAIR) 10 MG tablet TAKE ONE TABLET BY MOUTH ONCE DAILY FOR ASTHMA 90 tablet 3   Multiple Vitamins-Minerals (ZINC PO) Take by mouth daily.     mupirocin ointment (BACTROBAN) 2 % as needed.  0   pantoprazole (PROTONIX) 40 MG tablet Take 40 mg by mouth daily.      Polyethyl Glycol-Propyl Glycol 0.4-0.3 % SOLN Apply to eye.      PRESCRIPTION MEDICATION 1 Syringe by Subdermal route once a week. Allergy shot      Probiotic Product ([place]) CAPS Take by mouth.      sodium chloride (OCEAN) 0.65 % SOLN nasal spray Place 1 spray into both nostrils as needed for congestion.      sodium chloride 0.9 % nebulizer solution      SUMAtriptan-naproxen (TREXIMET) 85-500 MG tablet daily as needed.      Syringe/Needle, Disp, (SYRINGE 3CC/25GX5/8") 25G X 5/8" 3 ML MISC Use as directed once a month 1 each 3   Tuberculin-Allergy Syringes 28G X 1/2" 1 ML MISC 1 each by Does not apply route every 14 (fourteen) days. For allergy injections 12 each 3   [START ON 08/31/2022] venlafaxine XR (EFFEXOR-XR) 150 MG 24 hr  capsule Take 2 capsules (300 mg total) by mouth at bedtime. 180 capsule 1   Vitamin D, Ergocalciferol, (DRISDOL) 1.25 MG (50000 UNIT) CAPS capsule Take 1 capsule (50,000 Units total) by mouth every 7 (seven) days. Monday 12 capsule 1   vitamin E 1000 UNIT capsule Take by mouth.      No current facility-administered medications for this visit.     Musculoskeletal: Strength & Muscle Tone:  N/A Gait & Station:  N/A Patient leans: N/A  Psychiatric Specialty Exam: Review of Systems  Psychiatric/Behavioral:  Negative for agitation, behavioral problems, confusion, decreased concentration, dysphoric mood, hallucinations, self-injury, sleep disturbance and suicidal ideas. The patient is not nervous/anxious and is not hyperactive.   All other systems reviewed and are negative.   There were no vitals taken for this visit.There is no height or weight on file to calculate BMI.  General Appearance: Fairly Groomed  Eye Contact:  Good  Speech:  Clear and Coherent  Volume:  Normal  Mood:   good  Affect:  Appropriate, Congruent, and Full Range  Thought Process:  Coherent  Orientation:  Full (Time, Place, and Person)  Thought Content: Logical   Suicidal Thoughts:  No  Homicidal Thoughts:  No  Memory:  Immediate;   Good  Judgement:  Good  Insight:  Good  Psychomotor Activity:  Normal  Concentration:  Concentration: Good and Attention Span: Good  Recall:  Good  Fund of Knowledge: Good  Language: Good  Akathisia:  No  Handed:  Right  AIMS (if indicated): not done  Assets:  Communication Skills Desire for Improvement  ADL's:  Intact  Cognition: WNL  Sleep:  Good   Screenings: Mini-Mental    Flowsheet Row Office Visit from 04/02/2022 in [facility], [place] from 03/26/2021 in [facility], [place] from 03/22/2020 in [facility] [place] from 03/22/2019 in [facility], [facility]  Total Score (max 30 points ) _0 [employer]    [place] Visit from 04/02/2022 in [facility], [facility] Office Visit from 03/03/2022 in [place] Visit from 10/09/2021 in [place] Office Visit from 09/24/2021 in [facility], [facility] Office Visit from 07/19/2021 in [facility], [facility]  PHQ-2 Total Score 0 _1 0      Flowsheet Row Admission (Discharged) from 10/16/2021 in [place] [facility]COPY  C-SSRS RISK CATEGORY No Risk        Assessment and Plan:  THRESEA DOBLE is a 66 y.o. year old female with a history of depression, anxiety,  bilateral osteoarthritis, COPD, s/p thyroidectomy, hypothyroidism, OSA (on CPAP),  history of Celiac Disease, GERD, Sjogren's syndrome , who presents for follow up appointment for below.   1. MDD (major depressive disorder), recurrent, in full remission ([place]) 2. Generalized anxiety disorder She denies any significant depressive symptoms or anxiety since the last visit.  Psychosocial stressors includes loss of her friend, and she has another friend, who was found to have cancer.  Other psychosocial stressors include his pain, loss of her sister-in-law,  loss of her parents a few years ago.  She enjoys the time with her husband, and is active during the day.  Although it has been discussed at least several times to try discontinuation of amitriptyline to avoid polypharmacy and another potential side effects includes dry mouth, she had a strong preference to stay on the current medication regimen.  Will continue amitriptyline at the current dose to target depression and anxiety, which has been helping for muscle spasm according to the patient.  Will continue venlafaxine to target depression and anxiety.    # r/o cognitive impairment Unchanged. Although she reports occasional forgetfulness, she denies any difficulty in IADL. TSH, folate was checked last Nov 2022;  wnl.  Although she reports good benefit from Ritalin in the past, it was reportedly discontinued due to adverse reaction.  She agrees to continue daily exercise/cognitive exercise.      Plan Continue venlafaxine 300 mg daily  Continue amitriptyline 25 mg at night (anticholinergic side effect from higher dose) Next appointment: 12/13 at 8 AM, video - on carisoprodol (Soma), gabapentin 300 mg BID - on xanax 0.5 mg  TID, prescribed by Dr. Merlene Laughter   Past trials of medication: sertraline, Paxil, venlafaxine, Xanax, Ritalin (worked very well)    The patient demonstrates the following risk factors for suicide: Chronic risk factors for  suicide include: psychiatric disorder of anxiety. Acute risk factors for suicide include: N/A. Protective factors for this patient include: positive social support, responsibility to others (children, family), coping skills and hope for the future. Considering these factors, the overall suicide risk at this point appears to be low. She owns a gun. Patient is appropriate for outpatient follow up.  Collaboration of Care: Collaboration of Care: Other N/A  Patient/Guardian was advised Release of Information must be obtained prior to any record release in order to collaborate their care with an outside provider. Patient/Guardian was advised if they have not already done so to contact the registration department to sign all necessary forms in order for [place] to release information regarding their care.   Consent: Patient/Guardian gives verbal consent for treatment and assignment of benefits for services provided during this visit. Patient/Guardian expressed understanding and agreed to proceed.   This clinician has discussed the side effect associated with medication prescribed during this encounter. Please refer to notes in the previous encounters for more details.    [name], MD 06/17/2022, 10:27 AM

## 2022-07-04 DIAGNOSIS — E890 Postprocedural hypothyroidism: Secondary | ICD-10-CM | POA: Diagnosis not present

## 2022-07-04 DIAGNOSIS — Z79899 Other long term (current) drug therapy: Secondary | ICD-10-CM | POA: Diagnosis not present

## 2022-07-04 DIAGNOSIS — Z0000 Encounter for general adult medical examination without abnormal findings: Secondary | ICD-10-CM | POA: Insufficient documentation

## 2022-07-04 DIAGNOSIS — Z23 Encounter for immunization: Secondary | ICD-10-CM | POA: Diagnosis not present

## 2022-07-04 DIAGNOSIS — M3500 Sicca syndrome, unspecified: Secondary | ICD-10-CM | POA: Insufficient documentation

## 2022-07-04 DIAGNOSIS — G43D0 Abdominal migraine, not intractable: Secondary | ICD-10-CM | POA: Insufficient documentation

## 2022-07-04 DIAGNOSIS — M1990 Unspecified osteoarthritis, unspecified site: Secondary | ICD-10-CM | POA: Insufficient documentation

## 2022-07-04 DIAGNOSIS — E785 Hyperlipidemia, unspecified: Secondary | ICD-10-CM | POA: Insufficient documentation

## 2022-08-18 ENCOUNTER — Telehealth: Payer: Self-pay

## 2022-08-18 ENCOUNTER — Other Ambulatory Visit: Payer: Self-pay | Admitting: Nurse Practitioner

## 2022-08-18 ENCOUNTER — Other Ambulatory Visit: Payer: Self-pay | Admitting: Internal Medicine

## 2022-08-18 DIAGNOSIS — Z1231 Encounter for screening mammogram for malignant neoplasm of breast: Secondary | ICD-10-CM

## 2022-08-18 NOTE — Telephone Encounter (Signed)
Patient called to have mammogram ordered placed. She has an appointment with you on 10/21/2022 and her mammogram is due on 1214/2023. Ordered was placed and patient was informed to call and schedule.

## 2022-08-25 ENCOUNTER — Encounter (INDEPENDENT_AMBULATORY_CARE_PROVIDER_SITE_OTHER): Payer: Self-pay

## 2022-08-27 ENCOUNTER — Other Ambulatory Visit: Payer: Self-pay | Admitting: Nurse Practitioner

## 2022-08-27 DIAGNOSIS — Z760 Encounter for issue of repeat prescription: Secondary | ICD-10-CM

## 2022-08-28 NOTE — Telephone Encounter (Signed)
Last6/7/23 and next 12/23

## 2022-08-29 NOTE — Telephone Encounter (Signed)
Med sent to pharmacy.

## 2022-09-02 DIAGNOSIS — G4733 Obstructive sleep apnea (adult) (pediatric): Secondary | ICD-10-CM | POA: Diagnosis not present

## 2022-09-09 ENCOUNTER — Ambulatory Visit (INDEPENDENT_AMBULATORY_CARE_PROVIDER_SITE_OTHER): Payer: Medicare HMO | Admitting: Internal Medicine

## 2022-09-09 VITALS — BP 127/78 | HR 76 | Temp 98.0°F | Resp 16 | Ht 65.0 in | Wt 187.0 lb

## 2022-09-09 DIAGNOSIS — J301 Allergic rhinitis due to pollen: Secondary | ICD-10-CM

## 2022-09-09 DIAGNOSIS — G4733 Obstructive sleep apnea (adult) (pediatric): Secondary | ICD-10-CM | POA: Diagnosis not present

## 2022-09-09 DIAGNOSIS — Z7189 Other specified counseling: Secondary | ICD-10-CM

## 2022-09-09 NOTE — Progress Notes (Signed)
[facility] [place], [place] [address]  Pulmonary Sleep Medicine   Office Visit Note  Patient Name: Susan [name] DOB: [birth year] MRN [id number]  Date of Service: 09/09/2022  Complaints/HPI: She has completed her allergy shots and we are looking at stopping them. She has gained good benefit from the shots. She has no issues noted at this time. No cough no nasal drip noted. Denies fevers or chills.   ROS  General: (-) fever, (-) chills, (-) night sweats, (-) weakness Skin: (-) rashes, (-) itching,. Eyes: (-) visual changes, (-) redness, (-) itching. Nose and Sinuses: (-) nasal stuffiness or itchiness, (-) postnasal drip, (-) nosebleeds, (-) sinus trouble. Mouth and Throat: (-) sore throat, (-) hoarseness. Neck: (-) swollen glands, (-) enlarged thyroid, (-) neck pain. Respiratory: - cough, (-) bloody sputum, - shortness of breath, - wheezing. Cardiovascular: - ankle swelling, (-) chest pain. Lymphatic: (-) lymph node enlargement. Neurologic: (-) numbness, (-) tingling. Psychiatric: (-) anxiety, (-) depression   Current Medication: Outpatient Encounter Medications as of 09/09/2022  Medication Sig   acetaminophen (TYLENOL) 500 MG tablet Take by mouth as needed.   albuterol (PROVENTIL) (2.5 MG/3ML) 0.083% nebulizer solution Take 3 mLs (2.5 mg total) by nebulization every 6 (six) hours as needed for wheezing or shortness of breath.   albuterol (VENTOLIN HFA) 108 (90 Base) MCG/ACT inhaler Inhale 2 puffs into the lungs every 6 (six) hours as needed for wheezing or shortness of breath.   ALPRAZolam (XANAX) 0.5 MG tablet TAKE 1 TABLET BY MOUTH THREE TIMES DAILY AS NEEDED FOR ANXIETY   amitriptyline (ELAVIL) 25 MG tablet Take 1 tablet (25 mg total) by mouth at bedtime.   Ascorbic Acid (VITAMIN C) 100 MG tablet Take 100 mg by mouth daily.    azelastine (ASTELIN) 0.1 % nasal spray Place 2 sprays into both nostrils 2 (two) times daily. Use in each nostril as  directed   carisoprodol (SOMA) 350 MG tablet Take 350 mg by mouth at bedtime. Patient states that she takes 1/2 tablet nightly    cyanocobalamin (,VITAMIN B-12,) 1000 MCG/ML injection Inject 1 mL (1,000 mcg total) into the muscle every 30 (thirty) days.   Dentifrices (BIOTENE DRY MOUTH DT) by Transmucosal route.   eletriptan (RELPAX) 40 MG tablet Take 40 mg by mouth as needed for migraine. may repeat in 2 hours if necessary    EPINEPHRINE 0.3 mg/0.3 mL IJ SOAJ injection INJECT CONTENTS OF 1 PEN AS NEEDED FOR ALLERGIC REACTION   estradiol (CLIMARA) 0.1 mg/24hr patch APPLY 1 PATCH TO SKIN ONCE WEEKLY   Estradiol (YUVAFEM) 10 MCG TABS vaginal tablet Place 1 tablet (10 mcg total) vaginally 2 (two) times a week.   ezetimibe (ZETIA) 10 MG tablet Take 1 tablet by mouth once daily   fenofibrate (TRICOR) 145 MG tablet Take 1 tablet (145 mg total) by mouth daily.   fluticasone (FLONASE) 50 MCG/ACT nasal spray Place 2 sprays into both nostrils daily.   fluticasone furoate-vilanterol (BREO ELLIPTA) 100-25 MCG/ACT AEPB Inhale 1 puff into the lungs daily.   folic acid (FOLVITE) 1 MG tablet Take 1 tablet (1 mg total) by mouth daily.   gabapentin (NEURONTIN) 600 MG tablet Take 1/2 (one-half) tablet by mouth twice daily   HYDROcodone-acetaminophen (NORCO) 7.5-325 MG tablet One tab po tid for pain prn only   hydroxychloroquine (PLAQUENIL) 200 MG tablet Take 1 tablet by mouth twice daily   hyoscyamine (ANASPAZ) 0.125 MG TBDP disintergrating tablet Place 0.125 mg under the tongue every 4 (  four) hours as needed for bladder spasms.    Lactobacillus (DIGESTIVE HEALTH PROBIOTIC PO) Take 1 tablet by mouth daily.    levothyroxine (SYNTHROID) 125 MCG tablet Take 1 tablet (125 mcg total) by mouth daily.   LINZESS 290 MCG CAPS capsule Take 1 capsule (290 mcg total) by mouth daily.   Magnesium Oxide (NATRUL MAGNESIUM PO) Take by mouth.    montelukast (SINGULAIR) 10 MG tablet TAKE ONE TABLET BY MOUTH ONCE DAILY FOR ASTHMA    Multiple Vitamins-Minerals (ZINC PO) Take by mouth daily.   mupirocin ointment (BACTROBAN) 2 % as needed.   pantoprazole (PROTONIX) 40 MG tablet Take 40 mg by mouth daily.    Polyethyl Glycol-Propyl Glycol 0.4-0.3 % SOLN Apply to eye.    PRESCRIPTION MEDICATION 1 Syringe by Subdermal route once a week. Allergy shot    Probiotic Product ([place]) CAPS Take by mouth.    sodium chloride (OCEAN) 0.65 % SOLN nasal spray Place 1 spray into both nostrils as needed for congestion.    sodium chloride 0.9 % nebulizer solution    SUMAtriptan-naproxen (TREXIMET) 85-500 MG tablet daily as needed.    Syringe/Needle, Disp, (SYRINGE 3CC/25GX5/8") 25G X 5/8" 3 ML MISC Use as directed once a month   Tuberculin-Allergy Syringes 28G X 1/2" 1 ML MISC 1 each by Does not apply route every 14 (fourteen) days. For allergy injections   venlafaxine XR (EFFEXOR-XR) 150 MG 24 hr capsule Take 2 capsules (300 mg total) by mouth at bedtime.   Vitamin D, Ergocalciferol, (DRISDOL) 1.25 MG (50000 UNIT) CAPS capsule Take 1 capsule (50,000 Units total) by mouth every 7 (seven) days. Monday   vitamin E 1000 UNIT capsule Take by mouth.    No facility-administered encounter medications on file as of 09/09/2022.    Surgical History: Past Surgical History:  Procedure Laterality Date   ABDOMINAL HYSTERECTOMY  2006   BLADDER REPAIR     BREAST CYST ASPIRATION Left yrs ago   [place]   COLONOSCOPY N/A 10/16/2021   Procedure: COLONOSCOPY;  Surgeon: Robert Bellow, MD;  Location: [facility] ENDOSCOPY;  Service: Endoscopy;  Laterality: N/A;   COLONOSCOPY WITH ESOPHAGOGASTRODUODENOSCOPY (EGD)     COLONOSCOPY WITH PROPOFOL N/A 08/03/2017   Procedure: COLONOSCOPY WITH PROPOFOL;  Surgeon: Lollie Sails, MD;  Location: [facility] ENDOSCOPY;  Service: Endoscopy;  Laterality: N/A;   COLONOSCOPY WITH PROPOFOL N/A 10/18/2018   Procedure: COLONOSCOPY WITH PROPOFOL;  Surgeon: Lollie Sails, MD;  Location: [facility]  ENDOSCOPY;  Service: Endoscopy;  Laterality: N/A;   ESOPHAGOGASTRODUODENOSCOPY N/A 10/07/2013   Procedure: ESOPHAGOGASTRODUODENOSCOPY (EGD);  Surgeon: Danie Binder, MD;  Location: [facility] ENDO SUITE;  Service: Endoscopy;  Laterality: N/A;  patient received heparin at 530am given phenergan 50m IV 30 minutes before   ESOPHAGOGASTRODUODENOSCOPY     x4   EYE SURGERY     lacrimal gland    FLEXIBLE BRONCHOSCOPY N/A 06/04/2016   Procedure: FLEXIBLE BRONCHOSCOPY;  Surgeon: SAllyne Gee MD;  Location: [facility] ORS;  Service: Pulmonary;  Laterality: N/A;   INCONTINENCE SURGERY  2004   NASAL SINUS SURGERY     THYROID LOBECTOMY     TOTAL THYROIDECTOMY      Medical History: Past Medical History:  Diagnosis Date   Arthritis    Asthma    Asthmatic bronchitis    BRCA negative 05/2014   MyRisk neg   Celiac disease    Celiac sprue    Chronic anxiety    Complication of anesthesia  vomiting   COPD (chronic obstructive pulmonary disease) (HCC)    Depression    Environmental allergies    Family history of breast cancer 05/2014   MyRisk neg; IBIS=29.5%   Fibromyalgia    neuropathy all over   Gastritis 10/08/2013   GERD (gastroesophageal reflux disease)    Hyperlipemia    Hyperlipidemia    Intolerant to statins   Hypothyroidism (acquired)    Increased risk of breast cancer 05/2014   IBIS=29.5%   Increased risk of breast cancer 05/2014   IBIS=29.5%   Migraine    Migraines    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     Family History: Family History  Problem Relation Age of Onset   Arthritis/Rheumatoid Mother        died in age 71s   Breast cancer Mother 65   Breast cancer Maternal Aunt        X 2. 50's   Breast cancer Maternal Aunt    Lung cancer Maternal Aunt    Breast cancer Maternal Aunt    Breast cancer Maternal Grandmother        40's    Pancreatitis Neg Hx    Colon cancer Neg Hx     Social History: Social History   Socioeconomic History   Marital status: Married    Spouse name: Not on file   Number of children: 0   Years of education: Not on file   Highest education level: Not on file  Occupational History   Not on file  Tobacco Use   Smoking status: Never    Passive exposure: Past   Smokeless tobacco: Never  Vaping Use   Vaping Use: Never used  Substance and Sexual Activity   Alcohol use: No   Drug use: No   Sexual activity: Yes    Birth control/protection: Surgical  Other Topics Concern   Not on file  Social History Narrative   Not on file   Social Determinants of Health   Financial Resource Strain: Low Risk  (03/07/2021)   Overall Financial Resource Strain (CARDIA)    Difficulty of Paying Living Expenses: Not very hard  Food Insecurity: Not on file  Transportation Needs: Not on file  Physical Activity: Not on file  Stress: Not on file  Social Connections: Not on file  Intimate Partner Violence: Not on file    Vital Signs: Blood pressure 127/78, pulse 76, temperature 98 F (36.7 C), resp. rate 16, height _0  (1.651 m), weight 187 lb (84.8 kg), SpO2 96 %.  Examination: General Appearance: The patient is well-developed, well-nourished, and in no distress. Skin: Gross inspection of skin unremarkable. Head: normocephalic, no gross deformities. Eyes: no gross deformities noted. ENT: ears appear grossly normal no exudates. Neck: Supple. No thyromegaly. No LAD. Respiratory: no rhonchi noted. Cardiovascular: Normal S1 and S2 without murmur or rub. Extremities: No cyanosis. pulses are equal. Neurologic: Alert and oriented. No involuntary movements.  LABS: No results found for this or any previous visit (from the past 2160 hour(s)).  Radiology: US PELVIC COMPLETE WITH TRANSVAGINAL  Result Date: 11/13/2021 CLINICAL DATA:  Vaginal wall cyst, abnormal CT, prior hysterectomy and BILATERAL  oophorectomy EXAM: TRANSABDOMINAL AND TRANSVAGINAL ULTRASOUND OF PELVIS TECHNIQUE: Both transabdominal and transvaginal ultrasound examinations of the pelvis were performed. Transabdominal technique was performed for global imaging of the pelvis including  uterus, ovaries, adnexal regions, and pelvic cul-de-sac. It was necessary to proceed with endovaginal exam following the transabdominal exam to visualize the vaginal cuff and adnexa. COMPARISON:  CT abdomen and pelvis 10/31/2021 FINDINGS: Uterus Surgically absent Endometrium Surgically absent Right ovary Surgically absent Left ovary Surgically absent Other findings No free pelvic fluid. Hypoechoic nodule identified at RIGHT vaginal cuff 2.0 x 2.4 x 2.0 cm, containing low level internal echogenicity. No internal blood flow on color Doppler imaging. No additional mass identified. IMPRESSION: Post hysterectomy and BILATERAL oophorectomy. 2.4 x 2.4 x 2.0 cm diameter hypoechoic nodule at RIGHT vaginal cuff, nonspecific, with differential diagnosis including a complicated cyst, endometrioma, or a mildly complicated postoperative collection such as old hematoma or atypical seroma/lymphocele. This corresponds to the CT finding. Electronically Signed   By: Lavonia Dana M.D.   On: 11/13/2021 15:17    No results found.  No results found.    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 06/20/2020   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   Essential tremor 06/20/2020   OSA (obstructive sleep apnea) 03/26/2020   Chronic wrist pain, right 11/02/2019   Chronic pain of left wrist 11/02/2019   Pain in joints of right hand 11/02/2019   Pain in joint of left hand 11/02/2019   Primary osteoarthritis  of both hands 11/02/2019   MDD (major depressive disorder), recurrent, 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/05/2019   Dysuria 04/03/2019   Generalized anxiety disorder 02/28/2019   Chronic pain of both knees 03/01/2018   Encounter for general adult medical examination with abnormal findings 03/01/2018   Primary osteoarthritis of both first carpometacarpal joints 03/01/2018   Chronic obstructive pulmonary disease, unspecified ([place]) 12/04/2017   Shortness of breath 12/04/2017   Allergic rhinitis, unspecified 12/04/2017   Low grade squamous intraepith lesion on cytologic smear vagina (lgsil) 09/09/2017   Vaginal atrophy 09/01/2017   Chronic right-sided low back pain without sciatica 01/05/2017   Primary Sjogren's syndrome ([place]) 01/05/2017   CD (celiac disease) 04/17/2015   Gastroesophageal reflux disease with esophagitis 04/17/2015   Hx of Sjogren's disease ([place]) 04/17/2015   Acute asthma exacerbation 10/14/2013   Acute respiratory failure ([place]) 10/14/2013   Gastritis 10/08/2013   Bradycardia 10/07/2013   Esophageal spasm 10/06/2013   Substernal precordial chest pain 10/06/2013   Chronic anxiety 10/06/2013   GERD (gastroesophageal reflux disease) 10/06/2013   Chronic asthma 10/06/2013   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 OSA (obstructive sleep apnea) She is on PAP therapy has issues with dryness due to sjogrens. She states she is trying to find solutions with humidifcation etc.   2. Seasonal allergic rhinitis due to pollen Antihistamines for now as needed  3. CPAP use counseling CPAP Counseling: had a lengthy discussion with the patient regarding the importance of PAP therapy in management of the sleep apnea.    General Counseling: I have discussed the findings of the evaluation and examination with [place].  I have also discussed any further diagnostic evaluation  thatmay be needed or ordered today. Donnice verbalizes understanding of the findings of todays visit. We also reviewed her medications today and discussed drug interactions and side effects including but  not limited excessive drowsiness and altered mental states. We also discussed that there is always a risk not just to her but also people around her. she has been encouraged to call the office with any questions or concerns that should arise related to todays visit.  No orders of the defined types were placed in this encounter.    Time spent: 70  I have personally obtained a history, examined the patient, evaluated laboratory and imaging results, formulated the assessment and plan and placed orders.    Allyne Gee, MD [facility] Pulmonary and Critical Care Sleep medicine

## 2022-09-09 NOTE — Patient Instructions (Signed)

## 2022-09-16 NOTE — Progress Notes (Signed)
Office Visit Note  Patient: Susan [name]             Date of Birth: 10-12-1956           MRN: [id number]             PCP: Lavera Guise, MD Referring: Lavera Guise, MD Visit Date: 09/30/2022 Occupation: _0 @  Subjective:  Pain in multiple joints  History of Present Illness: Susan [name] is a 66 y.o. female with history of Sjogren's, osteoarthritis and fibromyalgia syndrome.  She states she continues to have pain and discomfort in her multiple joints.  She complains of discomfort in her bilateral hands, bilateral knee joints and her feet.  She also has generalized pain and discomfort from fibromyalgia syndrome.  She states the summer she went to [place] and was there for about 8 weeks.  She states while she was swimming she sprained her lower back and she has intermittent discomfort in the lower back.  She continues to have dry mouth and dry eyes.  She has been using over-the-counter products and taking hydroxychloroquine.  Her eye examination is coming up in December.  She also had an episode of vertigo this week.  She has an appointment with her PCP tomorrow for further evaluation.  Activities of Daily Living:  Patient reports morning stiffness for 20 minutes.   Patient Reports nocturnal pain.  Difficulty dressing/grooming: Reports Difficulty climbing stairs: Denies Difficulty getting out of chair: Denies Difficulty using hands for taps, buttons, cutlery, and/or writing: Reports  Review of Systems  Constitutional:  Positive for fatigue.  HENT:  Positive for mouth sores and mouth dryness.   Eyes:  Positive for dryness.  Respiratory:  Positive for shortness of breath.   Cardiovascular:  Negative for chest pain and palpitations.  Gastrointestinal:  Positive for constipation and diarrhea. Negative for blood in stool.  Endocrine: Negative for increased urination.  Genitourinary:  Negative for involuntary urination.  Musculoskeletal:  Positive for joint pain, gait problem,  joint pain, myalgias, morning stiffness, muscle tenderness and myalgias. Negative for joint swelling and muscle weakness.  Skin:  Positive for hair loss and sensitivity to sunlight. Negative for color change and rash.  Allergic/Immunologic: Negative for susceptible to infections.  Neurological:  Positive for dizziness. Negative for headaches.  Hematological:  Negative for swollen glands.  Psychiatric/Behavioral:  Positive for sleep disturbance. Negative for depressed mood. The patient is nervous/anxious.     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   Essential tremor 06/20/2020   OSA (obstructive sleep apnea) 03/26/2020   Chronic wrist pain, right 11/02/2019   Chronic pain of left wrist 11/02/2019   Pain in joints of right hand 11/02/2019   Pain in joint of left hand 11/02/2019   Primary osteoarthritis of both hands 11/02/2019   MDD (major depressive disorder), recurrent, in partial remission ([place]) 10/05/2019   Dysuria 04/03/2019   Generalized anxiety disorder 02/28/2019   Chronic pain of both knees 03/01/2018   Encounter for general adult medical examination with abnormal findings 03/01/2018   Primary osteoarthritis of both first carpometacarpal joints 03/01/2018   Chronic obstructive pulmonary disease, unspecified ([place]) 12/04/2017   Shortness of breath 12/04/2017   Allergic rhinitis, unspecified  12/04/2017   Low grade squamous intraepith lesion on cytologic smear vagina (lgsil) 09/09/2017   Vaginal atrophy 09/01/2017   Chronic right-sided low back pain without sciatica 01/05/2017   Primary Sjogren's syndrome ([place]) 01/05/2017   CD (celiac disease) 04/17/2015    Gastroesophageal reflux disease with esophagitis 04/17/2015   Hx of Sjogren's disease ([place]) 04/17/2015   Acute asthma exacerbation 10/14/2013   Acute respiratory failure ([place]) 10/14/2013   Gastritis 10/08/2013   Bradycardia 10/07/2013   Esophageal spasm 10/06/2013   Substernal precordial chest pain 10/06/2013   Chronic anxiety 10/06/2013   GERD (gastroesophageal reflux disease) 10/06/2013   Chronic asthma 10/06/2013   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   Depression with anxiety 10/06/2013   Fibromyalgia 10/06/2013   Hypothyroidism 10/06/2013   Chronic migraine 10/06/2013    Past Medical History:  Diagnosis Date   Arthritis    Asthma    Asthmatic bronchitis    BRCA negative 05/2014   MyRisk neg   Celiac disease    Celiac sprue    Chronic anxiety    Complication of anesthesia    vomiting   COPD (chronic obstructive pulmonary disease) ([place])    Depression    Environmental allergies    Family history of breast cancer 05/2014   MyRisk neg; IBIS=29.5%   Fibromyalgia    neuropathy all over   Gastritis 10/08/2013   GERD (gastroesophageal reflux disease)    Hyperlipemia    Hyperlipidemia    Intolerant to statins   Hypothyroidism (acquired)    Increased risk of breast cancer 05/2014   IBIS=29.5%   Increased risk of breast cancer 05/2014   IBIS=29.5%   Migraine    Migraines    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     Family History  Problem Relation Age of Onset   Arthritis/Rheumatoid Mother        died in age 8s   Breast cancer Mother 61   Breast cancer Maternal Aunt        X 2. 50's   Breast cancer Maternal Aunt    Lung cancer Maternal Aunt    Breast cancer Maternal Aunt    Breast cancer Maternal Grandmother        40's   Pancreatitis Neg Hx    Colon cancer Neg Hx    Past Surgical History:  Procedure Laterality Date    ABDOMINAL HYSTERECTOMY  2006   BLADDER REPAIR     BREAST CYST ASPIRATION Left yrs ago   [place]   COLONOSCOPY N/A 10/16/2021   Procedure: COLONOSCOPY;  Surgeon: Robert Bellow, MD;  Location: [facility] ENDOSCOPY;  Service: Endoscopy;  Laterality: N/A;   COLONOSCOPY WITH ESOPHAGOGASTRODUODENOSCOPY (EGD)     COLONOSCOPY WITH PROPOFOL N/A 08/03/2017   Procedure: COLONOSCOPY WITH PROPOFOL;  Surgeon: Lollie Sails, MD;  Location: [facility] ENDOSCOPY;  Service: Endoscopy;  Laterality: N/A;   COLONOSCOPY WITH PROPOFOL N/A 10/18/2018   Procedure: COLONOSCOPY WITH PROPOFOL;  Surgeon: Lollie Sails, MD;  Location[facility] ENDOSCOPY;  Service: Endoscopy;  Laterality: N/A;   ESOPHAGOGASTRODUODENOSCOPY N/A 10/07/2013   Procedure: ESOPHAGOGASTRODUODENOSCOPY (EGD);  Surgeon: Danie Binder, MD;  Location: [facility];  Service: Endoscopy;  Laterality: N/A;  patient received heparin at 530am given phenergan 97m IV 30 minutes before   ESOPHAGOGASTRODUODENOSCOPY     x4   EYE SURGERY  lacrimal gland    FLEXIBLE BRONCHOSCOPY N/A 06/04/2016   Procedure: FLEXIBLE BRONCHOSCOPY;  Surgeon: Allyne Gee, MD;  Location: [facility] ORS;  Service: Pulmonary;  Laterality: N/A;   INCONTINENCE SURGERY  2004   NASAL SINUS SURGERY     THYROID LOBECTOMY     TOTAL THYROIDECTOMY     Social History   Social History Narrative   Not on file   Immunization History  Administered Date(s) Administered   Influenza Inj Mdck Quad Pf 07/22/2018, 07/05/2019, 06/27/2020, 07/19/2021   Influenza-Unspecified 08/17/2017   Moderna Sars-Covid-2 Vaccination 12/03/2019, 01/24/2020, 08/13/2020, 05/15/2021   Pneumococcal Conjugate-13 10/18/2020   Tdap 07/27/2019   Zoster Recombinat (Shingrix) 08/21/2017, 12/08/2017     Objective: Vital Signs: BP 115/76 (BP Location: Left Arm, Patient Position: Sitting, Cuff Size: Normal)   Pulse 76   Resp 16   Ht _0  (1.651 m)   Wt 184 lb (83.5 kg)   BMI 30.62 kg/m     Physical Exam Vitals and nursing note reviewed.  Constitutional:      Appearance: She is well-developed.  HENT:     Head: Normocephalic and atraumatic.  Eyes:     Conjunctiva/sclera: Conjunctivae normal.  Cardiovascular:     Rate and Rhythm: Normal rate and regular rhythm.     Heart sounds: Normal heart sounds.  Pulmonary:     Effort: Pulmonary effort is normal.     Breath sounds: Normal breath sounds.  Abdominal:     General: Bowel sounds are normal.     Palpations: Abdomen is soft.  Musculoskeletal:     Cervical back: Normal range of motion.  Lymphadenopathy:     Cervical: No cervical adenopathy.  Skin:    General: Skin is warm and dry.     Capillary Refill: Capillary refill takes less than 2 seconds.  Neurological:     Mental Status: She is alert and oriented to person, place, and time.  Psychiatric:        Behavior: Behavior normal.      Musculoskeletal Exam: She had good range of motion of the cervical spine.  She has some discomfort range of motion of the lumbar spine with some tenderness over left lower lumbar region.  There was no point tenderness over the vertebral spine.  Shoulders, elbows, wrists, MCPs been good range of motion.  She had bilateral CMC, PIP and DIP thickening consistent with osteoarthritis.  Hip joints were in good range of motion.  She had good range of motion of bilateral knee joints.  She had good range of motion bilateral ankles without any tenderness.  There was no tenderness over MTPs.  CDAI Exam: CDAI Score: -- Patient Global: --; Provider Global: -- Swollen: --; Tender: -- Joint Exam 09/30/2022   No joint exam has been documented for this visit   There is currently no information documented on the homunculus. Go to the Rheumatology activity and complete the homunculus joint exam.  Investigation: No additional findings.  Imaging: No results found.  Recent Labs: Lab Results  Component Value Date   WBC 5.5 02/27/2022   HGB 13.2  02/27/2022   PLT 358 02/27/2022   NA 139 02/27/2022   K 4.4 02/27/2022   CL 105 02/27/2022   CO2 23 02/27/2022   GLUCOSE 85 02/27/2022   BUN 15 02/27/2022   CREATININE 0.92 02/27/2022   BILITOT 0.3 02/27/2022   ALKPHOS 79 08/22/2020   AST 21 02/27/2022   ALT 29 02/27/2022   PROT 7.1 02/27/2022   PROT  7.1 02/27/2022   ALBUMIN 4.4 08/22/2020   CALCIUM 9.5 02/27/2022   GFRAA 80 03/06/2021    Speciality Comments: PLQ Eye Exam: 09/30/2021 WNL @ [facility] Follow up in 1 year  Procedures:  No procedures performed Allergies: Aspirin, Keflex [cephalexin], Azithromycin, Barley grass, Clarithromycin, Metoclopramide, Oat, Oatmeal, Other, Rye grass flower pollen extract [gramineae pollens], Vioxx [rofecoxib], Wheat bran, Wheat extract, Celebrex [celecoxib], and Metoclopramide hcl   Assessment / Plan:     Visit Diagnoses: Sjogren's syndrome with other organ involvement ([place]) - Diagnosed at [facility] 12 years ago and was placed on Plaquenil.  Hx of dry mouth, dental decay and dry eyes.  ANA positive, Ro negative, La negative, RF negative: -She continues to have dry mouth and dry eye symptoms.  She has been using over-the-counter products.  She has been seeing ophthalmologist and dentist on a regular basis.  She continues to be on hydroxychloroquine 200 mg p.o. twice daily which she is tolerating well.  She has an eye examination coming up.  She has underlying COPD and gets shortness of breath from it.  She denies any increased shortness of breath.  Labs obtained on Feb 27, 2022 were reviewed.  All autoimmune work-up was negative except for positive ANA.  I will obtain labs today.  Plan: Urinalysis, Routine w reflex microscopic, Anti-DNA antibody, double-stranded, C3 and C4, Sedimentation rate  High risk medication use - Plaquenil 200 mg 1 tablet by mouth twice daily.  She restarted plaquenil in May 2022. PLQ Eye Exam: 09/30/2021 - Plan: CBC with Differential/Platelet, COMPLETE METABOLIC PANEL WITH  GFR today.  Chronic right shoulder pain -she gives history of intermittent right shoulder joint pain.  She had good range of motion of the right shoulder joint.  X-ray of the shoulder joint was unremarkable.  Primary osteoarthritis of both hands-she continues to have pain and discomfort in her bilateral hands.  She has bilateral PIP and DIP thickening.  Joint protection muscle strengthening was discussed.  A handout on hand exercises was given.  Primary osteoarthritis of both knees-she complains of knee joint pain and discomfort.  No warmth swelling or effusion was noted.  Her knee joints were in good range of motion.  Primary osteoarthritis of both feet-proper fitting shoes were advised.  Chronic left-sided lower back pain without sciatica-patient states she started having some lower back pain after swimming in [place] few months back.  She describes discomfort in the left lower lumbar region.  She had no point tenderness over the lumbar spine.  A handout on back exercises was given.  If her symptoms do not improve then we can consider physical therapy.  Fibromyalgia-she continues to have generalized pain and arthralgias.  Need for regular exercise and stretching was discussed.  Other medical problems listed as follows:  Chronic pain syndrome-she is on pain medications.  CD (celiac disease)  Nutcracker esophagus  Esophageal spasm  History of gastroesophageal reflux (GERD)  History of thyroid cancer  MDD (major depressive disorder), recurrent, in partial remission ([facility])  Chronic obstructive pulmonary disease, unspecified COPD type ([place])  Generalized anxiety disorder  History of asthma  Family history of rheumatoid arthritis  Wandra Feinstein has been experiencing new onset vertigo for the last week.  She will be seeing her PCP tomorrow for evaluation.  Orders: Orders Placed This Encounter  Procedures   Urinalysis, Routine w reflex microscopic   CBC with Differential/Platelet    COMPLETE METABOLIC PANEL WITH GFR   Anti-DNA antibody, double-stranded   C3 and C4  Sedimentation rate   No orders of the defined types were placed in this encounter.    Follow-Up Instructions: Return in about 5 months (around 03/01/2023) for Sjogren's, Osteoarthritis.   Bo Merino, MD  Note - This record has been created using Editor, commissioning.  Chart creation errors have been sought, but may not always  have been located. Such creation errors do not reflect on  the standard of medical care.

## 2022-09-22 ENCOUNTER — Other Ambulatory Visit: Payer: Self-pay

## 2022-09-22 ENCOUNTER — Other Ambulatory Visit: Payer: Self-pay | Admitting: Nurse Practitioner

## 2022-09-22 ENCOUNTER — Other Ambulatory Visit: Payer: Self-pay | Admitting: Internal Medicine

## 2022-09-22 DIAGNOSIS — E785 Hyperlipidemia, unspecified: Secondary | ICD-10-CM

## 2022-09-22 DIAGNOSIS — Z760 Encounter for issue of repeat prescription: Secondary | ICD-10-CM

## 2022-09-22 MED ORDER — EZETIMIBE 10 MG PO TABS: 10.0000 mg | ORAL_TABLET | Freq: Every day | ORAL | 1 refills | Status: DC

## 2022-09-22 MED ORDER — VITAMIN D (ERGOCALCIFEROL) 1.25 MG (50000 UNIT) PO CAPS: 50000.0000 [IU] | ORAL_CAPSULE | ORAL | 1 refills | Status: DC

## 2022-09-24 DIAGNOSIS — G250 Essential tremor: Secondary | ICD-10-CM | POA: Diagnosis not present

## 2022-09-24 DIAGNOSIS — G43719 Chronic migraine without aura, intractable, without status migrainosus: Secondary | ICD-10-CM | POA: Diagnosis not present

## 2022-09-24 DIAGNOSIS — M069 Rheumatoid arthritis, unspecified: Secondary | ICD-10-CM | POA: Diagnosis not present

## 2022-09-24 DIAGNOSIS — M545 Low back pain, unspecified: Secondary | ICD-10-CM | POA: Diagnosis not present

## 2022-09-30 ENCOUNTER — Ambulatory Visit: Payer: Medicare HMO | Attending: Rheumatology | Admitting: Rheumatology

## 2022-09-30 ENCOUNTER — Encounter: Payer: Self-pay | Admitting: Rheumatology

## 2022-09-30 VITALS — BP 115/76 | HR 76 | Resp 16 | Ht 65.0 in | Wt 184.0 lb

## 2022-09-30 DIAGNOSIS — K900 Celiac disease: Secondary | ICD-10-CM

## 2022-09-30 DIAGNOSIS — M170 Bilateral primary osteoarthritis of knee: Secondary | ICD-10-CM | POA: Diagnosis not present

## 2022-09-30 DIAGNOSIS — G894 Chronic pain syndrome: Secondary | ICD-10-CM

## 2022-09-30 DIAGNOSIS — Z79899 Other long term (current) drug therapy: Secondary | ICD-10-CM

## 2022-09-30 DIAGNOSIS — Z8261 Family history of arthritis: Secondary | ICD-10-CM

## 2022-09-30 DIAGNOSIS — Z8709 Personal history of other diseases of the respiratory system: Secondary | ICD-10-CM

## 2022-09-30 DIAGNOSIS — Z85850 Personal history of malignant neoplasm of thyroid: Secondary | ICD-10-CM

## 2022-09-30 DIAGNOSIS — M797 Fibromyalgia: Secondary | ICD-10-CM | POA: Diagnosis not present

## 2022-09-30 DIAGNOSIS — R42 Dizziness and giddiness: Secondary | ICD-10-CM

## 2022-09-30 DIAGNOSIS — M19041 Primary osteoarthritis, right hand: Secondary | ICD-10-CM | POA: Diagnosis not present

## 2022-09-30 DIAGNOSIS — Z8719 Personal history of other diseases of the digestive system: Secondary | ICD-10-CM

## 2022-09-30 DIAGNOSIS — M19071 Primary osteoarthritis, right ankle and foot: Secondary | ICD-10-CM

## 2022-09-30 DIAGNOSIS — F411 Generalized anxiety disorder: Secondary | ICD-10-CM

## 2022-09-30 DIAGNOSIS — M25511 Pain in right shoulder: Secondary | ICD-10-CM

## 2022-09-30 DIAGNOSIS — M19042 Primary osteoarthritis, left hand: Secondary | ICD-10-CM

## 2022-09-30 DIAGNOSIS — J449 Chronic obstructive pulmonary disease, unspecified: Secondary | ICD-10-CM

## 2022-09-30 DIAGNOSIS — M545 Low back pain, unspecified: Secondary | ICD-10-CM

## 2022-09-30 DIAGNOSIS — M19072 Primary osteoarthritis, left ankle and foot: Secondary | ICD-10-CM

## 2022-09-30 DIAGNOSIS — K224 Dyskinesia of esophagus: Secondary | ICD-10-CM

## 2022-09-30 DIAGNOSIS — M3509 Sicca syndrome with other organ involvement: Secondary | ICD-10-CM | POA: Diagnosis not present

## 2022-09-30 DIAGNOSIS — G8929 Other chronic pain: Secondary | ICD-10-CM

## 2022-09-30 DIAGNOSIS — F3341 Major depressive disorder, recurrent, in partial remission: Secondary | ICD-10-CM

## 2022-09-30 NOTE — Patient Instructions (Addendum)
Vaccines You are taking a medication(s) that can suppress your immune system.  The following immunizations are recommended: Flu annually Covid-19  Td/Tdap (tetanus, diphtheria, pertussis) every 10 years Pneumonia (Prevnar 15 then Pneumovax 23 at least 1 year apart.  Alternatively, can take Prevnar 20 without needing additional dose) Shingrix: 2 doses from 4 weeks to 6 months apart  Please check with your PCP to make sure you are up to date.   Back Exercises The following exercises strengthen the muscles that help to support the trunk (torso) and back. They also help to keep the lower back flexible. Doing these exercises can help to prevent or lessen existing low back pain. If you have back pain or discomfort, try doing these exercises 2-3 times each day or as told by your health care provider. As your pain improves, do them once each day, but increase the number of times that you repeat the steps for each exercise (do more repetitions). To prevent the recurrence of back pain, continue to do these exercises once each day or as told by your health care provider. Do exercises exactly as told by your health care provider and adjust them as directed. It is normal to feel mild stretching, pulling, tightness, or discomfort as you do these exercises, but you should stop right away if you feel sudden pain or your pain gets worse. Exercises Single knee to chest Repeat these steps 3-5 times for each leg: Lie on your back on a firm bed or the floor with your legs extended. Bring one knee to your chest. Your other leg should stay extended and in contact with the floor. Hold your knee in place by grabbing your knee or thigh with both hands and hold. Pull on your knee until you feel a gentle stretch in your lower back or buttocks. Hold the stretch for 10-30 seconds. Slowly release and straighten your leg.  Pelvic tilt Repeat these steps 5-10 times: Lie on your back on a firm bed or the floor with your  legs extended. Bend your knees so they are pointing toward the ceiling and your feet are flat on the floor. Tighten your lower abdominal muscles to press your lower back against the floor. This motion will tilt your pelvis so your tailbone points up toward the ceiling instead of pointing to your feet or the floor. With gentle tension and even breathing, hold this position for 5-10 seconds.  Cat-cow Repeat these steps until your lower back becomes more flexible: Get into a hands-and-knees position on a firm bed or the floor. Keep your hands under your shoulders, and keep your knees under your hips. You may place padding under your knees for comfort. Let your head hang down toward your chest. Contract your abdominal muscles and point your tailbone toward the floor so your lower back becomes rounded like the back of a cat. Hold this position for 5 seconds. Slowly lift your head, let your abdominal muscles relax, and point your tailbone up toward the ceiling so your back forms a sagging arch like the back of a cow. Hold this position for 5 seconds.  Press-ups Repeat these steps 5-10 times: Lie on your abdomen (face-down) on a firm bed or the floor. Place your palms near your head, about shoulder-width apart. Keeping your back as relaxed as possible and keeping your hips on the floor, slowly straighten your arms to raise the top half of your body and lift your shoulders. Do not use your back muscles to raise your  upper torso. You may adjust the placement of your hands to make yourself more comfortable. Hold this position for 5 seconds while you keep your back relaxed. Slowly return to lying flat on the floor.  Bridges Repeat these steps 10 times: Lie on your back on a firm bed or the floor. Bend your knees so they are pointing toward the ceiling and your feet are flat on the floor. Your arms should be flat at your sides, next to your body. Tighten your buttocks muscles and lift your buttocks off  the floor until your waist is at almost the same height as your knees. You should feel the muscles working in your buttocks and the back of your thighs. If you do not feel these muscles, slide your feet 1-2 inches (2.5-5 cm) farther away from your buttocks. Hold this position for 3-5 seconds. Slowly lower your hips to the starting position, and allow your buttocks muscles to relax completely. If this exercise is too easy, try doing it with your arms crossed over your chest. Abdominal crunches Repeat these steps 5-10 times: Lie on your back on a firm bed or the floor with your legs extended. Bend your knees so they are pointing toward the ceiling and your feet are flat on the floor. Cross your arms over your chest. Tip your chin slightly toward your chest without bending your neck. Tighten your abdominal muscles and slowly raise your torso high enough to lift your shoulder blades a tiny bit off the floor. Avoid raising your torso higher than that because it can put too much stress on your lower back and does not help to strengthen your abdominal muscles. Slowly return to your starting position.  Back lifts Repeat these steps 5-10 times: Lie on your abdomen (face-down) with your arms at your sides, and rest your forehead on the floor. Tighten the muscles in your legs and your buttocks. Slowly lift your chest off the floor while you keep your hips pressed to the floor. Keep the back of your head in line with the curve in your back. Your eyes should be looking at the floor. Hold this position for 3-5 seconds. Slowly return to your starting position.  Contact a health care provider if: Your back pain or discomfort gets much worse when you do an exercise. Your worsening back pain or discomfort does not lessen within 2 hours after you exercise. If you have any of these problems, stop doing these exercises right away. Do not do them again unless your health care provider says that you can. Get help  right away if: You develop sudden, severe back pain. If this happens, stop doing the exercises right away. Do not do them again unless your health care provider says that you can. This information is not intended to replace advice given to you by your health care provider. Make sure you discuss any questions you have with your health care provider. Document Revised: 04/09/2021 Document Reviewed: 12/26/2020 [employer] Patient Education  [place].  Hand Exercises Hand exercises can be helpful for almost anyone. These exercises can strengthen the hands, improve flexibility and movement, and increase blood flow to the hands. These results can make work and daily tasks easier. Hand exercises can be especially helpful for people who have joint pain from arthritis or have nerve damage from overuse (carpal tunnel syndrome). These exercises can also help people who have injured a hand. Exercises Most of these hand exercises are gentle stretching and motion exercises. It is  usually safe to do them often throughout the day. Warming up your hands before exercise may help to reduce stiffness. You can do this with gentle massage or by placing your hands in warm water for 10-15 minutes. It is normal to feel some stretching, pulling, tightness, or mild discomfort as you begin new exercises. This will gradually improve. Stop an exercise right away if you feel sudden, severe pain or your pain gets worse. Ask your health care provider which exercises are best for you. Knuckle bend or "claw" fist  Stand or sit with your arm, hand, and all five fingers pointed straight up. Make sure to keep your wrist straight during the exercise. Gently bend your fingers down toward your palm until the tips of your fingers are touching the top of your palm. Keep your big knuckle straight and just bend the small knuckles in your fingers. Hold this position for __________ seconds. Straighten (extend) your fingers back to the  starting position. Repeat this exercise 5-10 times with each hand. Full finger fist  Stand or sit with your arm, hand, and all five fingers pointed straight up. Make sure to keep your wrist straight during the exercise. Gently bend your fingers into your palm until the tips of your fingers are touching the middle of your palm. Hold this position for __________ seconds. Extend your fingers back to the starting position, stretching every joint fully. Repeat this exercise 5-10 times with each hand. Straight fist Stand or sit with your arm, hand, and all five fingers pointed straight up. Make sure to keep your wrist straight during the exercise. Gently bend your fingers at the big knuckle, where your fingers meet your hand, and the middle knuckle. Keep the knuckle at the tips of your fingers straight and try to touch the bottom of your palm. Hold this position for __________ seconds. Extend your fingers back to the starting position, stretching every joint fully. Repeat this exercise 5-10 times with each hand. Tabletop  Stand or sit with your arm, hand, and all five fingers pointed straight up. Make sure to keep your wrist straight during the exercise. Gently bend your fingers at the big knuckle, where your fingers meet your hand, as far down as you can while keeping the small knuckles in your fingers straight. Think of forming a tabletop with your fingers. Hold this position for __________ seconds. Extend your fingers back to the starting position, stretching every joint fully. Repeat this exercise 5-10 times with each hand. Finger spread  Place your hand flat on a table with your palm facing down. Make sure your wrist stays straight as you do this exercise. Spread your fingers and thumb apart from each other as far as you can until you feel a gentle stretch. Hold this position for __________ seconds. Bring your fingers and thumb tight together again. Hold this position for __________  seconds. Repeat this exercise 5-10 times with each hand. Making circles  Stand or sit with your arm, hand, and all five fingers pointed straight up. Make sure to keep your wrist straight during the exercise. Make a circle by touching the tip of your thumb to the tip of your index finger. Hold for __________ seconds. Then open your hand wide. Repeat this motion with your thumb and each finger on your hand. Repeat this exercise 5-10 times with each hand. Thumb motion  Sit with your forearm resting on a table and your wrist straight. Your thumb should be facing up toward the ceiling. Keep  your fingers relaxed as you move your thumb. Lift your thumb up as high as you can toward the ceiling. Hold for __________ seconds. Bend your thumb across your palm as far as you can, reaching the tip of your thumb for the small finger (pinkie) side of your palm. Hold for __________ seconds. Repeat this exercise 5-10 times with each hand. Grip strengthening  Hold a stress ball or other soft ball in the middle of your hand. Slowly increase the pressure, squeezing the ball as much as you can without causing pain. Think of bringing the tips of your fingers into the middle of your palm. All of your finger joints should bend when doing this exercise. Hold your squeeze for __________ seconds, then relax. Repeat this exercise 5-10 times with each hand. Contact a health care provider if: Your hand pain or discomfort gets much worse when you do an exercise. Your hand pain or discomfort does not improve within 2 hours after you exercise. If you have any of these problems, stop doing these exercises right away. Do not do them again unless your health care provider says that you can. Get help right away if: You develop sudden, severe hand pain or swelling. If this happens, stop doing these exercises right away. Do not do them again unless your health care provider says that you can. This information is not intended to  replace advice given to you by your health care provider. Make sure you discuss any questions you have with your health care provider. Document Revised: 01/31/2021 Document Reviewed: 01/31/2021 Else[employer] Patient Education  [place].

## 2022-10-01 ENCOUNTER — Encounter: Payer: Self-pay | Admitting: Nurse Practitioner

## 2022-10-01 ENCOUNTER — Ambulatory Visit (INDEPENDENT_AMBULATORY_CARE_PROVIDER_SITE_OTHER): Payer: Medicare HMO | Admitting: Nurse Practitioner

## 2022-10-01 VITALS — BP 126/76 | HR 76 | Temp 98.0°F | Resp 16 | Ht 65.0 in | Wt 185.8 lb

## 2022-10-01 DIAGNOSIS — M797 Fibromyalgia: Secondary | ICD-10-CM | POA: Diagnosis not present

## 2022-10-01 DIAGNOSIS — Z760 Encounter for issue of repeat prescription: Secondary | ICD-10-CM | POA: Diagnosis not present

## 2022-10-01 DIAGNOSIS — R42 Dizziness and giddiness: Secondary | ICD-10-CM

## 2022-10-01 LAB — COMPLETE METABOLIC PANEL WITH GFR
AG Ratio: 1.4 (calc) (ref 1.0–2.5)
ALT: 21 U/L (ref 6–29)
AST: 17 U/L (ref 10–35)
Albumin: 4.4 g/dL (ref 3.6–5.1)
Alkaline phosphatase (APISO): 68 U/L (ref 37–153)
BUN: 15 mg/dL (ref 7–25)
CO2: 21 mmol/L (ref 20–32)
Calcium: 9.4 mg/dL (ref 8.6–10.4)
Chloride: 108 mmol/L (ref 98–110)
Creat: 0.87 mg/dL (ref 0.50–1.05)
Globulin: 3.1 g/dL (calc) (ref 1.9–3.7)
Glucose, Bld: 89 mg/dL (ref 65–99)
Potassium: 4.4 mmol/L (ref 3.5–5.3)
Sodium: 139 mmol/L (ref 135–146)
Total Bilirubin: 0.2 mg/dL (ref 0.2–1.2)
Total Protein: 7.5 g/dL (ref 6.1–8.1)
eGFR: 73 mL/min/{1.73_m2} (ref >=60)

## 2022-10-01 LAB — CBC WITH DIFFERENTIAL/PLATELET
Absolute Monocytes: 454 cells/uL (ref 200–950)
Basophils Absolute: 51 cells/uL (ref 0–200)
Basophils Relative: 0.8 %
Eosinophils Absolute: 294 cells/uL (ref 15–500)
Eosinophils Relative: 4.6 %
HCT: 37.8 % (ref 35.0–45.0)
Hemoglobin: 13.1 g/dL (ref 11.7–15.5)
Lymphs Abs: 2381 cells/uL (ref 850–3900)
MCH: 30.3 pg (ref 27.0–33.0)
MCHC: 34.7 g/dL (ref 32.0–36.0)
MCV: 87.3 fL (ref 80.0–100.0)
MPV: 9.6 fL (ref 7.5–12.5)
Monocytes Relative: 7.1 %
Neutro Abs: 3219 cells/uL (ref 1500–7800)
Neutrophils Relative %: 50.3 %
Platelets: 346 10*3/uL (ref 140–400)
RBC: 4.33 10*6/uL (ref 3.80–5.10)
RDW: 12.2 % (ref 11.0–15.0)
Total Lymphocyte: 37.2 %
WBC: 6.4 10*3/uL (ref 3.8–10.8)

## 2022-10-01 LAB — URINALYSIS, ROUTINE W REFLEX MICROSCOPIC
Bacteria, UA: NONE SEEN /HPF
Bilirubin Urine: NEGATIVE
Glucose, UA: NEGATIVE
Hgb urine dipstick: NEGATIVE
Hyaline Cast: NONE SEEN /LPF
Ketones, ur: NEGATIVE
Nitrite: NEGATIVE
Protein, ur: NEGATIVE
RBC / HPF: NONE SEEN /HPF (ref 0–2)
Specific Gravity, Urine: 1.013 (ref 1.001–1.035)
Squamous Epithelial / HPF: NONE SEEN /HPF (ref <=5)
pH: 5.5 (ref 5.0–8.0)

## 2022-10-01 LAB — ANTI-DNA ANTIBODY, DOUBLE-STRANDED: ds DNA Ab: <1 IU/mL

## 2022-10-01 LAB — C3 AND C4
C3 Complement: 177 mg/dL (ref 83–193)
C4 Complement: 28 mg/dL (ref 15–57)

## 2022-10-01 LAB — SEDIMENTATION RATE: Sed Rate: 11 mm/h (ref 0–30)

## 2022-10-01 LAB — MICROSCOPIC MESSAGE

## 2022-10-01 MED ORDER — HYDROCODONE-ACETAMINOPHEN 7.5-325 MG PO TABS: ORAL_TABLET | ORAL | 0 refills | Status: DC

## 2022-10-01 MED ORDER — FOLIC ACID 1 MG PO TABS: 1.0000 mg | ORAL_TABLET | Freq: Every day | ORAL | 1 refills | Status: DC

## 2022-10-01 MED ORDER — ALPRAZOLAM 0.5 MG PO TABS: 0.5000 mg | ORAL_TABLET | Freq: Three times a day (TID) | ORAL | 0 refills | Status: DC | PRN

## 2022-10-01 MED ORDER — GABAPENTIN 600 MG PO TABS: ORAL_TABLET | ORAL | 3 refills | Status: DC

## 2022-10-01 NOTE — Progress Notes (Signed)
[facility] [place], [place] [address]  Internal MEDICINE  Office Visit Note  Patient Name: Susan [name]  [id number]  [id number]  Date of Service: 10/01/2022  Chief Complaint  Patient presents with   Follow-up   Depression   Gastroesophageal Reflux   Hyperlipidemia    HPI Susan [name] presents for a follow up visit for medication refills, dizziness  Having issues with balance and gait for the past 2-3 weeks Already has neurologist  Tried epley maneuver and dramamine helps.    Current Medication: Outpatient Encounter Medications as of 10/01/2022  Medication Sig   acetaminophen (TYLENOL) 500 MG tablet Take by mouth as needed.   albuterol (PROVENTIL) (2.5 MG/3ML) 0.083% nebulizer solution Take 3 mLs (2.5 mg total) by nebulization every 6 (six) hours as needed for wheezing or shortness of breath.   albuterol (VENTOLIN HFA) 108 (90 Base) MCG/ACT inhaler Inhale 2 puffs into the lungs every 6 (six) hours as needed for wheezing or shortness of breath.   amitriptyline (ELAVIL) 25 MG tablet Take 1 tablet (25 mg total) by mouth at bedtime.   Ascorbic Acid (VITAMIN C) 100 MG tablet Take 100 mg by mouth daily.    azelastine (ASTELIN) 0.1 % nasal spray Place 2 sprays into both nostrils 2 (two) times daily. Use in each nostril as directed   carisoprodol (SOMA) 350 MG tablet Take 350 mg by mouth at bedtime. Patient states that she takes 1/2 tablet nightly    cyanocobalamin (,VITAMIN B-12,) 1000 MCG/ML injection Inject 1 mL (1,000 mcg total) into the muscle every 30 (thirty) days.   Dentifrices (BIOTENE DRY MOUTH DT) by Transmucosal route.   eletriptan (RELPAX) 40 MG tablet Take 40 mg by mouth as needed for migraine. may repeat in 2 hours if necessary    EPINEPHRINE 0.3 mg/0.3 mL IJ SOAJ injection INJECT CONTENTS OF 1 PEN AS NEEDED FOR ALLERGIC REACTION   estradiol (CLIMARA) 0.1 mg/24hr patch APPLY 1 PATCH TO SKIN ONCE WEEKLY   Estradiol (YUVAFEM) 10 MCG TABS vaginal tablet  Place 1 tablet (10 mcg total) vaginally 2 (two) times a week.   ezetimibe (ZETIA) 10 MG tablet Take 1 tablet (10 mg total) by mouth daily.   fenofibrate (TRICOR) 145 MG tablet Take 1 tablet (145 mg total) by mouth daily.   fluticasone (FLONASE) 50 MCG/ACT nasal spray Place 2 sprays into both nostrils daily.   fluticasone furoate-vilanterol (BREO ELLIPTA) 100-25 MCG/ACT AEPB Inhale 1 puff into the lungs daily.   hydroxychloroquine (PLAQUENIL) 200 MG tablet Take 1 tablet by mouth twice daily   hyoscyamine (ANASPAZ) 0.125 MG TBDP disintergrating tablet Place 0.125 mg under the tongue every 4 (four) hours as needed for bladder spasms.    Lactobacillus (DIGESTIVE HEALTH PROBIOTIC PO) Take 1 tablet by mouth daily.    levothyroxine (SYNTHROID) 125 MCG tablet Take 1 tablet (125 mcg total) by mouth daily.   LINZESS 290 MCG CAPS capsule Take 1 capsule (290 mcg total) by mouth daily.   Magnesium Oxide (NATRUL MAGNESIUM PO) Take by mouth.    montelukast (SINGULAIR) 10 MG tablet TAKE 1 TABLET BY MOUTH ONCE DAILY FOR ASTHMA   Multiple Vitamins-Minerals (ZINC PO) Take by mouth daily.   mupirocin ointment (BACTROBAN) 2 % as needed.   pantoprazole (PROTONIX) 40 MG tablet Take 40 mg by mouth daily.    Polyethyl Glycol-Propyl Glycol 0.4-0.3 % SOLN Apply to eye.    Probiotic Product ([place]) CAPS Take by mouth.    sodium chloride (OCEAN) 0.65 %  SOLN nasal spray Place 1 spray into both nostrils as needed for congestion.    sodium chloride 0.9 % nebulizer solution    SUMAtriptan-naproxen (TREXIMET) 85-500 MG tablet daily as needed.    Syringe/Needle, Disp, (SYRINGE 3CC/25GX5/8") 25G X 5/8" 3 ML MISC Use as directed once a month   Tuberculin-Allergy Syringes 28G X 1/2" 1 ML MISC 1 each by Does not apply route every 14 (fourteen) days. For allergy injections   venlafaxine XR (EFFEXOR-XR) 150 MG 24 hr capsule Take 2 capsules (300 mg total) by mouth at bedtime.   Vitamin D, Ergocalciferol, (DRISDOL) 1.25  MG (50000 UNIT) CAPS capsule Take 1 capsule (50,000 Units total) by mouth every 7 (seven) days. Monday   vitamin E 1000 UNIT capsule Take by mouth.    [DISCONTINUED] ALPRAZolam (XANAX) 0.5 MG tablet TAKE 1 TABLET BY MOUTH THREE TIMES DAILY AS NEEDED FOR ANXIETY   [DISCONTINUED] folic acid (FOLVITE) 1 MG tablet Take 1 tablet (1 mg total) by mouth daily.   [DISCONTINUED] gabapentin (NEURONTIN) 600 MG tablet Take 1/2 (one-half) tablet by mouth twice daily   [DISCONTINUED] HYDROcodone-acetaminophen (NORCO) 7.5-325 MG tablet One tab po tid for pain prn only   ALPRAZolam (XANAX) 0.5 MG tablet Take 1 tablet (0.5 mg total) by mouth 3 (three) times daily as needed. for anxiety   folic acid (FOLVITE) 1 MG tablet Take 1 tablet (1 mg total) by mouth daily.   gabapentin (NEURONTIN) 600 MG tablet Take 1/2 (one-half) tablet by mouth daily   HYDROcodone-acetaminophen (NORCO) 7.5-325 MG tablet One tab po tid for pain prn only   No facility-administered encounter medications on file as of 10/01/2022.    Surgical History: Past Surgical History:  Procedure Laterality Date   ABDOMINAL HYSTERECTOMY  2006   BLADDER REPAIR     BREAST CYST ASPIRATION Left yrs ago   [place]   COLONOSCOPY N/A 10/16/2021   Procedure: COLONOSCOPY;  Surgeon: Robert Bellow, MD;  Location: [facility] ENDOSCOPY;  Service: Endoscopy;  Laterality: N/A;   COLONOSCOPY WITH ESOPHAGOGASTRODUODENOSCOPY (EGD)     COLONOSCOPY WITH PROPOFOL N/A 08/03/2017   Procedure: COLONOSCOPY WITH PROPOFOL;  Surgeon: Lollie Sails, MD;  Location: [facility] ENDOSCOPY;  Service: Endoscopy;  Laterality: N/A;   COLONOSCOPY WITH PROPOFOL N/A 10/18/2018   Procedure: COLONOSCOPY WITH PROPOFOL;  Surgeon: Lollie Sails, MD;  Location: [facility] ENDOSCOPY;  Service: Endoscopy;  Laterality: N/A;   ESOPHAGOGASTRODUODENOSCOPY N/A 10/07/2013   Procedure: ESOPHAGOGASTRODUODENOSCOPY (EGD);  Surgeon: Danie Binder, MD;  Location: [facility] ENDO SUITE;  Service:  Endoscopy;  Laterality: N/A;  patient received heparin at 530am given phenergan 41m IV 30 minutes before   ESOPHAGOGASTRODUODENOSCOPY     x4   EYE SURGERY     lacrimal gland    FLEXIBLE BRONCHOSCOPY N/A 06/04/2016   Procedure: FLEXIBLE BRONCHOSCOPY;  Surgeon: SAllyne Gee MD;  Location: [facility] ORS;  Service: Pulmonary;  Laterality: N/A;   INCONTINENCE SURGERY  2004   NASAL SINUS SURGERY     THYROID LOBECTOMY     TOTAL THYROIDECTOMY      Medical History: Past Medical History:  Diagnosis Date   Arthritis    Asthma    Asthmatic bronchitis    BRCA negative 05/2014   MyRisk neg   Celiac disease    Celiac sprue    Chronic anxiety    Complication of anesthesia    vomiting   COPD (chronic obstructive pulmonary disease) (HCC)    Depression    Environmental allergies  Family history of breast cancer 05/2014   MyRisk neg; IBIS=29.5%   Fibromyalgia    neuropathy all over   Gastritis 10/08/2013   GERD (gastroesophageal reflux disease)    Hyperlipemia    Hyperlipidemia    Intolerant to statins   Hypothyroidism (acquired)    Increased risk of breast cancer 05/2014   IBIS=29.5%   Increased risk of breast cancer 05/2014   IBIS=29.5%   Migraine    Migraines    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     Family History: Family History  Problem Relation Age of Onset   Arthritis/Rheumatoid Mother        died in age 2s   Breast cancer Mother 47   Breast cancer Maternal Aunt        X 2. 50's   Breast cancer Maternal Aunt    Lung cancer Maternal Aunt    Breast cancer Maternal Aunt    Breast cancer Maternal Grandmother        40's   Pancreatitis Neg Hx    Colon cancer Neg Hx     Social History   Socioeconomic History   Marital status: Married    Spouse name: Not on file   Number of children: 0   Years of education: Not on file    Highest education level: Not on file  Occupational History   Not on file  Tobacco Use   Smoking status: Never    Passive exposure: Past   Smokeless tobacco: Never  Vaping Use   Vaping Use: Never used  Substance and Sexual Activity   Alcohol use: No   Drug use: No   Sexual activity: Yes    Birth control/protection: Surgical  Other Topics Concern   Not on file  Social History Narrative   Not on file   Social Determinants of Health   Financial Resource Strain: Low Risk  (03/07/2021)   Overall Financial Resource Strain (CARDIA)    Difficulty of Paying Living Expenses: Not very hard  Food Insecurity: Not on file  Transportation Needs: Not on file  Physical Activity: Not on file  Stress: Not on file  Social Connections: Not on file  Intimate Partner Violence: Not on file      Review of Systems  Constitutional:  Negative for chills, fatigue and unexpected weight change.  HENT:  Negative for congestion, rhinorrhea, sneezing and sore throat.   Eyes:  Negative for redness.  Respiratory:  Negative for cough, chest tightness and shortness of breath.   Cardiovascular:  Negative for chest pain and palpitations.  Gastrointestinal:  Negative for abdominal pain, constipation, diarrhea, nausea and vomiting.  Genitourinary:  Negative for dysuria and frequency.  Musculoskeletal:  Negative for arthralgias, back pain, joint swelling and neck pain.  Skin:  Negative for rash.  Neurological:  Positive for dizziness and light-headedness. Negative for tremors and numbness.  Hematological:  Negative for adenopathy. Does not bruise/bleed easily.  Psychiatric/Behavioral:  Positive for depression. Negative for behavioral problems (Depression), sleep disturbance and suicidal ideas. The patient is not nervous/anxious.     Vital Signs: BP 126/76   Pulse 76   Temp 98 F (36.7 C)   Resp 16   Ht _0  (1.651 m)   Wt 185 lb 12.8 oz (84.3 kg)   SpO2 99%   BMI 30.92 kg/m  Physical  Exam Vitals reviewed.  Constitutional:      General: She is not in acute distress.    Appearance: Normal appearance. She is normal weight. She is not ill-appearing.  HENT:     Head: Normocephalic and atraumatic.  Eyes:     Pupils: Pupils are equal, round, and reactive to light.  Cardiovascular:     Rate and Rhythm: Normal rate and regular rhythm.  Pulmonary:     Effort: Pulmonary effort is normal. No respiratory distress.  Neurological:     Mental Status: She is alert and oriented to person, place, and time.     Cranial Nerves: No cranial nerve deficit.     Coordination: Coordination normal.     Gait: Gait normal.  Psychiatric:        Mood and Affect: Mood normal.        Behavior: Behavior normal.        Assessment/Plan: 1. Vertigo Continue OTC dramamine as needed and epley maneuver as instructed.   2. Fibromyalgia Stable, continue gabapentin as prescribed.  - gabapentin (NEURONTIN) 600 MG tablet; Take 1/2 (one-half) tablet by mouth daily  Dispense: 45 tablet; Refill: 3  3. Medication refill - ALPRAZolam (XANAX) 0.5 MG tablet; Take 1 tablet (0.5 mg total) by mouth 3 (three) times daily as needed. for anxiety  Dispense: 90 tablet; Refill: 0 - HYDROcodone-acetaminophen (NORCO) 7.5-325 MG tablet; One tab po tid for pain prn only  Dispense: 30 tablet; Refill: 0 - gabapentin (NEURONTIN) 600 MG tablet; Take 1/2 (one-half) tablet by mouth daily  Dispense: 45 tablet; Refill: 3 - folic acid (FOLVITE) 1 MG tablet; Take 1 tablet (1 mg total) by mouth daily.  Dispense: 90 tablet; Refill: 1   General Counseling: dola lunsford understanding of the findings of todays visit and agrees with plan of treatment. I have discussed any further diagnostic evaluation that may be needed or ordered today. We also reviewed her medications today. she has been encouraged to call the office with any questions or concerns that should arise related to todays visit.    No orders of the defined types  were placed in this encounter.   Meds ordered this encounter  Medications   ALPRAZolam (XANAX) 0.5 MG tablet    Sig: Take 1 tablet (0.5 mg total) by mouth 3 (three) times daily as needed. for anxiety    Dispense:  90 tablet    Refill:  0   HYDROcodone-acetaminophen (NORCO) 7.5-325 MG tablet    Sig: One tab po tid for pain prn only    Dispense:  30 tablet    Refill:  0   gabapentin (NEURONTIN) 600 MG tablet    Sig: Take 1/2 (one-half) tablet by mouth daily    Dispense:  45 tablet    Refill:  3   folic acid (FOLVITE) 1 MG tablet    Sig: Take 1 tablet (1 mg total) by mouth daily.    Dispense:  90 tablet    Refill:  1    Return in about 6 months (around 04/02/2023) for previously scheduled, CPE, Miri Jose PCP.   Total time spent:30 Minutes Time spent includes review of chart, medications, test results, and follow up plan with the patient.   [place] Controlled Substance Database was reviewed by me.  This patient was seen by [name], FNP-C in collaboration with Dr. Clayborn Bigness as a part of collaborative care agreement.   Rockney Grenz R. Valetta Fuller, MSN, FNP-C Internal medicine

## 2022-10-01 NOTE — Progress Notes (Signed)
CBC, CMP, sed rate, double-stranded DNA, complements, UA are all within normal limits.

## 2022-10-02 ENCOUNTER — Telehealth: Payer: Self-pay

## 2022-10-02 DIAGNOSIS — Z79899 Other long term (current) drug therapy: Secondary | ICD-10-CM | POA: Diagnosis not present

## 2022-10-06 NOTE — Progress Notes (Unsigned)
Virtual Visit via Video Note  I connected with Susan [name] on 10/08/22 at  8:00 AM EST by a video enabled telemedicine application and verified that I am speaking with the correct person using two identifiers.  Location: Patient: home Provider: office Persons participated in the visit- patient, provider    I discussed the limitations of evaluation and management by telemedicine and the availability of in person appointments. The patient expressed understanding and agreed to proceed.    I discussed the assessment and treatment plan with the patient. The patient was provided an opportunity to ask questions and all were answered. The patient agreed with the plan and demonstrated an understanding of the instructions.   The patient was advised to call back or seek an in-person evaluation if the symptoms worsen or if the condition fails to improve as anticipated.  I provided 25 minutes of non-face-to-face time during this encounter.   [name], MD    [facility] MD/PA/NP OP Progress Note  10/08/2022 8:37 AM Susan [name]  MRN:  [id number]  Chief Complaint:  Chief Complaint  Patient presents with   Follow-up   HPI:  This is a follow-up appointment for depression and anxiety.  She states that she lost her friend.  She was suffering from cancer.  This is the second time she lost her friend this year.  It has been tough.  She is also planning to visit her friend in [place], who is suffering from COPD.  She had a good Thanksgiving at her brother's.  She has plans for Christmas.  She reports good relationship with her family.  She had vertigo, and has three falls.  She did contact her PCP and was prescribed medication, and she is trying to exercise to help this.  She has occasional pain secondary to arthritis.  She states that she has "brain fog."  She occasionally forgets the name of acquaintances.  She has difficulty in cooking at times, although she mostly attributes it to fatigue and  arthritis.  She has occasional difficulty with handwriting remote-control motor.  She is willing to try occupational therapy.  She denies feeling depressed.  She feels anxious at times when she is around with others, and when she was preparing for the funeral.  She has good appetite.  She denies SI.  She feels comfortable to stay on the current medication regimen at this time.   Functional Status Instrumental Activities of Daily Living (IADLs):  Susan [name] is independent in the following: medications, driving Requires assistance with the following: managing finances, cooking  Activities of Daily Living (ADLs):  Susan [name] is independent in the following: bathing and hygiene, feeding, continence, grooming and toileting, walking   Visit Diagnosis:    ICD-10-CM   1. MDD (major depressive disorder), recurrent, in full remission (Susan [name])  F33.42 EKG 12-Lead    TSH    amitriptyline (ELAVIL) 25 MG tablet    2. Cognitive decline  R41.89 TSH    B12 and Folate Panel    Ambulatory referral to Occupational Therapy    3. Generalized anxiety disorder  F41.1 amitriptyline (ELAVIL) 25 MG tablet      Past Psychiatric History: Please see initial evaluation for full details. I have reviewed the history. No updates at this time.     Past Medical History:  Past Medical History:  Diagnosis Date   Arthritis    Asthma    Asthmatic bronchitis    BRCA negative 05/2014   MyRisk neg  Celiac disease    Celiac sprue    Chronic anxiety    Complication of anesthesia    vomiting   COPD (chronic obstructive pulmonary disease) (HCC)    Depression    Environmental allergies    Family history of breast cancer 05/2014   MyRisk neg; IBIS=29.5%   Fibromyalgia    neuropathy all over   Gastritis 10/08/2013   GERD (gastroesophageal reflux disease)    Hyperlipemia    Hyperlipidemia    Intolerant to statins   Hypothyroidism (acquired)    Increased risk of breast cancer 05/2014   IBIS=29.5%    Increased risk of breast cancer 05/2014   IBIS=29.5%   Migraine    Migraines    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     Past Surgical History:  Procedure Laterality Date   ABDOMINAL HYSTERECTOMY  2006   BLADDER REPAIR     BREAST CYST ASPIRATION Left yrs ago   [place]   COLONOSCOPY N/A 10/16/2021   Procedure: COLONOSCOPY;  Surgeon: Robert Bellow, MD;  Location: [facility] ENDOSCOPY;  Service: Endoscopy;  Laterality: N/A;   COLONOSCOPY WITH ESOPHAGOGASTRODUODENOSCOPY (EGD)     COLONOSCOPY WITH PROPOFOL N/A 08/03/2017   Procedure: COLONOSCOPY WITH PROPOFOL;  Surgeon: Lollie Sails, MD;  Location: [facility] ENDOSCOPY;  Service: Endoscopy;  Laterality: N/A;   COLONOSCOPY WITH PROPOFOL N/A 10/18/2018   Procedure: COLONOSCOPY WITH PROPOFOL;  Surgeon: Lollie Sails, MD;  [facility];  Service: Endoscopy;  Laterality: N/A;   ESOPHAGOGASTRODUODENOSCOPY N/A 10/07/2013   Procedure: ESOPHAGOGASTRODUODENOSCOPY (EGD);  Surgeon: Danie Binder, MD;  [facility];  Service: Endoscopy;  Laterality: N/A;  patient received heparin at 530am given phenergan 28m IV 30 minutes before   ESOPHAGOGASTRODUODENOSCOPY     x4   EYE SURGERY     lacrimal gland    FLEXIBLE BRONCHOSCOPY N/A 06/04/2016   Procedure: FLEXIBLE BRONCHOSCOPY;  Surgeon: SAllyne Gee MD;  Location: [facility] ORS;  Service: Pulmonary;  Laterality: N/A;   INCONTINENCE SURGERY  2004   NASAL SINUS SURGERY     THYROID LOBECTOMY     TOTAL THYROIDECTOMY      Family Psychiatric History: Please see initial evaluation for full details. I have reviewed the history. No updates at this time.     Family History:  Family History  Problem Relation Age of Onset   Arthritis/Rheumatoid Mother        died in age 7329s  Breast cancer Mother 410  Breast cancer Maternal Aunt         X 2. 50's   Breast cancer Maternal Aunt    Lung cancer Maternal Aunt    Breast cancer Maternal Aunt    Breast cancer Maternal Grandmother        40's   Pancreatitis Neg Hx    Colon cancer Neg Hx     Social History:  Social History   Socioeconomic History   Marital status: Married    Spouse name: Not on file   Number of children: 0   Years of education: Not on file   Highest education level: Not on file  Occupational History   Not on file  Tobacco Use   Smoking status: Never    Passive exposure: Past   Smokeless tobacco: Never  Vaping Use   Vaping Use:  Never used  Substance and Sexual Activity   Alcohol use: No   Drug use: No   Sexual activity: Yes    Birth control/protection: Surgical  Other Topics Concern   Not on file  Social History Narrative   Not on file   Social Determinants of Health   Financial Resource Strain: Low Risk  (03/07/2021)   Overall Financial Resource Strain (CARDIA)    Difficulty of Paying Living Expenses: Not very hard  Food Insecurity: Not on file  Transportation Needs: Not on file  Physical Activity: Not on file  Stress: Not on file  Social Connections: Not on file    Allergies:  Allergies  Allergen Reactions   Aspirin    Keflex [Cephalexin] Shortness Of Breath   Azithromycin Hives and Nausea Only   Barley Grass    Clarithromycin Other (See Comments)   Metoclopramide Other (See Comments)    TREMORS Tremors Tremors    Oat Other (See Comments)   Oatmeal Other (See Comments)   Other     Other reaction(s): Other (See Comments) KEOPLEX.   Rye Grass Flower Pollen Extract [Gramineae Pollens]    Vioxx [Rofecoxib] Diarrhea   Wheat Bran Other (See Comments)    Celiac disease   Wheat Extract    Celebrex [Celecoxib] Hives   Metoclopramide Hcl Anxiety    BODY TREMORS    Metabolic Disorder Labs: No results found for: "HGBA1C", "MPG" No results found for: "PROLACTIN" Lab Results  Component Value Date   CHOL 188  04/16/2021   TRIG 135 04/16/2021   HDL 51 04/16/2021   CHOLHDL 3.7 04/16/2021   VLDL 22 03/23/2020   LDLCALC 113 (H) 04/16/2021   LDLCALC 118 (H) 03/23/2020   Lab Results  Component Value Date   TSH 2.640 09/05/2021   TSH 0.056 (L) 04/16/2021    Therapeutic Level Labs: No results found for: "LITHIUM" No results found for: "VALPROATE" No results found for: "CBMZ"  Current Medications: Current Outpatient Medications  Medication Sig Dispense Refill   acetaminophen (TYLENOL) 500 MG tablet Take by mouth as needed.     albuterol (PROVENTIL) (2.5 MG/3ML) 0.083% nebulizer solution Take 3 mLs (2.5 mg total) by nebulization every 6 (six) hours as needed for wheezing or shortness of breath. 75 mL 6   albuterol (VENTOLIN HFA) 108 (90 Base) MCG/ACT inhaler Inhale 2 puffs into the lungs every 6 (six) hours as needed for wheezing or shortness of breath. 1 each 2   ALPRAZolam (XANAX) 0.5 MG tablet Take 1 tablet (0.5 mg total) by mouth 3 (three) times daily as needed. for anxiety 90 tablet 0   [START ON 11/05/2022] amitriptyline (ELAVIL) 25 MG tablet Take 1 tablet (25 mg total) by mouth at bedtime. 90 tablet 1   Ascorbic Acid (VITAMIN C) 100 MG tablet Take 100 mg by mouth daily.      azelastine (ASTELIN) 0.1 % nasal spray Place 2 sprays into both nostrils 2 (two) times daily. Use in each nostril as directed     carisoprodol (SOMA) 350 MG tablet Take 350 mg by mouth at bedtime. Patient states that she takes 1/2 tablet nightly      cyanocobalamin (,VITAMIN B-12,) 1000 MCG/ML injection Inject 1 mL (1,000 mcg total) into the muscle every 30 (thirty) days. 1 mL 3   Dentifrices (BIOTENE DRY MOUTH DT) by Transmucosal route.     eletriptan (RELPAX) 40 MG tablet Take 40 mg by mouth as needed for migraine. may repeat in 2 hours if necessary  EPINEPHRINE 0.3 mg/0.3 mL IJ SOAJ injection INJECT CONTENTS OF 1 PEN AS NEEDED FOR ALLERGIC REACTION 2 each 1   estradiol (CLIMARA) 0.1 mg/24hr patch APPLY 1 PATCH TO  SKIN ONCE WEEKLY 12 patch 4   Estradiol (YUVAFEM) 10 MCG TABS vaginal tablet Place 1 tablet (10 mcg total) vaginally 2 (two) times a week. 24 tablet 3   ezetimibe (ZETIA) 10 MG tablet Take 1 tablet (10 mg total) by mouth daily. 90 tablet 1   fenofibrate (TRICOR) 145 MG tablet Take 1 tablet (145 mg total) by mouth daily. 90 tablet 1   fluticasone (FLONASE) 50 MCG/ACT nasal spray Place 2 sprays into both nostrils daily. 16 g 3   fluticasone furoate-vilanterol (BREO ELLIPTA) 100-25 MCG/ACT AEPB Inhale 1 puff into the lungs daily. 938 each 3   folic acid (FOLVITE) 1 MG tablet Take 1 tablet (1 mg total) by mouth daily. 90 tablet 1   gabapentin (NEURONTIN) 600 MG tablet Take 1/2 (one-half) tablet by mouth daily 45 tablet 3   HYDROcodone-acetaminophen (NORCO) 7.5-325 MG tablet One tab po tid for pain prn only 30 tablet 0   hydroxychloroquine (PLAQUENIL) 200 MG tablet Take 1 tablet by mouth twice daily 180 tablet 0   hyoscyamine (ANASPAZ) 0.125 MG TBDP disintergrating tablet Place 0.125 mg under the tongue every 4 (four) hours as needed for bladder spasms.      Lactobacillus (DIGESTIVE HEALTH PROBIOTIC PO) Take 1 tablet by mouth daily.      levothyroxine (SYNTHROID) 125 MCG tablet Take 1 tablet (125 mcg total) by mouth daily. 90 tablet 3   LINZESS 290 MCG CAPS capsule Take 1 capsule (290 mcg total) by mouth daily. 90 capsule 3   Magnesium Oxide (NATRUL MAGNESIUM PO) Take by mouth.      montelukast (SINGULAIR) 10 MG tablet TAKE 1 TABLET BY MOUTH ONCE DAILY FOR ASTHMA 90 tablet 0   Multiple Vitamins-Minerals (ZINC PO) Take by mouth daily.     mupirocin ointment (BACTROBAN) 2 % as needed.  0   pantoprazole (PROTONIX) 40 MG tablet Take 40 mg by mouth daily.      Polyethyl Glycol-Propyl Glycol 0.4-0.3 % SOLN Apply to eye.      Probiotic Product ([place]) CAPS Take by mouth.      sodium chloride (OCEAN) 0.65 % SOLN nasal spray Place 1 spray into both nostrils as needed for congestion.       sodium chloride 0.9 % nebulizer solution      SUMAtriptan-naproxen (TREXIMET) 85-500 MG tablet daily as needed.      Syringe/Needle, Disp, (SYRINGE 3CC/25GX5/8") 25G X 5/8" 3 ML MISC Use as directed once a month 1 each 3   Tuberculin-Allergy Syringes 28G X 1/2" 1 ML MISC 1 each by Does not apply route every 14 (fourteen) days. For allergy injections 12 each 3   venlafaxine XR (EFFEXOR-XR) 150 MG 24 hr capsule Take 2 capsules (300 mg total) by mouth at bedtime. 180 capsule 1   Vitamin D, Ergocalciferol, (DRISDOL) 1.25 MG (50000 UNIT) CAPS capsule Take 1 capsule (50,000 Units total) by mouth every 7 (seven) days. Monday 12 capsule 1   vitamin E 1000 UNIT capsule Take by mouth.      No current facility-administered medications for this visit.     Musculoskeletal: Strength & Muscle Tone:  N/A Gait & Station:  N/A Patient leans: N/A  Psychiatric Specialty Exam: Review of Systems  Psychiatric/Behavioral:  Negative for agitation, behavioral problems, confusion, decreased concentration, dysphoric mood, hallucinations, self-injury, sleep  disturbance and suicidal ideas. The patient is nervous/anxious. The patient is not hyperactive.   All other systems reviewed and are negative.   There were no vitals taken for this visit.There is no height or weight on file to calculate BMI.  General Appearance: Fairly Groomed  Eye Contact:  Good  Speech:  Clear and Coherent  Volume:  Normal  Mood:   good  Affect:  Appropriate, Congruent, and Full Range  Thought Process:  Coherent  Orientation:  Full (Time, Place, and Person)  Thought Content: Logical   Suicidal Thoughts:  No  Homicidal Thoughts:  No  Memory:  Immediate;   Good  Judgement:  Good  Insight:  Good  Psychomotor Activity:  Normal  Concentration:  Concentration: Good and Attention Span: Good  Recall:  Good  Fund of Knowledge: Good  Language: Good  Akathisia:  No  Handed:  Right  AIMS (if indicated): not done  Assets:  Communication  Skills Desire for Improvement  ADL's:  Intact  Cognition: WNL  Sleep:  Good   Screenings: Mini-Mental    Flowsheet Row Office Visit from 04/02/2022 in [facility], [place] from 03/26/2021 in [facility], [place] from 03/22/2020 in [facility], [place] from 03/22/2019 in [facility], [facility]  Total Score (max 30 points ) _0 PHQ2-9    [place] Visit from 04/02/2022 in [facility], [facility] Office Visit from 03/03/2022 in [place] Video Visit from 10/09/2021 in [place] Office Visit from 09/24/2021 in [facility], [facility] Office Visit from 07/19/2021 in [facility], [facility]  PHQ-2 Total Score 0 _1 0      Flowsheet Row Admission (Discharged) from 10/16/2021 in [place] CATEGORY No Risk        Assessment and Plan:  Susan [name] is a 66 y.o. year old female with a history of depression, anxiety, Sjogren's syndrome on hydroxychloroquine, bilateral osteoarthritis, COPD, s/p thyroidectomy, hypothyroidism, OSA (on CPAP),  history of Celiac Disease, GERD,  , who presents for follow up appointment for below.   1. MDD (major depressive disorder), recurrent, in full remission (Susan [name]) 3. Generalized anxiety disorder She denies any significant depressive symptoms or anxiety except self-limiting anxiety in the context of loss of her friend.  Psychosocial stressors includes loss of her 2 close friends, and another friend who is suffering from COPD, pain, loss of her sister-in-law,  loss of her parents a few years ago.  She enjoys the time with her husband, and is active during the day.  Although it has been discussed at least several times to try discontinuation of amitriptyline to avoid polypharmacy and another potential side effects includes dry mouth, she had  a strong preference to stay on the current medication regimen including amitriptyline given it has been beneficial for muscle spasm..  Will continue current dose of venlafaxine and amitriptyline to target depression and anxiety.  Will obtain EKG to monitor QTc prolongation given she is also on hydroxychloroquine.   2. Cognitive decline Slightly worsening.  She has memory loss, and has occasional issues with executive functioning.  Will send a referral to OT for evaluation and treatment accordingly.  Will obtain labs to rule out medical issues contributing to her symptoms. Noted that although she reports good benefit from Ritalin in the past, it was reportedly discontinued due to adverse reaction.  Plan Continue venlafaxine 300 mg daily  Continue amitriptyline 25 mg at night (anticholinergic side effect from higher dose) Obtain EKG Obtain labs (TSH, vit B12, folate) Referral to OT Next appointment: 5/3 at 8 AM for 30 mins, video (she will go to [place])  - on carisoprodol Physiological scientist), gabapentin 300 mg BID - on xanax 0.5 mg  TID, prescribed by Dr. Merlene Laughter   Past trials of medication: sertraline, Paxil, venlafaxine, Xanax, Ritalin (worked very well)    The patient demonstrates the following risk factors for suicide: Chronic risk factors for suicide include: psychiatric disorder of anxiety. Acute risk factors for suicide include: N/A. Protective factors for this patient include: positive social support, responsibility to others (children, family), coping skills and hope for the future. Considering these factors, the overall suicide risk at this point appears to be low. She owns a gun. Patient is appropriate for outpatient follow up.  Collaboration of Care: Collaboration of Care: Other reviewed notes in Epic  Patient/Guardian was advised Release of Information must be obtained prior to any record release in order to collaborate their care with an outside provider. Patient/Guardian was advised if  they have not already done so to contact the registration department to sign all necessary forms in order for [place] to release information regarding their care.   Consent: Patient/Guardian gives verbal consent for treatment and assignment of benefits for services provided during this visit. Patient/Guardian expressed understanding and agreed to proceed.    [name], MD 10/08/2022, 8:37 AM

## 2022-10-08 ENCOUNTER — Telehealth (INDEPENDENT_AMBULATORY_CARE_PROVIDER_SITE_OTHER): Payer: Medicare HMO | Admitting: Psychiatry

## 2022-10-08 ENCOUNTER — Telehealth: Payer: Self-pay

## 2022-10-08 ENCOUNTER — Encounter: Payer: Self-pay | Admitting: Psychiatry

## 2022-10-08 DIAGNOSIS — F3342 Major depressive disorder, recurrent, in full remission: Secondary | ICD-10-CM

## 2022-10-08 DIAGNOSIS — R69 Illness, unspecified: Secondary | ICD-10-CM | POA: Diagnosis not present

## 2022-10-08 DIAGNOSIS — R4189 Other symptoms and signs involving cognitive functions and awareness: Secondary | ICD-10-CM | POA: Diagnosis not present

## 2022-10-08 DIAGNOSIS — F411 Generalized anxiety disorder: Secondary | ICD-10-CM | POA: Diagnosis not present

## 2022-10-08 MED ORDER — AMITRIPTYLINE HCL 25 MG PO TABS: 25.0000 mg | ORAL_TABLET | Freq: Every day | ORAL | 1 refills | Status: DC

## 2022-10-08 NOTE — Telephone Encounter (Signed)
Done

## 2022-10-08 NOTE — Patient Instructions (Signed)
Continue venlafaxine 300 mg daily  Continue amitriptyline 25 mg at night  Obtain EKG Obtain labs (TSH, vit B12, folate) Referral to OT Next appointment: 5/3 at 8 AM

## 2022-10-09 ENCOUNTER — Telehealth: Payer: Self-pay | Admitting: Psychiatry

## 2022-10-09 NOTE — Telephone Encounter (Signed)
Patient called stating at her appointment yesterday you gave her phone numbers to call to have test done. She has lost the paper with number and where to go. Can you please provide those again?

## 2022-10-09 NOTE — Telephone Encounter (Signed)
It was for EKG. [phone]. I believe there are located at [facility].

## 2022-10-10 ENCOUNTER — Ambulatory Visit
Admission: RE | Admit: 2022-10-10 | Discharge: 2022-10-10 | Disposition: A | Discharge status: Home / Self Care | Payer: Medicare HMO | Source: Ambulatory Visit | Attending: Nurse Practitioner | Admitting: Nurse Practitioner

## 2022-10-10 DIAGNOSIS — Z1231 Encounter for screening mammogram for malignant neoplasm of breast: Secondary | ICD-10-CM | POA: Diagnosis not present

## 2022-10-13 ENCOUNTER — Telehealth: Payer: Self-pay

## 2022-10-13 NOTE — Telephone Encounter (Signed)
Faxed patient papers for [facility].

## 2022-10-14 ENCOUNTER — Other Ambulatory Visit: Payer: Self-pay | Admitting: Nurse Practitioner

## 2022-10-14 NOTE — Therapy (Signed)
OUTPATIENT PHYSICAL THERAPY THORACOLUMBAR EVALUATION   Patient Name: Susan [name] MRN: [id number] DOB:04-13-1956, 66 y.o., female Today's Date: 10/15/2022  END OF SESSION:  PT End of Session - 10/15/22 0739     Visit Number 1    Number of Visits 1    Authorization Type [employer] HMO; no VL    Authorization Time Period please check auth    PT Start Time 2248    PT Stop Time 0814    PT Time Calculation (min) 38 min             Past Medical History:  Diagnosis Date   Arthritis    Asthma    Asthmatic bronchitis    BRCA negative 05/2014   MyRisk neg   Celiac disease    Celiac sprue    Chronic anxiety    Complication of anesthesia    vomiting   COPD (chronic obstructive pulmonary disease) ([place])    Depression    Environmental allergies    Family history of breast cancer 05/2014   MyRisk neg; IBIS=29.5%   Fibromyalgia    neuropathy all over   Gastritis 10/08/2013   GERD (gastroesophageal reflux disease)    Hyperlipemia    Hyperlipidemia    Intolerant to statins   Hypothyroidism (acquired)    Increased risk of breast cancer 05/2014   IBIS=29.5%   Increased risk of breast cancer 05/2014   IBIS=29.5%   Migraine    Migraines    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    Past Surgical History:  Procedure Laterality Date   ABDOMINAL HYSTERECTOMY  2006   BLADDER REPAIR     BREAST CYST ASPIRATION Left yrs ago   [place]   COLONOSCOPY N/A 10/16/2021   Procedure: COLONOSCOPY;  Surgeon: Robert Bellow, MD;  Location: [facility] ENDOSCOPY;  Service: Endoscopy;  Laterality: N/A;   COLONOSCOPY WITH ESOPHAGOGASTRODUODENOSCOPY (EGD)     COLONOSCOPY WITH PROPOFOL N/A 08/03/2017   Procedure: COLONOSCOPY WITH PROPOFOL;  Surgeon: Lollie Sails, MD;  Location: [facility] ENDOSCOPY;  Service: Endoscopy;  Laterality: N/A;    COLONOSCOPY WITH PROPOFOL N/A 10/18/2018   Procedure: COLONOSCOPY WITH PROPOFOL;  Surgeon: Lollie Sails, MD;  Location: [facility] ENDOSCOPY;  Service: Endoscopy;  Laterality: N/A;   ESOPHAGOGASTRODUODENOSCOPY N/A 10/07/2013   Procedure: ESOPHAGOGASTRODUODENOSCOPY (EGD);  Surgeon: Danie Binder, MD;  [facility];  Service: Endoscopy;  Laterality: N/A;  patient received heparin at 530am given phenergan 21m IV 30 minutes before   ESOPHAGOGASTRODUODENOSCOPY     x4   EYE SURGERY     lacrimal gland    FLEXIBLE BRONCHOSCOPY N/A 06/04/2016   Procedure: FLEXIBLE BRONCHOSCOPY;  Surgeon: SAllyne Gee MD;  Location: [facility] ORS;  Service: Pulmonary;  Laterality: N/A;   INCONTINENCE SURGERY  2004   NASAL SINUS SURGERY     THYROID LOBECTOMY     TOTAL THYROIDECTOMY     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 06/20/2020   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   Essential tremor 06/20/2020   OSA (obstructive sleep apnea) 03/26/2020   Chronic wrist pain, right 11/02/2019  Chronic pain of left wrist 11/02/2019   Pain in joints of right hand 11/02/2019   Pain in joint of left hand 11/02/2019   Primary osteoarthritis of both hands 11/02/2019   MDD (major depressive disorder), recurrent, in partial remission ([place]) 10/05/2019   Dysuria 04/03/2019   Generalized anxiety disorder 02/28/2019   Chronic pain of both knees 03/01/2018   Encounter for general adult medical examination with abnormal findings 03/01/2018   Primary osteoarthritis of both first carpometacarpal joints 03/01/2018   Chronic obstructive pulmonary disease, unspecified ([place]) 12/04/2017   Shortness of breath 12/04/2017   Allergic rhinitis, unspecified 12/04/2017   Low  grade squamous intraepith lesion on cytologic smear vagina (lgsil) 09/09/2017   Vaginal atrophy 09/01/2017   Chronic right-sided low back pain without sciatica 01/05/2017   Primary Sjogren's syndrome ([place]) 01/05/2017   CD (celiac disease) 04/17/2015   Gastroesophageal reflux disease with esophagitis 04/17/2015   Hx of Sjogren's disease ([place]) 04/17/2015   Acute asthma exacerbation 10/14/2013   Acute respiratory failure ([place]) 10/14/2013   Gastritis 10/08/2013   Bradycardia 10/07/2013   Esophageal spasm 10/06/2013   Substernal precordial chest pain 10/06/2013   Chronic anxiety 10/06/2013   GERD (gastroesophageal reflux disease) 10/06/2013   Chronic asthma 10/06/2013   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   Depression with anxiety 10/06/2013   Fibromyalgia 10/06/2013   Hypothyroidism 10/06/2013   Chronic migraine 10/06/2013    PCP: [name], NP  REFERRING PROVIDER: Phillips Odor, MD  REFERRING DIAG: low back pain  Rationale for Evaluation and Treatment: Rehabilitation  THERAPY DIAG:  Low back pain, unspecified back pain laterality, unspecified chronicity, unspecified whether sciatica present  Chronic left-sided low back pain with left-sided sciatica  ONSET DATE: about 6 months or so  SUBJECTIVE:                                                                                                                                                                                           SUBJECTIVE STATEMENT: Patient moving to [place] for 6 months after Christmas so will be a one time visit today; pain started after swimming from her low back down into the left leg.  See Dr. Merlene Laughter normally; "he is my neurologist"; told him about her back pain and so he referred to therapy.   PERTINENT HISTORY:  Tremors Vertigo Bilateral plantar fasciitis  PAIN:  Are you having pain? Yes: NPRS scale: 5/10 Pain location: low back and left side gluteal and down leg Pain description: aching  pain Aggravating factors: sitting Relieving factors: getting up and moving   PRECAUTIONS: None  WEIGHT BEARING RESTRICTIONS: No  FALLS:  Has patient fallen in  last 6 months? Yes. Number of falls 3-4  LIVING ENVIRONMENT: Lives with: lives with their spouse Lives in: House/apartment Stairs: Yes: Internal: 14 steps; on right going up, on left going up, and can reach both and External: 2-6 steps; on right going up and on left going up Has following equipment at home: Single point cane, Walker - 2 wheeled, shower chair, and bed side commode  OCCUPATION: retired  PLOF: Independent  PATIENT GOALS: less pain in my back and down my leg  NEXT MD VISIT:   OBJECTIVE:   DIAGNOSTIC FINDINGS:  None showing in epic  PATIENT SURVEYS:  FOTO 52  COGNITION: Overall cognitive status: Within functional limits for tasks assessed     SENSATION: WFL  POSTURE: rounded shoulders and forward head  PALPATION: No tenderness on palpation  LUMBAR ROM:   AROM eval  Flexion 80% available  Extension 40% available  Right lateral flexion To knee  Left lateral flexion To knee  Right rotation   Left rotation    (Blank rows = not tested)  LOWER EXTREMITY MMT:    MMT Right eval Left eval  Hip flexion 4+ 4+  Hip extension 4- 3+  Hip abduction    Hip adduction    Hip internal rotation    Hip external rotation    Knee flexion 4 4-  Knee extension 5 4+  Ankle dorsiflexion 5 4+  Ankle plantarflexion    Ankle inversion    Ankle eversion     (Blank rows = not tested)  FUNCTIONAL TESTS:  5 times sit to stand: 14.77 sec using hands to assist   TODAY'S TREATMENT:                                                                                                                              DATE: 10/15/2022 physical therapy evaluation and HEP instruction    PATIENT EDUCATION:  Education details: Patient educated on exam findings, POC, scope of PT, HEP. Person educated: Patient Education  method: Explanation, Demonstration, and Handouts Education comprehension: verbalized understanding, returned demonstration, verbal cues required, and tactile cues required  HOME EXERCISE PROGRAM: Access Code: [email or web address] URL: [email or web address] Date: 10/15/2022 Prepared by: [facility] - Rehab  Exercises - Lying Prone  - 2 x daily - 7 x weekly - 1 sets - 10 reps - Prone Gluteal Sets  - 2 x daily - 7 x weekly - 1 sets - 10 reps - 5 sec hold - Supine Bridge  - 2 x daily - 7 x weekly - 1 sets - 10 reps - Supine Transversus Abdominis Bracing - Hands on Stomach  - 2 x daily - 7 x weekly - 1 sets - 10 reps - 5 sec hold - Supine Lower Trunk Rotation  - 2 x daily - 7 x weekly - 1 sets - 10 reps  ASSESSMENT:  CLINICAL IMPRESSION: Patient is a 66 y.o. female who was seen today for physical therapy evaluation and treatment  for low back pain with sciatica.  Patient demonstrates muscle weakness, reduced ROM, and fascial restrictions which are likely contributing to symptoms of pain and are negatively impacting patient ability to perform ADLs and functional mobility tasks. Patient will benefit from skilled physical therapy services to address these deficits to reduce pain and improve level of function with ADLs and functional mobility tasks.   OBJECTIVE IMPAIRMENTS: decreased activity tolerance, decreased balance, decreased endurance, decreased knowledge of condition, decreased mobility, difficulty walking, decreased ROM, decreased strength, decreased safety awareness, hypomobility, increased fascial restrictions, impaired perceived functional ability, increased muscle spasms, impaired flexibility, and pain.   ACTIVITY LIMITATIONS: carrying, lifting, bending, sitting, standing, squatting, sleeping, stairs, locomotion level, and caring for others  PARTICIPATION LIMITATIONS: meal prep, cleaning, laundry, shopping, and community activity  REHAB POTENTIAL: Good  CLINICAL DECISION MAKING:  Stable/uncomplicated  EVALUATION COMPLEXITY: Low   GOALS: Goals reviewed with patient? No  SHORT TERM GOALS: Target date: 10/22/2022  Patient will be independent with initial HEP   Baseline: Goal status: INITIAL  LONG TERM GOALS: Target date: 10/29/2021  Patient will be independent in self management strategies to improve quality of life and functional outcomes.  Baseline:  Goal status: INITIAL  2. Patient will improve FOTO score to predicted value  Baseline: 52 Goal status: INITIAL  3.  Patient will increase  leg MMTs to 5/5 without pain to promote return to ambulation community distances with minimal deviation. Baseline:  Goal status: INITIAL  4.   Patient will report at least 50% improvement in overall symptoms and/or function to demonstrate improved functional mobility  Baseline:  Goal status: INITIAL   PLAN:  PT FREQUENCY: 1x/week  PT DURATION: 1 week  PLANNED INTERVENTIONS: Therapeutic exercises, Therapeutic activity, Neuromuscular re-education, Balance training, Gait training, Patient/Family education, Joint manipulation, Joint mobilization, Stair training, Orthotic/Fit training, DME instructions, Aquatic Therapy, Dry Needling, Electrical stimulation, Spinal manipulation, Spinal mobilization, Cryotherapy, Moist heat, Compression bandaging, scar mobilization, Splintting, Taping, Traction, Ultrasound, Ionotophoresis 41m/ml Dexamethasone, and Manual therapy .  PLAN FOR NEXT SESSION: Review HEP and goals, [place].    8:23 AM, 10/15/22 Tasheka Houseman Small Gabino Hagin MPT [facility] [place] #[phone]

## 2022-10-15 ENCOUNTER — Ambulatory Visit (HOSPITAL_COMMUNITY): Payer: Medicare HMO | Attending: Neurology

## 2022-10-15 DIAGNOSIS — G8929 Other chronic pain: Secondary | ICD-10-CM | POA: Diagnosis not present

## 2022-10-15 DIAGNOSIS — M5442 Lumbago with sciatica, left side: Secondary | ICD-10-CM | POA: Diagnosis not present

## 2022-10-15 DIAGNOSIS — M545 Low back pain, unspecified: Secondary | ICD-10-CM | POA: Insufficient documentation

## 2022-10-16 NOTE — Telephone Encounter (Signed)
Done

## 2022-10-20 NOTE — Progress Notes (Unsigned)
PCP: [name], NP   No chief complaint on file.   HPI:      [name] is a 66 y.o. G0P0000 whose LMP was No LMP recorded. Patient has had a hysterectomy., presents today for her annual examination.  Her menses are absent due to menopause. No PMB  She {does:18564} have vasomotor sx.   Sex activity: {sex active: 315163}. She {does:18564} have vaginal dryness.  Last Pap: {KVQQ:[id number]}  Results were: {norm/abn:16707::"no abnormalities"} /neg HPV DNA.  Hx of STDs: {STD hx:14358}  Last mammogram: 10/10/22  Results were: normal--routine follow-up in 12 months There is no FH of breast cancer. There is no FH of ovarian cancer. The patient {does:18564} do self-breast exams.  Colonoscopy: 11/22  Repeat due after 10*** years.   Tobacco use: {tob:20664} Alcohol use: {Alcohol:11675} No drug use Exercise: {exercise:31265}  She {does:18564} get adequate calcium and Vitamin D in her diet.  Labs with PCP.   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 06/20/2020   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   Essential tremor 06/20/2020   OSA (obstructive sleep apnea) 03/26/2020   Chronic wrist pain, right 11/02/2019   Chronic pain of left wrist 11/02/2019   Pain in joints of right hand 11/02/2019   Pain in joint of left hand 11/02/2019   Primary osteoarthritis of both hands 11/02/2019   MDD (major depressive disorder), recurrent, in partial remission ([place]) 10/05/2019   Dysuria 04/03/2019   Generalized anxiety disorder 02/28/2019   Chronic pain of both knees 03/01/2018   Encounter for general adult medical examination with abnormal findings 03/01/2018   Primary osteoarthritis of both first carpometacarpal joints  03/01/2018   Chronic obstructive pulmonary disease, unspecified ([place]) 12/04/2017   Shortness of breath 12/04/2017   Allergic rhinitis, unspecified 12/04/2017   Low grade squamous intraepith lesion on cytologic smear vagina (lgsil) 09/09/2017   Vaginal atrophy 09/01/2017   Chronic right-sided low back pain without sciatica 01/05/2017   Primary Sjogren's syndrome ([place]) 01/05/2017   CD (celiac disease) 04/17/2015   Gastroesophageal reflux disease with esophagitis 04/17/2015   Hx of Sjogren's disease ([place]) 04/17/2015   Acute asthma exacerbation 10/14/2013   Acute respiratory failure ([place]) 10/14/2013   Gastritis 10/08/2013   Bradycardia 10/07/2013   Esophageal spasm 10/06/2013   Substernal precordial chest pain 10/06/2013   Chronic anxiety 10/06/2013   GERD (gastroesophageal reflux disease) 10/06/2013   Chronic asthma 10/06/2013   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   Depression with anxiety 10/06/2013   Fibromyalgia 10/06/2013   Hypothyroidism 10/06/2013   Chronic migraine 10/06/2013    Past Surgical History:  Procedure Laterality Date   ABDOMINAL HYSTERECTOMY  2006   BLADDER REPAIR     BREAST CYST ASPIRATION Left yrs ago   [place]   COLONOSCOPY N/A 10/16/2021   Procedure: COLONOSCOPY;  Surgeon: Robert Bellow, MD;  Location: [facility] ENDOSCOPY;  Service: Endoscopy;  Laterality: N/A;   COLONOSCOPY WITH ESOPHAGOGASTRODUODENOSCOPY (EGD)     COLONOSCOPY WITH PROPOFOL N/A 08/03/2017   Procedure: COLONOSCOPY WITH PROPOFOL;  Surgeon: Lollie Sails, MD;  Location: [facility] ENDOSCOPY;  Service: Endoscopy;  Laterality: N/A;   COLONOSCOPY WITH PROPOFOL N/A 10/18/2018   Procedure: COLONOSCOPY WITH PROPOFOL;  Surgeon: Lollie Sails, MD;  Location: [facility] ENDOSCOPY;  Service: Endoscopy;  Laterality:  N/A;   ESOPHAGOGASTRODUODENOSCOPY N/A 10/07/2013   Procedure: ESOPHAGOGASTRODUODENOSCOPY (EGD);  Surgeon: Danie Binder, MD;  [facility];  Service: Endoscopy;   Laterality: N/A;  patient received heparin at 530am given phenergan 70m IV 30 minutes before   ESOPHAGOGASTRODUODENOSCOPY     x4   EYE SURGERY     lacrimal gland    FLEXIBLE BRONCHOSCOPY N/A 06/04/2016   Procedure: FLEXIBLE BRONCHOSCOPY;  Surgeon: SAllyne Gee MD;  Location: [facility] ORS;  Service: Pulmonary;  Laterality: N/A;   INCONTINENCE SURGERY  2004   NASAL SINUS SURGERY     THYROID LOBECTOMY     TOTAL THYROIDECTOMY      Family History  Problem Relation Age of Onset   Arthritis/Rheumatoid Mother        died in age 6243s  Breast cancer Mother 45  Breast cancer Maternal Aunt        X 2. 50's   Breast cancer Maternal Aunt    Lung cancer Maternal Aunt    Breast cancer Maternal Aunt    Breast cancer Maternal Grandmother        40's   Pancreatitis Neg Hx    Colon cancer Neg Hx     Social History   Socioeconomic History   Marital status: Married    Spouse name: Not on file   Number of children: 0   Years of education: Not on file   Highest education level: Not on file  Occupational History   Not on file  Tobacco Use   Smoking status: Never    Passive exposure: Past   Smokeless tobacco: Never  Vaping Use   Vaping Use: Never used  Substance and Sexual Activity   Alcohol use: No   Drug use: No   Sexual activity: Yes    Birth control/protection: Surgical  Other Topics Concern   Not on file  Social History Narrative   Not on file   Social Determinants of Health   Financial Resource Strain: Low Risk  (03/07/2021)   Overall Financial Resource Strain (CARDIA)    Difficulty of Paying Living Expenses: Not very hard  Food Insecurity: Not on file  Transportation Needs: Not on file  Physical Activity: Not on file  Stress: Not on file  Social Connections: Not on file  Intimate Partner Violence: Not on file     Current Outpatient Medications:    acetaminophen (TYLENOL) 500 MG tablet, Take by mouth as needed., Disp: , Rfl:    albuterol (PROVENTIL) (2.5 MG/3ML)  0.083% nebulizer solution, Take 3 mLs (2.5 mg total) by nebulization every 6 (six) hours as needed for wheezing or shortness of breath., Disp: 75 mL, Rfl: 6   albuterol (VENTOLIN HFA) 108 (90 Base) MCG/ACT inhaler, Inhale 2 puffs into the lungs every 6 (six) hours as needed for wheezing or shortness of breath., Disp: 1 each, Rfl: 2   ALPRAZolam (XANAX) 0.5 MG tablet, Take 1 tablet (0.5 mg total) by mouth 3 (three) times daily as needed. for anxiety, Disp: 90 tablet, Rfl: 0   [START ON 11/05/2022] amitriptyline (ELAVIL) 25 MG tablet, Take 1 tablet (25 mg total) by mouth at bedtime., Disp: 90 tablet, Rfl: 1   Ascorbic Acid (VITAMIN C) 100 MG tablet, Take 100 mg by mouth daily. , Disp: , Rfl:    azelastine (ASTELIN) 0.1 % nasal spray, Place 2 sprays into both nostrils 2 (two) times daily. Use in each nostril as directed, Disp: , Rfl:    carisoprodol (SOMA) 350  MG tablet, Take 350 mg by mouth at bedtime. Patient states that she takes 1/2 tablet nightly , Disp: , Rfl:    cyanocobalamin (VITAMIN B12) 1000 MCG/ML injection, INJECT 1 ML (CC) INTRAMUSCULARLY ONCE EVERY MONTH, Disp: 1 mL, Rfl: 0   Dentifrices (BIOTENE DRY MOUTH DT), by Transmucosal route., Disp: , Rfl:    eletriptan (RELPAX) 40 MG tablet, Take 40 mg by mouth as needed for migraine. may repeat in 2 hours if necessary , Disp: , Rfl:    EPINEPHRINE 0.3 mg/0.3 mL IJ SOAJ injection, INJECT CONTENTS OF 1 PEN AS NEEDED FOR ALLERGIC REACTION, Disp: 2 each, Rfl: 1   estradiol (CLIMARA) 0.1 mg/24hr patch, APPLY 1 PATCH TO SKIN ONCE WEEKLY, Disp: 12 patch, Rfl: 4   Estradiol (YUVAFEM) 10 MCG TABS vaginal tablet, Place 1 tablet (10 mcg total) vaginally 2 (two) times a week., Disp: 24 tablet, Rfl: 3   ezetimibe (ZETIA) 10 MG tablet, Take 1 tablet (10 mg total) by mouth daily., Disp: 90 tablet, Rfl: 1   fenofibrate (TRICOR) 145 MG tablet, Take 1 tablet (145 mg total) by mouth daily., Disp: 90 tablet, Rfl: 1   fluticasone (FLONASE) 50 MCG/ACT nasal spray,  Place 2 sprays into both nostrils daily., Disp: 16 g, Rfl: 3   fluticasone furoate-vilanterol (BREO ELLIPTA) 100-25 MCG/ACT AEPB, Inhale 1 puff into the lungs daily., Disp: 180 each, Rfl: 3   folic acid (FOLVITE) 1 MG tablet, Take 1 tablet (1 mg total) by mouth daily., Disp: 90 tablet, Rfl: 1   gabapentin (NEURONTIN) 600 MG tablet, Take 1/2 (one-half) tablet by mouth daily, Disp: 45 tablet, Rfl: 3   HYDROcodone-acetaminophen (NORCO) 7.5-325 MG tablet, One tab po tid for pain prn only, Disp: 30 tablet, Rfl: 0   hydroxychloroquine (PLAQUENIL) 200 MG tablet, Take 1 tablet by mouth twice daily, Disp: 180 tablet, Rfl: 0   hyoscyamine (ANASPAZ) 0.125 MG TBDP disintergrating tablet, Place 0.125 mg under the tongue every 4 (four) hours as needed for bladder spasms. , Disp: , Rfl:    Lactobacillus (DIGESTIVE HEALTH PROBIOTIC PO), Take 1 tablet by mouth daily. , Disp: , Rfl:    levothyroxine (SYNTHROID) 125 MCG tablet, Take 1 tablet (125 mcg total) by mouth daily., Disp: 90 tablet, Rfl: 3   LINZESS 290 MCG CAPS capsule, Take 1 capsule (290 mcg total) by mouth daily., Disp: 90 capsule, Rfl: 3   Magnesium Oxide (NATRUL MAGNESIUM PO), Take by mouth. , Disp: , Rfl:    montelukast (SINGULAIR) 10 MG tablet, TAKE 1 TABLET BY MOUTH ONCE DAILY FOR ASTHMA, Disp: 90 tablet, Rfl: 0   Multiple Vitamins-Minerals (ZINC PO), Take by mouth daily., Disp: , Rfl:    mupirocin ointment (BACTROBAN) 2 %, as needed., Disp: , Rfl: 0   pantoprazole (PROTONIX) 40 MG tablet, Take 40 mg by mouth daily. , Disp: , Rfl:    Polyethyl Glycol-Propyl Glycol 0.4-0.3 % SOLN, Apply to eye. , Disp: , Rfl:    Probiotic Product ([place]) CAPS, Take by mouth. , Disp: , Rfl:    sodium chloride (OCEAN) 0.65 % SOLN nasal spray, Place 1 spray into both nostrils as needed for congestion. , Disp: , Rfl:    sodium chloride 0.9 % nebulizer solution, , Disp: , Rfl:    SUMAtriptan-naproxen (TREXIMET) 85-500 MG tablet, daily as needed. , Disp: ,  Rfl:    Syringe/Needle, Disp, (SYRINGE 3CC/25GX5/8") 25G X 5/8" 3 ML MISC, Use as directed once a month, Disp: 1 each, Rfl: 3   Tuberculin-Allergy  Syringes 28G X 1/2" 1 ML MISC, 1 each by Does not apply route every 14 (fourteen) days. For allergy injections, Disp: 12 each, Rfl: 3   venlafaxine XR (EFFEXOR-XR) 150 MG 24 hr capsule, Take 2 capsules (300 mg total) by mouth at bedtime., Disp: 180 capsule, Rfl: 1   Vitamin D, Ergocalciferol, (DRISDOL) 1.25 MG (50000 UNIT) CAPS capsule, Take 1 capsule (50,000 Units total) by mouth every 7 (seven) days. Monday, Disp: 12 capsule, Rfl: 1   vitamin E 1000 UNIT capsule, Take by mouth. , Disp: , Rfl:      ROS:  Review of Systems BREAST: No symptoms    Objective: There were no vitals taken for this visit.   OBGyn Exam  Results: No results found for this or any previous visit (from the past 24 hour(s)).  Assessment/Plan:  No diagnosis found.   No orders of the defined types were placed in this encounter.           GYN counsel {counseling: 16159}    F/U  No follow-ups on file.  Calvary Difranco B. Rui Wordell, PA-C 10/20/2022 2:22 PM

## 2022-10-21 ENCOUNTER — Ambulatory Visit: Payer: Medicare HMO | Admitting: Obstetrics and Gynecology

## 2022-10-22 ENCOUNTER — Ambulatory Visit (INDEPENDENT_AMBULATORY_CARE_PROVIDER_SITE_OTHER): Payer: Medicare HMO | Admitting: Obstetrics and Gynecology

## 2022-10-22 ENCOUNTER — Other Ambulatory Visit (HOSPITAL_COMMUNITY)
Admission: RE | Admit: 2022-10-22 | Discharge: 2022-10-22 | Disposition: A | Discharge status: Home / Self Care | Payer: Medicare HMO | Source: Ambulatory Visit | Attending: Obstetrics and Gynecology | Admitting: Obstetrics and Gynecology

## 2022-10-22 ENCOUNTER — Other Ambulatory Visit: Payer: Self-pay

## 2022-10-22 ENCOUNTER — Encounter: Payer: Self-pay | Admitting: Obstetrics and Gynecology

## 2022-10-22 ENCOUNTER — Ambulatory Visit
Admission: RE | Admit: 2022-10-22 | Discharge: 2022-10-22 | Disposition: A | Discharge status: Home / Self Care | Payer: Medicare HMO | Source: Ambulatory Visit | Attending: Psychiatry | Admitting: Psychiatry

## 2022-10-22 VITALS — BP 136/72 | Ht 65.0 in | Wt 188.8 lb

## 2022-10-22 DIAGNOSIS — Z803 Family history of malignant neoplasm of breast: Secondary | ICD-10-CM | POA: Diagnosis not present

## 2022-10-22 DIAGNOSIS — Z01411 Encounter for gynecological examination (general) (routine) with abnormal findings: Secondary | ICD-10-CM | POA: Diagnosis not present

## 2022-10-22 DIAGNOSIS — Z780 Asymptomatic menopausal state: Secondary | ICD-10-CM

## 2022-10-22 DIAGNOSIS — Z1151 Encounter for screening for human papillomavirus (HPV): Secondary | ICD-10-CM | POA: Insufficient documentation

## 2022-10-22 DIAGNOSIS — F3342 Major depressive disorder, recurrent, in full remission: Secondary | ICD-10-CM | POA: Insufficient documentation

## 2022-10-22 DIAGNOSIS — N952 Postmenopausal atrophic vaginitis: Secondary | ICD-10-CM | POA: Diagnosis not present

## 2022-10-22 DIAGNOSIS — Z1231 Encounter for screening mammogram for malignant neoplasm of breast: Secondary | ICD-10-CM

## 2022-10-22 DIAGNOSIS — Z1272 Encounter for screening for malignant neoplasm of vagina: Secondary | ICD-10-CM | POA: Insufficient documentation

## 2022-10-22 DIAGNOSIS — N939 Abnormal uterine and vaginal bleeding, unspecified: Secondary | ICD-10-CM

## 2022-10-22 DIAGNOSIS — Z01419 Encounter for gynecological examination (general) (routine) without abnormal findings: Secondary | ICD-10-CM

## 2022-10-22 DIAGNOSIS — Z79890 Hormone replacement therapy (postmenopausal): Secondary | ICD-10-CM

## 2022-10-22 DIAGNOSIS — Z124 Encounter for screening for malignant neoplasm of cervix: Secondary | ICD-10-CM | POA: Diagnosis not present

## 2022-10-22 DIAGNOSIS — R87622 Low grade squamous intraepithelial lesion on cytologic smear of vagina (LGSIL): Secondary | ICD-10-CM | POA: Insufficient documentation

## 2022-10-22 DIAGNOSIS — R87610 Atypical squamous cells of undetermined significance on cytologic smear of cervix (ASC-US): Secondary | ICD-10-CM | POA: Insufficient documentation

## 2022-10-22 DIAGNOSIS — R69 Illness, unspecified: Secondary | ICD-10-CM | POA: Diagnosis not present

## 2022-10-22 DIAGNOSIS — Z9189 Other specified personal risk factors, not elsewhere classified: Secondary | ICD-10-CM

## 2022-10-22 MED ORDER — ESTRADIOL 0.05 MG/24HR TD PTWK: MEDICATED_PATCH | TRANSDERMAL | 3 refills | Status: DC

## 2022-10-22 MED ORDER — ESTRADIOL 0.1 MG/GM VA CREA: TOPICAL_CREAM | VAGINAL | 2 refills | Status: DC

## 2022-10-22 NOTE — Patient Instructions (Signed)
I value your feedback and you entrusting us with your care. If you get a [facility] patient survey, I would appreciate you taking the time to let us know about your experience today. Thank you! ? ? ?

## 2022-10-24 LAB — CYTOLOGY - PAP
Comment: NEGATIVE
Diagnosis: UNDETERMINED — AB
High risk HPV: NEGATIVE

## 2022-10-28 ENCOUNTER — Other Ambulatory Visit: Payer: Self-pay

## 2022-10-28 DIAGNOSIS — K581 Irritable bowel syndrome with constipation: Secondary | ICD-10-CM | POA: Diagnosis not present

## 2022-10-28 DIAGNOSIS — K219 Gastro-esophageal reflux disease without esophagitis: Secondary | ICD-10-CM | POA: Diagnosis not present

## 2022-10-28 DIAGNOSIS — Z760 Encounter for issue of repeat prescription: Secondary | ICD-10-CM

## 2022-10-28 MED ORDER — FENOFIBRATE 145 MG PO TABS: 145.0000 mg | ORAL_TABLET | Freq: Every day | ORAL | 1 refills | Status: DC

## 2022-11-02 ENCOUNTER — Other Ambulatory Visit: Payer: Self-pay | Admitting: Psychiatry

## 2022-11-02 DIAGNOSIS — F411 Generalized anxiety disorder: Secondary | ICD-10-CM

## 2022-11-02 DIAGNOSIS — F3342 Major depressive disorder, recurrent, in full remission: Secondary | ICD-10-CM

## 2022-11-13 ENCOUNTER — Other Ambulatory Visit: Payer: Self-pay | Admitting: Nurse Practitioner

## 2022-11-26 DIAGNOSIS — G4733 Obstructive sleep apnea (adult) (pediatric): Secondary | ICD-10-CM | POA: Diagnosis not present

## 2022-12-30 ENCOUNTER — Other Ambulatory Visit: Payer: Self-pay | Admitting: Nurse Practitioner

## 2022-12-31 DIAGNOSIS — Z760 Encounter for issue of repeat prescription: Secondary | ICD-10-CM | POA: Diagnosis not present

## 2022-12-31 DIAGNOSIS — R449 Unspecified symptoms and signs involving general sensations and perceptions: Secondary | ICD-10-CM | POA: Insufficient documentation

## 2023-01-19 ENCOUNTER — Telehealth: Payer: Self-pay

## 2023-01-19 ENCOUNTER — Other Ambulatory Visit: Payer: Self-pay | Admitting: Nurse Practitioner

## 2023-01-19 NOTE — Telephone Encounter (Signed)
[facility] papers were faxed on 10-13-22 and again on 01-19-23

## 2023-02-11 NOTE — Progress Notes (Deleted)
Office Visit Note  Patient: Susan [name]             Date of Birth: [birth year]           MRN: [id number]             PCP: Sallyanne Kuster, NP Referring: Lyndon Code, MD Visit Date: 02/25/2023 Occupation: @  Subjective:  No chief complaint on file.   History of Present Illness: Susan [name] is a 67 y.o. female ***     Activities of Daily Living:  Patient reports morning stiffness for *** {minute/hour:19697}.   Patient {ACTIONS;DENIES/REPORTS:21021675::"Denies"} nocturnal pain.  Difficulty dressing/grooming: {ACTIONS;DENIES/REPORTS:21021675::"Denies"} Difficulty climbing stairs: {ACTIONS;DENIES/REPORTS:21021675::"Denies"} Difficulty getting out of chair: {ACTIONS;DENIES/REPORTS:21021675::"Denies"} Difficulty using hands for taps, buttons, cutlery, and/or writing: {ACTIONS;DENIES/REPORTS:21021675::"Denies"}  No Rheumatology ROS completed.   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   Essential tremor 06/20/2020   OSA (obstructive sleep apnea) 03/26/2020   Chronic wrist pain, right 11/02/2019   Chronic pain of left wrist 11/02/2019   Pain in joints of right hand 11/02/2019   Pain in joint of left hand 11/02/2019   Primary osteoarthritis of both hands 11/02/2019   MDD (major depressive disorder), recurrent, 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   Dysuria 04/03/2019   Generalized anxiety disorder 02/28/2019   Chronic pain of both knees 03/01/2018   Encounter for general adult medical examination with abnormal findings 03/01/2018   Primary osteoarthritis of both first carpometacarpal joints 03/01/2018   Chronic obstructive pulmonary disease,  unspecified 12/04/2017   Shortness of breath 12/04/2017   Allergic rhinitis, unspecified 12/04/2017   Low grade squamous intraepith lesion on cytologic smear vagina (lgsil) 09/09/2017   Vaginal atrophy 09/01/2017   Chronic right-sided low back pain without sciatica 01/05/2017   Primary Sjogren's syndrome 01/05/2017   CD (celiac disease) 04/17/2015   Gastroesophageal reflux disease with esophagitis 04/17/2015   Hx of Sjogren's disease 04/17/2015   Acute asthma exacerbation 10/14/2013   Acute respiratory failure 10/14/2013   Gastritis 10/08/2013   Bradycardia 10/07/2013   Esophageal spasm 10/06/2013   Substernal precordial chest pain 10/06/2013   Chronic anxiety 10/06/2013   GERD (gastroesophageal reflux disease) 10/06/2013   Chronic asthma 10/06/2013   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   Depression with anxiety 10/06/2013   Fibromyalgia 10/06/2013   Hypothyroidism 10/06/2013   Chronic migraine 10/06/2013    Past Medical History:  Diagnosis Date   Arthritis    Asthma    Asthmatic bronchitis    BRCA negative 05/2014   MyRisk neg   Celiac disease    Celiac sprue    Chronic anxiety    Complication of anesthesia    vomiting   COPD (chronic obstructive pulmonary disease) (HCC)    Depression    Environmental allergies    Family history of breast cancer 05/2014   MyRisk neg; IBIS=29.5%   Fibromyalgia    neuropathy all over   Gastritis 10/08/2013   GERD (gastroesophageal reflux disease)    Hyperlipemia    Hyperlipidemia    Intolerant to statins   Hypothyroidism (acquired)    Increased risk of breast cancer 05/2014   IBIS=29.5%   Increased risk of breast cancer 05/2014   IBIS=29.5%   Migraine  Migraines    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     Family History  Problem Relation Age of Onset   Arthritis/Rheumatoid Mother         died in age 29s   Breast cancer Mother 7   Breast cancer Maternal Aunt        X 2. 50's   Breast cancer Maternal Aunt    Lung cancer Maternal Aunt    Breast cancer Maternal Aunt    Breast cancer Maternal Grandmother        29's   Pancreatitis Neg Hx    Colon cancer Neg Hx    Past Surgical History:  Procedure Laterality Date   ABDOMINAL HYSTERECTOMY  2006   BLADDER REPAIR     BREAST CYST ASPIRATION Left yrs ago   CHOLECYSTECTOMY  1989   COLONOSCOPY N/A 10/16/2021   Procedure: COLONOSCOPY;  Surgeon: Earline Mayotte, MD;  Location: [facility] ENDOSCOPY;  Service: Endoscopy;  Laterality: N/A;   COLONOSCOPY WITH ESOPHAGOGASTRODUODENOSCOPY (EGD)     COLONOSCOPY WITH PROPOFOL N/A 08/03/2017   Procedure: COLONOSCOPY WITH PROPOFOL;  Surgeon: Christena Deem, MD;  Location: [facility] ENDOSCOPY;  Service: Endoscopy;  Laterality: N/A;   COLONOSCOPY WITH PROPOFOL N/A 10/18/2018   Procedure: COLONOSCOPY WITH PROPOFOL;  Surgeon: Christena Deem, MD;  Location: [facility] ENDOSCOPY;  Service: Endoscopy;  Laterality: N/A;   ESOPHAGOGASTRODUODENOSCOPY N/A 10/07/2013   Procedure: ESOPHAGOGASTRODUODENOSCOPY (EGD);  Surgeon: West Bali, MD;  Location: [facility];  Service: Endoscopy;  Laterality: N/A;  patient received heparin at 530am given phenergan  IV 30 minutes before   ESOPHAGOGASTRODUODENOSCOPY     x4   EYE SURGERY     lacrimal gland    FLEXIBLE BRONCHOSCOPY N/A 06/04/2016   Procedure: FLEXIBLE BRONCHOSCOPY;  Surgeon: Yevonne Pax, MD;  Location: [facility] ORS;  Service: Pulmonary;  Laterality: N/A;   INCONTINENCE SURGERY  2004   NASAL SINUS SURGERY     THYROID LOBECTOMY     TOTAL THYROIDECTOMY     Social History   Social History Narrative   Not on file   Immunization History  Administered Date(s) Administered   Influenza Inj Mdck Quad Pf 07/22/2018, 07/05/2019, 06/27/2020, 07/19/2021   Influenza-Unspecified 08/17/2017   Moderna Sars-Covid-2 Vaccination 12/03/2019, 12/23/2019,  01/24/2020, 08/13/2020, 05/15/2021   Pneumococcal Conjugate-13 10/18/2020   Tdap 07/27/2019   Zoster Recombinat (Shingrix) 08/21/2017, 12/08/2017     Objective: Vital Signs: There were no vitals taken for this visit.   Physical Exam   Musculoskeletal Exam: ***  CDAI Exam: CDAI Score: -- Patient Global: --; Provider Global: -- Swollen: --; Tender: -- Joint Exam 02/25/2023   No joint exam has been documented for this visit   There is currently no information documented on the homunculus. Go to the Rheumatology activity and complete the homunculus joint exam.  Investigation: No additional findings.  Imaging: No results found.  Recent Labs: Lab Results  Component Value Date   WBC 6.4 09/30/2022   HGB 13.1 09/30/2022   PLT 346 09/30/2022   NA 139 09/30/2022   K 4.4 09/30/2022   CL 108 09/30/2022   CO2 21 09/30/2022   GLUCOSE 89 09/30/2022   BUN 15 09/30/2022   CREATININE 0.87 09/30/2022   BILITOT 0.2 09/30/2022   ALKPHOS 79 08/22/2020   AST 17 09/30/2022   ALT 21 09/30/2022  PROT 7.5 09/30/2022   ALBUMIN 4.4 08/22/2020   CALCIUM 9.4 09/30/2022   GFRAA 80 03/06/2021    Speciality Comments: PLQ Eye Exam: 10/02/2022 WNL @ [facility] Follow up in 1 year  Procedures:  No procedures performed Allergies: Aspirin, Keflex [cephalexin], Azithromycin, Barley grass, Clarithromycin, Metoclopramide, Oat, Oatmeal, Other, Rye grass flower pollen extract [gramineae pollens], Vioxx [rofecoxib], Wheat, Wheat extract, Celebrex [celecoxib], and Metoclopramide hcl   Assessment / Plan:     Visit Diagnoses: Sjogren's syndrome with other organ involvement  High risk medication use  Chronic right shoulder pain  Primary osteoarthritis of both hands  Primary osteoarthritis of both knees  Primary osteoarthritis of both feet  Fibromyalgia  Chronic pain syndrome  CD (celiac disease)  Nutcracker esophagus  Esophageal spasm  History of gastroesophageal reflux  (GERD)  History of thyroid cancer  MDD (major depressive disorder), recurrent, in partial remission  Chronic obstructive pulmonary disease, unspecified COPD type  Generalized anxiety disorder  History of asthma  Family history of rheumatoid arthritis  Orders: No orders of the defined types were placed in this encounter.  No orders of the defined types were placed in this encounter.   Face-to-face time spent with patient was *** minutes. Greater than 50% of time was spent in counseling and coordination of care.  Follow-Up Instructions: No follow-ups on file.   Gearldine Bienenstock, PA-C  Note - This record has been created using Dragon software.  Chart creation errors have been sought, but may not always  have been located. Such creation errors do not reflect on  the standard of medical care.

## 2023-02-15 ENCOUNTER — Other Ambulatory Visit: Payer: Self-pay | Admitting: Nurse Practitioner

## 2023-02-18 ENCOUNTER — Other Ambulatory Visit: Payer: Self-pay | Admitting: Psychiatry

## 2023-02-18 ENCOUNTER — Other Ambulatory Visit: Payer: Self-pay | Admitting: Nurse Practitioner

## 2023-02-18 DIAGNOSIS — F411 Generalized anxiety disorder: Secondary | ICD-10-CM

## 2023-02-18 DIAGNOSIS — F3342 Major depressive disorder, recurrent, in full remission: Secondary | ICD-10-CM

## 2023-02-22 DIAGNOSIS — G4733 Obstructive sleep apnea (adult) (pediatric): Secondary | ICD-10-CM | POA: Diagnosis not present

## 2023-02-25 ENCOUNTER — Ambulatory Visit: Payer: Medicare HMO | Admitting: Physician Assistant

## 2023-02-25 ENCOUNTER — Ambulatory Visit: Payer: Self-pay

## 2023-02-25 DIAGNOSIS — M3509 Sicca syndrome with other organ involvement: Secondary | ICD-10-CM

## 2023-02-25 DIAGNOSIS — K900 Celiac disease: Secondary | ICD-10-CM

## 2023-02-25 DIAGNOSIS — J449 Chronic obstructive pulmonary disease, unspecified: Secondary | ICD-10-CM

## 2023-02-25 DIAGNOSIS — G894 Chronic pain syndrome: Secondary | ICD-10-CM

## 2023-02-25 DIAGNOSIS — F3341 Major depressive disorder, recurrent, in partial remission: Secondary | ICD-10-CM

## 2023-02-25 DIAGNOSIS — M170 Bilateral primary osteoarthritis of knee: Secondary | ICD-10-CM

## 2023-02-25 DIAGNOSIS — Z79899 Other long term (current) drug therapy: Secondary | ICD-10-CM

## 2023-02-25 DIAGNOSIS — M19071 Primary osteoarthritis, right ankle and foot: Secondary | ICD-10-CM

## 2023-02-25 DIAGNOSIS — F411 Generalized anxiety disorder: Secondary | ICD-10-CM

## 2023-02-25 DIAGNOSIS — M797 Fibromyalgia: Secondary | ICD-10-CM

## 2023-02-25 DIAGNOSIS — G8929 Other chronic pain: Secondary | ICD-10-CM

## 2023-02-25 DIAGNOSIS — Z85850 Personal history of malignant neoplasm of thyroid: Secondary | ICD-10-CM

## 2023-02-25 DIAGNOSIS — Z8719 Personal history of other diseases of the digestive system: Secondary | ICD-10-CM

## 2023-02-25 DIAGNOSIS — Z8261 Family history of arthritis: Secondary | ICD-10-CM

## 2023-02-25 DIAGNOSIS — Z8709 Personal history of other diseases of the respiratory system: Secondary | ICD-10-CM

## 2023-02-25 DIAGNOSIS — M19041 Primary osteoarthritis, right hand: Secondary | ICD-10-CM

## 2023-02-25 DIAGNOSIS — K224 Dyskinesia of esophagus: Secondary | ICD-10-CM

## 2023-02-25 NOTE — Patient Outreach (Signed)
  Care Coordination   02/25/2023 Name: Susan [name] MRN: [id number] DOB: Oct 28, 1955   Care Coordination Outreach Attempts:  A second unsuccessful outreach was attempted today to offer the patient with information about available care coordination services.  Follow Up Plan:  Additional outreach attempts will be made to offer the patient care coordination information and services.   Encounter Outcome:  No Answer   Care Coordination Interventions:  No, not indicated    SIG Lysle Morales, BSW Social Worker [facility] Care Management  [phone]

## 2023-02-27 ENCOUNTER — Ambulatory Visit: Payer: Self-pay

## 2023-02-27 ENCOUNTER — Telehealth: Payer: Medicare HMO | Admitting: Psychiatry

## 2023-02-27 NOTE — Patient Outreach (Signed)
  Care Coordination   02/27/2023 Name: Susan [name] MRN: [id number] DOB: 08/29/1956   Care Coordination Outreach Attempts:  A third unsuccessful outreach was attempted today to offer the patient with information about available care coordination services.  Follow Up Plan:  No further outreach attempts will be made at this time. We have been unable to contact the patient to offer or enroll patient in care coordination services  Encounter Outcome:  No Answer   Care Coordination Interventions:  No, not indicated    SIG Lysle Morales, BSW Social Worker [facility] Care Management  [phone]

## 2023-03-04 ENCOUNTER — Telehealth: Payer: Self-pay

## 2023-03-04 NOTE — Telephone Encounter (Signed)
Papers fax to [facility] for [employer].

## 2023-03-10 ENCOUNTER — Telehealth: Payer: Self-pay | Admitting: Nurse Practitioner

## 2023-03-10 NOTE — Telephone Encounter (Signed)
S/w pt that we are unable to send medication out of [place] and recommend that she can go to the Urgent Care

## 2023-03-10 NOTE — Telephone Encounter (Signed)
done

## 2023-03-16 NOTE — Progress Notes (Signed)
Office Visit Note  Patient: Susan [name]             Date of Birth: 12/12/1955           MRN: [id number]             PCP: Sallyanne Kuster, NP Referring: Lyndon Code, MD Visit Date: 03/30/2023 Occupation: @GUAROCC @  Subjective:  Right ankle pain   History of Present Illness: Susan [name] is a 67 y.o. female with history of sjogren's syndrome, osteoarthritis, and fibromyalgia. She is taking plaquenil 200 mg 1 tablet by mouth twice daily.  She is tolerating Plaquenil without any side effects and has not missed any doses recently.  Patient continues to have chronic sicca symptoms.  She has been seeing the dentist every 6 months and ophthalmology on a yearly basis.  She is occasional sores in her mouth and nose.  She denies any swollen lymph nodes recently.  She denies any parotid swelling or tenderness.  Patient states she continues to have ongoing pain in both [facility] joints as well as chronic pain in her right ankle joint.  She denies any recent injury or fall.  She states that her right ankle joint stays chronically swollen.  Patient states that she recently had sinusitis and was treated with an antibiotic and a course of prednisone.  She states that while taking prednisone the inflammation in her right ankle joint improved temporarily.  Activities of Daily Living:  Patient reports morning stiffness for all day. Patient Reports nocturnal pain.  Difficulty dressing/grooming: Denies Difficulty climbing stairs: Reports Difficulty getting out of chair: Denies Difficulty using hands for taps, buttons, cutlery, and/or writing: Reports  Review of Systems  Constitutional:  Positive for fatigue.  HENT:  Positive for mouth sores and mouth dryness. Negative for nose dryness.        Nose sores  Eyes:  Positive for dryness. Negative for pain and visual disturbance.  Respiratory:  Positive for wheezing. Negative for cough, hemoptysis, shortness of breath and difficulty breathing.    Cardiovascular:  Negative for chest pain, palpitations, hypertension and swelling in legs/feet.  Gastrointestinal:  Positive for constipation and diarrhea. Negative for blood in stool.  Endocrine: Negative for increased urination.  Genitourinary:  Positive for involuntary urination. Negative for painful urination.  Musculoskeletal:  Positive for joint pain, gait problem, joint pain, joint swelling, myalgias, muscle weakness, morning stiffness, muscle tenderness and myalgias.  Skin:  Positive for sensitivity to sunlight. Negative for color change, pallor, rash, hair loss, nodules/bumps, skin tightness and ulcers.  Allergic/Immunologic: Negative for susceptible to infections.  Neurological:  Positive for dizziness. Negative for numbness, headaches and weakness.  Hematological:  Negative for swollen glands.  Psychiatric/Behavioral:  Positive for sleep disturbance. Negative for depressed mood. The patient is not nervous/anxious.     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   Essential tremor 06/20/2020   OSA (obstructive sleep apnea) 03/26/2020   Chronic wrist pain, right 11/02/2019   Chronic pain of left wrist 11/02/2019   Pain in joints of right hand 11/02/2019   Pain in joint of left hand 11/02/2019   Primary osteoarthritis of both hands 11/02/2019   MDD (major depressive disorder), recurrent, in partial  remission (HCC) 10/05/2019   Dysuria 04/03/2019   Generalized anxiety disorder 02/28/2019   Chronic pain of both knees 03/01/2018   Encounter for general adult medical examination with abnormal findings 03/01/2018   Primary osteoarthritis of both first carpometacarpal  joints 03/01/2018   Chronic obstructive pulmonary disease, unspecified (HCC) 12/04/2017   Shortness of breath 12/04/2017   Allergic rhinitis, unspecified 12/04/2017   Low grade squamous intraepith lesion on cytologic smear vagina (lgsil) 09/09/2017   Vaginal atrophy 09/01/2017   Chronic right-sided low back pain without sciatica 01/05/2017   Primary Sjogren's syndrome (HCC) 01/05/2017   CD (celiac disease) 04/17/2015   Gastroesophageal reflux disease with esophagitis 04/17/2015   Hx of Sjogren's disease (HCC) 04/17/2015   Acute asthma exacerbation 10/14/2013   Acute respiratory failure (HCC) 10/14/2013   Gastritis 10/08/2013   Bradycardia 10/07/2013   Esophageal spasm 10/06/2013   Substernal precordial chest pain 10/06/2013   Chronic anxiety 10/06/2013   GERD (gastroesophageal reflux disease) 10/06/2013   Chronic asthma 10/06/2013   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   Depression with anxiety 10/06/2013   Fibromyalgia 10/06/2013   Hypothyroidism 10/06/2013   Chronic migraine 10/06/2013    Past Medical History:  Diagnosis Date   Arthritis    Asthma    Asthmatic bronchitis    BRCA negative 05/2014   MyRisk neg   Celiac disease    Celiac sprue    Chronic anxiety    Complication of anesthesia    vomiting   COPD (chronic obstructive pulmonary disease) (HCC)    Depression    Environmental allergies    Family history of breast cancer 05/2014   MyRisk neg; IBIS=29.5%   Fibromyalgia    neuropathy all over   Gastritis 10/08/2013   GERD (gastroesophageal reflux disease)    Hyperlipemia    Hyperlipidemia    Intolerant to statins   Hypothyroidism (acquired)    Increased risk of breast cancer 05/2014   IBIS=29.5%   Increased risk of breast cancer 05/2014   IBIS=29.5%   Migraine    Migraines    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      Family History  Problem Relation Age of Onset   Arthritis/Rheumatoid Mother        died in age 10s   Breast cancer Mother 71   Breast cancer Maternal Aunt        X 2. 50's   Breast cancer Maternal Aunt    Lung cancer Maternal Aunt    Breast cancer Maternal Aunt    Breast cancer Maternal Grandmother        57's   Pancreatitis Neg Hx    Colon cancer Neg Hx    Past Surgical History:  Procedure Laterality Date   ABDOMINAL HYSTERECTOMY  2006   BLADDER REPAIR     BREAST CYST ASPIRATION Left yrs ago   CHOLECYSTECTOMY  1989   COLONOSCOPY N/A 10/16/2021   Procedure: COLONOSCOPY;  Surgeon: Earline Mayotte, MD;  Location: [facility] ENDOSCOPY;  Service: Endoscopy;  Laterality: N/A;   COLONOSCOPY WITH ESOPHAGOGASTRODUODENOSCOPY (EGD)     COLONOSCOPY WITH PROPOFOL N/A 08/03/2017   Procedure: COLONOSCOPY WITH PROPOFOL;  Surgeon: Christena Deem, MD;  Location[facility]COPY;  Service: Endoscopy;  Laterality: N/A;   COLONOSCOPY WITH PROPOFOL N/A 10/18/2018   Procedure: COLONOSCOPY WITH PROPOFOL;  Surgeon: Christena Deem, MD;  Location:  [facility] ENDOSCOPY;  Service: Endoscopy;  Laterality: N/A;   ESOPHAGOGASTRODUODENOSCOPY N/A 10/07/2013   Procedure: ESOPHAGOGASTRODUODENOSCOPY (EGD);  Surgeon: West Bali, MD;  Location: [facility] ENDO SUITE;  Service: Endoscopy;  Laterality: N/A;  patient received heparin at 530am given phenergan 25mg  IV 30 minutes before   ESOPHAGOGASTRODUODENOSCOPY     x4   EYE SURGERY     lacrimal gland    FLEXIBLE BRONCHOSCOPY N/A 06/04/2016   Procedure: FLEXIBLE BRONCHOSCOPY;  Surgeon: Yevonne Pax, MD;  Location: [facility] ORS;  Service: Pulmonary;  Laterality: N/A;   INCONTINENCE SURGERY  2004   NASAL SINUS SURGERY     THYROID LOBECTOMY     TOTAL THYROIDECTOMY     Social History   Social History Narrative   Not on file   Immunization History  Administered Date(s) Administered   Influenza Inj Mdck Quad Pf 07/22/2018, 07/05/2019, 06/27/2020, 07/19/2021    Influenza-Unspecified 08/17/2017   Moderna Sars-Covid-2 Vaccination 12/03/2019, 12/23/2019, 01/24/2020, 08/13/2020, 05/15/2021   Pneumococcal Conjugate-13 10/18/2020   Tdap 07/27/2019   Zoster Recombinat (Shingrix) 08/21/2017, 12/08/2017     Objective: Vital Signs: BP 109/76 (BP Location: Left Arm, Patient Position: Sitting, Cuff Size: Normal)   Pulse 79   Resp 17   Ht 5\' 5"  (1.651 m)   Wt 187 lb 3.2 oz (84.9 kg)   BMI 31.15 kg/m    Physical Exam Vitals and nursing note reviewed.  Constitutional:      Appearance: She is well-developed.  HENT:     Head: Normocephalic and atraumatic.     Mouth/Throat:     Comments: No parotid swelling or tenderness  Eyes:     Conjunctiva/sclera: Conjunctivae normal.  Cardiovascular:     Rate and Rhythm: Normal rate and regular rhythm.     Heart sounds: Normal heart sounds.  Pulmonary:     Effort: Pulmonary effort is normal.     Breath sounds: Normal breath sounds.  Abdominal:     General: Bowel sounds are normal.     Palpations: Abdomen is soft.  Musculoskeletal:     Cervical back: Normal range of motion.  Lymphadenopathy:     Cervical: No cervical adenopathy.  Skin:    General: Skin is warm and dry.     Capillary Refill: Capillary refill takes less than 2 seconds.  Neurological:     Mental Status: She is alert and oriented to person, place, and time.  Psychiatric:        Behavior: Behavior normal.      Musculoskeletal Exam: C-spine, thoracic spine, and lumbar spine good ROM.  Shoulder joints, elbow joints, wrist joints, MCPs, PIPs, and DIPs good ROM with no synovitis.  Complete fist formation bilaterally.  Hip joints have good ROM with no groin pain.  Knee joints have good ROM with no warmth or effusion noted. Tenderness along the right ankle joint line but no active inflammation.  Left ankle has good ROM with no tenderness or joint swelling.  No tenderness or synovitis of MTP joints.   CDAI Exam: CDAI Score: -- Patient Global: --;  Provider Global: -- Swollen: 0 ; Tender: 1  Joint Exam 03/30/2023      Right  Left  Ankle   Tender        Investigation: No additional findings.  Imaging: No results found.  Recent Labs: Lab Results  Component Value Date   WBC 6.4 09/30/2022   HGB 13.1 09/30/2022   PLT 346 09/30/2022   NA 139 09/30/2022   K 4.4 09/30/2022  CL 108 09/30/2022   CO2 21 09/30/2022   GLUCOSE 89 09/30/2022   BUN 15 09/30/2022   CREATININE 0.87 09/30/2022   BILITOT 0.2 09/30/2022   ALKPHOS 79 08/22/2020   AST 17 09/30/2022   ALT 21 09/30/2022   PROT 7.5 09/30/2022   ALBUMIN 4.4 08/22/2020   CALCIUM 9.4 09/30/2022   GFRAA 80 03/06/2021    Speciality Comments: PLQ Eye Exam: 10/02/2022 WNL @ [facility] Follow up in 1 year  Procedures:  No procedures performed Allergies: Aspirin, Keflex [cephalexin], Azithromycin, Barley grass, Clarithromycin, Metoclopramide, Oat, Oatmeal, Other, Rye grass flower pollen extract [gramineae pollens], Vioxx [rofecoxib], Wheat, Wheat extract, Celebrex [celecoxib], and Metoclopramide hcl    Assessment / Plan:     Visit Diagnoses: Sjogren's syndrome with other organ involvement (HCC) - Diagnosed at [facility] 12 years ago and was placed on Plaquenil.  Hx of dry mouth, dental decay and dry eyes.  ANA positive, Ro negative, La negative, RF negative: She continues to have chronic sicca symptoms.  She has been seeing the dentist every 6 months and the ophthalmologist on a yearly basis.  She has no parotid swelling or tenderness at this time.  No cervical lymphadenopathy.  Discussed the increased risk for developing lymphoma in patients with Sjogren's syndrome.  Plan on updating sed rate, SPEP, and CBC with differential today. She remains on Plaquenil 200 mg 1 tablet by mouth twice daily.  She is tolerating Plaquenil without any side effects.  She has found Plaquenil to be very effective at managing her symptoms of inflammatory arthritis.  She has had less inflammation  in both hands.  No synovitis was noted on examination today. Lab work from 09/30/22 was reviewed today in the office: complements WNL, ESR WNL, and dsDNA is negative. The following lab work will be updated today.  She will remain on Plaquenil as prescribed.  She was advised to notify us if she develops signs or symptoms of a flare.  She will follow-up in the office in 5 months or sooner if needed. - Plan: Protein / creatinine ratio, urine, CBC with Differential/Platelet, COMPLETE METABOLIC PANEL WITH GFR, Anti-DNA antibody, double-stranded, C3 and C4, Sedimentation rate, ANA, Serum protein electrophoresis with reflex, hydroxychloroquine (PLAQUENIL) 200 MG tablet  High risk medication use - Plaquenil 200 mg 1 tablet by mouth twice daily.  She restarted plaquenil in May 2022.  PLQ Eye Exam: 10/02/2022 WNL @ [facility] Follow up in 1 year  CBC and CMP WNL on 09/30/22.  Orders for CBC and CMP released today.  - Plan: CBC with Differential/Platelet, COMPLETE METABOLIC PANEL WITH GFR  Chronic right shoulder pain - X-ray of the shoulder joint was unremarkable.  The range of motion of the right shoulder joint on examination today.  Primary osteoarthritis of both hands: CMC, PIP, DIP thickening consistent with osteoarthritis of both hands.  No synovitis noted on examination today.  Primary osteoarthritis of both knees: Good range of motion of both knee joints on examination.  No warmth or effusion noted.  Primary osteoarthritis of both feet: Patient continues to experience chronic pain in the right ankle joint.  She notices ongoing joint swelling.  She recently took a prednisone taper for a sinus infection and noticed improvement in the inflammation in her right ankle while taking prednisone.  She has not noticed any improvement in the right ankle while taking Plaquenil.  Discussed that if her symptoms persist or worsen I would recommend following up with podiatry for further evaluation.  If  her  symptoms are consistent with inflammatory arthritis we will need to discuss other treatment options at her follow-up visit.  Fibromyalgia: She experiences intermittent myalgias and muscle tenderness due to fibromyalgia.  Discussed the importance of regular exercise and good sleep hygiene.  Chronic left-sided low back pain without sciatica: No symptoms of radiculopathy at this time.  Chronic pain syndrome: She takes Norco as needed for pain relief.  Other medical conditions are listed as follows:   CD (celiac disease)  Nutcracker esophagus  Esophageal spasm  History of gastroesophageal reflux (GERD)  MDD (major depressive disorder), recurrent, in partial remission (HCC)  History of thyroid cancer  Chronic obstructive pulmonary disease, unspecified COPD type (HCC)  Generalized anxiety disorder  History of asthma  Vertigo  Family history of rheumatoid arthritis    Orders: Orders Placed This Encounter  Procedures   Protein / creatinine ratio, urine   CBC with Differential/Platelet   COMPLETE METABOLIC PANEL WITH GFR   Anti-DNA antibody, double-stranded   C3 and C4   Sedimentation rate   ANA   Serum protein electrophoresis with reflex   Meds ordered this encounter  Medications   hydroxychloroquine (PLAQUENIL) 200 MG tablet    Sig: Take 1 tablet (200 mg total) by mouth 2 (two) times daily.    Dispense:  180 tablet    Refill:  0    Order Specific Question:   Supervising Provider    Answer:   Pollyann Savoy [[id number]]      Follow-Up Instructions: Return in about 5 months (around 08/30/2023) for Sjogren's syndrome, Osteoarthritis, Fibromyalgia.   Susan Bienenstock, PA-C  Note - This record has been created using Dragon software.  Chart creation errors have been sought, but may not always  have been located. Such creation errors do not reflect on  the standard of medical care.

## 2023-03-17 ENCOUNTER — Other Ambulatory Visit: Payer: Self-pay | Admitting: Nurse Practitioner

## 2023-03-19 DIAGNOSIS — Z6831 Body mass index (BMI) 31.0-31.9, adult: Secondary | ICD-10-CM | POA: Diagnosis not present

## 2023-03-19 DIAGNOSIS — E668 Other obesity: Secondary | ICD-10-CM | POA: Diagnosis not present

## 2023-03-19 DIAGNOSIS — J0100 Acute maxillary sinusitis, unspecified: Secondary | ICD-10-CM | POA: Diagnosis not present

## 2023-03-25 ENCOUNTER — Telehealth: Payer: Medicare HMO | Admitting: Psychiatry

## 2023-03-25 NOTE — Progress Notes (Signed)
Virtual Visit via Video Note  I connected with Susan [name] on 04/01/23 at 11:30 AM EDT by a video enabled telemedicine application and verified that I am speaking with the correct person using two identifiers.  Location: Patient: home Provider: office Persons participated in the visit- patient, provider    I discussed the limitations of evaluation and management by telemedicine and the availability of in person appointments. The patient expressed understanding and agreed to proceed.    I discussed the assessment and treatment plan with the patient. The patient was provided an opportunity to ask questions and all were answered. The patient agreed with the plan and demonstrated an understanding of the instructions.   The patient was advised to call back or seek an in-person evaluation if the symptoms worsen or if the condition fails to improve as anticipated.  I provided 15 minutes of non-face-to-face time during this encounter.   Neysa Hotter, MD    [facility] MD/PA/NP OP Progress Note  04/01/2023 12:09 PM [name]  MRN:  [id number]  Chief Complaint:  Chief Complaint  Patient presents with   Follow-up   HPI:  This is a follow up appointment for depression and anxiety.  She states that she has been doing well.  She went to [place] after they visited [place].  [place]  She went there with her husband for 50th years anniversary and her brother's family.  She was getting sick prior to going to [place], and she has been trying to recuperate from it.  She struggles with fatigue, and she slept most of the day yesterday.  She experiences foot pain secondary to plantar fasciitis.  She continues to have xerostomia.  Although she tried to come off amitriptyline in the past, it caused worsening in pain, and it did not help for xerostomia.  She continues to take Xanax for anxiety.  She does not want to make any change in her medication as these combination has been working very  good.  She denies feeling depressed.  She denies change in appetite.  She denies SI.     Substance use  Tobacco Alcohol Other substances/  Current denies denies denies  Past     Past Treatment        Visit Diagnosis:    ICD-10-CM   1. Generalized anxiety disorder  F41.1 amitriptyline (ELAVIL) 25 MG tablet    2. MDD (major depressive disorder), recurrent, in full remission (HCC)  F33.42 amitriptyline (ELAVIL) 25 MG tablet      Past Psychiatric History: Please see initial evaluation for full details. I have reviewed the history. No updates at this time.     Past Medical History:  Past Medical History:  Diagnosis Date   Arthritis    Asthma    Asthmatic bronchitis    BRCA negative 05/2014   MyRisk neg   Celiac disease    Celiac sprue    Chronic anxiety    Complication of anesthesia    vomiting   COPD (chronic obstructive pulmonary disease) (HCC)    Depression    Environmental allergies    Family history of breast cancer 05/2014   MyRisk neg; IBIS=29.5%   Fibromyalgia    neuropathy all over   Gastritis 10/08/2013   GERD (gastroesophageal reflux disease)    Hyperlipemia    Hyperlipidemia    Intolerant to statins   Hypothyroidism (acquired)    Increased risk of breast cancer 05/2014   IBIS=29.5%   Increased risk of breast cancer  05/2014   IBIS=29.5%   Migraine    Migraines    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     Past Surgical History:  Procedure Laterality Date   ABDOMINAL HYSTERECTOMY  2006   BLADDER REPAIR     BREAST CYST ASPIRATION Left yrs ago   CHOLECYSTECTOMY  1989   COLONOSCOPY N/A 10/16/2021   Procedure: COLONOSCOPY;  Surgeon: Earline Mayotte, MD;  Location: [facility] ENDOSCOPY;  Service: Endoscopy;  Laterality: N/A;   COLONOSCOPY WITH ESOPHAGOGASTRODUODENOSCOPY (EGD)     COLONOSCOPY WITH PROPOFOL N/A  08/03/2017   Procedure: COLONOSCOPY WITH PROPOFOL;  Surgeon: Christena Deem, MD;  Location: [facility] ENDOSCOPY;  Service: Endoscopy;  Laterality: N/A;   COLONOSCOPY WITH PROPOFOL N/A 10/18/2018   Procedure: COLONOSCOPY WITH PROPOFOL;  Surgeon: Christena Deem, MD;  Location: [facility] ENDOSCOPY;  Service: Endoscopy;  Laterality: N/A;   ESOPHAGOGASTRODUODENOSCOPY N/A 10/07/2013   Procedure: ESOPHAGOGASTRODUODENOSCOPY (EGD);  Surgeon: West Bali, MD;  Location: [facility]O [facility];  Service: Endoscopy;  Laterality: N/A;  patient received heparin at 530am given phenergan 25mg  IV 30 minutes before   ESOPHAGOGASTRODUODENOSCOPY     x4   EYE SURGERY     lacrimal gland    FLEXIBLE BRONCHOSCOPY N/A 06/04/2016   Procedure: FLEXIBLE BRONCHOSCOPY;  Surgeon: Yevonne Pax, MD;  Location: [facility] ORS;  Service: Pulmonary;  Laterality: N/A;   INCONTINENCE SURGERY  2004   NASAL SINUS SURGERY     THYROID LOBECTOMY     TOTAL THYROIDECTOMY      Family Psychiatric History: Please see initial evaluation for full details. I have reviewed the history. No updates at this time.     Family History:  Family History  Problem Relation Age of Onset   Arthritis/Rheumatoid Mother        died in age 17s   Breast cancer Mother 3   Breast cancer Maternal Aunt        X 2. 50's   Breast cancer Maternal Aunt    Lung cancer Maternal Aunt    Breast cancer Maternal Aunt    Breast cancer Maternal Grandmother        76's   Pancreatitis Neg Hx    Colon cancer Neg Hx     Social History:  Social History   Socioeconomic History   Marital status: Married    Spouse name: Not on file   Number of children: 0   Years of education: Not on file   Highest education level: Not on file  Occupational History   Not on file  Tobacco Use   Smoking status: Never    Passive exposure: Past   Smokeless tobacco: Never  Vaping Use   Vaping Use: Never used  Substance and Sexual Activity   Alcohol use: No   Drug use: No   Sexual  activity: Not Currently    Birth control/protection: Surgical  Other Topics Concern   Not on file  Social History Narrative   Not on file   Social Determinants of Health   Financial Resource Strain: Low Risk  (03/07/2021)   Overall Financial Resource Strain (CARDIA)    Difficulty of Paying Living Expenses: Not very hard  Food Insecurity: Not on file  Transportation Needs: Not on file  Physical Activity: Not on file  Stress: Not on file  Social Connections: Not on file  Allergies:  Allergies  Allergen Reactions   Aspirin    Keflex [Cephalexin] Shortness Of Breath   Azithromycin Hives and Nausea Only   Barley Grass    Clarithromycin Other (See Comments)   Metoclopramide Other (See Comments)    TREMORS Tremors Tremors    Oat Other (See Comments)   Oatmeal Other (See Comments)   Other     Other reaction(s): Other (See Comments) KEOPLEX.   Rye Grass Flower Pollen Extract [Gramineae Pollens]    Vioxx [Rofecoxib] Diarrhea   Wheat Other (See Comments)    Celiac disease   Wheat Extract    Celebrex [Celecoxib] Hives   Metoclopramide Hcl Anxiety    BODY TREMORS    Metabolic Disorder Labs: No results found for: "HGBA1C", "MPG" No results found for: "PROLACTIN" Lab Results  Component Value Date   CHOL 188 04/16/2021   TRIG 135 04/16/2021   HDL 51 04/16/2021   CHOLHDL 3.7 04/16/2021   VLDL 22 03/23/2020   LDLCALC 113 (H) 04/16/2021   LDLCALC 118 (H) 03/23/2020   Lab Results  Component Value Date   TSH 2.640 09/05/2021   TSH 0.056 (L) 04/16/2021    Therapeutic Level Labs: No results found for: "LITHIUM" No results found for: "VALPROATE" No results found for: "CBMZ"  Current Medications: Current Outpatient Medications  Medication Sig Dispense Refill   acetaminophen (TYLENOL) 500 MG tablet Take by mouth as needed.     albuterol (PROVENTIL) (2.5 MG/3ML) 0.083% nebulizer solution Take 3 mLs (2.5 mg total) by nebulization every 6 (six) hours as needed for  wheezing or shortness of breath. 75 mL 6   albuterol (VENTOLIN HFA) 108 (90 Base) MCG/ACT inhaler Inhale 2 puffs into the lungs every 6 (six) hours as needed for wheezing or shortness of breath. 1 each 2   ALPRAZolam (XANAX) 0.5 MG tablet Take 1 tablet (0.5 mg total) by mouth 3 (three) times daily as needed. for anxiety 90 tablet 0   [START ON 05/04/2023] amitriptyline (ELAVIL) 25 MG tablet Take 1 tablet (25 mg total) by mouth at bedtime. 90 tablet 1   Ascorbic Acid (VITAMIN C) 100 MG tablet Take 100 mg by mouth daily.      azelastine (ASTELIN) 0.1 % nasal spray Place 2 sprays into both nostrils 2 (two) times daily. Use in each nostril as directed     carisoprodol (SOMA) 350 MG tablet Take 350 mg by mouth at bedtime. Patient states that she takes 1/2 tablet nightly      cyanocobalamin (VITAMIN B12) 1000 MCG/ML injection INJECT INTRAMUSCULARLY EACH MONTH 1 mL 3   Dentifrices (BIOTENE DRY MOUTH DT) by Transmucosal route.     eletriptan (RELPAX) 40 MG tablet Take 40 mg by mouth as needed for migraine. may repeat in 2 hours if necessary      EPINEPHRINE 0.3 mg/0.3 mL IJ SOAJ injection INJECT CONTENTS OF 1 PEN AS NEEDED FOR ALLERGIC REACTION 2 each 1   estradiol (CLIMARA) 0.05 mg/24hr patch APPLY 1 PATCH TO SKIN ONCE WEEKLY 12 patch 3   estradiol (ESTRACE VAGINAL) 0.1 MG/GM vaginal cream Insert 1 gram (or desired amount on finger) vaginally 2-3 times weekly 42.5 g 2   Estradiol (YUVAFEM) 10 MCG TABS vaginal tablet Place 1 tablet (10 mcg total) vaginally 2 (two) times a week. 24 tablet 3   ezetimibe (ZETIA) 10 MG tablet Take 1 tablet by mouth once daily 90 tablet 0   fenofibrate (TRICOR) 145 MG tablet Take 1 tablet (145 mg total) by mouth daily.  90 tablet 1   fluticasone (FLONASE) 50 MCG/ACT nasal spray Place 2 sprays into both nostrils daily. 16 g 3   fluticasone furoate-vilanterol (BREO ELLIPTA) 100-25 MCG/ACT AEPB Inhale 1 puff into the lungs daily. 180 each 3   folic acid (FOLVITE) 1 MG tablet Take  1 tablet (1 mg total) by mouth daily. 90 tablet 1   gabapentin (NEURONTIN) 600 MG tablet Take 1/2 (one-half) tablet by mouth daily 45 tablet 3   HYDROcodone-acetaminophen (NORCO) 7.5-325 MG tablet One tab po tid for pain prn only 30 tablet 0   hydroxychloroquine (PLAQUENIL) 200 MG tablet Take 1 tablet (200 mg total) by mouth 2 (two) times daily. 180 tablet 0   hyoscyamine (ANASPAZ) 0.125 MG TBDP disintergrating tablet Place 0.125 mg under the tongue every 4 (four) hours as needed for bladder spasms.      Lactobacillus (DIGESTIVE HEALTH PROBIOTIC PO) Take 1 tablet by mouth daily.      levothyroxine (SYNTHROID) 125 MCG tablet Take 1 tablet (125 mcg total) by mouth daily. 90 tablet 3   LINZESS 290 MCG CAPS capsule Take 1 capsule (290 mcg total) by mouth daily. 90 capsule 3   Magnesium Oxide (NATRUL MAGNESIUM PO) Take by mouth.      montelukast (SINGULAIR) 10 MG tablet TAKE 1 TABLET BY MOUTH ONCE DAILY FOR ASTHMA 90 tablet 0   Multiple Vitamins-Minerals (ZINC PO) Take by mouth daily.     mupirocin ointment (BACTROBAN) 2 % as needed.  0   pantoprazole (PROTONIX) 40 MG tablet Take 40 mg by mouth daily.      Polyethyl Glycol-Propyl Glycol 0.4-0.3 % SOLN Apply to eye.      Probiotic Product (PHILLIPS COLON HEALTH) CAPS Take by mouth.      sodium chloride (OCEAN) 0.65 % SOLN nasal spray Place 1 spray into both nostrils as needed for congestion.      sodium chloride 0.9 % nebulizer solution      SUMAtriptan-naproxen (TREXIMET) 85-500 MG tablet daily as needed.      SYRINGE-NEEDLE, DISP, 3 ML (B-D 3CC LUER-LOK SYR 25GX5/8") 25G X 5/8" 3 ML MISC USE AS DIRECTED ONCE  A  MONTH 1 each 0   Tuberculin-Allergy Syringes 28G X 1/2" 1 ML MISC 1 each by Does not apply route every 14 (fourteen) days. For allergy injections 12 each 3   venlafaxine XR (EFFEXOR-XR) 150 MG 24 hr capsule Take 2 capsules (300 mg total) by mouth at bedtime. 180 capsule 1   Vitamin D, Ergocalciferol, (DRISDOL) 1.25 MG (50000 UNIT) CAPS  capsule Take 1 capsule (50,000 Units total) by mouth every 7 (seven) days. Monday 12 capsule 1   vitamin E 1000 UNIT capsule Take by mouth.      No current facility-administered medications for this visit.     Musculoskeletal: Strength & Muscle Tone:  N/A Gait & Station:  N/A Patient leans: N/A  Psychiatric Specialty Exam: Review of Systems  Psychiatric/Behavioral:  Negative for agitation, behavioral problems, confusion, decreased concentration, dysphoric mood, hallucinations, self-injury, sleep disturbance and suicidal ideas. The patient is nervous/anxious. The patient is not hyperactive.   All other systems reviewed and are negative.   There were no vitals taken for this visit.There is no height or weight on file to calculate BMI.  General Appearance: Fairly Groomed  Eye Contact:  Good  Speech:  Clear and Coherent  Volume:  Normal  Mood:   good  Affect:  Appropriate, Congruent, and calm  Thought Process:  Coherent  Orientation:  Full (Time,  Place, and Person)  Thought Content: Logical   Suicidal Thoughts:  No  Homicidal Thoughts:  No  Memory:  Immediate;   Good  Judgement:  Good  Insight:  Good  Psychomotor Activity:  Normal  Concentration:  Concentration: Good and Attention Span: Good  Recall:  Good  Fund of Knowledge: Good  Language: Good  Akathisia:  No  Handed:  Right  AIMS (if indicated): not done  Assets:  Communication Skills Desire for Improvement  ADL's:  Intact  Cognition: WNL  Sleep:  Fair   Screenings: Mini-Mental    Flowsheet Row Office Visit from 04/02/2022 in [facility], [facility] Clinical Support from 03/26/2021 in [facility], [facility] Clinical Support from 03/22/2020 in [facility], [facility] Clinical Support from 03/22/2019 in [facility], [facility]  Total Score (max 30 points ) 27 30 30 30       [id number]    Flowsheet Row Office Visit from 04/02/2022 in [facility], [facility] Office Visit from 03/03/2022 in [facility] Video Visit from 10/09/2021 in [facility] Office Visit from 09/24/2021 in [facility], [facility] Office Visit from 07/19/2021 in [facility], [facility]  PHQ-2 Total Score 0 1 1 1  0      Flowsheet Row Admission (Discharged) from 10/16/2021 in [facility]  C-SSRS RISK CATEGORY No Risk        Assessment and Plan:  Susan [name] is a 67 y.o. year old female with a history of depression, anxiety, Sjogren's syndrome on hydroxychloroquine, bilateral osteoarthritis, COPD, s/p thyroidectomy, hypothyroidism, OSA (on CPAP),  history of Celiac Disease, GERD,  , who presents for follow up appointment for below.   1. Generalized anxiety disorder 2. MDD (major depressive disorder), recurrent, in full remission (HCC) Acute stressors include:  Other stressors include: loss of her close friends, sister in law, parents    History:  Tx from Dr. Omelia Blackwater. Anxiety worsened after she had a dental procedure when she was a child. Originally on venlafaxine 300 mg daily, amitriptyline 25 mg at night, xanax 0.5 mg BID She denies any significant depressive symptoms and anxiety, except that chronic fatigue, and she self-limiting anxiety, which requires her to use Xanax regularly.  She enjoys traveling with her husband, and denies any concern.  Although it was discussed to adjust her medication given her chronic use of Xanax, she has strong preference to stay on the current medication regimen.  She reports significant benefit from amitriptyline or fibromyalgia, and she could not tolerate discontinuation of this medication without any change in xerostomia, although she has reportedly tried a few times in the past.  Will continue current dose of amitriptyline and venlafaxine to target depression and anxiety.   2. Cognitive decline Although OT referral was sent for evaluation given her reported  memory loss and occasional issues with executive functioning, she declined this referral when she was contacted.  She was advised again to obtain labs to rule out medical health issues contributing to cognitive impairment. Noted that although she reports good benefit from Ritalin in the past, it was reportedly discontinued due to adverse reaction.      Plan Continue venlafaxine 300 mg daily  Continue amitriptyline 25 mg at night (anticholinergic side effect from higher dose) HR 74, qtc 446 msec 09/2022 Obtain labs (TSH, vit B12, folate)- she would like her PCP to do this Next appointment: 12/4 at 11 30 for 30 mins, video - on  carisoprodol (Soma), gabapentin 300 mg BID - on xanax 0.5 mg  TID, prescribed by Dr. Gerilyn Pilgrim   Past trials of medication: sertraline, Paxil, venlafaxine, Xanax, Ritalin (worked very well)    The patient demonstrates the following risk factors for suicide: Chronic risk factors for suicide include: psychiatric disorder of anxiety. Acute risk factors for suicide include: N/A. Protective factors for this patient include: positive social support, responsibility to others (children, family), coping skills and hope for the future. Considering these factors, the overall suicide risk at this point appears to be low. She owns a gun. Patient is appropriate for outpatient follow up.  Collaboration of Care: Collaboration of Care: Other reviewed notes in Epic  Patient/Guardian was advised Release of Information must be obtained prior to any record release in order to collaborate their care with an outside provider. Patient/Guardian was advised if they have not already done so to contact the registration department to sign all necessary forms in order for [place] to release information regarding their care.   Consent: Patient/Guardian gives verbal consent for treatment and assignment of benefits for services provided during this visit. Patient/Guardian expressed understanding and agreed to  proceed.    Neysa Hotter, MD 04/01/2023, 12:09 PM

## 2023-03-27 ENCOUNTER — Other Ambulatory Visit: Payer: Self-pay | Admitting: Nurse Practitioner

## 2023-03-27 DIAGNOSIS — E785 Hyperlipidemia, unspecified: Secondary | ICD-10-CM

## 2023-03-30 ENCOUNTER — Encounter: Payer: Self-pay | Admitting: Physician Assistant

## 2023-03-30 ENCOUNTER — Ambulatory Visit: Payer: Medicare HMO | Attending: Physician Assistant | Admitting: Physician Assistant

## 2023-03-30 VITALS — BP 109/76 | HR 79 | Resp 17 | Ht 65.0 in | Wt 187.2 lb

## 2023-03-30 DIAGNOSIS — G8929 Other chronic pain: Secondary | ICD-10-CM

## 2023-03-30 DIAGNOSIS — M19041 Primary osteoarthritis, right hand: Secondary | ICD-10-CM

## 2023-03-30 DIAGNOSIS — M19071 Primary osteoarthritis, right ankle and foot: Secondary | ICD-10-CM

## 2023-03-30 DIAGNOSIS — M3509 Sicca syndrome with other organ involvement: Secondary | ICD-10-CM | POA: Diagnosis not present

## 2023-03-30 DIAGNOSIS — K224 Dyskinesia of esophagus: Secondary | ICD-10-CM | POA: Diagnosis not present

## 2023-03-30 DIAGNOSIS — Z85850 Personal history of malignant neoplasm of thyroid: Secondary | ICD-10-CM

## 2023-03-30 DIAGNOSIS — M25511 Pain in right shoulder: Secondary | ICD-10-CM | POA: Diagnosis not present

## 2023-03-30 DIAGNOSIS — G894 Chronic pain syndrome: Secondary | ICD-10-CM | POA: Diagnosis not present

## 2023-03-30 DIAGNOSIS — M797 Fibromyalgia: Secondary | ICD-10-CM | POA: Diagnosis not present

## 2023-03-30 DIAGNOSIS — M170 Bilateral primary osteoarthritis of knee: Secondary | ICD-10-CM

## 2023-03-30 DIAGNOSIS — K900 Celiac disease: Secondary | ICD-10-CM

## 2023-03-30 DIAGNOSIS — Z8709 Personal history of other diseases of the respiratory system: Secondary | ICD-10-CM

## 2023-03-30 DIAGNOSIS — F411 Generalized anxiety disorder: Secondary | ICD-10-CM

## 2023-03-30 DIAGNOSIS — Z79899 Other long term (current) drug therapy: Secondary | ICD-10-CM | POA: Diagnosis not present

## 2023-03-30 DIAGNOSIS — R42 Dizziness and giddiness: Secondary | ICD-10-CM

## 2023-03-30 DIAGNOSIS — M19072 Primary osteoarthritis, left ankle and foot: Secondary | ICD-10-CM

## 2023-03-30 DIAGNOSIS — J449 Chronic obstructive pulmonary disease, unspecified: Secondary | ICD-10-CM

## 2023-03-30 DIAGNOSIS — Z8261 Family history of arthritis: Secondary | ICD-10-CM

## 2023-03-30 DIAGNOSIS — Z8719 Personal history of other diseases of the digestive system: Secondary | ICD-10-CM | POA: Diagnosis not present

## 2023-03-30 DIAGNOSIS — M19042 Primary osteoarthritis, left hand: Secondary | ICD-10-CM

## 2023-03-30 DIAGNOSIS — M545 Low back pain, unspecified: Secondary | ICD-10-CM

## 2023-03-30 DIAGNOSIS — F3341 Major depressive disorder, recurrent, in partial remission: Secondary | ICD-10-CM

## 2023-03-30 MED ORDER — HYDROXYCHLOROQUINE SULFATE 200 MG PO TABS: 200.0000 mg | ORAL_TABLET | Freq: Two times a day (BID) | ORAL | 0 refills | Status: DC

## 2023-03-31 NOTE — Progress Notes (Signed)
Complements WNL

## 2023-03-31 NOTE — Progress Notes (Signed)
No proteinuria.  CBC and CMP WNL  ESR WNL

## 2023-04-01 ENCOUNTER — Telehealth (INDEPENDENT_AMBULATORY_CARE_PROVIDER_SITE_OTHER): Payer: Medicare HMO | Admitting: Psychiatry

## 2023-04-01 ENCOUNTER — Encounter: Payer: Self-pay | Admitting: Psychiatry

## 2023-04-01 DIAGNOSIS — F3342 Major depressive disorder, recurrent, in full remission: Secondary | ICD-10-CM

## 2023-04-01 DIAGNOSIS — F411 Generalized anxiety disorder: Secondary | ICD-10-CM | POA: Diagnosis not present

## 2023-04-01 MED ORDER — AMITRIPTYLINE HCL 25 MG PO TABS: 25.0000 mg | ORAL_TABLET | Freq: Every day | ORAL | 1 refills | Status: DC

## 2023-04-01 NOTE — Progress Notes (Signed)
dsDNA is negative

## 2023-04-01 NOTE — Progress Notes (Signed)
ANA remains positive.   SPEP pending

## 2023-04-02 LAB — PROTEIN / CREATININE RATIO, URINE
Creatinine, Urine: 29 mg/dL (ref 20–275)
Total Protein, Urine: <4 mg/dL — ABNORMAL LOW (ref 5–24)

## 2023-04-02 LAB — COMPLETE METABOLIC PANEL WITH GFR
AG Ratio: 1.5 (calc) (ref 1.0–2.5)
ALT: 25 U/L (ref 6–29)
AST: 18 U/L (ref 10–35)
Albumin: 4.3 g/dL (ref 3.6–5.1)
Alkaline phosphatase (APISO): 64 U/L (ref 37–153)
BUN: 12 mg/dL (ref 7–25)
CO2: 20 mmol/L (ref 20–32)
Calcium: 9.1 mg/dL (ref 8.6–10.4)
Chloride: 107 mmol/L (ref 98–110)
Creat: 0.87 mg/dL (ref 0.50–1.05)
Globulin: 2.9 g/dL (calc) (ref 1.9–3.7)
Glucose, Bld: 93 mg/dL (ref 65–99)
Potassium: 4.4 mmol/L (ref 3.5–5.3)
Sodium: 139 mmol/L (ref 135–146)
Total Bilirubin: 0.2 mg/dL (ref 0.2–1.2)
Total Protein: 7.2 g/dL (ref 6.1–8.1)
eGFR: 73 mL/min/{1.73_m2} (ref >=60)

## 2023-04-02 LAB — CBC WITH DIFFERENTIAL/PLATELET
Absolute Monocytes: 572 cells/uL (ref 200–950)
Basophils Absolute: 59 cells/uL (ref 0–200)
Basophils Relative: 0.9 %
Eosinophils Absolute: 312 cells/uL (ref 15–500)
Eosinophils Relative: 4.8 %
HCT: 38.2 % (ref 35.0–45.0)
Hemoglobin: 12.8 g/dL (ref 11.7–15.5)
Lymphs Abs: 2509 cells/uL (ref 850–3900)
MCH: 29.8 pg (ref 27.0–33.0)
MCHC: 33.5 g/dL (ref 32.0–36.0)
MCV: 89 fL (ref 80.0–100.0)
MPV: 9.6 fL (ref 7.5–12.5)
Monocytes Relative: 8.8 %
Neutro Abs: 3049 cells/uL (ref 1500–7800)
Neutrophils Relative %: 46.9 %
Platelets: 377 10*3/uL (ref 140–400)
RBC: 4.29 10*6/uL (ref 3.80–5.10)
RDW: 12.2 % (ref 11.0–15.0)
Total Lymphocyte: 38.6 %
WBC: 6.5 10*3/uL (ref 3.8–10.8)

## 2023-04-02 LAB — PROTEIN ELECTROPHORESIS, SERUM, WITH REFLEX
Albumin ELP: 4.1 g/dL (ref 3.8–4.8)
Alpha 1: 0.3 g/dL (ref 0.2–0.3)
Alpha 2: 0.6 g/dL (ref 0.5–0.9)
Beta 2: 0.5 g/dL (ref 0.2–0.5)
Beta Globulin: 0.5 g/dL (ref 0.4–0.6)
Gamma Globulin: 1.2 g/dL (ref 0.8–1.7)
Total Protein: 7.2 g/dL (ref 6.1–8.1)

## 2023-04-02 LAB — SEDIMENTATION RATE: Sed Rate: 9 mm/h (ref 0–30)

## 2023-04-02 LAB — ANTI-NUCLEAR AB-TITER (ANA TITER): ANA Titer 1: 1:160 {titer} — ABNORMAL HIGH

## 2023-04-02 LAB — ANA: Anti Nuclear Antibody (ANA): POSITIVE — AB

## 2023-04-02 LAB — C3 AND C4
C3 Complement: 162 mg/dL (ref 83–193)
C4 Complement: 25 mg/dL (ref 15–57)

## 2023-04-02 LAB — ANTI-DNA ANTIBODY, DOUBLE-STRANDED: ds DNA Ab: <1 IU/mL

## 2023-04-02 NOTE — Progress Notes (Signed)
SPEP did not reveal any abnormal protein bands.

## 2023-04-08 ENCOUNTER — Ambulatory Visit (INDEPENDENT_AMBULATORY_CARE_PROVIDER_SITE_OTHER): Payer: Medicare HMO | Admitting: Nurse Practitioner

## 2023-04-08 ENCOUNTER — Encounter: Payer: Self-pay | Admitting: Nurse Practitioner

## 2023-04-08 ENCOUNTER — Telehealth: Payer: Self-pay

## 2023-04-08 VITALS — BP 130/78 | HR 74 | Temp 98.3°F | Resp 16 | Ht 65.0 in | Wt 189.0 lb

## 2023-04-08 DIAGNOSIS — R300 Dysuria: Secondary | ICD-10-CM | POA: Diagnosis not present

## 2023-04-08 DIAGNOSIS — E559 Vitamin D deficiency, unspecified: Secondary | ICD-10-CM

## 2023-04-08 DIAGNOSIS — Z0001 Encounter for general adult medical examination with abnormal findings: Secondary | ICD-10-CM | POA: Diagnosis not present

## 2023-04-08 DIAGNOSIS — E782 Mixed hyperlipidemia: Secondary | ICD-10-CM

## 2023-04-08 DIAGNOSIS — E039 Hypothyroidism, unspecified: Secondary | ICD-10-CM

## 2023-04-08 DIAGNOSIS — Z79899 Other long term (current) drug therapy: Secondary | ICD-10-CM | POA: Diagnosis not present

## 2023-04-08 DIAGNOSIS — E538 Deficiency of other specified B group vitamins: Secondary | ICD-10-CM

## 2023-04-08 MED ORDER — HYDROCODONE-ACETAMINOPHEN 7.5-325 MG PO TABS: ORAL_TABLET | ORAL | 0 refills | Status: DC

## 2023-04-08 MED ORDER — EZETIMIBE 10 MG PO TABS: 10.0000 mg | ORAL_TABLET | Freq: Every day | ORAL | 3 refills | Status: DC

## 2023-04-08 MED ORDER — LEVOTHYROXINE SODIUM 125 MCG PO TABS: 125.0000 ug | ORAL_TABLET | Freq: Every day | ORAL | 3 refills | Status: DC

## 2023-04-08 MED ORDER — FLUTICASONE FUROATE-VILANTEROL 100-25 MCG/ACT IN AEPB: 1.0000 | INHALATION_SPRAY | Freq: Every day | RESPIRATORY_TRACT | 3 refills | Status: DC

## 2023-04-08 MED ORDER — MONTELUKAST SODIUM 10 MG PO TABS: ORAL_TABLET | ORAL | 0 refills | Status: DC

## 2023-04-08 MED ORDER — FENOFIBRATE 145 MG PO TABS: 145.0000 mg | ORAL_TABLET | Freq: Every day | ORAL | 1 refills | Status: DC

## 2023-04-08 MED ORDER — ALPRAZOLAM 0.5 MG PO TABS: 0.5000 mg | ORAL_TABLET | Freq: Three times a day (TID) | ORAL | 0 refills | Status: DC | PRN

## 2023-04-08 MED ORDER — GABAPENTIN 600 MG PO TABS: ORAL_TABLET | ORAL | 3 refills | Status: DC

## 2023-04-08 MED ORDER — ALBUTEROL SULFATE (2.5 MG/3ML) 0.083% IN NEBU: 2.5000 mg | INHALATION_SOLUTION | Freq: Four times a day (QID) | RESPIRATORY_TRACT | 6 refills | Status: DC | PRN

## 2023-04-08 NOTE — Telephone Encounter (Signed)
[employer] papers fax to [facility] at [phone]

## 2023-04-08 NOTE — Progress Notes (Signed)
[facility] [address] [place], [place] [address]  Internal MEDICINE  Office Visit Note  Patient Name: Susan [name]  [id number]  [id number]  Date of Service: 04/08/2023  Chief Complaint  Patient presents with   Depression   Gastroesophageal Reflux   Hyperlipidemia   Medicare Wellness    HPI Susan [name] presents for an annual well visit and physical exam.  Well-appearing 67 y.o. female with migraine, asthma, COPD, OSA, celiac, GERD, hypothyroidism, essential tremor, fibromyalgia, GAD, depression, and allergic rhinitis. Routine CRC screening: done in 2022, not due again until 2032.  Routine mammogram: done in December 2023 DEXA scan: due now  Labs: due for routine labs  New or worsening pain: none  Other concerns: concern for vitamin deficiencies and anemia, check with labs       04/08/2023    2:03 PM 04/02/2022    3:14 PM 03/26/2021    9:45 AM  MMSE - Mini Mental State Exam  Orientation to time 5 5 5   Orientation to Place 5 5 5   Registration 3 3 3   Attention/ Calculation 5 5 5   Recall 3 0 3  Language- name 2 objects 2 2 2   Language- repeat 1 1 1   Language- follow 3 step command 3 3 3   Language- read & follow direction 1 1 1   Write a sentence 0 1 1  Copy design 1 1 1   Total score 29 27 30     Functional Status Survey: Is the patient deaf or have difficulty hearing?: No Does the patient have difficulty seeing, even when wearing glasses/contacts?: No Does the patient have difficulty concentrating, remembering, or making decisions?: Yes Does the patient have difficulty walking or climbing stairs?: No Does the patient have difficulty dressing or bathing?: No Does the patient have difficulty doing errands alone such as visiting a doctor's office or shopping?: No     09/24/2021    1:28 PM 10/16/2021    8:33 AM 04/02/2022    3:09 PM 10/01/2022    1:18 PM 04/08/2023    2:02 PM  Fall Risk  Falls in the past year? 0  1 1 1   Was there an injury with Fall?   0 0  0  Fall Risk Category Calculator   2 2 1   Fall Risk Category (Retired)   Moderate Moderate   (RETIRED) Patient Fall Risk Level Low fall risk Moderate fall risk Moderate fall risk Moderate fall risk   Patient at Risk for Falls Due to No Fall Risks      Fall risk Follow up Falls evaluation completed  Falls evaluation completed Falls evaluation completed Falls evaluation completed       04/08/2023    2:02 PM  Depression screen PHQ 2/9  Decreased Interest 0  Down, Depressed, Hopeless 0  PHQ - 2 Score 0        Current Medication: Outpatient Encounter Medications as of 04/08/2023  Medication Sig   acetaminophen (TYLENOL) 500 MG tablet Take by mouth as needed.   albuterol (VENTOLIN HFA) 108 (90 Base) MCG/ACT inhaler Inhale 2 puffs into the lungs every 6 (six) hours as needed for wheezing or shortness of breath.   [START ON 05/04/2023] amitriptyline (ELAVIL) 25 MG tablet Take 1 tablet (25 mg total) by mouth at bedtime.   Ascorbic Acid (VITAMIN C) 100 MG tablet Take 100 mg by mouth daily.    azelastine (ASTELIN) 0.1 % nasal spray Place 2 sprays into both nostrils 2 (two) times daily. Use in each nostril  as directed   carisoprodol (SOMA) 350 MG tablet Take 350 mg by mouth at bedtime. Patient states that she takes 1/2 tablet nightly    cyanocobalamin (VITAMIN B12) 1000 MCG/ML injection INJECT INTRAMUSCULARLY EACH MONTH   Dentifrices (BIOTENE DRY MOUTH DT) by Transmucosal route.   eletriptan (RELPAX) 40 MG tablet Take 40 mg by mouth as needed for migraine. may repeat in 2 hours if necessary    estradiol (CLIMARA) 0.05 mg/24hr patch APPLY 1 PATCH TO SKIN ONCE WEEKLY   Estradiol (YUVAFEM) 10 MCG TABS vaginal tablet Place 1 tablet (10 mcg total) vaginally 2 (two) times a week.   fluticasone (FLONASE) 50 MCG/ACT nasal spray Place 2 sprays into both nostrils daily.   folic acid (FOLVITE) 1 MG tablet Take 1 tablet (1 mg total) by mouth daily.   hydroxychloroquine (PLAQUENIL) 200 MG tablet Take 1  tablet (200 mg total) by mouth 2 (two) times daily.   hyoscyamine (ANASPAZ) 0.125 MG TBDP disintergrating tablet Place 0.125 mg under the tongue every 4 (four) hours as needed for bladder spasms.    Lactobacillus (DIGESTIVE HEALTH PROBIOTIC PO) Take 1 tablet by mouth daily.    LINZESS 290 MCG CAPS capsule Take 1 capsule (290 mcg total) by mouth daily.   Magnesium Oxide (NATRUL MAGNESIUM PO) Take by mouth.    Multiple Vitamins-Minerals (ZINC PO) Take by mouth daily.   mupirocin ointment (BACTROBAN) 2 % as needed.   pantoprazole (PROTONIX) 40 MG tablet Take 40 mg by mouth daily.    Polyethyl Glycol-Propyl Glycol 0.4-0.3 % SOLN Apply to eye.    Probiotic Product (PHILLIPS COLON HEALTH) CAPS Take by mouth.    sodium chloride (OCEAN) 0.65 % SOLN nasal spray Place 1 spray into both nostrils as needed for congestion.    SUMAtriptan-naproxen (TREXIMET) 85-500 MG tablet daily as needed.    SYRINGE-NEEDLE, DISP, 3 ML (B-D 3CC LUER-LOK SYR 25GX5/8") 25G X 5/8" 3 ML MISC USE AS DIRECTED ONCE  A  MONTH   venlafaxine XR (EFFEXOR-XR) 150 MG 24 hr capsule Take 2 capsules (300 mg total) by mouth at bedtime.   Vitamin D, Ergocalciferol, (DRISDOL) 1.25 MG (50000 UNIT) CAPS capsule Take 1 capsule (50,000 Units total) by mouth every 7 (seven) days. Monday   vitamin E 1000 UNIT capsule Take by mouth.    [DISCONTINUED] albuterol (PROVENTIL) (2.5 MG/3ML) 0.083% nebulizer solution Take 3 mLs (2.5 mg total) by nebulization every 6 (six) hours as needed for wheezing or shortness of breath.   [DISCONTINUED] ALPRAZolam (XANAX) 0.5 MG tablet Take 1 tablet (0.5 mg total) by mouth 3 (three) times daily as needed. for anxiety   [DISCONTINUED] ezetimibe (ZETIA) 10 MG tablet Take 1 tablet by mouth once daily   [DISCONTINUED] fenofibrate (TRICOR) 145 MG tablet Take 1 tablet (145 mg total) by mouth daily.   [DISCONTINUED] fluticasone furoate-vilanterol (BREO ELLIPTA) 100-25 MCG/ACT AEPB Inhale 1 puff into the lungs daily.    [DISCONTINUED] gabapentin (NEURONTIN) 600 MG tablet Take 1/2 (one-half) tablet by mouth daily   [DISCONTINUED] HYDROcodone-acetaminophen (NORCO) 7.5-325 MG tablet One tab po tid for pain prn only   [DISCONTINUED] levothyroxine (SYNTHROID) 125 MCG tablet Take 1 tablet (125 mcg total) by mouth daily.   [DISCONTINUED] montelukast (SINGULAIR) 10 MG tablet TAKE 1 TABLET BY MOUTH ONCE DAILY FOR ASTHMA   albuterol (PROVENTIL) (2.5 MG/3ML) 0.083% nebulizer solution Take 3 mLs (2.5 mg total) by nebulization every 6 (six) hours as needed for wheezing or shortness of breath.   ALPRAZolam (XANAX) 0.5 MG tablet  Take 1 tablet (0.5 mg total) by mouth 3 (three) times daily as needed. for anxiety   ezetimibe (ZETIA) 10 MG tablet Take 1 tablet (10 mg total) by mouth daily.   fenofibrate (TRICOR) 145 MG tablet Take 1 tablet (145 mg total) by mouth daily.   fluticasone furoate-vilanterol (BREO ELLIPTA) 100-25 MCG/ACT AEPB Inhale 1 puff into the lungs daily.   gabapentin (NEURONTIN) 600 MG tablet Take 1/2 (one-half) tablet by mouth daily   HYDROcodone-acetaminophen (NORCO) 7.5-325 MG tablet One tab po tid for pain prn only   levothyroxine (SYNTHROID) 125 MCG tablet Take 1 tablet (125 mcg total) by mouth daily.   montelukast (SINGULAIR) 10 MG tablet TAKE 1 TABLET BY MOUTH ONCE DAILY FOR ASTHMA   [DISCONTINUED] EPINEPHRINE 0.3 mg/0.3 mL IJ SOAJ injection INJECT CONTENTS OF 1 PEN AS NEEDED FOR ALLERGIC REACTION   [DISCONTINUED] estradiol (ESTRACE VAGINAL) 0.1 MG/GM vaginal cream Insert 1 gram (or desired amount on finger) vaginally 2-3 times weekly   [DISCONTINUED] sodium chloride 0.9 % nebulizer solution    [DISCONTINUED] Tuberculin-Allergy Syringes 28G X 1/2" 1 ML MISC 1 each by Does not apply route every 14 (fourteen) days. For allergy injections   No facility-administered encounter medications on file as of 04/08/2023.    Surgical History: Past Surgical History:  Procedure Laterality Date   ABDOMINAL HYSTERECTOMY   2006   BLADDER REPAIR     BREAST CYST ASPIRATION Left yrs ago   CHOLECYSTECTOMY  1989   COLONOSCOPY N/A 10/16/2021   Procedure: COLONOSCOPY;  Surgeon: Earline Mayotte, MD;  Location: [facility] ENDOSCOPY;  Service: Endoscopy;  Laterality: N/A;   COLONOSCOPY WITH ESOPHAGOGASTRODUODENOSCOPY (EGD)     COLONOSCOPY WITH PROPOFOL N/A 08/03/2017   Procedure: COLONOSCOPY WITH PROPOFOL;  Surgeon: Christena Deem, MD;  Location: [facility] ENDOSCOPY;  Service: Endoscopy;  Laterality: N/A;   COLONOSCOPY WITH PROPOFOL N/A 10/18/2018   Procedure: COLONOSCOPY WITH PROPOFOL;  Surgeon: Christena Deem, MD;  Location: [facility] ENDOSCOPY;  Service: Endoscopy;  Laterality: N/A;   ESOPHAGOGASTRODUODENOSCOPY N/A 10/07/2013   Procedure: ESOPHAGOGASTRODUODENOSCOPY (EGD);  Surgeon: West Bali, MD;  Location: [facility] ENDO SUITE;  Service: Endoscopy;  Laterality: N/A;  patient received heparin at 530am given phenergan 25mg  IV 30 minutes before   ESOPHAGOGASTRODUODENOSCOPY     x4   EYE SURGERY     lacrimal gland    FLEXIBLE BRONCHOSCOPY N/A 06/04/2016   Procedure: FLEXIBLE BRONCHOSCOPY;  Surgeon: Yevonne Pax, MD;  Location: [facility] ORS;  Service: Pulmonary;  Laterality: N/A;   INCONTINENCE SURGERY  2004   NASAL SINUS SURGERY     THYROID LOBECTOMY     TOTAL THYROIDECTOMY      Medical History: Past Medical History:  Diagnosis Date   Arthritis    Asthma    Asthmatic bronchitis    BRCA negative 05/2014   MyRisk neg   Celiac disease    Celiac sprue    Chronic anxiety    Complication of anesthesia    vomiting   COPD (chronic obstructive pulmonary disease) (HCC)    Depression    Environmental allergies    Family history of breast cancer 05/2014   MyRisk neg; IBIS=29.5%   Fibromyalgia    neuropathy all over   Gastritis 10/08/2013   GERD (gastroesophageal reflux disease)    Hyperlipemia    Hyperlipidemia    Intolerant to statins   Hypothyroidism (acquired)    Increased risk of breast cancer 05/2014    IBIS=29.5%   Increased risk of breast cancer 05/2014  IBIS=29.5%   Migraine    Migraines    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     Family History: Family History  Problem Relation Age of Onset   Arthritis/Rheumatoid Mother        died in age 9s   Breast cancer Mother 74   Breast cancer Maternal Aunt        X 2. 50's   Breast cancer Maternal Aunt    Lung cancer Maternal Aunt    Breast cancer Maternal Aunt    Breast cancer Maternal Grandmother        26's   Pancreatitis Neg Hx    Colon cancer Neg Hx     Social History   Socioeconomic History   Marital status: Married    Spouse name: Not on file   Number of children: 0   Years of education: Not on file   Highest education level: Not on file  Occupational History   Not on file  Tobacco Use   Smoking status: Never    Passive exposure: Past   Smokeless tobacco: Never  Vaping Use   Vaping Use: Never used  Substance and Sexual Activity   Alcohol use: No   Drug use: No   Sexual activity: Not Currently    Birth control/protection: Surgical  Other Topics Concern   Not on file  Social History Narrative   Not on file   Social Determinants of Health   Financial Resource Strain: Low Risk  (03/07/2021)   Overall Financial Resource Strain (CARDIA)    Difficulty of Paying Living Expenses: Not very hard  Food Insecurity: Not on file  Transportation Needs: Not on file  Physical Activity: Not on file  Stress: Not on file  Social Connections: Not on file  Intimate Partner Violence: Not on file      Review of Systems  Constitutional:  Negative for activity change, appetite change, chills, fatigue, fever and unexpected weight change.  HENT: Negative.  Negative for congestion, ear pain, rhinorrhea, sore throat and trouble swallowing.   Eyes: Negative.   Respiratory: Negative.   Negative for cough, chest tightness, shortness of breath and wheezing.   Cardiovascular: Negative.  Negative for chest pain.  Gastrointestinal: Negative.  Negative for abdominal pain, blood in stool, constipation, diarrhea, nausea and vomiting.  Endocrine: Negative.   Genitourinary: Negative.  Negative for difficulty urinating, dysuria, frequency, hematuria and urgency.  Musculoskeletal: Negative.  Negative for arthralgias, back pain, joint swelling, myalgias and neck pain.  Skin: Negative.  Negative for rash and wound.  Allergic/Immunologic: Negative.  Negative for immunocompromised state.  Neurological: Negative.  Negative for dizziness, seizures, numbness and headaches.  Hematological: Negative.   Psychiatric/Behavioral:  Negative for behavioral problems, self-injury and suicidal ideas. The patient is not nervous/anxious.     Vital Signs: BP 130/78   Pulse 74   Temp 98.3 F (36.8 C)   Resp 16   Ht 5\' 5"  (1.651 m)   Wt 189 lb (85.7 kg)   SpO2 99%   BMI 31.45 kg/m    Physical Exam Vitals reviewed.  Constitutional:      General: She is awake. She is not in acute distress.    Appearance: Normal appearance. She is well-developed and well-groomed. She is obese. She is not ill-appearing or diaphoretic.  HENT:     Head: Normocephalic and  atraumatic.     Right Ear: Tympanic membrane, ear canal and external ear normal.     Left Ear: Tympanic membrane, ear canal and external ear normal.     Nose: Nose normal. No congestion or rhinorrhea.     Mouth/Throat:     Mouth: Mucous membranes are moist.     Pharynx: Oropharynx is clear. No oropharyngeal exudate or posterior oropharyngeal erythema.  Eyes:     General: Lids are normal. Vision grossly intact. Gaze aligned appropriately. No scleral icterus.       Right eye: No discharge.        Left eye: No discharge.     Extraocular Movements: Extraocular movements intact.     Conjunctiva/sclera: Conjunctivae normal.     Pupils: Pupils are  equal, round, and reactive to light.     Funduscopic exam:    Right eye: Red reflex present.        Left eye: Red reflex present. Neck:     Thyroid: No thyromegaly.     Vascular: No JVD.     Trachea: Trachea and phonation normal. No tracheal deviation.  Cardiovascular:     Rate and Rhythm: Normal rate and regular rhythm.     Heart sounds: Normal heart sounds, S1 normal and S2 normal. No murmur heard.    No friction rub. No gallop.  Pulmonary:     Effort: Pulmonary effort is normal. No accessory muscle usage or respiratory distress.     Breath sounds: Normal breath sounds and air entry. No stridor. No wheezing or rales.  Chest:     Chest wall: No tenderness.  Abdominal:     General: Bowel sounds are normal. There is no distension.     Palpations: Abdomen is soft. There is no mass.     Tenderness: There is no abdominal tenderness. There is no guarding or rebound.  Musculoskeletal:        General: No tenderness or deformity. Normal range of motion.     Cervical back: Normal range of motion and neck supple.     Right lower leg: No edema.     Left lower leg: No edema.  Lymphadenopathy:     Cervical: No cervical adenopathy.  Skin:    General: Skin is warm and dry.     Capillary Refill: Capillary refill takes less than 2 seconds.     Coloration: Skin is not pale.     Findings: No erythema or rash.  Neurological:     Mental Status: She is alert and oriented to person, place, and time.     Cranial Nerves: No cranial nerve deficit.     Motor: No abnormal muscle tone.     Coordination: Coordination normal.     Gait: Gait normal.     Deep Tendon Reflexes: Reflexes are normal and symmetric.  Psychiatric:        Mood and Affect: Mood normal.        Behavior: Behavior normal. Behavior is cooperative.        Thought Content: Thought content normal.        Judgment: Judgment normal.        Assessment/Plan: 1. Encounter for routine adult health examination with abnormal  findings Age-appropriate preventive screenings and vaccinations discussed, annual physical exam completed. Routine labs for health maintenance ordered, see below. PHM updated.  - Lipid Profile - TSH + free T4 - B12 and Folate Panel - Vitamin D (25 hydroxy)  2. Acquired hypothyroidism Continue levothyroxine as prescribed, routine  labs ordered - Lipid Profile - TSH + free T4 - levothyroxine (SYNTHROID) 125 MCG tablet; Take 1 tablet (125 mcg total) by mouth daily.  Dispense: 90 tablet; Refill: 3  3. Mixed hyperlipidemia Routine labs ordered - Lipid Profile - TSH + free T4 - B12 and Folate Panel - Vitamin D (25 hydroxy)  4. B12 deficiency Routine labs ordered - B12 and Folate Panel  5. Vitamin D deficiency Routine labs ordered - Vitamin D (25 hydroxy)  6. Dysuria Routine urinalysis done - UA/M w/rflx Culture, Routine  7. Encounter for medication review Medication list reviewed, updated and refills ordered - albuterol (PROVENTIL) (2.5 MG/3ML) 0.083% nebulizer solution; Take 3 mLs (2.5 mg total) by nebulization every 6 (six) hours as needed for wheezing or shortness of breath.  Dispense: 75 mL; Refill: 6 - gabapentin (NEURONTIN) 600 MG tablet; Take 1/2 (one-half) tablet by mouth daily  Dispense: 45 tablet; Refill: 3 - ALPRAZolam (XANAX) 0.5 MG tablet; Take 1 tablet (0.5 mg total) by mouth 3 (three) times daily as needed. for anxiety  Dispense: 90 tablet; Refill: 0 - ezetimibe (ZETIA) 10 MG tablet; Take 1 tablet (10 mg total) by mouth daily.  Dispense: 90 tablet; Refill: 3 - fluticasone furoate-vilanterol (BREO ELLIPTA) 100-25 MCG/ACT AEPB; Inhale 1 puff into the lungs daily.  Dispense: 180 each; Refill: 3 - fenofibrate (TRICOR) 145 MG tablet; Take 1 tablet (145 mg total) by mouth daily.  Dispense: 90 tablet; Refill: 1 - HYDROcodone-acetaminophen (NORCO) 7.5-325 MG tablet; One tab po tid for pain prn only  Dispense: 30 tablet; Refill: 0 - montelukast (SINGULAIR) 10 MG tablet; TAKE 1  TABLET BY MOUTH ONCE DAILY FOR ASTHMA  Dispense: 90 tablet; Refill: 0    General Counseling: meridy reza understanding of the findings of todays visit and agrees with plan of treatment. I have discussed any further diagnostic evaluation that may be needed or ordered today. We also reviewed her medications today. she has been encouraged to call the office with any questions or concerns that should arise related to todays visit.    Orders Placed This Encounter  Procedures   Lipid Profile   TSH + free T4   B12 and Folate Panel   Vitamin D (25 hydroxy)    Meds ordered this encounter  Medications   albuterol (PROVENTIL) (2.5 MG/3ML) 0.083% nebulizer solution    Sig: Take 3 mLs (2.5 mg total) by nebulization every 6 (six) hours as needed for wheezing or shortness of breath.    Dispense:  75 mL    Refill:  6   gabapentin (NEURONTIN) 600 MG tablet    Sig: Take 1/2 (one-half) tablet by mouth daily    Dispense:  45 tablet    Refill:  3   ALPRAZolam (XANAX) 0.5 MG tablet    Sig: Take 1 tablet (0.5 mg total) by mouth 3 (three) times daily as needed. for anxiety    Dispense:  90 tablet    Refill:  0   ezetimibe (ZETIA) 10 MG tablet    Sig: Take 1 tablet (10 mg total) by mouth daily.    Dispense:  90 tablet    Refill:  3    For future refills   fluticasone furoate-vilanterol (BREO ELLIPTA) 100-25 MCG/ACT AEPB    Sig: Inhale 1 puff into the lungs daily.    Dispense:  180 each    Refill:  3   fenofibrate (TRICOR) 145 MG tablet    Sig: Take 1 tablet (145 mg total) by mouth daily.  Dispense:  90 tablet    Refill:  1   HYDROcodone-acetaminophen (NORCO) 7.5-325 MG tablet    Sig: One tab po tid for pain prn only    Dispense:  30 tablet    Refill:  0   levothyroxine (SYNTHROID) 125 MCG tablet    Sig: Take 1 tablet (125 mcg total) by mouth daily.    Dispense:  90 tablet    Refill:  3   montelukast (SINGULAIR) 10 MG tablet    Sig: TAKE 1 TABLET BY MOUTH ONCE DAILY FOR ASTHMA     Dispense:  90 tablet    Refill:  0    Return in about 3 months (around 07/02/2023) for F/U, anxiety med refill, Jaxx Huish PCP.   Total time spent:30 Minutes Time spent includes review of chart, medications, test results, and follow up plan with the patient.   [place] Controlled Substance Database was reviewed by me.  This patient was seen by Sallyanne Kuster, FNP-C in collaboration with Dr. Beverely Risen as a part of collaborative care agreement.  Shalimar Mcclain R. Tedd Sias, MSN, FNP-C Internal medicine

## 2023-04-09 LAB — MICROSCOPIC EXAMINATION
Bacteria, UA: NONE SEEN
Casts: NONE SEEN /lpf
Epithelial Cells (non renal): NONE SEEN /hpf (ref 0–10)
RBC, Urine: NONE SEEN /hpf (ref 0–2)

## 2023-04-09 LAB — UA/M W/RFLX CULTURE, ROUTINE
Bilirubin, UA: NEGATIVE
Glucose, UA: NEGATIVE
Ketones, UA: NEGATIVE
Leukocytes,UA: NEGATIVE
Nitrite, UA: NEGATIVE
Protein,UA: NEGATIVE
RBC, UA: NEGATIVE
Specific Gravity, UA: 1.007 (ref 1.005–1.030)
Urobilinogen, Ur: 0.2 mg/dL (ref 0.2–1.0)
pH, UA: 5.5 (ref 5.0–7.5)

## 2023-04-16 DIAGNOSIS — E782 Mixed hyperlipidemia: Secondary | ICD-10-CM | POA: Diagnosis not present

## 2023-04-16 DIAGNOSIS — Z0001 Encounter for general adult medical examination with abnormal findings: Secondary | ICD-10-CM | POA: Diagnosis not present

## 2023-04-16 DIAGNOSIS — E039 Hypothyroidism, unspecified: Secondary | ICD-10-CM | POA: Diagnosis not present

## 2023-04-16 DIAGNOSIS — E538 Deficiency of other specified B group vitamins: Secondary | ICD-10-CM | POA: Diagnosis not present

## 2023-04-16 DIAGNOSIS — E559 Vitamin D deficiency, unspecified: Secondary | ICD-10-CM | POA: Diagnosis not present

## 2023-04-17 ENCOUNTER — Encounter: Payer: Self-pay | Admitting: Psychiatry

## 2023-04-17 LAB — LIPID PANEL
Chol/HDL Ratio: 3.6 ratio (ref 0.0–4.4)
Cholesterol, Total: 182 mg/dL (ref 100–199)
HDL: 51 mg/dL (ref >39)
LDL Chol Calc (NIH): 114 mg/dL — ABNORMAL HIGH (ref 0–99)
Triglycerides: 90 mg/dL (ref 0–149)
VLDL Cholesterol Cal: 17 mg/dL (ref 5–40)

## 2023-04-17 LAB — B12 AND FOLATE PANEL
Folate: >20 ng/mL (ref >3.0)
Vitamin B-12: 604 pg/mL (ref 232–1245)

## 2023-04-17 LAB — TSH+FREE T4
Free T4: 1 ng/dL (ref 0.82–1.77)
TSH: 6.38 u[IU]/mL — ABNORMAL HIGH (ref 0.450–4.500)

## 2023-04-17 LAB — VITAMIN D 25 HYDROXY (VIT D DEFICIENCY, FRACTURES): Vit D, 25-Hydroxy: 28.9 ng/mL — ABNORMAL LOW (ref 30.0–100.0)

## 2023-04-27 NOTE — Progress Notes (Signed)
No significant change in cholesterol from last year except for decrease in triglycerides.  TSH is elevated at 6.380 -- for thyroid, make sure patient is taking levothyroxine first thing in the morning on an empty stomach and waiting 30 minutes before eating or drinking anything. If so, we will need to increase the dose to 137 mcg daily. And then recheck the TSH and free T4 in 6 weeks.  Slightly low vitamin D level at 28.9, take OTC supplement 2000-5000 units daily if desired, and spend more time out in the sun.  B12 and folate levels are normal.

## 2023-04-29 ENCOUNTER — Telehealth: Payer: Self-pay

## 2023-04-29 ENCOUNTER — Other Ambulatory Visit: Payer: Self-pay | Admitting: Nurse Practitioner

## 2023-04-29 DIAGNOSIS — E039 Hypothyroidism, unspecified: Secondary | ICD-10-CM

## 2023-04-29 MED ORDER — LEVOTHYROXINE SODIUM 137 MCG PO TABS: 137.0000 ug | ORAL_TABLET | Freq: Every day | ORAL | 2 refills | Status: DC

## 2023-04-29 NOTE — Telephone Encounter (Signed)
Left message for patient to call the office back

## 2023-04-29 NOTE — Progress Notes (Signed)
Left message for patient to give office a call back.  

## 2023-05-04 ENCOUNTER — Telehealth: Payer: Self-pay

## 2023-05-04 NOTE — Telephone Encounter (Signed)
received fax requesting a refill on the soma 350mg  take 1 &1/2 tablets by mouth once daily. pt was last seen on 6-5 next appt 12-4 .  It appears that it is a doctor  Beryle Beams that orders this medication.  Pharmacy was notified that this was not one of our providers.

## 2023-05-04 NOTE — Progress Notes (Signed)
Patient notified

## 2023-05-05 ENCOUNTER — Other Ambulatory Visit: Payer: Self-pay

## 2023-05-05 DIAGNOSIS — Z760 Encounter for issue of repeat prescription: Secondary | ICD-10-CM

## 2023-05-05 MED ORDER — FLUTICASONE PROPIONATE 50 MCG/ACT NA SUSP: 2.0000 | Freq: Every day | NASAL | 3 refills | Status: DC

## 2023-05-05 MED ORDER — VITAMIN D (ERGOCALCIFEROL) 1.25 MG (50000 UNIT) PO CAPS
50000.0000 [IU] | ORAL_CAPSULE | ORAL | 1 refills | Status: DC
Start: 2023-05-05 — End: 2023-11-09

## 2023-05-08 ENCOUNTER — Telehealth: Payer: Self-pay | Admitting: Nurse Practitioner

## 2023-05-08 DIAGNOSIS — Z79899 Other long term (current) drug therapy: Secondary | ICD-10-CM

## 2023-05-08 MED ORDER — ALPRAZOLAM 0.5 MG PO TABS: 0.5000 mg | ORAL_TABLET | Freq: Three times a day (TID) | ORAL | 2 refills | Status: DC | PRN

## 2023-05-12 DIAGNOSIS — M722 Plantar fascial fibromatosis: Secondary | ICD-10-CM | POA: Diagnosis not present

## 2023-05-12 DIAGNOSIS — M7731 Calcaneal spur, right foot: Secondary | ICD-10-CM | POA: Diagnosis not present

## 2023-05-14 NOTE — Telephone Encounter (Signed)
error 

## 2023-05-22 ENCOUNTER — Telehealth: Payer: Self-pay

## 2023-05-22 NOTE — Telephone Encounter (Signed)
Received Rx RF from pharmacy: Yuvafem 10 mcg tab

## 2023-05-24 NOTE — Telephone Encounter (Signed)
Pt was given vag estrogen crm not tabs at 12/23 annual. Is she using tabs or crm? Pt given tabs Rx 6/23 and crm Rx 12/23.

## 2023-05-25 ENCOUNTER — Other Ambulatory Visit: Payer: Self-pay | Admitting: Obstetrics and Gynecology

## 2023-05-25 MED ORDER — ESTRADIOL 10 MCG VA TABS: 1.0000 | ORAL_TABLET | VAGINAL | 1 refills | Status: DC

## 2023-05-25 NOTE — Telephone Encounter (Signed)
Called pt, no answer, LVMTRC. 

## 2023-05-25 NOTE — Telephone Encounter (Signed)
Pt aware.

## 2023-05-25 NOTE — Telephone Encounter (Signed)
Rx yuvafem RF eRxd till 12/24 annual

## 2023-05-25 NOTE — Progress Notes (Signed)
Rx RF estradiol vag tabs, not using vag crm.

## 2023-05-26 DIAGNOSIS — G4733 Obstructive sleep apnea (adult) (pediatric): Secondary | ICD-10-CM | POA: Diagnosis not present

## 2023-06-01 DIAGNOSIS — M79671 Pain in right foot: Secondary | ICD-10-CM | POA: Diagnosis not present

## 2023-06-01 DIAGNOSIS — M722 Plantar fascial fibromatosis: Secondary | ICD-10-CM | POA: Diagnosis not present

## 2023-06-01 DIAGNOSIS — M7731 Calcaneal spur, right foot: Secondary | ICD-10-CM | POA: Diagnosis not present

## 2023-06-22 DIAGNOSIS — M722 Plantar fascial fibromatosis: Secondary | ICD-10-CM | POA: Diagnosis not present

## 2023-06-22 DIAGNOSIS — M79671 Pain in right foot: Secondary | ICD-10-CM | POA: Diagnosis not present

## 2023-06-22 DIAGNOSIS — M7731 Calcaneal spur, right foot: Secondary | ICD-10-CM | POA: Diagnosis not present

## 2023-06-23 ENCOUNTER — Telehealth: Payer: Self-pay | Admitting: Nurse Practitioner

## 2023-06-23 DIAGNOSIS — E782 Mixed hyperlipidemia: Secondary | ICD-10-CM

## 2023-06-23 DIAGNOSIS — E039 Hypothyroidism, unspecified: Secondary | ICD-10-CM

## 2023-06-23 DIAGNOSIS — R42 Dizziness and giddiness: Secondary | ICD-10-CM

## 2023-06-23 DIAGNOSIS — E559 Vitamin D deficiency, unspecified: Secondary | ICD-10-CM

## 2023-06-23 DIAGNOSIS — E538 Deficiency of other specified B group vitamins: Secondary | ICD-10-CM

## 2023-06-23 NOTE — Telephone Encounter (Signed)
Lmom to pt call back

## 2023-06-26 NOTE — Addendum Note (Signed)
Addended by: Sallyanne Kuster on: 06/26/2023 08:07 AM   Modules accepted: Orders

## 2023-06-26 NOTE — Telephone Encounter (Signed)
Pt advised we placed order

## 2023-06-30 DIAGNOSIS — E039 Hypothyroidism, unspecified: Secondary | ICD-10-CM | POA: Diagnosis not present

## 2023-06-30 DIAGNOSIS — E538 Deficiency of other specified B group vitamins: Secondary | ICD-10-CM | POA: Diagnosis not present

## 2023-06-30 DIAGNOSIS — E782 Mixed hyperlipidemia: Secondary | ICD-10-CM | POA: Diagnosis not present

## 2023-06-30 DIAGNOSIS — E559 Vitamin D deficiency, unspecified: Secondary | ICD-10-CM | POA: Diagnosis not present

## 2023-06-30 DIAGNOSIS — R42 Dizziness and giddiness: Secondary | ICD-10-CM | POA: Diagnosis not present

## 2023-07-01 ENCOUNTER — Telehealth: Payer: Self-pay

## 2023-07-01 LAB — CBC WITH DIFFERENTIAL/PLATELET
Basophils Absolute: 0.1 10*3/uL (ref 0.0–0.2)
Basos: 1 %
EOS (ABSOLUTE): 0.2 10*3/uL (ref 0.0–0.4)
Eos: 4 %
Hematocrit: 39 % (ref 34.0–46.6)
Hemoglobin: 13 g/dL (ref 11.1–15.9)
Immature Grans (Abs): 0 10*3/uL (ref 0.0–0.1)
Immature Granulocytes: 0 %
Lymphocytes Absolute: 1.9 10*3/uL (ref 0.7–3.1)
Lymphs: 34 %
MCH: 29.8 pg (ref 26.6–33.0)
MCHC: 33.3 g/dL (ref 31.5–35.7)
MCV: 89 fL (ref 79–97)
Monocytes Absolute: 0.5 10*3/uL (ref 0.1–0.9)
Monocytes: 9 %
Neutrophils Absolute: 2.8 10*3/uL (ref 1.4–7.0)
Neutrophils: 52 %
Platelets: 337 10*3/uL (ref 150–450)
RBC: 4.36 x10E6/uL (ref 3.77–5.28)
RDW: 12.5 % (ref 11.7–15.4)
WBC: 5.5 10*3/uL (ref 3.4–10.8)

## 2023-07-01 LAB — VITAMIN D 25 HYDROXY (VIT D DEFICIENCY, FRACTURES): Vit D, 25-Hydroxy: 36.9 ng/mL (ref 30.0–100.0)

## 2023-07-01 LAB — CMP14+EGFR
ALT: 16 IU/L (ref 0–32)
AST: 15 IU/L (ref 0–40)
Albumin: 4.1 g/dL (ref 3.9–4.9)
Alkaline Phosphatase: 73 IU/L (ref 44–121)
BUN/Creatinine Ratio: 14 (ref 12–28)
BUN: 14 mg/dL (ref 8–27)
Bilirubin Total: 0.2 mg/dL (ref 0.0–1.2)
CO2: 23 mmol/L (ref 20–29)
Calcium: 9.7 mg/dL (ref 8.7–10.3)
Chloride: 106 mmol/L (ref 96–106)
Creatinine, Ser: 0.98 mg/dL (ref 0.57–1.00)
Globulin, Total: 2.9 g/dL (ref 1.5–4.5)
Glucose: 97 mg/dL (ref 70–99)
Potassium: 4.3 mmol/L (ref 3.5–5.2)
Sodium: 141 mmol/L (ref 134–144)
Total Protein: 7 g/dL (ref 6.0–8.5)
eGFR: 63 mL/min/{1.73_m2} (ref >59)

## 2023-07-01 LAB — IRON,TIBC AND FERRITIN PANEL
Ferritin: 120 ng/mL (ref 15–150)
Iron Saturation: 17 % (ref 15–55)
Iron: 59 ug/dL (ref 27–139)
Total Iron Binding Capacity: 349 ug/dL (ref 250–450)
UIBC: 290 ug/dL (ref 118–369)

## 2023-07-01 LAB — TSH+FREE T4
Free T4: 1.27 ng/dL (ref 0.82–1.77)
TSH: 0.802 u[IU]/mL (ref 0.450–4.500)

## 2023-07-01 LAB — LIPID PANEL
Chol/HDL Ratio: 2.9 ratio (ref 0.0–4.4)
Cholesterol, Total: 152 mg/dL (ref 100–199)
HDL: 53 mg/dL (ref >39)
LDL Chol Calc (NIH): 80 mg/dL (ref 0–99)
Triglycerides: 104 mg/dL (ref 0–149)
VLDL Cholesterol Cal: 19 mg/dL (ref 5–40)

## 2023-07-01 LAB — B12 AND FOLATE PANEL
Folate: 18.8 ng/mL (ref >3.0)
Vitamin B-12: 481 pg/mL (ref 232–1245)

## 2023-07-01 NOTE — Telephone Encounter (Signed)
Spoke with pt that we received [facility] that her gabapentin is recall lot [id number] A Exp 05/26/2024

## 2023-07-02 ENCOUNTER — Encounter: Payer: Self-pay | Admitting: Nurse Practitioner

## 2023-07-02 ENCOUNTER — Ambulatory Visit (INDEPENDENT_AMBULATORY_CARE_PROVIDER_SITE_OTHER): Payer: Medicare HMO | Admitting: Nurse Practitioner

## 2023-07-02 VITALS — BP 128/80 | HR 76 | Temp 98.5°F | Resp 16 | Ht 65.0 in | Wt 185.0 lb

## 2023-07-02 DIAGNOSIS — J301 Allergic rhinitis due to pollen: Secondary | ICD-10-CM

## 2023-07-02 DIAGNOSIS — M797 Fibromyalgia: Secondary | ICD-10-CM

## 2023-07-02 DIAGNOSIS — Z760 Encounter for issue of repeat prescription: Secondary | ICD-10-CM

## 2023-07-02 DIAGNOSIS — F411 Generalized anxiety disorder: Secondary | ICD-10-CM

## 2023-07-02 DIAGNOSIS — E039 Hypothyroidism, unspecified: Secondary | ICD-10-CM | POA: Diagnosis not present

## 2023-07-02 DIAGNOSIS — E782 Mixed hyperlipidemia: Secondary | ICD-10-CM

## 2023-07-02 DIAGNOSIS — Z79899 Other long term (current) drug therapy: Secondary | ICD-10-CM

## 2023-07-02 MED ORDER — FENOFIBRATE 145 MG PO TABS
145.0000 mg | ORAL_TABLET | Freq: Every day | ORAL | 1 refills | Status: DC
Start: 2023-07-02 — End: 2023-09-21

## 2023-07-02 MED ORDER — ALPRAZOLAM 0.5 MG PO TABS
0.5000 mg | ORAL_TABLET | Freq: Three times a day (TID) | ORAL | 1 refills | Status: DC | PRN
Start: 2023-07-02 — End: 2023-09-21

## 2023-07-02 MED ORDER — LEVOTHYROXINE SODIUM 137 MCG PO TABS: 137.0000 ug | ORAL_TABLET | Freq: Every day | ORAL | 3 refills | Status: DC

## 2023-07-02 MED ORDER — CARISOPRODOL 350 MG PO TABS
175.0000 mg | ORAL_TABLET | Freq: Every day | ORAL | 2 refills | Status: AC
Start: 2023-07-02 — End: ?

## 2023-07-02 MED ORDER — AZELASTINE HCL 0.1 % NA SOLN: 2.0000 | Freq: Two times a day (BID) | NASAL | 4 refills | Status: DC

## 2023-07-02 MED ORDER — HYDROCODONE-ACETAMINOPHEN 7.5-325 MG PO TABS
ORAL_TABLET | ORAL | 0 refills | Status: DC
Start: 2023-07-02 — End: 2024-03-03

## 2023-07-02 MED ORDER — MONTELUKAST SODIUM 10 MG PO TABS
ORAL_TABLET | ORAL | 1 refills | Status: DC
Start: 2023-07-02 — End: 2023-12-28

## 2023-07-02 NOTE — Progress Notes (Signed)
[facility] [address] [place], [place] [address]  Internal MEDICINE  Office Visit Note  Patient Name: Susan [name]  [id number]  [id number]  Date of Service: 07/02/2023  Chief Complaint  Patient presents with   Depression   Gastroesophageal Reflux   Hyperlipidemia   Follow-up    HPI Susan [name] presents for a follow-up visit for anxiety refills, lab results, allergic rhinitis, hypothyroidism Anxiety -- doing well with current medications, due for refills.  Lab results -- reviewed all labs with patient, all results were normal.  Allergic rhinitis -- due to pollen, takes singulair and uses azelastine nasal spray.  Hypothyroidism -- stable, due for refills.  Cholesterol is controlled, patient takes fenofibrate   Current Medication: Outpatient Encounter Medications as of 07/02/2023  Medication Sig   acetaminophen (TYLENOL) 500 MG tablet Take by mouth as needed.   albuterol (PROVENTIL) (2.5 MG/3ML) 0.083% nebulizer solution Take 3 mLs (2.5 mg total) by nebulization every 6 (six) hours as needed for wheezing or shortness of breath.   albuterol (VENTOLIN HFA) 108 (90 Base) MCG/ACT inhaler Inhale 2 puffs into the lungs every 6 (six) hours as needed for wheezing or shortness of breath.   amitriptyline (ELAVIL) 25 MG tablet Take 1 tablet (25 mg total) by mouth at bedtime.   Ascorbic Acid (VITAMIN C) 100 MG tablet Take 100 mg by mouth daily.    cyanocobalamin (VITAMIN B12) 1000 MCG/ML injection INJECT INTRAMUSCULARLY EACH MONTH   Dentifrices (BIOTENE DRY MOUTH DT) by Transmucosal route.   eletriptan (RELPAX) 40 MG tablet Take 40 mg by mouth as needed for migraine. may repeat in 2 hours if necessary    estradiol (CLIMARA) 0.05 mg/24hr patch APPLY 1 PATCH TO SKIN ONCE WEEKLY   Estradiol (YUVAFEM) 10 MCG TABS vaginal tablet Place 1 tablet (10 mcg total) vaginally 2 (two) times a week.   ezetimibe (ZETIA) 10 MG tablet Take 1 tablet (10 mg total) by mouth daily.   fluticasone  (FLONASE) 50 MCG/ACT nasal spray Place 2 sprays into both nostrils daily.   fluticasone furoate-vilanterol (BREO ELLIPTA) 100-25 MCG/ACT AEPB Inhale 1 puff into the lungs daily.   folic acid (FOLVITE) 1 MG tablet Take 1 tablet (1 mg total) by mouth daily.   gabapentin (NEURONTIN) 600 MG tablet Take 1/2 (one-half) tablet by mouth daily   hydroxychloroquine (PLAQUENIL) 200 MG tablet Take 1 tablet (200 mg total) by mouth 2 (two) times daily.   hyoscyamine (ANASPAZ) 0.125 MG TBDP disintergrating tablet Place 0.125 mg under the tongue every 4 (four) hours as needed for bladder spasms.    Lactobacillus (DIGESTIVE HEALTH PROBIOTIC PO) Take 1 tablet by mouth daily.    LINZESS 290 MCG CAPS capsule Take 1 capsule (290 mcg total) by mouth daily.   Magnesium Oxide (NATRUL MAGNESIUM PO) Take by mouth.    Multiple Vitamins-Minerals (ZINC PO) Take by mouth daily.   mupirocin ointment (BACTROBAN) 2 % as needed.   pantoprazole (PROTONIX) 40 MG tablet Take 40 mg by mouth daily.    Polyethyl Glycol-Propyl Glycol 0.4-0.3 % SOLN Apply to eye.    Probiotic Product (PHILLIPS COLON HEALTH) CAPS Take by mouth.    sodium chloride (OCEAN) 0.65 % SOLN nasal spray Place 1 spray into both nostrils as needed for congestion.    SUMAtriptan-naproxen (TREXIMET) 85-500 MG tablet daily as needed.    SYRINGE-NEEDLE, DISP, 3 ML (B-D 3CC LUER-LOK SYR 25GX5/8") 25G X 5/8" 3 ML MISC USE AS DIRECTED ONCE  A  MONTH   venlafaxine XR (  EFFEXOR-XR) 150 MG 24 hr capsule Take 2 capsules (300 mg total) by mouth at bedtime.   Vitamin D, Ergocalciferol, (DRISDOL) 1.25 MG (50000 UNIT) CAPS capsule Take 1 capsule (50,000 Units total) by mouth every 7 (seven) days. Monday   vitamin E 1000 UNIT capsule Take by mouth.    [DISCONTINUED] ALPRAZolam (XANAX) 0.5 MG tablet Take 1 tablet (0.5 mg total) by mouth 3 (three) times daily as needed. for anxiety   [DISCONTINUED] azelastine (ASTELIN) 0.1 % nasal spray Place 2 sprays into both nostrils 2 (two) times  daily. Use in each nostril as directed   [DISCONTINUED] carisoprodol (SOMA) 350 MG tablet Take 350 mg by mouth at bedtime. Patient states that she takes 1/2 tablet nightly    [DISCONTINUED] fenofibrate (TRICOR) 145 MG tablet Take 1 tablet (145 mg total) by mouth daily.   [DISCONTINUED] HYDROcodone-acetaminophen (NORCO) 7.5-325 MG tablet One tab po tid for pain prn only   [DISCONTINUED] levothyroxine (SYNTHROID) 137 MCG tablet Take 1 tablet (137 mcg total) by mouth daily before breakfast.   [DISCONTINUED] montelukast (SINGULAIR) 10 MG tablet TAKE 1 TABLET BY MOUTH ONCE DAILY FOR ASTHMA   ALPRAZolam (XANAX) 0.5 MG tablet Take 1 tablet (0.5 mg total) by mouth 3 (three) times daily as needed for anxiety. for anxiety   azelastine (ASTELIN) 0.1 % nasal spray Place 2 sprays into both nostrils 2 (two) times daily. Use in each nostril as directed   carisoprodol (SOMA) 350 MG tablet Take 0.5 tablets (175 mg total) by mouth at bedtime.   fenofibrate (TRICOR) 145 MG tablet Take 1 tablet (145 mg total) by mouth daily.   HYDROcodone-acetaminophen (NORCO) 7.5-325 MG tablet One tab po tid for pain prn only   levothyroxine (SYNTHROID) 137 MCG tablet Take 1 tablet (137 mcg total) by mouth daily before breakfast.   montelukast (SINGULAIR) 10 MG tablet TAKE 1 TABLET BY MOUTH ONCE DAILY FOR ASTHMA   No facility-administered encounter medications on file as of 07/02/2023.    Surgical History: Past Surgical History:  Procedure Laterality Date   ABDOMINAL HYSTERECTOMY  2006   BLADDER REPAIR     BREAST CYST ASPIRATION Left yrs ago   CHOLECYSTECTOMY  1989   COLONOSCOPY N/A 10/16/2021   Procedure: COLONOSCOPY;  Surgeon: Earline Mayotte, MD;  Location: [facility] ENDOSCOPY;  Service: Endoscopy;  Laterality: N/A;   COLONOSCOPY WITH ESOPHAGOGASTRODUODENOSCOPY (EGD)     COLONOSCOPY WITH PROPOFOL N/A 08/03/2017   Procedure: COLONOSCOPY WITH PROPOFOL;  Surgeon: Christena Deem, MD;  Location: [facility] ENDOSCOPY;  Service:  Endoscopy;  Laterality: N/A;   COLONOSCOPY WITH PROPOFOL N/A 10/18/2018   Procedure: COLONOSCOPY WITH PROPOFOL;  Surgeon: Christena Deem, MD;  Location: [facility] ENDOSCOPY;  Service: Endoscopy;  Laterality: N/A;   ESOPHAGOGASTRODUODENOSCOPY N/A 10/07/2013   Procedure: ESOPHAGOGASTRODUODENOSCOPY (EGD);  Surgeon: West Bali, MD;  Location: [facility] ENDO SUITE;  Service: Endoscopy;  Laterality: N/A;  patient received heparin at 530am given phenergan 25mg  IV 30 minutes before   ESOPHAGOGASTRODUODENOSCOPY     x4   EYE SURGERY     lacrimal gland    FLEXIBLE BRONCHOSCOPY N/A 06/04/2016   Procedure: FLEXIBLE BRONCHOSCOPY;  Surgeon: Yevonne Pax, MD;  Location: [facility] ORS;  Service: Pulmonary;  Laterality: N/A;   INCONTINENCE SURGERY  2004   NASAL SINUS SURGERY     THYROID LOBECTOMY     TOTAL THYROIDECTOMY      Medical History: Past Medical History:  Diagnosis Date   Arthritis    Asthma    Asthmatic bronchitis  BRCA negative 05/2014   MyRisk neg   Celiac disease    Celiac sprue    Chronic anxiety    Complication of anesthesia    vomiting   COPD (chronic obstructive pulmonary disease) (HCC)    Depression    Environmental allergies    Family history of breast cancer 05/2014   MyRisk neg; IBIS=29.5%   Fibromyalgia    neuropathy all over   Gastritis 10/08/2013   GERD (gastroesophageal reflux disease)    Hyperlipemia    Hyperlipidemia    Intolerant to statins   Hypothyroidism (acquired)    Increased risk of breast cancer 05/2014   IBIS=29.5%   Increased risk of breast cancer 05/2014   IBIS=29.5%   Migraine    Migraines    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     Family History: Family History  Problem Relation Age of Onset   Arthritis/Rheumatoid Mother        died in age 79s   Breast cancer Mother 54   Breast cancer Maternal Aunt         X 2. 50's   Breast cancer Maternal Aunt    Lung cancer Maternal Aunt    Breast cancer Maternal Aunt    Breast cancer Maternal Grandmother        81's   Pancreatitis Neg Hx    Colon cancer Neg Hx     Social History   Socioeconomic History   Marital status: Married    Spouse name: Not on file   Number of children: 0   Years of education: Not on file   Highest education level: Not on file  Occupational History   Not on file  Tobacco Use   Smoking status: Never    Passive exposure: Past   Smokeless tobacco: Never  Vaping Use   Vaping status: Never Used  Substance and Sexual Activity   Alcohol use: No   Drug use: No   Sexual activity: Not Currently    Birth control/protection: Surgical  Other Topics Concern   Not on file  Social History Narrative   Not on file   Social Determinants of Health   Financial Resource Strain: Low Risk  (03/07/2021)   Overall Financial Resource Strain (CARDIA)    Difficulty of Paying Living Expenses: Not very hard  Food Insecurity: Not on file  Transportation Needs: Not on file  Physical Activity: Not on file  Stress: Not on file  Social Connections: Not on file  Intimate Partner Violence: Not At Risk (06/12/2022)   [facility]    Threatened: Not on file    Insulted: Not on file    Physically Hurt : Not on file    Scream: Not on file      Review of Systems  Constitutional:  Negative for chills, fatigue and unexpected weight change.  HENT:  Negative for congestion, rhinorrhea, sneezing and sore throat.   Eyes:  Negative for redness.  Respiratory: Negative.  Negative for cough, chest tightness, shortness of breath and wheezing.   Cardiovascular: Negative.  Negative for chest pain and palpitations.  Gastrointestinal: Negative.  Negative for abdominal pain, constipation, diarrhea, nausea and vomiting.  Genitourinary:  Negative for dysuria and frequency.  Musculoskeletal:  Negative for  arthralgias, back pain, joint swelling and neck pain.  Skin:  Negative for rash.  Neurological:  Negative for dizziness, tremors, light-headedness and numbness.  Hematological:  Negative for adenopathy. Does not bruise/bleed easily.  Psychiatric/Behavioral:  Negative for behavioral problems (Depression), self-injury, sleep disturbance and suicidal ideas. The patient is nervous/anxious.     Vital Signs: BP 128/80   Pulse 76   Temp 98.5 F (36.9 C)   Resp 16   Ht 5\' 5"  (1.651 m)   Wt 185 lb (83.9 kg)   SpO2 98%   BMI 30.79 kg/m    Physical Exam Vitals reviewed.  Constitutional:      Appearance: Normal appearance. She is obese.  HENT:     Head: Normocephalic and atraumatic.  Eyes:     Pupils: Pupils are equal, round, and reactive to light.  Cardiovascular:     Rate and Rhythm: Normal rate and regular rhythm.  Pulmonary:     Effort: Pulmonary effort is normal. No respiratory distress.  Neurological:     Mental Status: She is alert and oriented to person, place, and time.  Psychiatric:        Mood and Affect: Mood normal.        Behavior: Behavior normal.        Assessment/Plan: 1. Fibromyalgia Continue prn hydrocodone and prn carisoprodol as prescribed.  - carisoprodol (SOMA) 350 MG tablet; Take 0.5 tablets (175 mg total) by mouth at bedtime.  Dispense: 45 tablet; Refill: 2 - HYDROcodone-acetaminophen (NORCO) 7.5-325 MG tablet; One tab po tid for pain prn only  Dispense: 30 tablet; Refill: 0  2. Acquired hypothyroidism Continue levothyroxine as prescribed.  - levothyroxine (SYNTHROID) 137 MCG tablet; Take 1 tablet (137 mcg total) by mouth daily before breakfast.  Dispense: 90 tablet; Refill: 3  3. Mixed hyperlipidemia Continue fenofibrate as prescribed  - fenofibrate (TRICOR) 145 MG tablet; Take 1 tablet (145 mg total) by mouth daily.  Dispense: 90 tablet; Refill: 1  4. Seasonal allergic rhinitis due to pollen Continue montelukast and azelastine nasal spray as  prescribed.  - azelastine (ASTELIN) 0.1 % nasal spray; Place 2 sprays into both nostrils 2 (two) times daily. Use in each nostril as directed  Dispense: 30 mL; Refill: 4 - montelukast (SINGULAIR) 10 MG tablet; TAKE 1 TABLET BY MOUTH ONCE DAILY FOR ASTHMA  Dispense: 90 tablet; Refill: 1  5. Generalized anxiety disorder Continue prn alprazolam as prescribed  - ALPRAZolam (XANAX) 0.5 MG tablet; Take 1 tablet (0.5 mg total) by mouth 3 (three) times daily as needed for anxiety. for anxiety  Dispense: 90 tablet; Refill: 1   General Counseling: diannia tovar understanding of the findings of todays visit and agrees with plan of treatment. I have discussed any further diagnostic evaluation that may be needed or ordered today. We also reviewed her medications today. she has been encouraged to call the office with any questions or concerns that should arise related to todays visit.    No orders of the defined types were placed in this encounter.   Meds ordered this encounter  Medications   carisoprodol (SOMA) 350 MG tablet    Sig: Take 0.5 tablets (175 mg total) by mouth at bedtime.    Dispense:  45 tablet    Refill:  2    Fill new script today.   azelastine (ASTELIN) 0.1 % nasal spray    Sig: Place 2 sprays into both nostrils 2 (two) times daily. Use in each nostril as directed    Dispense:  30 mL    Refill:  4   fenofibrate (  TRICOR) 145 MG tablet    Sig: Take 1 tablet (145 mg total) by mouth daily.    Dispense:  90 tablet    Refill:  1   levothyroxine (SYNTHROID) 137 MCG tablet    Sig: Take 1 tablet (137 mcg total) by mouth daily before breakfast.    Dispense:  90 tablet    Refill:  3    Discontinue the 125 mcg dose. Fill new script now.   ALPRAZolam (XANAX) 0.5 MG tablet    Sig: Take 1 tablet (0.5 mg total) by mouth 3 (three) times daily as needed for anxiety. for anxiety    Dispense:  90 tablet    Refill:  1    Add 2 more fills on file.   montelukast (SINGULAIR) 10 MG tablet     Sig: TAKE 1 TABLET BY MOUTH ONCE DAILY FOR ASTHMA    Dispense:  90 tablet    Refill:  1   HYDROcodone-acetaminophen (NORCO) 7.5-325 MG tablet    Sig: One tab po tid for pain prn only    Dispense:  30 tablet    Refill:  0    Return in about 3 months (around 09/21/2023) for F/U, anxiety med refill, Mariesha Venturella PCP, needs written scripts for soma and xanax .   Total time spent:30 Minutes Time spent includes review of chart, medications, test results, and follow up plan with the patient.   [place] Controlled Substance Database was reviewed by me.  This patient was seen by Sallyanne Kuster, FNP-C in collaboration with Dr. Beverely Risen as a part of collaborative care agreement.   Nyeemah Jennette R. Tedd Sias, MSN, FNP-C Internal medicine

## 2023-07-03 DIAGNOSIS — G43909 Migraine, unspecified, not intractable, without status migrainosus: Secondary | ICD-10-CM | POA: Diagnosis not present

## 2023-07-03 DIAGNOSIS — K219 Gastro-esophageal reflux disease without esophagitis: Secondary | ICD-10-CM | POA: Diagnosis not present

## 2023-07-03 DIAGNOSIS — G4733 Obstructive sleep apnea (adult) (pediatric): Secondary | ICD-10-CM | POA: Diagnosis not present

## 2023-07-03 DIAGNOSIS — K589 Irritable bowel syndrome without diarrhea: Secondary | ICD-10-CM | POA: Diagnosis not present

## 2023-07-03 DIAGNOSIS — E785 Hyperlipidemia, unspecified: Secondary | ICD-10-CM | POA: Diagnosis not present

## 2023-07-03 DIAGNOSIS — E669 Obesity, unspecified: Secondary | ICD-10-CM | POA: Diagnosis not present

## 2023-07-03 DIAGNOSIS — F411 Generalized anxiety disorder: Secondary | ICD-10-CM | POA: Diagnosis not present

## 2023-07-03 DIAGNOSIS — M1990 Unspecified osteoarthritis, unspecified site: Secondary | ICD-10-CM | POA: Diagnosis not present

## 2023-07-03 DIAGNOSIS — Z79899 Other long term (current) drug therapy: Secondary | ICD-10-CM | POA: Diagnosis not present

## 2023-07-03 DIAGNOSIS — E890 Postprocedural hypothyroidism: Secondary | ICD-10-CM | POA: Diagnosis not present

## 2023-07-03 DIAGNOSIS — J301 Allergic rhinitis due to pollen: Secondary | ICD-10-CM | POA: Diagnosis not present

## 2023-07-03 DIAGNOSIS — F329 Major depressive disorder, single episode, unspecified: Secondary | ICD-10-CM | POA: Diagnosis not present

## 2023-07-20 DIAGNOSIS — M79671 Pain in right foot: Secondary | ICD-10-CM | POA: Diagnosis not present

## 2023-07-20 DIAGNOSIS — M722 Plantar fascial fibromatosis: Secondary | ICD-10-CM | POA: Diagnosis not present

## 2023-07-20 DIAGNOSIS — M7731 Calcaneal spur, right foot: Secondary | ICD-10-CM | POA: Diagnosis not present

## 2023-08-03 ENCOUNTER — Other Ambulatory Visit: Payer: Self-pay | Admitting: Obstetrics & Gynecology

## 2023-08-03 DIAGNOSIS — Z1231 Encounter for screening mammogram for malignant neoplasm of breast: Secondary | ICD-10-CM

## 2023-08-09 ENCOUNTER — Encounter: Payer: Self-pay | Admitting: Nurse Practitioner

## 2023-08-09 DIAGNOSIS — E782 Mixed hyperlipidemia: Secondary | ICD-10-CM | POA: Insufficient documentation

## 2023-08-20 ENCOUNTER — Ambulatory Visit: Payer: Medicare HMO | Admitting: Nurse Practitioner

## 2023-08-20 ENCOUNTER — Other Ambulatory Visit: Payer: Self-pay | Admitting: Psychiatry

## 2023-08-20 DIAGNOSIS — F411 Generalized anxiety disorder: Secondary | ICD-10-CM

## 2023-08-20 DIAGNOSIS — F3342 Major depressive disorder, recurrent, in full remission: Secondary | ICD-10-CM

## 2023-08-27 NOTE — Progress Notes (Signed)
Office Visit Note  Patient: Susan [name]             Date of Birth: 1956-04-01           MRN: [id number]             PCP: Sallyanne Kuster, NP Referring: Sallyanne Kuster, NP Visit Date: 09/09/2023 Occupation: @GUAROCC @  Subjective:  Medication management  History of Present Illness: Susan [name] is a 67 y.o. female Sjogren's, osteoarthritis and fibromyalgia syndrome.  She states she continues to have dry mouth and dry eyes.  She states her lips are almost chapped.  She uses Vaseline at night and also uses CPAP machine.  She states that humidity in the CPAP machine helps to some extent.  She has been going to the dentist on a regular basis.  She has an eye examination coming up next week.  She continues to have dry eyes.  She complains of discomfort in the bilateral hands and her bilateral feet.  She has an eye examination coming up next week.  She continues to have dry eyes.  She complains of discomfort in the bilateral hands and her bilateral feet.  She has been taking hydroxychloroquine 200 mg p.o. twice daily without any interruption.  She denies any shortness of breath she continues to have some shortness of breath.  She denies any palpitations.  She states she had injections in her right foot by the podiatrist which were helpful.    Activities of Daily Living:  Patient reports morning stiffness for several hours.   Patient Reports nocturnal pain.  Difficulty dressing/grooming: Reports Difficulty climbing stairs: Reports Difficulty getting out of chair: Reports Difficulty using hands for taps, buttons, cutlery, and/or writing: Reports  Review of Systems  Constitutional:  Positive for fatigue.  HENT:  Positive for mouth sores and mouth dryness.   Eyes:  Positive for dryness.  Respiratory:  Positive for shortness of breath.   Cardiovascular:  Negative for chest pain and palpitations.  Gastrointestinal:  Positive for constipation and diarrhea. Negative for blood in stool.   Endocrine: Positive for increased urination.  Genitourinary:  Positive for involuntary urination. Negative for painful urination.  Musculoskeletal:  Positive for joint pain, gait problem, joint pain, joint swelling, myalgias, muscle weakness, morning stiffness, muscle tenderness and myalgias.  Skin:  Positive for hair loss and sensitivity to sunlight. Negative for color change and rash.  Allergic/Immunologic: Negative for susceptible to infections.  Neurological:  Negative for dizziness, numbness and headaches.  Hematological:  Positive for swollen glands.  Psychiatric/Behavioral:  Positive for sleep disturbance. Negative for depressed mood. The patient is not nervous/anxious.     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   Essential tremor 06/20/2020   OSA (obstructive sleep apnea) 03/26/2020   Chronic wrist pain, right 11/02/2019   Chronic pain of left wrist 11/02/2019   Pain in joints of right hand 11/02/2019   Pain in joint of left hand 11/02/2019   Primary osteoarthritis of both hands 11/02/2019   MDD (major depressive disorder), recurrent, 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/05/2019   Dysuria 04/03/2019   Generalized anxiety disorder 02/28/2019   Chronic pain of both knees 03/01/2018  Encounter for general adult medical examination with abnormal findings 03/01/2018   Primary osteoarthritis of both first carpometacarpal joints 03/01/2018   Chronic obstructive pulmonary disease, unspecified (HCC) 12/04/2017   Shortness of breath 12/04/2017   Allergic rhinitis, unspecified 12/04/2017   Low grade squamous intraepith lesion on cytologic  smear vagina (lgsil) 09/09/2017   Vaginal atrophy 09/01/2017   Chronic right-sided low back pain without sciatica 01/05/2017   Primary Sjogren's syndrome (HCC) 01/05/2017   CD (celiac disease) 04/17/2015   Gastroesophageal reflux disease with esophagitis 04/17/2015   Hx of Sjogren's disease (HCC) 04/17/2015   Acute asthma exacerbation 10/14/2013   Acute respiratory failure (HCC) 10/14/2013   Gastritis 10/08/2013   Bradycardia 10/07/2013   Esophageal spasm 10/06/2013   Substernal precordial chest pain 10/06/2013   Chronic anxiety 10/06/2013   GERD (gastroesophageal reflux disease) 10/06/2013   Chronic asthma 10/06/2013   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   Depression with anxiety 10/06/2013   Fibromyalgia 10/06/2013   Hypothyroidism 10/06/2013   Chronic migraine 10/06/2013    Past Medical History:  Diagnosis Date   Arthritis    Asthma    Asthmatic bronchitis    BRCA negative 05/2014   MyRisk neg   Celiac disease    Celiac sprue    Chronic anxiety    Complication of anesthesia    vomiting   COPD (chronic obstructive pulmonary disease) (HCC)    Depression    Environmental allergies    Family history of breast cancer 05/2014   MyRisk neg; IBIS=29.5%   Fibromyalgia    neuropathy all over   Gastritis 10/08/2013   GERD (gastroesophageal reflux disease)    Hyperlipemia    Hyperlipidemia    Intolerant to statins   Hypothyroidism (acquired)    Increased risk of breast cancer 05/2014   IBIS=29.5%   Increased risk of breast cancer 05/2014   IBIS=29.5%   Migraine    Migraines    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     Family History  Problem Relation Age of Onset   Arthritis/Rheumatoid Mother        died in age 78s   Breast cancer Mother 65   Breast cancer Maternal Aunt        X 2. 50's   Breast cancer Maternal Aunt    Lung  cancer Maternal Aunt    Breast cancer Maternal Aunt    Breast cancer Maternal Grandmother        65's   Pancreatitis Neg Hx    Colon cancer Neg Hx    Past Surgical History:  Procedure Laterality Date   ABDOMINAL HYSTERECTOMY  2006   BLADDER REPAIR     BREAST CYST ASPIRATION Left yrs ago   CHOLECYSTECTOMY  1989   COLONOSCOPY N/A 10/16/2021   Procedure: COLONOSCOPY;  Surgeon: Earline Mayotte, MD;  Location: [facility] ENDOSCOPY;  Service: Endoscopy;  Laterality: N/A;   COLONOSCOPY WITH ESOPHAGOGASTRODUODENOSCOPY (EGD)     COLONOSCOPY WITH PROPOFOL N/A 08/03/2017   Procedure: COLONOSCOPY WITH PROPOFOL;  Surgeon: Christena Deem, MD;  Location[facility] ENDOSCOPY;  Service: Endoscopy;  Laterality: N/A;   COLONOSCOPY WITH PROPOFOL N/A 10/18/2018   Procedure: COLONOSCOPY WITH PROPOFOL;  Surgeon: Christena Deem, MD;  Location: [facility] ENDOSCOPY;  Service: Endoscopy;  Laterality: N/A;   ESOPHAGOGASTRODUODENOSCOPY N/A 10/07/2013   Procedure: ESOPHAGOGASTRODUODENOSCOPY (EGD);  Surgeon: West Bali,  MD;  Location: [facility] ENDO SU[facility];  Service: Endoscopy;  Laterality: N/A;  patient received heparin at 530am given phenergan 25mg  IV 30 minutes before   ESOPHAGOGASTRODUODENOSCOPY     x4   EYE SURGERY     lacrimal gland    FLEXIBLE BRONCHOSCOPY N/A 06/04/2016   Procedure: FLEXIBLE BRONCHOSCOPY;  Surgeon: Yevonne Pax, MD;  Location: [facility] ORS;  Service: Pulmonary;  Laterality: N/A;   INCONTINENCE SURGERY  2004   NASAL SINUS SURGERY     THYROID LOBECTOMY     TOTAL THYROIDECTOMY     Social History   Social History Narrative   Not on file   Immunization History  Administered Date(s) Administered   Influenza Inj Mdck Quad Pf 07/22/2018, 07/05/2019, 06/27/2020, 07/19/2021   Influenza-Unspecified 08/17/2017   Moderna Sars-Covid-2 Vaccination 12/03/2019, 12/23/2019, 01/24/2020, 08/13/2020, 05/15/2021   Pneumococcal Conjugate-13 10/18/2020   Tdap 07/27/2019   Zoster Recombinant(Shingrix) 08/21/2017,  12/08/2017     Objective: Vital Signs: BP 122/77 (BP Location: Left Arm, Patient Position: Sitting, Cuff Size: Normal)   Pulse (!) 106   Resp 15   Ht 5\' 5"  (1.651 m)   Wt 185 lb 12.8 oz (84.3 kg)   BMI 30.92 kg/m    Physical Exam Vitals and nursing note reviewed.  Constitutional:      Appearance: She is well-developed.  HENT:     Head: Normocephalic and atraumatic.  Eyes:     Conjunctiva/sclera: Conjunctivae normal.  Cardiovascular:     Rate and Rhythm: Normal rate and regular rhythm.     Heart sounds: Normal heart sounds.  Pulmonary:     Effort: Pulmonary effort is normal.     Breath sounds: Normal breath sounds.  Abdominal:     General: Bowel sounds are normal.     Palpations: Abdomen is soft.  Musculoskeletal:     Cervical back: Normal range of motion.  Lymphadenopathy:     Cervical: No cervical adenopathy.  Skin:    General: Skin is warm and dry.     Capillary Refill: Capillary refill takes less than 2 seconds.  Neurological:     Mental Status: She is alert and oriented to person, place, and time.  Psychiatric:        Behavior: Behavior normal.      Musculoskeletal Exam: She had good range of motion of the cervical, thoracic and lumbar spine.  There was no SI joint tenderness.  Shoulders, elbow joints, wrist joints were in good range of motion.  She had bilateral PIP and DIP thickening and CMC tenderness.  No synovitis was noted.  Hip joints and knee joints were in good range of motion.  There was no tenderness over ankles or MTPs.  No synovitis was noted over MTPs.  She had PIP and DIP thickening.  CDAI Exam: CDAI Score: -- Patient Global: --; Provider Global: -- Swollen: --; Tender: -- Joint Exam 09/09/2023   No joint exam has been documented for this visit   There is currently no information documented on the homunculus. Go to the Rheumatology activity and complete the homunculus joint exam.  Investigation: No additional findings.  Imaging: No results  found.  Recent Labs: Lab Results  Component Value Date   WBC 5.5 06/30/2023   HGB 13.0 06/30/2023   PLT 337 06/30/2023   NA 141 06/30/2023   K 4.3 06/30/2023   CL 106 06/30/2023   CO2 23 06/30/2023   GLUCOSE 97 06/30/2023   BUN 14 06/30/2023   CREATININE 0.98 06/30/2023  BILITOT 0.2 06/30/2023   ALKPHOS 73 06/30/2023   AST 15 06/30/2023   ALT 16 06/30/2023   PROT 7.0 06/30/2023   ALBUMIN 4.1 06/30/2023   CALCIUM 9.7 06/30/2023   GFRAA 80 03/06/2021    Speciality Comments: PLQ Eye Exam: 10/02/2022 WNL @ [facility] Follow up in 1 year  Procedures:  No procedures performed Allergies: Aspirin, Keflex [cephalexin], Azithromycin, Barley grass, Clarithromycin, Metoclopramide, Oat, Oatmeal, Other, Rye grass flower pollen extract [gramineae pollens], Vioxx [rofecoxib], Wheat, Wheat extract, Celebrex [celecoxib], and Metoclopramide hcl   Assessment / Plan:     Visit Diagnoses: Sjogren's syndrome with other organ involvement (HCC) - Diagnosed at [facility] 12 years ago and was placed on Plaquenil.  Hx of dry mouth, dental decay and dry eyes.  ANA positive, Ro negative, La negative, RF negative: -She continues to have dry mouth and dry eye symptoms.  She gives history of chapped lips.  Use of coconut oil and Aquaphor was discussed.  She has been using over-the-counter products.  No synovitis was noted on the examination.  I discussed the option of pilocarpine but patient is hesitant due to side effects.  She has been taking hydroxychloroquine on a regular basis.  Prescription refill for hydroxychloroquine was given.  Autoimmune labs on March 30, 2023 were negative.  ANA remains positive.  Plan: hydroxychloroquine (PLAQUENIL) 200 MG tablet  High risk medication use - Plaquenil 200 mg 1 tablet by mouth twice daily.  She restarted plaquenil in May 2022. PLQ Eye Exam: 10/02/2022.  Labs obtained on June 30, 2023 CBC and CMP were normal.  Vitamin D was normal at 36.9.  Patient is going to  [place] and will be back in April.  She plans to get labs when she returns.  Information for immunization was placed in the AVS.  Chronic right shoulder pain -she good range of motion of her right shoulder joint today.  X-ray of the shoulder joint was unremarkable.  Primary osteoarthritis of both hands-she complains of pain and stiffness in the bilateral hands.  Bilateral PIP and DIP thickening with no synovitis was noted.  Joint protection muscle strengthening was discussed.  Primary osteoarthritis of both knees-she complains of stiffness in her knee joints.  No warmth swelling or effusion was noted.  Range of motion exercises were emphasized.  Primary osteoarthritis of both feet-patient states she had cortisone injection in her right foot by the podiatrist and the pain has improved.  Although she continues to have some chronic discomfort in her feet.  Fibromyalgia-she has generalized discomfort from fibromyalgia.  Need for regular exercise and stretching was discussed.  Chronic left-sided low back pain without sciatica-she has chronic intermittent discomfort.  Core strengthening exercises were discussed.  Chronic pain syndrome-she remains on Tylenol as needed and hydrocodone.  Other medical problems are listed as follows:  CD (celiac disease)  Esophageal spasm  Nutcracker esophagus  History of gastroesophageal reflux (GERD)  MDD (major depressive disorder), recurrent, in partial remission (HCC)  History of thyroid cancer  Generalized anxiety disorder  Chronic obstructive pulmonary disease, unspecified COPD type (HCC)  History of asthma  Vertigo  Family history of rheumatoid arthritis  Orders: No orders of the defined types were placed in this encounter.  Meds ordered this encounter  Medications   hydroxychloroquine (PLAQUENIL) 200 MG tablet    Sig: Take 1 tablet (200 mg total) by mouth 2 (two) times daily.    Dispense:  180 tablet    Refill:  0     Follow-Up  Instructions: Return in about 5 months (around 02/07/2024) for Sjogren's, Osteoarthritis.   Pollyann Savoy, MD  Note - This record has been created using Animal nutritionist.  Chart creation errors have been sought, but may not always  have been located. Such creation errors do not reflect on  the standard of medical care.

## 2023-09-08 ENCOUNTER — Ambulatory Visit: Payer: Medicare HMO | Admitting: Internal Medicine

## 2023-09-08 ENCOUNTER — Encounter: Payer: Self-pay | Admitting: Internal Medicine

## 2023-09-08 VITALS — BP 122/70 | HR 80 | Temp 98.3°F | Resp 16 | Ht 65.0 in | Wt 183.6 lb

## 2023-09-08 DIAGNOSIS — M797 Fibromyalgia: Secondary | ICD-10-CM | POA: Diagnosis not present

## 2023-09-08 DIAGNOSIS — F411 Generalized anxiety disorder: Secondary | ICD-10-CM

## 2023-09-08 DIAGNOSIS — G4733 Obstructive sleep apnea (adult) (pediatric): Secondary | ICD-10-CM

## 2023-09-08 NOTE — Patient Instructions (Signed)

## 2023-09-08 NOTE — Progress Notes (Signed)
[facility] [address] [place], [place] [address]  Pulmonary Sleep Medicine   Office Visit Note  Patient Name: Susan [name] DOB: 1956-09-22 MRN [id number]  Date of Service: 09/08/2023  Complaints/HPI: She states she is having some issues with the CPAP. She states she is needs to turn up the humidity. She also states feels hyped up and has difficulty falling asleep. She states she came off her sleeping pills and now is worse. She is trying to staying off the sleeping pills. She staets she is taking soma. I reviewed the side effects of her meds with her. She is going to work on getting her meds under control  Office Spirometry Results:     ROS  General: (-) fever, (-) chills, (-) night sweats, (-) weakness Skin: (-) rashes, (-) itching,. Eyes: (-) visual changes, (-) redness, (-) itching. Nose and Sinuses: (-) nasal stuffiness or itchiness, (-) postnasal drip, (-) nosebleeds, (-) sinus trouble. Mouth and Throat: (-) sore throat, (-) hoarseness. Neck: (-) swollen glands, (-) enlarged thyroid, (-) neck pain. Respiratory: - cough, (-) bloody sputum, - shortness of breath, - wheezing. Cardiovascular: - ankle swelling, (-) chest pain. Lymphatic: (-) lymph node enlargement. Neurologic: (-) numbness, (-) tingling. Psychiatric: (-) anxiety, (-) depression   Current Medication: Outpatient Encounter Medications as of 09/08/2023  Medication Sig   acetaminophen (TYLENOL) 500 MG tablet Take by mouth as needed.   albuterol (PROVENTIL) (2.5 MG/3ML) 0.083% nebulizer solution Take 3 mLs (2.5 mg total) by nebulization every 6 (six) hours as needed for wheezing or shortness of breath.   albuterol (VENTOLIN HFA) 108 (90 Base) MCG/ACT inhaler Inhale 2 puffs into the lungs every 6 (six) hours as needed for wheezing or shortness of breath.   ALPRAZolam (XANAX) 0.5 MG tablet Take 1 tablet (0.5 mg total) by mouth 3 (three) times daily as needed for anxiety. for anxiety    amitriptyline (ELAVIL) 25 MG tablet Take 1 tablet (25 mg total) by mouth at bedtime.   Ascorbic Acid (VITAMIN C) 100 MG tablet Take 100 mg by mouth daily.    azelastine (ASTELIN) 0.1 % nasal spray Place 2 sprays into both nostrils 2 (two) times daily. Use in each nostril as directed   carisoprodol (SOMA) 350 MG tablet Take 0.5 tablets (175 mg total) by mouth at bedtime.   cyanocobalamin (VITAMIN B12) 1000 MCG/ML injection INJECT INTRAMUSCULARLY EACH MONTH   Dentifrices (BIOTENE DRY MOUTH DT) by Transmucosal route.   eletriptan (RELPAX) 40 MG tablet Take 40 mg by mouth as needed for migraine. may repeat in 2 hours if necessary    estradiol (CLIMARA) 0.05 mg/24hr patch APPLY 1 PATCH TO SKIN ONCE WEEKLY   Estradiol (YUVAFEM) 10 MCG TABS vaginal tablet Place 1 tablet (10 mcg total) vaginally 2 (two) times a week.   ezetimibe (ZETIA) 10 MG tablet Take 1 tablet (10 mg total) by mouth daily.   fenofibrate (TRICOR) 145 MG tablet Take 1 tablet (145 mg total) by mouth daily.   fluticasone (FLONASE) 50 MCG/ACT nasal spray Place 2 sprays into both nostrils daily.   fluticasone furoate-vilanterol (BREO ELLIPTA) 100-25 MCG/ACT AEPB Inhale 1 puff into the lungs daily.   folic acid (FOLVITE) 1 MG tablet Take 1 tablet (1 mg total) by mouth daily.   gabapentin (NEURONTIN) 600 MG tablet Take 1/2 (one-half) tablet by mouth daily   HYDROcodone-acetaminophen (NORCO) 7.5-325 MG tablet One tab po tid for pain prn only   hydroxychloroquine (PLAQUENIL) 200 MG tablet Take 1 tablet (200  mg total) by mouth 2 (two) times daily.   hyoscyamine (ANASPAZ) 0.125 MG TBDP disintergrating tablet Place 0.125 mg under the tongue every 4 (four) hours as needed for bladder spasms.    Lactobacillus (DIGESTIVE HEALTH PROBIOTIC PO) Take 1 tablet by mouth daily.    levothyroxine (SYNTHROID) 137 MCG tablet Take 1 tablet (137 mcg total) by mouth daily before breakfast.   LINZESS 290 MCG CAPS capsule Take 1 capsule (290 mcg total) by mouth  daily.   Magnesium Oxide (NATRUL MAGNESIUM PO) Take by mouth.    montelukast (SINGULAIR) 10 MG tablet TAKE 1 TABLET BY MOUTH ONCE DAILY FOR ASTHMA   Multiple Vitamins-Minerals (ZINC PO) Take by mouth daily.   mupirocin ointment (BACTROBAN) 2 % as needed.   pantoprazole (PROTONIX) 40 MG tablet Take 40 mg by mouth daily.    Polyethyl Glycol-Propyl Glycol 0.4-0.3 % SOLN Apply to eye.    Probiotic Product (PHILLIPS COLON HEALTH) CAPS Take by mouth.    sodium chloride (OCEAN) 0.65 % SOLN nasal spray Place 1 spray into both nostrils as needed for congestion.    SUMAtriptan-naproxen (TREXIMET) 85-500 MG tablet daily as needed.    SYRINGE-NEEDLE, DISP, 3 ML (B-D 3CC LUER-LOK SYR 25GX5/8") 25G X 5/8" 3 ML MISC USE AS DIRECTED ONCE  A  MONTH   venlafaxine XR (EFFEXOR-XR) 150 MG 24 hr capsule Take 2 capsules (300 mg total) by mouth at bedtime.   Vitamin D, Ergocalciferol, (DRISDOL) 1.25 MG (50000 UNIT) CAPS capsule Take 1 capsule (50,000 Units total) by mouth every 7 (seven) days. Monday   vitamin E 1000 UNIT capsule Take by mouth.    No facility-administered encounter medications on file as of 09/08/2023.    Surgical History: Past Surgical History:  Procedure Laterality Date   ABDOMINAL HYSTERECTOMY  2006   BLADDER REPAIR     BREAST CYST ASPIRATION Left yrs ago   CHOLECYSTECTOMY  1989   COLONOSCOPY N/A 10/16/2021   Procedure: COLONOSCOPY;  Surgeon: Earline Mayotte, MD;  Location: [facility] ENDOSCOPY;  Service: Endoscopy;  Laterality: N/A;   COLONOSCOPY WITH ESOPHAGOGASTRODUODENOSCOPY (EGD)     COLONOSCOPY WITH PROPOFOL N/A 08/03/2017   Procedure: COLONOSCOPY WITH PROPOFOL;  Surgeon: Christena Deem, MD;  Location: [facility] ENDOSCOPY;  Service: Endoscopy;  Laterality: N/A;   COLONOSCOPY WITH PROPOFOL N/A 10/18/2018   Procedure: COLONOSCOPY WITH PROPOFOL;  Surgeon: Christena Deem, MD;  Location: [facility] ENDOSCOPY;  Service: Endoscopy;  Laterality: N/A;   ESOPHAGOGASTRODUODENOSCOPY N/A 10/07/2013    Procedure: ESOPHAGOGASTRODUODENOSCOPY (EGD);  Surgeon: West Bali, MD;  Location: [facility] ENDO SUITE;  Service: Endoscopy;  Laterality: N/A;  patient received heparin at 530am given phenergan 25mg  IV 30 minutes before   ESOPHAGOGASTRODUODENOSCOPY     x4   EYE SURGERY     lacrimal gland    FLEXIBLE BRONCHOSCOPY N/A 06/04/2016   Procedure: FLEXIBLE BRONCHOSCOPY;  Surgeon: Yevonne Pax, MD;  Location: [facility] ORS;  Service: Pulmonary;  Laterality: N/A;   INCONTINENCE SURGERY  2004   NASAL SINUS SURGERY     THYROID LOBECTOMY     TOTAL THYROIDECTOMY      Medical History: Past Medical History:  Diagnosis Date   Arthritis    Asthma    Asthmatic bronchitis    BRCA negative 05/2014   MyRisk neg   Celiac disease    Celiac sprue    Chronic anxiety    Complication of anesthesia    vomiting   COPD (chronic obstructive pulmonary disease) (HCC)    Depression  Environmental allergies    Family history of breast cancer 05/2014   MyRisk neg; IBIS=29.5%   Fibromyalgia    neuropathy all over   Gastritis 10/08/2013   GERD (gastroesophageal reflux disease)    Hyperlipemia    Hyperlipidemia    Intolerant to statins   Hypothyroidism (acquired)    Increased risk of breast cancer 05/2014   IBIS=29.5%   Increased risk of breast cancer 05/2014   IBIS=29.5%   Migraine    Migraines    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     Family History: Family History  Problem Relation Age of Onset   Arthritis/Rheumatoid Mother        died in age 68s   Breast cancer Mother 75   Breast cancer Maternal Aunt        X 2. 50's   Breast cancer Maternal Aunt    Lung cancer Maternal Aunt    Breast cancer Maternal Aunt    Breast cancer Maternal Grandmother        57's   Pancreatitis Neg Hx    Colon cancer Neg Hx     Social History: Social History    Socioeconomic History   Marital status: Married    Spouse name: Not on file   Number of children: 0   Years of education: Not on file   Highest education level: Not on file  Occupational History   Not on file  Tobacco Use   Smoking status: Never    Passive exposure: Past   Smokeless tobacco: Never  Vaping Use   Vaping status: Never Used  Substance and Sexual Activity   Alcohol use: No   Drug use: No   Sexual activity: Not Currently    Birth control/protection: Surgical  Other Topics Concern   Not on file  Social History Narrative   Not on file   Social Determinants of Health   Financial Resource Strain: Low Risk  (03/07/2021)   Overall Financial Resource Strain (CARDIA)    Difficulty of Paying Living Expenses: Not very hard  Food Insecurity: Not on file  Transportation Needs: Not on file  Physical Activity: Not on file  Stress: Not on file  Social Connections: Not on file  Intimate Partner Violence: Not At Risk (06/12/2022)   [facility]    Threatened: Not on file    Insulted: Not on file    Physically Hurt : Not on file    Scream: Not on file    Vital Signs: Blood pressure 122/70, pulse 80, temperature 98.3 F (36.8 C), resp. rate 16, height 5\' 5"  (1.651 m), weight 183 lb 9.6 oz (83.3 kg), SpO2 99%.  Examination: General Appearance: The patient is well-developed, well-nourished, and in no distress. Skin: Gross inspection of skin unremarkable. Head: normocephalic, no gross deformities. Eyes: no gross deformities noted. ENT: ears appear grossly normal no exudates. Neck: Supple. No thyromegaly. No LAD. Respiratory: no rhonchi noted. Cardiovascular: Normal S1 and S2 without murmur or rub. Extremities: No cyanosis. pulses are equal. Neurologic: Alert and oriented. No involuntary movements.  LABS: Recent Results (from the past 2160 hour(s))  CBC with Differential/Platelet     Status: None   Collection Time: 06/30/23   9:16 AM  Result Value Ref Range   WBC 5.5 3.4 - 10.8 x10E3/uL   RBC  4.36 3.77 - 5.28 x10E6/uL   Hemoglobin 13.0 11.1 - 15.9 g/dL   Hematocrit 64.3 32.9 - 46.6 %   MCV 89 79 - 97 fL   MCH 29.8 26.6 - 33.0 pg   MCHC 33.3 31.5 - 35.7 g/dL   RDW 51.8 84.1 - 66.0 %   Platelets 337 150 - 450 x10E3/uL   Neutrophils 52 Not Estab. %   Lymphs 34 Not Estab. %   Monocytes 9 Not Estab. %   Eos 4 Not Estab. %   Basos 1 Not Estab. %   Neutrophils Absolute 2.8 1.4 - 7.0 x10E3/uL   Lymphocytes Absolute 1.9 0.7 - 3.1 x10E3/uL   Monocytes Absolute 0.5 0.1 - 0.9 x10E3/uL   EOS (ABSOLUTE) 0.2 0.0 - 0.4 x10E3/uL   Basophils Absolute 0.1 0.0 - 0.2 x10E3/uL   Immature Granulocytes 0 Not Estab. %   Immature Grans (Abs) 0.0 0.0 - 0.1 x10E3/uL  CMP14+EGFR     Status: None   Collection Time: 06/30/23  9:16 AM  Result Value Ref Range   Glucose 97 70 - 99 mg/dL   BUN 14 8 - 27 mg/dL   Creatinine, Ser 6.30 0.57 - 1.00 mg/dL   eGFR 63 >16 WF/UXN/2.35   BUN/Creatinine Ratio 14 12 - 28   Sodium 141 134 - 144 mmol/L   Potassium 4.3 3.5 - 5.2 mmol/L   Chloride 106 96 - 106 mmol/L   CO2 23 20 - 29 mmol/L   Calcium 9.7 8.7 - 10.3 mg/dL   Total Protein 7.0 6.0 - 8.5 g/dL   Albumin 4.1 3.9 - 4.9 g/dL   Globulin, Total 2.9 1.5 - 4.5 g/dL   Bilirubin Total 0.2 0.0 - 1.2 mg/dL   Alkaline Phosphatase 73 44 - 121 IU/L   AST 15 0 - 40 IU/L   ALT 16 0 - 32 IU/L  Lipid Profile     Status: None   Collection Time: 06/30/23  9:16 AM  Result Value Ref Range   Cholesterol, Total 152 100 - 199 mg/dL   Triglycerides 573 0 - 149 mg/dL   HDL 53 >22 mg/dL   VLDL Cholesterol Cal 19 5 - 40 mg/dL   LDL Chol Calc (NIH) 80 0 - 99 mg/dL   Chol/HDL Ratio 2.9 0.0 - 4.4 ratio    Comment:                                   T. Chol/HDL Ratio                                             Men  Women                               1/2 Avg.Risk  3.4    3.3                                   Avg.Risk  5.0    4.4                                 2X Avg.Risk  9.6  7.1                                3X Avg.Risk 23.4   11.0   TSH + free T4     Status: None   Collection Time: 06/30/23  9:16 AM  Result Value Ref Range   TSH 0.802 0.450 - 4.500 uIU/mL   Free T4 1.27 0.82 - 1.77 ng/dL  Y78 and Folate Panel     Status: None   Collection Time: 06/30/23  9:16 AM  Result Value Ref Range   Vitamin B-12 481 232 - 1,245 pg/mL   Folate 18.8 >3.0 ng/mL    Comment: A serum folate concentration of less than 3.1 ng/mL is considered to represent clinical deficiency.   Vitamin D (25 hydroxy)     Status: None   Collection Time: 06/30/23  9:16 AM  Result Value Ref Range   Vit D, 25-Hydroxy 36.9 30.0 - 100.0 ng/mL    Comment: Vitamin D deficiency has been defined by the Institute of Medicine and an Endocrine Society practice guideline as a level of serum 25-OH vitamin D less than 20 ng/mL (1,2). The Endocrine Society went on to further define vitamin D insufficiency as a level between 21 and 29 ng/mL (2). 1. IOM (Institute of Medicine). 2010. Dietary reference    intakes for calcium and D. [place]: The    [employer]. 2. Holick MF, Binkley [place], Bischoff-Ferrari HA, et al.    Evaluation, treatment, and prevention of vitamin D    deficiency: an Endocrine Society clinical practice    guideline. JCEM. 2011 Jul; 96(7):1911-30.   Iron, TIBC and Ferritin Panel     Status: None   Collection Time: 06/30/23  9:16 AM  Result Value Ref Range   Total Iron Binding Capacity 349 250 - 450 ug/dL   UIBC 295 621 - 308 ug/dL   Iron 59 27 - 657 ug/dL   Iron Saturation 17 15 - 55 %   Ferritin 120 15 - 150 ng/mL    Radiology: No results found.  No results found.  No results found.  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   Essential tremor 06/20/2020   OSA (obstructive sleep apnea) 03/26/2020   Chronic wrist pain, right 11/02/2019   Chronic pain of left wrist 11/02/2019   Pain in joints of right hand 11/02/2019   Pain in joint of left hand 11/02/2019   Primary osteoarthritis of both hands 11/02/2019   MDD (major depressive disorder), recurrent, 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/05/2019   Dysuria 04/03/2019   Generalized anxiety disorder 02/28/2019   Chronic pain of both knees 03/01/2018   Encounter for general adult medical examination with abnormal findings 03/01/2018   Primary osteoarthritis of both first carpometacarpal joints 03/01/2018   Chronic obstructive pulmonary disease, unspecified (HCC) 12/04/2017   Shortness of breath 12/04/2017   Allergic rhinitis, unspecified 12/04/2017   Low grade squamous intraepith lesion on cytologic smear vagina (lgsil) 09/09/2017   Vaginal atrophy 09/01/2017   Chronic right-sided low back pain without sciatica 01/05/2017   Primary Sjogren's syndrome (HCC) 01/05/2017   CD (  celiac disease) 04/17/2015   Gastroesophageal reflux disease with esophagitis 04/17/2015   Hx of Sjogren's disease (HCC) 04/17/2015   Acute asthma exacerbation 10/14/2013   Acute respiratory failure (HCC) 10/14/2013   Gastritis 10/08/2013   Bradycardia 10/07/2013   Esophageal spasm 10/06/2013   Substernal precordial chest pain 10/06/2013   Chronic anxiety 10/06/2013   GERD (gastroesophageal reflux disease) 10/06/2013   Chronic asthma 10/06/2013   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 OSA (obstructive sleep apnea)  Continue with CPAP therapy adjust the med id he according to needs of the patient.  2. Fibromyalgia   A  follow-up per PCP  3. Generalized anxiety disorder  Follow-up with PCP this may also contribute to some degree her difficulty with the sleep disorder.   General Counseling: I have discussed the findings of the evaluation and examination with Li.  I have also discussed any further diagnostic evaluation thatmay be needed or ordered today. Rion verbalizes understanding of the findings of todays visit. We also reviewed her medications today and discussed drug interactions and side effects including but not limited excessive drowsiness and altered mental states. We also discussed that there is always a risk not just to her but also people around her. she has been encouraged to call the office with any questions or concerns that should arise related to todays visit.  No orders of the defined types were placed in this encounter.    Time spent: 75  I have personally obtained a history, examined the patient, evaluated laboratory and imaging results, formulated the assessment and plan and placed orders.    Yevonne Pax, MD [facility] Pulmonary and Critical Care Sleep medicine

## 2023-09-09 ENCOUNTER — Encounter: Payer: Self-pay | Admitting: Rheumatology

## 2023-09-09 ENCOUNTER — Telehealth: Payer: Self-pay

## 2023-09-09 ENCOUNTER — Other Ambulatory Visit: Payer: Self-pay

## 2023-09-09 ENCOUNTER — Ambulatory Visit: Payer: Medicare HMO | Attending: Rheumatology | Admitting: Rheumatology

## 2023-09-09 VITALS — BP 122/77 | HR 106 | Resp 15 | Ht 65.0 in | Wt 185.8 lb

## 2023-09-09 DIAGNOSIS — Z85850 Personal history of malignant neoplasm of thyroid: Secondary | ICD-10-CM

## 2023-09-09 DIAGNOSIS — M545 Low back pain, unspecified: Secondary | ICD-10-CM | POA: Diagnosis not present

## 2023-09-09 DIAGNOSIS — G8929 Other chronic pain: Secondary | ICD-10-CM

## 2023-09-09 DIAGNOSIS — Z8261 Family history of arthritis: Secondary | ICD-10-CM

## 2023-09-09 DIAGNOSIS — K900 Celiac disease: Secondary | ICD-10-CM

## 2023-09-09 DIAGNOSIS — K224 Dyskinesia of esophagus: Secondary | ICD-10-CM

## 2023-09-09 DIAGNOSIS — Z8719 Personal history of other diseases of the digestive system: Secondary | ICD-10-CM

## 2023-09-09 DIAGNOSIS — M170 Bilateral primary osteoarthritis of knee: Secondary | ICD-10-CM

## 2023-09-09 DIAGNOSIS — Z79899 Other long term (current) drug therapy: Secondary | ICD-10-CM | POA: Diagnosis not present

## 2023-09-09 DIAGNOSIS — M19072 Primary osteoarthritis, left ankle and foot: Secondary | ICD-10-CM

## 2023-09-09 DIAGNOSIS — G894 Chronic pain syndrome: Secondary | ICD-10-CM | POA: Diagnosis not present

## 2023-09-09 DIAGNOSIS — J449 Chronic obstructive pulmonary disease, unspecified: Secondary | ICD-10-CM

## 2023-09-09 DIAGNOSIS — M25511 Pain in right shoulder: Secondary | ICD-10-CM | POA: Diagnosis not present

## 2023-09-09 DIAGNOSIS — M3509 Sicca syndrome with other organ involvement: Secondary | ICD-10-CM

## 2023-09-09 DIAGNOSIS — M19071 Primary osteoarthritis, right ankle and foot: Secondary | ICD-10-CM

## 2023-09-09 DIAGNOSIS — M797 Fibromyalgia: Secondary | ICD-10-CM | POA: Diagnosis not present

## 2023-09-09 DIAGNOSIS — M19041 Primary osteoarthritis, right hand: Secondary | ICD-10-CM | POA: Diagnosis not present

## 2023-09-09 DIAGNOSIS — F3341 Major depressive disorder, recurrent, in partial remission: Secondary | ICD-10-CM

## 2023-09-09 DIAGNOSIS — R42 Dizziness and giddiness: Secondary | ICD-10-CM

## 2023-09-09 DIAGNOSIS — Z1231 Encounter for screening mammogram for malignant neoplasm of breast: Secondary | ICD-10-CM

## 2023-09-09 DIAGNOSIS — F411 Generalized anxiety disorder: Secondary | ICD-10-CM

## 2023-09-09 DIAGNOSIS — M19042 Primary osteoarthritis, left hand: Secondary | ICD-10-CM

## 2023-09-09 DIAGNOSIS — Z8709 Personal history of other diseases of the respiratory system: Secondary | ICD-10-CM

## 2023-09-09 MED ORDER — HYDROXYCHLOROQUINE SULFATE 200 MG PO TABS: 200.0000 mg | ORAL_TABLET | Freq: Two times a day (BID) | ORAL | 0 refills | Status: DC

## 2023-09-09 NOTE — Patient Instructions (Signed)

## 2023-09-09 NOTE — Telephone Encounter (Signed)
Pt calling for a mammogram order to be sent to [facility]; is trying to get everything done before Dec 3rd d/t a special lengthy trip.  [phone]

## 2023-09-09 NOTE — Telephone Encounter (Signed)
Pt aware.

## 2023-09-15 DIAGNOSIS — Z79899 Other long term (current) drug therapy: Secondary | ICD-10-CM | POA: Diagnosis not present

## 2023-09-21 ENCOUNTER — Ambulatory Visit (INDEPENDENT_AMBULATORY_CARE_PROVIDER_SITE_OTHER): Payer: Medicare HMO | Admitting: Nurse Practitioner

## 2023-09-21 ENCOUNTER — Ambulatory Visit (INDEPENDENT_AMBULATORY_CARE_PROVIDER_SITE_OTHER): Payer: Medicare HMO | Admitting: Obstetrics & Gynecology

## 2023-09-21 ENCOUNTER — Other Ambulatory Visit (HOSPITAL_COMMUNITY)
Admission: RE | Admit: 2023-09-21 | Discharge: 2023-09-21 | Disposition: A | Discharge status: Home / Self Care | Payer: Medicare HMO | Source: Ambulatory Visit | Attending: Obstetrics & Gynecology | Admitting: Obstetrics & Gynecology

## 2023-09-21 ENCOUNTER — Encounter: Payer: Self-pay | Admitting: Nurse Practitioner

## 2023-09-21 VITALS — BP 133/84 | HR 77 | Ht 65.0 in | Wt 182.1 lb

## 2023-09-21 VITALS — BP 128/80 | HR 78 | Temp 98.6°F | Resp 16 | Ht 65.0 in | Wt 186.0 lb

## 2023-09-21 DIAGNOSIS — Z1151 Encounter for screening for human papillomavirus (HPV): Secondary | ICD-10-CM | POA: Insufficient documentation

## 2023-09-21 DIAGNOSIS — Z01419 Encounter for gynecological examination (general) (routine) without abnormal findings: Secondary | ICD-10-CM

## 2023-09-21 DIAGNOSIS — N766 Ulceration of vulva: Secondary | ICD-10-CM

## 2023-09-21 DIAGNOSIS — Z1272 Encounter for screening for malignant neoplasm of vagina: Secondary | ICD-10-CM

## 2023-09-21 DIAGNOSIS — M3500 Sicca syndrome, unspecified: Secondary | ICD-10-CM | POA: Diagnosis not present

## 2023-09-21 DIAGNOSIS — K130 Diseases of lips: Secondary | ICD-10-CM

## 2023-09-21 DIAGNOSIS — R87620 Atypical squamous cells of undetermined significance on cytologic smear of vagina (ASC-US): Secondary | ICD-10-CM

## 2023-09-21 DIAGNOSIS — Z79899 Other long term (current) drug therapy: Secondary | ICD-10-CM

## 2023-09-21 DIAGNOSIS — Z0000 Encounter for general adult medical examination without abnormal findings: Secondary | ICD-10-CM

## 2023-09-21 DIAGNOSIS — E782 Mixed hyperlipidemia: Secondary | ICD-10-CM

## 2023-09-21 DIAGNOSIS — M79644 Pain in right finger(s): Secondary | ICD-10-CM

## 2023-09-21 DIAGNOSIS — M797 Fibromyalgia: Secondary | ICD-10-CM

## 2023-09-21 DIAGNOSIS — F411 Generalized anxiety disorder: Secondary | ICD-10-CM | POA: Diagnosis not present

## 2023-09-21 DIAGNOSIS — Z01411 Encounter for gynecological examination (general) (routine) with abnormal findings: Secondary | ICD-10-CM | POA: Diagnosis present

## 2023-09-21 DIAGNOSIS — M25531 Pain in right wrist: Secondary | ICD-10-CM

## 2023-09-21 MED ORDER — EZETIMIBE 10 MG PO TABS: 10.0000 mg | ORAL_TABLET | Freq: Every day | ORAL | 3 refills | Status: AC

## 2023-09-21 MED ORDER — MUPIROCIN 2 % EX OINT: TOPICAL_OINTMENT | CUTANEOUS | 0 refills | Status: DC | PRN

## 2023-09-21 MED ORDER — ALPRAZOLAM 0.5 MG PO TABS: 0.5000 mg | ORAL_TABLET | Freq: Three times a day (TID) | ORAL | 0 refills | Status: DC | PRN

## 2023-09-21 MED ORDER — FENOFIBRATE 145 MG PO TABS: 145.0000 mg | ORAL_TABLET | Freq: Every day | ORAL | 1 refills | Status: DC

## 2023-09-21 MED ORDER — AMITRIPTYLINE HCL 25 MG PO TABS: 25.0000 mg | ORAL_TABLET | Freq: Every day | ORAL | 1 refills | Status: DC

## 2023-09-21 MED ORDER — GABAPENTIN 600 MG PO TABS: ORAL_TABLET | ORAL | 3 refills | Status: AC

## 2023-09-21 MED ORDER — ALPRAZOLAM 0.5 MG PO TABS: 0.5000 mg | ORAL_TABLET | Freq: Three times a day (TID) | ORAL | 2 refills | Status: DC | PRN

## 2023-09-21 MED ORDER — FLUTICASONE PROPIONATE 50 MCG/ACT NA SUSP: 2.0000 | Freq: Every day | NASAL | 3 refills | Status: AC

## 2023-09-21 NOTE — Progress Notes (Signed)
ANNUAL PREVENTATIVE CARE GYNECOLOGY  ENCOUNTER NOTE  Subjective:       Susan [name] is a 67 y.o. married G0 here for a routine annual gynecologic exam. She is here a month early because she will be out of state for the next 4 months. She says that she called her insurance company and got approval for this.  The patient is not currently sexually active. She says that her vagina is very tiny and sex is painful. The patient is taking hormone replacement therapy and vaginal estrogen. She wants a refill. She tried to stop taking the ERT but found that she really needed it. She has had a hysterectomy.  Patient denies post-menopausal vaginal bleeding. The patient wears seatbelts: yes. The patient participates in regular exercise: yes. Has the patient ever been transfused or tattooed?: no. The patient reports that there is not domestic violence in her life.  Current complaints: 1.  She wants a pap smear, continues to have ASCUS paps of her vaginal cuff.   Gynecologic History No LMP recorded. Patient has had a hysterectomy.  Last Pap: 2023. Results were: ASCUS Last mamContraception:  09/2022. Results were: normal  Strong FH of breast cancer, genetic testing negative   Obstetric History OB History  Gravida Para Term Preterm AB Living  0 0 0 0 0 0  SAB IAB Ectopic Multiple Live Births  0 0 0 0 0    Past Medical History:  Diagnosis Date   Arthritis    Asthma    Asthmatic bronchitis    BRCA negative 05/2014   MyRisk neg   Celiac disease    Celiac sprue    Chronic anxiety    Complication of anesthesia    vomiting   COPD (chronic obstructive pulmonary disease) (HCC)    Depression    Environmental allergies    Family history of breast cancer 05/2014   MyRisk neg; IBIS=29.5%   Fibromyalgia    neuropathy all over   Gastritis 10/08/2013   GERD (gastroesophageal reflux disease)    Hyperlipemia    Hyperlipidemia    Intolerant to statins   Hypothyroidism (acquired)    Increased  risk of breast cancer 05/2014   IBIS=29.5%   Increased risk of breast cancer 05/2014   IBIS=29.5%   Migraine    Migraines    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     Family History  Problem Relation Age of Onset   Arthritis/Rheumatoid Mother        died in age 60s   Breast cancer Mother 50   Breast cancer Maternal Aunt        X 2. 50's   Breast cancer Maternal Aunt    Lung cancer Maternal Aunt    Breast cancer Maternal Aunt    Breast cancer Maternal Grandmother        29's   Pancreatitis Neg Hx    Colon cancer Neg Hx     Past Surgical History:  Procedure Laterality Date   ABDOMINAL HYSTERECTOMY  2006   BLADDER REPAIR     BREAST CYST ASPIRATION Left yrs ago   CHOLECYSTECTOMY  1989   COLONOSCOPY N/A 10/16/2021   Procedure: COLONOSCOPY;  Surgeon: Earline Mayotte, MD;  Location: [facility] ENDOSCOPY;  Service: Endoscopy;  Laterality: N/A;   COLONOSCOPY WITH ESOPHAGOGASTRODUODENOSCOPY (EGD)     COLONOSCOPY  WITH PROPOFOL N/A 08/03/2017   Procedure: COLONOSCOPY WITH PROPOFOL;  Surgeon: Christena Deem, MD;  Location: [facility] ENDOSCOPY;  Service: Endoscopy;  Laterality: N/A;   COLONOSCOPY WITH PROPOFOL N/A 10/18/2018   Procedure: COLONOSCOPY WITH PROPOFOL;  Surgeon: Christena Deem, MD;  Location: [facility] ENDOSCOPY;  Service: Endoscopy;  Laterality: N/A;   ESOPHAGOGASTRODUODENOSCOPY N/A 10/07/2013   Procedure: ESOPHAGOGASTRODUODENOSCOPY (EGD);  Surgeon: West Bali, MD;  Location: [facility] ENDO SUITE;  Service: Endoscopy;  Laterality: N/A;  patient received heparin at 530am given phenergan 25mg  IV 30 minutes before   ESOPHAGOGASTRODUODENOSCOPY     x4   EYE SURGERY     lacrimal gland    FLEXIBLE BRONCHOSCOPY N/A 06/04/2016   Procedure: FLEXIBLE BRONCHOSCOPY;  Surgeon: Yevonne Pax, MD;  Location: [facility] ORS;  Service: Pulmonary;  Laterality:  N/A;   INCONTINENCE SURGERY  2004   NASAL SINUS SURGERY     THYROID LOBECTOMY     TOTAL THYROIDECTOMY      Social History   Socioeconomic History   Marital status: Married    Spouse name: Not on file   Number of children: 0   Years of education: Not on file   Highest education level: Not on file  Occupational History   Not on file  Tobacco Use   Smoking status: Never    Passive exposure: Past   Smokeless tobacco: Never  Vaping Use   Vaping status: Never Used  Substance and Sexual Activity   Alcohol use: No   Drug use: No   Sexual activity: Not Currently    Birth control/protection: Surgical  Other Topics Concern   Not on file  Social History Narrative   Not on file   Social Determinants of Health   Financial Resource Strain: Low Risk  (03/07/2021)   Overall Financial Resource Strain (CARDIA)    Difficulty of Paying Living Expenses: Not very hard  Food Insecurity: Not on file  Transportation Needs: Not on file  Physical Activity: Not on file  Stress: Not on file  Social Connections: Not on file  Intimate Partner Violence: Not At Risk (06/12/2022)   [facility]    Threatened: Not on file    Insulted: Not on file    Physically Hurt : Not on file    Scream: Not on file    Current Outpatient Medications on File Prior to Visit  Medication Sig Dispense Refill   acetaminophen (TYLENOL) 500 MG tablet Take by mouth as needed.     albuterol (PROVENTIL) (2.5 MG/3ML) 0.083% nebulizer solution Take 3 mLs (2.5 mg total) by nebulization every 6 (six) hours as needed for wheezing or shortness of breath. 75 mL 6   albuterol (VENTOLIN HFA) 108 (90 Base) MCG/ACT inhaler Inhale 2 puffs into the lungs every 6 (six) hours as needed for wheezing or shortness of breath. 1 each 2   ALPRAZolam (XANAX) 0.5 MG tablet Take 1 tablet (0.5 mg total) by mouth 3 (three) times daily as needed for anxiety. for anxiety 90 tablet 1   amitriptyline (ELAVIL)  25 MG tablet Take 1 tablet (25 mg total) by mouth at bedtime. 90 tablet 1   Ascorbic Acid (VITAMIN C) 100 MG tablet Take 100 mg by mouth daily.      azelastine (ASTELIN) 0.1 % nasal spray Place 2 sprays into both nostrils 2 (two) times daily. Use in each nostril as directed 30 mL 4   carisoprodol (SOMA) 350 MG tablet Take 0.5 tablets (175  mg total) by mouth at bedtime. (Patient taking differently: Take 175 mg by mouth at bedtime as needed.) 45 tablet 2   Dentifrices (BIOTENE DRY MOUTH DT) by Transmucosal route daily.     eletriptan (RELPAX) 40 MG tablet Take 40 mg by mouth as needed for migraine. may repeat in 2 hours if necessary      estradiol (CLIMARA) 0.05 mg/24hr patch APPLY 1 PATCH TO SKIN ONCE WEEKLY 12 patch 3   Estradiol (YUVAFEM) 10 MCG TABS vaginal tablet Place 1 tablet (10 mcg total) vaginally 2 (two) times a week. 24 tablet 1   ezetimibe (ZETIA) 10 MG tablet Take 1 tablet (10 mg total) by mouth daily. 90 tablet 3   fenofibrate (TRICOR) 145 MG tablet Take 1 tablet (145 mg total) by mouth daily. 90 tablet 1   fluticasone (FLONASE) 50 MCG/ACT nasal spray Place 2 sprays into both nostrils daily. 16 g 3   fluticasone furoate-vilanterol (BREO ELLIPTA) 100-25 MCG/ACT AEPB Inhale 1 puff into the lungs daily. 180 each 3   gabapentin (NEURONTIN) 600 MG tablet Take 1/2 (one-half) tablet by mouth daily 45 tablet 3   HYDROcodone-acetaminophen (NORCO) 7.5-325 MG tablet One tab po tid for pain prn only 30 tablet 0   hydroxychloroquine (PLAQUENIL) 200 MG tablet Take 1 tablet (200 mg total) by mouth 2 (two) times daily. 180 tablet 0   hyoscyamine (ANASPAZ) 0.125 MG TBDP disintergrating tablet Place 0.125 mg under the tongue every 4 (four) hours as needed for bladder spasms.      Lactobacillus (DIGESTIVE HEALTH PROBIOTIC PO) Take 1 tablet by mouth daily.      levothyroxine (SYNTHROID) 137 MCG tablet Take 1 tablet (137 mcg total) by mouth daily before breakfast. 90 tablet 3   LINZESS 290 MCG CAPS capsule  Take 1 capsule (290 mcg total) by mouth daily. 90 capsule 3   Magnesium Oxide (NATRUL MAGNESIUM PO) Take by mouth.      montelukast (SINGULAIR) 10 MG tablet TAKE 1 TABLET BY MOUTH ONCE DAILY FOR ASTHMA 90 tablet 1   Multiple Vitamins-Minerals (ZINC PO) Take by mouth daily.     mupirocin ointment (BACTROBAN) 2 % as needed.  0   pantoprazole (PROTONIX) 40 MG tablet Take 40 mg by mouth daily.      Polyethyl Glycol-Propyl Glycol 0.4-0.3 % SOLN Apply to eye.      Probiotic Product (PHILLIPS COLON HEALTH) CAPS Take by mouth.      sodium chloride (OCEAN) 0.65 % SOLN nasal spray Place 1 spray into both nostrils as needed for congestion.      SUMAtriptan-naproxen (TREXIMET) 85-500 MG tablet daily as needed.      SYRINGE-NEEDLE, DISP, 3 ML (B-D 3CC LUER-LOK SYR 25GX5/8") 25G X 5/8" 3 ML MISC USE AS DIRECTED ONCE  A  MONTH 1 each 0   venlafaxine XR (EFFEXOR-XR) 150 MG 24 hr capsule Take 2 capsules (300 mg total) by mouth at bedtime. 180 capsule 0   Vitamin D, Ergocalciferol, (DRISDOL) 1.25 MG (50000 UNIT) CAPS capsule Take 1 capsule (50,000 Units total) by mouth every 7 (seven) days. Monday 12 capsule 1   vitamin E 1000 UNIT capsule Take by mouth.      cyanocobalamin (VITAMIN B12) 1000 MCG/ML injection INJECT INTRAMUSCULARLY EACH MONTH (Patient not taking: Reported on 09/09/2023) 1 mL 3   folic acid (FOLVITE) 1 MG tablet Take 1 tablet (1 mg total) by mouth daily. (Patient not taking: Reported on 09/09/2023) 90 tablet 1   No current facility-administered medications on file  prior to visit.    Allergies  Allergen Reactions   Aspirin    Keflex [Cephalexin] Shortness Of Breath   Azithromycin Hives and Nausea Only   Barley Grass    Clarithromycin Other (See Comments)   Metoclopramide Other (See Comments)    TREMORS Tremors Tremors    Oat Other (See Comments)   Oatmeal Other (See Comments)   Other     Other reaction(s): Other (See Comments) KEOPLEX.   Rye Grass Flower Pollen Extract  [Gramineae Pollens]    Vioxx [Rofecoxib] Diarrhea   Wheat Other (See Comments)    Celiac disease   Wheat Extract    Celebrex [Celecoxib] Hives   Metoclopramide Hcl Anxiety    BODY TREMORS      Review of Systems ROS Review of Systems - General ROS: negative for - chills, fatigue, fever, hot flashes, night sweats, weight gain or weight loss Psychological ROS: negative for - anxiety, decreased libido, depression, mood swings, physical abuse or sexual abuse Ophthalmic ROS: negative for - blurry vision, eye pain or loss of vision ENT ROS: negative for - headaches, hearing change, visual changes or vocal changes Allergy and Immunology ROS: negative for - hives, itchy/watery eyes or seasonal allergies Hematological and Lymphatic ROS: negative for - bleeding problems, bruising, swollen lymph nodes or weight loss Endocrine ROS: negative for - galactorrhea, hair pattern changes, hot flashes, malaise/lethargy, mood swings, palpitations, polydipsia/polyuria, skin changes, temperature intolerance or unexpected weight changes Breast ROS: negative for - new or changing breast lumps or nipple discharge Respiratory ROS: negative for - cough or shortness of breath Cardiovascular ROS: negative for - chest pain, irregular heartbeat, palpitations or shortness of breath Gastrointestinal ROS: no abdominal pain, change in bowel habits, or black or bloody stools Genito-Urinary ROS: no dysuria, trouble voiding, or hematuria Musculoskeletal ROS: negative for - joint pain or joint stiffness Neurological ROS: negative for - bowel and bladder control changes Dermatological ROS: negative for rash and skin lesion changes   Objective:   BP 133/84   Pulse 77   Ht 5\' 5"  (1.651 m)   Wt 182 lb 1.6 oz (82.6 kg)   BMI 30.30 kg/m  CONSTITUTIONAL: Well-developed, well-nourished female in no acute distress.  PSYCHIATRIC: Normal mood and affect. Normal behavior. Normal judgment and thought content. NEUROLGIC: Alert and  oriented to person, place, and time. Normal muscle tone coordination. No cranial nerve deficit noted. HENT:  Normocephalic, atraumatic, External right and left ear normal. Oropharynx is clear and moist EYES: Conjunctivae and EOM are normal. Pupils are equal, round, and reactive to light. No scleral icterus.  NECK: Normal range of motion, supple, no masses.  Normal thyroid.  SKIN: Skin is warm and dry. No rash noted. Not diaphoretic. No erythema. No pallor. CARDIOVASCULAR: Normal heart rate noted, regular rhythm, no murmur. RESPIRATORY: Clear to auscultation bilaterally. Effort and breath sounds normal, no problems with respiration noted. BREASTS: Symmetric in size. No masses, skin changes, nipple drainage, or lymphadenopathy. ABDOMEN: Soft, normal bowel sounds, no distention noted.  No tenderness, rebound or guarding.  BLADDER: Normal PELVIC:  Bladder no bladder distension noted  Urethra: normal appearing urethra with no masses, tenderness or lesions  Vulva: ulceration along the length of the left labia minora  Vagina: severely atrophic, 3 cm in length, I used the tiniest plastic speculum   Bimanual exam reveals no pain or masses MUSCULOSKELETAL: Normal range of motion. No tenderness.  No cyanosis, clubbing, or edema.  2+ distal pulses. LYMPHATIC: No Axillary, Supraclavicular, or Inguinal Adenopathy.  Labs: Lab Results  Component Value Date   WBC 5.5 06/30/2023   HGB 13.0 06/30/2023   HCT 39.0 06/30/2023   MCV 89 06/30/2023   PLT 337 06/30/2023    Lab Results  Component Value Date   CREATININE 0.98 06/30/2023   BUN 14 06/30/2023   NA 141 06/30/2023   K 4.3 06/30/2023   CL 106 06/30/2023   CO2 23 06/30/2023    Lab Results  Component Value Date   ALT 16 06/30/2023   AST 15 06/30/2023   ALKPHOS 73 06/30/2023   BILITOT 0.2 06/30/2023    Lab Results  Component Value Date   CHOL 152 06/30/2023   HDL 53 06/30/2023   LDLCALC 80 06/30/2023   TRIG 104 06/30/2023   CHOLHDL  2.9 06/30/2023    Lab Results  Component Value Date   TSH 0.802 06/30/2023    No results found for: "HGBA1C"   Assessment:   1. Screening for vaginal cancer      Plan:  Pap: done Mammogram: scheduled Labs: HSV IgG Routine preventative health maintenance measures emphasized:  Flu vaccine status: already done Return to Clinic - 1 Year   Allie Bossier, MD [facility]

## 2023-09-21 NOTE — Progress Notes (Addendum)
[facility] [address] [place], [place] [address]  Internal MEDICINE  Office Visit Note  Patient Name: Susan [name]  [id number]  [id number]  Date of Service: 09/21/2023  Chief Complaint  Patient presents with   Depression   Gastroesophageal Reflux   Hyperlipidemia   Follow-up    HPI Susan [name] presents for a follow-up visit for dermatitis, anxiety, sjogrens syndrome, and right wrist pain.  Follicular dermatitis -- requesting refill of mupirocin ointment for skin issue.  Anxiety-- takes prn alprazolam  Sjogrens syndrome -- prior diagnosis, sees rheumatology.  New Right wrist pain x2 months -- radiates to thumb, slight swelling of back of thumb. Seen by rheumatologist and was told the wrist is not inflamed but it still has been persistently hurting.  Dry cracked lips -- has been going on for a while, is very painful. Has tried carmex and blistex medicated ointments, vaseline and other topical OTC treatments with no relief or improvement. Has sjogren's syndrome and sees rheumatology for this which may play a role here. Interested in seeing dermatology for further evaluation.    Current Medication: Outpatient Encounter Medications as of 09/21/2023  Medication Sig   acetaminophen (TYLENOL) 500 MG tablet Take by mouth as needed.   albuterol (PROVENTIL) (2.5 MG/3ML) 0.083% nebulizer solution Take 3 mLs (2.5 mg total) by nebulization every 6 (six) hours as needed for wheezing or shortness of breath.   albuterol (VENTOLIN HFA) 108 (90 Base) MCG/ACT inhaler Inhale 2 puffs into the lungs every 6 (six) hours as needed for wheezing or shortness of breath.   ALPRAZolam (XANAX) 0.5 MG tablet Take 1 tablet (0.5 mg total) by mouth 3 (three) times daily as needed for anxiety or sleep.   Ascorbic Acid (VITAMIN C) 100 MG tablet Take 100 mg by mouth daily.    azelastine (ASTELIN) 0.1 % nasal spray Place 2 sprays into both nostrils 2 (two) times daily. Use in each nostril as directed    carisoprodol (SOMA) 350 MG tablet Take 0.5 tablets (175 mg total) by mouth at bedtime. (Patient taking differently: Take 175 mg by mouth at bedtime as needed.)   cyanocobalamin (VITAMIN B12) 1000 MCG/ML injection INJECT INTRAMUSCULARLY EACH MONTH   Dentifrices (BIOTENE DRY MOUTH DT) by Transmucosal route daily.   eletriptan (RELPAX) 40 MG tablet Take 40 mg by mouth as needed for migraine. may repeat in 2 hours if necessary    estradiol (CLIMARA) 0.05 mg/24hr patch APPLY 1 PATCH TO SKIN ONCE WEEKLY   Estradiol (YUVAFEM) 10 MCG TABS vaginal tablet Place 1 tablet (10 mcg total) vaginally 2 (two) times a week.   fluticasone furoate-vilanterol (BREO ELLIPTA) 100-25 MCG/ACT AEPB Inhale 1 puff into the lungs daily.   folic acid (FOLVITE) 1 MG tablet Take 1 tablet (1 mg total) by mouth daily.   HYDROcodone-acetaminophen (NORCO) 7.5-325 MG tablet One tab po tid for pain prn only   hydroxychloroquine (PLAQUENIL) 200 MG tablet Take 1 tablet (200 mg total) by mouth 2 (two) times daily.   hyoscyamine (ANASPAZ) 0.125 MG TBDP disintergrating tablet Place 0.125 mg under the tongue every 4 (four) hours as needed for bladder spasms.    Lactobacillus (DIGESTIVE HEALTH PROBIOTIC PO) Take 1 tablet by mouth daily.    levothyroxine (SYNTHROID) 137 MCG tablet Take 1 tablet (137 mcg total) by mouth daily before breakfast.   LINZESS 290 MCG CAPS capsule Take 1 capsule (290 mcg total) by mouth daily.   Magnesium Oxide (NATRUL MAGNESIUM PO) Take by mouth.    montelukast (  SINGULAIR) 10 MG tablet TAKE 1 TABLET BY MOUTH ONCE DAILY FOR ASTHMA   Multiple Vitamins-Minerals (ZINC PO) Take by mouth daily.   pantoprazole (PROTONIX) 40 MG tablet Take 40 mg by mouth daily.    Polyethyl Glycol-Propyl Glycol 0.4-0.3 % SOLN Apply to eye.    Probiotic Product (PHILLIPS COLON HEALTH) CAPS Take by mouth.    sodium chloride (OCEAN) 0.65 % SOLN nasal spray Place 1 spray into both nostrils as needed for congestion.    SUMAtriptan-naproxen  (TREXIMET) 85-500 MG tablet daily as needed.    SYRINGE-NEEDLE, DISP, 3 ML (B-D 3CC LUER-LOK SYR 25GX5/8") 25G X 5/8" 3 ML MISC USE AS DIRECTED ONCE  A  MONTH   Vitamin D, Ergocalciferol, (DRISDOL) 1.25 MG (50000 UNIT) CAPS capsule Take 1 capsule (50,000 Units total) by mouth every 7 (seven) days. Monday   vitamin E 1000 UNIT capsule Take by mouth.    [DISCONTINUED] ALPRAZolam (XANAX) 0.5 MG tablet Take 1 tablet (0.5 mg total) by mouth 3 (three) times daily as needed for anxiety. for anxiety   [DISCONTINUED] amitriptyline (ELAVIL) 25 MG tablet Take 1 tablet (25 mg total) by mouth at bedtime.   [DISCONTINUED] ezetimibe (ZETIA) 10 MG tablet Take 1 tablet (10 mg total) by mouth daily.   [DISCONTINUED] fenofibrate (TRICOR) 145 MG tablet Take 1 tablet (145 mg total) by mouth daily.   [DISCONTINUED] fluticasone (FLONASE) 50 MCG/ACT nasal spray Place 2 sprays into both nostrils daily.   [DISCONTINUED] gabapentin (NEURONTIN) 600 MG tablet Take 1/2 (one-half) tablet by mouth daily   [DISCONTINUED] mupirocin ointment (BACTROBAN) 2 % as needed.   [DISCONTINUED] venlafaxine XR (EFFEXOR-XR) 150 MG 24 hr capsule Take 2 capsules (300 mg total) by mouth at bedtime.   ALPRAZolam (XANAX) 0.5 MG tablet Take 1 tablet (0.5 mg total) by mouth 3 (three) times daily as needed for anxiety. for anxiety   amitriptyline (ELAVIL) 25 MG tablet Take 1 tablet (25 mg total) by mouth at bedtime.   ezetimibe (ZETIA) 10 MG tablet Take 1 tablet (10 mg total) by mouth daily.   fenofibrate (TRICOR) 145 MG tablet Take 1 tablet (145 mg total) by mouth daily.   fluticasone (FLONASE) 50 MCG/ACT nasal spray Place 2 sprays into both nostrils daily.   gabapentin (NEURONTIN) 600 MG tablet Take 1/2 (one-half) tablet by mouth daily   mupirocin ointment (BACTROBAN) 2 % Apply topically as needed (dermatitis).   No facility-administered encounter medications on file as of 09/21/2023.    Surgical History: Past Surgical History:  Procedure  Laterality Date   ABDOMINAL HYSTERECTOMY  2006   BLADDER REPAIR     BREAST CYST ASPIRATION Left yrs ago   CHOLECYSTECTOMY  1989   COLONOSCOPY N/A 10/16/2021   Procedure: COLONOSCOPY;  Surgeon: Earline Mayotte, MD;  Location: [facility] ENDOSCOPY;  Service: Endoscopy;  Laterality: N/A;   COLONOSCOPY WITH ESOPHAGOGASTRODUODENOSCOPY (EGD)     COLONOSCOPY WITH PROPOFOL N/A 08/03/2017   Procedure: COLONOSCOPY WITH PROPOFOL;  Surgeon: Christena Deem, MD;  Location: [facility] ENDOSCOPY;  Service: Endoscopy;  Laterality: N/A;   COLONOSCOPY WITH PROPOFOL N/A 10/18/2018   Procedure: COLONOSCOPY WITH PROPOFOL;  Surgeon: Christena Deem, MD;  Location: [facility] ENDOSCOPY;  Service: Endoscopy;  Laterality: N/A;   ESOPHAGOGASTRODUODENOSCOPY N/A 10/07/2013   Procedure: ESOPHAGOGASTRODUODENOSCOPY (EGD);  Surgeon: West Bali, MD;  Location: [facility] ENDO SUITE;  Service: Endoscopy;  Laterality: N/A;  patient received heparin at 530am given phenergan 25mg  IV 30 minutes before   ESOPHAGOGASTRODUODENOSCOPY     x4  EYE SURGERY     lacrimal gland    FLEXIBLE BRONCHOSCOPY N/A 06/04/2016   Procedure: FLEXIBLE BRONCHOSCOPY;  Surgeon: Yevonne Pax, MD;  Location: [facility] ORS;  Service: Pulmonary;  Laterality: N/A;   INCONTINENCE SURGERY  2004   NASAL SINUS SURGERY     THYROID LOBECTOMY     TOTAL THYROIDECTOMY      Medical History: Past Medical History:  Diagnosis Date   Arthritis    Asthma    Asthmatic bronchitis    BRCA negative 05/2014   MyRisk neg   Celiac disease    Celiac sprue    Chronic anxiety    Complication of anesthesia    vomiting   COPD (chronic obstructive pulmonary disease) (HCC)    Depression    Environmental allergies    Family history of breast cancer 05/2014   MyRisk neg; IBIS=29.5%   Fibromyalgia    neuropathy all over   Gastritis 10/08/2013   GERD (gastroesophageal reflux disease)    Hyperlipemia    Hyperlipidemia    Intolerant to statins   Hypothyroidism (acquired)     Increased risk of breast cancer 05/2014   IBIS=29.5%   Increased risk of breast cancer 05/2014   IBIS=29.5%   Migraine    Migraines    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     Family History: Family History  Problem Relation Age of Onset   Arthritis/Rheumatoid Mother        died in age 72s   Breast cancer Mother 9   Breast cancer Maternal Aunt        X 2. 50's   Breast cancer Maternal Aunt    Lung cancer Maternal Aunt    Breast cancer Maternal Aunt    Breast cancer Maternal Grandmother        5's   Pancreatitis Neg Hx    Colon cancer Neg Hx     Social History   Socioeconomic History   Marital status: Married    Spouse name: Not on file   Number of children: 0   Years of education: Not on file   Highest education level: Not on file  Occupational History   Not on file  Tobacco Use   Smoking status: Never    Passive exposure: Past   Smokeless tobacco: Never  Vaping Use   Vaping status: Never Used  Substance and Sexual Activity   Alcohol use: No   Drug use: No   Sexual activity: Not Currently    Birth control/protection: Surgical  Other Topics Concern   Not on file  Social History Narrative   Not on file   Social Drivers of Health   Financial Resource Strain: Low Risk  (03/07/2021)   Overall Financial Resource Strain (CARDIA)    Difficulty of Paying Living Expenses: Not very hard  Food Insecurity: Not on file  Transportation Needs: Not on file  Physical Activity: Not on file  Stress: Not on file  Social Connections: Not on file  Intimate Partner Violence: Not At Risk (06/12/2022)   [facility]    Threatened: Not on file    Insulted: Not on file    Physically Hurt : Not on file    Scream: Not on file      Review of Systems  Constitutional:  Negative for  chills,  fatigue and unexpected weight change.  HENT:  Negative for congestion, rhinorrhea, sneezing and sore throat.   Eyes:  Negative for redness.  Respiratory: Negative.  Negative for cough, chest tightness, shortness of breath and wheezing.   Cardiovascular: Negative.  Negative for chest pain and palpitations.  Gastrointestinal: Negative.  Negative for abdominal pain, constipation, diarrhea, nausea and vomiting.  Genitourinary:  Negative for dysuria and frequency.  Musculoskeletal:  Positive for arthralgias (right wrist) and joint swelling. Negative for back pain and neck pain.  Skin:  Negative for rash.  Neurological:  Negative for dizziness, tremors, light-headedness and numbness.  Hematological:  Negative for adenopathy. Does not bruise/bleed easily.  Psychiatric/Behavioral:  Negative for behavioral problems (Depression), self-injury, sleep disturbance and suicidal ideas. The patient is nervous/anxious.     Vital Signs: BP 128/80   Pulse 78   Temp 98.6 F (37 C)   Resp 16   Ht 5\' 5"  (1.651 m)   Wt 186 lb (84.4 kg)   SpO2 98%   BMI 30.95 kg/m    Physical Exam Vitals reviewed.  Constitutional:      Appearance: Normal appearance. She is obese. She is not ill-appearing.  HENT:     Head: Normocephalic and atraumatic.  Eyes:     Pupils: Pupils are equal, round, and reactive to light.  Cardiovascular:     Rate and Rhythm: Normal rate and regular rhythm.  Pulmonary:     Effort: Pulmonary effort is normal. No respiratory distress.  Neurological:     Mental Status: She is alert and oriented to person, place, and time.  Psychiatric:        Mood and Affect: Mood normal.        Behavior: Behavior normal.        Assessment/Plan: 1. Acute pain of right wrist Referred to orthopedic surgeon - Ambulatory referral to Orthopedic Surgery  2. Pain of right thumb Referred to orthopedic surgeon.  - Ambulatory referral to Orthopedic Surgery  3. Fibromyalgia Continue amitriptyline as  prescribed.  - amitriptyline (ELAVIL) 25 MG tablet; Take 1 tablet (25 mg total) by mouth at bedtime.  Dispense: 90 tablet; Refill: 1  4. Mixed hyperlipidemia Continue fenofibrate as prescribed.  - fenofibrate (TRICOR) 145 MG tablet; Take 1 tablet (145 mg total) by mouth daily.  Dispense: 90 tablet; Refill: 1  5. Encounter for medication review Medication list reviewed, updated, and refills ordered  - gabapentin (NEURONTIN) 600 MG tablet; Take 1/2 (one-half) tablet by mouth daily  Dispense: 45 tablet; Refill: 3 - fluticasone (FLONASE) 50 MCG/ACT nasal spray; Place 2 sprays into both nostrils daily.  Dispense: 16 g; Refill: 3 - ezetimibe (ZETIA) 10 MG tablet; Take 1 tablet (10 mg total) by mouth daily.  Dispense: 90 tablet; Refill: 3 - mupirocin ointment (BACTROBAN) 2 %; Apply topically as needed (dermatitis).  Dispense: 22 g; Refill: 0  6. Generalized anxiety disorder Continue prn alprazolam as prescribed. 1 month sent to pharmacy, paper script signed and given to patient to take with her to [place] where she is going to be visiting until April 2025. Will follow up with her in April.  - ALPRAZolam Prudy Feeler) 0.5 MG tablet; Take 1 tablet (0.5 mg total) by mouth 3 (three) times daily as needed for anxiety. for anxiety  Dispense: 90 tablet; Refill: 2 - ALPRAZolam (XANAX) 0.5 MG tablet; Take 1 tablet (0.5 mg total) by mouth 3 (three) times daily as needed for anxiety or sleep.  Dispense: 90 tablet; Refill: 0  7. Sjogren's  syndrome, with unspecified organ involvement [facility]) Referred to dermatology - Ambulatory referral to Dermatology  8. Cracked lips Referred to dermatology - Ambulatory referral to Dermatology   General Counseling: antonique langford understanding of the findings of todays visit and agrees with plan of treatment. I have discussed any further diagnostic evaluation that may be needed or ordered today. We also reviewed her medications today. she has been encouraged to call the office  with any questions or concerns that should arise related to todays visit.    Orders Placed This Encounter  Procedures   Ambulatory referral to Orthopedic Surgery   Ambulatory referral to Dermatology    Meds ordered this encounter  Medications   ALPRAZolam (XANAX) 0.5 MG tablet    Sig: Take 1 tablet (0.5 mg total) by mouth 3 (three) times daily as needed for anxiety. for anxiety    Dispense:  90 tablet    Refill:  2    Refills, keep on file   gabapentin (NEURONTIN) 600 MG tablet    Sig: Take 1/2 (one-half) tablet by mouth daily    Dispense:  45 tablet    Refill:  3   amitriptyline (ELAVIL) 25 MG tablet    Sig: Take 1 tablet (25 mg total) by mouth at bedtime.    Dispense:  90 tablet    Refill:  1    This medication is scheduled to be started on 7/8. Please refrain from filling it until one week prior to avoid accumulating excess medication.   fluticasone (FLONASE) 50 MCG/ACT nasal spray    Sig: Place 2 sprays into both nostrils daily.    Dispense:  16 g    Refill:  3   fenofibrate (TRICOR) 145 MG tablet    Sig: Take 1 tablet (145 mg total) by mouth daily.    Dispense:  90 tablet    Refill:  1    For future refills, keep on file   ezetimibe (ZETIA) 10 MG tablet    Sig: Take 1 tablet (10 mg total) by mouth daily.    Dispense:  90 tablet    Refill:  3    For future refills   mupirocin ointment (BACTROBAN) 2 %    Sig: Apply topically as needed (dermatitis).    Dispense:  22 g    Refill:  0    Fill new script today.   ALPRAZolam (XANAX) 0.5 MG tablet    Sig: Take 1 tablet (0.5 mg total) by mouth 3 (three) times daily as needed for anxiety or sleep.    Dispense:  90 tablet    Refill:  0    Fill new script today    Return in about 5 months (around 03/02/2024) for F/U, anxiety med refill, Besan Ketchem PCP.   Total time spent:30 Minutes Time spent includes review of chart, medications, test results, and follow up plan with the patient.   [place] Controlled Substance Database was  reviewed by me.  This patient was seen by Sallyanne Kuster, FNP-C in collaboration with Dr. Beverely Risen as a part of collaborative care agreement.   Vonceil Upshur R. Tedd Sias, MSN, FNP-C Internal medicine

## 2023-09-22 ENCOUNTER — Encounter: Payer: Self-pay | Admitting: Obstetrics & Gynecology

## 2023-09-22 ENCOUNTER — Telehealth: Payer: Self-pay | Admitting: Nurse Practitioner

## 2023-09-22 DIAGNOSIS — G4733 Obstructive sleep apnea (adult) (pediatric): Secondary | ICD-10-CM | POA: Diagnosis not present

## 2023-09-22 LAB — HSV 1 AND 2 AB, IGG
HSV 1 Glycoprotein G Ab, IgG: REACTIVE — AB
HSV 2 IgG, Type Spec: NONREACTIVE

## 2023-09-22 NOTE — Telephone Encounter (Signed)
Orthopedic referral sent via [employer] to Dr. Stephenie Acres w/ [facility]. Notified patient. Gave telephone # [phone]

## 2023-09-26 NOTE — Progress Notes (Unsigned)
Virtual Visit via Video Note  I connected with Susan [name] on 09/30/23 at 11:30 AM EST by a video enabled telemedicine application and verified that I am speaking with the correct person using two identifiers.  Location: Patient: home Provider: office Persons participated in the visit- patient, provider    I discussed the limitations of evaluation and management by telemedicine and the availability of in person appointments. The patient expressed understanding and agreed to proceed.    I discussed the assessment and treatment plan with the patient. The patient was provided an opportunity to ask questions and all were answered. The patient agreed with the plan and demonstrated an understanding of the instructions.   The patient was advised to call back or seek an in-person evaluation if the symptoms worsen or if the condition fails to improve as anticipated.  I provided 25 minutes of non-face-to-face time during this encounter.   Neysa Hotter, MD    [facility] MD/PA/NP OP Progress Note  09/30/2023 11:57 AM Susan [name]  MRN:  [id number]  Chief Complaint:  Chief Complaint  Patient presents with   Follow-up   HPI:  This is a follow-up appointment for depression and anxiety.  She states that her friend is in the hospital due to cancer.  She has not woken up since biopsy yesterday.  She and this friend has been very close since she was 32s.  Their husband works in the police department.  They spend time together many times.  She states that this is 3 years in a row for her to lose her friends.  She thinks she is doing okay.  She may go on a cruise with her husband, niece and others.  She is planning to stay in [place], although she is unsure due to the condition of her friend.  The pain has been a little worse due to weather.  She has been reducing the dose of Soma.  She continues to take Xanax once to twice a day for anxiety/insomnia.  She denies feeling depressed.  She denies SI.  She  agrees with the plan as outlined below.   Substance use   Tobacco Alcohol Other substances/  Current denies denies denies  Past        Past Treatment           Visit Diagnosis:    ICD-10-CM   1. MDD (major depressive disorder), recurrent, in full remission (HCC)  F33.42     2. Generalized anxiety disorder  F41.1       Past Psychiatric History: Please see initial evaluation for full details. I have reviewed the history. No updates at this time.     Past Medical History:  Past Medical History:  Diagnosis Date   Arthritis    Asthma    Asthmatic bronchitis    BRCA negative 05/2014   MyRisk neg   Celiac disease    Celiac sprue    Chronic anxiety    Complication of anesthesia    vomiting   COPD (chronic obstructive pulmonary disease) (HCC)    Depression    Environmental allergies    Family history of breast cancer 05/2014   MyRisk neg; IBIS=29.5%   Fibromyalgia    neuropathy all over   Gastritis 10/08/2013   GERD (gastroesophageal reflux disease)    Hyperlipemia    Hyperlipidemia    Intolerant to statins   Hypothyroidism (acquired)    Increased risk of breast cancer 05/2014   IBIS=29.5%   Increased risk of  breast cancer 05/2014   IBIS=29.5%   Migraine    Migraines    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     Past Surgical History:  Procedure Laterality Date   ABDOMINAL HYSTERECTOMY  2006   BLADDER REPAIR     BREAST CYST ASPIRATION Left yrs ago   CHOLECYSTECTOMY  1989   COLONOSCOPY N/A 10/16/2021   Procedure: COLONOSCOPY;  Surgeon: Earline Mayotte, MD;  Location: [facility] ENDOSCOPY;  Service: Endoscopy;  Laterality: N/A;   COLONOSCOPY WITH ESOPHAGOGASTRODUODENOSCOPY (EGD)     COLONOSCOPY WITH PROPOFOL N/A 08/03/2017   Procedure: COLONOSCOPY WITH PROPOFOL;  Surgeon: Christena Deem, MD;  Location: [facility] ENDOSCOPY;   Service: Endoscopy;  Laterality: N/A;   COLONOSCOPY WITH PROPOFOL N/A 10/18/2018   Procedure: COLONOSCOPY WITH PROPOFOL;  Surgeon: Christena Deem, MD;  Location: [facility] ENDOSCOPY;  Service: Endoscopy;  Laterality: N/A;   ESOPHAGOGASTRODUODENOSCOPY N/A 10/07/2013   Procedure: ESOPHAGOGASTRODUODENOSCOPY (EGD);  Surgeon: West Bali, MD;  Location: [facility];  Service: Endoscopy;  Laterality: N/A;  patient received heparin at 530am given phenergan 25mg  IV 30 minutes before   ESOPHAGOGASTRODUODENOSCOPY     x4   EYE SURGERY     lacrimal gland    FLEXIBLE BRONCHOSCOPY N/A 06/04/2016   Procedure: FLEXIBLE BRONCHOSCOPY;  Surgeon: Yevonne Pax, MD;  Location: [facility] ORS;  Service: Pulmonary;  Laterality: N/A;   INCONTINENCE SURGERY  2004   NASAL SINUS SURGERY     THYROID LOBECTOMY     TOTAL THYROIDECTOMY      Family Psychiatric History: Please see initial evaluation for full details. I have reviewed the history. No updates at this time.     Family History:  Family History  Problem Relation Age of Onset   Arthritis/Rheumatoid Mother        died in age 2s   Breast cancer Mother 54   Breast cancer Maternal Aunt        X 2. 50's   Breast cancer Maternal Aunt    Lung cancer Maternal Aunt    Breast cancer Maternal Aunt    Breast cancer Maternal Grandmother        36's   Pancreatitis Neg Hx    Colon cancer Neg Hx     Social History:  Social History   Socioeconomic History   Marital status: Married    Spouse name: Not on file   Number of children: 0   Years of education: Not on file   Highest education level: Not on file  Occupational History   Not on file  Tobacco Use   Smoking status: Never    Passive exposure: Past   Smokeless tobacco: Never  Vaping Use   Vaping status: Never Used  Substance and Sexual Activity   Alcohol use: No   Drug use: No   Sexual activity: Not Currently    Birth control/protection: Surgical  Other Topics Concern   Not on file  Social  History Narrative   Not on file   Social Determinants of Health   Financial Resource Strain: Low Risk  (03/07/2021)   Overall Financial Resource Strain (CARDIA)    Difficulty of Paying Living Expenses: Not very hard  Food Insecurity: Not on file  Transportation Needs: Not on file  Physical Activity: Not on file  Stress: Not on file  Social Connections: Not  on file    Allergies:  Allergies  Allergen Reactions   Aspirin    Keflex [Cephalexin] Shortness Of Breath   Azithromycin Hives and Nausea Only   Barley Grass    Clarithromycin Other (See Comments)   Metoclopramide Other (See Comments)    TREMORS Tremors Tremors    Oat Other (See Comments)   Oatmeal Other (See Comments)   Other     Other reaction(s): Other (See Comments) KEOPLEX.   Rye Grass Flower Pollen Extract [Gramineae Pollens]    Vioxx [Rofecoxib] Diarrhea   Wheat Other (See Comments)    Celiac disease   Wheat Extract    Celebrex [Celecoxib] Hives   Metoclopramide Hcl Anxiety    BODY TREMORS    Metabolic Disorder Labs: No results found for: "HGBA1C", "MPG" No results found for: "PROLACTIN" Lab Results  Component Value Date   CHOL 152 06/30/2023   TRIG 104 06/30/2023   HDL 53 06/30/2023   CHOLHDL 2.9 06/30/2023   VLDL 22 03/23/2020   LDLCALC 80 06/30/2023   LDLCALC 114 (H) 04/16/2023   Lab Results  Component Value Date   TSH 0.802 06/30/2023   TSH 6.380 (H) 04/16/2023    Therapeutic Level Labs: No results found for: "LITHIUM" No results found for: "VALPROATE" No results found for: "CBMZ"  Current Medications: Current Outpatient Medications  Medication Sig Dispense Refill   acetaminophen (TYLENOL) 500 MG tablet Take by mouth as needed.     albuterol (PROVENTIL) (2.5 MG/3ML) 0.083% nebulizer solution Take 3 mLs (2.5 mg total) by nebulization every 6 (six) hours as needed for wheezing or shortness of breath. 75 mL 6   albuterol (VENTOLIN HFA) 108 (90 Base) MCG/ACT inhaler Inhale 2 puffs into  the lungs every 6 (six) hours as needed for wheezing or shortness of breath. 1 each 2   ALPRAZolam (XANAX) 0.5 MG tablet Take 1 tablet (0.5 mg total) by mouth 3 (three) times daily as needed for anxiety. for anxiety 90 tablet 2   ALPRAZolam (XANAX) 0.5 MG tablet Take 1 tablet (0.5 mg total) by mouth 3 (three) times daily as needed for anxiety or sleep. 90 tablet 0   amitriptyline (ELAVIL) 25 MG tablet Take 1 tablet (25 mg total) by mouth at bedtime. 90 tablet 1   Ascorbic Acid (VITAMIN C) 100 MG tablet Take 100 mg by mouth daily.      azelastine (ASTELIN) 0.1 % nasal spray Place 2 sprays into both nostrils 2 (two) times daily. Use in each nostril as directed 30 mL 4   carisoprodol (SOMA) 350 MG tablet Take 0.5 tablets (175 mg total) by mouth at bedtime. (Patient taking differently: Take 175 mg by mouth at bedtime as needed.) 45 tablet 2   cyanocobalamin (VITAMIN B12) 1000 MCG/ML injection INJECT INTRAMUSCULARLY EACH MONTH 1 mL 3   Dentifrices (BIOTENE DRY MOUTH DT) by Transmucosal route daily.     eletriptan (RELPAX) 40 MG tablet Take 40 mg by mouth as needed for migraine. may repeat in 2 hours if necessary      estradiol (CLIMARA) 0.05 mg/24hr patch APPLY 1 PATCH TO SKIN ONCE WEEKLY 12 patch 3   Estradiol (YUVAFEM) 10 MCG TABS vaginal tablet Place 1 tablet (10 mcg total) vaginally 2 (two) times a week. 24 tablet 1   ezetimibe (ZETIA) 10 MG tablet Take 1 tablet (10 mg total) by mouth daily. 90 tablet 3   fenofibrate (TRICOR) 145 MG tablet Take 1 tablet (145 mg total) by mouth daily. 90 tablet 1  fluticasone (FLONASE) 50 MCG/ACT nasal spray Place 2 sprays into both nostrils daily. 16 g 3   fluticasone furoate-vilanterol (BREO ELLIPTA) 100-25 MCG/ACT AEPB Inhale 1 puff into the lungs daily. 180 each 3   folic acid (FOLVITE) 1 MG tablet Take 1 tablet (1 mg total) by mouth daily. 90 tablet 1   gabapentin (NEURONTIN) 600 MG tablet Take 1/2 (one-half) tablet by mouth daily 45 tablet 3    HYDROcodone-acetaminophen (NORCO) 7.5-325 MG tablet One tab po tid for pain prn only 30 tablet 0   hydroxychloroquine (PLAQUENIL) 200 MG tablet Take 1 tablet (200 mg total) by mouth 2 (two) times daily. 180 tablet 0   hyoscyamine (ANASPAZ) 0.125 MG TBDP disintergrating tablet Place 0.125 mg under the tongue every 4 (four) hours as needed for bladder spasms.      Lactobacillus (DIGESTIVE HEALTH PROBIOTIC PO) Take 1 tablet by mouth daily.      levothyroxine (SYNTHROID) 137 MCG tablet Take 1 tablet (137 mcg total) by mouth daily before breakfast. 90 tablet 3   LINZESS 290 MCG CAPS capsule Take 1 capsule (290 mcg total) by mouth daily. 90 capsule 3   Magnesium Oxide (NATRUL MAGNESIUM PO) Take by mouth.      montelukast (SINGULAIR) 10 MG tablet TAKE 1 TABLET BY MOUTH ONCE DAILY FOR ASTHMA 90 tablet 1   Multiple Vitamins-Minerals (ZINC PO) Take by mouth daily.     mupirocin ointment (BACTROBAN) 2 % Apply topically as needed (dermatitis). 22 g 0   pantoprazole (PROTONIX) 40 MG tablet Take 40 mg by mouth daily.      Polyethyl Glycol-Propyl Glycol 0.4-0.3 % SOLN Apply to eye.      Probiotic Product (PHILLIPS COLON HEALTH) CAPS Take by mouth.      sodium chloride (OCEAN) 0.65 % SOLN nasal spray Place 1 spray into both nostrils as needed for congestion.      SUMAtriptan-naproxen (TREXIMET) 85-500 MG tablet daily as needed.      SYRINGE-NEEDLE, DISP, 3 ML (B-D 3CC LUER-LOK SYR 25GX5/8") 25G X 5/8" 3 ML MISC USE AS DIRECTED ONCE  A  MONTH 1 each 0   venlafaxine XR (EFFEXOR-XR) 150 MG 24 hr capsule Take 2 capsules (300 mg total) by mouth at bedtime. 180 capsule 0   Vitamin D, Ergocalciferol, (DRISDOL) 1.25 MG (50000 UNIT) CAPS capsule Take 1 capsule (50,000 Units total) by mouth every 7 (seven) days. Monday 12 capsule 1   vitamin E 1000 UNIT capsule Take by mouth.      No current facility-administered medications for this visit.     Musculoskeletal: Strength & Muscle Tone:  N/A Gait & Station:   N/A Patient leans: N/A  Psychiatric Specialty Exam: Review of Systems  Psychiatric/Behavioral:  Negative for agitation, behavioral problems, confusion, decreased concentration, dysphoric mood, hallucinations, self-injury, sleep disturbance and suicidal ideas. The patient is not nervous/anxious and is not hyperactive.   All other systems reviewed and are negative.   There were no vitals taken for this visit.There is no height or weight on file to calculate BMI.  General Appearance: Well Groomed  Eye Contact:  Good  Speech:  Clear and Coherent  Volume:  Normal  Mood:   good  Affect:  Appropriate, Congruent, and Full Range  Thought Process:  Coherent  Orientation:  Full (Time, Place, and Person)  Thought Content: Logical   Suicidal Thoughts:  No  Homicidal Thoughts:  No  Memory:  Immediate;   Good  Judgement:  Good  Insight:  Good  Psychomotor Activity:  Normal  Concentration:  Concentration: Good and Attention Span: Good  Recall:  Good  Fund of Knowledge: Good  Language: Good  Akathisia:  No  Handed:  Right  AIMS (if indicated): not done  Assets:  Communication Skills Desire for Improvement  ADL's:  Intact  Cognition: WNL  Sleep:  Good   Screenings: Mini-Mental    Flowsheet Row Office Visit from 04/08/2023 in [facility] Office Visit from 04/02/2022 in [facility], [facility] Clinical Support from 03/26/2021 in [facility], [facility] Clinical Support from 03/22/2020 in [facility], [facility] Clinical Support from 03/22/2019 in [facility], [facility]  Total Score (max 30 points ) 29 27 30 30 30       PHQ2-9    Flowsheet Row Office Visit from 04/08/2023 in [facility], [facility] Office Visit from 04/02/2022 in [facility], [facility] Office Visit from 03/03/2022 in [facility] Video Visit from 10/09/2021 in [facility] Office Visit from 09/24/2021  in [facility], [facility]  PHQ-2 Total Score 0 0 1 1 1       Flowsheet Row Admission (Discharged) from 10/16/2021 in [facility]  C-SSRS RISK CATEGORY No Risk        Assessment and Plan:  ARRIELLA NAULT is a 67 y.o. year old female with a history of depression, anxiety, Sjogren's syndrome on hydroxychloroquine, bilateral osteoarthritis, COPD, s/p thyroidectomy, hypothyroidism, OSA (on CPAP),  history of Celiac Disease, GERD,  , who presents for follow up appointment for below.   1. MDD (major depressive disorder), recurrent, in full remission (HCC) 2. Generalized anxiety disorder Acute stressors include: her close friend suffering from cancer  Other stressors include: loss of her close friends, sister in law, parents    History:  Tx from Dr. Omelia Blackwater. Anxiety worsened after she had a dental procedure when she was a child. Originally on venlafaxine 300 mg daily, amitriptyline 25 mg at night, xanax 0.5 mg BID  She denies depressive symptoms, although she continues to have self-limited anxiety, and takes as needed Xanax since the last visit.  Will continue venlafaxine to target depression and anxiety along with amitriptyline given she reports significant benefit not only for mood but also for fibromyalgia.  While it is preferable to try monotherapy, she could not tolerate discontinuation despite that she reportedly tried a few times in the past.  Although it has been discussed several times to limit the use of Xanax, this has been prescribed by another provider.     2. Cognitive decline Unchanged. Although OT referral was sent for evaluation given her reported memory loss and occasional issues with executive functioning, she declined this referral when she was contacted. Noted that although she reports good benefit from Ritalin in the past, it was reportedly discontinued due to adverse reaction.  Discussed risk of gabapentin and Xanax on cognition.     Plan Continue venlafaxine 300 mg daily  Continue amitriptyline 25 mg at night (anticholinergic side effect from higher dose) HR 74, qtc 446 msec 09/2022 Next appointment: 12/4 at 11 30 for 30 mins, video - on carisoprodol (Soma), gabapentin 300 mg BID - on xanax 0.5 mg  TID, prescribed by Dr. Gerilyn Pilgrim - TSH, vitamin B12/D, folate checked 06/2023- wnl except slightlly low vitamin D   Past trials of medication: sertraline, Paxil, venlafaxine, Xanax, Ritalin (worked very well)    The patient demonstrates the following risk factors for suicide: Chronic risk factors for  suicide include: psychiatric disorder of anxiety. Acute risk factors for suicide include: N/A. Protective factors for this patient include: positive social support, responsibility to others (children, family), coping skills and hope for the future. Considering these factors, the overall suicide risk at this point appears to be low. She owns a gun. Patient is appropriate for outpatient follow up.  Collaboration of Care: Collaboration of Care: Other reviewed notes in Epic  Patient/Guardian was advised Release of Information must be obtained prior to any record release in order to collaborate their care with an outside provider. Patient/Guardian was advised if they have not already done so to contact the registration department to sign all necessary forms in order for [place] to release information regarding their care.   Consent: Patient/Guardian gives verbal consent for treatment and assignment of benefits for services provided during this visit. Patient/Guardian expressed understanding and agreed to proceed.    Neysa Hotter, MD 09/30/2023, 11:57 AM

## 2023-09-28 ENCOUNTER — Telehealth: Payer: Self-pay

## 2023-09-28 NOTE — Telephone Encounter (Signed)
Left message for patient to give office a call back

## 2023-09-29 DIAGNOSIS — M3500 Sicca syndrome, unspecified: Secondary | ICD-10-CM | POA: Diagnosis not present

## 2023-09-29 DIAGNOSIS — Z79899 Other long term (current) drug therapy: Secondary | ICD-10-CM | POA: Diagnosis not present

## 2023-09-29 DIAGNOSIS — Z860100 Personal history of colon polyps, unspecified: Secondary | ICD-10-CM | POA: Diagnosis not present

## 2023-09-29 DIAGNOSIS — H2513 Age-related nuclear cataract, bilateral: Secondary | ICD-10-CM | POA: Diagnosis not present

## 2023-09-29 DIAGNOSIS — K581 Irritable bowel syndrome with constipation: Secondary | ICD-10-CM | POA: Diagnosis not present

## 2023-09-29 DIAGNOSIS — K219 Gastro-esophageal reflux disease without esophagitis: Secondary | ICD-10-CM | POA: Diagnosis not present

## 2023-09-29 LAB — CYTOLOGY - PAP
Comment: NEGATIVE
Diagnosis: UNDETERMINED — AB
High risk HPV: NEGATIVE

## 2023-09-30 ENCOUNTER — Telehealth: Payer: Self-pay | Admitting: Nurse Practitioner

## 2023-09-30 ENCOUNTER — Telehealth (INDEPENDENT_AMBULATORY_CARE_PROVIDER_SITE_OTHER): Payer: Medicare HMO | Admitting: Psychiatry

## 2023-09-30 ENCOUNTER — Encounter: Payer: Self-pay | Admitting: Psychiatry

## 2023-09-30 DIAGNOSIS — F411 Generalized anxiety disorder: Secondary | ICD-10-CM | POA: Diagnosis not present

## 2023-09-30 DIAGNOSIS — F3342 Major depressive disorder, recurrent, in full remission: Secondary | ICD-10-CM | POA: Diagnosis not present

## 2023-09-30 DIAGNOSIS — R4181 Age-related cognitive decline: Secondary | ICD-10-CM | POA: Diagnosis not present

## 2023-09-30 MED ORDER — VENLAFAXINE HCL ER 150 MG PO CP24: 300.0000 mg | ORAL_CAPSULE | Freq: Every day | ORAL | 1 refills | Status: DC

## 2023-09-30 NOTE — Telephone Encounter (Signed)
Per Marquis Buggy, referral has been closed due to no return call from patient -Sheralyn Boatman

## 2023-10-06 ENCOUNTER — Ambulatory Visit
Admission: RE | Admit: 2023-10-06 | Discharge: 2023-10-06 | Disposition: A | Discharge status: Home / Self Care | Payer: Medicare HMO | Source: Ambulatory Visit

## 2023-10-06 DIAGNOSIS — Z1231 Encounter for screening mammogram for malignant neoplasm of breast: Secondary | ICD-10-CM | POA: Insufficient documentation

## 2023-10-08 ENCOUNTER — Other Ambulatory Visit: Payer: Self-pay

## 2023-10-08 DIAGNOSIS — R928 Other abnormal and inconclusive findings on diagnostic imaging of breast: Secondary | ICD-10-CM

## 2023-10-09 NOTE — Addendum Note (Signed)
Addended by: Sallyanne Kuster on: 10/09/2023 08:19 AM   Modules accepted: Orders

## 2023-10-13 ENCOUNTER — Telehealth: Payer: Self-pay

## 2023-10-15 ENCOUNTER — Encounter: Payer: Self-pay | Admitting: Obstetrics & Gynecology

## 2023-10-15 ENCOUNTER — Other Ambulatory Visit: Payer: Self-pay | Admitting: Obstetrics & Gynecology

## 2023-10-15 DIAGNOSIS — Z8619 Personal history of other infectious and parasitic diseases: Secondary | ICD-10-CM

## 2023-10-15 MED ORDER — VALACYCLOVIR HCL 1 G PO TABS: ORAL_TABLET | ORAL | 5 refills | Status: AC

## 2023-10-15 NOTE — Telephone Encounter (Signed)
Called pt, no answer, LVMTRC. 

## 2023-10-15 NOTE — Progress Notes (Signed)
Valtrex for cold sore treatment prescribed per patient request.

## 2023-10-15 NOTE — Telephone Encounter (Signed)
Psl reach out to pt and find out what facility she wants referral to in [facility]. She will then need to sign med release so images from [facility] can be sent to new location in [facility] for their comparison.

## 2023-10-16 NOTE — Telephone Encounter (Signed)
Tried again since it's later in the day, did not leave a voice msg this time.

## 2023-10-16 NOTE — Telephone Encounter (Signed)
Called pt, no answer, LVMTRC. 

## 2023-11-04 ENCOUNTER — Other Ambulatory Visit: Payer: Self-pay

## 2023-11-04 ENCOUNTER — Telehealth: Payer: Self-pay | Admitting: Nurse Practitioner

## 2023-11-04 MED ORDER — PREDNISONE 10 MG PO TABS: ORAL_TABLET | ORAL | 0 refills | Status: DC

## 2023-11-04 NOTE — Telephone Encounter (Signed)
 As per dr Welton Flakes sent prednisone and advised pt if worse go to urgent care

## 2023-11-05 ENCOUNTER — Ambulatory Visit
Admission: RE | Admit: 2023-11-05 | Discharge: 2023-11-05 | Disposition: A | Discharge status: Home / Self Care | Payer: Medicare HMO | Source: Ambulatory Visit

## 2023-11-05 ENCOUNTER — Ambulatory Visit: Payer: Medicare HMO | Admitting: Obstetrics & Gynecology

## 2023-11-05 VITALS — BP 94/66 | HR 109 | Wt 184.4 lb

## 2023-11-05 DIAGNOSIS — N6320 Unspecified lump in the left breast, unspecified quadrant: Secondary | ICD-10-CM | POA: Diagnosis not present

## 2023-11-05 DIAGNOSIS — R87620 Atypical squamous cells of undetermined significance on cytologic smear of vagina (ASC-US): Secondary | ICD-10-CM | POA: Diagnosis not present

## 2023-11-05 DIAGNOSIS — R92322 Mammographic fibroglandular density, left breast: Secondary | ICD-10-CM | POA: Diagnosis not present

## 2023-11-05 DIAGNOSIS — R928 Other abnormal and inconclusive findings on diagnostic imaging of breast: Secondary | ICD-10-CM | POA: Insufficient documentation

## 2023-11-05 NOTE — Progress Notes (Signed)
    GYNECOLOGY PROGRESS NOTE  Subjective:    Patient ID: Susan [name], female    DOB: 1956/03/15, 68 y.o.   MRN: [id number]  HPI  Patient is a 68 y.o. married G0P0000 here for a colpo. She has had ASCUS negative HR HPV paps of her vaginal cuff. She did have LGSIL on her pap of cuff in 2018 and had a colpo at that time. A biopsy done at that time was normal.  She has severe vaginal atrophy and has not had vaginal sex for more than a year.   The following portions of the patient's history were reviewed and updated as appropriate: allergies, current medications, past family history, past medical history, past social history, past surgical history, and problem list.  Review of Systems Pertinent items are noted in HPI.  She lives in [place]  during the winters. She has been married for 50 years.  Objective:   Blood pressure 94/66, pulse (!) 109, weight 184 lb 6.4 oz (83.6 kg). Body mass index is 30.69 kg/m. Well nourished, well hydrated White female, no apparent distress She is ambulating and conversing normally. Consent signed, time out done Speculum placed. Narrow vagina and cuff soaked with acetic acid for several minutes.  Colposcopic findings normal.  She tolerated the procedure well.     Assessment:   1. Atypical squamous cell changes of undetermined significance (ASCUS) on vaginal cytology    - normal colpo  Plan:   Rec repeat pap in a year.

## 2023-11-06 ENCOUNTER — Other Ambulatory Visit: Payer: Self-pay

## 2023-11-06 DIAGNOSIS — R921 Mammographic calcification found on diagnostic imaging of breast: Secondary | ICD-10-CM

## 2023-11-06 DIAGNOSIS — R928 Other abnormal and inconclusive findings on diagnostic imaging of breast: Secondary | ICD-10-CM

## 2023-11-07 ENCOUNTER — Other Ambulatory Visit: Payer: Self-pay | Admitting: Nurse Practitioner

## 2023-11-07 DIAGNOSIS — Z760 Encounter for issue of repeat prescription: Secondary | ICD-10-CM

## 2023-11-08 ENCOUNTER — Other Ambulatory Visit: Payer: Self-pay | Admitting: Obstetrics and Gynecology

## 2023-11-12 ENCOUNTER — Telehealth: Payer: Self-pay

## 2023-11-12 NOTE — Telephone Encounter (Signed)
Patient called she would like an order to have a breast biopsy done sent to:  [facility] [address] [place], [place] [address]  Phone#: [phone] Fax#: [phone]  Please advise.

## 2023-11-13 NOTE — Telephone Encounter (Signed)
Susan [name], Can you pls call [facility] in [place], [place] and ask if they will do breast bx of cat 4 mammo for pt. Explain she had done here but winters in [facility] and needs bx. If not, do they have any recommendations for breast bx? Thank you.

## 2023-11-16 ENCOUNTER — Telehealth: Payer: Self-pay

## 2023-11-16 DIAGNOSIS — F411 Generalized anxiety disorder: Secondary | ICD-10-CM

## 2023-11-16 NOTE — Telephone Encounter (Signed)
Spoke to a pharmacist from [place] in [place] and patient was trying to get her Xanax filled there. They are unable to do a hard copy so I spoke with Alyssa and she stated she is unable to do electronic and that maybe she needs to find a doctor down there in the mean time. Patient was notified and understood.

## 2023-11-17 DIAGNOSIS — K130 Diseases of lips: Secondary | ICD-10-CM | POA: Diagnosis not present

## 2023-11-17 MED ORDER — ALPRAZOLAM 0.5 MG PO TABS: 0.5000 mg | ORAL_TABLET | Freq: Three times a day (TID) | ORAL | 0 refills | Status: DC | PRN

## 2023-11-17 NOTE — Telephone Encounter (Signed)
Pt.notified

## 2023-11-17 NOTE — Telephone Encounter (Signed)
That sounds great. Thank you for doing that.

## 2023-11-25 ENCOUNTER — Other Ambulatory Visit: Payer: Self-pay | Admitting: Obstetrics and Gynecology

## 2023-11-25 DIAGNOSIS — Z780 Asymptomatic menopausal state: Secondary | ICD-10-CM

## 2023-11-25 DIAGNOSIS — N952 Postmenopausal atrophic vaginitis: Secondary | ICD-10-CM

## 2023-11-25 DIAGNOSIS — N939 Abnormal uterine and vaginal bleeding, unspecified: Secondary | ICD-10-CM

## 2023-11-25 DIAGNOSIS — Z79890 Hormone replacement therapy (postmenopausal): Secondary | ICD-10-CM

## 2023-12-01 DIAGNOSIS — Z85850 Personal history of malignant neoplasm of thyroid: Secondary | ICD-10-CM | POA: Diagnosis not present

## 2023-12-01 DIAGNOSIS — K219 Gastro-esophageal reflux disease without esophagitis: Secondary | ICD-10-CM | POA: Diagnosis not present

## 2023-12-01 DIAGNOSIS — J449 Chronic obstructive pulmonary disease, unspecified: Secondary | ICD-10-CM | POA: Diagnosis not present

## 2023-12-01 DIAGNOSIS — M1990 Unspecified osteoarthritis, unspecified site: Secondary | ICD-10-CM | POA: Diagnosis not present

## 2023-12-01 DIAGNOSIS — K529 Noninfective gastroenteritis and colitis, unspecified: Secondary | ICD-10-CM | POA: Diagnosis not present

## 2023-12-01 DIAGNOSIS — K900 Celiac disease: Secondary | ICD-10-CM | POA: Diagnosis not present

## 2023-12-01 DIAGNOSIS — Z803 Family history of malignant neoplasm of breast: Secondary | ICD-10-CM | POA: Diagnosis not present

## 2023-12-01 DIAGNOSIS — J4489 Other specified chronic obstructive pulmonary disease: Secondary | ICD-10-CM | POA: Diagnosis not present

## 2023-12-01 DIAGNOSIS — K5900 Constipation, unspecified: Secondary | ICD-10-CM | POA: Diagnosis not present

## 2023-12-01 DIAGNOSIS — Z8669 Personal history of other diseases of the nervous system and sense organs: Secondary | ICD-10-CM | POA: Diagnosis not present

## 2023-12-01 DIAGNOSIS — R1084 Generalized abdominal pain: Secondary | ICD-10-CM | POA: Diagnosis not present

## 2023-12-01 DIAGNOSIS — Z8719 Personal history of other diseases of the digestive system: Secondary | ICD-10-CM | POA: Diagnosis not present

## 2023-12-01 DIAGNOSIS — G629 Polyneuropathy, unspecified: Secondary | ICD-10-CM | POA: Diagnosis not present

## 2023-12-01 DIAGNOSIS — F419 Anxiety disorder, unspecified: Secondary | ICD-10-CM | POA: Diagnosis not present

## 2023-12-01 DIAGNOSIS — Z90710 Acquired absence of both cervix and uterus: Secondary | ICD-10-CM | POA: Diagnosis not present

## 2023-12-01 DIAGNOSIS — Z9049 Acquired absence of other specified parts of digestive tract: Secondary | ICD-10-CM | POA: Diagnosis not present

## 2023-12-01 DIAGNOSIS — M797 Fibromyalgia: Secondary | ICD-10-CM | POA: Diagnosis not present

## 2023-12-01 DIAGNOSIS — K654 Sclerosing mesenteritis: Secondary | ICD-10-CM | POA: Diagnosis not present

## 2023-12-01 DIAGNOSIS — J45909 Unspecified asthma, uncomplicated: Secondary | ICD-10-CM | POA: Diagnosis not present

## 2023-12-01 DIAGNOSIS — E785 Hyperlipidemia, unspecified: Secondary | ICD-10-CM | POA: Diagnosis not present

## 2023-12-01 DIAGNOSIS — F32A Depression, unspecified: Secondary | ICD-10-CM | POA: Diagnosis not present

## 2023-12-01 DIAGNOSIS — M3500 Sicca syndrome, unspecified: Secondary | ICD-10-CM | POA: Diagnosis not present

## 2023-12-01 DIAGNOSIS — E890 Postprocedural hypothyroidism: Secondary | ICD-10-CM | POA: Diagnosis not present

## 2023-12-09 DIAGNOSIS — K900 Celiac disease: Secondary | ICD-10-CM | POA: Diagnosis not present

## 2023-12-09 DIAGNOSIS — M3500 Sicca syndrome, unspecified: Secondary | ICD-10-CM | POA: Diagnosis not present

## 2023-12-09 DIAGNOSIS — K581 Irritable bowel syndrome with constipation: Secondary | ICD-10-CM | POA: Diagnosis not present

## 2023-12-10 ENCOUNTER — Telehealth: Payer: Self-pay | Admitting: Nurse Practitioner

## 2023-12-10 NOTE — Telephone Encounter (Signed)
Per request from Selena Batten w/ [facility]. Last pcp & pulm office notes faxed: [phone]

## 2023-12-12 DIAGNOSIS — K900 Celiac disease: Secondary | ICD-10-CM | POA: Diagnosis not present

## 2023-12-15 DIAGNOSIS — K635 Polyp of colon: Secondary | ICD-10-CM | POA: Diagnosis not present

## 2023-12-15 DIAGNOSIS — K2950 Unspecified chronic gastritis without bleeding: Secondary | ICD-10-CM | POA: Diagnosis not present

## 2023-12-15 DIAGNOSIS — K581 Irritable bowel syndrome with constipation: Secondary | ICD-10-CM | POA: Diagnosis not present

## 2023-12-15 DIAGNOSIS — Z1211 Encounter for screening for malignant neoplasm of colon: Secondary | ICD-10-CM | POA: Diagnosis not present

## 2023-12-15 DIAGNOSIS — K640 First degree hemorrhoids: Secondary | ICD-10-CM | POA: Diagnosis not present

## 2023-12-15 DIAGNOSIS — R140 Abdominal distension (gaseous): Secondary | ICD-10-CM | POA: Diagnosis not present

## 2023-12-15 DIAGNOSIS — D124 Benign neoplasm of descending colon: Secondary | ICD-10-CM | POA: Diagnosis not present

## 2023-12-15 DIAGNOSIS — D122 Benign neoplasm of ascending colon: Secondary | ICD-10-CM | POA: Diagnosis not present

## 2023-12-15 DIAGNOSIS — Z9049 Acquired absence of other specified parts of digestive tract: Secondary | ICD-10-CM | POA: Diagnosis not present

## 2023-12-15 DIAGNOSIS — M3500 Sicca syndrome, unspecified: Secondary | ICD-10-CM | POA: Diagnosis not present

## 2023-12-21 DIAGNOSIS — L308 Other specified dermatitis: Secondary | ICD-10-CM | POA: Diagnosis not present

## 2023-12-21 DIAGNOSIS — D492 Neoplasm of unspecified behavior of bone, soft tissue, and skin: Secondary | ICD-10-CM | POA: Diagnosis not present

## 2023-12-23 ENCOUNTER — Telehealth: Payer: Self-pay

## 2023-12-25 ENCOUNTER — Other Ambulatory Visit: Payer: Self-pay | Admitting: Nurse Practitioner

## 2023-12-25 DIAGNOSIS — F411 Generalized anxiety disorder: Secondary | ICD-10-CM

## 2023-12-25 MED ORDER — ALPRAZOLAM 0.5 MG PO TABS: 0.5000 mg | ORAL_TABLET | Freq: Three times a day (TID) | ORAL | 2 refills | Status: DC | PRN

## 2023-12-25 NOTE — Telephone Encounter (Signed)
 done

## 2023-12-27 ENCOUNTER — Other Ambulatory Visit: Payer: Self-pay | Admitting: Nurse Practitioner

## 2023-12-27 DIAGNOSIS — J301 Allergic rhinitis due to pollen: Secondary | ICD-10-CM

## 2023-12-28 NOTE — Telephone Encounter (Signed)
 Pt advised we sent med

## 2023-12-30 DIAGNOSIS — J4540 Moderate persistent asthma, uncomplicated: Secondary | ICD-10-CM | POA: Diagnosis not present

## 2023-12-30 DIAGNOSIS — M3500 Sicca syndrome, unspecified: Secondary | ICD-10-CM | POA: Diagnosis not present

## 2023-12-30 DIAGNOSIS — M797 Fibromyalgia: Secondary | ICD-10-CM | POA: Diagnosis not present

## 2023-12-30 DIAGNOSIS — K900 Celiac disease: Secondary | ICD-10-CM | POA: Diagnosis not present

## 2023-12-30 DIAGNOSIS — F331 Major depressive disorder, recurrent, moderate: Secondary | ICD-10-CM | POA: Diagnosis not present

## 2023-12-30 DIAGNOSIS — G43009 Migraine without aura, not intractable, without status migrainosus: Secondary | ICD-10-CM | POA: Diagnosis not present

## 2023-12-30 DIAGNOSIS — N6019 Diffuse cystic mastopathy of unspecified breast: Secondary | ICD-10-CM | POA: Diagnosis not present

## 2023-12-30 DIAGNOSIS — F411 Generalized anxiety disorder: Secondary | ICD-10-CM | POA: Diagnosis not present

## 2023-12-30 DIAGNOSIS — Z85850 Personal history of malignant neoplasm of thyroid: Secondary | ICD-10-CM | POA: Diagnosis not present

## 2023-12-30 DIAGNOSIS — E782 Mixed hyperlipidemia: Secondary | ICD-10-CM | POA: Diagnosis not present

## 2024-01-05 DIAGNOSIS — R1030 Lower abdominal pain, unspecified: Secondary | ICD-10-CM | POA: Diagnosis not present

## 2024-01-05 DIAGNOSIS — R194 Change in bowel habit: Secondary | ICD-10-CM | POA: Insufficient documentation

## 2024-01-06 DIAGNOSIS — N951 Menopausal and female climacteric states: Secondary | ICD-10-CM | POA: Diagnosis not present

## 2024-01-06 DIAGNOSIS — E782 Mixed hyperlipidemia: Secondary | ICD-10-CM | POA: Diagnosis not present

## 2024-01-06 DIAGNOSIS — E890 Postprocedural hypothyroidism: Secondary | ICD-10-CM | POA: Diagnosis not present

## 2024-01-06 DIAGNOSIS — R739 Hyperglycemia, unspecified: Secondary | ICD-10-CM | POA: Diagnosis not present

## 2024-01-06 DIAGNOSIS — D519 Vitamin B12 deficiency anemia, unspecified: Secondary | ICD-10-CM | POA: Diagnosis not present

## 2024-01-14 DIAGNOSIS — G4733 Obstructive sleep apnea (adult) (pediatric): Secondary | ICD-10-CM | POA: Diagnosis not present

## 2024-01-20 ENCOUNTER — Other Ambulatory Visit: Payer: Self-pay | Admitting: Rheumatology

## 2024-01-20 DIAGNOSIS — M3509 Sicca syndrome with other organ involvement: Secondary | ICD-10-CM

## 2024-01-20 NOTE — Telephone Encounter (Signed)
 Last Fill: 09/09/2023  Eye exam: 09/29/2023 WNL   Labs: 12/01/2023 RBC 4.19, Hct 37.6, Eosinophils Absolute 0.29  Next Visit: 03/07/2024  Last Visit: 09/09/2023  WG:[id number] syndrome with other organ involvement   Current Dose per office note 09/09/2023: Plaquenil 200 mg 1 tablet by mouth twice daily   Okay to refill Plaquenil?

## 2024-01-30 ENCOUNTER — Other Ambulatory Visit: Payer: Self-pay | Admitting: Nurse Practitioner

## 2024-01-30 DIAGNOSIS — Z760 Encounter for issue of repeat prescription: Secondary | ICD-10-CM

## 2024-02-02 DIAGNOSIS — Z1331 Encounter for screening for depression: Secondary | ICD-10-CM | POA: Diagnosis not present

## 2024-02-02 DIAGNOSIS — Z6831 Body mass index (BMI) 31.0-31.9, adult: Secondary | ICD-10-CM | POA: Diagnosis not present

## 2024-02-02 DIAGNOSIS — E538 Deficiency of other specified B group vitamins: Secondary | ICD-10-CM | POA: Diagnosis not present

## 2024-02-02 DIAGNOSIS — F331 Major depressive disorder, recurrent, moderate: Secondary | ICD-10-CM | POA: Diagnosis not present

## 2024-02-02 DIAGNOSIS — G43009 Migraine without aura, not intractable, without status migrainosus: Secondary | ICD-10-CM | POA: Diagnosis not present

## 2024-02-02 DIAGNOSIS — N6019 Diffuse cystic mastopathy of unspecified breast: Secondary | ICD-10-CM | POA: Diagnosis not present

## 2024-02-02 DIAGNOSIS — M3500 Sicca syndrome, unspecified: Secondary | ICD-10-CM | POA: Diagnosis not present

## 2024-02-02 DIAGNOSIS — B009 Herpesviral infection, unspecified: Secondary | ICD-10-CM | POA: Diagnosis not present

## 2024-02-02 DIAGNOSIS — E890 Postprocedural hypothyroidism: Secondary | ICD-10-CM | POA: Diagnosis not present

## 2024-02-02 DIAGNOSIS — Z85850 Personal history of malignant neoplasm of thyroid: Secondary | ICD-10-CM | POA: Diagnosis not present

## 2024-02-02 DIAGNOSIS — K900 Celiac disease: Secondary | ICD-10-CM | POA: Diagnosis not present

## 2024-02-02 DIAGNOSIS — K760 Fatty (change of) liver, not elsewhere classified: Secondary | ICD-10-CM | POA: Diagnosis not present

## 2024-02-02 DIAGNOSIS — E782 Mixed hyperlipidemia: Secondary | ICD-10-CM | POA: Diagnosis not present

## 2024-02-02 DIAGNOSIS — J4540 Moderate persistent asthma, uncomplicated: Secondary | ICD-10-CM | POA: Diagnosis not present

## 2024-02-02 DIAGNOSIS — Z0001 Encounter for general adult medical examination with abnormal findings: Secondary | ICD-10-CM | POA: Diagnosis not present

## 2024-02-02 DIAGNOSIS — E669 Obesity, unspecified: Secondary | ICD-10-CM | POA: Diagnosis not present

## 2024-02-02 DIAGNOSIS — F411 Generalized anxiety disorder: Secondary | ICD-10-CM | POA: Diagnosis not present

## 2024-02-02 DIAGNOSIS — M797 Fibromyalgia: Secondary | ICD-10-CM | POA: Diagnosis not present

## 2024-02-11 DIAGNOSIS — L309 Dermatitis, unspecified: Secondary | ICD-10-CM | POA: Diagnosis not present

## 2024-02-12 ENCOUNTER — Other Ambulatory Visit: Payer: Self-pay | Admitting: Psychiatry

## 2024-02-12 DIAGNOSIS — F411 Generalized anxiety disorder: Secondary | ICD-10-CM

## 2024-02-12 DIAGNOSIS — F3342 Major depressive disorder, recurrent, in full remission: Secondary | ICD-10-CM

## 2024-02-22 NOTE — Progress Notes (Signed)
 Office Visit Note  Patient: Susan [name]             Date of Birth: 26-Oct-1956           MRN: [id number]             PCP: Laurence Pons, NP Referring: Laurence Pons, NP Visit Date: 03/07/2024 Occupation: @GUAROCC @  Subjective:  Pain in multiple joints   History of Present Illness: RUSSELL TEANEY is a 68 y.o. female with history of sjogren's syndrome and osteoarthritis.  Patient remains on plaquenil  200 mg 1 tablet by mouth twice daily.  She is tolerating Plaquenil  without any side effects and has not had any gaps in therapy.  Patient presents today experiencing increased pain and stiffness involving multiple joints.   Her pain has been most severe involving both hands and the right ankle joint.  She has been wearing braces on both hands due to the severity of pain.  She has been taking hydrocodone  sparingly for severe pain relief.  She continues to have chronic pain in her lower back and has difficulty standing for prolonged periods of time.  She is been having to sleep in a recliner due to the discomfort in her shoulders, hips, and lower back.  She has been taking tylenol  PRN with minimal relief.  Patient states that the severity of joint pain has been worsening over the past 6 months. Patient states that she has been experiencing increased fatigue and was found to be vitamin B12 deficient.  She started vitamin B12 injections last month.  She continues to have chronic sicca symptoms and has been using over-the-counter products for symptomatic relief.  She sees a Education officer, community and ophthalmologist every 6 months. She states that she has also noticed a rash on bilateral lower legs after standing or walking for prolonged periods of time.     Activities of Daily Living:  Patient reports morning stiffness for 30 minutes.   Patient Reports nocturnal pain.  Difficulty dressing/grooming: Denies Difficulty climbing stairs: Reports Difficulty getting out of chair: Denies Difficulty using hands  for taps, buttons, cutlery, and/or writing: Reports  Review of Systems  Constitutional:  Positive for fatigue.  HENT:  Positive for mouth sores and mouth dryness.   Eyes:  Positive for dryness.  Respiratory:  Positive for shortness of breath.   Cardiovascular:  Positive for palpitations. Negative for chest pain.  Gastrointestinal:  Positive for constipation and diarrhea. Negative for blood in stool.  Endocrine: Negative for increased urination.  Genitourinary:  Negative for involuntary urination.  Musculoskeletal:  Positive for joint pain, gait problem, joint pain, joint swelling, myalgias, muscle weakness, morning stiffness, muscle tenderness and myalgias.  Skin:  Positive for rash and sensitivity to sunlight. Negative for color change and hair loss.  Allergic/Immunologic: Negative for susceptible to infections.  Neurological:  Positive for headaches. Negative for dizziness.  Hematological:  Positive for swollen glands.  Psychiatric/Behavioral:  Positive for sleep disturbance. Negative for depressed mood. The patient is nervous/anxious.     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   Essential tremor 06/20/2020   OSA (obstructive sleep apnea) 03/26/2020   Chronic wrist pain, right 11/02/2019   Chronic pain  of left wrist 11/02/2019   Pain in joints of right hand 11/02/2019   Pain in joint of left hand 11/02/2019   Primary osteoarthritis of both hands 11/02/2019   MDD (major depressive disorder), recurrent, 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/05/2019   Dysuria 04/03/2019   Generalized anxiety disorder 02/28/2019   Chronic pain of both  knees 03/01/2018   Encounter for general adult medical examination with abnormal findings 03/01/2018   Primary osteoarthritis of both first carpometacarpal joints 03/01/2018   Chronic obstructive pulmonary disease, unspecified (HCC) 12/04/2017   Shortness of breath 12/04/2017   Allergic rhinitis, unspecified 12/04/2017   Low grade squamous intraepith lesion on cytologic smear vagina (lgsil) 09/09/2017   Vaginal atrophy 09/01/2017   Chronic right-sided low back pain without sciatica 01/05/2017   Primary Sjogren's syndrome (HCC) 01/05/2017   CD (celiac disease) 04/17/2015   Gastroesophageal reflux disease with esophagitis 04/17/2015   Sjogren's syndrome (HCC) 04/17/2015   Acute asthma exacerbation 10/14/2013   Acute respiratory failure (HCC) 10/14/2013   Gastritis 10/08/2013   Bradycardia 10/07/2013   Esophageal spasm 10/06/2013   Substernal precordial chest pain 10/06/2013   Chronic anxiety 10/06/2013   GERD (gastroesophageal reflux disease) 10/06/2013   Chronic asthma 10/06/2013   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   Depression with anxiety 10/06/2013   Fibromyalgia 10/06/2013   Hypothyroidism 10/06/2013   Chronic migraine 10/06/2013    Past Medical History:  Diagnosis Date   Arthritis    Asthma    Asthmatic bronchitis    BRCA negative 05/2014   MyRisk neg   Celiac disease    Celiac sprue    Chronic anxiety    Complication of anesthesia    vomiting   COPD (chronic obstructive pulmonary disease) (HCC)    Depression    Environmental allergies    Family history of breast cancer 05/2014   MyRisk neg; IBIS=29.5%   Fibromyalgia    neuropathy all over   Gastritis 10/08/2013   GERD (gastroesophageal reflux disease)    Hyperlipemia    Hyperlipidemia    Intolerant to statins   Hypothyroidism (acquired)    Increased risk of breast cancer 05/2014   IBIS=29.5%   Increased risk of breast cancer 05/2014   IBIS=29.5%   Migraine    Migraines    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     Family History  Problem Relation Age of Onset   Arthritis/Rheumatoid Mother        died in age 61s   Breast cancer Mother 84   Breast cancer Maternal Aunt        X 2. 50's   Breast cancer Maternal Aunt    Lung cancer Maternal Aunt    Breast cancer Maternal Aunt    Breast cancer Maternal Grandmother        22's   Pancreatitis Neg Hx    Colon cancer Neg Hx    Past Surgical History:  Procedure Laterality Date   ABDOMINAL HYSTERECTOMY  2006   BLADDER REPAIR     BREAST BIOPSY Left 02/24/2024   BREAST CYST ASPIRATION Left yrs ago   CHOLECYSTECTOMY  1989   COLONOSCOPY N/A 10/16/2021   Procedure: COLONOSCOPY;  Surgeon: Marshall Skeeter, MD;  Location: [facility] ENDOSCOPY;  Service: Endoscopy;  Laterality: N/A;   COLONOSCOPY WITH ESOPHAGOGASTRODUODENOSCOPY (EGD)     COLONOSCOPY WITH PROPOFOL  N/A 08/03/2017  Procedure: COLONOSCOPY WITH PROPOFOL ;  Surgeon: Deveron Fly, MD;  Location: [facility] ENDOSCOPY;  Service: Endoscopy;  Laterality: N/A;   COLONOSCOPY WITH PROPOFOL  N/A 10/18/2018   Procedure: COLONOSCOPY WITH PROPOFOL ;  Surgeon: Deveron Fly, MD;  Location: [facility] ENDOSCOPY;  Service: Endoscopy;  Laterality: N/A;   ESOPHAGOGASTRODUODENOSCOPY N/A 10/07/2013   Procedure: ESOPHAGOGASTRODUODENOSCOPY (EGD);  Surgeon: Alyce Jubilee, MD;  Location: [facility];  Service: Endoscopy;  Laterality: N/A;  patient received heparin  at 530am given phenergan  25mg  IV 30 minutes before   ESOPHAGOGASTRODUODENOSCOPY     x4   EYE SURGERY     lacrimal gland    FLEXIBLE BRONCHOSCOPY N/A 06/04/2016   Procedure: FLEXIBLE BRONCHOSCOPY;  Surgeon: Cordie Deters, MD;  Location: [facility] ORS;  Service: Pulmonary;  Laterality: N/A;   INCONTINENCE SURGERY  2004   NASAL SINUS SURGERY     THYROID  LOBECTOMY     TOTAL THYROIDECTOMY     Social History    Social History Narrative   Not on file   Immunization History  Administered Date(s) Administered   Influenza Inj Mdck Quad Pf 07/22/2018, 07/05/2019, 06/27/2020, 07/19/2021   Influenza-Unspecified 08/17/2017   Moderna Sars-Covid-2 Vaccination 12/03/2019, 12/23/2019, 01/24/2020, 08/13/2020, 05/15/2021   Pneumococcal Conjugate-13 10/18/2020   Tdap 07/27/2019   Zoster Recombinant(Shingrix) 08/21/2017, 12/08/2017     Objective: Vital Signs: BP (!) 113/49 (BP Location: Left Arm, Patient Position: Sitting)   Pulse 78   Resp 16   Ht 5\' 5"  (1.651 m)   Wt 185 lb (83.9 kg)   BMI 30.79 kg/m    Physical Exam Vitals and nursing note reviewed.  Constitutional:      Appearance: She is well-developed.  HENT:     Head: Normocephalic and atraumatic.  Eyes:     Conjunctiva/sclera: Conjunctivae normal.  Cardiovascular:     Rate and Rhythm: Normal rate and regular rhythm.     Heart sounds: Normal heart sounds.  Pulmonary:     Effort: Pulmonary effort is normal.     Breath sounds: Normal breath sounds.  Abdominal:     General: Bowel sounds are normal.     Palpations: Abdomen is soft.  Musculoskeletal:     Cervical back: Normal range of motion.  Lymphadenopathy:     Cervical: No cervical adenopathy.  Skin:    General: Skin is warm and dry.     Capillary Refill: Capillary refill takes less than 2 seconds.  Neurological:     Mental Status: She is alert and oriented to person, place, and time.  Psychiatric:        Behavior: Behavior normal.      Musculoskeletal Exam: C-spine has good range of motion.  Discomfort with range of motion of the lumbar spine.  Shoulder joints, elbow joints, wrist joints, MCPs, PIPs, DIPs have good range of motion with no synovitis.  PIP and DIP thickening consistent with osteoarthritis of both hands.  Tenderness of both CMC joints.  Hip joints have good range of motion with no groin pain.  Knee joints have good range of motion with no warmth or effusion.   Ankle joints have good range of motion with some tenderness and warmth of the right ankle.  CDAI Exam: CDAI Score: -- Patient Global: --; Provider Global: -- Swollen: --; Tender: -- Joint Exam 03/07/2024   No joint exam has been documented for this visit   There is currently no information documented on the homunculus. Go to the Rheumatology activity and complete the homunculus joint exam.  Investigation:  No additional findings.  Imaging: No results found.  Recent Labs: Lab Results  Component Value Date   WBC 5.5 06/30/2023   HGB 13.0 06/30/2023   PLT 337 06/30/2023   NA 141 06/30/2023   K 4.3 06/30/2023   CL 106 06/30/2023   CO2 23 06/30/2023   GLUCOSE 97 06/30/2023   BUN 14 06/30/2023   CREATININE 0.98 06/30/2023   BILITOT 0.2 06/30/2023   ALKPHOS 73 06/30/2023   AST 15 06/30/2023   ALT 16 06/30/2023   PROT 7.0 06/30/2023   ALBUMIN 4.1 06/30/2023   CALCIUM 9.7 06/30/2023   GFRAA 80 03/06/2021    Speciality Comments: PLQ Eye Exam: 09/29/2023 WNL @ [facility] Follow up in 1 year  Procedures:  No procedures performed Allergies: Aspirin , Keflex [cephalexin], Azithromycin , Barley grass, Clarithromycin, Metoclopramide, Oat, Oatmeal, Other, Rye grass flower pollen extract [gramineae pollens], Vioxx [rofecoxib], Wheat, Wheat extract, Celebrex [celecoxib], and Metoclopramide hcl    Assessment / Plan:     Visit Diagnoses: Sjogren's syndrome with other organ involvement (HCC) - Diagnosed at [facility] 12 years ago and was placed on Plaquenil .  Hx of dry mouth, dental decay and dry eyes.  ANA positive, Ro negative, La negative, RF negative: Patient continues to have chronic sicca symptoms.  She has been seeing the dentist and ophthalmology every 6 months as advised.  She remains on Plaquenil  200 mg 1 tablet by mouth twice daily.  She is tolerating Plaquenil  without any side effects and has not had any interruptions in therapy.  She has been experiencing increased arthralgias  and joint stiffness especially involving both hands.  She has tenderness over both CMC joints as well as PIP and DIP thickening consistent with osteoarthritic changes.  No obvious synovitis was noted.  Plan to obtain the following lab work today for further evaluation. Plan to schedule an ultrasound of both hands to assess for synovitis.   - Plan: ANCA Screen Reflex Titer(QUEST), Sedimentation rate, C-reactive protein, Rheumatoid factor, Cyclic citrul peptide antibody, IgG, C3 and C4, ANA, Urinalysis, Routine w reflex microscopic, Anti-DNA antibody, double-stranded, Serum protein electrophoresis with reflex, Sjogrens syndrome-A extractable nuclear antibody, Sjogrens syndrome-B extractable nuclear antibody  High risk medication use - Plaquenil  200 mg 1 tablet by mouth twice daily.  She restarted plaquenil  in May 2022.  PLQ Eye Exam: 09/29/2023 WNL @ [facility] Follow up in 1 year  CBC and CMP updated on 12/01/23. Orders for CBC and CMP released today.    - Plan: CBC with Differential/Platelet, Comprehensive metabolic panel with GFR  Rash and other nonspecific skin eruption: Patient's been experiencing a rash involving bilateral lower legs after standing or walking for prolonged distances.  She brought pictures revealing patches of erythema.  No obvious petechia or purpura noted.  No rash was evident on examination today.  Plan to obtain the following lab work today.   Chronic right shoulder pain: Not currently symptomatic.   Primary osteoarthritis of both hands: Patient presents today experiencing increased pain and stiffness involving both hands.  She has tenderness over bilateral CMC joints.  PIP and DIP thickening consistent with osteoarthritis of both hands was noted.  Plan to check the following lab work today for further evaluation.  Plan to also schedule ultrasound to assess for synovitis.  Primary osteoarthritis of both knees: Patient has good range of motion of both knee joints on  examination today.  No warmth or effusion noted.  Primary osteoarthritis of both feet: Patient has been experiencing increased pain and  swelling in the right ankle x6 months.  Tenderness and warmth of the right ankle noted today.  Plan to check the following lab work today for further evaluation.  Can consider scheduling a right ankle intra-articular injection in the future if her symptoms persist or worsen.  Fibromyalgia: Patient has generalized hyperalgesia and positive tender points on examination today.  Chronic left-sided low back pain without sciatica: Chronic pain.  She has difficulty standing for prolonged peers of time due to the discomfort.  She takes hydrocodone  sparingly for moderate to severe pain relief.  Chronic pain syndrome: She takes hydrocodone  sparingly for moderate to severe pain relief.  Other medical conditions are listed as follows:   CD (celiac disease)  Esophageal spasm  Nutcracker esophagus  History of gastroesophageal reflux (GERD)  MDD (major depressive disorder), recurrent, in partial remission (HCC)  History of thyroid  cancer  Generalized anxiety disorder  Chronic obstructive pulmonary disease, unspecified COPD type (HCC)  History of asthma  Orders: Orders Placed This Encounter  Procedures   ANCA Screen Reflex Titer(QUEST)   Sedimentation rate   C-reactive protein   Rheumatoid factor   Cyclic citrul peptide antibody, IgG   C3 and C4   ANA   Urinalysis, Routine w reflex microscopic   Anti-DNA antibody, double-stranded   Serum protein electrophoresis with reflex   Sjogrens syndrome-A extractable nuclear antibody   Sjogrens syndrome-B extractable nuclear antibody   CBC with Differential/Platelet   Comprehensive metabolic panel with GFR   No orders of the defined types were placed in this encounter.    Follow-Up Instructions: Return for Sjogren's syndrome, Osteoarthritis.   Romayne Clubs, PA-C  Note - This record has been created  using Dragon software.  Chart creation errors have been sought, but may not always  have been located. Such creation errors do not reflect on  the standard of medical care.

## 2024-02-24 DIAGNOSIS — R928 Other abnormal and inconclusive findings on diagnostic imaging of breast: Secondary | ICD-10-CM | POA: Diagnosis not present

## 2024-02-24 DIAGNOSIS — R920 Mammographic microcalcification found on diagnostic imaging of breast: Secondary | ICD-10-CM | POA: Diagnosis not present

## 2024-02-24 DIAGNOSIS — R921 Mammographic calcification found on diagnostic imaging of breast: Secondary | ICD-10-CM | POA: Diagnosis not present

## 2024-02-24 HISTORY — PX: BREAST BIOPSY: SHX20

## 2024-02-29 ENCOUNTER — Telehealth: Payer: Self-pay | Admitting: Nurse Practitioner

## 2024-02-29 ENCOUNTER — Ambulatory Visit: Admit: 2024-02-29 | Payer: Medicare HMO

## 2024-02-29 ENCOUNTER — Ambulatory Visit: Payer: Medicare HMO | Admitting: Physician Assistant

## 2024-02-29 SURGERY — COLONOSCOPY WITH PROPOFOL
Anesthesia: General

## 2024-02-29 NOTE — Telephone Encounter (Signed)
 Lvm regarding MR request from [place]  pcp-Toni

## 2024-03-01 ENCOUNTER — Telehealth: Payer: Self-pay | Admitting: Nurse Practitioner

## 2024-03-01 NOTE — Telephone Encounter (Signed)
 All MR printed and copied for [place]  [facility]. Will give to patient during 03/03/24 visit-Toni

## 2024-03-03 ENCOUNTER — Ambulatory Visit: Payer: Medicare HMO | Admitting: Physician Assistant

## 2024-03-03 ENCOUNTER — Telehealth: Payer: Self-pay | Admitting: Nurse Practitioner

## 2024-03-03 ENCOUNTER — Ambulatory Visit (INDEPENDENT_AMBULATORY_CARE_PROVIDER_SITE_OTHER): Payer: Medicare HMO | Admitting: Nurse Practitioner

## 2024-03-03 ENCOUNTER — Encounter: Payer: Self-pay | Admitting: Nurse Practitioner

## 2024-03-03 VITALS — BP 120/74 | HR 84 | Temp 98.5°F | Resp 16 | Ht 65.0 in | Wt 185.6 lb

## 2024-03-03 DIAGNOSIS — M797 Fibromyalgia: Secondary | ICD-10-CM | POA: Diagnosis not present

## 2024-03-03 DIAGNOSIS — E782 Mixed hyperlipidemia: Secondary | ICD-10-CM

## 2024-03-03 DIAGNOSIS — F411 Generalized anxiety disorder: Secondary | ICD-10-CM | POA: Diagnosis not present

## 2024-03-03 DIAGNOSIS — J301 Allergic rhinitis due to pollen: Secondary | ICD-10-CM

## 2024-03-03 DIAGNOSIS — E538 Deficiency of other specified B group vitamins: Secondary | ICD-10-CM | POA: Diagnosis not present

## 2024-03-03 DIAGNOSIS — Z79899 Other long term (current) drug therapy: Secondary | ICD-10-CM | POA: Diagnosis not present

## 2024-03-03 DIAGNOSIS — Z760 Encounter for issue of repeat prescription: Secondary | ICD-10-CM

## 2024-03-03 MED ORDER — BD LUER-LOK SYRINGE 25G X 5/8" 3 ML MISC: 0 refills | Status: DC

## 2024-03-03 MED ORDER — ALBUTEROL SULFATE (2.5 MG/3ML) 0.083% IN NEBU: 2.5000 mg | INHALATION_SOLUTION | Freq: Four times a day (QID) | RESPIRATORY_TRACT | 6 refills | Status: AC | PRN

## 2024-03-03 MED ORDER — FENOFIBRATE 145 MG PO TABS: 145.0000 mg | ORAL_TABLET | Freq: Every day | ORAL | 1 refills | Status: AC

## 2024-03-03 MED ORDER — ALBUTEROL SULFATE HFA 108 (90 BASE) MCG/ACT IN AERS: 2.0000 | INHALATION_SPRAY | Freq: Four times a day (QID) | RESPIRATORY_TRACT | 2 refills | Status: AC | PRN

## 2024-03-03 MED ORDER — ELETRIPTAN HYDROBROMIDE 40 MG PO TABS: 40.0000 mg | ORAL_TABLET | Freq: Every day | ORAL | 5 refills | Status: AC | PRN

## 2024-03-03 MED ORDER — HYDROCODONE-ACETAMINOPHEN 7.5-325 MG PO TABS: ORAL_TABLET | ORAL | 0 refills | Status: DC

## 2024-03-03 MED ORDER — CYANOCOBALAMIN 1000 MCG/ML IJ SOLN: 1000.0000 ug | INTRAMUSCULAR | 1 refills | Status: DC

## 2024-03-03 MED ORDER — FLUTICASONE FUROATE-VILANTEROL 100-25 MCG/ACT IN AEPB: 1.0000 | INHALATION_SPRAY | Freq: Every day | RESPIRATORY_TRACT | 3 refills | Status: AC

## 2024-03-03 MED ORDER — MONTELUKAST SODIUM 10 MG PO TABS: ORAL_TABLET | ORAL | 3 refills | Status: AC

## 2024-03-03 MED ORDER — ALPRAZOLAM 0.5 MG PO TABS: 0.5000 mg | ORAL_TABLET | Freq: Three times a day (TID) | ORAL | 2 refills | Status: DC | PRN

## 2024-03-03 MED ORDER — SUMATRIPTAN-NAPROXEN SODIUM 85-500 MG PO TABS: 1.0000 | ORAL_TABLET | Freq: Every day | ORAL | 2 refills | Status: AC | PRN

## 2024-03-03 NOTE — Progress Notes (Signed)
 [facility] [address] [place], [place] [address]  Internal MEDICINE  Office Visit Note  Patient Name: Susan [name]  [id number]  [id number]  Date of Service: 03/03/2024  Chief Complaint  Patient presents with   Depression   Gastroesophageal Reflux   Hyperlipidemia   Follow-up    HPI Susan [name] presents for a follow-up visit for lab results, anxiety, high cholesterol, hypothyroidism and refills.  Low B12 -- was staying down in [place]  for a while and not feeling well. Had labs doen and her B12 was low at 288. Received a B12 injection last month, wants to continue B12 injections at home. Her husband is able to give them to her.  Hypothyroidism  High cholesterol Anxiety  Flat red rash that comes and goes on legs, mildly itchy but otherwise no issues -- no interventions at this time.     Current Medication: Outpatient Encounter Medications as of 03/03/2024  Medication Sig   acetaminophen  (TYLENOL ) 500 MG tablet Take by mouth as needed.   Ascorbic Acid (VITAMIN C) 100 MG tablet Take 100 mg by mouth daily.  (Patient not taking: Reported on 03/07/2024)   azelastine  (ASTELIN ) 0.1 % nasal spray Place 2 sprays into both nostrils 2 (two) times daily. Use in each nostril as directed   carisoprodol  (SOMA ) 350 MG tablet Take 0.5 tablets (175 mg total) by mouth at bedtime. (Patient taking differently: Take 175 mg by mouth as needed.)   Dentifrices (BIOTENE DRY MOUTH DT) by Transmucosal route daily.   estradiol  (CLIMARA  - DOSED IN MG/24 HR) 0.05 mg/24hr patch APPLY 1 PATCH TOPICALLY TO SKIN ONCE A WEEK   Estradiol  (YUVAFEM ) 10 MCG TABS vaginal tablet INSERT 1 TABLET VAGINALLY TWICE A WEEK   ezetimibe  (ZETIA ) 10 MG tablet Take 1 tablet (10 mg total) by mouth daily.   fluticasone  (FLONASE ) 50 MCG/ACT nasal spray Place 2 sprays into both nostrils daily.   folic acid  (FOLVITE ) 1 MG tablet Take 1 tablet (1 mg total) by mouth daily. (Patient not taking: Reported on 03/07/2024)   gabapentin   (NEURONTIN ) 600 MG tablet Take 1/2 (one-half) tablet by mouth daily   hydroxychloroquine  (PLAQUENIL ) 200 MG tablet Take 1 tablet by mouth twice daily   hyoscyamine  (ANASPAZ ) 0.125 MG TBDP disintergrating tablet Place 0.125 mg under the tongue every 4 (four) hours as needed for bladder spasms.    Lactobacillus (DIGESTIVE HEALTH PROBIOTIC PO) Take 1 tablet by mouth daily.    levothyroxine  (SYNTHROID ) 137 MCG tablet Take 1 tablet (137 mcg total) by mouth daily before breakfast.   LINZESS  290 MCG CAPS capsule Take 1 capsule (290 mcg total) by mouth daily.   Magnesium  Oxide (NATRUL MAGNESIUM  PO) Take by mouth.    Multiple Vitamins-Minerals (ZINC PO) Take by mouth daily.   mupirocin  ointment (BACTROBAN ) 2 % Apply topically as needed (dermatitis).   pantoprazole  (PROTONIX ) 40 MG tablet Take 40 mg by mouth daily.    Polyethyl Glycol-Propyl Glycol 0.4-0.3 % SOLN Apply to eye.    Probiotic Product (PHILLIPS COLON HEALTH) CAPS Take by mouth.    sodium chloride  (OCEAN) 0.65 % SOLN nasal spray Place 1 spray into both nostrils as needed for congestion.    valACYclovir  (VALTREX ) 1000 MG tablet 2 grams by mouth every 12 hours for 2 days at the onset of a cold sore   Vitamin D , Ergocalciferol , (DRISDOL ) 1.25 MG (50000 UNIT) CAPS capsule Take 1 capsule by mouth once a week   vitamin E 1000 UNIT capsule Take by mouth.    [  DISCONTINUED] albuterol  (PROVENTIL ) (2.5 MG/3ML) 0.083% nebulizer solution Take 3 mLs (2.5 mg total) by nebulization every 6 (six) hours as needed for wheezing or shortness of breath.   [DISCONTINUED] albuterol  (VENTOLIN  HFA) 108 (90 Base) MCG/ACT inhaler Inhale 2 puffs into the lungs every 6 (six) hours as needed for wheezing or shortness of breath.   [DISCONTINUED] ALPRAZolam  (XANAX ) 0.5 MG tablet Take 1 tablet (0.5 mg total) by mouth 3 (three) times daily as needed for anxiety. for anxiety   [DISCONTINUED] amitriptyline  (ELAVIL ) 25 MG tablet Take 1 tablet (25 mg total) by mouth at bedtime.    [DISCONTINUED] cyanocobalamin  (VITAMIN B12) 1000 MCG/ML injection INJECT 1ML INTRAMUSCULARLY EACH MONTH   [DISCONTINUED] eletriptan  (RELPAX ) 40 MG tablet Take 40 mg by mouth as needed for migraine. may repeat in 2 hours if necessary    [DISCONTINUED] fenofibrate  (TRICOR ) 145 MG tablet Take 1 tablet (145 mg total) by mouth daily.   [DISCONTINUED] fluticasone  furoate-vilanterol (BREO ELLIPTA ) 100-25 MCG/ACT AEPB Inhale 1 puff into the lungs daily.   [DISCONTINUED] HYDROcodone -acetaminophen  (NORCO) 7.5-325 MG tablet One tab po tid for pain prn only   [DISCONTINUED] montelukast  (SINGULAIR ) 10 MG tablet TAKE 1 TABLET BY MOUTH ONCE DAILY FOR ASTHMA   [DISCONTINUED] predniSONE  (DELTASONE ) 10 MG tablet Use as directed for 6 days taper   [DISCONTINUED] SUMAtriptan -naproxen  (TREXIMET ) 85-500 MG tablet daily as needed.    [DISCONTINUED] SYRINGE-NEEDLE, DISP, 3 ML (B-D 3CC LUER-LOK SYR 25GX5/8") 25G X 5/8" 3 ML MISC USE AS DIRECTED ONCE  A  MONTH   [DISCONTINUED] venlafaxine  XR (EFFEXOR -XR) 150 MG 24 hr capsule Take 2 capsules (300 mg total) by mouth at bedtime.   albuterol  (PROVENTIL ) (2.5 MG/3ML) 0.083% nebulizer solution Take 3 mLs (2.5 mg total) by nebulization every 6 (six) hours as needed for wheezing or shortness of breath.   albuterol  (VENTOLIN  HFA) 108 (90 Base) MCG/ACT inhaler Inhale 2 puffs into the lungs every 6 (six) hours as needed for wheezing or shortness of breath.   ALPRAZolam  (XANAX ) 0.5 MG tablet Take 1 tablet (0.5 mg total) by mouth 3 (three) times daily as needed for anxiety. for anxiety   cyanocobalamin  (VITAMIN B12) 1000 MCG/ML injection Inject 1 mL (1,000 mcg total) into the muscle every 30 (thirty) days.   eletriptan  (RELPAX ) 40 MG tablet Take 1 tablet (40 mg total) by mouth daily as needed for migraine. may repeat in 2 hours if necessary   fenofibrate  (TRICOR ) 145 MG tablet Take 1 tablet (145 mg total) by mouth daily.   fluticasone  furoate-vilanterol (BREO ELLIPTA ) 100-25 MCG/ACT AEPB  Inhale 1 puff into the lungs daily.   HYDROcodone -acetaminophen  (NORCO) 7.5-325 MG tablet One tab po tid for pain prn only (Patient taking differently: as needed. One tab po tid for pain prn only)   montelukast  (SINGULAIR ) 10 MG tablet TAKE 1 TABLET BY MOUTH ONCE DAILY FOR ASTHMA   SUMAtriptan -naproxen  (TREXIMET ) 85-500 MG tablet Take 1 tablet by mouth daily as needed for migraine.   SYRINGE-NEEDLE, DISP, 3 ML (B-D 3CC LUER-LOK SYR 25GX5/8") 25G X 5/8" 3 ML MISC USE AS DIRECTED ONCE  A  MONTH   No facility-administered encounter medications on file as of 03/03/2024.    Surgical History: Past Surgical History:  Procedure Laterality Date   ABDOMINAL HYSTERECTOMY  2006   BLADDER REPAIR     BREAST BIOPSY Left 02/24/2024   BREAST CYST ASPIRATION Left yrs ago   CHOLECYSTECTOMY  1989   COLONOSCOPY N/A 10/16/2021   Procedure: COLONOSCOPY;  Surgeon: Marshall Skeeter, MD;  Location: [facility] ENDOSCOPY;  Service: Endoscopy;  Laterality: N/A;   COLONOSCOPY WITH ESOPHAGOGASTRODUODENOSCOPY (EGD)     COLONOSCOPY WITH PROPOFOL  N/A 08/03/2017   Procedure: COLONOSCOPY WITH PROPOFOL ;  Surgeon: Deveron Fly, MD;  Location: [facility] ENDOSCOPY;  Service: Endoscopy;  Laterality: N/A;   COLONOSCOPY WITH PROPOFOL  N/A 10/18/2018   Procedure: COLONOSCOPY WITH PROPOFOL ;  Surgeon: Deveron Fly, MD;  Location: [facility] ENDOSCOPY;  Service: Endoscopy;  Laterality: N/A;   ESOPHAGOGASTRODUODENOSCOPY N/A 10/07/2013   Procedure: ESOPHAGOGASTRODUODENOSCOPY (EGD);  Surgeon: Alyce Jubilee, MD;  Location: [facility] ENDO SUITE;  Service: Endoscopy;  Laterality: N/A;  patient received heparin  at 530am given phenergan  25mg  IV 30 minutes before   ESOPHAGOGASTRODUODENOSCOPY     x4   EYE SURGERY     lacrimal gland    FLEXIBLE BRONCHOSCOPY N/A 06/04/2016   Procedure: FLEXIBLE BRONCHOSCOPY;  Surgeon: Cordie Deters, MD;  Location: [facility] ORS;  Service: Pulmonary;  Laterality: N/A;   INCONTINENCE SURGERY  2004   NASAL SINUS SURGERY      THYROID  LOBECTOMY     TOTAL THYROIDECTOMY      Medical History: Past Medical History:  Diagnosis Date   Arthritis    Asthma    Asthmatic bronchitis    BRCA negative 05/2014   MyRisk neg   Celiac disease    Celiac sprue    Chronic anxiety    Complication of anesthesia    vomiting   COPD (chronic obstructive pulmonary disease) (HCC)    Depression    Environmental allergies    Family history of breast cancer 05/2014   MyRisk neg; IBIS=29.5%   Fibromyalgia    neuropathy all over   Gastritis 10/08/2013   GERD (gastroesophageal reflux disease)    Hyperlipemia    Hyperlipidemia    Intolerant to statins   Hypothyroidism (acquired)    Increased risk of breast cancer 05/2014   IBIS=29.5%   Increased risk of breast cancer 05/2014   IBIS=29.5%   Migraine    Migraines    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     Family History: Family History  Problem Relation Age of Onset   Arthritis/Rheumatoid Mother        died in age 34s   Breast cancer Mother 80   Breast cancer Maternal Aunt        X 2. 50's   Breast cancer Maternal Aunt    Lung cancer Maternal Aunt    Breast cancer Maternal Aunt    Breast cancer Maternal Grandmother        24's   Pancreatitis Neg Hx    Colon cancer Neg Hx     Social History   Socioeconomic History   Marital status: Married    Spouse name: Not on file   Number of children: 0   Years of education: Not on file   Highest education level: Not on file  Occupational History   Not on file  Tobacco Use   Smoking status: Never    Passive exposure: Past   Smokeless tobacco: Never  Vaping Use   Vaping status: Never Used  Substance and Sexual Activity   Alcohol  use: No   Drug use: No   Sexual activity: Not Currently    Birth control/protection: Surgical  Other Topics Concern   Not on file  Social  History Narrative  Not on file   Social Drivers of Health   Financial Resource Strain: Low Risk  (03/07/2021)   Overall Financial Resource Strain (CARDIA)    Difficulty of Paying Living Expenses: Not very hard  Food Insecurity: Not on file  Transportation Needs: Not on file  Physical Activity: Not on file  Stress: Not on file  Social Connections: Not on file  Intimate Partner Violence: Not At Risk (06/12/2022)   [facility]    Threatened: Not on file    Insulted: Not on file    Physically Hurt : Not on file    Scream: Not on file      Review of Systems  Constitutional:  Negative for chills, fatigue and unexpected weight change.  HENT:  Negative for congestion, rhinorrhea, sneezing and sore throat.   Eyes:  Negative for redness.  Respiratory: Negative.  Negative for cough, chest tightness, shortness of breath and wheezing.   Cardiovascular: Negative.  Negative for chest pain and palpitations.  Gastrointestinal: Negative.  Negative for abdominal pain, constipation, diarrhea, nausea and vomiting.  Genitourinary:  Negative for dysuria and frequency.  Musculoskeletal:  Negative for arthralgias, back pain, joint swelling and neck pain.  Skin:  Negative for rash.  Neurological:  Negative for dizziness, tremors, light-headedness and numbness.  Hematological:  Negative for adenopathy. Does not bruise/bleed easily.  Psychiatric/Behavioral:  Negative for behavioral problems (Depression), self-injury, sleep disturbance and suicidal ideas. The patient is nervous/anxious.     Vital Signs: BP 120/74   Pulse 84   Temp 98.5 F (36.9 C)   Resp 16   Ht 5\' 5"  (1.651 m)   Wt 185 lb 9.6 oz (84.2 kg)   SpO2 99%   BMI 30.89 kg/m    Physical Exam Vitals reviewed.  Constitutional:      Appearance: Normal appearance. She is obese.  HENT:     Head: Normocephalic and atraumatic.  Eyes:     Pupils: Pupils are equal, round, and reactive to light.   Cardiovascular:     Rate and Rhythm: Normal rate and regular rhythm.  Pulmonary:     Effort: Pulmonary effort is normal. No respiratory distress.  Neurological:     Mental Status: She is alert and oriented to person, place, and time.  Psychiatric:        Mood and Affect: Mood normal.        Behavior: Behavior normal.        Assessment/Plan: 1. Seasonal allergic rhinitis due to pollen (Primary) Continue montelukast  as prescribed  - montelukast  (SINGULAIR ) 10 MG tablet; TAKE 1 TABLET BY MOUTH ONCE DAILY FOR ASTHMA  Dispense: 90 tablet; Refill: 3  2. Mixed hyperlipidemia Continue fenofibrate  as prescribed.  - fenofibrate  (TRICOR ) 145 MG tablet; Take 1 tablet (145 mg total) by mouth daily.  Dispense: 90 tablet; Refill: 1  3. Fibromyalgia Continue prn hydrocodone  as prescribed.  - HYDROcodone -acetaminophen  (NORCO) 7.5-325 MG tablet; One tab po tid for pain prn only (Patient taking differently: as needed. One tab po tid for pain prn only)  Dispense: 30 tablet; Refill: 0  4. B12 deficiency Continue monthly B12 injections at home.  - cyanocobalamin  (VITAMIN B12) 1000 MCG/ML injection; Inject 1 mL (1,000 mcg total) into the muscle every 30 (thirty) days.  Dispense: 3 mL; Refill: 1 - SYRINGE-NEEDLE, DISP, 3 ML (B-D 3CC LUER-LOK SYR 25GX5/8") 25G X 5/8" 3 ML MISC; USE AS DIRECTED ONCE  A  MONTH  Dispense: 1 each; Refill: 0  5.  Encounter for medication review Medication list reviewed, updated and refills ordered  - fluticasone  furoate-vilanterol (BREO ELLIPTA ) 100-25 MCG/ACT AEPB; Inhale 1 puff into the lungs daily.  Dispense: 180 each; Refill: 3 - SUMAtriptan -naproxen  (TREXIMET ) 85-500 MG tablet; Take 1 tablet by mouth daily as needed for migraine.  Dispense: 10 tablet; Refill: 2 - eletriptan  (RELPAX ) 40 MG tablet; Take 1 tablet (40 mg total) by mouth daily as needed for migraine. may repeat in 2 hours if necessary  Dispense: 10 tablet; Refill: 5 - albuterol  (PROVENTIL ) (2.5 MG/3ML)  0.083% nebulizer solution; Take 3 mLs (2.5 mg total) by nebulization every 6 (six) hours as needed for wheezing or shortness of breath.  Dispense: 75 mL; Refill: 6 - albuterol  (VENTOLIN  HFA) 108 (90 Base) MCG/ACT inhaler; Inhale 2 puffs into the lungs every 6 (six) hours as needed for wheezing or shortness of breath.  Dispense: 1 each; Refill: 2  6. Generalized anxiety disorder Continue alprazolam  as needed as prescribed.  - ALPRAZolam  (XANAX ) 0.5 MG tablet; Take 1 tablet (0.5 mg total) by mouth 3 (three) times daily as needed for anxiety. for anxiety  Dispense: 90 tablet; Refill: 2   General Counseling: Mykala verbalizes understanding of the findings of todays visit and agrees with plan of treatment. I have discussed any further diagnostic evaluation that may be needed or ordered today. We also reviewed her medications today. she has been encouraged to call the office with any questions or concerns that should arise related to todays visit.    No orders of the defined types were placed in this encounter.   Meds ordered this encounter  Medications   ALPRAZolam  (XANAX ) 0.5 MG tablet    Sig: Take 1 tablet (0.5 mg total) by mouth 3 (three) times daily as needed for anxiety. for anxiety    Dispense:  90 tablet    Refill:  2    Refills, keep on file   HYDROcodone -acetaminophen  (NORCO) 7.5-325 MG tablet    Sig: One tab po tid for pain prn only    Dispense:  30 tablet    Refill:  0   fluticasone  furoate-vilanterol (BREO ELLIPTA ) 100-25 MCG/ACT AEPB    Sig: Inhale 1 puff into the lungs daily.    Dispense:  180 each    Refill:  3   fenofibrate  (TRICOR ) 145 MG tablet    Sig: Take 1 tablet (145 mg total) by mouth daily.    Dispense:  90 tablet    Refill:  1    For future refills, keep on file   cyanocobalamin  (VITAMIN B12) 1000 MCG/ML injection    Sig: Inject 1 mL (1,000 mcg total) into the muscle every 30 (thirty) days.    Dispense:  3 mL    Refill:  1    Fill new script today    SYRINGE-NEEDLE, DISP, 3 ML (B-D 3CC LUER-LOK SYR 25GX5/8") 25G X 5/8" 3 ML MISC    Sig: USE AS DIRECTED ONCE  A  MONTH    Dispense:  1 each    Refill:  0   SUMAtriptan -naproxen  (TREXIMET ) 85-500 MG tablet    Sig: Take 1 tablet by mouth daily as needed for migraine.    Dispense:  10 tablet    Refill:  2    Fill new script today   montelukast  (SINGULAIR ) 10 MG tablet    Sig: TAKE 1 TABLET BY MOUTH ONCE DAILY FOR ASTHMA    Dispense:  90 tablet    Refill:  3   eletriptan  (RELPAX )  40 MG tablet    Sig: Take 1 tablet (40 mg total) by mouth daily as needed for migraine. may repeat in 2 hours if necessary    Dispense:  10 tablet    Refill:  5    Fill new script today   albuterol  (PROVENTIL ) (2.5 MG/3ML) 0.083% nebulizer solution    Sig: Take 3 mLs (2.5 mg total) by nebulization every 6 (six) hours as needed for wheezing or shortness of breath.    Dispense:  75 mL    Refill:  6   albuterol  (VENTOLIN  HFA) 108 (90 Base) MCG/ACT inhaler    Sig: Inhale 2 puffs into the lungs every 6 (six) hours as needed for wheezing or shortness of breath.    Dispense:  1 each    Refill:  2    Return in about 12 weeks (around 05/26/2024) for F/U, anxiety med refill, Paco Cislo PCP.   Total time spent:30 Minutes Time spent includes review of chart, medications, test results, and follow up plan with the patient.    Controlled Substance Database was reviewed by me.  This patient was seen by Laurence Pons, FNP-C in collaboration with Dr. Verneta Gone as a part of collaborative care agreement.   Julio Zappia R. Bobbi Burow, MSN, FNP-C Internal medicine

## 2024-03-04 NOTE — Telephone Encounter (Signed)
 error

## 2024-03-07 ENCOUNTER — Encounter: Payer: Self-pay | Admitting: Physician Assistant

## 2024-03-07 ENCOUNTER — Ambulatory Visit: Attending: Physician Assistant | Admitting: Physician Assistant

## 2024-03-07 VITALS — BP 113/49 | HR 78 | Resp 16 | Ht 65.0 in | Wt 185.0 lb

## 2024-03-07 DIAGNOSIS — K900 Celiac disease: Secondary | ICD-10-CM

## 2024-03-07 DIAGNOSIS — K224 Dyskinesia of esophagus: Secondary | ICD-10-CM | POA: Diagnosis not present

## 2024-03-07 DIAGNOSIS — M19071 Primary osteoarthritis, right ankle and foot: Secondary | ICD-10-CM | POA: Diagnosis not present

## 2024-03-07 DIAGNOSIS — M19072 Primary osteoarthritis, left ankle and foot: Secondary | ICD-10-CM

## 2024-03-07 DIAGNOSIS — G894 Chronic pain syndrome: Secondary | ICD-10-CM | POA: Diagnosis not present

## 2024-03-07 DIAGNOSIS — Z79899 Other long term (current) drug therapy: Secondary | ICD-10-CM

## 2024-03-07 DIAGNOSIS — M545 Low back pain, unspecified: Secondary | ICD-10-CM

## 2024-03-07 DIAGNOSIS — M3509 Sicca syndrome with other organ involvement: Secondary | ICD-10-CM

## 2024-03-07 DIAGNOSIS — M19041 Primary osteoarthritis, right hand: Secondary | ICD-10-CM | POA: Diagnosis not present

## 2024-03-07 DIAGNOSIS — M19042 Primary osteoarthritis, left hand: Secondary | ICD-10-CM

## 2024-03-07 DIAGNOSIS — M170 Bilateral primary osteoarthritis of knee: Secondary | ICD-10-CM

## 2024-03-07 DIAGNOSIS — F411 Generalized anxiety disorder: Secondary | ICD-10-CM

## 2024-03-07 DIAGNOSIS — G8929 Other chronic pain: Secondary | ICD-10-CM

## 2024-03-07 DIAGNOSIS — R21 Rash and other nonspecific skin eruption: Secondary | ICD-10-CM

## 2024-03-07 DIAGNOSIS — Z8719 Personal history of other diseases of the digestive system: Secondary | ICD-10-CM

## 2024-03-07 DIAGNOSIS — Z85850 Personal history of malignant neoplasm of thyroid: Secondary | ICD-10-CM

## 2024-03-07 DIAGNOSIS — M25511 Pain in right shoulder: Secondary | ICD-10-CM | POA: Diagnosis not present

## 2024-03-07 DIAGNOSIS — M797 Fibromyalgia: Secondary | ICD-10-CM

## 2024-03-07 DIAGNOSIS — F3341 Major depressive disorder, recurrent, in partial remission: Secondary | ICD-10-CM

## 2024-03-07 DIAGNOSIS — Z8709 Personal history of other diseases of the respiratory system: Secondary | ICD-10-CM

## 2024-03-07 DIAGNOSIS — J449 Chronic obstructive pulmonary disease, unspecified: Secondary | ICD-10-CM

## 2024-03-07 NOTE — Progress Notes (Signed)
 CBC WNL

## 2024-03-08 ENCOUNTER — Ambulatory Visit: Payer: Self-pay | Admitting: *Deleted

## 2024-03-08 NOTE — Progress Notes (Signed)
 UA revealed trace ketones.  No protein.  ESR WNL CMP WNL.

## 2024-03-10 ENCOUNTER — Ambulatory Visit: Attending: Rheumatology | Admitting: Rheumatology

## 2024-03-10 ENCOUNTER — Other Ambulatory Visit: Payer: Self-pay | Admitting: *Deleted

## 2024-03-10 ENCOUNTER — Ambulatory Visit

## 2024-03-10 ENCOUNTER — Telehealth: Payer: Self-pay | Admitting: Rheumatology

## 2024-03-10 DIAGNOSIS — M25571 Pain in right ankle and joints of right foot: Secondary | ICD-10-CM

## 2024-03-10 DIAGNOSIS — M797 Fibromyalgia: Secondary | ICD-10-CM

## 2024-03-10 DIAGNOSIS — M79642 Pain in left hand: Secondary | ICD-10-CM

## 2024-03-10 DIAGNOSIS — M19041 Primary osteoarthritis, right hand: Secondary | ICD-10-CM

## 2024-03-10 DIAGNOSIS — M19042 Primary osteoarthritis, left hand: Secondary | ICD-10-CM

## 2024-03-10 DIAGNOSIS — M79641 Pain in right hand: Secondary | ICD-10-CM

## 2024-03-10 DIAGNOSIS — G8929 Other chronic pain: Secondary | ICD-10-CM

## 2024-03-10 DIAGNOSIS — M170 Bilateral primary osteoarthritis of knee: Secondary | ICD-10-CM

## 2024-03-10 DIAGNOSIS — G894 Chronic pain syndrome: Secondary | ICD-10-CM

## 2024-03-10 DIAGNOSIS — M545 Low back pain, unspecified: Secondary | ICD-10-CM

## 2024-03-10 NOTE — Progress Notes (Signed)
SPEP no abnormal protein bands.

## 2024-03-10 NOTE — Progress Notes (Signed)
 Visit diagnosis: Pain in both hands  Ultrasound examination of bilateral hands was performed per EULAR recommendations. Using 15 MHz transducer, grayscale and power Doppler bilateral second and third MCP joints both dorsal and volar aspects were evaluated to look for synovitis or tenosynovitis. The findings were there was no synovitis or tenosynovitis on ultrasound examination.  Impression: No synovitis or tenosynovitis was noted on the limited ultrasound examination of the hands.  Ultrasound findings were reviewed with the patient.  Nicholas Bari, MD

## 2024-03-10 NOTE — Telephone Encounter (Signed)
Referral placed, pending appt

## 2024-03-10 NOTE — Telephone Encounter (Signed)
 Patient is requesting a referral to [facility] in [place].  Patient states she was a previous patient approximately 10 years ago.

## 2024-03-10 NOTE — Progress Notes (Signed)
 ANA remains positive.   RF and anti-CCP negative  Ro and [place] antibodies negative  Complements WNL dsDNA negative

## 2024-03-13 LAB — COMPREHENSIVE METABOLIC PANEL WITH GFR
AG Ratio: 1.4 (calc) (ref 1.0–2.5)
ALT: 28 U/L (ref 6–29)
AST: 24 U/L (ref 10–35)
Albumin: 4.5 g/dL (ref 3.6–5.1)
Alkaline phosphatase (APISO): 65 U/L (ref 37–153)
BUN: 19 mg/dL (ref 7–25)
CO2: 29 mmol/L (ref 20–32)
Calcium: 10 mg/dL (ref 8.6–10.4)
Chloride: 107 mmol/L (ref 98–110)
Creat: 0.96 mg/dL (ref 0.50–1.05)
Globulin: 3.2 g/dL (ref 1.9–3.7)
Glucose, Bld: 96 mg/dL (ref 65–99)
Potassium: 4.3 mmol/L (ref 3.5–5.3)
Sodium: 141 mmol/L (ref 135–146)
Total Bilirubin: 0.3 mg/dL (ref 0.2–1.2)
Total Protein: 7.7 g/dL (ref 6.1–8.1)
eGFR: 64 mL/min/{1.73_m2} (ref >=60)

## 2024-03-13 LAB — CBC WITH DIFFERENTIAL/PLATELET
Absolute Lymphocytes: 1656 {cells}/uL (ref 850–3900)
Absolute Monocytes: 517 {cells}/uL (ref 200–950)
Basophils Absolute: 39 {cells}/uL (ref 0–200)
Basophils Relative: 0.7 %
Eosinophils Absolute: 204 {cells}/uL (ref 15–500)
Eosinophils Relative: 3.7 %
HCT: 38.7 % (ref 35.0–45.0)
Hemoglobin: 12.9 g/dL (ref 11.7–15.5)
MCH: 29.3 pg (ref 27.0–33.0)
MCHC: 33.3 g/dL (ref 32.0–36.0)
MCV: 87.8 fL (ref 80.0–100.0)
MPV: 9.5 fL (ref 7.5–12.5)
Monocytes Relative: 9.4 %
Neutro Abs: 3086 {cells}/uL (ref 1500–7800)
Neutrophils Relative %: 56.1 %
Platelets: 368 10*3/uL (ref 140–400)
RBC: 4.41 10*6/uL (ref 3.80–5.10)
RDW: 12.6 % (ref 11.0–15.0)
Total Lymphocyte: 30.1 %
WBC: 5.5 10*3/uL (ref 3.8–10.8)

## 2024-03-13 LAB — PROTEIN ELECTROPHORESIS, SERUM, WITH REFLEX
Albumin ELP: 4.4 g/dL (ref 3.8–4.8)
Alpha 1: 0.3 g/dL (ref 0.2–0.3)
Alpha 2: 0.6 g/dL (ref 0.5–0.9)
Beta 2: 0.5 g/dL (ref 0.2–0.5)
Beta Globulin: 0.5 g/dL (ref 0.4–0.6)
Gamma Globulin: 1.3 g/dL (ref 0.8–1.7)
Total Protein: 7.6 g/dL (ref 6.1–8.1)

## 2024-03-13 LAB — C3 AND C4
C3 Complement: 180 mg/dL (ref 83–193)
C4 Complement: 21 mg/dL (ref 15–57)

## 2024-03-13 LAB — RHEUMATOID FACTOR: Rheumatoid fact SerPl-aCnc: <10 [IU]/mL (ref <14)

## 2024-03-13 LAB — ANTI-NUCLEAR AB-TITER (ANA TITER): ANA Titer 1: 1:80 {titer} — ABNORMAL HIGH

## 2024-03-13 LAB — SJOGRENS SYNDROME-B EXTRACTABLE NUCLEAR ANTIBODY: SSB (La) (ENA) Antibody, IgG: <1 AI

## 2024-03-13 LAB — URINALYSIS, ROUTINE W REFLEX MICROSCOPIC
Bilirubin Urine: NEGATIVE
Glucose, UA: NEGATIVE
Hgb urine dipstick: NEGATIVE
Leukocytes,Ua: NEGATIVE
Nitrite: NEGATIVE
Protein, ur: NEGATIVE
Specific Gravity, Urine: 1.024 (ref 1.001–1.035)
pH: 7 (ref 5.0–8.0)

## 2024-03-13 LAB — ANCA SCREEN W REFLEX TITER: ANCA SCREEN: NEGATIVE

## 2024-03-13 LAB — C-REACTIVE PROTEIN: CRP: 3.6 mg/L (ref <8.0)

## 2024-03-13 LAB — SJOGRENS SYNDROME-A EXTRACTABLE NUCLEAR ANTIBODY: SSA (Ro) (ENA) Antibody, IgG: <1 AI

## 2024-03-13 LAB — SEDIMENTATION RATE: Sed Rate: 14 mm/h (ref 0–30)

## 2024-03-13 LAB — CYCLIC CITRUL PEPTIDE ANTIBODY, IGG: Cyclic Citrullin Peptide Ab: <16 U

## 2024-03-13 LAB — ANTI-DNA ANTIBODY, DOUBLE-STRANDED: ds DNA Ab: <1 [IU]/mL

## 2024-03-13 LAB — ANA: Anti Nuclear Antibody (ANA): POSITIVE — AB

## 2024-03-13 NOTE — Progress Notes (Signed)
ANCA screen negative

## 2024-03-26 NOTE — Progress Notes (Unsigned)
 Virtual Visit via Video Note  I connected with Susan [name] on 03/30/24 at 11:30 AM EDT by a video enabled telemedicine application and verified that I am speaking with the correct person using two identifiers.  Location: Patient: home  Provider: office Persons participated in the visit- patient, provider    I discussed the limitations of evaluation and management by telemedicine and the availability of in person appointments. The patient expressed understanding and agreed to proceed.    I discussed the assessment and treatment plan with the patient. The patient was provided an opportunity to ask questions and all were answered. The patient agreed with the plan and demonstrated an understanding of the instructions.   The patient was advised to call back or seek an in-person evaluation if the symptoms worsen or if the condition fails to improve as anticipated.    Todd Fossa, MD    [facility] MD/PA/NP OP Progress Note  03/30/2024 12:03 PM Susan [name]  MRN:  [id number]  Chief Complaint:  Chief Complaint  Patient presents with   Follow-up   HPI:  This is a follow-up appointment for depression, anxiety.  She states that she had breast biopsy, which turned out to be fine.  She has been busy since being back from [place] .  She has been working on housework.  She enjoys the time with her god daughter, who his mother was a friend, who passed away.  She has strong support from her church, and she goes there regularly.  She has fantastic neighbors and friends.  She was feeling exhausted and depressed when she was in [place]  she could not do things.  She has pain.  She also has some stomach issues.  She was found to have vitamin B12 deficiency, and it has been getting supplements.  Her anxiety has been manageable.  She has fair sleep.  She denies SI.  She recognizes she has had tremors.  It has been getting worse.  She agrees to discuss this at her primary care visit.    Substance use    Tobacco Alcohol  Other substances/  Current denies denies denies  Past        Past Treatment           Visit Diagnosis:    ICD-10-CM   1. MDD (major depressive disorder), recurrent, in partial remission (HCC)  F33.41     2. Generalized anxiety disorder  F41.1 venlafaxine  XR (EFFEXOR -XR) 150 MG 24 hr capsule    3. Fibromyalgia  M79.7 amitriptyline  (ELAVIL ) 25 MG tablet      Past Psychiatric History: Please see initial evaluation for full details. I have reviewed the history. No updates at this time.     Past Medical History:  Past Medical History:  Diagnosis Date   Arthritis    Asthma    Asthmatic bronchitis    BRCA negative 05/2014   MyRisk neg   Celiac disease    Celiac sprue    Chronic anxiety    Complication of anesthesia    vomiting   COPD (chronic obstructive pulmonary disease) (HCC)    Depression    Environmental allergies    Family history of breast cancer 05/2014   MyRisk neg; IBIS=29.5%   Fibromyalgia    neuropathy all over   Gastritis 10/08/2013   GERD (gastroesophageal reflux disease)    Hyperlipemia    Hyperlipidemia    Intolerant to statins   Hypothyroidism (acquired)    Increased risk of breast cancer 05/2014   IBIS=29.5%  Increased risk of breast cancer 05/2014   IBIS=29.5%   Migraine    Migraines    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     Past Surgical History:  Procedure Laterality Date   ABDOMINAL HYSTERECTOMY  2006   BLADDER REPAIR     BREAST BIOPSY Left 02/24/2024   BREAST CYST ASPIRATION Left yrs ago   CHOLECYSTECTOMY  1989   COLONOSCOPY N/A 10/16/2021   Procedure: COLONOSCOPY;  Surgeon: Marshall Skeeter, MD;  Location: [facility] ENDOSCOPY;  Service: Endoscopy;  Laterality: N/A;   COLONOSCOPY WITH ESOPHAGOGASTRODUODENOSCOPY (EGD)     COLONOSCOPY WITH PROPOFOL  N/A 08/03/2017   Procedure:  COLONOSCOPY WITH PROPOFOL ;  Surgeon: Deveron Fly, MD;  Location: [facility] ENDOSCOPY;  Service: Endoscopy;  Laterality: N/A;   COLONOSCOPY WITH PROPOFOL  N/A 10/18/2018   Procedure: COLONOSCOPY WITH PROPOFOL ;  Surgeon: Deveron Fly, MD;  Location: [facility] ENDOSCOPY;  Service: Endoscopy;  Laterality: N/A;   ESOPHAGOGASTRODUODENOSCOPY N/A 10/07/2013   Procedure: ESOPHAGOGASTRODUODENOSCOPY (EGD);  Surgeon: Alyce Jubilee, MD;  Location: [facility] ENDO SU[facility];  Service: Endoscopy;  Laterality: N/A;  patient received heparin  at 530am given phenergan  25mg  IV 30 minutes before   ESOPHAGOGASTRODUODENOSCOPY     x4   EYE SURGERY     lacrimal gland    FLEXIBLE BRONCHOSCOPY N/A 06/04/2016   Procedure: FLEXIBLE BRONCHOSCOPY;  Surgeon: Cordie Deters, MD;  Location: [facility] ORS;  Service: Pulmonary;  Laterality: N/A;   INCONTINENCE SURGERY  2004   NASAL SINUS SURGERY     THYROID  LOBECTOMY     TOTAL THYROIDECTOMY      Family Psychiatric History: Please see initial evaluation for full details. I have reviewed the history. No updates at this time.     Family History:  Family History  Problem Relation Age of Onset   Arthritis/Rheumatoid Mother        died in age 36s   Breast cancer Mother 51   Breast cancer Maternal Aunt        X 2. 50's   Breast cancer Maternal Aunt    Lung cancer Maternal Aunt    Breast cancer Maternal Aunt    Breast cancer Maternal Grandmother        45's   Pancreatitis Neg Hx    Colon cancer Neg Hx     Social History:  Social History   Socioeconomic History   Marital status: Married    Spouse name: Not on file   Number of children: 0   Years of education: Not on file   Highest education level: Not on file  Occupational History   Not on file  Tobacco Use   Smoking status: Never    Passive exposure: Past   Smokeless tobacco: Never  Vaping Use   Vaping status: Never Used  Substance and Sexual Activity   Alcohol  use: No   Drug use: No   Sexual activity: Not Currently     Birth control/protection: Surgical  Other Topics Concern   Not on file  Social History Narrative   Not on file   Social Drivers of Health   Financial Resource Strain: Low Risk  (03/07/2021)   Overall Financial Resource Strain (CARDIA)    Difficulty of Paying Living Expenses: Not very hard  Food Insecurity: Not on file  Transportation Needs: Not on file  Physical Activity: Not on file  Stress: Not on file  Social Connections: Not on file    Allergies:  Allergies  Allergen Reactions   Aspirin     Keflex [Cephalexin] Shortness Of Breath   Azithromycin  Hives and Nausea Only   Barley Grass    Clarithromycin Other (See Comments)   Metoclopramide Other (See Comments)    TREMORS Tremors Tremors    Oat Other (See Comments)   Oatmeal Other (See Comments)   Other     Other reaction(s): Other (See Comments) KEOPLEX.   Rye Grass Flower Pollen Extract [Gramineae Pollens]    Vioxx [Rofecoxib] Diarrhea   Wheat Other (See Comments)    Celiac disease   Wheat Extract    Celebrex [Celecoxib] Hives   Metoclopramide Hcl Anxiety    BODY TREMORS    Metabolic Disorder Labs: No results found for: "HGBA1C", "MPG" No results found for: "PROLACTIN" Lab Results  Component Value Date   CHOL 152 06/30/2023   TRIG 104 06/30/2023   HDL 53 06/30/2023   CHOLHDL 2.9 06/30/2023   VLDL 22 03/23/2020   LDLCALC 80 06/30/2023   LDLCALC 114 (H) 04/16/2023   Lab Results  Component Value Date   TSH 0.802 06/30/2023   TSH 6.380 (H) 04/16/2023    Therapeutic Level Labs: No results found for: "LITHIUM" No results found for: "VALPROATE" No results found for: "CBMZ"  Current Medications: Current Outpatient Medications  Medication Sig Dispense Refill   L-Methylfolate 7.5 MG TABS Take 1 tablet (7.5 mg total) by mouth daily. 90 tablet 1   acetaminophen  (TYLENOL ) 500 MG tablet Take by mouth as needed.     albuterol  (PROVENTIL ) (2.5 MG/3ML) 0.083% nebulizer solution Take 3 mLs (2.5 mg total) by  nebulization every 6 (six) hours as needed for wheezing or shortness of breath. 75 mL 6   albuterol  (VENTOLIN  HFA) 108 (90 Base) MCG/ACT inhaler Inhale 2 puffs into the lungs every 6 (six) hours as needed for wheezing or shortness of breath. 1 each 2   ALPRAZolam  (XANAX ) 0.5 MG tablet Take 1 tablet (0.5 mg total) by mouth 3 (three) times daily as needed for anxiety. for anxiety 90 tablet 2   amitriptyline  (ELAVIL ) 25 MG tablet Take 1 tablet (25 mg total) by mouth at bedtime. 90 tablet 1   Ascorbic Acid (VITAMIN C) 100 MG tablet Take 100 mg by mouth daily.  (Patient not taking: Reported on 03/07/2024)     azelastine  (ASTELIN ) 0.1 % nasal spray Place 2 sprays into both nostrils 2 (two) times daily. Use in each nostril as directed 30 mL 4   carisoprodol  (SOMA ) 350 MG tablet Take 0.5 tablets (175 mg total) by mouth at bedtime. (Patient taking differently: Take 175 mg by mouth as needed.) 45 tablet 2   cyanocobalamin  (VITAMIN B12) 1000 MCG/ML injection Inject 1 mL (1,000 mcg total) into the muscle every 30 (thirty) days. 3 mL 1   Dentifrices (BIOTENE DRY MOUTH DT) by Transmucosal route daily.     diphenhydrAMINE  HCl (BENADRYL  PO) Take 12.5 mg by mouth.     eletriptan  (RELPAX ) 40 MG tablet Take 1 tablet (40 mg total) by mouth daily as needed for migraine. may repeat in 2 hours if necessary 10 tablet 5   estradiol  (CLIMARA  - DOSED IN MG/24 HR) 0.05 mg/24hr patch APPLY 1 PATCH TOPICALLY TO SKIN ONCE A WEEK 12 patch 3   Estradiol  (YUVAFEM ) 10 MCG TABS vaginal tablet INSERT 1 TABLET VAGINALLY TWICE A WEEK 24 tablet 3   ezetimibe  (ZETIA ) 10 MG tablet Take 1 tablet (  10 mg total) by mouth daily. 90 tablet 3   fenofibrate  (TRICOR ) 145 MG tablet Take 1 tablet (145 mg total) by mouth daily. 90 tablet 1   fluticasone  (FLONASE ) 50 MCG/ACT nasal spray Place 2 sprays into both nostrils daily. 16 g 3   fluticasone  furoate-vilanterol (BREO ELLIPTA ) 100-25 MCG/ACT AEPB Inhale 1 puff into the lungs daily. 180 each 3   folic  acid (FOLVITE ) 1 MG tablet Take 1 tablet (1 mg total) by mouth daily. (Patient not taking: Reported on 03/07/2024) 90 tablet 1   gabapentin  (NEURONTIN ) 600 MG tablet Take 1/2 (one-half) tablet by mouth daily 45 tablet 3   HYDROcodone -acetaminophen  (NORCO) 7.5-325 MG tablet One tab po tid for pain prn only (Patient taking differently: as needed. One tab po tid for pain prn only) 30 tablet 0   hydroxychloroquine  (PLAQUENIL ) 200 MG tablet Take 1 tablet by mouth twice daily 180 tablet 0   hyoscyamine  (ANASPAZ ) 0.125 MG TBDP disintergrating tablet Place 0.125 mg under the tongue every 4 (four) hours as needed for bladder spasms.      Lactobacillus (DIGESTIVE HEALTH PROBIOTIC PO) Take 1 tablet by mouth daily.      levothyroxine  (SYNTHROID ) 137 MCG tablet Take 1 tablet (137 mcg total) by mouth daily before breakfast. 90 tablet 3   LINZESS  290 MCG CAPS capsule Take 1 capsule (290 mcg total) by mouth daily. 90 capsule 3   Magnesium  Oxide (NATRUL MAGNESIUM  PO) Take by mouth.      montelukast  (SINGULAIR ) 10 MG tablet TAKE 1 TABLET BY MOUTH ONCE DAILY FOR ASTHMA 90 tablet 3   Multiple Vitamins-Minerals (ZINC PO) Take by mouth daily.     mupirocin  ointment (BACTROBAN ) 2 % Apply topically as needed (dermatitis). 22 g 0   pantoprazole  (PROTONIX ) 40 MG tablet Take 40 mg by mouth daily.      Polyethyl Glycol-Propyl Glycol 0.4-0.3 % SOLN Apply to eye.      Probiotic Product (PHILLIPS COLON HEALTH) CAPS Take by mouth.      sodium chloride  (OCEAN) 0.65 % SOLN nasal spray Place 1 spray into both nostrils as needed for congestion.      SUMAtriptan -naproxen  (TREXIMET ) 85-500 MG tablet Take 1 tablet by mouth daily as needed for migraine. 10 tablet 2   SYRINGE-NEEDLE, DISP, 3 ML (B-D 3CC LUER-LOK SYR 25GX5/8") 25G X 5/8" 3 ML MISC USE AS DIRECTED ONCE  A  MONTH 1 each 0   valACYclovir  (VALTREX ) 1000 MG tablet 2 grams by mouth every 12 hours for 2 days at the onset of a cold sore 32 tablet 5   [START ON 05/16/2024]  venlafaxine  XR (EFFEXOR -XR) 150 MG 24 hr capsule Take 2 capsules (300 mg total) by mouth at bedtime. 180 capsule 1   Vitamin D , Ergocalciferol , (DRISDOL ) 1.25 MG (50000 UNIT) CAPS capsule Take 1 capsule by mouth once a week 12 capsule 0   vitamin E 1000 UNIT capsule Take by mouth.      No current facility-administered medications for this visit.     Musculoskeletal: Strength & Muscle Tone: N/A Gait & Station: N/A Patient leans: N/A  Psychiatric Specialty Exam: Review of Systems  Psychiatric/Behavioral:  Positive for dysphoric mood. Negative for agitation, behavioral problems, confusion, decreased concentration, hallucinations, self-injury, sleep disturbance and suicidal ideas. The patient is not nervous/anxious and is not hyperactive.   All other systems reviewed and are negative.   There were no vitals taken for this visit.There is no height or weight on file to calculate BMI.  General Appearance:  Well Groomed  Eye Contact:  Good  Speech:  Clear and Coherent  Volume:  Normal  Mood:  good  Affect:  Appropriate, Congruent, and calm  Thought Process:  Coherent  Orientation:  Full (Time, Place, and Person)  Thought Content: Logical   Suicidal Thoughts:  No  Homicidal Thoughts:  No  Memory:  Immediate;   Good  Judgement:  Good  Insight:  Good  Psychomotor Activity:  mild head tremor  Concentration:  Concentration: Good and Attention Span: Good  Recall:  Good  Fund of Knowledge: Good  Language: Good  Akathisia:  No  Handed:  Right  AIMS (if indicated): not done  Assets:  Communication Skills Desire for Improvement  ADL's:  Intact  Cognition: WNL  Sleep:  Fair   Screenings: Mini-Mental    Flowsheet Row Office Visit from 04/08/2023 in [facility], [facility] Office Visit from 04/02/2022 in [facility], [facility] Clinical Support from 03/26/2021 in [facility], [facility] Clinical Support from 03[facility]22/2020 in [facility], [facility] Clinical Support from  03/22/2019 in [facility], [facility]  Total Score (max 30 points ) 29 27 30 30 30       [id number]    Flowsheet Row Office Visit from 04/08/2023 in [facility], [facility] Office Visit from 04/02/2022 in [facility], [facility] Office Visit from 03/03/2022 in [facility] Video Visit from 10/09/2021 in [facility] Office Visit from 09/24/2021 in [facility], [facility]  PHQ-2 Total Score 0 0 1 1 1       Flowsheet Row Admission (Discharged) from 10/16/2021 in [facility]  C-SSRS RISK CATEGORY No Risk        Assessment and Plan:  Susan [name] is a 68 y.o. year old female with a history of depression, anxiety, Sjogren's syndrome on hydroxychloroquine , bilateral osteoarthritis, COPD, s/p thyroidectomy, hypothyroidism, OSA (on CPAP),  history of Celiac Disease, GERD,  , who presents for follow up appointment for below.   1. MDD (major depressive disorder), recurrent, in partial remission (HCC) 2. Generalized anxiety disorder Other stressors include: loss of her close friends, sister in law, parents    History:  Tx from Dr. Morene Aquas. Anxiety worsened after she had a dental procedure when she was a child. Originally on venlafaxine  300 mg daily, amitriptyline  25 mg at night, xanax  0.5 mg BID  She had an episode of feeling down with exhaustion in the setting of experiencing other physical symptoms.  However, it has been overall improving, and she engages in variety of activities including attending to church regularly.  Will continue current dose of venlafaxine  to target depression and anxiety, along with amitriptyline  given she reports significant benefit for mood and fibromyalgia.  While it is preferable to try monotherapy, she could not tolerate discontinuation despite that she reportedly tried a few times in the past.  Although it has been discussed several times to limit  the use of Xanax , this has been prescribed by another provider.  Will start L-methylfolate due to concern of vitamin deficiency as adjunctive treatment for depression.   2. Cognitive decline Unchanged. Although OT referral was sent for evaluation given her reported memory loss and occasional issues with executive functioning, she declined this referral when she was contacted. Noted that although she reports good benefit from Ritalin in the past, it was reportedly discontinued due to adverse reaction.  Discussed risk of gabapentin  and Xanax  on cognition.   # tremors Exam  is notable for head tremors, which she believes has been worsening lately.  She agrees to discuss this at her upcoming appointment with primary care.    Plan Continue venlafaxine  300 mg daily  Continue amitriptyline  25 mg at night (anticholinergic side effect from higher dose) HR 74, qtc 446 msec 09/2022 Start L- methylfolate 7.5 mg daily  Next appointment: 11/19 at 11 am, video - on carisoprodol  (Soma ), gabapentin  300 mg BID - on xanax  0.5 mg  TID, prescribed by Abelardo Hoehn Abernathy  - TSH, vitamin B12/D, folate checked 06/2023- wnl except slightlly low vitamin D     Past trials of medication: sertraline, Paxil, venlafaxine , Xanax , Ritalin (worked very well)    The patient demonstrates the following risk factors for suicide: Chronic risk factors for suicide include: psychiatric disorder of anxiety. Acute risk factors for suicide include: N/A. Protective factors for this patient include: positive social support, responsibility to others (children, family), coping skills and hope for the future. Considering these factors, the overall suicide risk at this point appears to be low. She owns a gun. Patient is appropriate for outpatient follow up.    Collaboration of Care: Collaboration of Care: Other reviewed notes in Epic  Patient/Guardian was advised Release of Information must be obtained prior to any record release in order to  collaborate their care with an outside provider. Patient/Guardian was advised if they have not already done so to contact the registration department to sign all necessary forms in order for us  to release information regarding their care.   Consent: Patient/Guardian gives verbal consent for treatment and assignment of benefits for services provided during this visit. Patient/Guardian expressed understanding and agreed to proceed.    Todd Fossa, MD 03/30/2024, 12:03 PM

## 2024-03-30 ENCOUNTER — Encounter: Payer: Self-pay | Admitting: Psychiatry

## 2024-03-30 ENCOUNTER — Telehealth (INDEPENDENT_AMBULATORY_CARE_PROVIDER_SITE_OTHER): Payer: Self-pay | Admitting: Psychiatry

## 2024-03-30 DIAGNOSIS — M797 Fibromyalgia: Secondary | ICD-10-CM

## 2024-03-30 DIAGNOSIS — F3341 Major depressive disorder, recurrent, in partial remission: Secondary | ICD-10-CM

## 2024-03-30 DIAGNOSIS — F411 Generalized anxiety disorder: Secondary | ICD-10-CM | POA: Diagnosis not present

## 2024-03-30 MED ORDER — L-METHYLFOLATE 7.5 MG PO TABS: 7.5000 mg | ORAL_TABLET | Freq: Every day | ORAL | 1 refills | Status: DC

## 2024-03-30 MED ORDER — AMITRIPTYLINE HCL 25 MG PO TABS: 25.0000 mg | ORAL_TABLET | Freq: Every day | ORAL | 1 refills | Status: DC

## 2024-03-30 MED ORDER — VENLAFAXINE HCL ER 150 MG PO CP24: 300.0000 mg | ORAL_CAPSULE | Freq: Every day | ORAL | 1 refills | Status: AC

## 2024-03-30 NOTE — Patient Instructions (Signed)
 Continue venlafaxine  300 mg daily  Continue amitriptyline  25 mg at night  Start L- methylfolate 7.5 mg daily  Next appointment: 11/19 at 11 am

## 2024-04-02 ENCOUNTER — Encounter: Payer: Self-pay | Admitting: Nurse Practitioner

## 2024-04-05 ENCOUNTER — Other Ambulatory Visit: Admitting: Rheumatology

## 2024-04-12 ENCOUNTER — Other Ambulatory Visit: Payer: Self-pay | Admitting: Psychiatry

## 2024-04-12 DIAGNOSIS — F411 Generalized anxiety disorder: Secondary | ICD-10-CM

## 2024-04-13 ENCOUNTER — Encounter: Payer: Self-pay | Admitting: Nurse Practitioner

## 2024-04-13 ENCOUNTER — Ambulatory Visit (INDEPENDENT_AMBULATORY_CARE_PROVIDER_SITE_OTHER): Payer: Medicare HMO | Admitting: Nurse Practitioner

## 2024-04-13 VITALS — BP 115/85 | HR 76 | Temp 97.8°F | Resp 16 | Ht 65.0 in | Wt 186.0 lb

## 2024-04-13 DIAGNOSIS — Z760 Encounter for issue of repeat prescription: Secondary | ICD-10-CM

## 2024-04-13 DIAGNOSIS — Z0000 Encounter for general adult medical examination without abnormal findings: Secondary | ICD-10-CM

## 2024-04-13 DIAGNOSIS — E538 Deficiency of other specified B group vitamins: Secondary | ICD-10-CM

## 2024-04-13 DIAGNOSIS — E559 Vitamin D deficiency, unspecified: Secondary | ICD-10-CM

## 2024-04-13 DIAGNOSIS — G250 Essential tremor: Secondary | ICD-10-CM

## 2024-04-13 DIAGNOSIS — E039 Hypothyroidism, unspecified: Secondary | ICD-10-CM

## 2024-04-13 MED ORDER — BD LUER-LOK SYRINGE 25G X 5/8" 3 ML MISC: 0 refills | Status: DC

## 2024-04-13 MED ORDER — FOLIC ACID 1 MG PO TABS: 1.0000 mg | ORAL_TABLET | Freq: Every day | ORAL | 1 refills | Status: AC

## 2024-04-13 NOTE — Progress Notes (Signed)
 [facility] [address] [place], [place] [address]  Internal MEDICINE  Office Visit Note  Patient Name: Susan [name]  [id number]  [id number]  Date of Service: 04/13/2024  Chief Complaint  Patient presents with   Depression   Gastroesophageal Reflux   Hyperlipidemia   Medicare Wellness    HPI Susan [name] presents for a medicare annual wellness visit.  Well-appearing 68 y.o. female with migraine, asthma, COPD, OSA, celiac, GERD, hypothyroidism, essential tremor, fibromyalgia, GAD, depression, and allergic rhinitis.  Routine CRC screening: due in 2035 Routine mammogram: diagnostic mammogram was done in January and biopsy was done in [place]  in April and this was negative.  DEXA scan: normal scan in 2022 Labs: due in September  New or worsening pain: chronic back pain, goes to pain clinic.  Other concerns: none     04/13/2024    2:33 PM 04/08/2023    2:03 PM 04/02/2022    3:14 PM  MMSE - Mini Mental State Exam  Orientation to time 5 5 5   Orientation to Place 5 5 5   Registration 3 3 3   Attention/ Calculation 5 5 5   Recall 3 3 0  Language- name 2 objects 2 2 2   Language- repeat 1 1 1   Language- follow 3 step command 3 3 3   Language- read & follow direction 1 1 1   Write a sentence 1 0 1  Copy design 1 1 1   Total score 30 29 27     Functional Status Survey: Is the patient deaf or have difficulty hearing?: No Does the patient have difficulty seeing, even when wearing glasses/contacts?: No Does the patient have difficulty concentrating, remembering, or making decisions?: No Does the patient have difficulty walking or climbing stairs?: No Does the patient have difficulty dressing or bathing?: No Does the patient have difficulty doing errands alone such as visiting a doctor's office or shopping?: No     10/16/2021    8:33 AM 04/02/2022    3:09 PM 10/01/2022    1:18 PM 04/08/2023    2:02 PM 04/13/2024    2:33 PM  Fall Risk  Falls in the past year?  1 1 1  0  Was  there an injury with Fall?  0 0 0   Fall Risk Category Calculator  2 2 1    Fall Risk Category (Retired)  Moderate  Moderate     (RETIRED) Patient Fall Risk Level Moderate fall risk  Moderate fall risk  Moderate fall risk     Fall risk Follow up  Falls evaluation completed  Falls evaluation completed  Falls evaluation completed      Data saved with a previous flowsheet row definition       04/13/2024    2:33 PM  Depression screen PHQ 2/9  Decreased Interest 0  Down, Depressed, Hopeless 0  PHQ - 2 Score 0        Current Medication: Outpatient Encounter Medications as of 04/13/2024  Medication Sig   acetaminophen  (TYLENOL ) 500 MG tablet Take by mouth as needed.   albuterol  (PROVENTIL ) (2.5 MG/3ML) 0.083% nebulizer solution Take 3 mLs (2.5 mg total) by nebulization every 6 (six) hours as needed for wheezing or shortness of breath.   albuterol  (VENTOLIN  HFA) 108 (90 Base) MCG/ACT inhaler Inhale 2 puffs into the lungs every 6 (six) hours as needed for wheezing or shortness of breath.   ALPRAZolam  (XANAX ) 0.5 MG tablet Take 1 tablet (0.5 mg total) by mouth 3 (three) times daily as needed for anxiety. for anxiety  amitriptyline  (ELAVIL ) 25 MG tablet Take 1 tablet (25 mg total) by mouth at bedtime.   Ascorbic Acid (VITAMIN C) 100 MG tablet Take 100 mg by mouth daily.  (Patient not taking: Reported on 03/07/2024)   azelastine  (ASTELIN ) 0.1 % nasal spray Place 2 sprays into both nostrils 2 (two) times daily. Use in each nostril as directed   carisoprodol  (SOMA ) 350 MG tablet Take 0.5 tablets (175 mg total) by mouth at bedtime. (Patient taking differently: Take 175 mg by mouth as needed.)   cyanocobalamin  (VITAMIN B12) 1000 MCG/ML injection Inject 1 mL (1,000 mcg total) into the muscle every 30 (thirty) days.   Dentifrices (BIOTENE DRY MOUTH DT) by Transmucosal route daily.   diphenhydrAMINE  HCl (BENADRYL  PO) Take 12.5 mg by mouth.   eletriptan  (RELPAX ) 40 MG tablet Take 1 tablet (40 mg total)  by mouth daily as needed for migraine. may repeat in 2 hours if necessary   estradiol  (CLIMARA  - DOSED IN MG/24 HR) 0.05 mg/24hr patch APPLY 1 PATCH TOPICALLY TO SKIN ONCE A WEEK   Estradiol  (YUVAFEM ) 10 MCG TABS vaginal tablet INSERT 1 TABLET VAGINALLY TWICE A WEEK   ezetimibe  (ZETIA ) 10 MG tablet Take 1 tablet (10 mg total) by mouth daily.   fenofibrate  (TRICOR ) 145 MG tablet Take 1 tablet (145 mg total) by mouth daily.   fluticasone  (FLONASE ) 50 MCG/ACT nasal spray Place 2 sprays into both nostrils daily.   fluticasone  furoate-vilanterol (BREO ELLIPTA ) 100-25 MCG/ACT AEPB Inhale 1 puff into the lungs daily.   folic acid  (FOLVITE ) 1 MG tablet Take 1 tablet (1 mg total) by mouth daily.   gabapentin  (NEURONTIN ) 600 MG tablet Take 1/2 (one-half) tablet by mouth daily   HYDROcodone -acetaminophen  (NORCO) 7.5-325 MG tablet One tab po tid for pain prn only (Patient taking differently: as needed. One tab po tid for pain prn only)   hydroxychloroquine  (PLAQUENIL ) 200 MG tablet Take 1 tablet by mouth twice daily   hyoscyamine  (ANASPAZ ) 0.125 MG TBDP disintergrating tablet Place 0.125 mg under the tongue every 4 (four) hours as needed for bladder spasms.    Lactobacillus (DIGESTIVE HEALTH PROBIOTIC PO) Take 1 tablet by mouth daily.    levothyroxine  (SYNTHROID ) 137 MCG tablet Take 1 tablet (137 mcg total) by mouth daily before breakfast.   LINZESS  290 MCG CAPS capsule Take 1 capsule (290 mcg total) by mouth daily.   Magnesium  Oxide (NATRUL MAGNESIUM  PO) Take by mouth.    montelukast  (SINGULAIR ) 10 MG tablet TAKE 1 TABLET BY MOUTH ONCE DAILY FOR ASTHMA   Multiple Vitamins-Minerals (ZINC PO) Take by mouth daily.   mupirocin  ointment (BACTROBAN ) 2 % Apply topically as needed (dermatitis).   pantoprazole  (PROTONIX ) 40 MG tablet Take 40 mg by mouth daily.    Polyethyl Glycol-Propyl Glycol 0.4-0.3 % SOLN Apply to eye.    Probiotic Product (PHILLIPS COLON HEALTH) CAPS Take by mouth.    sodium chloride  (OCEAN)  0.65 % SOLN nasal spray Place 1 spray into both nostrils as needed for congestion.    SUMAtriptan -naproxen  (TREXIMET ) 85-500 MG tablet Take 1 tablet by mouth daily as needed for migraine.   SYRINGE-NEEDLE, DISP, 3 ML (B-D 3CC LUER-LOK SYR 25GX5/8) 25G X 5/8 3 ML MISC USE AS DIRECTED ONCE  A  MONTH   valACYclovir  (VALTREX ) 1000 MG tablet 2 grams by mouth every 12 hours for 2 days at the onset of a cold sore   [START ON 05/16/2024] venlafaxine  XR (EFFEXOR -XR) 150 MG 24 hr capsule Take 2 capsules (300 mg total) by  mouth at bedtime.   Vitamin D , Ergocalciferol , (DRISDOL ) 1.25 MG (50000 UNIT) CAPS capsule Take 1 capsule by mouth once a week   vitamin E 1000 UNIT capsule Take by mouth.    [DISCONTINUED] folic acid  (FOLVITE ) 1 MG tablet Take 1 tablet (1 mg total) by mouth daily. (Patient not taking: Reported on 03/07/2024)   [DISCONTINUED] L-Methylfolate 7.5 MG TABS Take 1 tablet (7.5 mg total) by mouth daily.   [DISCONTINUED] SYRINGE-NEEDLE, DISP, 3 ML (B-D 3CC LUER-LOK SYR 25GX5/8) 25G X 5/8 3 ML MISC USE AS DIRECTED ONCE  A  MONTH   No facility-administered encounter medications on file as of 04/13/2024.    Surgical History: Past Surgical History:  Procedure Laterality Date   ABDOMINAL HYSTERECTOMY  2006   BLADDER REPAIR     BREAST BIOPSY Left 02/24/2024   BREAST CYST ASPIRATION Left yrs ago   CHOLECYSTECTOMY  1989   COLONOSCOPY N/A 10/16/2021   Procedure: COLONOSCOPY;  Surgeon: Dessa Reyes ORN, MD;  Location: [facility] ENDOSCOPY;  Service: Endoscopy;  Laterality: N/A;   COLONOSCOPY WITH ESOPHAGOGASTRODUODENOSCOPY (EGD)     COLONOSCOPY WITH PROPOFOL  N/A 08/03/2017   Procedure: COLONOSCOPY WITH PROPOFOL ;  Surgeon: Gaylyn Gladis PENNER, MD;  Location: [facility] ENDOSCOPY;  Service: Endoscopy;  Laterality: N/A;   COLONOSCOPY WITH PROPOFOL  N/A 10/18/2018   Procedure: COLONOSCOPY WITH PROPOFOL ;  Surgeon: Gaylyn Gladis PENNER, MD;  Location[facility] ENDOSCOPY;  Service: Endoscopy;  Laterality: N/A;    ESOPHAGOGASTRODUODENOSCOPY N/A 10/07/2013   Procedure: ESOPHAGOGASTRODUODENOSCOPY (EGD);  Surgeon: Margo LITTIE Haddock, MD;  Location: [facility] ENDO SUITE;  Service: Endoscopy;  Laterality: N/A;  patient received heparin  at 530am given phenergan  25mg  IV 30 minutes before   ESOPHAGOGASTRODUODENOSCOPY     x4   EYE SURGERY     lacrimal gland    FLEXIBLE BRONCHOSCOPY N/A 06/04/2016   Procedure: FLEXIBLE BRONCHOSCOPY;  Surgeon: Elfreda DELENA Bathe, MD;  Location: [facility] ORS;  Service: Pulmonary;  Laterality: N/A;   INCONTINENCE SURGERY  2004   NASAL SINUS SURGERY     THYROID  LOBECTOMY     TOTAL THYROIDECTOMY      Medical History: Past Medical History:  Diagnosis Date   Arthritis    Asthma    Asthmatic bronchitis    BRCA negative 05/2014   MyRisk neg   Celiac disease    Celiac sprue    Chronic anxiety    Complication of anesthesia    vomiting   COPD (chronic obstructive pulmonary disease) (HCC)    Depression    Environmental allergies    Family history of breast cancer 05/2014   MyRisk neg; IBIS=29.5%   Fibromyalgia    neuropathy all over   Gastritis 10/08/2013   GERD (gastroesophageal reflux disease)    Hyperlipemia    Hyperlipidemia    Intolerant to statins   Hypothyroidism (acquired)    Increased risk of breast cancer 05/2014   IBIS=29.5%   Increased risk of breast cancer 05/2014   IBIS=29.5%   Migraine    Migraines    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     Family History: Family History  Problem Relation Age of Onset   Arthritis/Rheumatoid Mother        died in age 37s   Breast cancer Mother 64   Breast cancer Maternal Aunt        X 2. 2's  Breast cancer Maternal Aunt    Lung cancer Maternal Aunt    Breast cancer Maternal Aunt    Breast cancer Maternal Grandmother        40's   Pancreatitis Neg Hx    Colon cancer Neg Hx      Social History   Socioeconomic History   Marital status: Married    Spouse name: Not on file   Number of children: 0   Years of education: Not on file   Highest education level: Not on file  Occupational History   Not on file  Tobacco Use   Smoking status: Never    Passive exposure: Past   Smokeless tobacco: Never  Vaping Use   Vaping status: Never Used  Substance and Sexual Activity   Alcohol  use: No   Drug use: No   Sexual activity: Not Currently    Birth control/protection: Surgical  Other Topics Concern   Not on file  Social History Narrative   Not on file   Social Drivers of Health   Financial Resource Strain: Low Risk  (03/07/2021)   Overall Financial Resource Strain (CARDIA)    Difficulty of Paying Living Expenses: Not very hard  Food Insecurity: Not on file  Transportation Needs: Not on file  Physical Activity: Not on file  Stress: Not on file  Social Connections: Not on file  Intimate Partner Violence: Not At Risk (06/12/2022)   Received from [facility]    Threatened: Not on file    Insulted: Not on file    Physically Hurt : Not on file    Scream: Not on file      Review of Systems  Constitutional:  Negative for activity change, appetite change, chills, fatigue, fever and unexpected weight change.  HENT: Negative.  Negative for congestion, ear pain, rhinorrhea, sore throat and trouble swallowing.   Eyes: Negative.   Respiratory: Negative.  Negative for cough, chest tightness, shortness of breath and wheezing.   Cardiovascular: Negative.  Negative for chest pain.  Gastrointestinal: Negative.  Negative for abdominal pain, blood in stool, constipation, diarrhea, nausea and vomiting.  Endocrine: Negative.   Genitourinary: Negative.  Negative for difficulty urinating, dysuria, frequency, hematuria and urgency.  Musculoskeletal: Negative.  Negative for arthralgias, back pain, joint swelling, myalgias and neck pain.  Skin: Negative.   Negative for rash and wound.  Allergic/Immunologic: Negative.  Negative for immunocompromised state.  Neurological: Negative.  Negative for dizziness, seizures, numbness and headaches.  Hematological: Negative.   Psychiatric/Behavioral:  Negative for behavioral problems, self-injury and suicidal ideas. The patient is not nervous/anxious.     Vital Signs: BP 115/85   Pulse 76   Temp 97.8 F (36.6 C)   Resp 16   Ht 5' 5 (1.651 m)   Wt 186 lb (84.4 kg)   SpO2 98%   BMI 30.95 kg/m    Physical Exam Vitals reviewed.  Constitutional:      General: She is awake. She is not in acute distress.    Appearance: Normal appearance. She is well-developed and well-groomed. She is obese. She is not ill-appearing or diaphoretic.  HENT:     Head: Normocephalic and atraumatic.     Right Ear: Tympanic membrane, ear canal and external ear normal.     Left Ear: Tympanic membrane, ear canal and external ear normal.     Nose: Nose normal. No congestion or rhinorrhea.     Mouth/Throat:     Mouth: Mucous membranes are moist.  Pharynx: Oropharynx is clear. No oropharyngeal exudate or posterior oropharyngeal erythema.  Eyes:     General: Lids are normal. Vision grossly intact. Gaze aligned appropriately. No scleral icterus.       Right eye: No discharge.        Left eye: No discharge.     Extraocular Movements: Extraocular movements intact.     Conjunctiva/sclera: Conjunctivae normal.     Pupils: Pupils are equal, round, and reactive to light.     Funduscopic exam:    Right eye: Red reflex present.        Left eye: Red reflex present. Neck:     Thyroid : No thyromegaly.     Vascular: No JVD.     Trachea: Trachea and phonation normal. No tracheal deviation.  Cardiovascular:     Rate and Rhythm: Normal rate and regular rhythm.     Heart sounds: Normal heart sounds, S1 normal and S2 normal. No murmur heard.    No friction rub. No gallop.  Pulmonary:     Effort: Pulmonary effort is normal. No  accessory muscle usage or respiratory distress.     Breath sounds: Normal breath sounds and air entry. No stridor. No wheezing or rales.  Chest:     Chest wall: No tenderness.  Abdominal:     General: Bowel sounds are normal. There is no distension.     Palpations: Abdomen is soft. There is no mass.     Tenderness: There is no abdominal tenderness. There is no guarding or rebound.  Musculoskeletal:        General: No tenderness or deformity. Normal range of motion.     Cervical back: Normal range of motion and neck supple.     Right lower leg: No edema.     Left lower leg: No edema.  Lymphadenopathy:     Cervical: No cervical adenopathy.  Skin:    General: Skin is warm and dry.     Capillary Refill: Capillary refill takes less than 2 seconds.     Coloration: Skin is not pale.     Findings: No erythema or rash.  Neurological:     Mental Status: She is alert and oriented to person, place, and time.     Cranial Nerves: No cranial nerve deficit.     Motor: No abnormal muscle tone.     Coordination: Coordination normal.     Gait: Gait normal.     Deep Tendon Reflexes: Reflexes are normal and symmetric.  Psychiatric:        Mood and Affect: Mood normal.        Behavior: Behavior normal. Behavior is cooperative.        Thought Content: Thought content normal.        Judgment: Judgment normal.        Assessment/Plan: 1. Encounter for subsequent annual wellness visit (AWV) in Medicare patient (Primary) Age-appropriate preventive screenings and vaccinations discussed. Routine labs for health maintenance will be ordered if needed.  PHM updated.    2. Essential tremor Referred to neurology for further evaluation.  - Ambulatory referral to Neurology  3. Acquired hypothyroidism Continue levothyroxine  as prescribed.   4. B12 deficiency Continue B12 injections as prescribed  5. Folate deficiency Continue folic acid  supplement as prescribed.  - folic acid  (FOLVITE ) 1 MG tablet;  Take 1 tablet (1 mg total) by mouth daily.  Dispense: 90 tablet; Refill: 1  6. Vitamin D  deficiency Continue OTC vitamin D  supplement as discussed.  General Counseling: virna livengood understanding of the findings of todays visit and agrees with plan of treatment. I have discussed any further diagnostic evaluation that may be needed or ordered today. We also reviewed her medications today. she has been encouraged to call the office with any questions or concerns that should arise related to todays visit.    Orders Placed This Encounter  Procedures   Ambulatory referral to Neurology    Meds ordered this encounter  Medications   folic acid  (FOLVITE ) 1 MG tablet    Sig: Take 1 tablet (1 mg total) by mouth daily.    Dispense:  90 tablet    Refill:  1   SYRINGE-NEEDLE, DISP, 3 ML (B-D 3CC LUER-LOK SYR 25GX5/8) 25G X 5/8 3 ML MISC    Sig: USE AS DIRECTED ONCE  A  MONTH    Dispense:  5 each    Refill:  0    Fill new script today    Return in about 5 months (around 09/13/2024) for F/U, Xitlalic Maslin PCP.   Total time spent:30 Minutes Time spent includes review of chart, medications, test results, and follow up plan with the patient.   [place] Controlled Substance Database was reviewed by me.  This patient was seen by Mardy Maxin, FNP-C in collaboration with Dr. Sigrid Bathe as a part of collaborative care agreement.  Cire Deyarmin R. Maxin, MSN, FNP-C Internal medicine

## 2024-04-14 ENCOUNTER — Telehealth: Payer: Self-pay | Admitting: Nurse Practitioner

## 2024-04-14 NOTE — Telephone Encounter (Signed)
 Awaiting 04/13/24 office notes for Neurology referral-Toni

## 2024-04-18 ENCOUNTER — Other Ambulatory Visit: Payer: Self-pay | Admitting: Nurse Practitioner

## 2024-04-18 DIAGNOSIS — Z760 Encounter for issue of repeat prescription: Secondary | ICD-10-CM

## 2024-05-02 ENCOUNTER — Ambulatory Visit: Admitting: Pain Medicine

## 2024-05-04 DIAGNOSIS — L739 Follicular disorder, unspecified: Secondary | ICD-10-CM | POA: Diagnosis not present

## 2024-05-04 DIAGNOSIS — L568 Other specified acute skin changes due to ultraviolet radiation: Secondary | ICD-10-CM | POA: Diagnosis not present

## 2024-05-04 DIAGNOSIS — L710 Perioral dermatitis: Secondary | ICD-10-CM | POA: Diagnosis not present

## 2024-05-10 DIAGNOSIS — Z79899 Other long term (current) drug therapy: Secondary | ICD-10-CM | POA: Insufficient documentation

## 2024-05-10 DIAGNOSIS — Z789 Other specified health status: Secondary | ICD-10-CM | POA: Insufficient documentation

## 2024-05-10 DIAGNOSIS — M899 Disorder of bone, unspecified: Secondary | ICD-10-CM | POA: Insufficient documentation

## 2024-05-10 DIAGNOSIS — G894 Chronic pain syndrome: Secondary | ICD-10-CM | POA: Insufficient documentation

## 2024-05-10 NOTE — Patient Instructions (Signed)

## 2024-05-10 NOTE — Progress Notes (Unsigned)
 PROVIDER NOTE: Interpretation of information contained herein should be left to medically-trained personnel. Specific patient instructions are provided elsewhere under Patient Instructions section of medical record. This document was created in part using AI and STT-dictation technology, any transcriptional errors that may result from this process are unintentional.  Patient: Susan [name]  Service: E/M Encounter  Provider: Eric DELENA Como, MD  DOB: 1956-01-27  Delivery: Face-to-face  Specialty: Interventional Pain Management  MRN: [id number]  Setting: Ambulatory outpatient facility  Specialty designation: 09  Type: New Patient  Location: Outpatient office facility  PCP: Liana Fish, NP  DOS: 05/11/2024    Referring Prov.: Dolphus Reiter, MD   Primary Reason(s) for Visit: Encounter for initial evaluation of one or more chronic problems (new to examiner) potentially causing chronic pain, and posing a threat to normal musculoskeletal function. (Level of risk: High) CC: No chief complaint on file.  HPI  Susan [name] is a 68 y.o. year old, female patient, who comes for the first time to our practice referred by Dolphus Reiter, MD for our initial evaluation of her chronic pain. She has Esophageal spasm; Substernal precordial chest pain; Chronic anxiety; Gastroesophageal reflux disease without esophagitis; Chronic asthma; Nutcracker esophagus; Anxiety and depression; Fibromyalgia; Hypothyroidism; Chronic migraine without aura; Bradycardia; Gastritis; Acute asthma exacerbation; Acute respiratory failure (HCC); Vaginal atrophy; Low grade squamous intraepith lesion on cytologic smear vagina (lgsil); Chronic obstructive pulmonary disease, unspecified (HCC); Shortness of breath; Allergic rhinitis, unspecified; Celiac disease; Chronic right-sided low back pain without sciatica; Gastroesophageal reflux disease with esophagitis; Sjogren's disease (HCC); Primary Sjogren's syndrome (HCC); Chronic pain of  both knees; Encounter for general adult medical examination with abnormal findings; Primary osteoarthritis of both first carpometacarpal joints; Generalized anxiety disorder; Dysuria; MDD (major depressive disorder), recurrent, in partial remission (HCC); Chronic wrist pain, right; Chronic pain of left wrist; Pain in joints of right hand; Pain in joint of left hand; Primary osteoarthritis of both hands; OSA (obstructive sleep apnea); 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; Primary fibrositis; Essential tremor; Menopause; Lymphadenopathy of head and neck; Screening for osteoporosis; Need for vaccination against Streptococcus pneumoniae using pneumococcal conjugate vaccine 13; Hyperlipidemia; Abdominal migraine, not intractable; Lower abdominal pain; Abnormal mouth sensation; Change in bowel habit; Irritable bowel syndrome with constipation; Medication refill; Osteoarthritis; Polypharmacy; Postoperative hypothyroidism; Sjogren syndrome, unspecified (HCC); Encounter for chronic pain management; Well adult exam; Chronic pain syndrome; Pharmacologic therapy; Disorder of skeletal system; and Problems influencing health status on their problem list. Today she comes in for evaluation of her No chief complaint on file.  Pain Assessment: Location:     Radiating:   Onset:   Duration:   Quality:   Severity:  /10 (subjective, self-reported pain score)  Effect on ADL:   Timing:   Modifying factors:   BP:    HR:    Onset and Duration: {Hx; Onset and Duration:210120511} Cause of pain: {Hx; Cause:210120521} Severity: {Pain Severity:210120502} Timing: {Symptoms; Timing:210120501} Aggravating Factors: {Causes; Aggravating pain factors:210120507} Alleviating Factors: {Causes; Alleviating Factors:210120500} Associated Problems: {Hx; Associated problems:210120515} Quality of Pain: {Hx; Symptom quality or Descriptor:210120531} Previous Examinations or Tests: {Hx; Previous examinations or test:210120529} Previous  Treatments: {Hx; Previous Treatment:210120503}  Susan [name] is being evaluated for possible interventional pain management therapies for the treatment of her chronic pain.  Discussed the use of AI scribe software for clinical note transcription with the patient, who gave verbal consent to proceed.  History of Present Illness           ***  Susan [name] has been informed that this  initial visit was an evaluation only.  On the follow up appointment I will go over the results, including ordered tests and available interventional therapies. At that time she will have the opportunity to decide whether to proceed with offered therapies or not. In the event that Susan [name] prefers avoiding interventional options, this will conclude our involvement in the case.  Medication management recommendations may be provided upon request.  Patient informed that diagnostic tests may be ordered to assist in identifying underlying causes, narrow the list of differential diagnoses and aid in determining candidacy for (or contraindications to) planned therapeutic interventions.  Historic Controlled Substance Pharmacotherapy Review PMP and historical list of controlled substances: Gabapentin  600 mg tablet (15/month) (last filled on 04/30/2024); alprazolam  0.5 mg tablet, 1 tab p.o. 3 times daily (90/month) (last filled on 04/30/2024); hydrocodone /APAP 7.5/325, 1 tab p.o. 3 times daily (# 15) (last filled on 03/03/2024) Most recently prescribed controlled substance(s): Opioid Analgesic: None MME/day: 0 mg/day  Historical Monitoring: The patient  reports no history of drug use. List of prior UDS Testing: No results found for: MDMA, COCAINSCRNUR, PCPSCRNUR, PCPQUANT, CANNABQUANT, THCU, ETH, CBDTHCR, D8THCCBX, D9THCCBX Historical Background Evaluation: [place] PMP: PDMP reviewed during this encounter. Review of the past 67-months conducted.             PMP NARX Score Report:  Narcotic: 270 Sedative:  401 Stimulant: 000 [place] Department of public safety, offender search: Engineer, mining Information) Non-contributory Risk Assessment Profile: Aberrant behavior: None observed or detected today Risk factors for fatal opioid overdose: None identified today PMP NARX Overdose Risk Score: 080 Fatal overdose hazard ratio (HR): Calculation deferred Non-fatal overdose hazard ratio (HR): Calculation deferred Risk of opioid abuse or dependence: 0.7-3.0% with doses <= 36 MME/day and 6.1-26% with doses >= 120 MME/day. Substance use disorder (SUD) risk level: See below Personal History of Substance Abuse (SUD-Substance use disorder):  Alcohol :    Illegal Drugs:    Rx Drugs:    ORT Risk Level calculation:    ORT Scoring interpretation table:  Score <3 = Low Risk for SUD  Score between 4-7 = Moderate Risk for SUD  Score >8 = High Risk for Opioid Abuse   PHQ-2 Depression Scale:  Total score:    PHQ-2 Scoring interpretation table: (Score and probability of major depressive disorder)  Score 0 = No depression  Score 1 = 15.4% Probability  Score 2 = 21.1% Probability  Score 3 = 38.4% Probability  Score 4 = 45.5% Probability  Score 5 = 56.4% Probability  Score 6 = 78.6% Probability   PHQ-9 Depression Scale:  Total score:    PHQ-9 Scoring interpretation table:  Score 0-4 = No depression  Score 5-9 = Mild depression  Score 10-14 = Moderate depression  Score 15-19 = Moderately severe depression  Score 20-27 = Severe depression (2.4 times higher risk of SUD and 2.89 times higher risk of overuse)   Pharmacologic Plan: As per protocol, I have not taken over any controlled substance management, pending the results of ordered tests and/or consults.            Initial impression: Pending review of available data and ordered tests.  Meds   Current Outpatient Medications:    acetaminophen  (TYLENOL ) 500 MG tablet, Take by mouth as needed., Disp: , Rfl:    albuterol  (PROVENTIL ) (2.5 MG/3ML) 0.083% nebulizer  solution, Take 3 mLs (2.5 mg total) by nebulization every 6 (six) hours as needed for wheezing or shortness of breath., Disp: 75 mL, Rfl: 6  albuterol  (VENTOLIN  HFA) 108 (90 Base) MCG/ACT inhaler, Inhale 2 puffs into the lungs every 6 (six) hours as needed for wheezing or shortness of breath., Disp: 1 each, Rfl: 2   ALPRAZolam  (XANAX ) 0.5 MG tablet, Take 1 tablet (0.5 mg total) by mouth 3 (three) times daily as needed for anxiety. for anxiety, Disp: 90 tablet, Rfl: 2   amitriptyline  (ELAVIL ) 25 MG tablet, Take 1 tablet (25 mg total) by mouth at bedtime., Disp: 90 tablet, Rfl: 1   Ascorbic Acid (VITAMIN C) 100 MG tablet, Take 100 mg by mouth daily.  (Patient not taking: Reported on 03/07/2024), Disp: , Rfl:    azelastine  (ASTELIN ) 0.1 % nasal spray, Place 2 sprays into both nostrils 2 (two) times daily. Use in each nostril as directed, Disp: 30 mL, Rfl: 4   carisoprodol  (SOMA ) 350 MG tablet, Take 0.5 tablets (175 mg total) by mouth at bedtime. (Patient taking differently: Take 175 mg by mouth as needed.), Disp: 45 tablet, Rfl: 2   cyanocobalamin  (VITAMIN B12) 1000 MCG/ML injection, Inject 1 mL (1,000 mcg total) into the muscle every 30 (thirty) days., Disp: 3 mL, Rfl: 1   Dentifrices (BIOTENE DRY MOUTH DT), by Transmucosal route daily., Disp: , Rfl:    diphenhydrAMINE  HCl (BENADRYL  PO), Take 12.5 mg by mouth., Disp: , Rfl:    eletriptan  (RELPAX ) 40 MG tablet, Take 1 tablet (40 mg total) by mouth daily as needed for migraine. may repeat in 2 hours if necessary, Disp: 10 tablet, Rfl: 5   estradiol  (CLIMARA  - DOSED IN MG/24 HR) 0.05 mg/24hr patch, APPLY 1 PATCH TOPICALLY TO SKIN ONCE A WEEK, Disp: 12 patch, Rfl: 3   Estradiol  (YUVAFEM ) 10 MCG TABS vaginal tablet, INSERT 1 TABLET VAGINALLY TWICE A WEEK, Disp: 24 tablet, Rfl: 3   ezetimibe  (ZETIA ) 10 MG tablet, Take 1 tablet (10 mg total) by mouth daily., Disp: 90 tablet, Rfl: 3   fenofibrate  (TRICOR ) 145 MG tablet, Take 1 tablet (145 mg total) by mouth  daily., Disp: 90 tablet, Rfl: 1   fluticasone  (FLONASE ) 50 MCG/ACT nasal spray, Place 2 sprays into both nostrils daily., Disp: 16 g, Rfl: 3   fluticasone  furoate-vilanterol (BREO ELLIPTA ) 100-25 MCG/ACT AEPB, Inhale 1 puff into the lungs daily., Disp: 180 each, Rfl: 3   folic acid  (FOLVITE ) 1 MG tablet, Take 1 tablet (1 mg total) by mouth daily., Disp: 90 tablet, Rfl: 1   gabapentin  (NEURONTIN ) 600 MG tablet, Take 1/2 (one-half) tablet by mouth daily, Disp: 45 tablet, Rfl: 3   HYDROcodone -acetaminophen  (NORCO) 7.5-325 MG tablet, One tab po tid for pain prn only (Patient taking differently: as needed. One tab po tid for pain prn only), Disp: 30 tablet, Rfl: 0   hydroxychloroquine  (PLAQUENIL ) 200 MG tablet, Take 1 tablet by mouth twice daily, Disp: 180 tablet, Rfl: 0   hyoscyamine  (ANASPAZ ) 0.125 MG TBDP disintergrating tablet, Place 0.125 mg under the tongue every 4 (four) hours as needed for bladder spasms. , Disp: , Rfl:    Lactobacillus (DIGESTIVE HEALTH PROBIOTIC PO), Take 1 tablet by mouth daily. , Disp: , Rfl:    levothyroxine  (SYNTHROID ) 137 MCG tablet, Take 1 tablet (137 mcg total) by mouth daily before breakfast., Disp: 90 tablet, Rfl: 3   LINZESS  290 MCG CAPS capsule, Take 1 capsule (290 mcg total) by mouth daily., Disp: 90 capsule, Rfl: 3   Magnesium  Oxide (NATRUL MAGNESIUM  PO), Take by mouth. , Disp: , Rfl:    montelukast  (SINGULAIR ) 10 MG tablet, TAKE 1 TABLET BY MOUTH  ONCE DAILY FOR ASTHMA, Disp: 90 tablet, Rfl: 3   Multiple Vitamins-Minerals (ZINC PO), Take by mouth daily., Disp: , Rfl:    mupirocin  ointment (BACTROBAN ) 2 %, Apply topically as needed (dermatitis)., Disp: 22 g, Rfl: 0   pantoprazole  (PROTONIX ) 40 MG tablet, Take 40 mg by mouth daily. , Disp: , Rfl:    Polyethyl Glycol-Propyl Glycol 0.4-0.3 % SOLN, Apply to eye. , Disp: , Rfl:    Probiotic Product (PHILLIPS COLON HEALTH) CAPS, Take by mouth. , Disp: , Rfl:    sodium chloride  (OCEAN) 0.65 % SOLN nasal spray, Place 1  spray into both nostrils as needed for congestion. , Disp: , Rfl:    SUMAtriptan -naproxen  (TREXIMET ) 85-500 MG tablet, Take 1 tablet by mouth daily as needed for migraine., Disp: 10 tablet, Rfl: 2   SYRINGE-NEEDLE, DISP, 3 ML (B-D 3CC LUER-LOK SYR 25GX5/8) 25G X 5/8 3 ML MISC, USE AS DIRECTED ONCE  A  MONTH, Disp: 5 each, Rfl: 0   valACYclovir  (VALTREX ) 1000 MG tablet, 2 grams by mouth every 12 hours for 2 days at the onset of a cold sore, Disp: 32 tablet, Rfl: 5   [START ON 05/16/2024] venlafaxine  XR (EFFEXOR -XR) 150 MG 24 hr capsule, Take 2 capsules (300 mg total) by mouth at bedtime., Disp: 180 capsule, Rfl: 1   Vitamin D , Ergocalciferol , (DRISDOL ) 1.25 MG (50000 UNIT) CAPS capsule, Take 1 capsule by mouth once a week, Disp: 12 capsule, Rfl: 0   vitamin E 1000 UNIT capsule, Take by mouth. , Disp: , Rfl:   Imaging Review  Wrist Imaging: Wrist-R DG Complete: Results for orders placed during the hospital encounter of 10/26/19 DG Wrist Complete Right  Narrative CLINICAL DATA:  Chronic bilateral hand and wrist pain.  EXAM: RIGHT WRIST - COMPLETE 3+ VIEW  COMPARISON:  Right hand today  FINDINGS: Advanced osteoarthritis changes at the 1st carpometacarpal joint with joint space narrowing and spurring. Remainder the joint spaces are maintained. No bony erosions. No acute bony abnormality. Specifically, no fracture, subluxation, or dislocation.  IMPRESSION: Advanced osteoarthritis changes of the 1st carpometacarpal joint. No acute bony abnormality.   Electronically Signed By: Franky Crease M.D. On: 10/26/2019 11:57  Wrist-L DG Complete: Results for orders placed during the hospital encounter of 10/26/19 DG Wrist Complete Left  Narrative CLINICAL DATA:  Bilateral hand and wrist pain.  EXAM: LEFT WRIST - COMPLETE 3+ VIEW  COMPARISON:  None.  FINDINGS: Advanced osteoarthritis changes at the 1st carpometacarpal joint with joint space narrowing, spurring. No bony erosions. No  acute bony abnormality. Specifically, no fracture, subluxation, or dislocation.  IMPRESSION: Osteoarthritis changes most pronounced at the 1st carpometacarpal joint. No acute bony abnormality.   Electronically Signed By: Franky Crease M.D. On: 10/26/2019 11:56  Hand Imaging: Hand-R DG Complete: Results for orders placed during the hospital encounter of 10/26/19 DG Hand Complete Right  Narrative CLINICAL DATA:  Bilateral hand and wrist pain for weeks.  EXAM: RIGHT HAND - COMPLETE 3+ VIEW  COMPARISON:  Right wrist today  FINDINGS: Advanced osteoarthritis at the 1st carpometacarpal joint with joint space narrowing and spurring. Early joint space narrowing and spurring in the IP joints. No acute bony abnormality. Specifically, no fracture, subluxation, or dislocation.  IMPRESSION: Osteoarthritis changes in the right hand and wrist, most pronounced at the 1st carpometacarpal joint. No acute bony abnormality.   Electronically Signed By: Franky Crease M.D. On: 10/26/2019 11:55  Hand-L DG Complete: Results for orders placed during the hospital encounter of 10/26/19 DG Hand  Complete Left  Narrative CLINICAL DATA:  Bilateral hand and wrist pain for several weeks  EXAM: LEFT HAND - COMPLETE 3+ VIEW  COMPARISON:  Wrist series today  FINDINGS: Advanced osteoarthritis changes at the 1st carpometacarpal joint with joint space narrowing, spurring. Early osteoarthritis changes in the IP joints with joint space narrowing and early spurring. No bony erosions. No acute bony abnormality. Specifically, no fracture, subluxation, or dislocation.  IMPRESSION: Osteoarthritis changes most pronounced at the 1st carpometacarpal joint. No acute bony abnormality.   Electronically Signed By: Franky Crease M.D. On: 10/26/2019 11:55  Complexity Note: Imaging results reviewed.                         ROS  Cardiovascular: {Hx; Cardiovascular History:210120525} Pulmonary or  Respiratory: {Hx; Pumonary and/or Respiratory History:210120523} Neurological: {Hx; Neurological:210120504} Psychological-Psychiatric: {Hx; Psychological-Psychiatric History:210120512} Gastrointestinal: {Hx; Gastrointestinal:210120527} Genitourinary: {Hx; Genitourinary:210120506} Hematological: {Hx; Hematological:210120510} Endocrine: {Hx; Endocrine history:210120509} Rheumatologic: {Hx; Rheumatological:210120530} Musculoskeletal: {Hx; Musculoskeletal:210120528} Work History: {Hx; Work history:210120514}  Allergies  Susan [name] is allergic to aspirin , keflex [cephalexin], azithromycin , barley grass, clarithromycin, metoclopramide, oat, oatmeal, other, rye grass flower pollen extract [gramineae pollens], vioxx [rofecoxib], wheat, wheat extract, celebrex [celecoxib], and metoclopramide hcl.  Laboratory Chemistry Profile   Renal Lab Results  Component Value Date   BUN 19 03/07/2024   CREATININE 0.96 03/07/2024   LABCREA 29 03/30/2023   BCR SEE NOTE: 03/07/2024   GFRAA 80 03/06/2021   GFRNONAA >60 10/23/2021   SPECGRAV 1.007 04/08/2023   PHUR 5.5 04/08/2023   PROTEINUR NEGATIVE 03/07/2024     Electrolytes Lab Results  Component Value Date   NA 141 03/07/2024   K 4.3 03/07/2024   CL 107 03/07/2024   CALCIUM 10.0 03/07/2024     Hepatic Lab Results  Component Value Date   AST 24 03/07/2024   ALT 28 03/07/2024   ALBUMIN 4.1 06/30/2023   ALKPHOS 73 06/30/2023   AMYLASE 14 01/21/2013   LIPASE 29 10/06/2013     ID No results found for: LYMEIGGIGMAB, HIV, SARSCOV2NAA, STAPHAUREUS, MRSAPCR, HCVAB, PREGTESTUR, RMSFIGG, QFVRPH1IGG, QFVRPH2IGG   Bone Lab Results  Component Value Date   VD25OH 36.9 06/30/2023     Endocrine Lab Results  Component Value Date   GLUCOSE 96 03/07/2024   GLUCOSEU NEGATIVE 03/07/2024   TSH 0.802 06/30/2023   FREET4 1.27 06/30/2023     Neuropathy Lab Results  Component Value Date   VITAMINB12 481 06/30/2023   FOLATE  18.8 06/30/2023     CNS No results found for: COLORCSF, APPEARCSF, RBCCOUNTCSF, WBCCSF, POLYSCSF, LYMPHSCSF, EOSCSF, PROTEINCSF, GLUCCSF, JCVIRUS, CSFOLI, IGGCSF, LABACHR, ACETBL   Inflammation (CRP: Acute  ESR: Chronic) Lab Results  Component Value Date   CRP 3.6 03/07/2024   ESRSEDRATE 14 03/07/2024     Rheumatology Lab Results  Component Value Date   RF <10 03/07/2024   ANA POSITIVE (A) 03/07/2024     Coagulation Lab Results  Component Value Date   LABPROT NOTE 03/30/2023   PLT 368 03/07/2024     Cardiovascular Lab Results  Component Value Date   CKTOTAL 44 01/31/2020   TROPONINI <0.03 06/03/2017   HGB 12.9 03/07/2024   HCT 38.7 03/07/2024     Screening No results found for: SARSCOV2NAA, COVIDSOURCE, STAPHAUREUS, MRSAPCR, HCVAB, HIV, PREGTESTUR   Cancer No results found for: CEA, CA125, LABCA2   Allergens No results found for: ALMOND, APPLE, ASPARAGUS, AVOCADO, BANANA, BARLEY, BASIL, BAYLEAF, GREENBEAN, LIMABEAN, WHITEBEAN, BEEFIGE, REDBEET, BLUEBERRY, BROCCOLI, CABBAGE, MELON, CARROT, CASEIN,  CASHEWNUT, CAULIFLOWER, CELERY     Note: Lab results reviewed.  PFSH  Drug: Susan [name]  reports no history of drug use. Alcohol :  reports no history of alcohol  use. Tobacco:  reports that she has never smoked. She has been exposed to tobacco smoke. She has never used smokeless tobacco. Medical:  has a past medical history of Arthritis, Asthma, Asthmatic bronchitis, BRCA negative (05/2014), Celiac disease, Celiac sprue, Chronic anxiety, Complication of anesthesia, COPD (chronic obstructive pulmonary disease) (HCC), Depression, Environmental allergies, Family history of breast cancer (05/2014), Fibromyalgia, Gastritis (10/08/2013), GERD (gastroesophageal reflux disease), Hyperlipemia, Hyperlipidemia, Hypothyroidism (acquired), Increased risk of breast cancer (05/2014), Increased risk of  breast cancer (05/2014), Migraine, Migraines, Nutcracker esophagus (10/06/2013), Pancreatitis, Sjogren's disease (HCC), Spastic colon, and Thyroid  cancer (HCC) (1990 and 1994). Family: family history includes Arthritis/Rheumatoid in her mother; Breast cancer in her maternal aunt, maternal aunt, maternal aunt, and maternal grandmother; Breast cancer (age of onset: 61) in her mother; Lung cancer in her maternal aunt.  Past Surgical History:  Procedure Laterality Date   ABDOMINAL HYSTERECTOMY  2006   BLADDER REPAIR     BREAST BIOPSY Left 02/24/2024   BREAST CYST ASPIRATION Left yrs ago   CHOLECYSTECTOMY  1989   COLONOSCOPY N/A 10/16/2021   Procedure: COLONOSCOPY;  Surgeon: Dessa Reyes ORN, MD;  Location: [facility] ENDOSCOPY;  Service: Endoscopy;  Laterality: N/A;   COLONOSCOPY WITH ESOPHAGOGASTRODUODENOSCOPY (EGD)     COLONOSCOPY WITH PROPOFOL  N/A 08/03/2017   Procedure: COLONOSCOPY WITH PROPOFOL ;  Surgeon: Gaylyn Gladis PENNER, MD;  Location: [facility] ENDOSCOPY;  Service: Endoscopy;  Laterality: N/A;   COLONOSCOPY WITH PROPOFOL  N/A 10/18/2018   Procedure: COLONOSCOPY WITH PROPOFOL ;  Surgeon: Gaylyn Gladis PENNER, MD;  Location: [facility] ENDOSCOPY;  Service: Endoscopy;  Laterality: N/A;   ESOPHAGOGASTRODUODENOSCOPY N/A 10/07/2013   Procedure: ESOPHAGOGASTRODUODENOSCOPY (EGD);  Surgeon: Margo LITTIE Haddock, MD;  Location: [facility] ENDO SUITE;  Service: Endoscopy;  Laterality: N/A;  patient received heparin  at 530am given phenergan  25mg  IV 30 minutes before   ESOPHAGOGASTRODUODENOSCOPY     x4   EYE SURGERY     lacrimal gland    FLEXIBLE BRONCHOSCOPY N/A 06/04/2016   Procedure: FLEXIBLE BRONCHOSCOPY;  Surgeon: Elfreda DELENA Bathe, MD;  Location: [facility] ORS;  Service: Pulmonary;  Laterality: N/A;   INCONTINENCE SURGERY  2004   NASAL SINUS SURGERY     THYROID  LOBECTOMY     TOTAL THYROIDECTOMY     Active Ambulatory Problems    Diagnosis Date Noted   Esophageal spasm 10/06/2013   Substernal precordial chest pain 10/06/2013    Chronic anxiety 10/06/2013   Gastroesophageal reflux disease without esophagitis 10/06/2013   Chronic asthma 10/06/2013   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   Anxiety and depression 10/06/2013   Fibromyalgia 10/06/2013   Hypothyroidism 10/06/2013   Chronic migraine without aura 10/06/2013   Bradycardia 10/07/2013   Gastritis 10/08/2013   Acute asthma exacerbation 10/14/2013   Acute respiratory failure (HCC) 10/14/2013   Vaginal atrophy 09/01/2017   Low grade squamous intraepith lesion on cytologic smear vagina (lgsil) 09/09/2017   Chronic obstructive pulmonary disease, unspecified (HCC) 12/04/2017   Shortness of breath 12/04/2017   Allergic rhinitis, unspecified 12/04/2017   Celiac disease 04/17/2015   Chronic right-sided low back pain without sciatica 01/05/2017   Gastroesophageal reflux disease with esophagitis 04/17/2015   Sjogren's disease (HCC) 04/17/2015   Primary Sjogren's syndrome (HCC) 01/05/2017   Chronic pain of both knees 03/01/2018   Encounter for general adult medical examination with abnormal findings 03/01/2018   Primary osteoarthritis of  both first carpometacarpal joints 03/01/2018   Generalized anxiety disorder 02/28/2019   Dysuria 04/03/2019   MDD (major depressive disorder), recurrent, 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/05/2019   Chronic wrist pain, right 11/02/2019   Chronic pain of left wrist 11/02/2019   Pain in joints of right hand 11/02/2019   Pain in joint of left hand 11/02/2019   Primary osteoarthritis of both hands 11/02/2019   OSA (obstructive sleep apnea) 03/26/2020   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 06/20/2020   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   Essential tremor 06/20/2020   Menopause 10/16/2020   Lymphadenopathy of head and neck 11/04/2020   Screening for osteoporosis 11/04/2020   Need for vaccination against Streptococcus pneumoniae using pneumococcal conjugate vaccine 13 11/04/2020   Hyperlipidemia 07/04/2022   Abdominal migraine, not intractable  07/04/2022   Lower abdominal pain 01/05/2024   Abnormal mouth sensation 12/31/2022   Change in bowel habit 01/05/2024   Irritable bowel syndrome with constipation 12/09/2023   Medication refill 12/31/2022   Osteoarthritis 07/04/2022   Polypharmacy 07/04/2022   Postoperative hypothyroidism 07/04/2022   Sjogren syndrome, unspecified (HCC) 07/04/2022   Encounter for chronic pain management 03/01/2018   Well adult exam 07/04/2022   Chronic pain syndrome 05/10/2024   Pharmacologic therapy 05/10/2024   Disorder of skeletal system 05/10/2024   Problems influencing health status 05/10/2024   Resolved Ambulatory Problems    Diagnosis Date Noted   Osteoarthritis of both hands 11/02/2019   Rheumatic joint disease (HCC) 06/20/2020   Past Medical History:  Diagnosis Date   Arthritis    Asthma    Asthmatic bronchitis    BRCA negative 05/2014   Celiac sprue    Complication of anesthesia    COPD (chronic obstructive pulmonary disease) (HCC)    Depression    Environmental allergies    Family history of breast cancer 05/2014   GERD (gastroesophageal reflux disease)    Hyperlipemia    Hypothyroidism (acquired)    Increased risk of breast cancer 05/2014   Increased risk of breast cancer 05/2014   Migraine    Migraines    Pancreatitis    Spastic colon    Thyroid  cancer (HCC) 1990 and 1994   Constitutional Exam  General appearance: Well nourished, well developed, and well hydrated. In no apparent acute distress There were no vitals filed for this visit. BMI Assessment: Estimated body mass index is 30.95 kg/m as calculated from the following:   Height as of 04/13/24: 5' 5 (1.651 m).   Weight as of 04/13/24: 186 lb (84.4 kg).  BMI interpretation table: BMI level Category Range association with higher incidence of chronic pain  <18 kg/m2 Underweight   18.5-24.9 kg/m2 Ideal body weight   25-29.9 kg/m2 Overweight Increased incidence by 20%  30-34.9 kg/m2 Obese (Class I) Increased  incidence by 68%  35-39.9 kg/m2 Severe obesity (Class II) Increased incidence by 136%  >40 kg/m2 Extreme obesity (Class III) Increased incidence by 254%   Patient's current BMI Ideal Body weight  There is no height or weight on file to calculate BMI. Patient weight not recorded   BMI Readings from Last 4 Encounters:  04/13/24 30.95 kg/m  03/07/24 30.79 kg/m  03/03/24 30.89 kg/m  11/05/23 30.69 kg/m   Wt Readings from Last 4 Encounters:  04/13/24 186 lb (84.4 kg)  03/07/24 185 lb (83.9 kg)  03/03/24 185 lb 9.6 oz (84.2 kg)  11/05/23 184 lb 6.4 oz (83.6 kg)    Psych/Mental status: Alert, oriented x 3 (person, place, & time)  Eyes: PERLA Respiratory: No evidence of acute respiratory distress  Assessment  Primary Diagnosis & Pertinent Problem List: The primary encounter diagnosis was Chronic pain syndrome. Diagnoses of Pharmacologic therapy, Disorder of skeletal system, and Problems influencing health status were also pertinent to this visit.  Visit Diagnosis (New problems to examiner): 1. Chronic pain syndrome   2. Pharmacologic therapy   3. Disorder of skeletal system   4. Problems influencing health status    Plan of Care (Initial workup plan)  Note: Susan [name] was reminded that as per protocol, today's visit has been an evaluation only. We have not taken over the patient's controlled substance management.  Problem-specific plan: Assessment and Plan            Lab Orders  No laboratory test(s) ordered today   Imaging Orders  No imaging studies ordered today   Referral Orders  No referral(s) requested today   Procedure Orders    No procedure(s) ordered today   Pharmacotherapy (current): Medications ordered:  No orders of the defined types were placed in this encounter.  Medications administered during this visit: Maiya B. Goodwill had no medications administered during this visit.   Analgesic Pharmacotherapy:  Opioid Analgesics: For patients  currently taking or requesting to take opioid analgesics, in accordance with [place]  Medical Board Guidelines, we will assess their risks and indications for the use of these substances. After completing our evaluation, we may offer recommendations, but we no longer take patients for medication management. The prescribing physician will ultimately decide, based on his/her training and level of comfort whether to adopt any of the recommendations, including whether or not to prescribe such medicines.  Membrane stabilizer: To be determined at a later time  Muscle relaxant: To be determined at a later time  NSAID: To be determined at a later time  Other analgesic(s): To be determined at a later time   Interventional management options: Susan [name] was informed that there is no guarantee that she would be a candidate for interventional therapies. The decision will be based on the results of diagnostic studies, as well as Susan [name]'s risk profile.  Procedure(s) under consideration:  Pending results of ordered studies     Interventional Therapies  Risk Factors  Considerations  Medical Comorbidities:     Planned  Pending:      Under consideration:   Pending   Completed: (Analgesic benefit)1  None at this time   Therapeutic  Palliative (PRN) options:   None established   Completed by other providers:   None reported  1(Analgesic benefit): Expressed in percentage (%). (Local anesthetic[LA] +/- sedation  L.A.Local Anesthetic  Steroid benefit  Ongoing benefit)   Provider-requested follow-up: No follow-ups on file.  Future Appointments  Date Time Provider Department Center  05/11/2024  1:00 PM Tanya Glisson, MD [facility] None  09/05/2024 11:15 AM Fernand Elfreda LABOR, MD [facility] None  09/14/2024 11:00 AM Vickey Mettle, MD [facility] None  09/14/2024  1:20 PM Liana Fish, NP [facility] None  04/17/2025  2:20 PM Liana Fish, NP [facility] None   I discussed the  assessment and treatment plan with the patient. The patient was provided an opportunity to ask questions and all were answered. The patient agreed with the plan and demonstrated an understanding of the instructions.  Patient advised to call back or seek an in-person evaluation if the symptoms or condition worsens.  Duration of encounter: *** minutes.  Total time on encounter, as per AMA guidelines included both the face-to-face and non-face-to-face  time personally spent by the physician and/or other qualified health care professional(s) on the day of the encounter (includes time in activities that require the physician or other qualified health care professional and does not include time in activities normally performed by clinical staff). Physician's time may include the following activities when performed: Preparing to see the patient (e.g., pre-charting review of records, searching for previously ordered imaging, lab work, and nerve conduction tests) Review of prior analgesic pharmacotherapies. Reviewing PMP Interpreting ordered tests (e.g., lab work, imaging, nerve conduction tests) Performing post-procedure evaluations, including interpretation of diagnostic procedures Obtaining and/or reviewing separately obtained history Performing a medically appropriate examination and/or evaluation Counseling and educating the patient/family/caregiver Ordering medications, tests, or procedures Referring and communicating with other health care professionals (when not separately reported) Documenting clinical information in the electronic or other health record Independently interpreting results (not separately reported) and communicating results to the patient/ family/caregiver Care coordination (not separately reported)  Note by: Eric DELENA Como, MD (TTS and AI technology used. I apologize for any typographical errors that were not detected and corrected.) Date: 05/11/2024; Time: 4:58 PM

## 2024-05-11 ENCOUNTER — Ambulatory Visit (HOSPITAL_BASED_OUTPATIENT_CLINIC_OR_DEPARTMENT_OTHER): Admitting: Pain Medicine

## 2024-05-11 ENCOUNTER — Ambulatory Visit
Admission: RE | Admit: 2024-05-11 | Discharge: 2024-05-11 | Disposition: A | Discharge status: Home / Self Care | Source: Ambulatory Visit | Attending: Pain Medicine | Admitting: Pain Medicine

## 2024-05-11 ENCOUNTER — Encounter: Payer: Self-pay | Admitting: Pain Medicine

## 2024-05-11 VITALS — BP 129/81 | HR 74 | Temp 97.9°F | Resp 16 | Ht 65.0 in | Wt 185.0 lb

## 2024-05-11 DIAGNOSIS — M899 Disorder of bone, unspecified: Secondary | ICD-10-CM | POA: Diagnosis not present

## 2024-05-11 DIAGNOSIS — Z79899 Other long term (current) drug therapy: Secondary | ICD-10-CM | POA: Diagnosis not present

## 2024-05-11 DIAGNOSIS — M25541 Pain in joints of right hand: Secondary | ICD-10-CM | POA: Diagnosis not present

## 2024-05-11 DIAGNOSIS — M5459 Other low back pain: Secondary | ICD-10-CM | POA: Insufficient documentation

## 2024-05-11 DIAGNOSIS — Z789 Other specified health status: Secondary | ICD-10-CM

## 2024-05-11 DIAGNOSIS — M180 Bilateral primary osteoarthritis of first carpometacarpal joints: Secondary | ICD-10-CM

## 2024-05-11 DIAGNOSIS — M19041 Primary osteoarthritis, right hand: Secondary | ICD-10-CM | POA: Diagnosis not present

## 2024-05-11 DIAGNOSIS — G8929 Other chronic pain: Secondary | ICD-10-CM | POA: Insufficient documentation

## 2024-05-11 DIAGNOSIS — G894 Chronic pain syndrome: Secondary | ICD-10-CM | POA: Diagnosis not present

## 2024-05-11 DIAGNOSIS — M79642 Pain in left hand: Secondary | ICD-10-CM | POA: Insufficient documentation

## 2024-05-11 DIAGNOSIS — M545 Low back pain, unspecified: Secondary | ICD-10-CM | POA: Insufficient documentation

## 2024-05-11 DIAGNOSIS — M25542 Pain in joints of left hand: Secondary | ICD-10-CM | POA: Insufficient documentation

## 2024-05-11 DIAGNOSIS — M25571 Pain in right ankle and joints of right foot: Secondary | ICD-10-CM

## 2024-05-11 DIAGNOSIS — M19042 Primary osteoarthritis, left hand: Secondary | ICD-10-CM

## 2024-05-11 DIAGNOSIS — M25532 Pain in left wrist: Secondary | ICD-10-CM | POA: Diagnosis not present

## 2024-05-11 DIAGNOSIS — M19032 Primary osteoarthritis, left wrist: Secondary | ICD-10-CM | POA: Diagnosis not present

## 2024-05-11 DIAGNOSIS — M79641 Pain in right hand: Secondary | ICD-10-CM | POA: Insufficient documentation

## 2024-05-11 DIAGNOSIS — M47816 Spondylosis without myelopathy or radiculopathy, lumbar region: Secondary | ICD-10-CM | POA: Diagnosis not present

## 2024-05-11 NOTE — Progress Notes (Signed)
 Safety precautions to be maintained throughout the outpatient stay will include: orient to surroundings, keep bed in low position, maintain call bell within reach at all times, provide assistance with transfer out of bed and ambulation.

## 2024-05-12 ENCOUNTER — Other Ambulatory Visit: Payer: Self-pay | Admitting: Nurse Practitioner

## 2024-05-12 DIAGNOSIS — E538 Deficiency of other specified B group vitamins: Secondary | ICD-10-CM

## 2024-05-12 DIAGNOSIS — E039 Hypothyroidism, unspecified: Secondary | ICD-10-CM

## 2024-05-12 LAB — MAGNESIUM: Magnesium: 2.1 mg/dL (ref 1.6–2.3)

## 2024-05-16 LAB — COMPLIANCE DRUG ANALYSIS, UR

## 2024-05-18 ENCOUNTER — Encounter: Payer: Self-pay | Admitting: Nurse Practitioner

## 2024-05-18 DIAGNOSIS — E559 Vitamin D deficiency, unspecified: Secondary | ICD-10-CM | POA: Insufficient documentation

## 2024-05-18 DIAGNOSIS — E538 Deficiency of other specified B group vitamins: Secondary | ICD-10-CM | POA: Insufficient documentation

## 2024-05-24 ENCOUNTER — Telehealth: Payer: Self-pay | Admitting: Nurse Practitioner

## 2024-05-24 NOTE — Telephone Encounter (Signed)
 Neurology referral sent via [employer] to Dr. Lane w/ [facility]. Lvm notifying patient. Gave telephone # [phone]

## 2024-05-31 DIAGNOSIS — G4733 Obstructive sleep apnea (adult) (pediatric): Secondary | ICD-10-CM | POA: Diagnosis not present

## 2024-06-19 NOTE — Progress Notes (Unsigned)
 PROVIDER NOTE: Interpretation of information contained herein should be left to medically-trained personnel. Specific patient instructions are provided elsewhere under Patient Instructions section of medical record. This document was created in part using AI and STT-dictation technology, any transcriptional errors that may result from this process are unintentional.  Patient: Susan [name]  Service: E/M   PCP: Liana Fish, NP  DOB: 02-08-1956  DOS: 06/20/2024  Provider: Eric DELENA Como, MD  MRN: [id number]  Delivery: Face-to-face  Specialty: Interventional Pain Management  Type: Established Patient  Setting: Ambulatory outpatient facility  Specialty designation: 09  Referring Prov.: Liana Fish, NP  Location: Outpatient office facility       Primary Reason(s) for Visit: Encounter for evaluation before starting new chronic pain management plan of care (Level of risk: moderate) CC: No chief complaint on file.  HPI  Susan [name] is a 68 y.o. year old, female patient, who comes today for a follow-up evaluation to review the test results and decide on a treatment plan. She has Substernal precordial chest pain; Chronic anxiety; Gastroesophageal reflux disease without esophagitis; Chronic asthma; Nutcracker esophagus; Anxiety and depression; Fibromyalgia; Hypothyroidism; Chronic migraine without aura; Vaginal atrophy; Chronic obstructive pulmonary disease, unspecified (HCC); Allergic rhinitis, unspecified; Celiac disease; Chronic right-sided low back pain without sciatica; Primary Sjogren's syndrome (HCC); Chronic knee pain (Bilateral); Osteoarthritis of first carpometacarpal joints (Bilateral); Generalized anxiety disorder; MDD (major depressive disorder), recurrent, in partial remission (HCC); Chronic wrist pain (Right); Chronic pain of left wrist; Chronic pain in joints of hands (Right); Chronic pain in joints of hand (Left); Osteoarthritis of hands (Bilateral); OSA (obstructive sleep apnea);  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; Primary fibrositis; Essential tremor; Menopause; Hyperlipidemia; Abnormal mouth sensation; Irritable bowel syndrome with constipation; Osteoarthritis; Polypharmacy; Postoperative hypothyroidism; Chronic pain syndrome; Disorder of skeletal system; Chronic low back pain (1ry area of Pain) (Midline) w/o sciatica; Chronic low back pain (Bilateral) w/o sciatica; Low back pain of over 3 months duration; Multifactorial low back pain; Mechanical low back pain; Lumbar facet joint pain (Bilateral); Chronic ankle pain (Right); Chronic hand pain (Bilateral); B12 deficiency; Folate deficiency; and Vitamin D  deficiency on their problem list. Her primarily concern today is the No chief complaint on file.  Pain Assessment: Location:     Radiating:   Onset:   Duration:   Quality:   Severity:  /10 (subjective, self-reported pain score)  Effect on ADL:   Timing:   Modifying factors:   BP:    HR:    [name] comes in today for a follow-up visit after her initial evaluation on 05/11/2024. Today we went over the results of her tests. These were explained in Layman's terms. During today's appointment we went over my diagnostic impression, as well as the proposed treatment plan.  Review of initial evaluation (05/11/2024): Susan [name] is a 68 year old female with chronic back pain who presents for further evaluation of her tremor. She is being referred to a neurologist for further evaluation of her tremor.   She experiences chronic back pain located in the center of her back, particularly in the upper part. The pain is constant and worsens with standing or carrying any weight. She cannot stand for long periods, such as during church services, and experiences significant discomfort when carrying items. She has not had any back surgeries or recent injections, but she did receive facet joint injections in the past, which provided relief at the time. She has not had any recent imaging  studies like MRIs or X-rays for her back.  She reports weakness in her legs, which she attributes to 'old age' and deconditioning. She can walk around a field with her dog a couple of times a day but struggles with long distances and carrying weight.   She experiences pain in her right foot and ankle, attributed to plantar fasciitis and heel pain. She received three steroid injections in the ankle area last year, administered by a podiatrist. The ankle remains swollen frequently.   She describes pain in both hands, with the right hand being worse. The pain is located over the thumb and index finger area and sometimes radiates to her jaw. She is right-handed and left-handed.   She has a history of Sjogren's disease, celiac disease, fibromyalgia, and osteoarthritis. She also reports a tremor that has been worsening over several years. She was tested for Parkinson's disease seven or eight years ago, which was negative. There is no known family history of Parkinson's disease, although her father had a slight tremor.   She does not smoke.  Review of diagnostic test ordered on 05/11/2024:  Diagnostic lab work: *** Diagnostic imaging: ***  Discussed the use of AI scribe software for clinical note transcription with the patient, who gave verbal consent to proceed.  History of Present Illness          Patient presented with interventional treatment options. Ms. Goodner was informed that I will not be providing medication management. Pharmacotherapy evaluation including recommendations may be offered, if specifically requested.   Controlled Substance Pharmacotherapy Assessment REMS (Risk Evaluation and Mitigation Strategy)  Opioid Analgesic: None MME/day: 0 mg/day   Pill Count: None expected due to no prior prescriptions written by our practice. No notes on file  Pharmacokinetics: Liberation and absorption (onset of action): WNL Distribution (time to peak effect): WNL Metabolism and  excretion (duration of action): WNL         Pharmacodynamics: Desired effects: Analgesia: Ms. Level reports >50% benefit. Functional ability: Patient reports that medication allows her to accomplish basic ADLs Clinically meaningful improvement in function (CMIF): Sustained CMIF goals met Perceived effectiveness: Described as relatively effective, allowing for increase in activities of daily living (ADL) Undesirable effects: Side-effects or Adverse reactions: None reported Monitoring: [place] PMP: PDMP reviewed during this encounter. Online review of the past 70-month period previously conducted. Not applicable at this point since we have not taken over the patient's medication management yet. List of other Serum/Urine Drug Screening Test(s):  No results found for: AMPHSCRSER, BARBSCRSER, BENZOSCRSER, COCAINSCRSER, COCAINSCRNUR, PCPSCRSER, THCSCRSER, THCU, CANNABQUANT, OPIATESCRSER, OXYSCRSER, PROPOXSCRSER, ETH, CBDTHCR, D8THCCBX, D9THCCBX List of all UDS test(s) done:  Lab Results  Component Value Date   SUMMARY FINAL 05/11/2024   Last UDS on record: Summary  Date Value Ref Range Status  05/11/2024 FINAL  Final    Comment:    ==================================================================== Compliance Drug Analysis, Ur ==================================================================== Test                             Result       Flag       Units  Drug Present and Declared for Prescription Verification   Alprazolam                      55           EXPECTED   ng/mg creat   Alpha-hydroxyalprazolam        272          EXPECTED   ng/mg  creat    Source of alprazolam  is a scheduled prescription medication. Alpha-    hydroxyalprazolam is an expected metabolite of alprazolam .    Gabapentin                      PRESENT      EXPECTED   Amitriptyline                   PRESENT      EXPECTED   Nortriptyline                  PRESENT      EXPECTED     Nortriptyline is an expected metabolite of amitriptyline .    Venlafaxine                     PRESENT      EXPECTED   Desmethylvenlafaxine           PRESENT      EXPECTED    Desmethylvenlafaxine is an expected metabolite of venlafaxine .  Drug Absent but Declared for Prescription Verification   Hydrocodone                     Not Detected UNEXPECTED ng/mg creat   Carisoprodol                    Not Detected UNEXPECTED   Acetaminophen                   Not Detected UNEXPECTED    Acetaminophen , as indicated in the declared medication list, is not    always detected even when used as directed.    Naproxen                        Not Detected UNEXPECTED   Diphenhydramine                 Not Detected UNEXPECTED ==================================================================== Test                      Result    Flag   Units      Ref Range   Creatinine              64               mg/dL      >=79 ==================================================================== Declared Medications:  The flagging and interpretation on this report are based on the  following declared medications.  Unexpected results may arise from  inaccuracies in the declared medications.   **Note: The testing scope of this panel includes these medications:   Alprazolam  (Xanax )  Amitriptyline  (Elavil )  Carisoprodol  (Soma )  Diphenhydramine  (Benadryl )  Gabapentin  (Neurontin )  Hydrocodone  (Norco)  Naproxen  (Treximet )  Venlafaxine  (Effexor )   **Note: The testing scope of this panel does not include small to  moderate amounts of these reported medications:   Acetaminophen  (Tylenol )  Acetaminophen  (Norco)   **Note: The testing scope of this panel does not include the  following reported medications:   Albuterol  (Proventil  HFA)  Azelastine  (Astelin )  Clindamycin (Cleocin)  Eletriptan  (Relpax )  Estradiol   Ezetimibe  (Zetia )  Fenofibrate  (TriCor )  Fluticasone  (Breo)  Fluticasone  (Flonase )  Folic Acid  (Folvite )   Hydroxychloroquine  (Plaquenil )  Hyoscamine (Anaspaz )  Levothyroxine  (Synthroid )  Linaclotide  (Linzess )  Magnesium  (Mag-Ox)  Montelukast  (Singulair )  Mouthwash  Multivitamin  Polyethylene Glycol  Probiotic  Sodium Chloride   Sumatriptan  (Treximet )  Topical  Triamcinolone  (Kenalog )  Valacyclovir  (Valtrex )  Vilanterol (Breo)  Vitamin B12  Vitamin D2 (Drisdol )  Vitamin E ==================================================================== For clinical consultation, please call [phone]. ====================================================================    UDS interpretation: No unexpected findings.          Medication Assessment Form: Not applicable. No opioids. Treatment compliance: Not applicable Risk Assessment Profile: Aberrant behavior: See initial evaluations. None observed or detected today Comorbid factors increasing risk of overdose: See initial evaluation. No additional risks detected today Opioid risk tool (ORT):     05/11/2024    1:18 PM  Opioid Risk   Alcohol  0  Illegal Drugs 0  Rx Drugs 0  Alcohol  0  Illegal Drugs 0  Rx Drugs 0  Age between 16-45 years  0  History of Preadolescent Sexual Abuse 0  Psychological Disease 0  Depression 0  Opioid Risk Tool Scoring 0  Opioid Risk Interpretation Low Risk    ORT Scoring interpretation table:  Score <3 = Low Risk for SUD  Score between 4-7 = Moderate Risk for SUD  Score >8 = High Risk for Opioid Abuse   Risk of substance use disorder (SUD): Low  Risk Mitigation Strategies:  Patient opioid safety counseling: No controlled substances prescribed. Patient-Prescriber Agreement (PPA): No agreement signed.  Controlled substance notification to other providers: None required. No opioid therapy.  Pharmacologic Plan: Non-opioid analgesic therapy offered. Interventional alternatives discussed.             Laboratory Chemistry Profile   Renal Lab Results  Component Value Date   BUN 19 03/07/2024    CREATININE 0.96 03/07/2024   LABCREA 29 03/30/2023   BCR SEE NOTE: 03/07/2024   GFRAA 80 03/06/2021   GFRNONAA >60 10/23/2021   SPECGRAV 1.007 04/08/2023   PHUR 5.5 04/08/2023   PROTEINUR NEGATIVE 03/07/2024     Electrolytes Lab Results  Component Value Date   NA 141 03/07/2024   K 4.3 03/07/2024   CL 107 03/07/2024   CALCIUM 10.0 03/07/2024   MG 2.1 05/11/2024     Hepatic Lab Results  Component Value Date   AST 24 03/07/2024   ALT 28 03/07/2024   ALBUMIN 4.1 06/30/2023   ALKPHOS 73 06/30/2023   AMYLASE 14 01/21/2013   LIPASE 29 10/06/2013     ID No results found for: LYMEIGGIGMAB, HIV, SARSCOV2NAA, STAPHAUREUS, MRSAPCR, HCVAB, PREGTESTUR, RMSFIGG, QFVRPH1IGG, QFVRPH2IGG   Bone Lab Results  Component Value Date   VD25OH 36.9 06/30/2023     Endocrine Lab Results  Component Value Date   GLUCOSE 96 03/07/2024   GLUCOSEU NEGATIVE 03/07/2024   TSH 0.802 06/30/2023   FREET4 1.27 06/30/2023     Neuropathy Lab Results  Component Value Date   VITAMINB12 481 06/30/2023   FOLATE 18.8 06/30/2023     CNS No results found for: COLORCSF, APPEARCSF, RBCCOUNTCSF, WBCCSF, POLYSCSF, LYMPHSCSF, EOSCSF, PROTEINCSF, GLUCCSF, JCVIRUS, CSFOLI, IGGCSF, LABACHR, ACETBL   Inflammation (CRP: Acute  ESR: Chronic) Lab Results  Component Value Date   CRP 3.6 03/07/2024   ESRSEDRATE 14 03/07/2024     Rheumatology Lab Results  Component Value Date   RF <10 03/07/2024   ANA POSITIVE (A) 03/07/2024     Coagulation Lab Results  Component Value Date   LABPROT NOTE 03/30/2023   PLT 368 03/07/2024     Cardiovascular Lab Results  Component Value Date   CKTOTAL 44 01/31/2020   TROPONINI <0.03 06/03/2017   HGB 12.9 03/07/2024   HCT 38.7 03/07/2024     Screening No results found for: SARSCOV2NAA, COVIDSOURCE, STAPHAUREUS, MRSAPCR, HCVAB, HIV, PREGTESTUR  Cancer No results found for: CEA, CA125, LABCA2    Allergens No results found for: ALMOND, APPLE, ASPARAGUS, AVOCADO, BANANA, BARLEY, BASIL, BAYLEAF, GREENBEAN, LIMABEAN, WHITEBEAN, BEEFIGE, REDBEET, BLUEBERRY, BROCCOLI, CABBAGE, MELON, CARROT, CASEIN, CASHEWNUT, CAULIFLOWER, CELERY     Note: Lab results reviewed.  Recent Diagnostic Imaging Review  Lumbosacral Imaging: Lumbar DG Bending views: Results for orders placed during the hospital encounter of 05/11/24 DG Lumbar Spine Complete W/Bend  Narrative CLINICAL DATA:  Low back pain  EXAM: LUMBAR SPINE - COMPLETE WITH BENDING VIEWS  COMPARISON:  CT abdomen pelvis 10/31/2021  FINDINGS: There is no evidence of lumbar spine fracture. Multilevel mild degenerative change of the spine. Alignment is normal. No change in alignment on flexion and extension views. Intervertebral disc spaces are maintained. Right upper quadrant surgical clips.  IMPRESSION: No acute displaced fracture or traumatic listhesis of the lumbar spine.   Electronically Signed By: Morgane  Naveau M.D. On: 05/17/2024 01:16  Wrist Imaging: Wrist-R DG Complete: Results for orders placed during the hospital encounter of 10/26/19 DG Wrist Complete Right  Narrative CLINICAL DATA:  Chronic bilateral hand and wrist pain.  EXAM: RIGHT WRIST - COMPLETE 3+ VIEW  COMPARISON:  Right hand today  FINDINGS: Advanced osteoarthritis changes at the 1st carpometacarpal joint with joint space narrowing and spurring. Remainder the joint spaces are maintained. No bony erosions. No acute bony abnormality. Specifically, no fracture, subluxation, or dislocation.  IMPRESSION: Advanced osteoarthritis changes of the 1st carpometacarpal joint. No acute bony abnormality.   Electronically Signed By: Franky Crease M.D. On: 10/26/2019 11:57  Wrist-L DG Complete: Results for orders placed during the hospital encounter of 05/11/24 DG Wrist Complete Left  Narrative CLINICAL DATA:   Chronic bilateral hand pain  EXAM: LEFT WRIST - COMPLETE 3+ VIEW  COMPARISON:  None Available.  FINDINGS: No acute fracture or dislocation. Moderate to severe joint space loss of the first carpometacarpal and triscaphe joints. Soft tissues are unremarkable.  IMPRESSION: 1. No acute fracture or dislocation. 2. Moderate to severe osteoarthritis at the base of the thumb.   Electronically Signed By: Rogelia Myers M.D. On: 05/11/2024 17:49  Hand Imaging: Hand-R DG Complete: Results for orders placed during the hospital encounter of 05/11/24 DG Hand Complete Right  Narrative CLINICAL DATA:  Chronic right hand pain  EXAM: RIGHT HAND - COMPLETE 3+ VIEW  COMPARISON:  October 25, 2021  FINDINGS: No acute fracture or dislocation. Moderate joint space loss of the triscaphe and first carpometacarpal joints. Mild joint space loss throughout the DIP joints of the second through fifth digits. Soft tissues are unremarkable. No radiopaque foreign body.  IMPRESSION: 1. Osteoarthritis of the 2-5 digits and base of the thumb. 2. No acute fracture or dislocation.   Electronically Signed By: Rogelia Myers M.D. On: 05/11/2024 17:48  Hand-L DG Complete: Results for orders placed during the hospital encounter of 05/11/24 DG Hand Complete Left  Narrative CLINICAL DATA:  Chronic left hand pain  EXAM: LEFT HAND - COMPLETE 3+ VIEW  COMPARISON:  None Available.  FINDINGS: No acute fracture or dislocation. Redemonstrated osteoarthritis of the base of the thumb. Soft tissues are unremarkable. No radiopaque foreign body.  IMPRESSION: No acute fracture or dislocation.   Electronically Signed By: Rogelia Myers M.D. On: 05/11/2024 17:50  Complexity Note: Imaging results reviewed.                         Meds   Current Outpatient Medications:    acetaminophen  (TYLENOL )  500 MG tablet, Take by mouth as needed., Disp: , Rfl:    albuterol  (PROVENTIL ) (2.5 MG/3ML) 0.083%  nebulizer solution, Take 3 mLs (2.5 mg total) by nebulization every 6 (six) hours as needed for wheezing or shortness of breath., Disp: 75 mL, Rfl: 6   albuterol  (VENTOLIN  HFA) 108 (90 Base) MCG/ACT inhaler, Inhale 2 puffs into the lungs every 6 (six) hours as needed for wheezing or shortness of breath., Disp: 1 each, Rfl: 2   ALPRAZolam  (XANAX ) 0.5 MG tablet, Take 1 tablet (0.5 mg total) by mouth 3 (three) times daily as needed for anxiety. for anxiety, Disp: 90 tablet, Rfl: 2   amitriptyline  (ELAVIL ) 25 MG tablet, Take 1 tablet (25 mg total) by mouth at bedtime., Disp: 90 tablet, Rfl: 1   antiseptic oral rinse (BIOTENE) LIQD, 15 mLs by Mouth Rinse route as needed for dry mouth., Disp: , Rfl:    azelastine  (ASTELIN ) 0.1 % nasal spray, Place 2 sprays into both nostrils 2 (two) times daily. Use in each nostril as directed, Disp: 30 mL, Rfl: 4   carisoprodol  (SOMA ) 350 MG tablet, Take 0.5 tablets (175 mg total) by mouth at bedtime., Disp: 45 tablet, Rfl: 2   clindamycin (CLEOCIN T) 1 % lotion, Apply 1 Application topically at bedtime., Disp: , Rfl:    cyanocobalamin  (VITAMIN B12) 1000 MCG/ML injection, Inject 1 mL (1,000 mcg total) into the muscle every 30 (thirty) days., Disp: 3 mL, Rfl: 1   Dentifrices (BIOTENE DRY MOUTH DT), by Transmucosal route daily., Disp: , Rfl:    diphenhydrAMINE  HCl (BENADRYL  PO), Take 12.5 mg by mouth., Disp: , Rfl:    eletriptan  (RELPAX ) 40 MG tablet, Take 1 tablet (40 mg total) by mouth daily as needed for migraine. may repeat in 2 hours if necessary, Disp: 10 tablet, Rfl: 5   estradiol  (CLIMARA  - DOSED IN MG/24 HR) 0.05 mg/24hr patch, APPLY 1 PATCH TOPICALLY TO SKIN ONCE A WEEK, Disp: 12 patch, Rfl: 3   Estradiol  (YUVAFEM ) 10 MCG TABS vaginal tablet, INSERT 1 TABLET VAGINALLY TWICE A WEEK, Disp: 24 tablet, Rfl: 3   ezetimibe  (ZETIA ) 10 MG tablet, Take 1 tablet (10 mg total) by mouth daily., Disp: 90 tablet, Rfl: 3   fenofibrate  (TRICOR ) 145 MG tablet, Take 1 tablet (145 mg  total) by mouth daily., Disp: 90 tablet, Rfl: 1   fluticasone  (FLONASE ) 50 MCG/ACT nasal spray, Place 2 sprays into both nostrils daily., Disp: 16 g, Rfl: 3   fluticasone  furoate-vilanterol (BREO ELLIPTA ) 100-25 MCG/ACT AEPB, Inhale 1 puff into the lungs daily., Disp: 180 each, Rfl: 3   folic acid  (FOLVITE ) 1 MG tablet, Take 1 tablet (1 mg total) by mouth daily., Disp: 90 tablet, Rfl: 1   gabapentin  (NEURONTIN ) 600 MG tablet, Take 1/2 (one-half) tablet by mouth daily, Disp: 45 tablet, Rfl: 3   HYDROcodone -acetaminophen  (NORCO) 7.5-325 MG tablet, One tab po tid for pain prn only, Disp: 30 tablet, Rfl: 0   hydroxychloroquine  (PLAQUENIL ) 200 MG tablet, Take 1 tablet by mouth twice daily, Disp: 180 tablet, Rfl: 0   hyoscyamine  (ANASPAZ ) 0.125 MG TBDP disintergrating tablet, Place 0.125 mg under the tongue every 4 (four) hours as needed for bladder spasms. , Disp: , Rfl:    Lactobacillus (DIGESTIVE HEALTH PROBIOTIC PO), Take 1 tablet by mouth daily. , Disp: , Rfl:    levothyroxine  (SYNTHROID ) 137 MCG tablet, TAKE 1 TABLET BY MOUTH ONCE DAILY BEFORE BREAKFAST - DISCONTINUE THE 125 MCG DOSE., Disp: 90 tablet, Rfl: 0   LINZESS  290  MCG CAPS capsule, Take 1 capsule (290 mcg total) by mouth daily., Disp: 90 capsule, Rfl: 3   Magnesium  Oxide (NATRUL MAGNESIUM  PO), Take by mouth. , Disp: , Rfl:    montelukast  (SINGULAIR ) 10 MG tablet, TAKE 1 TABLET BY MOUTH ONCE DAILY FOR ASTHMA, Disp: 90 tablet, Rfl: 3   Multiple Vitamins-Minerals (ZINC PO), Take by mouth daily., Disp: , Rfl:    mupirocin  ointment (BACTROBAN ) 2 %, Apply topically as needed (dermatitis)., Disp: 22 g, Rfl: 0   pantoprazole  (PROTONIX ) 40 MG tablet, Take 40 mg by mouth daily. , Disp: , Rfl:    Polyethyl Glycol-Propyl Glycol 0.4-0.3 % SOLN, Apply to eye. , Disp: , Rfl:    Propylene Glycol (SYSTANE COMPLETE OP), Apply 1 drop to eye as needed (dry eyes)., Disp: , Rfl:    sodium chloride  (OCEAN) 0.65 % SOLN nasal spray, Place 1 spray into both  nostrils as needed for congestion. , Disp: , Rfl:    SUMAtriptan -naproxen  (TREXIMET ) 85-500 MG tablet, Take 1 tablet by mouth daily as needed for migraine., Disp: 10 tablet, Rfl: 2   SYRINGE-NEEDLE, DISP, 3 ML (B-D 3CC LUER-LOK SYR 25GX5/8) 25G X 5/8 3 ML MISC, USE  SYRINGE ONCE EVERY MONTH AS DIRECTED, Disp: 1 each, Rfl: 0   triamcinolone  cream (KENALOG ) 0.5 %, Apply 1 Application topically 3 (three) times daily., Disp: , Rfl:    valACYclovir  (VALTREX ) 1000 MG tablet, 2 grams by mouth every 12 hours for 2 days at the onset of a cold sore, Disp: 32 tablet, Rfl: 5   venlafaxine  XR (EFFEXOR -XR) 150 MG 24 hr capsule, Take 2 capsules (300 mg total) by mouth at bedtime., Disp: 180 capsule, Rfl: 1   Vitamin D , Ergocalciferol , (DRISDOL ) 1.25 MG (50000 UNIT) CAPS capsule, Take 1 capsule by mouth once a week, Disp: 12 capsule, Rfl: 0   vitamin E 1000 UNIT capsule, Take by mouth. , Disp: , Rfl:   ROS  Constitutional: Denies any fever or chills Gastrointestinal: No reported hemesis, hematochezia, vomiting, or acute GI distress Musculoskeletal: Denies any acute onset joint swelling, redness, loss of ROM, or weakness Neurological: No reported episodes of acute onset apraxia, aphasia, dysarthria, agnosia, amnesia, paralysis, loss of coordination, or loss of consciousness  Allergies  Ms. Rueger is allergic to aspirin , keflex [cephalexin], azithromycin , barley grass, clarithromycin, metoclopramide, oat, oatmeal, other, rye grass flower pollen extract [gramineae pollens], vioxx [rofecoxib], wheat, wheat extract, celebrex [celecoxib], and metoclopramide hcl.  PFSH  Drug: Ms. Xiao  reports no history of drug use. Alcohol :  reports no history of alcohol  use. Tobacco:  reports that she has never smoked. She has been exposed to tobacco smoke. She has never used smokeless tobacco. Medical:  has a past medical history of Arthritis, Asthma, Asthmatic bronchitis, BRCA negative (05/2014), Celiac disease, Celiac  sprue, Chronic anxiety, Complication of anesthesia, COPD (chronic obstructive pulmonary disease) (HCC), Depression, Environmental allergies, Family history of breast cancer (05/2014), Fibromyalgia, Gastritis (10/08/2013), GERD (gastroesophageal reflux disease), Hyperlipemia, Hyperlipidemia, Hypothyroidism (acquired), Increased risk of breast cancer (05/2014), Increased risk of breast cancer (05/2014), Migraine, Migraines, Nutcracker esophagus (10/06/2013), Pancreatitis, Sjogren's disease (HCC), Spastic colon, and Thyroid  cancer (HCC) (1990 and 1994). Surgical: Ms. Frieson  has a past surgical history that includes Cholecystectomy (1989); Abdominal hysterectomy (2006); Thyroid  lobectomy; Bladder repair; Nasal sinus surgery; Esophagogastroduodenoscopy (N/A, 10/07/2013); Flexible bronchoscopy (N/A, 06/04/2016); Breast cyst aspiration (Left, yrs ago); Colonoscopy with esophagogastroduodenoscopy (egd); Colonoscopy with propofol  (N/A, 08/03/2017); Total thyroidectomy; Incontinence surgery (2004); Eye surgery; Colonoscopy with propofol  (N/A, 10/18/2018); Esophagogastroduodenoscopy; Colonoscopy (N/A,  10/16/2021); and Breast biopsy (Left, 02/24/2024). Family: family history includes Arthritis/Rheumatoid in her mother; Breast cancer in her maternal aunt, maternal aunt, maternal aunt, and maternal grandmother; Breast cancer (age of onset: 60) in her mother; Lung cancer in her maternal aunt.  Constitutional Exam  General appearance: Well nourished, well developed, and well hydrated. In no apparent acute distress There were no vitals filed for this visit. BMI Assessment: Estimated body mass index is 30.79 kg/m as calculated from the following:   Height as of 05/11/24: 5' 5 (1.651 m).   Weight as of 05/11/24: 185 lb (83.9 kg).  BMI interpretation table: BMI level Category Range association with higher incidence of chronic pain  <18 kg/m2 Underweight   18.5-24.9 kg/m2 Ideal body weight   25-29.9 kg/m2 Overweight  Increased incidence by 20%  30-34.9 kg/m2 Obese (Class I) Increased incidence by 68%  35-39.9 kg/m2 Severe obesity (Class II) Increased incidence by 136%  >40 kg/m2 Extreme obesity (Class III) Increased incidence by 254%   Patient's current BMI Ideal Body weight  There is no height or weight on file to calculate BMI. Patient weight not recorded   BMI Readings from Last 4 Encounters:  05/11/24 30.79 kg/m  04/13/24 30.95 kg/m  03/07/24 30.79 kg/m  03/03/24 30.89 kg/m   Wt Readings from Last 4 Encounters:  05/11/24 185 lb (83.9 kg)  04/13/24 186 lb (84.4 kg)  03/07/24 185 lb (83.9 kg)  03/03/24 185 lb 9.6 oz (84.2 kg)    Psych/Mental status: Alert, oriented x 3 (person, place, & time)       Eyes: PERLA Respiratory: No evidence of acute respiratory distress  Assessment & Plan  Primary Diagnosis & Pertinent Problem List: The encounter diagnosis was Chronic low back pain (1ry area of Pain) (Midline) w/o sciatica. Visit Diagnosis: 1. Chronic low back pain (1ry area of Pain) (Midline) w/o sciatica    Problems updated and reviewed during this visit: No problems updated.  Plan of Care  Assessment and Plan             Pharmacotherapy (Medications Ordered): No orders of the defined types were placed in this encounter.  Procedure Orders    No procedure(s) ordered today   No orders of the defined types were placed in this encounter.  Lab Orders  No laboratory test(s) ordered today   Imaging Orders  No imaging studies ordered today   Referral Orders  No referral(s) requested today    Pharmacological management:  Opioid Analgesics: I will not be prescribing any opioids at this time Membrane stabilizer: I will not be prescribing any at this time Muscle relaxant: I will not be prescribing any at this time NSAID: I will not be prescribing any at this time Other analgesic(s): I will not be prescribing any at this time      Interventional Therapies  Risk Factors   Considerations  Medical Comorbidities:     Planned  Pending:      Under consideration:   Pending   Completed: (Analgesic benefit)1  None at this time   Therapeutic  Palliative (PRN) options:   None established   Completed by other providers:   None reported  1(Analgesic benefit): Expressed in percentage (%). (Local anesthetic[LA] +/- sedation  L.A.Local Anesthetic  Steroid benefit  Ongoing benefit)      Provider-requested follow-up: No follow-ups on file. Recent Visits Date Type Provider Dept  05/11/24 Office Visit Tanya Glisson, MD [facility]  Showing recent visits within past 90 days and meeting  all other requirements Future Appointments Date Type Provider Dept  06/20/24 Appointment Tanya Glisson, MD [facility]  Showing future appointments within next 90 days and meeting all other requirements   Primary Care Physician: Liana Fish, NP  Duration of encounter: *** minutes.  Total time on encounter, as per AMA guidelines included both the face-to-face and non-face-to-face time personally spent by the physician and/or other qualified health care professional(s) on the day of the encounter (includes time in activities that require the physician or other qualified health care professional and does not include time in activities normally performed by clinical staff). Physician's time may include the following activities when performed: Preparing to see the patient (e.g., pre-charting review of records, searching for previously ordered imaging, lab work, and nerve conduction tests) Review of prior analgesic pharmacotherapies. Reviewing PMP Interpreting ordered tests (e.g., lab work, imaging, nerve conduction tests) Performing post-procedure evaluations, including interpretation of diagnostic procedures Obtaining and/or reviewing separately obtained history Performing a medically appropriate examination and/or evaluation Counseling and  educating the patient/family/caregiver Ordering medications, tests, or procedures Referring and communicating with other health care professionals (when not separately reported) Documenting clinical information in the electronic or other health record Independently interpreting results (not separately reported) and communicating results to the patient/ family/caregiver Care coordination (not separately reported)  Note by: Glisson DELENA Tanya, MD (TTS technology used. I apologize for any typographical errors that were not detected and corrected.) Date: 06/20/2024; Time: 9:14 AM

## 2024-06-20 ENCOUNTER — Ambulatory Visit: Attending: Pain Medicine | Admitting: Pain Medicine

## 2024-06-20 VITALS — BP 128/89 | HR 72 | Temp 97.7°F | Resp 16 | Ht 65.0 in | Wt 185.0 lb

## 2024-06-20 DIAGNOSIS — M180 Bilateral primary osteoarthritis of first carpometacarpal joints: Secondary | ICD-10-CM | POA: Insufficient documentation

## 2024-06-20 DIAGNOSIS — M4826 Kissing spine, lumbar region: Secondary | ICD-10-CM | POA: Insufficient documentation

## 2024-06-20 DIAGNOSIS — M79641 Pain in right hand: Secondary | ICD-10-CM | POA: Diagnosis not present

## 2024-06-20 DIAGNOSIS — M25571 Pain in right ankle and joints of right foot: Secondary | ICD-10-CM | POA: Insufficient documentation

## 2024-06-20 DIAGNOSIS — M3500 Sicca syndrome, unspecified: Secondary | ICD-10-CM | POA: Insufficient documentation

## 2024-06-20 DIAGNOSIS — M797 Fibromyalgia: Secondary | ICD-10-CM | POA: Diagnosis not present

## 2024-06-20 DIAGNOSIS — M1811 Unilateral primary osteoarthritis of first carpometacarpal joint, right hand: Secondary | ICD-10-CM | POA: Insufficient documentation

## 2024-06-20 DIAGNOSIS — E538 Deficiency of other specified B group vitamins: Secondary | ICD-10-CM | POA: Diagnosis not present

## 2024-06-20 DIAGNOSIS — M5030 Other cervical disc degeneration, unspecified cervical region: Secondary | ICD-10-CM | POA: Insufficient documentation

## 2024-06-20 DIAGNOSIS — R29898 Other symptoms and signs involving the musculoskeletal system: Secondary | ICD-10-CM | POA: Insufficient documentation

## 2024-06-20 DIAGNOSIS — M79642 Pain in left hand: Secondary | ICD-10-CM | POA: Insufficient documentation

## 2024-06-20 DIAGNOSIS — G8929 Other chronic pain: Secondary | ICD-10-CM | POA: Insufficient documentation

## 2024-06-20 DIAGNOSIS — E559 Vitamin D deficiency, unspecified: Secondary | ICD-10-CM | POA: Insufficient documentation

## 2024-06-20 DIAGNOSIS — R5381 Other malaise: Secondary | ICD-10-CM | POA: Insufficient documentation

## 2024-06-20 DIAGNOSIS — M150 Primary generalized (osteo)arthritis: Secondary | ICD-10-CM | POA: Insufficient documentation

## 2024-06-20 DIAGNOSIS — M1812 Unilateral primary osteoarthritis of first carpometacarpal joint, left hand: Secondary | ICD-10-CM | POA: Insufficient documentation

## 2024-06-20 DIAGNOSIS — M545 Low back pain, unspecified: Secondary | ICD-10-CM | POA: Diagnosis not present

## 2024-06-20 DIAGNOSIS — M19041 Primary osteoarthritis, right hand: Secondary | ICD-10-CM | POA: Insufficient documentation

## 2024-06-20 DIAGNOSIS — R768 Other specified abnormal immunological findings in serum: Secondary | ICD-10-CM | POA: Insufficient documentation

## 2024-06-20 NOTE — Progress Notes (Signed)
 Safety precautions to be maintained throughout the outpatient stay will include: orient to surroundings, keep bed in low position, maintain call bell within reach at all times, provide assistance with transfer out of bed and ambulation.

## 2024-06-20 NOTE — Patient Instructions (Signed)
 ______________________________________________________________________    Procedure instructions  Stop blood-thinners  Do not eat or drink fluids (other than water) for 6 hours before your procedure  No water for 2 hours before your procedure  Take your blood pressure medicine with a sip of water  Arrive 30 minutes before your appointment  If sedation is planned, bring suitable driver. [place], [place], & public transportation are NOT APPROVED)  Carefully read the Preparing for your procedure detailed instructions  If you have questions call us  at [phone]  Procedure appointments are for procedures only.   NO medication refills or new problem evaluations will be done on procedure days.   Only the scheduled, pre-approved procedure and side will be done.   ______________________________________________________________________      ______________________________________________________________________    Preparing for your procedure  Appointments: If you think you may not be able to keep your appointment, call 24-48 hours in advance to cancel. We need time to make it available to others.  Procedure visits are for procedures only. During your procedure appointment there will be: NO Prescription Refills*. NO medication changes or discussions*. NO discussion of disability issues*. NO unrelated pain problem evaluations*. NO evaluations to order other pain procedures*. *These will be addressed at a separate and distinct evaluation encounter on the provider's evaluation schedule and not during procedure days.  Instructions: Food intake: Avoid eating anything solid for at least 8 hours prior to your procedure. Clear liquid intake: You may take clear liquids such as water up to 2 hours prior to your procedure. (No carbonated drinks. No soda.) Transportation: Unless otherwise stated by your physician, bring a driver. (Driver cannot be a Market researcher, Pharmacist, community, or any other form of public  transportation.) Morning Medicines: Except for blood thinners, take all of your other morning medications with a sip of water. Make sure to take your heart and blood pressure medicines. If your blood pressure's lower number is above 100, the case will be rescheduled. Blood thinners: Make sure to stop your blood thinners as instructed.  If you take a blood thinner, but were not instructed to stop it, call our office [phone] and ask to talk to a nurse. Not stopping a blood thinner prior to certain procedures could lead to serious complications. Diabetics on insulin : Notify the staff so that you can be scheduled 1st case in the morning. If your diabetes requires high dose insulin , take only  of your normal insulin  dose the morning of the procedure and notify the staff that you have done so. Preventing infections: Shower with an antibacterial soap the morning of your procedure.  Build-up your immune system: Take 1000 mg of Vitamin C with every meal (3 times a day) the day prior to your procedure. Antibiotics: Inform the nursing staff if you are taking any antibiotics or if you have any conditions that may require antibiotics prior to procedures. (Example: recent joint implants)   Pregnancy: If you are pregnant make sure to notify the nursing staff. Not doing so may result in injury to the fetus, including death.  Sickness: If you have a cold, fever, or any active infections, call and cancel or reschedule your procedure. Receiving steroids while having an infection may result in complications. Arrival: You must be in the facility at least 30 minutes prior to your scheduled procedure. Tardiness: Your scheduled time is also the cutoff time. If you do not arrive at least 15 minutes prior to your procedure, you will be rescheduled.  Children: Do not bring any children  with you. Make arrangements to keep them home. Dress appropriately: There is always a possibility that your clothing may get soiled. Avoid  long dresses. Valuables: Do not bring any jewelry or valuables.  Reasons to call and reschedule or cancel your procedure: (Following these recommendations will minimize the risk of a serious complication.) Surgeries: Avoid having procedures within 2 weeks of any surgery. (Avoid for 2 weeks before or after any surgery). Flu Shots: Avoid having procedures within 2 weeks of a flu shots or . (Avoid for 2 weeks before or after immunizations). Barium: Avoid having a procedure within 7-10 days after having had a radiological study involving the use of radiological contrast. (Myelograms, Barium swallow or enema study). Heart attacks: Avoid any elective procedures or surgeries for the initial 6 months after a Myocardial Infarction (Heart Attack). Blood thinners: It is imperative that you stop these medications before procedures. Let us  know if you if you take any blood thinner.  Infection: Avoid procedures during or within two weeks of an infection (including chest colds or gastrointestinal problems). Symptoms associated with infections include: Localized redness, fever, chills, night sweats or profuse sweating, burning sensation when voiding, cough, congestion, stuffiness, runny nose, sore throat, diarrhea, nausea, vomiting, cold or Flu symptoms, recent or current infections. It is specially important if the infection is over the area that we intend to treat. Heart and lung problems: Symptoms that may suggest an active cardiopulmonary problem include: cough, chest pain, breathing difficulties or shortness of breath, dizziness, ankle swelling, uncontrolled high or unusually low blood pressure, and/or palpitations. If you are experiencing any of these symptoms, cancel your procedure and contact your primary care physician for an evaluation.  Remember:  Regular Business hours are:  Monday to Thursday 8:00 AM to 4:00 PM  Provider's Schedule: Eric Como, MD:  Procedure days: Tuesday and Thursday 7:30  AM to 4:00 PM  Wallie Sherry, MD:  Procedure days: Monday and Wednesday 7:30 AM to 4:00 PM Last  Updated: 10/06/2023 ______________________________________________________________________      ______________________________________________________________________    General Risks and Possible Complications  Patient Responsibilities: It is important that you read this as it is part of your informed consent. It is our duty to inform you of the risks and possible complications associated with treatments offered to you. It is your responsibility as a patient to read this and to ask questions about anything that is not clear or that you believe was not covered in this document.  Patient's Rights: You have the right to refuse treatment. You also have the right to change your mind, even after initially having agreed to have the treatment done. However, under this last option, if you wait until the last second to change your mind, you may be charged for the materials used up to that point.  Introduction: Medicine is not an Visual merchandiser. Everything in Medicine, including the lack of treatment(s), carries the potential for danger, harm, or loss (which is by definition: Risk). In Medicine, a complication is a secondary problem, condition, or disease that can aggravate an already existing one. All treatments carry the risk of possible complications. The fact that a side effects or complications occurs, does not imply that the treatment was conducted incorrectly. It must be clearly understood that these can happen even when everything is done following the highest safety standards.  No treatment: You can choose not to proceed with the proposed treatment alternative. The "PRO(s)" would include: avoiding the risk of complications associated with the therapy. The "CON(s)"  would include: not getting any of the treatment benefits. These benefits fall under one of three categories: diagnostic; therapeutic; and/or  palliative. Diagnostic benefits include: getting information which can ultimately lead to improvement of the disease or symptom(s). Therapeutic benefits are those associated with the successful treatment of the disease. Finally, palliative benefits are those related to the decrease of the primary symptoms, without necessarily curing the condition (example: decreasing the pain from a flare-up of a chronic condition, such as incurable terminal cancer).  General Risks and Complications: These are associated to most interventional treatments. They can occur alone, or in combination. They fall under one of the following six (6) categories: no benefit or worsening of symptoms; bleeding; infection; nerve damage; allergic reactions; and/or death. No benefits or worsening of symptoms: In Medicine there are no guarantees, only probabilities. No healthcare provider can ever guarantee that a medical treatment will work, they can only state the probability that it may. Furthermore, there is always the possibility that the condition may worsen, either directly, or indirectly, as a consequence of the treatment. Bleeding: This is more common if the patient is taking a blood thinner, either prescription or over the counter (example: Goody Powders, Fish oil, Aspirin , Garlic, etc.), or if suffering a condition associated with impaired coagulation (example: Hemophilia, cirrhosis of the liver, low platelet counts, etc.). However, even if you do not have one on these, it can still happen. If you have any of these conditions, or take one of these drugs, make sure to notify your treating physician. Infection: This is more common in patients with a compromised immune system, either due to disease (example: diabetes, cancer, human immunodeficiency virus [HIV], etc.), or due to medications or treatments (example: therapies used to treat cancer and rheumatological diseases). However, even if you do not have one on these, it can still  happen. If you have any of these conditions, or take one of these drugs, make sure to notify your treating physician. Nerve Damage: This is more common when the treatment is an invasive one, but it can also happen with the use of medications, such as those used in the treatment of cancer. The damage can occur to small secondary nerves, or to large primary ones, such as those in the spinal cord and brain. This damage may be temporary or permanent and it may lead to impairments that can range from temporary numbness to permanent paralysis and/or brain death. Allergic Reactions: Any time a substance or material comes in contact with our body, there is the possibility of an allergic reaction. These can range from a mild skin rash (contact dermatitis) to a severe systemic reaction (anaphylactic reaction), which can result in death. Death: In general, any medical intervention can result in death, most of the time due to an unforeseen complication. ______________________________________________________________________      ______________________________________________________________________    Blood Thinners  IMPORTANT NOTICE:  If you take any of these, make sure to notify the nursing staff.  Failure to do so may result in serious injury.  Recommended time intervals to stop and restart blood-thinners, before & after invasive procedures  Generic Name Brand Name Pre-procedure: Stop medication for this amount of time before your procedure: Post-procedure: Wait this amount of time after the procedure before restarting your medication:  Abciximab Reopro 15 days 2 hrs  Alteplase Activase 10 days 10 days  Anagrelide Agrylin    Apixaban Eliquis 3 days 6 hrs  Cilostazol Pletal 3 days 5 hrs  Clopidogrel  Plavix  7-10 days 2  hrs  Dabigatran Pradaxa 5 days 6 hrs  Dalteparin Fragmin 24 hours 4 hrs  Dipyridamole Aggrenox 11days 2 hrs  Edoxaban Lixiana; Savaysa 3 days 2 hrs  Enoxaparin   Lovenox  24 hours 4 hrs   Eptifibatide Integrillin 8 hours 2 hrs  Fondaparinux  Arixtra 72 hours 12 hrs  Hydroxychloroquine Plaquenil 11 days   Prasugrel Effient 7-10 days 6 hrs  Reteplase Retavase 10 days 10 days  Rivaroxaban Xarelto 3 days 6 hrs  Ticagrelor Brilinta 5-7 days 6 hrs  Ticlopidine Ticlid 10-14 days 2 hrs  Tinzaparin Innohep 24 hours 4 hrs  Tirofiban Aggrastat 8 hours 2 hrs  Warfarin Coumadin 5 days 2 hrs   Other medications with blood-thinning effects  NOTE: Consider stopping these if you have prolonged bleeding despite not taking any of the above blood thinners. Otherwise ask your provider and this will be decided on a case-by-case basis.  Product indications Generic (Brand) names Note  Cholesterol Lipitor Stop 4 days before procedure  Blood thinner (injectable) Heparin  (LMW or LMWH Heparin ) Stop 24 hours before procedure  Cancer Ibrutinib (Imbruvica) Stop 7 days before procedure  Malaria/Rheumatoid Hydroxychloroquine (Plaquenil) Stop 11 days before procedure  Thrombolytics  10 days before or after procedures   Over-the-counter (OTC) Products with blood-thinning effects  NOTE: Consider stopping these if you have prolonged bleeding despite not taking any of the above blood thinners. Otherwise ask your provider and this will be decided on a case-by-case basis.  Product Common names Stop Time  Aspirin  > 325 mg Goody Powders, Excedrin, etc. 11 days  Aspirin  <= 81 mg  7 days  Fish oil  4 days  Garlic supplements  7 days  Ginkgo biloba  36 hours  Ginseng  24 hours  NSAIDs Ibuprofen , Naprosyn , etc. 3 days  Vitamin E  4 days   ______________________________________________________________________

## 2024-06-28 ENCOUNTER — Ambulatory Visit
Admission: RE | Admit: 2024-06-28 | Discharge: 2024-06-28 | Disposition: A | Discharge status: Home / Self Care | Source: Ambulatory Visit | Attending: Pain Medicine | Admitting: Pain Medicine

## 2024-06-28 ENCOUNTER — Ambulatory Visit (HOSPITAL_BASED_OUTPATIENT_CLINIC_OR_DEPARTMENT_OTHER): Admitting: Pain Medicine

## 2024-06-28 ENCOUNTER — Encounter: Payer: Self-pay | Admitting: Pain Medicine

## 2024-06-28 VITALS — BP 139/86 | HR 77 | Temp 98.3°F | Resp 16 | Ht 65.0 in | Wt 185.0 lb

## 2024-06-28 DIAGNOSIS — M4826 Kissing spine, lumbar region: Secondary | ICD-10-CM | POA: Diagnosis present

## 2024-06-28 DIAGNOSIS — G8929 Other chronic pain: Secondary | ICD-10-CM | POA: Diagnosis not present

## 2024-06-28 DIAGNOSIS — M549 Dorsalgia, unspecified: Secondary | ICD-10-CM | POA: Insufficient documentation

## 2024-06-28 DIAGNOSIS — M5459 Other low back pain: Secondary | ICD-10-CM | POA: Insufficient documentation

## 2024-06-28 DIAGNOSIS — M545 Low back pain, unspecified: Secondary | ICD-10-CM | POA: Diagnosis not present

## 2024-06-28 MED ORDER — LIDOCAINE HCL 2 % IJ SOLN
INTRAMUSCULAR | Status: AC
  Filled 2024-06-28: qty 20

## 2024-06-28 MED ORDER — METHYLPREDNISOLONE ACETATE 80 MG/ML IJ SUSP
80.0000 mg | Freq: Once | INTRAMUSCULAR | Status: AC
  Administered 2024-06-28: 80 mg via INTRA_ARTICULAR
  Filled 2024-06-28: qty 1

## 2024-06-28 MED ORDER — PENTAFLUOROPROP-TETRAFLUOROETH EX AERO
INHALATION_SPRAY | Freq: Once | CUTANEOUS | Status: AC
  Administered 2024-06-28: 30 via TOPICAL

## 2024-06-28 MED ORDER — ROPIVACAINE HCL 2 MG/ML IJ SOLN
9.0000 mL | Freq: Once | INTRAMUSCULAR | Status: AC
  Administered 2024-06-28: 9 mL
  Filled 2024-06-28: qty 20

## 2024-06-28 MED ORDER — LIDOCAINE HCL 2 % IJ SOLN
20.0000 mL | Freq: Once | INTRAMUSCULAR | Status: AC
  Administered 2024-06-28: 400 mg
  Filled 2024-06-28: qty 20

## 2024-06-28 NOTE — Progress Notes (Signed)
 PROVIDER NOTE: Interpretation of information contained herein should be left to medically-trained personnel. Specific patient instructions are provided elsewhere under Patient Instructions section of medical record. This document was created in part using STT-dictation technology, any transcriptional errors that may result from this process are unintentional.  Patient: Susan [name] Type: Established DOB: 18-Jul-1956 MRN: [id number] PCP: Liana Fish, NP  Service: Procedure DOS: 06/28/2024 Setting: Ambulatory Location: Ambulatory outpatient facility Delivery: Face-to-face Provider: Eric DELENA Como, MD Specialty: Interventional Pain Management Specialty designation: 09 Location: Outpatient facility Ref. Prov.: Liana Fish, NP       Interventional Therapy   Type: Ligament/Tendon insertion (79448) Injection. #1  Laterality: Midline  Level: L5-S1 Target: Interspinous ligament Location: Posterior spinal elements Approach: Percutaneous   Imaging: Fluoroscopic guidance Anesthesia: Local anesthesia (1-2% Lidocaine ) Anxiolysis: None                 Sedation: No Sedation                       DOS: 06/28/2024  Performed by: Eric DELENA Como, MD  Rationale (medical necessity): procedure needed and proper for the diagnosis and/or treatment of the patient's medical symptoms and needs. Purpose: Diagnostic/Therapeutic Indications: Posterior spinal enthesopathy (disorder of ligament or connective tissues. i.e.: tendons and/or ligaments) severe enough to impact quality of life or function. 1. Chronic low back pain (1ry area of Pain) (Midline) w/o sciatica   2. Low back pain of over 3 months duration   3. Lumbar interspinous bursitis   4. Multifactorial low back pain   5. Mechanical low back pain   6. Spinal column pain    NAS-11 Pain score:   Pre-procedure: 5 /10   Post-procedure: 3 /10      Position / Prep / Materials:  Position: Prone  Prep solution: ChloraPrep (2%  chlorhexidine gluconate and 70% isopropyl alcohol ) Prep Area: Entire Posterior Lumbosacral Region  Materials:  Tray: Block Needle(s):  Type: Spinal  Gauge (G): 22  Length: 3.5-in  Qty: 1  Pre-op H&P  Assessment  Time-out   H&P (Pre-op Assessment):  Susan [name] is a 68 y.o. (year old), female patient, seen today for interventional treatment. She  has a past surgical history that includes Cholecystectomy (1989); Abdominal hysterectomy (2006); Thyroid  lobectomy; Bladder repair; Nasal sinus surgery; Esophagogastroduodenoscopy (N/A, 10/07/2013); Flexible bronchoscopy (N/A, 06/04/2016); Breast cyst aspiration (Left, yrs ago); Colonoscopy with esophagogastroduodenoscopy (egd); Colonoscopy with propofol  (N/A, 08/03/2017); Total thyroidectomy; Incontinence surgery (2004); Eye surgery; Colonoscopy with propofol  (N/A, 10/18/2018); Esophagogastroduodenoscopy; Colonoscopy (N/A, 10/16/2021); and Breast biopsy (Left, 02/24/2024). Susan [name] has a current medication list which includes the following prescription(s): acetaminophen , albuterol , albuterol , alprazolam , amitriptyline , antiseptic oral rinse, azelastine , carisoprodol , clindamycin, cyanocobalamin , dentifrices, diphenhydramine  hcl, eletriptan , estradiol , yuvafem , ezetimibe , fenofibrate , fluticasone , fluticasone  furoate-vilanterol, folic acid , gabapentin , hydrocodone -acetaminophen , hydroxychloroquine , hyoscyamine , lactobacillus, levothyroxine , linzess , magnesium  oxide, montelukast , multiple vitamins-minerals, mupirocin  ointment, pantoprazole , polyethyl glycol-propyl glycol, propylene glycol, sodium chloride , sumatriptan -naproxen , b-d 3cc luer-lok syr 25gx5/8, triamcinolone  cream, valacyclovir , venlafaxine  xr, vitamin d  (ergocalciferol ), and vitamin e. Her primarily concern today is the Back Pain (Midline lumbar )  Initial Vital Signs:  Pulse/HCG Rate: 77ECG Heart Rate: 71 Temp: 98.3 F (36.8 C) Resp: 16 BP: 135/86 SpO2: 98 %  BMI: Estimated body  mass index is 30.79 kg/m as calculated from the following:   Height as of this encounter: 5' 5 (1.651 m).   Weight as of this encounter: 185 lb (83.9 kg).  Risk Assessment: Allergies: Reviewed. She is allergic to aspirin , keflex [cephalexin], azithromycin , barley  grass, clarithromycin, metoclopramide, oat, oatmeal, other, rye grass flower pollen extract [gramineae pollens], vioxx [rofecoxib], wheat, wheat extract, celebrex [celecoxib], and metoclopramide hcl.  Allergy  Precautions: None required Coagulopathies: Reviewed. None identified.  Blood-thinner therapy: None at this time Active Infection(s): Reviewed. None identified. Susan [name] is afebrile  Site Confirmation: Susan [name] was asked to confirm the procedure and laterality before marking the site Procedure checklist: Completed Consent: Before the procedure and under the influence of no sedative(s), amnesic(s), or anxiolytics, the patient was informed of the treatment options, risks and possible complications. To fulfill our ethical and legal obligations, as recommended by the American Medical Association's Code of Ethics, I have informed the patient of my clinical impression; the nature and purpose of the treatment or procedure; the risks, benefits, and possible complications of the intervention; the alternatives, including doing nothing; the risk(s) and benefit(s) of the alternative treatment(s) or procedure(s); and the risk(s) and benefit(s) of doing nothing. The patient was provided information about the general risks and possible complications associated with the procedure. These may include, but are not limited to: failure to achieve desired goals, infection, bleeding, organ or nerve damage, allergic reactions, paralysis, and death. In addition, the patient was informed of those risks and complications associated to the procedure, such as failure to decrease pain; infection; bleeding; organ or nerve damage with subsequent damage to  sensory, motor, and/or autonomic systems, resulting in permanent pain, numbness, and/or weakness of one or several areas of the body; allergic reactions; (i.e.: anaphylactic reaction); and/or death. Furthermore, the patient was informed of those risks and complications associated with the medications. These include, but are not limited to: allergic reactions (i.e.: anaphylactic or anaphylactoid reaction(s)); adrenal axis suppression; blood sugar elevation that in diabetics may result in ketoacidosis or comma; water  retention that in patients with history of congestive heart failure may result in shortness of breath, pulmonary edema, and decompensation with resultant heart failure; weight gain; swelling or edema; medication-induced neural toxicity; particulate matter embolism and blood vessel occlusion with resultant organ, and/or nervous system infarction; and/or aseptic necrosis of one or more joints. Finally, the patient was informed that Medicine is not an exact science; therefore, there is also the possibility of unforeseen or unpredictable risks and/or possible complications that may result in a catastrophic outcome. The patient indicated having understood very clearly. We have given the patient no guarantees and we have made no promises. Enough time was given to the patient to ask questions, all of which were answered to the patient's satisfaction. Susan [name] has indicated that she wanted to continue with the procedure. Attestation: I, the ordering provider, attest that I have discussed with the patient the benefits, risks, side-effects, alternatives, likelihood of achieving goals, and potential problems during recovery for the procedure that I have provided informed consent. Date  Time: 06/28/2024  1:19 PM  Pre-Procedure Preparation:  Monitoring: As per clinic protocol. Respiration, ETCO2, SpO2, BP, heart rate and rhythm monitor placed and checked for adequate function Safety Precautions: Patient was  assessed for positional comfort and pressure points before starting the procedure. Time-out: I initiated and conducted the Time-out before starting the procedure, as per protocol. The patient was asked to participate by confirming the accuracy of the Time Out information. Verification of the correct person, site, and procedure were performed and confirmed by me, the nursing staff, and the patient. Time-out conducted as per Joint Commission's Universal Protocol (UP.01.01.01). Time: 1355 Start Time: 1355 hrs.   Description  Start Time: 1355 hrs. Procedural Technique Safety  Precautions: Aspiration looking for blood return was conducted prior to all injections. At no point did we inject any substances, as a needle was being advanced. No attempts were made at seeking any paresthesias. Safe injection practices and needle disposal techniques used. Medications properly checked for expiration dates. SDV (single dose vial) medications used. Description of the Procedure: Protocol guidelines were followed. The patient was assisted into a comfortable position. The target area was identified and the area prepped in the usual manner. Skin & deeper tissues infiltrated with local anesthetic. Appropriate amount of time allowed to pass for local anesthetics to take effect. The procedure needles were then advanced to the target area. Proper needle placement secured. Negative aspiration confirmed. Solution injected in intermittent fashion, asking for systemic symptoms every 0.5cc of injectate. The needles were then removed and the area cleansed, making sure to leave some of the prepping solution back to take advantage of its long term bactericidal properties.         Vitals:   06/28/24 1312 06/28/24 1351 06/28/24 1356 06/28/24 1401  BP: 135/86 (!) 135/91 (!) 140/89 139/86  Pulse: 77     Resp: 16 15 16 16   Temp: 98.3 F (36.8 C)     TempSrc: Temporal     SpO2: 98% 98% 99% 100%  Weight: 185 lb (83.9 kg)      Height: 5' 5 (1.651 m)        End Time: 1358 hrs.  Imaging Guidance  Type of Imaging Technique: None used Indication(s): N/A Exposure Time: No patient exposure Contrast: None used. Fluoroscopic Guidance: N/A Ultrasound Guidance: N/A Interpretation: N/A  Post-operative Assessment:  Post-procedure Vital Signs:  Pulse/HCG Rate: 7772 Temp: 98.3 F (36.8 C) Resp: 16 BP: 139/86 SpO2: 100 %  EBL: None  Complications: No immediate post-treatment complications observed by team, or reported by patient.  Note: The patient tolerated the entire procedure well. A repeat set of vitals were taken after the procedure and the patient was kept under observation following institutional policy, for this type of procedure. Post-procedural neurological assessment was performed, showing return to baseline, prior to discharge. The patient was provided with post-procedure discharge instructions, including a section on how to identify potential problems. Should any problems arise concerning this procedure, the patient was given instructions to immediately contact us , at any time, without hesitation. In any case, we plan to contact the patient by telephone for a follow-up status report regarding this interventional procedure.  Comments:  No additional relevant information.  Plan of Care (POC)  Orders:  Orders Placed This Encounter  Procedures   Injection tendon or ligament    Scheduling Instructions:     Type of Block:  Interspinous process ligament injection     Side: Midline     Sedation: No Sedation.     Timeframe: 06/28/2024   [facility] C-ARM 1-60 MIN NO REPORT    Intraoperative interpretation by procedural physician at [facility].    Standing Status:   Standing    Number of Occurrences:   1    Reason for exam::   Assistance in needle guidance and placement for procedures requiring needle placement in or near specific anatomical locations not easily accessible without such  assistance.   Informed Consent Details: Physician/Practitioner Attestation; Transcribe to consent form and obtain patient signature    Note: Always confirm laterality of pain with Susan [name], before procedure.    Physician/Practitioner attestation of informed consent for procedure/surgical case:   I, the physician/practitioner, attest  that I have discussed with the patient the benefits, risks, side effects, alternatives, likelihood of achieving goals and potential problems during recovery for the procedure that I have provided informed consent.    Procedure:   Ligament/Tendon sheath/insertion injection    Physician/Practitioner performing the procedure:   Reily Treloar A. Travanti Mcmanus, MD    Indication/Reason:   Painful tendinitis/tendinopathy   Provide equipment / supplies at bedside    Procedure tray: Block Tray (Disposable  single use) Skin infiltration needle: Regular 1.5-in, 25-G, (x1) Block Needle type: Spinal Amount/quantity: 1 Size: Regular (3.5-inch) Gauge: 22G    Standing Status:   Standing    Number of Occurrences:   1    Specify:   Block Tray     Opioid Analgesic: No chronic opioid analgesics therapy prescribed by our practice. None MME/day: 0 mg/day    Medications ordered for procedure: Meds ordered this encounter  Medications   lidocaine  (XYLOCAINE ) 2 % (with pres) injection 400 mg   pentafluoroprop-tetrafluoroeth (GEBAUERS) aerosol   ropivacaine  (PF) 2 mg/mL (0.2%) (NAROPIN ) injection 9 mL   methylPREDNISolone  acetate (DEPO-MEDROL ) injection 80 mg   Medications administered: We administered lidocaine , pentafluoroprop-tetrafluoroeth, ropivacaine  (PF) 2 mg/mL (0.2%), and methylPREDNISolone  acetate.  See the medical record for exact dosing, route, and time of administration.    Interventional Therapies  Risk Factors  Considerations  Medical Comorbidities:  Sjgren's syndrome  B12 deficiency  vitamin D  deficiency  (+) ANA  COPD  BA  GERD  IBS  OSA  GAD      Planned  Pending:      Under consideration:   Possible therapeutic midline lumbar interspinous ligament/bursa RFA #1    Completed: (Analgesic benefit)1  Diagnostic/therapeutic midline L5-S1 lumbar interspinous ligament/bursa inj. x1 (06/28/2024)    Therapeutic  Palliative (PRN) options:   None established   Completed by other providers:   None reported  1(Analgesic benefit): Expressed in percentage (%). (Local anesthetic[LA] +/- sedation  L.A.Local Anesthetic  Steroid benefit  Ongoing benefit)     Follow-up plan:   Return in about 2 weeks (around 07/12/2024) for Eval-day (M,W), (Face2F), (PPE).     Recent Visits Date Type Provider Dept  06/20/24 Office Visit Tanya Glisson, MD [facility]  05/11/24 Office Visit Tanya Glisson, MD [facility]  Showing recent visits within past 90 days and meeting all other requirements Today's Visits Date Type Provider Dept  06/28/24 Procedure visit Tanya Glisson, MD [facility]  Showing today's visits and meeting all other requirements Future Appointments Date Type Provider Dept  07/18/24 Appointment Tanya Glisson, MD [facility]  Showing future appointments within next 90 days and meeting all other requirements   Disposition: Discharge home  Discharge (Date  Time): 06/28/2024; 1403 hrs.   Primary Care Physician: Liana Fish, NP Location: [facility] Note by: Glisson DELENA Tanya, MD (TTS technology used. I apologize for any typographical errors that were not detected and corrected.) Date: 06/28/2024; Time: 2:22 PM  Disclaimer:  Medicine is not an Visual merchandiser. The only guarantee in medicine is that nothing is guaranteed. It is important to note that the decision to proceed with this intervention was based on the information collected from the patient. The Data and conclusions were drawn from the patient's questionnaire, the interview, and  the physical examination. Because the information was provided in large part by the patient, it cannot be guaranteed that it has not been purposely or unconsciously manipulated. Every effort has been made to obtain as much  relevant data as possible for this evaluation. It is important to note that the conclusions that lead to this procedure are derived in large part from the available data. Always take into account that the treatment will also be dependent on availability of resources and existing treatment guidelines, considered by other Pain Management Practitioners as being common knowledge and practice, at the time of the intervention. For Medico-Legal purposes, it is also important to point out that variation in procedural techniques and pharmacological choices are the acceptable norm. The indications, contraindications, technique, and results of the above procedure should only be interpreted and judged by a Board-Certified Interventional Pain Specialist with extensive familiarity and expertise in the same exact procedure and technique.

## 2024-06-28 NOTE — Patient Instructions (Signed)

## 2024-06-28 NOTE — Progress Notes (Signed)
 Safety precautions to be maintained throughout the outpatient stay will include: orient to surroundings, keep bed in low position, maintain call bell within reach at all times, provide assistance with transfer out of bed and ambulation.

## 2024-06-29 ENCOUNTER — Telehealth: Payer: Self-pay | Admitting: *Deleted

## 2024-06-29 ENCOUNTER — Other Ambulatory Visit: Payer: Self-pay | Admitting: Nurse Practitioner

## 2024-06-29 DIAGNOSIS — E538 Deficiency of other specified B group vitamins: Secondary | ICD-10-CM

## 2024-06-29 DIAGNOSIS — Z760 Encounter for issue of repeat prescription: Secondary | ICD-10-CM

## 2024-06-29 DIAGNOSIS — F411 Generalized anxiety disorder: Secondary | ICD-10-CM

## 2024-06-29 NOTE — Telephone Encounter (Signed)
 Post procedure call; voicemail left

## 2024-06-30 ENCOUNTER — Telehealth: Payer: Self-pay | Admitting: Nurse Practitioner

## 2024-06-30 NOTE — Telephone Encounter (Signed)
 Neurology appointment 08/23/2024 @ [facility]

## 2024-07-17 NOTE — Progress Notes (Unsigned)
 PROVIDER NOTE: Interpretation of information contained herein should be left to medically-trained personnel. Specific patient instructions are provided elsewhere under Patient Instructions section of medical record. This document was created in part using AI and STT-dictation technology, any transcriptional errors that may result from this process are unintentional.  Patient: Susan [name]  Service: E/M   PCP: Liana Fish, NP  DOB: Oct 15, 1956  DOS: 07/18/2024  Provider: Eric DELENA Como, MD  MRN: [id number]  Delivery: Face-to-face  Specialty: Interventional Pain Management  Type: Established Patient  Setting: Ambulatory outpatient facility  Specialty designation: 09  Referring Prov.: Liana Fish, NP  Location: Outpatient office facility       History of present illness (HPI) Susan [name], a 68 y.o. year old female, is here today because of her Chronic midline low back pain without sciatica [M54.50, G89.29]. Susan [name] primary complain today is No chief complaint on file.  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; Primary fibrositis; Chronic pain syndrome; Chronic low back pain (1ry area of Pain) (Midline) w/o sciatica; Chronic low back pain (Bilateral) w/o sciatica; Low back pain of over 3 months duration; Multifactorial low back pain; Mechanical low back pain; Lumbar facet joint pain (Bilateral); Chronic ankle pain (Right); Chronic hand pain (4th area of Pain) (Bilateral); Osteoarthritis of carpometacarpal joint of thumb (Left); Arthritis of  carpometacarpal (CMC) joint of thumb (Left); Osteoarthritis of hand (Right); Osteoarthritis of first (thumb) carpometacarpal joint of hand (Right); ANA positive; Complaints of weakness of lower extremities (2ry area of Pain) (Bilateral); Chronic ankle and foot pain (3ry area of Pain) (Right); Physical deconditioning; DDD (degenerative disc disease), cervical; Osteoarthritis involving multiple joints; Lumbar interspinous bursitis; and Spinal column pain on their pertinent problem list.  Pain Assessment: Severity of   is reported as a  /10. Location:    / . Onset:  . Quality:  . Timing:  . Modifying factor(s):  SABRA Vitals:  vitals were not taken for this visit.  BMI: Estimated body mass index is 30.79 kg/m as calculated from the following:   Height as of 06/28/24: 5' 5 (1.651 m).   Weight as of 06/28/24: 185 lb (83.9 kg).  Last encounter: 06/20/2024. Last procedure: 06/28/2024.  Reason for encounter: post-procedure evaluation and assessment.   Discussed the use of AI scribe software for clinical note transcription with the patient, who gave verbal consent to proceed.  History of Present Illness          Post-Procedure Evaluation   Type: Ligament/Tendon insertion (79448) Injection. #1  Laterality: Midline  Level: L5-S1 Target: Interspinous ligament Location: Posterior spinal elements Approach: Percutaneous   Imaging: Fluoroscopic guidance Anesthesia: Local anesthesia (1-2% Lidocaine ) Anxiolysis: None                 Sedation: No Sedation                       DOS: 06/28/2024  Performed by: Eric DELENA Como, MD  Rationale (medical necessity): procedure needed and proper for the diagnosis and/or treatment of the patient's medical symptoms and needs. Purpose: Diagnostic/Therapeutic Indications: Posterior spinal enthesopathy (disorder of ligament or connective tissues. i.e.: tendons and/or  ligaments) severe enough to impact quality of life or function. 1. Chronic low back pain (1ry area of  Pain) (Midline) w/o sciatica   2. Low back pain of over 3 months duration   3. Lumbar interspinous bursitis   4. Multifactorial low back pain   5. Mechanical low back pain   6. Spinal column pain    NAS-11 Pain score:   Pre-procedure: 5 /10   Post-procedure: 3 /10     Effectiveness:  Initial hour after procedure:   ***. Subsequent 4-6 hours post-procedure:   ***. Analgesia past initial 6 hours:   ***. Ongoing improvement:  Analgesic:  *** Function:    ***    ROM:    ***     Pharmacotherapy Assessment   Opioid Analgesic: No chronic opioid analgesics therapy prescribed by our practice. None MME/day: 0 mg/day   Monitoring: [place] PMP: PDMP reviewed during this encounter.       Pharmacotherapy: No side-effects or adverse reactions reported. Compliance: No problems identified. Effectiveness: Clinically acceptable.  No notes on file  UDS:  Summary  Date Value Ref Range Status  05/11/2024 FINAL  Final    Comment:    ==================================================================== Compliance Drug Analysis, Ur ==================================================================== Test                             Result       Flag       Units  Drug Present and Declared for Prescription Verification   Alprazolam                      55           EXPECTED   ng/mg creat   Alpha-hydroxyalprazolam        272          EXPECTED   ng/mg creat    Source of alprazolam  is a scheduled prescription medication. Alpha-    hydroxyalprazolam is an expected metabolite of alprazolam .    Gabapentin                      PRESENT      EXPECTED   Amitriptyline                   PRESENT      EXPECTED   Nortriptyline                  PRESENT      EXPECTED    Nortriptyline is an expected metabolite of amitriptyline .    Venlafaxine                     PRESENT      EXPECTED   Desmethylvenlafaxine           PRESENT      EXPECTED    Desmethylvenlafaxine is an expected metabolite of venlafaxine .  Drug  Absent but Declared for Prescription Verification   Hydrocodone                     Not Detected UNEXPECTED ng/mg creat   Carisoprodol                    Not Detected UNEXPECTED   Acetaminophen                   Not Detected UNEXPECTED    Acetaminophen , as indicated in the declared medication list, is not  always detected even when used as directed.    Naproxen                        Not Detected UNEXPECTED   Diphenhydramine                 Not Detected UNEXPECTED ==================================================================== Test                      Result    Flag   Units      Ref Range   Creatinine              64               mg/dL      >=79 ==================================================================== Declared Medications:  The flagging and interpretation on this report are based on the  following declared medications.  Unexpected results may arise from  inaccuracies in the declared medications.   **Note: The testing scope of this panel includes these medications:   Alprazolam  (Xanax )  Amitriptyline  (Elavil )  Carisoprodol  (Soma )  Diphenhydramine  (Benadryl )  Gabapentin  (Neurontin )  Hydrocodone  (Norco)  Naproxen  (Treximet )  Venlafaxine  (Effexor )   **Note: The testing scope of this panel does not include small to  moderate amounts of these reported medications:   Acetaminophen  (Tylenol )  Acetaminophen  (Norco)   **Note: The testing scope of this panel does not include the  following reported medications:   Albuterol  (Proventil  HFA)  Azelastine  (Astelin )  Clindamycin (Cleocin)  Eletriptan  (Relpax )  Estradiol   Ezetimibe  (Zetia )  Fenofibrate  (TriCor )  Fluticasone  (Breo)  Fluticasone  (Flonase )  Folic Acid  (Folvite )  Hydroxychloroquine  (Plaquenil )  Hyoscamine (Anaspaz )  Levothyroxine  (Synthroid )  Linaclotide  (Linzess )  Magnesium  (Mag-Ox)  Montelukast  (Singulair )  Mouthwash  Multivitamin  Polyethylene Glycol  Probiotic  Sodium Chloride   Sumatriptan   (Treximet )  Topical  Triamcinolone  (Kenalog )  Valacyclovir  (Valtrex )  Vilanterol (Breo)  Vitamin B12  Vitamin D2 (Drisdol )  Vitamin E ==================================================================== For clinical consultation, please call [phone]. ====================================================================     No results found for: CBDTHCR No results found for: D8THCCBX No results found for: D9THCCBX  ROS  Constitutional: Denies any fever or chills Gastrointestinal: No reported hemesis, hematochezia, vomiting, or acute GI distress Musculoskeletal: Denies any acute onset joint swelling, redness, loss of ROM, or weakness Neurological: No reported episodes of acute onset apraxia, aphasia, dysarthria, agnosia, amnesia, paralysis, loss of coordination, or loss of consciousness  Medication Review  ALPRAZolam , Dentifrices, Estradiol , HYDROcodone -acetaminophen , Lactobacillus, Magnesium  Oxide, Multiple Vitamins-Minerals, Polyethyl Glycol-Propyl Glycol, Propylene Glycol, SUMAtriptan -naproxen , SYRINGE-NEEDLE (DISP) 3 ML, Vitamin D  (Ergocalciferol ), acetaminophen , albuterol , amitriptyline , antiseptic oral rinse, azelastine , carisoprodol , clindamycin, cyanocobalamin , diphenhydrAMINE  HCl, eletriptan , estradiol , ezetimibe , fenofibrate , fluticasone , fluticasone  furoate-vilanterol, folic acid , gabapentin , hydroxychloroquine , hyoscyamine , levothyroxine , linaclotide , montelukast , mupirocin  ointment, pantoprazole , sodium chloride , triamcinolone  cream, valACYclovir , venlafaxine  XR, and vitamin E  History Review  Allergy : Susan [name] is allergic to aspirin , keflex [cephalexin], azithromycin , barley grass, clarithromycin, metoclopramide, oat, oatmeal, other, rye grass flower pollen extract [gramineae pollens], vioxx [rofecoxib], wheat, wheat extract, celebrex [celecoxib], and metoclopramide hcl. Drug: Susan [name]  reports no history of drug use. Alcohol :  reports no history of alcohol   use. Tobacco:  reports that she has never smoked. She has been exposed to tobacco smoke. She has never used smokeless tobacco. Social: Susan [name]  reports that she has never smoked. She has been exposed to tobacco smoke. She has never used smokeless tobacco. She reports that she does not drink alcohol  and does not use drugs. 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 Surgical: Susan [name]  has a past surgical history that includes Cholecystectomy (1989); Abdominal hysterectomy (2006); Thyroid  lobectomy; Bladder repair; Nasal sinus surgery; Esophagogastroduodenoscopy (N/A, 10/07/2013); Flexible bronchoscopy (N/A, 06/04/2016); Breast cyst aspiration (Left, yrs ago); Colonoscopy with esophagogastroduodenoscopy (egd); Colonoscopy with propofol  (N/A, 08/03/2017); Total thyroidectomy; Incontinence surgery (2004); Eye surgery; Colonoscopy with propofol  (N/A, 10/18/2018); Esophagogastroduodenoscopy; Colonoscopy (N/A, 10/16/2021); and Breast biopsy (Left, 02/24/2024). Family: family history includes Arthritis/Rheumatoid in her mother; Breast cancer in her maternal aunt, maternal aunt, maternal aunt, and maternal grandmother; Breast cancer (age of onset: 21) in her mother; Lung cancer in her maternal aunt.  Laboratory Chemistry Profile   Renal Lab Results  Component Value Date   BUN  19 03/07/2024   CREATININE 0.96 03/07/2024   LABCREA 29 03/30/2023   BCR SEE NOTE: 03/07/2024   GFRAA 80 03/06/2021   GFRNONAA >60 10/23/2021    Hepatic Lab Results  Component Value Date   AST 24 03/07/2024   ALT 28 03/07/2024   ALBUMIN 4.1 06/30/2023   ALKPHOS 73 06/30/2023   AMYLASE 14 01/21/2013   LIPASE 29 10/06/2013    Electrolytes Lab Results  Component Value Date   NA 141 03/07/2024   K 4.3 03/07/2024   CL 107 03/07/2024   CALCIUM 10.0 03/07/2024   MG 2.1 05/11/2024    Bone Lab Results  Component Value Date   VD25OH 36.9 06/30/2023    Inflammation (CRP: Acute Phase) (ESR: Chronic Phase) Lab Results  Component Value Date   CRP 3.6 03/07/2024   ESRSEDRATE 14 03/07/2024         Note: Above Lab results reviewed.  Recent Imaging Review  DG PAIN CLINIC C-ARM 1-60 MIN NO REPORT Fluoro was used, but no Radiologist interpretation will be provided.  Please refer to NOTES tab for provider progress note. Note: Reviewed        Physical Exam  Vitals: There were no vitals taken for this visit. BMI: Estimated body mass index is 30.79 kg/m as calculated from the following:   Height as of 06/28/24: 5' 5 (1.651 m).   Weight as of 06/28/24: 185 lb (83.9 kg). Ideal: Patient weight not recorded General appearance: Well nourished, well developed, and well hydrated. In no apparent acute distress Mental status: Alert, oriented x 3 (person, place, & time)       Respiratory: No evidence of acute respiratory distress Eyes: PERLA   Assessment   Diagnosis Status  1. Chronic low back pain (1ry area of Pain) (Midline) w/o sciatica   2. Low back pain of over 3 months duration   3. Lumbar interspinous bursitis   4. Multifactorial low back pain   5. Mechanical low back pain   6. Postop check    Controlled Controlled Controlled   Updated Problems: No problems updated.  Plan of Care  Problem-specific:  Assessment and Plan            Susan [name] has a current  medication list which includes the following long-term medication(s): albuterol , albuterol , amitriptyline , azelastine , eletriptan , estradiol , ezetimibe , fenofibrate , fluticasone , gabapentin , hyoscyamine , levothyroxine , linzess , montelukast ,  sodium chloride , sumatriptan -naproxen , and venlafaxine  xr.  Pharmacotherapy (Medications Ordered): No orders of the defined types were placed in this encounter.  Orders:  No orders of the defined types were placed in this encounter.    Interventional Therapies  Risk Factors  Considerations  Medical Comorbidities:  Sjgren's syndrome  B12 deficiency  vitamin D  deficiency  (+) ANA  COPD  BA  GERD  IBS  OSA  GAD     Planned  Pending:      Under consideration:   Possible therapeutic midline lumbar interspinous ligament/bursa RFA #1    Completed: (Analgesic benefit)1  Diagnostic/therapeutic midline L5-S1 lumbar interspinous ligament/bursa inj. x1 (06/28/2024)    Therapeutic  Palliative (PRN) options:   None established   Completed by other providers:   None reported  1(Analgesic benefit): Expressed in percentage (%). (Local anesthetic[LA] +/- sedation  L.A.Local Anesthetic  Steroid benefit  Ongoing benefit)    No follow-ups on file.    Recent Visits Date Type Provider Dept  06/28/24 Procedure visit Tanya Glisson, MD [facility]  06/20/24 Office Visit Tanya Glisson, MD [facility]  05/11/24 Office Visit Tanya Glisson, MD [facility]  Showing recent visits within past 90 days and meeting all other requirements Future Appointments Date Type Provider Dept  07/18/24 Appointment Tanya Glisson, MD [facility]  Showing future appointments within next 90 days and meeting all other requirements  I discussed the assessment and treatment plan with the patient. The patient was provided an opportunity to ask questions and all were answered. The patient agreed with the plan and  demonstrated an understanding of the instructions.  Patient advised to call back or seek an in-person evaluation if the symptoms or condition worsens.  Duration of encounter: *** minutes.  Total time on encounter, as per AMA guidelines included both the face-to-face and non-face-to-face time personally spent by the physician and/or other qualified health care professional(s) on the day of the encounter (includes time in activities that require the physician or other qualified health care professional and does not include time in activities normally performed by clinical staff). Physician's time may include the following activities when performed: Preparing to see the patient (e.g., pre-charting review of records, searching for previously ordered imaging, lab work, and nerve conduction tests) Review of prior analgesic pharmacotherapies. Reviewing PMP Interpreting ordered tests (e.g., lab work, imaging, nerve conduction tests) Performing post-procedure evaluations, including interpretation of diagnostic procedures Obtaining and/or reviewing separately obtained history Performing a medically appropriate examination and/or evaluation Counseling and educating the patient/family/caregiver Ordering medications, tests, or procedures Referring and communicating with other health care professionals (when not separately reported) Documenting clinical information in the electronic or other health record Independently interpreting results (not separately reported) and communicating results to the patient/ family/caregiver Care coordination (not separately reported)  Note by: Glisson DELENA Tanya, MD (TTS and AI technology used. I apologize for any typographical errors that were not detected and corrected.) Date: 07/18/2024; Time: 4:11 PM

## 2024-07-18 ENCOUNTER — Ambulatory Visit (HOSPITAL_BASED_OUTPATIENT_CLINIC_OR_DEPARTMENT_OTHER): Admitting: Pain Medicine

## 2024-07-18 DIAGNOSIS — M545 Low back pain, unspecified: Secondary | ICD-10-CM

## 2024-07-18 DIAGNOSIS — Z09 Encounter for follow-up examination after completed treatment for conditions other than malignant neoplasm: Secondary | ICD-10-CM

## 2024-07-18 DIAGNOSIS — M4826 Kissing spine, lumbar region: Secondary | ICD-10-CM

## 2024-07-18 DIAGNOSIS — Z91199 Patient's noncompliance with other medical treatment and regimen due to unspecified reason: Secondary | ICD-10-CM

## 2024-07-18 DIAGNOSIS — M5459 Other low back pain: Secondary | ICD-10-CM

## 2024-07-23 ENCOUNTER — Other Ambulatory Visit: Payer: Self-pay | Admitting: Nurse Practitioner

## 2024-07-23 DIAGNOSIS — E039 Hypothyroidism, unspecified: Secondary | ICD-10-CM

## 2024-07-23 DIAGNOSIS — J301 Allergic rhinitis due to pollen: Secondary | ICD-10-CM

## 2024-07-25 ENCOUNTER — Telehealth: Payer: Self-pay | Admitting: Certified Nurse Midwife

## 2024-07-25 NOTE — Telephone Encounter (Signed)
 Patient called and would like to have the order for her Mammogram placed so that she can get this taken care of.  Please call the patient when the order is placed

## 2024-07-25 NOTE — Telephone Encounter (Signed)
 This is the result from her mammogram. Do we order the biopsy? Please advise.   RECOMMENDATION: Stereotactic core needle biopsy of the left breast calcifications with mass.   I have discussed the findings and recommendations with the patient. Patient reports she is leaving for [place]  for 6 months this weekend. We will not be able to perform the biopsy before the patient leaves therefore recommend patient bring her images and report with her to [place]  to have the calcifications biopsied there.   BI-RADS CATEGORY  4: Suspicious.

## 2024-08-02 ENCOUNTER — Ambulatory Visit: Admitting: Certified Nurse Midwife

## 2024-08-03 ENCOUNTER — Encounter: Payer: Self-pay | Admitting: Obstetrics & Gynecology

## 2024-08-05 ENCOUNTER — Other Ambulatory Visit: Payer: Self-pay | Admitting: Nurse Practitioner

## 2024-08-05 DIAGNOSIS — F411 Generalized anxiety disorder: Secondary | ICD-10-CM

## 2024-08-10 ENCOUNTER — Encounter: Payer: Self-pay | Admitting: Psychiatry

## 2024-08-10 ENCOUNTER — Telehealth (INDEPENDENT_AMBULATORY_CARE_PROVIDER_SITE_OTHER): Payer: Self-pay | Admitting: Psychiatry

## 2024-08-10 DIAGNOSIS — F3341 Major depressive disorder, recurrent, in partial remission: Secondary | ICD-10-CM | POA: Diagnosis not present

## 2024-08-10 DIAGNOSIS — F411 Generalized anxiety disorder: Secondary | ICD-10-CM

## 2024-08-10 NOTE — Patient Instructions (Signed)
 Continue venlafaxine  300 mg daily  Continue amitriptyline  25 mg at night  Next appointment: 5/6 a 2 pm

## 2024-08-10 NOTE — Progress Notes (Signed)
 Virtual Visit via Video Note  I connected with [name] on 08/10/24 at  2:00 PM EDT by a video enabled telemedicine application and verified that I am speaking with the correct person using two identifiers.  Location: Patient: home Provider: home office Persons participated in the visit- patient, provider    I discussed the limitations of evaluation and management by telemedicine and the availability of in person appointments. The patient expressed understanding and agreed to proceed.      I discussed the assessment and treatment plan with the patient. The patient was provided an opportunity to ask questions and all were answered. The patient agreed with the plan and demonstrated an understanding of the instructions.   The patient was advised to call back or seek an in-person evaluation if the symptoms worsen or if the condition fails to improve as anticipated.   Susan Sleet, MD    [facility] MD/PA/NP OP Progress Note  08/10/2024 2:37 PM Susan [name]  MRN:  [id number]  Chief Complaint:  Chief Complaint  Patient presents with   Follow-up   HPI:  This is a follow-up appointment for depression and anxiety.  She states that she has been doing good.  She is helping her great niece for kids to go to daycare.  She is still doing yard work.  Her pain has been good as long as the weather is warm.  She will be going to [place]  soon for several months.  She reports occasional bad  mood.  She feels down at times without any triggers, although it goes away after a day.  She is concerned about her memory.  She cannot remember anything.  Her husband is taking care of the finances and she manages cooking and taking care of her medication.  She agrees to discuss this with her neurology appointment for her tremor.  She sleeps 4 hours and takes a nap.  She denies much concern about her sleep.  She denies change in appetite.  She denies SI, HI, hallucinations.  She prefers to stay on the current  medication regimen at this time.   Visit Diagnosis:    ICD-10-CM   1. MDD (major depressive disorder), recurrent, in partial remission  F33.41     2. Generalized anxiety disorder  F41.1       Past Psychiatric History: Please see initial evaluation for full details. I have reviewed the history. No updates at this time.     Past Medical History:  Past Medical History:  Diagnosis Date   Arthritis    Asthma    Asthmatic bronchitis    BRCA negative 05/2014   MyRisk neg   Celiac disease    Celiac sprue    Chronic anxiety    Complication of anesthesia    vomiting   COPD (chronic obstructive pulmonary disease) (HCC)    Depression    Environmental allergies    Family history of breast cancer 05/2014   MyRisk neg; IBIS=29.5%   Fibromyalgia    neuropathy all over   Gastritis 10/08/2013   GERD (gastroesophageal reflux disease)    Hyperlipemia    Hyperlipidemia    Intolerant to statins   Hypothyroidism (acquired)    Increased risk of breast cancer 05/2014   IBIS=29.5%   Increased risk of breast cancer 05/2014   IBIS=29.5%   Migraine    Migraines    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     Past Surgical History:  Procedure Laterality Date   ABDOMINAL HYSTERECTOMY  2006   BLADDER REPAIR     BREAST BIOPSY Left 02/24/2024   BREAST CYST ASPIRATION Left yrs ago   CHOLECYSTECTOMY  1989   COLONOSCOPY N/A 10/16/2021   Procedure: COLONOSCOPY;  Surgeon: Dessa Reyes ORN, MD;  Location: [facility] ENDOSCOPY;  Service: Endoscopy;  Laterality: N/A;   COLONOSCOPY WITH ESOPHAGOGASTRODUODENOSCOPY (EGD)     COLONOSCOPY WITH PROPOFOL  N/A 08/03/2017   Procedure: COLONOSCOPY WITH PROPOFOL ;  Surgeon: Gaylyn Gladis PENNER, MD;  Location: [facility] ENDOSCOPY;  Service: Endoscopy;  Laterality: N/A;   COLONOSCOPY WITH PROPOFOL  N/A 10/18/2018   Procedure: COLONOSCOPY WITH  PROPOFOL ;  Surgeon: Gaylyn Gladis PENNER, MD;  Location: [facility] ENDOSCOPY;  Service: Endoscopy;  Laterality: N/A;   ESOPHAGOGASTRODUODENOSCOPY N/A 10/07/2013   Procedure: ESOPHAGOGASTRODUODENOSCOPY (EGD);  Surgeon: Margo LITTIE Haddock, MD;  Location: [facility] ENDO SUITE;  Service: Endoscopy;  Laterality: N/A;  patient received heparin  at 530am given phenergan  25mg  IV 30 minutes before   ESOPHAGOGASTRODUODENOSCOPY     x4   EYE SURGERY     lacrimal gland    FLEXIBLE BRONCHOSCOPY N/A 06/04/2016   Procedure: FLEXIBLE BRONCHOSCOPY;  Surgeon: Elfreda DELENA Bathe, MD;  Location: [facility] ORS;  Service: Pulmonary;  Laterality: N/A;   INCONTINENCE SURGERY  2004   NASAL SINUS SURGERY     THYROID  LOBECTOMY     TOTAL THYROIDECTOMY      Family Psychiatric History: Please see initial evaluation for full details. I have reviewed the history. No updates at this time.     Family History:  Family History  Problem Relation Age of Onset   Arthritis/Rheumatoid Mother        died in age 47s   Breast cancer Mother 89   Breast cancer Maternal Aunt        X 2. 50's   Breast cancer Maternal Aunt    Lung cancer Maternal Aunt    Breast cancer Maternal Aunt    Breast cancer Maternal Grandmother        3's   Pancreatitis Neg Hx    Colon cancer Neg Hx     Social History:  Social History   Socioeconomic History   Marital status: Married    Spouse name: Not on file   Number of children: 0   Years of education: Not on file   Highest education level: Not on file  Occupational History   Not on file  Tobacco Use   Smoking status: Never    Passive exposure: Past   Smokeless tobacco: Never  Vaping Use   Vaping status: Never Used  Substance and Sexual Activity   Alcohol  use: No   Drug use: No   Sexual activity: Not Currently    Birth control/protection: Surgical  Other Topics Concern   Not on file  Social History Narrative   Not on file   Social Drivers of Health   Financial Resource Strain: Low Risk  (03/07/2021)    Overall Financial Resource Strain (CARDIA)    Difficulty of Paying Living Expenses: Not very hard  Food Insecurity: Not on file  Transportation Needs: Not on file  Physical Activity: Not on file  Stress: Not on file  Social Connections: Not on file    Allergies:  Allergies  Allergen Reactions   Aspirin     Keflex [Cephalexin] Shortness Of Breath   Azithromycin  Hives and Nausea Only   Barley  Grass    Clarithromycin Other (See Comments)   Metoclopramide Other (See Comments)    TREMORS Tremors Tremors    Oat Other (See Comments)   Oatmeal Other (See Comments)   Other     Other reaction(s): Other (See Comments) KEOPLEX.   Rye Grass Flower Pollen Extract [Gramineae Pollens]    Vioxx [Rofecoxib] Diarrhea   Wheat Other (See Comments)    Celiac disease   Wheat Extract    Celebrex [Celecoxib] Hives   Metoclopramide Hcl Anxiety    BODY TREMORS    Metabolic Disorder Labs: No results found for: HGBA1C, MPG No results found for: PROLACTIN Lab Results  Component Value Date   CHOL 152 06/30/2023   TRIG 104 06/30/2023   HDL 53 06/30/2023   CHOLHDL 2.9 06/30/2023   VLDL 22 03/23/2020   LDLCALC 80 06/30/2023   LDLCALC 114 (H) 04/16/2023   Lab Results  Component Value Date   TSH 0.802 06/30/2023   TSH 6.380 (H) 04/16/2023    Therapeutic Level Labs: No results found for: LITHIUM No results found for: VALPROATE No results found for: CBMZ  Current Medications: Current Outpatient Medications  Medication Sig Dispense Refill   acetaminophen  (TYLENOL ) 500 MG tablet Take by mouth as needed.     albuterol  (PROVENTIL ) (2.5 MG/3ML) 0.083% nebulizer solution Take 3 mLs (2.5 mg total) by nebulization every 6 (six) hours as needed for wheezing or shortness of breath. 75 mL 6   albuterol  (VENTOLIN  HFA) 108 (90 Base) MCG/ACT inhaler Inhale 2 puffs into the lungs every 6 (six) hours as needed for wheezing or shortness of breath. 1 each 2   ALPRAZolam  (XANAX ) 0.5 MG tablet  TAKE 1 TABLET BY MOUTH THREE TIMES DAILY AS NEEDED FOR ANXIETY 90 tablet 0   amitriptyline  (ELAVIL ) 25 MG tablet Take 1 tablet (25 mg total) by mouth at bedtime. 90 tablet 1   antiseptic oral rinse (BIOTENE) LIQD 15 mLs by Mouth Rinse route as needed for dry mouth.     Azelastine  HCl 137 MCG/SPRAY SOLN USE 2 SPRAY(S) IN EACH NOSTRIL TWICE DAILY AS DIRECTED 30 mL 1   carisoprodol  (SOMA ) 350 MG tablet Take 0.5 tablets (175 mg total) by mouth at bedtime. 45 tablet 2   clindamycin (CLEOCIN T) 1 % lotion Apply 1 Application topically at bedtime.     cyanocobalamin  (VITAMIN B12) 1000 MCG/ML injection Inject 1 mL (1,000 mcg total) into the muscle every 30 (thirty) days. 3 mL 1   Dentifrices (BIOTENE DRY MOUTH DT) by Transmucosal route daily.     diphenhydrAMINE  HCl (BENADRYL  PO) Take 12.5 mg by mouth.     eletriptan  (RELPAX ) 40 MG tablet Take 1 tablet (40 mg total) by mouth daily as needed for migraine. may repeat in 2 hours if necessary 10 tablet 5   estradiol  (CLIMARA  - DOSED IN MG/24 HR) 0.05 mg/24hr patch APPLY 1 PATCH TOPICALLY TO SKIN ONCE A WEEK 12 patch 3   Estradiol  (YUVAFEM ) 10 MCG TABS vaginal tablet INSERT 1 TABLET VAGINALLY TWICE A WEEK 24 tablet 3   ezetimibe  (ZETIA ) 10 MG tablet Take 1 tablet (10 mg total) by mouth daily. 90 tablet 3   fenofibrate  (TRICOR ) 145 MG tablet Take 1 tablet (145 mg total) by mouth daily. 90 tablet 1   fluticasone  (FLONASE ) 50 MCG/ACT nasal spray Place 2 sprays into both nostrils daily. 16 g 3   fluticasone  furoate-vilanterol (BREO ELLIPTA ) 100-25 MCG/ACT AEPB Inhale 1 puff into the lungs daily. 180 each 3   folic acid  (  FOLVITE ) 1 MG tablet Take 1 tablet (1 mg total) by mouth daily. 90 tablet 1   gabapentin  (NEURONTIN ) 600 MG tablet Take 1/2 (one-half) tablet by mouth daily 45 tablet 3   HYDROcodone -acetaminophen  (NORCO) 7.5-325 MG tablet One tab po tid for pain prn only 30 tablet 0   hydroxychloroquine  (PLAQUENIL ) 200 MG tablet Take 1 tablet by mouth twice daily  180 tablet 0   hyoscyamine  (ANASPAZ ) 0.125 MG TBDP disintergrating tablet Place 0.125 mg under the tongue every 4 (four) hours as needed for bladder spasms.      Lactobacillus (DIGESTIVE HEALTH PROBIOTIC PO) Take 1 tablet by mouth daily.      levothyroxine  (SYNTHROID ) 137 MCG tablet TAKE 1 TABLET BY MOUTH ONCE DAILY BEFORE BREAKFAST. STOP 125 MCG DOSE 90 tablet 1   LINZESS  290 MCG CAPS capsule Take 1 capsule (290 mcg total) by mouth daily. 90 capsule 3   Magnesium  Oxide (NATRUL MAGNESIUM  PO) Take by mouth.      montelukast  (SINGULAIR ) 10 MG tablet TAKE 1 TABLET BY MOUTH ONCE DAILY FOR ASTHMA 90 tablet 3   Multiple Vitamins-Minerals (ZINC PO) Take by mouth daily.     mupirocin  ointment (BACTROBAN ) 2 % Apply topically as needed (dermatitis). 22 g 0   pantoprazole  (PROTONIX ) 40 MG tablet Take 40 mg by mouth daily.      Polyethyl Glycol-Propyl Glycol 0.4-0.3 % SOLN Apply to eye.      Propylene Glycol (SYSTANE COMPLETE OP) Apply 1 drop to eye as needed (dry eyes).     sodium chloride  (OCEAN) 0.65 % SOLN nasal spray Place 1 spray into both nostrils as needed for congestion.      SUMAtriptan -naproxen  (TREXIMET ) 85-500 MG tablet Take 1 tablet by mouth daily as needed for migraine. 10 tablet 2   SYRINGE-NEEDLE, DISP, 3 ML (B-D 3CC LUER-LOK SYR 25GX5/8) 25G X 5/8 3 ML MISC USE 1 SYRINGE ONCE EVERY MONTH AS DIRECTED 1 each 0   triamcinolone  cream (KENALOG ) 0.5 % Apply 1 Application topically 3 (three) times daily.     valACYclovir  (VALTREX ) 1000 MG tablet 2 grams by mouth every 12 hours for 2 days at the onset of a cold sore 32 tablet 5   venlafaxine  XR (EFFEXOR -XR) 150 MG 24 hr capsule Take 2 capsules (300 mg total) by mouth at bedtime. 180 capsule 1   Vitamin D , Ergocalciferol , (DRISDOL ) 1.25 MG (50000 UNIT) CAPS capsule Take 1 capsule by mouth once a week 12 capsule 0   vitamin E 1000 UNIT capsule Take by mouth.      No current facility-administered medications for this visit.      Musculoskeletal: Strength & Muscle Tone: N/A Gait & Station: N/A Patient leans: N/A  Psychiatric Specialty Exam: Review of Systems  Psychiatric/Behavioral:  Positive for dysphoric mood and sleep disturbance. Negative for agitation, behavioral problems, confusion, decreased concentration, hallucinations, self-injury and suicidal ideas. The patient is not nervous/anxious and is not hyperactive.   All other systems reviewed and are negative.   There were no vitals taken for this visit.There is no height or weight on file to calculate BMI.  General Appearance: Well Groomed  Eye Contact:  Good  Speech:  Clear and Coherent  Volume:  Normal  Mood:  good  Affect:  Appropriate, Congruent, and calm  Thought Process:  Coherent and Goal Directed  Orientation:  Full (Time, Place, and Person)  Thought Content: Logical   Suicidal Thoughts:  No  Homicidal Thoughts:  No  Memory:  Immediate;   Good  Judgement:  Good  Insight:  Good  Psychomotor Activity:  Normal  Concentration:  Concentration: Good and Attention Span: Good  Recall:  Good  Fund of Knowledge: Good  Language: Good  Akathisia:  No  Handed:  Right  AIMS (if indicated): not done  Assets:  Communication Skills Desire for Improvement  ADL's:  Intact  Cognition: WNL  Sleep:  Good   Screenings: Mini-Mental    Flowsheet Row Office Visit from 04/13/2024 in [facility], [facility] Office Visit from 04/08/2023 in [facility], [facility] Office Visit from 04[facility]02/2022 in [facility], [facility] Clinical Support from 03/26/2021 in [facility], [facility] Clinical Support from 03/22/2020 in [facility], [facility]  Total Score (max 30 points ) 30 29 27 30 30    PHQ2-9    Flowsheet Row Office Visit from 05/11/2024 in [facility] Visit from 04/13/2024 in [facility], [facility] Office Visit from 04/08/2023 in [facility], [facility]  Office Visit from 04/02/2022 in [facility], [facility] Office Visit from 03/03/2022 in [facility]  PHQ-2 Total Score 0 0 0 0 1   Flowsheet Row Admission (Discharged) from 10/16/2021 in [facility]  C-SSRS RISK CATEGORY No Risk     Assessment and Plan:  Susan [name] is a 68 y.o. year old female with a history of depression, anxiety, Sjogren's syndrome on hydroxychloroquine , bilateral osteoarthritis, COPD, s/p thyroidectomy, hypothyroidism, OSA (on CPAP),  history of Celiac Disease, GERD,  , who presents for follow up appointment for below.   1. MDD (major depressive disorder), recurrent, in partial remission 2. Generalized anxiety disorder Other stressors include: loss of her close friends, sister in law, parents    History:  Tx from Dr. Linnie. Anxiety worsened after she had a dental procedure when she was a child. Originally on venlafaxine  300 mg daily, amitriptyline  25 mg at night, xanax  0.5 mg BID   Although she reports occasional down mood without any triggers and anxiety, it has been overall manageable since the last visit.  She engaged in variety of activities including attending to her church regularly, helping her family member's children to go to daycare.  Will continue current dose of venlafaxine  to target depression and anxiety, along with amitriptyline  given she reports significant benefit for mood and fibromyalgia.  While it is preferable to try monotherapy, she could not tolerate discontinuation despite that she reportedly tried a few times in the past.  Although it has been discussed several times to limit the use of Xanax , this has been prescribed by another provider.    2. Cognitive decline Unchanged. Although OT referral was sent for evaluation given her reported memory loss and occasional issues with executive functioning, she declined this referral when she was contacted. Noted that although she  reports good benefit from Ritalin in the past, it was reportedly discontinued due to adverse reaction.  Discussed risk of gabapentin  and Xanax  on cognition.  She has an upcoming appointment with neurologist for tremor; she agrees to discuss this concern.    # tremors Exam is notable for head tremors, which she believes has been worsening lately.  She has an upcoming appointment with neurologist.    Plan Continue venlafaxine  300 mg daily  Continue amitriptyline  25 mg at night (anticholinergic side effect from higher dose) HR 74, qtc 446 msec 09/2022 Next appointment: 5/6 a 2 pm, video - on carisoprodol  (Soma ), gabapentin  300 mg BID -  on xanax  0.5 mg  TID, prescribed by Mardy Fuller Abernathy  - TSH, vitamin B12/D, folate checked 06/2023- wnl except slightly low vitamin D     Past trials of medication: sertraline, Paxil, venlafaxine , Xanax , Ritalin (worked very well)    The patient demonstrates the following risk factors for suicide: Chronic risk factors for suicide include: psychiatric disorder of anxiety. Acute risk factors for suicide include: N/A. Protective factors for this patient include: positive social support, responsibility to others (children, family), coping skills and hope for the future. Considering these factors, the overall suicide risk at this point appears to be low. She owns a gun. Patient is appropriate for outpatient follow up.  Collaboration of Care: Collaboration of Care: Other reviewed notes in Epic  Patient/Guardian was advised Release of Information must be obtained prior to any record release in order to collaborate their care with an outside provider. Patient/Guardian was advised if they have not already done so to contact the registration department to sign all necessary forms in order for us  to release information regarding their care.   Consent: Patient/Guardian gives verbal consent for treatment and assignment of benefits for services provided during this visit.  Patient/Guardian expressed understanding and agreed to proceed.    Susan Sleet, MD 08/10/2024, 2:37 PM

## 2024-08-12 ENCOUNTER — Other Ambulatory Visit: Payer: Self-pay

## 2024-08-12 DIAGNOSIS — Z1231 Encounter for screening mammogram for malignant neoplasm of breast: Secondary | ICD-10-CM

## 2024-08-15 ENCOUNTER — Other Ambulatory Visit: Payer: Self-pay | Admitting: Psychiatry

## 2024-08-15 ENCOUNTER — Ambulatory Visit: Admitting: Pain Medicine

## 2024-08-15 ENCOUNTER — Other Ambulatory Visit: Payer: Self-pay | Admitting: Nurse Practitioner

## 2024-08-15 DIAGNOSIS — M797 Fibromyalgia: Secondary | ICD-10-CM

## 2024-08-15 DIAGNOSIS — F411 Generalized anxiety disorder: Secondary | ICD-10-CM

## 2024-08-15 DIAGNOSIS — L568 Other specified acute skin changes due to ultraviolet radiation: Secondary | ICD-10-CM | POA: Diagnosis not present

## 2024-08-15 DIAGNOSIS — L710 Perioral dermatitis: Secondary | ICD-10-CM | POA: Diagnosis not present

## 2024-08-23 ENCOUNTER — Encounter: Payer: Self-pay | Admitting: Nurse Practitioner

## 2024-08-23 ENCOUNTER — Ambulatory Visit: Attending: Nurse Practitioner | Admitting: Nurse Practitioner

## 2024-08-23 VITALS — BP 134/88 | HR 72 | Temp 97.3°F | Resp 16 | Ht 65.0 in | Wt 185.0 lb

## 2024-08-23 DIAGNOSIS — M545 Low back pain, unspecified: Secondary | ICD-10-CM | POA: Insufficient documentation

## 2024-08-23 DIAGNOSIS — G8929 Other chronic pain: Secondary | ICD-10-CM | POA: Diagnosis not present

## 2024-08-23 DIAGNOSIS — R200 Anesthesia of skin: Secondary | ICD-10-CM | POA: Diagnosis not present

## 2024-08-23 DIAGNOSIS — M5459 Other low back pain: Secondary | ICD-10-CM | POA: Diagnosis not present

## 2024-08-23 DIAGNOSIS — G250 Essential tremor: Secondary | ICD-10-CM | POA: Diagnosis not present

## 2024-08-23 DIAGNOSIS — M4826 Kissing spine, lumbar region: Secondary | ICD-10-CM | POA: Insufficient documentation

## 2024-08-23 DIAGNOSIS — Z1331 Encounter for screening for depression: Secondary | ICD-10-CM | POA: Diagnosis not present

## 2024-08-23 DIAGNOSIS — R202 Paresthesia of skin: Secondary | ICD-10-CM | POA: Diagnosis not present

## 2024-08-23 NOTE — Progress Notes (Signed)
 Safety precautions to be maintained throughout the outpatient stay will include: orient to surroundings, keep bed in low position, maintain call bell within reach at all times, provide assistance with transfer out of bed and ambulation.

## 2024-08-23 NOTE — Progress Notes (Signed)
 PROVIDER NOTE: Interpretation of information contained herein should be left to medically-trained personnel. Specific patient instructions are provided elsewhere under Patient Instructions section of medical record. This document was created in part using AI and STT-dictation technology, any transcriptional errors that may result from this process are unintentional.  Patient: Susan [name]  Service: E/M   PCP: Susan Fish, NP  DOB: November 23, 1955  DOS: 08/23/2024  Provider: Emmy MARLA Blanch, NP  MRN: [id number]  Delivery: Face-to-face  Specialty: Interventional Pain Management  Type: Established Patient  Setting: Ambulatory outpatient facility  Specialty designation: 09  Referring Prov.: Susan Fish, NP  Location: Outpatient office facility       History of present illness (HPI) Susan [name], a 68 y.o. year old female, is here today because of her low back pain. 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   Pain Assessment: Severity of Chronic pain is reported as a 0-No pain/10. Location: Back Lower/denies. Onset: More than a month ago. Quality: Other (Comment) (No pain at this time). Timing: Intermittent. Modifying factor(s): Procedure. Vitals:  height is 5' 5 (1.651 m) and weight is 185 lb (83.9 kg). Her temperature is 97.3 F (36.3 C) (abnormal). Her blood pressure is 134/88 and her pulse is 72. Her respiration is  16 and oxygen saturation is 99%.  BMI: Estimated body mass index is 30.79 kg/m as calculated from the following:   Height as of this encounter: 5' 5 (1.651 m).   Weight as of this encounter: 185 lb (83.9 kg).  Last encounter: 06/20/2024 Last procedure: 06/28/2024  Reason for encounter: post-procedure evaluation and assessment.  Susan [name] received a diagnostic/therapeutic ligament injection on June 28, 2024.  She reports initially 80 to 100% pain relief during local anesthetic phase, followed sustained 80% ongoing pain relief and functional improvement since the procedure.   Discussed the use of AI scribe software for clinical note transcription with the patient, who gave verbal consent to proceed.  History of Present Illness   Susan [name] is a 68 year old female who presents for follow-up of lower back pain after a procedure.  She reports significant improvement in her lower back pain following a procedure performed on June 28, 2024. She feels 'eighty percent better' and experiences minimal pain now, although standing still can cause discomfort, however, she reports significant functional improvement after procedure.   She is not currently on any regular pain medication but takes narcotics as needed, approximately fifteen pills over six months, which she finds effective in managing her pain, which she received from her PCP.   She plans to be in [place]  for five to six months.     Procedure Type: Ligament/Tendon insertion (79448) Injection. #1  Laterality: Midline  Level: L5-S1 Target: Interspinous ligament Location: Posterior spinal elements Approach: Percutaneous    Imaging: Fluoroscopic guidance Anesthesia: Local anesthesia (1-2% Lidocaine ) Anxiolysis: None                 Sedation: No Sedation  DOS: 06/28/2024  Performed by: Eric DELENA Como, MD   Rationale (medical necessity): procedure needed and proper for the diagnosis and/or  treatment of the patient's medical symptoms and needs. Purpose: Diagnostic/Therapeutic Indications: Posterior spinal enthesopathy (disorder of ligament or connective tissues. i.e.: tendons and/or ligaments) severe enough to impact quality of life or function. 1. Chronic low back pain (1ry area of Pain) (Midline) w/o sciatica   2. Low back pain of over 3 months duration   3. Lumbar interspinous bursitis   4. Multifactorial low back pain   5. Mechanical low back pain   6. Spinal column pain     NAS-11 Pain score:        Pre-procedure: 5 /10        Post-procedure: 3 /10   Post-Procedure Evaluation    Effectiveness:  Initial hour after procedure: 80 % . Subsequent 4-6 hours post-procedure: 100 % . Analgesia past initial 6 hours: 80 % (today) . Ongoing improvement:  Analgesic:  Susan [name] received a diagnostic/therapeutic ligament injection on June 28, 2024.  She reports initially 80 to 100% pain relief during local anesthetic phase, followed sustained 80% ongoing pain relief and functional improvement since the procedure.  Function: Susan [name] reports improvement in function ROM: Susan [name] reports improvement in ROM Pharmacotherapy Assessment   Monitoring: [place] PMP: PDMP reviewed during this encounter.       Pharmacotherapy: No side-effects or adverse reactions reported. Compliance: No problems identified. Effectiveness: Clinically acceptable.  Susan Montie FALCON, RN  08/23/2024  9:16 AM  Sign when Signing Visit Safety precautions to be maintained throughout the outpatient stay will include: orient to surroundings, keep bed in low position, maintain call bell within reach at all times, provide assistance with transfer out of bed and ambulation.     UDS:  Summary  Date Value Ref Range Status  05/11/2024 FINAL  Final    Comment:    ==================================================================== Compliance Drug Analysis,  Ur ==================================================================== Test                             Result       Flag       Units  Drug Present and Declared for Prescription Verification   Alprazolam                      55           EXPECTED   ng/mg creat   Alpha-hydroxyalprazolam        272          EXPECTED   ng/mg creat    Source of alprazolam  is a scheduled prescription medication. Alpha-    hydroxyalprazolam is an expected metabolite of alprazolam .    Gabapentin                      PRESENT      EXPECTED   Amitriptyline                   PRESENT      EXPECTED   Nortriptyline                  PRESENT      EXPECTED    Nortriptyline is an expected metabolite of amitriptyline .    Venlafaxine                     PRESENT      EXPECTED  Desmethylvenlafaxine           PRESENT      EXPECTED    Desmethylvenlafaxine is an expected metabolite of venlafaxine .  Drug Absent but Declared for Prescription Verification   Hydrocodone                     Not Detected UNEXPECTED ng/mg creat   Carisoprodol                    Not Detected UNEXPECTED   Acetaminophen                   Not Detected UNEXPECTED    Acetaminophen , as indicated in the declared medication list, is not    always detected even when used as directed.    Naproxen                        Not Detected UNEXPECTED   Diphenhydramine                 Not Detected UNEXPECTED ==================================================================== Test                      Result    Flag   Units      Ref Range   Creatinine              64               mg/dL      >=79 ==================================================================== Declared Medications:  The flagging and interpretation on this report are based on the  following declared medications.  Unexpected results may arise from  inaccuracies in the declared medications.   **Note: The testing scope of this panel includes these medications:   Alprazolam  (Xanax )  Amitriptyline   (Elavil )  Carisoprodol  (Soma )  Diphenhydramine  (Benadryl )  Gabapentin  (Neurontin )  Hydrocodone  (Norco)  Naproxen  (Treximet )  Venlafaxine  (Effexor )   **Note: The testing scope of this panel does not include small to  moderate amounts of these reported medications:   Acetaminophen  (Tylenol )  Acetaminophen  (Norco)   **Note: The testing scope of this panel does not include the  following reported medications:   Albuterol  (Proventil  HFA)  Azelastine  (Astelin )  Clindamycin (Cleocin)  Eletriptan  (Relpax )  Estradiol   Ezetimibe  (Zetia )  Fenofibrate  (TriCor )  Fluticasone  (Breo)  Fluticasone  (Flonase )  Folic Acid  (Folvite )  Hydroxychloroquine  (Plaquenil )  Hyoscamine (Anaspaz )  Levothyroxine  (Synthroid )  Linaclotide  (Linzess )  Magnesium  (Mag-Ox)  Montelukast  (Singulair )  Mouthwash  Multivitamin  Polyethylene Glycol  Probiotic  Sodium Chloride   Sumatriptan  (Treximet )  Topical  Triamcinolone  (Kenalog )  Valacyclovir  (Valtrex )  Vilanterol (Breo)  Vitamin B12  Vitamin D2 (Drisdol )  Vitamin E ==================================================================== For clinical consultation, please call [phone]. ====================================================================     No results found for: CBDTHCR No results found for: D8THCCBX No results found for: D9THCCBX  ROS  Constitutional: Denies any fever or chills Gastrointestinal: No reported hemesis, hematochezia, vomiting, or acute GI distress Musculoskeletal: Low back pain Neurological: No reported episodes of acute onset apraxia, aphasia, dysarthria, agnosia, amnesia, paralysis, loss of coordination, or loss of consciousness  Medication Review  ALPRAZolam , Azelastine  HCl, Dentifrices, Estradiol , HYDROcodone -acetaminophen , Lactobacillus, Magnesium  Oxide, Multiple Vitamins-Minerals, Polyethyl Glycol-Propyl Glycol, Propylene Glycol, SUMAtriptan -naproxen , SYRINGE-NEEDLE (DISP) 3 ML, Vitamin D   (Ergocalciferol ), acetaminophen , albuterol , amitriptyline , antiseptic oral rinse, carisoprodol , clindamycin, cyanocobalamin , diphenhydrAMINE  HCl, eletriptan , estradiol , ezetimibe , fenofibrate , fluticasone , fluticasone  furoate-vilanterol, folic acid , gabapentin , hydroxychloroquine , hyoscyamine , levothyroxine , linaclotide , montelukast , mupirocin  ointment, pantoprazole , sodium chloride , triamcinolone  cream, valACYclovir , venlafaxine  XR, and vitamin E  History  Review  Allergy : Susan [name] is allergic to aspirin , keflex [cephalexin], azithromycin , barley grass, clarithromycin, metoclopramide, oat, oatmeal, other, rye grass flower pollen extract [gramineae pollens], vioxx [rofecoxib], wheat, wheat extract, celebrex [celecoxib], and metoclopramide hcl. Drug: Susan [name]  reports no history of drug use. Alcohol :  reports no history of alcohol  use. Tobacco:  reports that she has never smoked. She has been exposed to tobacco smoke. She has never used smokeless tobacco. Social: Susan [name]  reports that she has never smoked. She has been exposed to tobacco smoke. She has never used smokeless tobacco. She reports that she does not drink alcohol  and does not use drugs. 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 Surgical: Susan [name]  has a past surgical history that includes Cholecystectomy  (1989); Abdominal hysterectomy (2006); Thyroid  lobectomy; Bladder repair; Nasal sinus surgery; Esophagogastroduodenoscopy (N/A, 10/07/2013); Flexible bronchoscopy (N/A, 06/04/2016); Breast cyst aspiration (Left, yrs ago); Colonoscopy with esophagogastroduodenoscopy (egd); Colonoscopy with propofol  (N/A, 08/03/2017); Total thyroidectomy; Incontinence surgery (2004); Eye surgery; Colonoscopy with propofol  (N/A, 10/18/2018); Esophagogastroduodenoscopy; Colonoscopy (N/A, 10/16/2021); and Breast biopsy (Left, 02/24/2024). Family: family history includes Arthritis/Rheumatoid in her mother; Breast cancer in her maternal aunt, maternal aunt, maternal aunt, and maternal grandmother; Breast cancer (age of onset: 82) in her mother; Lung cancer in her maternal aunt.  Laboratory Chemistry Profile   Renal Lab Results  Component Value Date   BUN 19 03/07/2024   CREATININE 0.96 03/07/2024   LABCREA 29 03/30/2023   BCR SEE NOTE: 03/07/2024   GFRAA 80 03/06/2021   GFRNONAA >60 10/23/2021    Hepatic Lab Results  Component Value Date   AST 24 03/07/2024   ALT 28 03/07/2024   ALBUMIN 4.1 06/30/2023   ALKPHOS 73 06/30/2023   AMYLASE 14 01/21/2013   LIPASE 29 10/06/2013    Electrolytes Lab Results  Component Value Date   NA 141 03/07/2024   K 4.3 03/07/2024   CL 107 03/07/2024   CALCIUM 10.0 03/07/2024   MG 2.1 05/11/2024    Bone Lab Results  Component Value Date   VD25OH 36.9 06/30/2023    Inflammation (CRP: Acute Phase) (ESR: Chronic Phase) Lab Results  Component Value Date   CRP 3.6 03/07/2024   ESRSEDRATE 14 03/07/2024         Note: Above Lab results reviewed.  Recent Imaging Review  DG PAIN CLINIC C-ARM 1-60 MIN NO REPORT Fluoro was used, but no Radiologist interpretation will be provided.  Please refer to NOTES tab for provider progress note. Note: Reviewed        Physical Exam  Vitals: BP 134/88   Pulse 72   Temp (!) 97.3 F (36.3 C)   Resp 16   Ht 5' 5 (1.651 m)   Wt  185 lb (83.9 kg)   SpO2 99%   BMI 30.79 kg/m  BMI: Estimated body mass index is 30.79 kg/m as calculated from the following:   Height as of this encounter: 5' 5 (1.651 m).   Weight as of this encounter: 185 lb (83.9 kg). Ideal: Ideal body weight: 57 kg (125 lb 10.6 oz)  Adjusted ideal body weight: 67.8 kg (149 lb 6.4 oz) General appearance: Well nourished, well developed, and well hydrated. In no apparent acute distress Mental status: Alert, oriented x 3 (person, place, & time)       Respiratory: No evidence of acute respiratory distress Eyes: PERLA  Musculoskeletal: +LBP Assessment   Diagnosis Status  1. Chronic low back pain (1ry area of Pain) (Midline) w/o sciatica   2. Low back pain of over 3 months duration   3. Lumbar interspinous bursitis   4. Chronic low back pain (Bilateral) w/o sciatica   5. Lumbar facet joint pain (Bilateral)    Improved Improved Improved   Updated Problems: No problems updated.  Plan of Care  Problem-specific:  Assessment and Plan    Chronic low back pain Significant improvement post-procedure, 80% better. Minimal residual pain with standing. Effective management with as-needed narcotics with her PCP.  - Schedule repeat procedure with Dr. Naveira if pain becomes intolerable. - Continue as-needed narcotic medication from her primary care provider. Lumbar interspinous bursitis:  She reports significant improvement in her lower back pain following a procedure performed on June 28, 2024. She feels 'eighty percent better' and experiences minimal pain now, although standing still can cause discomfort, however, she reports significant functional improvement after procedure.       Susan [name] has a current medication list which includes the following long-term medication(s): albuterol , albuterol , [START ON 11/14/2024] amitriptyline , azelastine  hcl, eletriptan , estradiol , ezetimibe , fenofibrate , fluticasone , gabapentin , hyoscyamine ,  levothyroxine , linzess , montelukast , sodium chloride , sumatriptan -naproxen , and venlafaxine  xr.  Pharmacotherapy (Medications Ordered): No orders of the defined types were placed in this encounter.  Orders:  No orders of the defined types were placed in this encounter.       Return for (PRN), Susan Blanch NP.    Recent Visits Date Type Provider Dept  06/28/24 Procedure visit Tanya Glisson, MD [facility]  06/20/24 Office Visit Tanya Glisson, MD [facility]  Showing recent visits within past 90 days and meeting all other requirements Today's Visits Date Type Provider Dept  08/23/24 Office Visit Paislee Szatkowski K, NP [facility]  Showing today's visits and meeting all other requirements Future Appointments No visits were found meeting these conditions. Showing future appointments within next 90 days and meeting all other requirements  I discussed the assessment and treatment plan with the patient. The patient was provided an opportunity to ask questions and all were answered. The patient agreed with the plan and demonstrated an understanding of the instructions.  Patient advised to call back or seek an in-person evaluation if the symptoms or condition worsens.  I personally spent a total of 20 minutes in the care of the patient today including preparing to see the patient, getting/reviewing separately obtained history, performing a medically appropriate exam/evaluation, counseling and educating, placing orders, referring and communicating with other health care professionals, documenting clinical information in the EHR, independently interpreting results, communicating results, and coordinating care.   Note by: Brandelyn Henne K Jaime Dome, NP (TTS and AI technology used. I apologize for any typographical errors that were not detected and corrected.) Date: 08/23/2024; Time: 9:42 AM

## 2024-08-29 NOTE — Progress Notes (Unsigned)
 ANNUAL EXAM Patient name: Susan [name] MRN [id number]  Date of birth: 12/05/1955 Chief Complaint:   No chief complaint on file.  History of Present Illness:   Susan [name] is a 68 y.o. G0P0000 Caucasian female being seen today for a routine annual exam.  Current complaints: ***  No LMP recorded. Patient has had a hysterectomy.       The pregnancy intention screening data noted above was reviewed. Potential methods of contraception were discussed. The patient elected to proceed with No data recorded.      Component Value Date/Time   DIAGPAP (A) 09/21/2023 1054    - Atypical squamous cells of undetermined significance (ASC-US )   DIAGPAP (A) 10/22/2022 1056    - Atypical squamous cells of undetermined significance (ASC-US )   DIAGPAP (A) 10/18/2021 1358    - Atypical squamous cells of undetermined significance (ASC-US )   HPVHIGH Negative 09/21/2023 1054   HPVHIGH Negative 10/22/2022 1056   ADEQPAP Satisfactory for evaluation. 09/21/2023 1054   ADEQPAP Satisfactory for evaluation. 10/22/2022 1056   ADEQPAP Satisfactory for evaluation. 10/18/2021 1358      Last pap 09/21/23. Results were: ASCUS w/ HRHPV negative. H/O abnormal pap: yes Last mammogram: 11/05/23. Results were: abnormal Bi Rad 4 suspicious. Family h/o breast cancer: {yes***/no:23838} Last colonoscopy: 10/18/18. Results were: normal. Family h/o colorectal cancer: {yes***/no:23838}     05/11/2024   12:54 PM 04/13/2024    2:33 PM 04/08/2023    2:02 PM 04/02/2022    3:10 PM 03/03/2022    9:09 AM  Depression screen PHQ 2/9  Decreased Interest 0 0 0 0   Down, Depressed, Hopeless 0 0 0 0   PHQ - 2 Score 0 0 0 0      Information is confidential and restricted. Go to Review Flowsheets to unlock data.         No data to display            Past Medical History:  Diagnosis Date   Arthritis    Asthma    Asthmatic bronchitis    BRCA negative 05/2014   MyRisk neg   Celiac disease    Celiac sprue     Chronic anxiety    Complication of anesthesia    vomiting   COPD (chronic obstructive pulmonary disease) (HCC)    Depression    Environmental allergies    Family history of breast cancer 05/2014   MyRisk neg; IBIS=29.5%   Fibromyalgia    neuropathy all over   Gastritis 10/08/2013   GERD (gastroesophageal reflux disease)    Hyperlipemia    Hyperlipidemia    Intolerant to statins   Hypothyroidism (acquired)    Increased risk of breast cancer 05/2014   IBIS=29.5%   Increased risk of breast cancer 05/2014   IBIS=29.5%   Migraine    Migraines    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     Family History  Problem Relation Age of Onset   Arthritis/Rheumatoid Mother        died in age 38s   Breast cancer Mother 51   Breast cancer Maternal Aunt        X 2. 50's   Breast cancer Maternal Aunt    Lung cancer Maternal Aunt    Breast cancer Maternal Aunt    Breast cancer Maternal Grandmother  40's   Pancreatitis Neg Hx    Colon cancer Neg Hx    Review of Systems:   Pertinent items are noted in HPI Denies any headaches, blurred vision, fatigue, shortness of breath, chest pain, abdominal pain, abnormal vaginal discharge/itching/odor/irritation, problems with periods, bowel movements, urination, or intercourse unless otherwise stated above. Pertinent History Reviewed:  Reviewed past medical,surgical, social and family history.  Reviewed problem list, medications and allergies. Physical Assessment:  There were no vitals filed for this visit.There is no height or weight on file to calculate BMI.       Physical Exam   No results found for this or any previous visit (from the past 24 hours).  Assessment & Plan:  There are no diagnoses linked to this encounter.  Mammogram: {Mammo f/u:25212::@ 68yo}, or sooner if problems Colonoscopy: {TCS  f/u:25213::@ 68yo}, or sooner if problems  No orders of the defined types were placed in this encounter.   Meds: No orders of the defined types were placed in this encounter.   Follow-up: No follow-ups on file.  Rollo JINNY Maxin, CMA 08/29/2024 2:01 PM

## 2024-08-29 NOTE — Patient Instructions (Incomplete)
 Preventive Care 16 Years and Older, Female Preventive care refers to lifestyle choices and visits with your health care provider that can promote health and wellness. Preventive care visits are also called wellness exams. What can I expect for my preventive care visit? Counseling Your health care provider may ask you questions about your: Medical history, including: Past medical problems. Family medical history. Pregnancy and menstrual history. History of falls. Current health, including: Memory and ability to understand (cognition). Emotional well-being. Home life and relationship well-being. Sexual activity and sexual health. Lifestyle, including: Alcohol , nicotine or tobacco, and drug use. Access to firearms. Diet, exercise, and sleep habits. Work and work astronomer. Sunscreen use. Safety issues such as seatbelt and bike helmet use. Physical exam Your health care provider will check your: Height and weight. These may be used to calculate your BMI (body mass index). BMI is a measurement that tells if you are at a healthy weight. Waist circumference. This measures the distance around your waistline. This measurement also tells if you are at a healthy weight and may help predict your risk of certain diseases, such as type 2 diabetes and high blood pressure. Heart rate and blood pressure. Body temperature. Skin for abnormal spots. What immunizations do I need?  Vaccines are usually given at various ages, according to a schedule. Your health care provider will recommend vaccines for you based on your age, medical history, and lifestyle or other factors, such as travel or where you work. What tests do I need? Screening Your health care provider may recommend screening tests for certain conditions. This may include: Lipid and cholesterol levels. Hepatitis C test. Hepatitis B test. HIV (human immunodeficiency virus) test. STI (sexually transmitted infection) testing, if you are at  risk. Lung cancer screening. Colorectal cancer screening. Diabetes screening. This is done by checking your blood sugar (glucose) after you have not eaten for a while (fasting). Mammogram. Talk with your health care provider about how often you should have regular mammograms. BRCA-related cancer screening. This may be done if you have a family history of breast, ovarian, tubal, or peritoneal cancers. Bone density scan. This is done to screen for osteoporosis. Talk with your health care provider about your test results, treatment options, and if necessary, the need for more tests. Follow these instructions at home: Eating and drinking  Eat a diet that includes fresh fruits and vegetables, whole grains, lean protein, and low-fat dairy products. Limit your intake of foods with high amounts of sugar, saturated fats, and salt. Take vitamin and mineral supplements as recommended by your health care provider. Do not drink alcohol  if your health care provider tells you not to drink. If you drink alcohol : Limit how much you have to 0-1 drink a day. Know how much alcohol  is in your drink. In the [place] one drink equals one 12 oz bottle of beer (355 mL), one 5 oz glass of wine (148 mL), or one 1 oz glass of hard liquor (44 mL). Lifestyle Brush your teeth every morning and night with fluoride toothpaste. Floss one time each day. Exercise for at least 30 minutes 5 or more days each week. Do not use any products that contain nicotine or tobacco. These products include cigarettes, chewing tobacco, and vaping devices, such as e-cigarettes. If you need help quitting, ask your health care provider. Do not use drugs. If you are sexually active, practice safe sex. Use a condom or other form of protection in order to prevent STIs. Take aspirin  only as told by  your health care provider. Make sure that you understand how much to take and what form to take. Work with your health care provider to find out whether it  is safe and beneficial for you to take aspirin  daily. Ask your health care provider if you need to take a cholesterol-lowering medicine (statin). Find healthy ways to manage stress, such as: Meditation, yoga, or listening to music. Journaling. Talking to a trusted person. Spending time with friends and family. Minimize exposure to UV radiation to reduce your risk of skin cancer. Safety Always wear your seat belt while driving or riding in a vehicle. Do not drive: If you have been drinking alcohol . Do not ride with someone who has been drinking. When you are tired or distracted. While texting. If you have been using any mind-altering substances or drugs. Wear a helmet and other protective equipment during sports activities. If you have firearms in your house, make sure you follow all gun safety procedures. What's next? Visit your health care provider once a year for an annual wellness visit. Ask your health care provider how often you should have your eyes and teeth checked. Stay up to date on all vaccines. This information is not intended to replace advice given to you by your health care provider. Make sure you discuss any questions you have with your health care provider. Document Revised: 04/10/2021 Document Reviewed: 04/10/2021 Elsevier Patient Education  2024 [employer]  How to Do a Breast Self-Exam Doing breast self-exams can help you stay healthy. They're one way to know what's normal for your breasts. They can help you catch a problem while it's still small and can be treated. You need to: Check your breasts often. Tell your doctor about any changes. You should do breast self-exams even if you have breast implants. What you need: A mirror. A well-lit room. A pillow or other soft object. How to do a breast self-exam Look for changes  Take off all the clothes above your waist. Stand in front of a mirror in a room with good lighting. Put your hands down at your  sides. Compare your breasts in the mirror. Look for difference between them, such as: Differences in shape. Differences in size. Wrinkles, dips, and bumps in one breast and not the other. Look at each breast for skin changes, such as: Redness. Scaly spots. Spots where your skin is thicker. Dimpling. Open sores. Look for changes in your nipples, such as: Fluid coming out of a nipple. Fluid around a nipple. Bleeding. Dimpling. Redness. A nipple that looks pushed in or that has changed position. Feel for changes Lie on your back. Feel each breast. To do this: Pick a breast to feel. Place a pillow under the shoulder closest to that breast. Put the arm closest to that breast behind your head. Feel the breast using the hand of your other arm. Use the pads of your three middle fingers to make small circles starting near the nipple. Use light, medium, and firm pressure. Keep making circles, moving down over the breast. Stop when you feel your ribs. Start making circles with your fingers again, this time going up until you reach your collarbone. Then, make circles out across your breast and into your armpit area. Squeeze your nipple. Check for fluid and lumps. Do these steps again to check your other breast. Sit or stand in the tub or shower. With soapy water  on your skin, feel each breast the same way you did when you were lying down.  Write down what you find Writing down what you find can help you keep track of what you want to tell your doctor. Write down: What's normal for each breast. Any changes you find. Write down: The kind of change. If your breast feels tender or painful. Any lump you find. Write down its size and where it is. When you last had your period. General tips If you're breastfeeding, the best time to check your breasts is after you feed your baby or after you use a breast pump. If you get a period, the best time to check your breasts is 5-7 days after your period  ends. With time, you'll get more used to doing the self-exam. You'll also start to know if there are changes in your breasts. Contact a doctor if: You see a change in the shape or size of your breasts or nipples. You see a change in the skin of your breast or nipples. You have fluid coming from your nipples that isn't normal. You find a new lump or thick area. You have breast pain. You have any concerns about your breast health. This information is not intended to replace advice given to you by your health care provider. Make sure you discuss any questions you have with your health care provider. Document Revised: 12/23/2023 Document Reviewed: 12/23/2023 Elsevier Patient Education  2025 [employer]. Health Maintenance, Female Adopting a healthy lifestyle and getting preventive care are important in promoting health and wellness. Ask your health care provider about: The right schedule for you to have regular tests and exams. Things you can do on your own to prevent diseases and keep yourself healthy. What should I know about diet, weight, and exercise? Eat a healthy diet  Eat a diet that includes plenty of vegetables, fruits, low-fat dairy products, and lean protein. Do not eat a lot of foods that are high in solid fats, added sugars, or sodium. Maintain a healthy weight Body mass index (BMI) is used to identify weight problems. It estimates body fat based on height and weight. Your health care provider can help determine your BMI and help you achieve or maintain a healthy weight. Get regular exercise Get regular exercise. This is one of the most important things you can do for your health. Most adults should: Exercise for at least 150 minutes each week. The exercise should increase your heart rate and make you sweat (moderate-intensity exercise). Do strengthening exercises at least twice a week. This is in addition to the moderate-intensity exercise. Spend less time sitting. Even light  physical activity can be beneficial. Watch cholesterol and blood lipids Have your blood tested for lipids and cholesterol at 69 years of age, then have this test every 5 years. Have your cholesterol levels checked more often if: Your lipid or cholesterol levels are high. You are older than 69 years of age. You are at high risk for heart disease. What should I know about cancer screening? Depending on your health history and family history, you may need to have cancer screening at various ages. This may include screening for: Breast cancer. Cervical cancer. Colorectal cancer. Skin cancer. Lung cancer. What should I know about heart disease, diabetes, and high blood pressure? Blood pressure and heart disease High blood pressure causes heart disease and increases the risk of stroke. This is more likely to develop in people who have high blood pressure readings or are overweight. Have your blood pressure checked: Every 3-5 years if you are 2-31 years of age. Every  year if you are 35 years old or older. Diabetes Have regular diabetes screenings. This checks your fasting blood sugar level. Have the screening done: Once every three years after age 12 if you are at a normal weight and have a low risk for diabetes. More often and at a younger age if you are overweight or have a high risk for diabetes. What should I know about preventing infection? Hepatitis B If you have a higher risk for hepatitis B, you should be screened for this virus. Talk with your health care provider to find out if you are at risk for hepatitis B infection. Hepatitis C Testing is recommended for: Everyone born from [birth year] through 1966. Anyone with known risk factors for hepatitis C. Sexually transmitted infections (STIs) Get screened for STIs, including gonorrhea and chlamydia, if: You are sexually active and are younger than 69 years of age. You are older than 69 years of age and your health care provider tells you  that you are at risk for this type of infection. Your sexual activity has changed since you were last screened, and you are at increased risk for chlamydia or gonorrhea. Ask your health care provider if you are at risk. Ask your health care provider about whether you are at high risk for HIV. Your health care provider may recommend a prescription medicine to help prevent HIV infection. If you choose to take medicine to prevent HIV, you should first get tested for HIV. You should then be tested every 3 months for as long as you are taking the medicine. Pregnancy If you are about to stop having your period (premenopausal) and you may become pregnant, seek counseling before you get pregnant. Take 400 to 800 micrograms (mcg) of folic acid  every day if you become pregnant. Ask for birth control (contraception) if you want to prevent pregnancy. Osteoporosis and menopause Osteoporosis is a disease in which the bones lose minerals and strength with aging. This can result in bone fractures. If you are 63 years old or older, or if you are at risk for osteoporosis and fractures, ask your health care provider if you should: Be screened for bone loss. Take a calcium or vitamin D  supplement to lower your risk of fractures. Be given hormone replacement therapy (HRT) to treat symptoms of menopause. Follow these instructions at home: Alcohol  use Do not drink alcohol  if: Your health care provider tells you not to drink. You are pregnant, may be pregnant, or are planning to become pregnant. If you drink alcohol : Limit how much you have to: 0-1 drink a day. Know how much alcohol  is in your drink. In the [place] one drink equals one 12 oz bottle of beer (355 mL), one 5 oz glass of wine (148 mL), or one 1 oz glass of hard liquor (44 mL). Lifestyle Do not use any products that contain nicotine or tobacco. These products include cigarettes, chewing tobacco, and vaping devices, such as e-cigarettes. If you need help  quitting, ask your health care provider. Do not use street drugs. Do not share needles. Ask your health care provider for help if you need support or information about quitting drugs. General instructions Schedule regular health, dental, and eye exams. Stay current with your vaccines. Tell your health care provider if: You often feel depressed. You have ever been abused or do not feel safe at home. Summary Adopting a healthy lifestyle and getting preventive care are important in promoting health and wellness. Follow your health care provider's instructions about  healthy diet, exercising, and getting tested or screened for diseases. Follow your health care provider's instructions on monitoring your cholesterol and blood pressure. This information is not intended to replace advice given to you by your health care provider. Make sure you discuss any questions you have with your health care provider. Document Revised: 03/04/2021 Document Reviewed: 03/04/2021 Elsevier Patient Education  2024 [employer].

## 2024-08-30 ENCOUNTER — Encounter: Payer: Self-pay | Admitting: Certified Nurse Midwife

## 2024-08-30 ENCOUNTER — Ambulatory Visit (INDEPENDENT_AMBULATORY_CARE_PROVIDER_SITE_OTHER): Admitting: Certified Nurse Midwife

## 2024-08-30 ENCOUNTER — Other Ambulatory Visit (HOSPITAL_COMMUNITY)
Admission: RE | Admit: 2024-08-30 | Discharge: 2024-08-30 | Disposition: A | Discharge status: Home / Self Care | Source: Ambulatory Visit | Attending: Certified Nurse Midwife | Admitting: Certified Nurse Midwife

## 2024-08-30 VITALS — BP 120/78 | HR 83 | Ht 65.0 in | Wt 185.8 lb

## 2024-08-30 DIAGNOSIS — Z1151 Encounter for screening for human papillomavirus (HPV): Secondary | ICD-10-CM | POA: Insufficient documentation

## 2024-08-30 DIAGNOSIS — R87610 Atypical squamous cells of undetermined significance on cytologic smear of cervix (ASC-US): Secondary | ICD-10-CM | POA: Diagnosis present

## 2024-08-30 DIAGNOSIS — Z79890 Hormone replacement therapy (postmenopausal): Secondary | ICD-10-CM

## 2024-08-30 DIAGNOSIS — Z124 Encounter for screening for malignant neoplasm of cervix: Secondary | ICD-10-CM | POA: Insufficient documentation

## 2024-08-30 DIAGNOSIS — Z01419 Encounter for gynecological examination (general) (routine) without abnormal findings: Secondary | ICD-10-CM

## 2024-08-30 DIAGNOSIS — Z90710 Acquired absence of both cervix and uterus: Secondary | ICD-10-CM

## 2024-08-30 MED ORDER — ESTRADIOL 10 MCG VA TABS: 10.0000 ug | ORAL_TABLET | VAGINAL | 3 refills | Status: AC

## 2024-08-30 MED ORDER — ESTRADIOL 0.5 MG PO TABS: 0.5000 mg | ORAL_TABLET | Freq: Every day | ORAL | 3 refills | Status: DC

## 2024-09-01 LAB — CYTOLOGY - PAP
Comment: NEGATIVE
Diagnosis: UNDETERMINED — AB
High risk HPV: NEGATIVE

## 2024-09-05 ENCOUNTER — Ambulatory Visit: Payer: Medicare HMO | Admitting: Internal Medicine

## 2024-09-05 ENCOUNTER — Encounter: Payer: Self-pay | Admitting: Internal Medicine

## 2024-09-05 VITALS — BP 123/78 | HR 73 | Temp 98.0°F | Resp 16 | Ht 65.0 in | Wt 185.0 lb

## 2024-09-05 DIAGNOSIS — J4489 Other specified chronic obstructive pulmonary disease: Secondary | ICD-10-CM

## 2024-09-05 DIAGNOSIS — R0602 Shortness of breath: Secondary | ICD-10-CM

## 2024-09-05 DIAGNOSIS — G4733 Obstructive sleep apnea (adult) (pediatric): Secondary | ICD-10-CM | POA: Diagnosis not present

## 2024-09-05 DIAGNOSIS — Z79899 Other long term (current) drug therapy: Secondary | ICD-10-CM | POA: Diagnosis not present

## 2024-09-05 NOTE — Patient Instructions (Signed)

## 2024-09-05 NOTE — Progress Notes (Signed)
 [facility] [address] [place], [place] [address]  Pulmonary Sleep Medicine   Office Visit Note  Patient Name: Susan [name] DOB: 1956-07-21 MRN [id number]  Date of Service: 09/05/2024  Complaints/HPI: She states she is doing fairly well. She does get shortness of breath on exertion. Todays spiro looks good and she has an FEV1 in the 82% range which is down from previous but not by much. She has no chest pain noted denies fevers or chills. As far as her OSA she has not been sleeping well. She does have a CPAP and tries to use it daily  Office Spirometry Results:     ROS  General: (-) fever, (-) chills, (-) night sweats, (-) weakness Skin: (-) rashes, (-) itching,. Eyes: (-) visual changes, (-) redness, (-) itching. Nose and Sinuses: (-) nasal stuffiness or itchiness, (-) postnasal drip, (-) nosebleeds, (-) sinus trouble. Mouth and Throat: (-) sore throat, (-) hoarseness. Neck: (-) swollen glands, (-) enlarged thyroid , (-) neck pain. Respiratory: - cough, (-) bloody sputum, - shortness of breath, - wheezing. Cardiovascular: - ankle swelling, (-) chest pain. Lymphatic: (-) lymph node enlargement. Neurologic: (-) numbness, (-) tingling. Psychiatric: (-) anxiety, (-) depression   Current Medication: Outpatient Encounter Medications as of 09/05/2024  Medication Sig   acetaminophen  (TYLENOL ) 500 MG tablet Take by mouth as needed.   albuterol  (PROVENTIL ) (2.5 MG/3ML) 0.083% nebulizer solution Take 3 mLs (2.5 mg total) by nebulization every 6 (six) hours as needed for wheezing or shortness of breath.   albuterol  (VENTOLIN  HFA) 108 (90 Base) MCG/ACT inhaler Inhale 2 puffs into the lungs every 6 (six) hours as needed for wheezing or shortness of breath.   ALPRAZolam  (XANAX ) 0.5 MG tablet TAKE 1 TABLET BY MOUTH THREE TIMES DAILY AS NEEDED FOR ANXIETY   [START ON 11/14/2024] amitriptyline  (ELAVIL ) 25 MG tablet Take 1 tablet (25 mg total) by mouth at bedtime.    antiseptic oral rinse (BIOTENE) LIQD 15 mLs by Mouth Rinse route as needed for dry mouth.   Azelastine  HCl 137 MCG/SPRAY SOLN USE 2 SPRAY(S) IN EACH NOSTRIL TWICE DAILY AS DIRECTED   carisoprodol  (SOMA ) 350 MG tablet Take 0.5 tablets (175 mg total) by mouth at bedtime.   clindamycin (CLEOCIN T) 1 % lotion Apply 1 Application topically at bedtime.   cyanocobalamin  (VITAMIN B12) 1000 MCG/ML injection Inject 1 mL (1,000 mcg total) into the muscle every 30 (thirty) days.   Dentifrices (BIOTENE DRY MOUTH DT) by Transmucosal route daily.   diphenhydrAMINE  HCl (BENADRYL  PO) Take 12.5 mg by mouth.   eletriptan  (RELPAX ) 40 MG tablet Take 1 tablet (40 mg total) by mouth daily as needed for migraine. may repeat in 2 hours if necessary   Estradiol  (YUVAFEM ) 10 MCG TABS vaginal tablet Place 1 tablet (10 mcg total) vaginally 2 (two) times a week.   ezetimibe  (ZETIA ) 10 MG tablet Take 1 tablet (10 mg total) by mouth daily.   fenofibrate  (TRICOR ) 145 MG tablet Take 1 tablet (145 mg total) by mouth daily.   fluticasone  (FLONASE ) 50 MCG/ACT nasal spray Place 2 sprays into both nostrils daily.   fluticasone  furoate-vilanterol (BREO ELLIPTA ) 100-25 MCG/ACT AEPB Inhale 1 puff into the lungs daily.   folic acid  (FOLVITE ) 1 MG tablet Take 1 tablet (1 mg total) by mouth daily.   gabapentin  (NEURONTIN ) 600 MG tablet Take 1/2 (one-half) tablet by mouth daily   HYDROcodone -acetaminophen  (NORCO) 7.5-325 MG tablet One tab po tid for pain prn only   hydroxychloroquine  (PLAQUENIL ) 200 MG  tablet Take 1 tablet by mouth twice daily   hyoscyamine  (ANASPAZ ) 0.125 MG TBDP disintergrating tablet Place 0.125 mg under the tongue every 4 (four) hours as needed for bladder spasms.    Lactobacillus (DIGESTIVE HEALTH PROBIOTIC PO) Take 1 tablet by mouth daily.    levothyroxine  (SYNTHROID ) 137 MCG tablet TAKE 1 TABLET BY MOUTH ONCE DAILY BEFORE BREAKFAST. STOP 125 MCG DOSE   LINZESS  290 MCG CAPS capsule Take 1 capsule (290 mcg total) by  mouth daily.   Magnesium  Oxide (NATRUL MAGNESIUM  PO) Take by mouth.    montelukast  (SINGULAIR ) 10 MG tablet TAKE 1 TABLET BY MOUTH ONCE DAILY FOR ASTHMA   Multiple Vitamins-Minerals (ZINC PO) Take by mouth daily.   mupirocin  ointment (BACTROBAN ) 2 % Apply topically as needed (dermatitis).   pantoprazole  (PROTONIX ) 40 MG tablet Take 40 mg by mouth daily.    Polyethyl Glycol-Propyl Glycol 0.4-0.3 % SOLN Apply to eye.    Propylene Glycol (SYSTANE COMPLETE OP) Apply 1 drop to eye as needed (dry eyes).   sodium chloride  (OCEAN) 0.65 % SOLN nasal spray Place 1 spray into both nostrils as needed for congestion.    SUMAtriptan -naproxen  (TREXIMET ) 85-500 MG tablet Take 1 tablet by mouth daily as needed for migraine.   SYRINGE-NEEDLE, DISP, 3 ML (B-D 3CC LUER-LOK SYR 25GX5/8) 25G X 5/8 3 ML MISC USE 1 SYRINGE ONCE EVERY MONTH AS DIRECTED   triamcinolone  cream (KENALOG ) 0.5 % Apply 1 Application topically 3 (three) times daily.   valACYclovir  (VALTREX ) 1000 MG tablet 2 grams by mouth every 12 hours for 2 days at the onset of a cold sore   venlafaxine  XR (EFFEXOR -XR) 150 MG 24 hr capsule Take 2 capsules (300 mg total) by mouth at bedtime.   Vitamin D , Ergocalciferol , (DRISDOL ) 1.25 MG (50000 UNIT) CAPS capsule Take 1 capsule by mouth once a week   vitamin E 1000 UNIT capsule Take by mouth.    [DISCONTINUED] estradiol  (ESTRACE ) 0.5 MG tablet Take 1 tablet (0.5 mg total) by mouth daily.   No facility-administered encounter medications on file as of 09/05/2024.    Surgical History: Past Surgical History:  Procedure Laterality Date   ABDOMINAL HYSTERECTOMY  2006   BLADDER REPAIR     BREAST BIOPSY Left 02/24/2024   BREAST CYST ASPIRATION Left yrs ago   CHOLECYSTECTOMY  1989   COLONOSCOPY N/A 10/16/2021   Procedure: COLONOSCOPY;  Surgeon: Dessa Reyes ORN, MD;  Location: [facility] ENDOSCOPY;  Service: Endoscopy;  Laterality: N/A;   COLONOSCOPY WITH ESOPHAGOGASTRODUODENOSCOPY (EGD)     COLONOSCOPY WITH  PROPOFOL  N/A 08/03/2017   Procedure: COLONOSCOPY WITH PROPOFOL ;  Surgeon: Gaylyn Gladis PENNER, MD;  Location: [facility] ENDOSCOPY;  Service: Endoscopy;  Laterality: N/A;   COLONOSCOPY WITH PROPOFOL  N/A 10/18/2018   Procedure: COLONOSCOPY WITH PROPOFOL ;  Surgeon: Gaylyn Gladis PENNER, MD;  Location: [facility] ENDOSCOPY;  Service: Endoscopy;  Laterality: N/A;   ESOPHAGOGASTRODUODENOSCOPY N/A 10/07/2013   Procedure: ESOPHAGOGASTRODUODENOSCOPY (EGD);  Surgeon: Margo LITTIE Haddock, MD;  Location: [facility] ENDO SUITE;  Service: Endoscopy;  Laterality: N/A;  patient received heparin  at 530am given phenergan  25mg  IV 30 minutes before   ESOPHAGOGASTRODUODENOSCOPY     x4   EYE SURGERY     lacrimal gland    FLEXIBLE BRONCHOSCOPY N/A 06/04/2016   Procedure: FLEXIBLE BRONCHOSCOPY;  Surgeon: Elfreda DELENA Bathe, MD;  Location: [facility] ORS;  Service: Pulmonary;  Laterality: N/A;   INCONTINENCE SURGERY  2004   NASAL SINUS SURGERY     THYROID  LOBECTOMY     TOTAL THYROIDECTOMY  Medical History: Past Medical History:  Diagnosis Date   Arthritis    Asthma    Asthmatic bronchitis    BRCA negative 05/2014   MyRisk neg   Celiac disease    Celiac sprue    Chronic anxiety    Complication of anesthesia    vomiting   COPD (chronic obstructive pulmonary disease) (HCC)    Depression    Environmental allergies    Family history of breast cancer 05/2014   MyRisk neg; IBIS=29.5%   Fibromyalgia    neuropathy all over   Gastritis 10/08/2013   GERD (gastroesophageal reflux disease)    Hyperlipemia    Hyperlipidemia    Intolerant to statins   Hypothyroidism (acquired)    Increased risk of breast cancer 05/2014   IBIS=29.5%   Increased risk of breast cancer 05/2014   IBIS=29.5%   Migraine    Migraines    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     Family History: Family History  Problem  Relation Age of Onset   Arthritis/Rheumatoid Mother        died in age 1s   Breast cancer Mother 26   Breast cancer Maternal Aunt        X 2. 50's   Breast cancer Maternal Aunt    Lung cancer Maternal Aunt    Breast cancer Maternal Aunt    Breast cancer Maternal Grandmother        92's   Pancreatitis Neg Hx    Colon cancer Neg Hx     Social History: Social History   Socioeconomic History   Marital status: Married    Spouse name: Not on file   Number of children: 0   Years of education: Not on file   Highest education level: Not on file  Occupational History   Not on file  Tobacco Use   Smoking status: Never    Passive exposure: Past   Smokeless tobacco: Never  Vaping Use   Vaping status: Never Used  Substance and Sexual Activity   Alcohol  use: No   Drug use: No   Sexual activity: Not Currently    Birth control/protection: Surgical  Other Topics Concern   Not on file  Social History Narrative   Not on file   Social Drivers of Health   Financial Resource Strain: Low Risk  (08/23/2024)   Received from [facility]   Overall Financial Resource Strain (CARDIA)    Difficulty of Paying Living Expenses: Not hard at all  Food Insecurity: No Food Insecurity (08/23/2024)   Received from [facility]   Hunger Vital Sign    Within the past 12 months, you worried that your food would run out before you got the money to buy more.: Never true    Within the past 12 months, the food you bought just didn't last and you didn't have money to get more.: Never true  Transportation Needs: No Transportation Needs (08/23/2024)   Received from [facility]    In the past 12 months, has lack of transportation kept you from medical appointments or from getting medications?: No    Lack of Transportation (Non-Medical): No  Physical Activity: Not on file  Stress: Not on file  Social Connections: Not on file   Intimate Partner Violence: Not  At Risk (06/12/2022)   Received from [facility]    Threatened: Not on file    Insulted: Not on file    Physically Hurt : Not on file    Scream: Not on file    Vital Signs: Blood pressure 123/78, pulse 73, temperature 98 F (36.7 C), resp. rate 16, height 5' 5 (1.651 m), weight 185 lb (83.9 kg), SpO2 97%.  Examination: General Appearance: The patient is well-developed, well-nourished, and in no distress. Skin: Gross inspection of skin unremarkable. Head: normocephalic, no gross deformities. Eyes: no gross deformities noted. ENT: ears appear grossly normal no exudates. Neck: Supple. No thyromegaly. No LAD. Respiratory: no rhonchi noted at this time. Cardiovascular: Normal S1 and S2 without murmur or rub. Extremities: No cyanosis. pulses are equal. Neurologic: Alert and oriented. No involuntary movements.  LABS: Recent Results (from the past 2160 hours)  Cytology - PAP     Status: Abnormal   Collection Time: 08/30/24  8:53 AM  Result Value Ref Range   High risk HPV Negative    Adequacy Satisfactory for evaluation.    Diagnosis (A)     - Atypical squamous cells of undetermined significance (ASC-US )   Comment Normal Reference Range HPV - Negative     Radiology: DG PAIN CLINIC C-ARM 1-60 MIN NO REPORT Result Date: 06/28/2024 Fluoro was used, but no Radiologist interpretation will be provided. Please refer to NOTES tab for provider progress note.   No results found.  No results found.  Assessment and Plan: Patient Active Problem List   Diagnosis Date Noted   Spinal column pain 06/28/2024   Osteoarthritis of carpometacarpal joint of thumb (Left) 06/20/2024   Arthritis of carpometacarpal [facility]) joint of thumb (Left) 06/20/2024   Osteoarthritis of hand (Right) 06/20/2024   Osteoarthritis of first (thumb) carpometacarpal joint of hand (Right) 06/20/2024   ANA positive 06/20/2024   Complaints of weakness of lower extremities  (2ry area of Pain) (Bilateral) 06/20/2024   Chronic ankle and foot pain (3ry area of Pain) (Right) 06/20/2024   Physical deconditioning 06/20/2024   DDD (degenerative disc disease), cervical 06/20/2024   Osteoarthritis involving multiple joints 06/20/2024   Lumbar interspinous bursitis 06/20/2024   B12 deficiency 05/18/2024   Folate deficiency 05/18/2024   Vitamin D  deficiency 05/18/2024   Chronic low back pain (1ry area of Pain) (Midline) w/o sciatica 05/11/2024   Chronic low back pain (Bilateral) w/o sciatica 05/11/2024   Low back pain of over 3 months duration 05/11/2024   Multifactorial low back pain 05/11/2024   Mechanical low back pain 05/11/2024   Lumbar facet joint pain (Bilateral) 05/11/2024   Chronic ankle pain (Right) 05/11/2024   Chronic hand pain (4th area of Pain) (Bilateral) 05/11/2024   Chronic pain syndrome 05/10/2024   Disorder of skeletal system 05/10/2024   Irritable bowel syndrome with constipation 12/09/2023   Abnormal mouth sensation 12/31/2022   Hyperlipidemia 07/04/2022   Polypharmacy 07/04/2022   Postoperative hypothyroidism 07/04/2022   Menopause 10/16/2020   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 06/20/2020   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   Essential tremor 06/20/2020   OSA (obstructive sleep apnea) 03/26/2020   Chronic wrist pain (Right) 11/02/2019   Chronic wrist pain (Left) 11/02/2019   Chronic pain in joints of hands (Right) 11/02/2019   Chronic pain in joints of hand (Left) 11/02/2019   Osteoarthritis of hands (Bilateral) 11/02/2019   MDD (major depressive disorder), recurrent, 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   Generalized anxiety disorder 02/28/2019   Chronic knee pain (Bilateral) 03/01/2018  Osteoarthritis of first (thumbs) carpometacarpal joints (Bilateral) 03/01/2018   Chronic obstructive pulmonary disease, unspecified (HCC) 12/04/2017   Allergic rhinitis, unspecified 12/04/2017   Vaginal atrophy 09/01/2017   Chronic low back pain (Right) w/o  sciatica 01/05/2017   Primary Sjogren's syndrome 01/05/2017   Celiac disease 04/17/2015   Substernal precordial chest pain 10/06/2013   Chronic anxiety 10/06/2013   Gastroesophageal reflux disease without esophagitis 10/06/2013   Chronic asthma 10/06/2013   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 OSA (obstructive sleep apnea) (Primary) She is on CPAP continue to encourage compliance with the CPAP.  She states she uses it nightly and tries to use it for as long as she is asleep  2. Obesity, morbid (HCC) She needs to work on diet exercise and weight loss.  She is also needs to work on portion control  3. Obstructive chronic bronchitis without exacerbation (HCC) Spoke to her about getting a follow-up pulmonary function to assess we did spirometry and looked at it today - Pulmonary function test; Future   General Counseling: I have discussed the findings of the evaluation and examination with Darice.  I have also discussed any further diagnostic evaluation thatmay be needed or ordered today. Keanna verbalizes understanding of the findings of todays visit. We also reviewed her medications today and discussed drug interactions and side effects including but not limited excessive drowsiness and altered mental states. We also discussed that there is always a risk not just to her but also people around her. she has been encouraged to call the office with any questions or concerns that should arise related to todays visit.  No orders of the defined types were placed in this encounter.    Time spent: 59  I have personally obtained a history, examined the patient, evaluated laboratory and imaging results, formulated the assessment and plan and placed orders.    Elfreda DELENA Bathe, MD [facility] Pulmonary and Critical Care Sleep medicine

## 2024-09-06 DIAGNOSIS — K581 Irritable bowel syndrome with constipation: Secondary | ICD-10-CM | POA: Diagnosis not present

## 2024-09-06 DIAGNOSIS — R1084 Generalized abdominal pain: Secondary | ICD-10-CM | POA: Diagnosis not present

## 2024-09-06 DIAGNOSIS — K219 Gastro-esophageal reflux disease without esophagitis: Secondary | ICD-10-CM | POA: Diagnosis not present

## 2024-09-07 ENCOUNTER — Other Ambulatory Visit: Payer: Self-pay | Admitting: Nurse Practitioner

## 2024-09-07 DIAGNOSIS — E538 Deficiency of other specified B group vitamins: Secondary | ICD-10-CM

## 2024-09-07 DIAGNOSIS — F411 Generalized anxiety disorder: Secondary | ICD-10-CM

## 2024-09-08 ENCOUNTER — Telehealth: Payer: Self-pay

## 2024-09-08 ENCOUNTER — Other Ambulatory Visit: Payer: Self-pay | Admitting: Psychiatry

## 2024-09-08 MED ORDER — VENLAFAXINE HCL ER 150 MG PO CP24: 300.0000 mg | ORAL_CAPSULE | Freq: Every day | ORAL | 1 refills | Status: AC

## 2024-09-08 NOTE — Telephone Encounter (Signed)
 Patient called to request a refill of venlafaxine  XR (EFFEXOR -XR) 150 MG 24 hr capsule   Last visit 08-10-24 Next visit 03-01-24    Preferred Pharmacies   [facility] [place], [place] - [address] [place] [address] Phone: [phone]  Fax: [phone]

## 2024-09-08 NOTE — Telephone Encounter (Signed)
 Ordered to start in Jan.

## 2024-09-11 ENCOUNTER — Ambulatory Visit: Payer: Self-pay | Admitting: Certified Nurse Midwife

## 2024-09-14 ENCOUNTER — Telehealth: Payer: Self-pay | Admitting: Psychiatry

## 2024-09-14 ENCOUNTER — Ambulatory Visit: Admitting: Nurse Practitioner

## 2024-09-14 ENCOUNTER — Encounter: Payer: Self-pay | Admitting: Nurse Practitioner

## 2024-09-14 VITALS — BP 120/76 | HR 76 | Temp 95.3°F | Resp 16 | Ht 65.0 in | Wt 185.4 lb

## 2024-09-14 DIAGNOSIS — E538 Deficiency of other specified B group vitamins: Secondary | ICD-10-CM

## 2024-09-14 DIAGNOSIS — H2513 Age-related nuclear cataract, bilateral: Secondary | ICD-10-CM | POA: Diagnosis not present

## 2024-09-14 DIAGNOSIS — E559 Vitamin D deficiency, unspecified: Secondary | ICD-10-CM

## 2024-09-14 DIAGNOSIS — E039 Hypothyroidism, unspecified: Secondary | ICD-10-CM | POA: Diagnosis not present

## 2024-09-14 DIAGNOSIS — M797 Fibromyalgia: Secondary | ICD-10-CM | POA: Diagnosis not present

## 2024-09-14 DIAGNOSIS — M3501 Sicca syndrome with keratoconjunctivitis: Secondary | ICD-10-CM | POA: Diagnosis not present

## 2024-09-14 DIAGNOSIS — Z79899 Other long term (current) drug therapy: Secondary | ICD-10-CM | POA: Diagnosis not present

## 2024-09-14 DIAGNOSIS — M3500 Sicca syndrome, unspecified: Secondary | ICD-10-CM | POA: Diagnosis not present

## 2024-09-14 DIAGNOSIS — F411 Generalized anxiety disorder: Secondary | ICD-10-CM | POA: Diagnosis not present

## 2024-09-14 DIAGNOSIS — E782 Mixed hyperlipidemia: Secondary | ICD-10-CM

## 2024-09-14 LAB — HM DIABETES EYE EXAM

## 2024-09-14 MED ORDER — HYDROCODONE-ACETAMINOPHEN 7.5-325 MG PO TABS: 1.0000 | ORAL_TABLET | Freq: Two times a day (BID) | ORAL | 0 refills | Status: AC | PRN

## 2024-09-14 MED ORDER — BD LUER-LOK SYRINGE 25G X 5/8" 3 ML MISC: 0 refills | Status: AC

## 2024-09-14 MED ORDER — CYANOCOBALAMIN 1000 MCG/ML IJ SOLN: 1000.0000 ug | INTRAMUSCULAR | 0 refills | Status: AC

## 2024-09-14 MED ORDER — ALPRAZOLAM 0.5 MG PO TABS: 0.5000 mg | ORAL_TABLET | Freq: Three times a day (TID) | ORAL | 2 refills | Status: AC | PRN

## 2024-09-14 MED ORDER — AREXVY 120 MCG/0.5ML IM SUSR: 0.5000 mL | Freq: Once | INTRAMUSCULAR | 0 refills | Status: AC

## 2024-09-14 NOTE — Progress Notes (Signed)
 [facility] [address] [place], [place] [address]  Internal MEDICINE  Office Visit Note  Patient Name: Susan [name]  [id number]  [id number]  Date of Service: 09/14/2024  Chief Complaint  Patient presents with   Depression   Gastroesophageal Reflux   Hyperlipidemia   Follow-up    HPI Susan [name] presents for a follow-up visit for medication refills, lab ordered, screenings and vaccinations. Anxiety -- takes alprazolam  as needed  Due for routine labs  Going out of town this weekend to stay in [place]  until may next year.  Vaccinations updated, also due for RSV vaccine  Takes B12 injections at home but has not been getting the needles to be able to administer the medication.  Takes prn hydrocodone  for her pain related to fibromyalgia. Her last fill of hydrocodone  was done in may this year  Has her mammogram scheduled for tomorrow.    Current Medication: Outpatient Encounter Medications as of 09/14/2024  Medication Sig   acetaminophen  (TYLENOL ) 500 MG tablet Take by mouth as needed.   albuterol  (PROVENTIL ) (2.5 MG/3ML) 0.083% nebulizer solution Take 3 mLs (2.5 mg total) by nebulization every 6 (six) hours as needed for wheezing or shortness of breath.   albuterol  (VENTOLIN  HFA) 108 (90 Base) MCG/ACT inhaler Inhale 2 puffs into the lungs every 6 (six) hours as needed for wheezing or shortness of breath.   ALPRAZolam  (XANAX ) 0.5 MG tablet Take 1 tablet (0.5 mg total) by mouth 3 (three) times daily as needed for anxiety.   [START ON 11/14/2024] amitriptyline  (ELAVIL ) 25 MG tablet Take 1 tablet (25 mg total) by mouth at bedtime.   antiseptic oral rinse (BIOTENE) LIQD 15 mLs by Mouth Rinse route as needed for dry mouth.   Azelastine  HCl 137 MCG/SPRAY SOLN USE 2 SPRAY(S) IN EACH NOSTRIL TWICE DAILY AS DIRECTED   carisoprodol  (SOMA ) 350 MG tablet Take 0.5 tablets (175 mg total) by mouth at bedtime.   clindamycin (CLEOCIN T) 1 % lotion Apply 1 Application topically at bedtime.    cyanocobalamin  (VITAMIN B12) 1000 MCG/ML injection Inject 1 mL (1,000 mcg total) into the muscle every 30 (thirty) days.   Dentifrices (BIOTENE DRY MOUTH DT) by Transmucosal route daily.   diphenhydrAMINE  HCl (BENADRYL  PO) Take 12.5 mg by mouth.   eletriptan  (RELPAX ) 40 MG tablet Take 1 tablet (40 mg total) by mouth daily as needed for migraine. may repeat in 2 hours if necessary   Estradiol  (YUVAFEM ) 10 MCG TABS vaginal tablet Place 1 tablet (10 mcg total) vaginally 2 (two) times a week.   ezetimibe  (ZETIA ) 10 MG tablet Take 1 tablet (10 mg total) by mouth daily.   fenofibrate  (TRICOR ) 145 MG tablet Take 1 tablet (145 mg total) by mouth daily.   fluticasone  (FLONASE ) 50 MCG/ACT nasal spray Place 2 sprays into both nostrils daily.   fluticasone  furoate-vilanterol (BREO ELLIPTA ) 100-25 MCG/ACT AEPB Inhale 1 puff into the lungs daily.   folic acid  (FOLVITE ) 1 MG tablet Take 1 tablet (1 mg total) by mouth daily.   gabapentin  (NEURONTIN ) 600 MG tablet Take 1/2 (one-half) tablet by mouth daily   HYDROcodone -acetaminophen  (NORCO) 7.5-325 MG tablet Take 1 tablet by mouth 2 (two) times daily as needed for moderate pain (pain score 4-6) or severe pain (pain score 7-10).   hydroxychloroquine  (PLAQUENIL ) 200 MG tablet Take 1 tablet by mouth twice daily   hyoscyamine  (ANASPAZ ) 0.125 MG TBDP disintergrating tablet Place 0.125 mg under the tongue every 4 (four) hours as needed for bladder spasms.  Lactobacillus (DIGESTIVE HEALTH PROBIOTIC PO) Take 1 tablet by mouth daily.    levothyroxine  (SYNTHROID ) 137 MCG tablet TAKE 1 TABLET BY MOUTH ONCE DAILY BEFORE BREAKFAST. STOP 125 MCG DOSE   LINZESS  290 MCG CAPS capsule Take 1 capsule (290 mcg total) by mouth daily.   montelukast  (SINGULAIR ) 10 MG tablet TAKE 1 TABLET BY MOUTH ONCE DAILY FOR ASTHMA   Multiple Vitamins-Minerals (ZINC PO) Take by mouth daily.   pantoprazole  (PROTONIX ) 40 MG tablet Take 40 mg by mouth daily.    Polyethyl Glycol-Propyl Glycol  0.4-0.3 % SOLN Apply to eye.    Propylene Glycol (SYSTANE COMPLETE OP) Apply 1 drop to eye as needed (dry eyes).   RSV vaccine recomb adjuvanted (AREXVY ) 120 MCG/0.5ML injection Inject 0.5 mLs into the muscle once for 1 dose.   sodium chloride  (OCEAN) 0.65 % SOLN nasal spray Place 1 spray into both nostrils as needed for congestion.    SUMAtriptan -naproxen  (TREXIMET ) 85-500 MG tablet Take 1 tablet by mouth daily as needed for migraine.   SYRINGE-NEEDLE, DISP, 3 ML (B-D 3CC LUER-LOK SYR 25GX5/8) 25G X 5/8 3 ML MISC USE 1 SYRINGE with needle to draw up the medication from the vial, change out the needle from a new syringe to administer the medication (2 syringe w/needle is needed per injection)   triamcinolone  cream (KENALOG ) 0.5 % Apply 1 Application topically 3 (three) times daily.   valACYclovir  (VALTREX ) 1000 MG tablet 2 grams by mouth every 12 hours for 2 days at the onset of a cold sore   venlafaxine  XR (EFFEXOR -XR) 150 MG 24 hr capsule Take 2 capsules (300 mg total) by mouth at bedtime.   [START ON 11/12/2024] venlafaxine  XR (EFFEXOR -XR) 150 MG 24 hr capsule Take 2 capsules (300 mg total) by mouth daily with breakfast.   Vitamin D , Ergocalciferol , (DRISDOL ) 1.25 MG (50000 UNIT) CAPS capsule Take 1 capsule by mouth once a week   vitamin E 1000 UNIT capsule Take by mouth.    [DISCONTINUED] ALPRAZolam  (XANAX ) 0.5 MG tablet Take 1 tablet (0.5 mg total) by mouth 3 (three) times daily as needed for anxiety.   [DISCONTINUED] B-D 3CC LUER-LOK SYR 25GX5/8 25G X 5/8 3 ML MISC USE 1 SYRINGE ONCE EVERY MONTH AS  DIRECTED   [DISCONTINUED] cyanocobalamin  (VITAMIN B12) 1000 MCG/ML injection INJECT 1 ML (CC) INTRAMUSCULARLY ONCE EVERY MONTH   [DISCONTINUED] HYDROcodone -acetaminophen  (NORCO) 7.5-325 MG tablet One tab po tid for pain prn only   No facility-administered encounter medications on file as of 09/14/2024.    Surgical History: Past Surgical History:  Procedure Laterality Date   ABDOMINAL  HYSTERECTOMY  2006   BLADDER REPAIR     BREAST BIOPSY Left 02/24/2024   BREAST CYST ASPIRATION Left yrs ago   CHOLECYSTECTOMY  1989   COLONOSCOPY N/A 10/16/2021   Procedure: COLONOSCOPY;  Surgeon: Dessa Reyes ORN, MD;  Location: [facility] ENDOSCOPY;  Service: Endoscopy;  Laterality: N/A;   COLONOSCOPY WITH ESOPHAGOGASTRODUODENOSCOPY (EGD)     COLONOSCOPY WITH PROPOFOL  N/A 08/03/2017   Procedure: COLONOSCOPY WITH PROPOFOL ;  Surgeon: Gaylyn Gladis PENNER, MD;  Location: [facility] ENDOSCOPY;  Service: Endoscopy;  Laterality: N/A;   COLONOSCOPY WITH PROPOFOL  N/A 10/18/2018   Procedure: COLONOSCOPY WITH PROPOFOL ;  Surgeon: Gaylyn Gladis PENNER, MD;  Location: [facility] ENDOSCOPY;  Service: Endoscopy;  Laterality: N/A;   ESOPHAGOGASTRODUODENOSCOPY N/A 10/07/2013   Procedure: ESOPHAGOGASTRODUODENOSCOPY (EGD);  Surgeon: Margo LITTIE Haddock, MD;  Location: [facility] ENDO SUITE;  Service: Endoscopy;  Laterality: N/A;  patient received heparin  at 530am given phenergan  25mg   IV 30 minutes before   ESOPHAGOGASTRODUODENOSCOPY     x4   EYE SURGERY     lacrimal gland    FLEXIBLE BRONCHOSCOPY N/A 06/04/2016   Procedure: FLEXIBLE BRONCHOSCOPY;  Surgeon: Elfreda DELENA Bathe, MD;  Location: [facility] ORS;  Service: Pulmonary;  Laterality: N/A;   INCONTINENCE SURGERY  2004   NASAL SINUS SURGERY     THYROID  LOBECTOMY     TOTAL THYROIDECTOMY      Medical History: Past Medical History:  Diagnosis Date   Arthritis    Asthma    Asthmatic bronchitis    BRCA negative 05/2014   MyRisk neg   Celiac disease    Celiac sprue    Chronic anxiety    Complication of anesthesia    vomiting   COPD (chronic obstructive pulmonary disease) (HCC)    Depression    Environmental allergies    Family history of breast cancer 05/2014   MyRisk neg; IBIS=29.5%   Fibromyalgia    neuropathy all over   Gastritis 10/08/2013   GERD (gastroesophageal reflux disease)    Hyperlipemia    Hyperlipidemia    Intolerant to statins   Hypothyroidism (acquired)     Increased risk of breast cancer 05/2014   IBIS=29.5%   Increased risk of breast cancer 05/2014   IBIS=29.5%   Migraine    Migraines    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     Family History: Family History  Problem Relation Age of Onset   Arthritis/Rheumatoid Mother        died in age 19s   Breast cancer Mother 22   Breast cancer Maternal Aunt        X 2. 50's   Breast cancer Maternal Aunt    Lung cancer Maternal Aunt    Breast cancer Maternal Aunt    Breast cancer Maternal Grandmother        79's   Pancreatitis Neg Hx    Colon cancer Neg Hx     Social History   Socioeconomic History   Marital status: Married    Spouse name: Not on file   Number of children: 0   Years of education: Not on file   Highest education level: Not on file  Occupational History   Not on file  Tobacco Use   Smoking status: Never    Passive exposure: Past   Smokeless tobacco: Never  Vaping Use   Vaping status: Never Used  Substance and Sexual Activity   Alcohol  use: No   Drug use: No   Sexual activity: Not Currently    Birth control/protection: Surgical  Other Topics Concern   Not on file  Social History Narrative   Not on file   Social Drivers of Health   Financial Resource Strain: Low Risk  (08/23/2024)   Received from [facility]   Overall Financial Resource Strain (CARDIA)    Difficulty of Paying Living Expenses: Not hard at all  Food Insecurity: No Food Insecurity (08/23/2024)   Received from [facility]   Hunger Vital Sign    Within the past 12 months, you worried that your food would run out before you got the money to buy more.: Never true    Within the past 12 months, the food you bought just didn't last and you didn't have money to get  more.: Never true  Transportation Needs: No Transportation Needs  (08/23/2024)   Received from [facility] - Transportation    In the past 12 months, has lack of transportation kept you from medical appointments or from getting medications?: No    Lack of Transportation (Non-Medical): No  Physical Activity: Not on file  Stress: Not on file  Social Connections: Not on file  Intimate Partner Violence: Not At Risk (06/12/2022)   Received from [facility]    Threatened: Not on file    Insulted: Not on file    Physically Hurt : Not on file    Scream: Not on file      Review of Systems  Constitutional:  Negative for chills, fatigue and unexpected weight change.  HENT:  Negative for congestion, rhinorrhea, sneezing and sore throat.   Eyes:  Negative for redness.  Respiratory: Negative.  Negative for cough, chest tightness, shortness of breath and wheezing.   Cardiovascular: Negative.  Negative for chest pain and palpitations.  Gastrointestinal: Negative.  Negative for abdominal pain, constipation, diarrhea, nausea and vomiting.  Genitourinary:  Negative for dysuria and frequency.  Musculoskeletal:  Negative for arthralgias, back pain, joint swelling and neck pain.  Skin:  Negative for rash.  Neurological:  Negative for dizziness, tremors, light-headedness and numbness.  Hematological:  Negative for adenopathy. Does not bruise/bleed easily.  Psychiatric/Behavioral:  Negative for behavioral problems (Depression), self-injury, sleep disturbance and suicidal ideas. The patient is nervous/anxious.     Vital Signs: BP 120/76   Pulse 76   Temp (!) 95.3 F (35.2 C)   Resp 16   Ht 5' 5 (1.651 m)   Wt 185 lb 6.4 oz (84.1 kg)   SpO2 97%   BMI 30.85 kg/m    Physical Exam Vitals reviewed.  Constitutional:      Appearance: Normal appearance. She is obese.  HENT:     Head: Normocephalic and atraumatic.  Eyes:     Pupils: Pupils are equal, round, and reactive to light.  Cardiovascular:     Rate and Rhythm:  Normal rate and regular rhythm.  Pulmonary:     Effort: Pulmonary effort is normal. No respiratory distress.  Neurological:     Mental Status: She is alert and oriented to person, place, and time.  Psychiatric:        Mood and Affect: Mood normal.        Behavior: Behavior normal.        Assessment/Plan: 1. Acquired hypothyroidism (Primary) Routine labs ordered. Continue levothyroxine  as prescribed.  - CBC with Differential/Platelet - CMP14+EGFR - Lipid Profile - TSH + free T4 - B12 and Folate Panel - Vitamin D  (25 hydroxy) - Iron, TIBC and Ferritin Panel  2. Mixed hyperlipidemia Routine labs ordered. Continue fenofibrate  as prescribed.  - CBC with Differential/Platelet - CMP14+EGFR - Lipid Profile - TSH + free T4 - B12 and Folate Panel - Vitamin D  (25 hydroxy) - Iron, TIBC and Ferritin Panel  3. Folate deficiency Routine labs ordered  - CBC with Differential/Platelet - CMP14+EGFR - Lipid Profile - TSH + free T4 - B12 and Folate Panel - Vitamin D  (25 hydroxy) - Iron, TIBC and Ferritin Panel  4. B12 deficiency Routine labs ordered. B12 injections ordered for patient to have done at home.  - CBC with Differential/Platelet - CMP14+EGFR - Lipid Profile - TSH + free T4 - B12 and Folate Panel - Vitamin D  (25 hydroxy) - Iron, TIBC and Ferritin  Panel - cyanocobalamin  (VITAMIN B12) 1000 MCG/ML injection; Inject 1 mL (1,000 mcg total) into the muscle every 30 (thirty) days.  Dispense: 3 mL; Refill: 0 - SYRINGE-NEEDLE, DISP, 3 ML (B-D 3CC LUER-LOK SYR 25GX5/8) 25G X 5/8 3 ML MISC; USE 1 SYRINGE with needle to draw up the medication from the vial, change out the needle from a new syringe to administer the medication (2 syringe w/needle is needed per injection)  Dispense: 6 each; Refill: 0  5. Vitamin D  deficiency Routine labs ordered  - CBC with Differential/Platelet - CMP14+EGFR - Lipid Profile - TSH + free T4 - B12 and Folate Panel - Vitamin D  (25 hydroxy) -  Iron, TIBC and Ferritin Panel  6. Sjogren's syndrome, with unspecified organ involvement Routine labs ordered  - CBC with Differential/Platelet - CMP14+EGFR - Lipid Profile - TSH + free T4 - B12 and Folate Panel - Vitamin D  (25 hydroxy) - Iron, TIBC and Ferritin Panel  7. Fibromyalgia Continue prn hydrocodone  as prescribed. Routine labs ordered  - CBC with Differential/Platelet - CMP14+EGFR - Lipid Profile - TSH + free T4 - B12 and Folate Panel - Vitamin D  (25 hydroxy) - Iron, TIBC and Ferritin Panel - HYDROcodone -acetaminophen  (NORCO) 7.5-325 MG tablet; Take 1 tablet by mouth 2 (two) times daily as needed for moderate pain (pain score 4-6) or severe pain (pain score 7-10).  Dispense: 60 tablet; Refill: 0  8. Generalized anxiety disorder Continue alprazolam  as prescribed. Follow up in 3 months for additional refills.  - ALPRAZolam  (XANAX ) 0.5 MG tablet; Take 1 tablet (0.5 mg total) by mouth 3 (three) times daily as needed for anxiety.  Dispense: 90 tablet; Refill: 2   General Counseling: Susan [name] verbalizes understanding of the findings of todays visit and agrees with plan of treatment. I have discussed any further diagnostic evaluation that may be needed or ordered today. We also reviewed her medications today. she has been encouraged to call the office with any questions or concerns that should arise related to todays visit.    Orders Placed This Encounter  Procedures   CBC with Differential/Platelet   CMP14+EGFR   Lipid Profile   TSH + free T4   B12 and Folate Panel   Vitamin D  (25 hydroxy)   Iron, TIBC and Ferritin Panel    Meds ordered this encounter  Medications   RSV vaccine recomb adjuvanted (AREXVY ) 120 MCG/0.5ML injection    Sig: Inject 0.5 mLs into the muscle once for 1 dose.    Dispense:  0.5 mL    Refill:  0    Due for RSV vaccine, please fax us  the vaccine after this is given at [phone].   cyanocobalamin  (VITAMIN B12) 1000 MCG/ML injection    Sig:  Inject 1 mL (1,000 mcg total) into the muscle every 30 (thirty) days.    Dispense:  3 mL    Refill:  0   SYRINGE-NEEDLE, DISP, 3 ML (B-D 3CC LUER-LOK SYR 25GX5/8) 25G X 5/8 3 ML MISC    Sig: USE 1 SYRINGE with needle to draw up the medication from the vial, change out the needle from a new syringe to administer the medication (2 syringe w/needle is needed per injection)    Dispense:  6 each    Refill:  0    Please fill script with the B12 vials.   ALPRAZolam  (XANAX ) 0.5 MG tablet    Sig: Take 1 tablet (0.5 mg total) by mouth 3 (three) times daily as needed for anxiety.  Dispense:  90 tablet    Refill:  2    For future refills, keep on file   HYDROcodone -acetaminophen  (NORCO) 7.5-325 MG tablet    Sig: Take 1 tablet by mouth 2 (two) times daily as needed for moderate pain (pain score 4-6) or severe pain (pain score 7-10).    Dispense:  60 tablet    Refill:  0    Refill for chronic pain/fibromyalgia    Return for previously scheduled, AWV, Susan [name] PCP next year. will send message or call with labs, out of town.   Total time spent:30 Minutes Time spent includes review of chart, medications, test results, and follow up plan with the patient.   [place] Controlled Substance Database was reviewed by me.  This patient was seen by Susan Maxin, Susan [name] in collaboration with Dr. Sigrid [name] as a part of collaborative care agreement.   Susan Gradillas R. Maxin, MSN, Susan [name] Internal medicine

## 2024-09-15 ENCOUNTER — Ambulatory Visit
Admission: RE | Admit: 2024-09-15 | Discharge: 2024-09-15 | Disposition: A | Discharge status: Home / Self Care | Source: Ambulatory Visit | Attending: Obstetrics & Gynecology | Admitting: Obstetrics & Gynecology

## 2024-09-15 DIAGNOSIS — Z1231 Encounter for screening mammogram for malignant neoplasm of breast: Secondary | ICD-10-CM | POA: Insufficient documentation

## 2024-09-17 LAB — CMP14+EGFR
ALT: 24 IU/L (ref 0–32)
AST: 20 IU/L (ref 0–40)
Albumin: 4.7 g/dL (ref 3.9–4.9)
Alkaline Phosphatase: 72 IU/L (ref 49–135)
BUN/Creatinine Ratio: 18 (ref 12–28)
BUN: 18 mg/dL (ref 8–27)
Bilirubin Total: 0.3 mg/dL (ref 0.0–1.2)
CO2: 22 mmol/L (ref 20–29)
Calcium: 10.1 mg/dL (ref 8.7–10.3)
Chloride: 105 mmol/L (ref 96–106)
Creatinine, Ser: 0.98 mg/dL (ref 0.57–1.00)
Globulin, Total: 2.8 g/dL (ref 1.5–4.5)
Glucose: 93 mg/dL (ref 70–99)
Potassium: 4.7 mmol/L (ref 3.5–5.2)
Sodium: 142 mmol/L (ref 134–144)
Total Protein: 7.5 g/dL (ref 6.0–8.5)
eGFR: 62 mL/min/1.73 (ref >59)

## 2024-09-17 LAB — IRON,TIBC AND FERRITIN PANEL
Ferritin: 127 ng/mL (ref 15–150)
Iron Saturation: 18 % (ref 15–55)
Iron: 76 ug/dL (ref 27–139)
Total Iron Binding Capacity: 412 ug/dL (ref 250–450)
UIBC: 336 ug/dL (ref 118–369)

## 2024-09-17 LAB — LIPID PANEL
Chol/HDL Ratio: 2.9 ratio (ref 0.0–4.4)
Cholesterol, Total: 162 mg/dL (ref 100–199)
HDL: 56 mg/dL (ref >39)
LDL Chol Calc (NIH): 90 mg/dL (ref 0–99)
Triglycerides: 87 mg/dL (ref 0–149)
VLDL Cholesterol Cal: 16 mg/dL (ref 5–40)

## 2024-09-17 LAB — CBC WITH DIFFERENTIAL/PLATELET
Basophils Absolute: 0 x10E3/uL (ref 0.0–0.2)
Basos: 1 %
EOS (ABSOLUTE): 0.2 x10E3/uL (ref 0.0–0.4)
Eos: 4 %
Hematocrit: 39.4 % (ref 34.0–46.6)
Hemoglobin: 12.9 g/dL (ref 11.1–15.9)
Immature Grans (Abs): 0 x10E3/uL (ref 0.0–0.1)
Immature Granulocytes: 0 %
Lymphocytes Absolute: 1.7 x10E3/uL (ref 0.7–3.1)
Lymphs: 31 %
MCH: 30.2 pg (ref 26.6–33.0)
MCHC: 32.7 g/dL (ref 31.5–35.7)
MCV: 92 fL (ref 79–97)
Monocytes Absolute: 0.5 x10E3/uL (ref 0.1–0.9)
Monocytes: 9 %
Neutrophils Absolute: 3 x10E3/uL (ref 1.4–7.0)
Neutrophils: 55 %
Platelets: 335 x10E3/uL (ref 150–450)
RBC: 4.27 x10E6/uL (ref 3.77–5.28)
RDW: 12.9 % (ref 11.7–15.4)
WBC: 5.4 x10E3/uL (ref 3.4–10.8)

## 2024-09-17 LAB — B12 AND FOLATE PANEL
Folate: >20 ng/mL (ref >3.0)
Vitamin B-12: 1099 pg/mL (ref 232–1245)

## 2024-09-17 LAB — VITAMIN D 25 HYDROXY (VIT D DEFICIENCY, FRACTURES): Vit D, 25-Hydroxy: 38.1 ng/mL (ref 30.0–100.0)

## 2024-09-17 LAB — TSH+FREE T4
Free T4: 1.18 ng/dL (ref 0.82–1.77)
TSH: 0.43 u[IU]/mL — ABNORMAL LOW (ref 0.450–4.500)

## 2024-09-21 ENCOUNTER — Encounter: Payer: Self-pay | Admitting: Nurse Practitioner

## 2024-09-21 ENCOUNTER — Telehealth: Payer: Self-pay | Admitting: Psychiatry

## 2024-10-03 ENCOUNTER — Ambulatory Visit: Admitting: Internal Medicine

## 2024-10-05 ENCOUNTER — Encounter: Admitting: Internal Medicine

## 2024-10-11 ENCOUNTER — Other Ambulatory Visit: Payer: Self-pay | Admitting: Nurse Practitioner

## 2024-10-11 DIAGNOSIS — Z760 Encounter for issue of repeat prescription: Secondary | ICD-10-CM

## 2024-10-12 DIAGNOSIS — R928 Other abnormal and inconclusive findings on diagnostic imaging of breast: Secondary | ICD-10-CM | POA: Diagnosis not present

## 2024-10-12 DIAGNOSIS — R92323 Mammographic fibroglandular density, bilateral breasts: Secondary | ICD-10-CM | POA: Diagnosis not present

## 2024-10-12 DIAGNOSIS — R921 Mammographic calcification found on diagnostic imaging of breast: Secondary | ICD-10-CM | POA: Diagnosis not present

## 2025-03-01 ENCOUNTER — Telehealth: Admitting: Psychiatry

## 2025-03-08 ENCOUNTER — Encounter: Admitting: Internal Medicine

## 2025-03-13 ENCOUNTER — Ambulatory Visit: Admitting: Internal Medicine

## 2025-04-17 ENCOUNTER — Ambulatory Visit: Admitting: Nurse Practitioner
